# Patient Record
Sex: Female | Born: 1985 | Race: Black or African American | Hispanic: No | Marital: Single | Smoking: Never smoker
Health system: Southern US, Community
[De-identification: ages and names within clinical notes are randomized; demographics above are authoritative.]

## PROBLEM LIST (undated history)

## (undated) DIAGNOSIS — G40812 Lennox-Gastaut syndrome, not intractable, without status epilepticus: Secondary | ICD-10-CM

## (undated) DIAGNOSIS — G809 Cerebral palsy, unspecified: Secondary | ICD-10-CM

## (undated) DIAGNOSIS — IMO0001 Reserved for inherently not codable concepts without codable children: Secondary | ICD-10-CM

## (undated) DIAGNOSIS — R569 Unspecified convulsions: Secondary | ICD-10-CM

## (undated) DIAGNOSIS — F73 Profound intellectual disabilities: Secondary | ICD-10-CM

## (undated) DIAGNOSIS — K219 Gastro-esophageal reflux disease without esophagitis: Secondary | ICD-10-CM

## (undated) DIAGNOSIS — E039 Hypothyroidism, unspecified: Secondary | ICD-10-CM

## (undated) DIAGNOSIS — J189 Pneumonia, unspecified organism: Secondary | ICD-10-CM

## (undated) DIAGNOSIS — E550 Rickets, active: Secondary | ICD-10-CM

## (undated) HISTORY — DX: Lennox-Gastaut syndrome, not intractable, without status epilepticus: G40.812

## (undated) HISTORY — PX: FOOT MASS EXCISION: SHX1663

## (undated) HISTORY — PX: PEG TUBE PLACEMENT: SUR1034

## (undated) HISTORY — DX: Hypothyroidism, unspecified: E03.9

## (undated) HISTORY — DX: Gastro-esophageal reflux disease without esophagitis: K21.9

## (undated) HISTORY — PX: PEG TUBE REMOVAL: SHX2187

## (undated) HISTORY — DX: Cerebral palsy, unspecified: G80.9

## (undated) HISTORY — DX: Reserved for inherently not codable concepts without codable children: IMO0001

## (undated) HISTORY — DX: Rickets, active: E55.0

## (undated) HISTORY — DX: Unspecified convulsions: R56.9

## (undated) HISTORY — DX: Profound intellectual disabilities: F73

---
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
## 2006-01-10 ENCOUNTER — Ambulatory Visit: Payer: Self-pay | Admitting: Surgery

## 2006-02-20 ENCOUNTER — Ambulatory Visit: Payer: Self-pay | Admitting: Surgery

## 2006-02-21 ENCOUNTER — Ambulatory Visit (HOSPITAL_COMMUNITY): Admission: RE | Admit: 2006-02-21 | Discharge: 2006-02-21 | Payer: Self-pay | Admitting: Surgery

## 2006-02-21 ENCOUNTER — Encounter (INDEPENDENT_AMBULATORY_CARE_PROVIDER_SITE_OTHER): Payer: Self-pay | Admitting: Specialist

## 2006-03-07 ENCOUNTER — Ambulatory Visit: Payer: Self-pay | Admitting: Surgery

## 2006-03-16 ENCOUNTER — Emergency Department (HOSPITAL_COMMUNITY): Admission: EM | Admit: 2006-03-16 | Discharge: 2006-03-16 | Payer: Self-pay | Admitting: Emergency Medicine

## 2006-03-21 ENCOUNTER — Ambulatory Visit: Payer: Self-pay | Admitting: Surgery

## 2006-04-19 ENCOUNTER — Ambulatory Visit: Payer: Self-pay | Admitting: Surgery

## 2006-04-23 ENCOUNTER — Ambulatory Visit (HOSPITAL_COMMUNITY): Admission: RE | Admit: 2006-04-23 | Discharge: 2006-04-23 | Payer: Self-pay | Admitting: Surgery

## 2006-05-15 ENCOUNTER — Emergency Department (HOSPITAL_COMMUNITY): Admission: EM | Admit: 2006-05-15 | Discharge: 2006-05-15 | Payer: Self-pay | Admitting: Emergency Medicine

## 2006-05-16 ENCOUNTER — Ambulatory Visit: Payer: Self-pay | Admitting: Surgery

## 2006-05-18 ENCOUNTER — Emergency Department (HOSPITAL_COMMUNITY): Admission: EM | Admit: 2006-05-18 | Discharge: 2006-05-18 | Payer: Self-pay | Admitting: Family Medicine

## 2006-07-03 ENCOUNTER — Emergency Department (HOSPITAL_COMMUNITY): Admission: EM | Admit: 2006-07-03 | Discharge: 2006-07-03 | Payer: Self-pay | Admitting: Emergency Medicine

## 2006-07-26 ENCOUNTER — Emergency Department (HOSPITAL_COMMUNITY): Admission: EM | Admit: 2006-07-26 | Discharge: 2006-07-26 | Payer: Self-pay | Admitting: Emergency Medicine

## 2006-08-03 ENCOUNTER — Emergency Department (HOSPITAL_COMMUNITY): Admission: EM | Admit: 2006-08-03 | Discharge: 2006-08-03 | Payer: Self-pay | Admitting: Emergency Medicine

## 2006-08-05 ENCOUNTER — Emergency Department (HOSPITAL_COMMUNITY): Admission: EM | Admit: 2006-08-05 | Discharge: 2006-08-05 | Payer: Self-pay | Admitting: Emergency Medicine

## 2006-08-07 ENCOUNTER — Ambulatory Visit (HOSPITAL_COMMUNITY): Admission: RE | Admit: 2006-08-07 | Discharge: 2006-08-07 | Payer: Self-pay | Admitting: General Surgery

## 2006-10-23 ENCOUNTER — Ambulatory Visit: Payer: Self-pay | Admitting: General Surgery

## 2006-10-30 ENCOUNTER — Ambulatory Visit: Payer: Self-pay | Admitting: General Surgery

## 2007-02-12 ENCOUNTER — Emergency Department (HOSPITAL_COMMUNITY): Admission: EM | Admit: 2007-02-12 | Discharge: 2007-02-12 | Payer: Self-pay | Admitting: *Deleted

## 2007-04-16 ENCOUNTER — Ambulatory Visit: Payer: Self-pay | Admitting: General Surgery

## 2007-04-17 ENCOUNTER — Emergency Department (HOSPITAL_COMMUNITY): Admission: EM | Admit: 2007-04-17 | Discharge: 2007-04-17 | Payer: Self-pay | Admitting: Emergency Medicine

## 2007-08-12 ENCOUNTER — Emergency Department (HOSPITAL_COMMUNITY): Admission: EM | Admit: 2007-08-12 | Discharge: 2007-08-12 | Payer: Self-pay | Admitting: Emergency Medicine

## 2008-02-13 ENCOUNTER — Emergency Department (HOSPITAL_COMMUNITY): Admission: EM | Admit: 2008-02-13 | Discharge: 2008-02-13 | Payer: Self-pay | Admitting: Emergency Medicine

## 2008-06-01 ENCOUNTER — Emergency Department (HOSPITAL_COMMUNITY): Admission: EM | Admit: 2008-06-01 | Discharge: 2008-06-01 | Payer: Self-pay | Admitting: Emergency Medicine

## 2008-06-22 ENCOUNTER — Ambulatory Visit: Payer: Self-pay | Admitting: General Surgery

## 2008-06-23 ENCOUNTER — Encounter: Payer: Self-pay | Admitting: Family Medicine

## 2008-07-14 ENCOUNTER — Emergency Department (HOSPITAL_COMMUNITY): Admission: EM | Admit: 2008-07-14 | Discharge: 2008-07-14 | Payer: Self-pay | Admitting: Emergency Medicine

## 2009-01-19 ENCOUNTER — Encounter: Payer: Self-pay | Admitting: Family Medicine

## 2009-01-29 ENCOUNTER — Ambulatory Visit: Payer: Self-pay | Admitting: Family Medicine

## 2009-01-29 DIAGNOSIS — F73 Profound intellectual disabilities: Secondary | ICD-10-CM | POA: Insufficient documentation

## 2009-01-29 DIAGNOSIS — K219 Gastro-esophageal reflux disease without esophagitis: Secondary | ICD-10-CM | POA: Insufficient documentation

## 2009-01-29 DIAGNOSIS — G40909 Epilepsy, unspecified, not intractable, without status epilepticus: Secondary | ICD-10-CM

## 2009-01-29 DIAGNOSIS — G809 Cerebral palsy, unspecified: Secondary | ICD-10-CM

## 2009-01-29 DIAGNOSIS — E039 Hypothyroidism, unspecified: Secondary | ICD-10-CM

## 2009-01-29 DIAGNOSIS — M810 Age-related osteoporosis without current pathological fracture: Secondary | ICD-10-CM

## 2009-02-04 ENCOUNTER — Encounter: Payer: Self-pay | Admitting: Family Medicine

## 2009-03-18 ENCOUNTER — Telehealth: Payer: Self-pay | Admitting: Family Medicine

## 2009-03-19 ENCOUNTER — Encounter: Payer: Self-pay | Admitting: Family Medicine

## 2009-03-23 ENCOUNTER — Emergency Department (HOSPITAL_COMMUNITY): Admission: EM | Admit: 2009-03-23 | Discharge: 2009-03-23 | Payer: Self-pay | Admitting: Family Medicine

## 2009-03-23 ENCOUNTER — Emergency Department (HOSPITAL_COMMUNITY): Admission: EM | Admit: 2009-03-23 | Discharge: 2009-03-24 | Payer: Self-pay | Admitting: Emergency Medicine

## 2009-03-24 ENCOUNTER — Encounter: Payer: Self-pay | Admitting: *Deleted

## 2009-03-29 ENCOUNTER — Ambulatory Visit: Payer: Self-pay | Admitting: General Surgery

## 2009-03-31 ENCOUNTER — Encounter: Payer: Self-pay | Admitting: *Deleted

## 2009-04-24 ENCOUNTER — Emergency Department (HOSPITAL_COMMUNITY): Admission: EM | Admit: 2009-04-24 | Discharge: 2009-04-24 | Payer: Self-pay | Admitting: Emergency Medicine

## 2009-04-28 ENCOUNTER — Ambulatory Visit: Payer: Self-pay | Admitting: Family Medicine

## 2009-04-28 ENCOUNTER — Encounter: Payer: Self-pay | Admitting: Family Medicine

## 2009-04-28 LAB — CONVERTED CEMR LAB: TSH: 0.854 microintl units/mL (ref 0.350–4.500)

## 2009-04-29 ENCOUNTER — Telehealth: Payer: Self-pay | Admitting: Family Medicine

## 2009-05-17 ENCOUNTER — Encounter: Payer: Self-pay | Admitting: Family Medicine

## 2009-05-17 ENCOUNTER — Ambulatory Visit: Payer: Self-pay | Admitting: Family Medicine

## 2009-05-20 ENCOUNTER — Telehealth: Payer: Self-pay | Admitting: Family Medicine

## 2009-05-25 ENCOUNTER — Encounter: Payer: Self-pay | Admitting: Family Medicine

## 2009-05-26 ENCOUNTER — Ambulatory Visit: Payer: Self-pay | Admitting: Family Medicine

## 2009-05-26 ENCOUNTER — Encounter: Payer: Self-pay | Admitting: Family Medicine

## 2009-05-26 ENCOUNTER — Encounter (INDEPENDENT_AMBULATORY_CARE_PROVIDER_SITE_OTHER): Payer: Self-pay | Admitting: *Deleted

## 2009-07-22 ENCOUNTER — Ambulatory Visit: Payer: Self-pay | Admitting: Family Medicine

## 2009-07-22 ENCOUNTER — Encounter: Payer: Self-pay | Admitting: Family Medicine

## 2009-07-23 LAB — CONVERTED CEMR LAB: TSH: 0.803 microintl units/mL (ref 0.350–4.500)

## 2009-09-10 ENCOUNTER — Encounter: Payer: Self-pay | Admitting: Family Medicine

## 2009-09-20 ENCOUNTER — Emergency Department (HOSPITAL_COMMUNITY): Admission: EM | Admit: 2009-09-20 | Discharge: 2009-09-21 | Payer: Self-pay | Admitting: Emergency Medicine

## 2009-09-20 ENCOUNTER — Encounter: Payer: Self-pay | Admitting: Family Medicine

## 2009-09-20 ENCOUNTER — Encounter: Payer: Self-pay | Admitting: *Deleted

## 2009-09-20 ENCOUNTER — Ambulatory Visit: Payer: Self-pay | Admitting: Family Medicine

## 2009-09-21 ENCOUNTER — Telehealth: Payer: Self-pay | Admitting: Family Medicine

## 2009-09-21 ENCOUNTER — Ambulatory Visit: Payer: Self-pay | Admitting: Family Medicine

## 2009-09-21 ENCOUNTER — Encounter: Payer: Self-pay | Admitting: Family Medicine

## 2009-09-23 ENCOUNTER — Encounter: Payer: Self-pay | Admitting: Family Medicine

## 2009-09-23 ENCOUNTER — Ambulatory Visit: Payer: Self-pay | Admitting: Family Medicine

## 2009-09-27 ENCOUNTER — Encounter: Admission: RE | Admit: 2009-09-27 | Discharge: 2009-09-27 | Payer: Self-pay | Admitting: General Surgery

## 2009-09-27 ENCOUNTER — Ambulatory Visit: Payer: Self-pay | Admitting: General Surgery

## 2009-10-04 ENCOUNTER — Encounter: Payer: Self-pay | Admitting: Family Medicine

## 2009-10-05 ENCOUNTER — Encounter: Payer: Self-pay | Admitting: Family Medicine

## 2009-10-23 ENCOUNTER — Emergency Department (HOSPITAL_COMMUNITY): Admission: EM | Admit: 2009-10-23 | Discharge: 2009-10-23 | Payer: Self-pay | Admitting: Emergency Medicine

## 2009-10-25 ENCOUNTER — Emergency Department (HOSPITAL_COMMUNITY): Admission: EM | Admit: 2009-10-25 | Discharge: 2009-10-25 | Payer: Self-pay | Admitting: Emergency Medicine

## 2009-11-01 ENCOUNTER — Ambulatory Visit: Payer: Self-pay | Admitting: General Surgery

## 2009-11-08 ENCOUNTER — Encounter: Payer: Self-pay | Admitting: Family Medicine

## 2009-11-16 ENCOUNTER — Ambulatory Visit: Payer: Self-pay | Admitting: Family Medicine

## 2009-11-16 ENCOUNTER — Encounter: Payer: Self-pay | Admitting: Family Medicine

## 2009-11-16 DIAGNOSIS — K9289 Other specified diseases of the digestive system: Secondary | ICD-10-CM | POA: Insufficient documentation

## 2009-11-16 DIAGNOSIS — K929 Disease of digestive system, unspecified: Secondary | ICD-10-CM | POA: Insufficient documentation

## 2009-11-30 ENCOUNTER — Encounter: Payer: Self-pay | Admitting: Family Medicine

## 2009-12-13 ENCOUNTER — Ambulatory Visit: Payer: Self-pay | Admitting: General Surgery

## 2010-01-13 ENCOUNTER — Emergency Department (HOSPITAL_COMMUNITY): Admission: EM | Admit: 2010-01-13 | Discharge: 2010-01-13 | Payer: Self-pay | Admitting: Emergency Medicine

## 2010-01-31 ENCOUNTER — Ambulatory Visit: Payer: Self-pay | Admitting: General Surgery

## 2010-02-25 ENCOUNTER — Encounter: Payer: Self-pay | Admitting: Family Medicine

## 2010-02-25 DIAGNOSIS — R32 Unspecified urinary incontinence: Secondary | ICD-10-CM | POA: Insufficient documentation

## 2010-02-25 DIAGNOSIS — R159 Full incontinence of feces: Secondary | ICD-10-CM | POA: Insufficient documentation

## 2010-03-04 ENCOUNTER — Ambulatory Visit: Payer: Self-pay | Admitting: Family Medicine

## 2010-03-10 ENCOUNTER — Encounter: Payer: Self-pay | Admitting: *Deleted

## 2010-04-04 ENCOUNTER — Ambulatory Visit: Payer: Self-pay | Admitting: Family Medicine

## 2010-04-04 ENCOUNTER — Encounter: Payer: Self-pay | Admitting: Family Medicine

## 2010-04-14 ENCOUNTER — Telehealth: Payer: Self-pay | Admitting: Family Medicine

## 2010-05-19 ENCOUNTER — Encounter: Payer: Self-pay | Admitting: Family Medicine

## 2010-06-19 HISTORY — PX: OTHER SURGICAL HISTORY: SHX169

## 2010-07-09 ENCOUNTER — Encounter: Payer: Self-pay | Admitting: Pediatrics

## 2010-07-10 ENCOUNTER — Encounter: Payer: Self-pay | Admitting: Pediatrics

## 2010-07-21 NOTE — Miscellaneous (Signed)
Summary: Medical Equipment request-incontinence supplies  Clinical Lists Changes  Problems: Added new problem of FULL INCONTINENCE OF FECES (ICD-787.60) Added new problem of URINARY INCONTINENCE (ICD-788.30) Observations: Added new observation of HPI: Called patient's mother and discussed having her in for a physical exam ASAP. 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 This is not a new problem. Forms will be faxed today.  (02/25/2010 11:13) Added new observation of PRIMARY MD: Lloyd Huger MD (02/25/2010 11:13)

## 2010-07-21 NOTE — Consult Note (Signed)
Summary: [facility]   Imported By: De Nurse 05/25/2010 15:26:07  _____________________________________________________________________  External Attachment:    Type:   Image     Comment:   External Document

## 2010-07-21 NOTE — Miscellaneous (Signed)
Summary: add lactulose to med list  Clinical Lists Changes  Medications: Added new medication of LACTULOSE 10 GM/15ML SOLN (LACTULOSE) 2 teaspoonful per tube daily for constipation - Signed Rx of LACTULOSE 10 GM/15ML SOLN (LACTULOSE) 2 teaspoonful per tube daily for constipation;  #240 mL x 11;  Signed;  Entered by: Asher Muir MD;  Authorized by: Asher Muir MD;  Method used: Electronically to [facility] [id number], [place], [place], [place]  [address], Ph: [phone], Fax: [phone]    Prescriptions: LACTULOSE 10 GM/15ML SOLN (LACTULOSE) 2 teaspoonful per tube daily for constipation  #240 mL x 11   Entered and Authorized by:   Asher Muir MD   Signed by:   Asher Muir MD on 09/10/2009   Method used:   Electronically to        [facility] [id number] (retail)       [address] [place], [place]  [address]       Ph: [phone]       Fax: [phone]   RxID:   [id number]

## 2010-07-21 NOTE — Miscellaneous (Signed)
Summary: prior auth nexium   Clinical Lists Changes prior auth form to pcp chart box. medicaid requires questions answered before they will approve.Golden Circle RN  March 10, 2010 2:34 PM fgaxed to [facility].Golden Circle RN  March 11, 2010 9:21 AM  Appended Document: prior auth nexium  nexium approved & faxed to pt pharmacy

## 2010-07-21 NOTE — Letter (Signed)
Summary: Out of School  [facility]  [address]   [place], [place] [address]   Phone: [phone]  Fax: [phone]    September 20, 2009   Student:  LYNAE PEDERSON    To Whom It May Concern:   For Medical reasons, please excuse the above named student for being late for school for the following dates:  Start:   September 20, 2009     If you need additional information, please feel free to contact our office.   Sincerely,    Loralee Pacas CMA    ****This is a legal document and cannot be tampered with.  Schools are authorized to verify all information and to do so accordingly.

## 2010-07-21 NOTE — Miscellaneous (Signed)
Summary: walkk in  Clinical Lists Changes came in with mom. she has been having skin excoriation around G tube site. went to ED monday & was rx nystatin creme. using & it helps. when it wears off pt screams & has a seizure.  mom states the nystatin powder worked better.  also the tube moves freely in & out of the stoma about 4 inches. placed in work in. she does not have an appt with her GI until next week.Golden Circle RN  September 23, 2009 8:40 AM

## 2010-07-21 NOTE — Assessment & Plan Note (Signed)
Summary: skin @ tubesite/[place]/Damontre Millea   Vital Signs:  Patient profile:   25 year old female Temp:     97.9 degrees F oral Pulse rate:   97 / minute BP sitting:   105 / 71  (left arm) Cuff size:   regular  Vitals Entered By: Gladstone Pih (September 23, 2009 8:49 AM) CC: C/O G tube site ired and sore Michele [name] Is Patient Diabetic? No   Primary Care Provider:  Asher Muir MD  CC:  C/O G tube site ired and sore X2weeks.  History of Present Illness: 1.  g-tube problems--past 2 weeks.  tube is loose and gastric secretions are leaking out and irritating the skin.  went to ER and given nystatin and vicodin.  then came to [facility] yesterday.  nystatin contninued.  however, her skin continues to get worse.  part of the problem is that her regular caregiver at her day care center is off and the other caregivers have not been changing the dressings regularly.  no fevers, vomitting or other systemic symptoms.  does seem to be having some pain from the skin irritation.  mom gave her a vicodin, which made her overly sedated.  2.  thyroid--off of synthroid for 2 months.  tsh drawn earlier this week was normal  Habits & Providers  Alcohol-Tobacco-Diet     Tobacco Status: never  Current Medications (verified): 1)  Topamax Sprinkle 25 Mg Cpsp (Topiramate) .... 6 Tablets By Mouth Two Times A Day For Seizures Per Dr. Sharene Skeans 2)  Depakote Sprinkles 125 Mg Cpsp (Divalproex Sodium) .... 6 Tabs in Am and 8 Tabs in Pm For Seizures Per Dr. Sharene Skeans 3)  Zyrtec Childrens Allergy 1 Mg/ml Syrp (Cetirizine Hcl) .... 2 Teaspoons At Bedtime Per Tube For Allergies 4)  Keppra 100 Mg/ml Soln (Levetiracetam) .... 2 Teaspoons Per Tube Two Times A Day For Seizures Per Dr Sharene Skeans 5)  Carnitor 1 Gm/19ml Soln (Levocarnitine) .... 2 Teaspoons Per Tube Three Times A Day 6)  Folbic 2.5-25-2 Mg Tabs (Fa-Pyridoxine-Cyancobalamin) .Marland Kitchen.. 1 Tab Per Tube Daily 7)  Pedi-Dri 100000 Unit/gm Powd (Nystatin) .... Apply To Affected Area  Three Times A Day As Needed Rash Around G Tube 8)  Synthroid 25 Mcg Tabs (Levothyroxine Sodium) .Marland Kitchen.. 1 Tab Per Tube Daily For Thyroid 9)  Clotrimazole-Betamethasone 1-0.05 % Crea (Clotrimazole-Betamethasone) .... Apply To Rash On Neck Prn 10)  Fosamax 70 Mg Tabs (Alendronate Sodium) .Marland Kitchen.. 1 Tab Per Tube Weekly For Bones 11)  Vitamin D 400 Unit Caps (Cholecalciferol) .... 2 Tabs By Mouth Daily (Otc) 12)  Osmolite 1.2 Cal  Liqd (Nutritional Supplements) .... 7 Cans Daily For Nutrition 13)  Nexium 40 Mg Pack (Esomeprazole Magnesium) .Marland Kitchen.. 1 Packet Per Tube Daily For Stomach 14)  Lactulose 10 Gm/64ml Soln (Lactulose) .... 2 Teaspoonful Per Tube Daily For Constipation 15)  Nystatin 100000 Unit/gm Crea (Nystatin) .... Apply To Affected Area Three Times A Day  Allergies: 1)  ! Tegretol  Review of Systems  The patient denies anorexia, fever, and weight loss.    Physical Exam  General:  Well-developed,well-nourished,in no acute distress; alert,appropriate and cooperative throughout examination.  Pt does follow commands during exam.  Able to move from wheel chair to exam table with assistance from mom.  Abdomen:  skin around g-tube site is irritated and red.  no warmth.  no significant tenderness.  stoma site intact, but tube readily pulls out of site about 3 inches.   Additional Exam:  vital signs reviewed    Impression &  Recommendations:  Problem # 1:  SKIN RASH (ICD-782.1) Assessment Deteriorated think this is primarily skin irritation from gastric secretions.  not warm.  culture taken.  start stomapaste.  follow up with GI  in 4 days.  Stop vicodin.  use tylenol or ibuprofen for pain.  continue ppi Her updated medication list for this problem includes:    Pedi-dri 100000 Unit/gm Powd (Nystatin) .Marland Kitchen... Apply to affected area three times a day as needed rash around g tube    Clotrimazole-betamethasone 1-0.05 % Crea (Clotrimazole-betamethasone) .Marland Kitchen... Apply to rash on neck prn    Nystatin 100000  Unit/gm Crea (Nystatin) .Marland Kitchen... Apply to affected area three times a day  Orders: Culture, Wound -[facility] (19[address]) [facility]- Est Level  3 (82956)  Problem # 2:  HYPOTHYROIDISM (ICD-244.9) Assessment: Unchanged normal TSH off of synthroid.  recheck in 2 months The following medications were removed from the medication list:    Synthroid 25 Mcg Tabs (Levothyroxine sodium) .Marland Kitchen... 1 tab per tube daily for thyroid  Complete Medication List: 1)  Topamax Sprinkle 25 Mg Cpsp (Topiramate) .... 6 tablets by mouth two times a day for seizures per dr. Sharene Skeans 2)  Depakote Sprinkles 125 Mg Cpsp (Divalproex sodium) .... 6 tabs in am and 8 tabs in pm for seizures per dr. Sharene Skeans 3)  Zyrtec Childrens Allergy 1 Mg/ml Syrp (Cetirizine hcl) .... 2 teaspoons at bedtime per tube for allergies 4)  Keppra 100 Mg/ml Soln (Levetiracetam) .... 2 teaspoons per tube two times a day for seizures per dr Sharene Skeans 5)  Carnitor 1 Gm/60ml Soln (Levocarnitine) .... 2 teaspoons per tube three times a day 6)  Folbic 2.5-25-2 Mg Tabs (Fa-pyridoxine-cyancobalamin) .Marland Kitchen.. 1 tab per tube daily 7)  Pedi-dri 100000 Unit/gm Powd (Nystatin) .... Apply to affected area three times a day as needed rash around g tube 8)  Clotrimazole-betamethasone 1-0.05 % Crea (Clotrimazole-betamethasone) .... Apply to rash on neck prn 9)  Fosamax 70 Mg Tabs (Alendronate sodium) .Marland Kitchen.. 1 tab per tube weekly for bones 10)  Vitamin D 400 Unit Caps (Cholecalciferol) .... 2 tabs by mouth daily (otc) 11)  Osmolite 1.2 Cal Liqd (Nutritional supplements) .... 7 cans daily for nutrition 12)  Nexium 40 Mg Pack (Esomeprazole magnesium) .Marland Kitchen.. 1 packet per tube daily for stomach 13)  Lactulose 10 Gm/38ml Soln (Lactulose) .... 2 teaspoonful per tube daily for constipation 14)  Nystatin 100000 Unit/gm Crea (Nystatin) .... Apply to affected area three times a day 15)  Stomahesive Paste Pste (Ostomy supplies) .... Apply to skin around g-tube two times a day; dispense one large  tube  Patient Instructions: 1)  It was nice to see you today. 2)  For Korri's stoma, use the stoma paste I prescribed her.  You can keep using the nystatin cream until you get the stomapaste; then stop. 3)  I will call you with the results of the culture. 4)  Please schedule a follow-up appointment in June. Prescriptions: STOMAHESIVE PASTE  PSTE (OSTOMY SUPPLIES) apply to skin around G-tube two times a day; dispense one large tube  #1 x 11   Entered and Authorized by:   Asher Muir MD   Signed by:   Asher Muir MD on 09/23/2009   Method used:   Print then Give to Patient   RxID:   [id number] STOMAHESIVE PASTE  PSTE (OSTOMY SUPPLIES) apply to skin around G-tube two times a day; dispense one large tube  #1 x 11   Entered and Authorized by:   Asher Muir MD  Signed by:   Asher Muir MD on 09/23/2009   Method used:   Electronically to        [facility] [id number] (retail)       [address] [place], [place]  [address]       Ph: [phone]       Fax: [phone]   RxID:   [id number]

## 2010-07-21 NOTE — Progress Notes (Signed)
Summary: Lab Res  Phone Note Call from Patient Call back at Home Phone [phone]   Caller: [name] Summary of Call: Checking on labs for her daughter. Initial call taken by: Clydell Hakim,  April 14, 2010 11:55 AM  Follow-up for Phone Call        will forward to MD. Follow-up by: Theresia Lo RN,  April 14, 2010 11:55 AM  Additional Follow-up for Phone Call Additional follow up Details #1::        Called Michele [name]'s mother to inform her of results. Lab within normal limits. Will continue to follow up as scheduled every 3 months. Additional Follow-up by: Lloyd Huger MD,  April 14, 2010 2:45 PM

## 2010-07-21 NOTE — Miscellaneous (Signed)
Summary: folbic $87  Clinical Lists Changes states she cannot afford the folbic. told her what the components are & told her she can buy them otc for a lot less. to ask pharmacist if she has further questions when buying them.Golden Circle RN  October 04, 2009 4:54 PM

## 2010-07-21 NOTE — Assessment & Plan Note (Signed)
Summary: follow up chronic issues/patient summary   Vital Signs:  Patient profile:   25 year old female Temp:     97.5 degrees F oral Pulse rate:   94 / minute BP sitting:   135 / 62  (left arm) Cuff size:   regular  Vitals Entered By: Gladstone Pih (Nov 16, 2009 8:49 AM) CC: PEG, hypothyroid, osteoporosis Is Patient Diabetic? No Pain Assessment Patient in pain? no      Comments Needs FMLA paperwork completed for mother   Primary Care Provider:  Asher Muir MD  CC:  PEG, hypothyroid, and osteoporosis.  History of Present Illness: 1.  PEG tube issues--seen by GI.  sent to [facility].  PEG tube replaced with a BARD tube.  still draining some.  Went back to see GI for one follow up visit in April (25th).  told to follow up as needed.    2.  hypothyroid--off of thyroid medicine.  due for recheck in October.  3.  osteoporosis--has been on fosamax for 1.5 years.     4.  reflux--on nexium, which has controlled it pretty well  5.  seizures--followed by Dr. Sharene Skeans.  has been having tonic clonic seizures 1-2 times per month.  Has been going to Dr. Sharene Skeans more often to adjust doses/check levels.  on depakote, topamax, keppra.      Habits & Providers  Alcohol-Tobacco-Diet     Passive Smoke Exposure: no  Current Medications (verified): 1)  Topamax Sprinkle 25 Mg Cpsp (Topiramate) .... 6 Tablets By Mouth Two Times A Day For Seizures Per Dr. Sharene Skeans 2)  Depakote Sprinkles 125 Mg Cpsp (Divalproex Sodium) .... 6 Tabs in Am and 8 Tabs in Pm For Seizures Per Dr. Sharene Skeans 3)  Zyrtec Childrens Allergy 1 Mg/ml Syrp (Cetirizine Hcl) .... 2 Teaspoons At Bedtime Per Tube For Allergies 4)  Keppra 100 Mg/ml Soln (Levetiracetam) .... 2 Teaspoons Per Tube Two Times A Day For Seizures Per Dr Sharene Skeans 5)  Carnitor 1 Gm/28ml Soln (Levocarnitine) .... 2 Teaspoons Per Tube Three Times A Day 6)  Folic Acid 1 Mg Tabs (Folic Acid) .Marland Kitchen.. 1 Tab Per Tube Daily 7)  Pedi-Dri 100000 Unit/gm Powd  (Nystatin) .... Apply To Affected Area Three Times A Day As Needed Rash Around G Tube 8)  Clotrimazole-Betamethasone 1-0.05 % Crea (Clotrimazole-Betamethasone) .... Apply To Rash On Neck Prn 9)  Fosamax 70 Mg Tabs (Alendronate Sodium) .Marland Kitchen.. 1 Tab Per Tube Weekly For Bones 10)  Calcium 600-D 600-400 Mg-Unit Tabs (Calcium Carbonate-Vitamin D) .Marland Kitchen.. 1 Tab Per Tube Two Times A Day 11)  Osmolite 1.2 Cal  Liqd (Nutritional Supplements) .... 7 Cans Daily For Nutrition 12)  Nexium 40 Mg Pack (Esomeprazole Magnesium) .Marland Kitchen.. 1 Packet Per Tube Daily For Stomach 13)  Lactulose 10 Gm/108ml Soln (Lactulose) .... 2 Teaspoonful Per Tube Daily For Constipation 14)  Stomahesive Paste  Pste (Ostomy Supplies) .... Apply To Skin Around G-Tube Two Times A Day; Dispense One Large Tube  Allergies: 1)  ! 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  still in normal range several months off of synthroid seizures reflux  Past Surgical History: placement of peg tube 2000; closed in 2003; placed again in 2005 replaced peg tube with BARD April 2011  Social History: lives with mother, sister, and nephew, and cousin.  no smokers.  graduated from [employer].  goes to day program at [employer].  gestures for communication.  few words.  likes to sing.  likes swimming.    Review of Systems  The patient denies fever, weight loss, prolonged cough, and abdominal pain.    Physical Exam  General:  Well-developed,well-nourished,in no acute distress; alert,appropriate and cooperative throughout examination.  Pt does follow commands during exam.   Eyes:  normal appearance Neck:  No deformities, masses, or tenderness noted. Lungs:  Normal respiratory effort, chest expands symmetrically. Lungs are clear to auscultation, no crackles or  wheezes. Heart:  Normal rate and regular rhythm. S1 and S2 normal without gallop, murmur, click, rub or other extra sounds. Abdomen:  slight leakage around g-tube site.  stomapaste applied to site.  pink granulation tissue is seen.  no evidence of yeast.    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  advised continuing the stomapaste and following up with gi as needed   Problem # 2:  HYPOTHYROIDISM (ICD-244.9) Assessment: Unchanged  still low normal tsh off of synthroid.  plan to recheck TSH  in October 2011  Orders: [facility]- Est  Level 4 (40981)  Problem # 3:  OSTEOPOROSIS (ICD-733.00) Assessment: Unchanged  has been on fosamax for osteoporosis for about 1.5 years.  I don't have the original bone density scan results.  she was started on fosamax by her previous physician.  I am not certain how long she should stay on the fosamax.  THis will need to be an ongoing discussion between her and her next provider.  Her updated medication list for this problem includes:    Carnitor 1 Gm/12ml Soln (Levocarnitine) .Marland Kitchen... 2 teaspoons per tube three times a day    Fosamax 70 Mg Tabs (Alendronate sodium) .Marland Kitchen... 1 tab per tube weekly for bones    Calcium 600-d 600-400 Mg-unit Tabs (Calcium carbonate-vitamin d) .Marland Kitchen... 1 tab per tube two times a day  Orders: [facility]- Est  Level 4 (19147)  Problem # 4:  CEREBRAL PALSY (ICD-343.9) Assessment: Unchanged no changes  Problem # 5:  GASTROESOPHAGEAL REFLUX DISEASE (ICD-530.81) Assessment: Unchanged  continue nexium Her updated medication list for this problem includes:    Nexium 40 Mg Pack (Esomeprazole magnesium) .Marland Kitchen... 1 packet per tube daily for stomach  Orders: [facility]- Est  Level 4 (82956)  Problem # 6:  SEIZURE DISORDER  (ICD-780.39) Assessment: Deteriorated management per Dr. Sharene Skeans Her updated medication list for this problem includes:    Topamax Sprinkle 25 Mg Cpsp (Topiramate) .Marland KitchenMarland KitchenMarland KitchenMarland Kitchen 6 tablets by mouth two times a day for seizures per dr. Sharene Skeans    Depakote Sprinkles 125 Mg Cpsp (Divalproex sodium) .Marland KitchenMarland KitchenMarland KitchenMarland Kitchen 6 tabs in am and 8 tabs in pm for seizures per dr. Sharene Skeans    Keppra 100 Mg/ml Soln (Levetiracetam) .Marland Kitchen... 2 teaspoons per tube two times a day for seizures per dr Sharene Skeans  Problem # 7:  MENTAL RETARDATION, PROFOUND (ICD-318.2) Assessment: Unchanged no changes.  goes to day program.  cooperative, but can provide little self-care  Complete Medication List: 1)  Topamax Sprinkle 25 Mg Cpsp (Topiramate) .... 6 tablets by mouth two times a day for seizures per dr. Sharene Skeans 2)  Depakote Sprinkles 125 Mg Cpsp (Divalproex sodium) .... 6 tabs in am and 8 tabs in pm for seizures per dr. Sharene Skeans 3)  Zyrtec Childrens Allergy 1 Mg/ml Syrp (Cetirizine hcl) .... 2 teaspoons at  bedtime per tube for allergies 4)  Keppra 100 Mg/ml Soln (Levetiracetam) .... 2 teaspoons per tube two times a day for seizures per dr Sharene Skeans 5)  Carnitor 1 Gm/6ml Soln (Levocarnitine) .... 2 teaspoons per tube three times a day 6)  Folic Acid 1 Mg Tabs (Folic acid) .Marland Kitchen.. 1 tab per tube daily 7)  Pedi-dri 100000 Unit/gm Powd (Nystatin) .... Apply to affected area three times a day as needed rash around g tube 8)  Clotrimazole-betamethasone 1-0.05 % Crea (Clotrimazole-betamethasone) .... Apply to rash on neck prn 9)  Fosamax 70 Mg Tabs (Alendronate sodium) .Marland Kitchen.. 1 tab per tube weekly for bones 10)  Calcium 600-d 600-400 Mg-unit Tabs (Calcium carbonate-vitamin d) .Marland Kitchen.. 1 tab per tube two times a day 11)  Osmolite 1.2 Cal Liqd (Nutritional supplements) .... 7 cans daily for nutrition 12)  Nexium 40 Mg Pack (Esomeprazole magnesium) .Marland Kitchen.. 1 packet per tube daily for stomach 13)  Lactulose 10 Gm/23ml Soln (Lactulose) .... 2 teaspoonful per tube  daily for constipation 14)  Stomahesive Paste Pste (Ostomy supplies) .... Apply to skin around g-tube two times a day; dispense one large tube  Patient Instructions: 1)  It was nice to see you today. 2)  Pick up the folic acid I prescribed her and starting giving her once daily. 3)  When you run out of vitamin D, replace it with calcium (600mg ) and vitamin (400 units).   4)  Will plan on checking thyroid in October.   5)  Please schedule a follow-up appointment in 3 months .  Prescriptions: CALCIUM 600-D 600-400 MG-UNIT TABS (CALCIUM CARBONATE-VITAMIN D) 1 tab per tube two times a day  #60 x 12   Entered and Authorized by:   Asher Muir MD   Signed by:   Asher Muir MD on 11/16/2009   Method used:   Print then Give to Patient   RxID:   [id number] FOLIC ACID 1 MG TABS (FOLIC ACID) 1 tab per tube daily  #30 x 12   Entered and Authorized by:   Asher Muir MD   Signed by:   Asher Muir MD on 11/16/2009   Method used:   Print then Give to Patient   RxID:   [id number]

## 2010-07-21 NOTE — Assessment & Plan Note (Signed)
Summary: FLU SHOT/[facility]  Nurse Visit   Vital Signs:  Patient profile:   25 year old female Temp:     98 degrees F axillary  Vitals Entered By: Theresia Lo RN (April 04, 2010 9:38 AM)  Allergies: 1)  ! Tegretol  Immunizations Administered:  Influenza Vaccine # 1:    Vaccine Type: Fluvax 3+    Site: left deltoid    Mfr: GlaxoSmithKline    Dose: 0.5 ml    Route: IM    Given by: Theresia Lo RN    Exp. Date: 12/14/2010    Lot #: [id number]    VIS given: 01/11/10 version given April 04, 2010.  Flu Vaccine Consent Questions:    Do you have a history of severe allergic reactions to this vaccine? no    Any prior history of allergic reactions to egg and/or gelatin? no    Do you have a sensitivity to the preservative Thimersol? no    Do you have a past history of Guillan-Barre Syndrome? no    Do you currently have an acute febrile illness? no    Have you ever had a severe reaction to latex? no    Vaccine information given and explained to patient? yes    Are you currently pregnant? no  Orders Added: 1)  Flu Vaccine 70yrs + [90658] 2)  Admin 1st Vaccine [09811]

## 2010-07-21 NOTE — Letter (Signed)
Summary: Generic Letter  [facility]  [address]   [place], [place] [address]   Phone: [phone]  Fax: [phone]    09/23/2009  ADENA SIMA [address] [place], [place]  [address]  To caregivers of [place]:  It is essential that Jaida's G-tube dressings are changed every 2 hours or more frequently if they are moist.  Please call me if you have any questions or concerns.              Sincerely,   Asher Muir MD

## 2010-07-21 NOTE — Miscellaneous (Signed)
Summary: order for diapers/briefs, underpads  completed and faxed to [facility] for briefs/underwear and underpads Ancil Boozer  MD  November 30, 2009 12:32 PM   Clinical Lists Changes

## 2010-07-21 NOTE — Progress Notes (Signed)
Summary: [place] request  Phone Note Call from Patient Call back at [phone]   Caller: Jerilee Field Reason for Call: Talk to Nurse Summary of Call: requesting appt today with Gilbert Manolis to f/u ED visit from yesterday, pt needs pain meds Initial call taken by: Knox Royalty,  September 21, 2009 8:39 AM  Follow-up for Phone Call        spoke with mom. her "tube was messed up" went to [facility] ED. they have cream for skin irritation around her stomach. mom wants pain meds for her as she is in obvious pain. she will bring her now Follow-up by: Golden Circle RN,  September 21, 2009 8:43 AM

## 2010-07-21 NOTE — Assessment & Plan Note (Signed)
Summary: stomach pain-see notes/[place]/[address]   Vital Signs:  Patient profile:   25 year old female Temp:     97.8 degrees F oral Pulse rate:   101 / minute BP sitting:   101 / 70  (right arm) Cuff size:   regular  Vitals Entered By: Garen Grams LPN (September 21, 6299 9:52 AM) CC: STOMACH PAIN Is Patient Diabetic? No   Primary Care Provider:  Asher Muir MD  CC:  STOMACH PAIN.  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  Skin irritation x 1 wk.  Abd pain x 1 day.  Pt attends [facility] daily.  She was fine yesterday until 8 pm when she began to cry.  In the ED pt was Dx with candidiasis around site of peg tube and given nystatin cream to apply to area.   Tolerating normal feedings, no vomiting, no fever, no chills, no change in BM.    Habits & Providers  Alcohol-Tobacco-Diet     Tobacco Status: never  Current Medications (verified): 1)  Topamax Sprinkle 25 Mg Cpsp (Topiramate) .... 6 Tablets By Mouth Two Times A Day For Seizures Per Dr. Sharene Skeans 2)  Depakote Sprinkles 125 Mg Cpsp (Divalproex Sodium) .... 6 Tabs in Am and 8 Tabs in Pm For Seizures Per Dr. Sharene Skeans 3)  Zyrtec Childrens Allergy 1 Mg/ml Syrp (Cetirizine Hcl) .... 2 Teaspoons At Bedtime Per Tube For Allergies 4)  Keppra 100 Mg/ml Soln (Levetiracetam) .... 2 Teaspoons Per Tube Two Times A Day For Seizures Per Dr Sharene Skeans 5)  Carnitor 1 Gm/26ml Soln (Levocarnitine) .... 2 Teaspoons Per Tube Three Times A Day 6)  Folbic 2.5-25-2 Mg Tabs (Fa-Pyridoxine-Cyancobalamin) .Marland Kitchen.. 1 Tab Per Tube Daily 7)  Pedi-Dri 100000 Unit/gm Powd (Nystatin) .... Apply To Affected Area Three Times A Day As Needed Rash Around G Tube 8)  Synthroid 25 Mcg Tabs (Levothyroxine Sodium) .Marland Kitchen.. 1 Tab Per Tube Daily For Thyroid 9)  Clotrimazole-Betamethasone 1-0.05 % Crea  (Clotrimazole-Betamethasone) .... Apply To Rash On Neck Prn 10)  Fosamax 70 Mg Tabs (Alendronate Sodium) .Marland Kitchen.. 1 Tab Per Tube Weekly For Bones 11)  Vitamin D 400 Unit Caps (Cholecalciferol) .... 2 Tabs By Mouth Daily (Otc) 12)  Osmolite 1.2 Cal  Liqd (Nutritional Supplements) .... 7 Cans Daily For Nutrition 13)  Nexium 40 Mg Pack (Esomeprazole Magnesium) .Marland Kitchen.. 1 Packet Per Tube Daily For Stomach 14)  Lactulose 10 Gm/23ml Soln (Lactulose) .... 2 Teaspoonful Per Tube Daily For Constipation 15)  Nystatin 100000 Unit/gm Crea (Nystatin) .... Apply To Affected Area Three Times A Day  Allergies (verified): 1)  ! 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  no smokers.  graduated from [employer].  goes to day program at [employer].  gestures for communication.  few words.  likes to sing.  likes swimming.    Social History: Smoking Status:  never  Review of Systems General:  Denies chills, fever, and loss of appetite. GI:  Complains of abdominal pain; denies bloody stools, change in bowel habits, diarrhea, gas, nausea, and vomiting.  Physical Exam  General:  Well-developed,well-nourished,in no acute distress; alert,appropriate and cooperative throughout examination.  Pt does follow commands during exam.  Able to move from wheel chair to exam table with assistance from mom.  Abdomen:  Bowel sounds positive,abdomen soft and non-tender without masses, organomegaly or hernias noted. Peg tube site:  in  mid abdomen.  There is greenish abd fluid leaking from site (per mom, this is normal).  No pustule.   Area around site with very minimal redness (3-4 cm  to the right of site).   No swelling, no streaking, no erythema.  no distention, no guarding, no rigidity, and no rebound tenderness.     Impression & Recommendations:  Problem # 1:  CANDIDIASIS, SKIN (ICD-112.3) Assessment New Abd exam without guarding, tenderness, peritoneal signs.  Candidiasis around site of peg tube.  Will continue Nystatin that was Rx by ED.  Pt has appt with GI doctor on 09/27/09.  Advised mom to keep appt.  Red flags given to mom to bring pt back to [facility].   Pt given Narco by ED #20 tabs as needed pain.  Pt can continue this.   Complete Medication List: 1)  Topamax Sprinkle 25 Mg Cpsp (Topiramate) .... 6 tablets by mouth two times a day for seizures per dr. Sharene Skeans 2)  Depakote Sprinkles 125 Mg Cpsp (Divalproex sodium) .... 6 tabs in am and 8 tabs in pm for seizures per dr. Sharene Skeans 3)  Zyrtec Childrens Allergy 1 Mg/ml Syrp (Cetirizine hcl) .... 2 teaspoons at bedtime per tube for allergies 4)  Keppra 100 Mg/ml Soln (Levetiracetam) .... 2 teaspoons per tube two times a day for seizures per dr Sharene Skeans 5)  Carnitor 1 Gm/8ml Soln (Levocarnitine) .... 2 teaspoons per tube three times a day 6)  Folbic 2.5-25-2 Mg Tabs (Fa-pyridoxine-cyancobalamin) .Marland Kitchen.. 1 tab per tube daily 7)  Pedi-dri 100000 Unit/gm Powd (Nystatin) .... Apply to affected area three times a day as needed rash around g tube 8)  Synthroid 25 Mcg Tabs (Levothyroxine sodium) .Marland Kitchen.. 1 tab per tube daily for thyroid 9)  Clotrimazole-betamethasone 1-0.05 % Crea (Clotrimazole-betamethasone) .... Apply to rash on neck prn 10)  Fosamax 70 Mg Tabs (Alendronate sodium) .Marland Kitchen.. 1 tab per tube weekly for bones 11)  Vitamin D 400 Unit Caps (Cholecalciferol) .... 2 tabs by mouth daily (otc) 12)  Osmolite 1.2 Cal Liqd (Nutritional supplements) .... 7 cans daily for  nutrition 13)  Nexium 40 Mg Pack (Esomeprazole magnesium) .Marland Kitchen.. 1 packet per tube daily for stomach 14)  Lactulose 10 Gm/32ml Soln (Lactulose) .... 2 teaspoonful per tube daily for constipation 15)  Nystatin 100000 Unit/gm Crea (Nystatin) .... Apply to affected area three times a day  Other Orders: [facility]- Est Level  3 ([address])  Patient Instructions: 1)  Please schedule a follow-up appointment if abdominal pain gets worse, or if there is increased redness, pus, swelling at site of G-tube.  If [place] has fever, please call the [facility]. 2)  Please keep appointment with GI doctor for next Monday.  3)  Keep your scheduled appointment with Dr Lafonda Mosses.

## 2010-07-21 NOTE — Assessment & Plan Note (Signed)
Summary: paperwork to be filled out,tcb   Vital Signs:  Patient profile:   24 year old female Weight:      130 pounds Temp:     97.9 degrees F Pulse rate:   102 / minute BP sitting:   104 / 66  Vitals Entered By: Jone Baseman CMA (March 04, 2010 9:37 AM) CC: fill out forms Is Patient Diabetic? No Pain Assessment Patient in pain? no        Primary Teren Zurcher:  Lloyd Huger MD  CC:  fill out forms.  History of Present Illness: 1. PEG tube still leaking. Being managed by [facility] peds surgery and getting equipment with [facility]. No active or acute issues.  2. Seizures: Dr. Sharene Skeans manages her seizure medications. She has them occaisionally, and mom refrains from having her walk unassisted due to the chance of seizing. Confined to wheel-chair. No changes in her frequency.   3. Incontinence: chronic issue due to inability to control bodily functions or recognize/verbalize toileting needs. Both urinary and bowel, uses diapers chronically. No skin breakdown thus far. Paperwork request for diapers and pads today.   Habits & Providers  Alcohol-Tobacco-Diet     Tobacco Status: never     Passive Smoke Exposure: no  Allergies: 1)  ! 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  still in normal range several months off of synthroid seizures reflux  Past Surgical History: Last updated: 11/16/2009 placement of peg tube 2000; closed in 2003; placed again in 2005 replaced peg tube with BARD April 2011  Family History: Last updated: 01/29/2009 mother--htn father--has defibrillator-->not sure why sister--healthy  Social History: Last updated: 11/16/2009 lives with mother, sister, and nephew, and cousin.  no smokers.  graduated from [employer].  goes to day program  at [employer].  gestures for communication.  few words.  likes to sing.  likes swimming.    Risk Factors: Smoking Status: never (03/04/2010) Passive Smoke Exposure: no (03/04/2010)  Review of Systems  The patient denies fever, weight loss, and weight gain.         Unable to obtain ROS from patient.   Physical Exam  General:  Well-developed,well-nourished,in no acute distress; alert,appropriate and cooperative throughout examination Head:  normocephalic and atraumatic.   Eyes:  PERRLA Ears:  no external deformities.   Nose:  no external deformity.   Mouth:  pharynx pink and moist and fair dentition.   Lungs:  Normal respiratory effort, chest expands symmetrically. Lungs are clear to auscultation, no crackles or wheezes. Heart:  Normal rate and regular rhythm. S1 and S2 normal without gallop, murmur, click, rub or other extra sounds. Abdomen:  Bowel sounds positive,abdomen soft and non-tender without masses, organomegaly or hernias noted. PEG in place with bandages and some mild leakage of fluid.  Msk:  Braces in place on bilateral lower legs. Stands with assistance, unsteady.  Extremities:  No clubbing, cyanosis, edema, or deformity noted with normal full range of motion of all joints.   Neurologic:  cranial nerves II-XII intact and abnormal gait.  Moving all extremeties spontaneously.  Skin:  No decubitus ulcers on sacrum or buttocks. No other rashes noted.    Impression & Recommendations:  Problem # 1:  URINARY INCONTINENCE (ICD-788.30)  Have refilled medical equipment and completed necessary examination for paperwork. This is a chronic problem secondary to profound mental retardation and seizure disorder  and inabilty to recognize toileting needs.   Orders: [facility]- Est Level  3 ([address])  Problem # 2:  Preventive Health Care (ICD-V70.0) Mother asks about flu shot, advised her to call clinic in October when they are available. Note for school [facility] [employer]) given for  today's absence.   Problem # 3:  HYPOTHYROIDISM (ICD-244.9) Had low TSH 3 months ago even after stopping her thyroid replacement medication. Will check again in October, and pt will set up a lab appointment for TSH. Will re-assess level at that time.   Orders: [facility]- Est Level  3 ([address])Future Orders: [facility] ([id number]) ... 09/14/2010  Complete Medication List: 1)  Topamax Sprinkle 25 Mg Cpsp (Topiramate) .... 6 tablets by mouth two times a day for seizures per dr. Sharene Skeans 2)  Depakote Sprinkles 125 Mg Cpsp (Divalproex sodium) .... 6 tabs in am and 8 tabs in pm for seizures per dr. Sharene Skeans 3)  Zyrtec Childrens Allergy 1 Mg/ml Syrp (Cetirizine hcl) .... 2 teaspoons at bedtime per tube for allergies 4)  Keppra 100 Mg/ml Soln (Levetiracetam) .... 2 teaspoons per tube two times a day for seizures per dr Sharene Skeans 5)  Carnitor 1 Gm/32ml Soln (Levocarnitine) .... 2 teaspoons per tube three times a day 6)  Folic Acid 1 Mg Tabs (Folic acid) .Marland Kitchen.. 1 tab per tube daily 7)  Pedi-dri 100000 Unit/gm Powd (Nystatin) .... Apply to affected area three times a day as needed rash around g tube 8)  Clotrimazole-betamethasone 1-0.05 % Crea (Clotrimazole-betamethasone) .... Apply to rash on neck prn 9)  Fosamax 70 Mg Tabs (Alendronate sodium) .Marland Kitchen.. 1 tab per tube weekly for bones 10)  Calcium 600-d 600-400 Mg-unit Tabs (Calcium carbonate-vitamin d) .Marland Kitchen.. 1 tab per tube two times a day 11)  Osmolite 1.2 Cal Liqd (Nutritional supplements) .... 7 cans daily for nutrition 12)  Nexium 40 Mg Pack (Esomeprazole magnesium) .Marland Kitchen.. 1 packet per tube daily for stomach 13)  Lactulose 10 Gm/46ml Soln (Lactulose) .... 2 teaspoonful per tube daily for constipation 14)  Stomahesive Paste Pste (Ostomy supplies) .... Apply to skin around g-tube two times a day; dispense one large tube  Patient Instructions: 1)  Nice to meet you. 2)  Please schedule an appointment for thyroid test and flu shot in October. 3)  I will fax the  equipment order today. 4)  Do no hesitate to call me with any questions or concerns.

## 2010-07-21 NOTE — Letter (Signed)
Summary: FMLA  FMLA   Imported By: Clydell Hakim 11/19/2009 10:29:29  _____________________________________________________________________  External Attachment:    Type:   Image     Comment:   External Document

## 2010-07-21 NOTE — Letter (Signed)
Summary: Out of Work  [facility]  [address]   [place], [place] [address]   Phone: [phone]  Fax: [phone]    September 21, 2009   Employee:  MIKAIAH STOFFER    To Whom It May Concern:   For Medical reasons, please excuse the above named employee from work for the following dates:  Start:   September 21, 2009.   May return to work on September 22, 2009.  If you need additional information, please feel free to contact our office.         Sincerely,    Aubrei Bouchie MD

## 2010-07-21 NOTE — Miscellaneous (Signed)
Summary: certificate of medical necessaity - [employer]  signed for supply kit pump, G Tubes, gloves, tape for tube, syringe with safeTlock and drain sponges.  Ancil Boozer  MD  Nov 08, 2009 8:19 AM  Clinical Lists Changes

## 2010-07-21 NOTE — Letter (Signed)
Summary: Out of School  [facility]  [address]   [place], [place] [address]   Phone: [phone]  Fax: [phone]    September 21, 2009   Student:  SHARDAY MICHL    To Whom It May Concern:   For Medical reasons, please excuse the above named student from school for the following dates:  Start:   September 21, 2009  End:    September 27, 2009.  May return to [employer] on September 27, 2009.   If you need additional information, please feel free to contact our office.   Sincerely,    Neiman Roots MD    ****This is a legal document and cannot be tampered with.  Schools are authorized to verify all information and to do so accordingly.

## 2010-07-21 NOTE — Consult Note (Signed)
Summary: [facility]  [facility]   Imported By: Clydell Hakim 10/12/2009 14:38:17  _____________________________________________________________________  External Attachment:    Type:   Image     Comment:   External Document

## 2010-08-03 ENCOUNTER — Other Ambulatory Visit: Payer: Self-pay | Admitting: *Deleted

## 2010-08-03 DIAGNOSIS — M810 Age-related osteoporosis without current pathological fracture: Secondary | ICD-10-CM

## 2010-08-03 MED ORDER — ALENDRONATE SODIUM 70 MG PO TABS: 70.0000 mg | ORAL_TABLET | ORAL | Status: DC

## 2010-08-03 NOTE — Telephone Encounter (Signed)
mother notified to call to schedule appointment. rx for fosamax sent in for one month

## 2010-08-09 ENCOUNTER — Ambulatory Visit (INDEPENDENT_AMBULATORY_CARE_PROVIDER_SITE_OTHER): Payer: Medicaid Other | Admitting: Family Medicine

## 2010-08-09 ENCOUNTER — Ambulatory Visit (HOSPITAL_COMMUNITY)
Admission: RE | Admit: 2010-08-09 | Discharge: 2010-08-09 | Disposition: A | Discharge status: Home / Self Care | Payer: Medicaid Other | Source: Ambulatory Visit | Attending: Family Medicine | Admitting: Family Medicine

## 2010-08-09 VITALS — BP 110/70 | Temp 98.5°F

## 2010-08-09 DIAGNOSIS — R509 Fever, unspecified: Secondary | ICD-10-CM

## 2010-08-09 DIAGNOSIS — R059 Cough, unspecified: Secondary | ICD-10-CM | POA: Insufficient documentation

## 2010-08-09 DIAGNOSIS — I517 Cardiomegaly: Secondary | ICD-10-CM | POA: Insufficient documentation

## 2010-08-09 DIAGNOSIS — R05 Cough: Secondary | ICD-10-CM | POA: Insufficient documentation

## 2010-08-09 MED ORDER — AMOXICILLIN-POT CLAVULANATE 875-125 MG PO TABS: 1.0000 | ORAL_TABLET | Freq: Two times a day (BID) | ORAL | Status: AC

## 2010-08-09 NOTE — Patient Instructions (Signed)
We are going to get a CXR today and will call you with results.  Start the medicine for treatment of the possible ear infections and possible PNA and upper respiratory infection.  Please contact Dr. Sharene Skeans if she has more seizures.

## 2010-08-09 NOTE — Assessment & Plan Note (Signed)
Difficult to fully assess this patient's lung function due to poor inspriration. She has a low grade fever at home and is on Advil to control it so no temp here today. Her ears appear red but there is some cerumen blocking my view. She may have AOM but may also have PNA (which is what her mom is concerned about since she has such a cough). Will plan to get CXR and treat with Abx that will cover ear infection as well as PNA.

## 2010-08-09 NOTE — Progress Notes (Signed)
  Subjective:    Patient ID: Michele [name], female    DOB: 08/01/1985, 25 y.o.   MRN: [id number]  HPI Fever and Cough: Pt with 5 days of cough and nasal congestion. She had a seizure last week and mom thinks it was because of her temperature being elevated. She has a known seizure disorder and mom treated her seizures like normal and gave her some advil to get her fever down. She is giving her cetirizine syrup in per tube to try to treat the cough since many medicines interact with her seizure meds. She is tolerating her tube feeds well. Normal stools. Temp keep reoccurring up to 100 degrees. She is not acting herself per mom.     Review of Systems Neg except as noted in HPI    Objective:   Physical Exam  Constitutional: She appears well-developed and well-nourished.  HENT:  Head: Normocephalic and atraumatic.       Thick yellow/green mucus coming from nose.  Ears have some cerumen in them bilaterally but bilateral TM's are red   Cardiovascular: Normal rate, regular rhythm and normal heart sounds.   Abdominal: Soft. Bowel sounds are normal. She exhibits no distension.  Neurological:       Pt has cerebral palsy but is awake and alert and interactive with her mom          Assessment & Plan:

## 2010-08-15 ENCOUNTER — Telehealth: Payer: Self-pay | Admitting: Family Medicine

## 2010-08-15 NOTE — Telephone Encounter (Signed)
Wants to speak with MD re: Shirlee Latch

## 2010-08-16 ENCOUNTER — Telehealth: Payer: Self-pay | Admitting: Family Medicine

## 2010-08-16 DIAGNOSIS — R21 Rash and other nonspecific skin eruption: Secondary | ICD-10-CM

## 2010-08-16 MED ORDER — PEDI-DRI 100000 UNIT/GM EX POWD: 100000.0000 [IU] | Freq: Three times a day (TID) | CUTANEOUS | Status: DC | PRN

## 2010-08-16 NOTE — Telephone Encounter (Signed)
Needs to speak with RN re: rx pt was given at last visit, abx caused pt to break out & she needs a powder.

## 2010-08-16 NOTE — Telephone Encounter (Signed)
Mother reports that last week patient was given antibiotic and now she has vaginal itching. She is wanting Nystatin powder sent in to pharmacy.  This is what she has used with success in the past. Pharmacy is [facility] on [address].   Dr. Cristal Moening will be in clinic today so will discuss with her then.    also mother states when she had Xray last week she was told Sundae has an enlarged heart and she has been expecting call form Dr. Cristal Wyrick to discuss. Will also give this message to MD.

## 2010-08-16 NOTE — Telephone Encounter (Signed)
Called [name] and discussed xray results. Mild cardiomegaly an incidental finding on chest film to evaluate for respiratory infection. We discussed following this finding vs. Cardiology referral. No chest pains or known cardiac problems currently. Will discuss further at our next clinic visit.

## 2010-08-23 ENCOUNTER — Ambulatory Visit (INDEPENDENT_AMBULATORY_CARE_PROVIDER_SITE_OTHER): Payer: Medicaid Other | Admitting: Family Medicine

## 2010-08-23 VITALS — BP 100/58 | HR 78 | Temp 98.6°F

## 2010-08-23 DIAGNOSIS — J069 Acute upper respiratory infection, unspecified: Secondary | ICD-10-CM

## 2010-08-23 NOTE — Assessment & Plan Note (Signed)
Resolved without complications, Mother can discuss the enlarged heart found on Xray in more detail with primary MD.  Gave reassurance that this is a measurement and she has no symptoms of heart failure.  That a healthy diet with less fat and sugar will protect her heart more than anything.

## 2010-08-23 NOTE — Progress Notes (Signed)
  Subjective:    Patient ID: MALEIYAH RELEFORD, female    DOB: 06/05/1986, 25 y.o.   MRN: [id number]  HPI Follow up after acute upper respiratory infection and requesting a note to return to her school program.  She has been out since seen on 08/09/10, as her Mother has also been out of work.  Mother received information that her CXR showed an enlarged heart and she is concerned about this.  Does not need any meds refilled today.   Review of Systems  Constitutional: Negative for fever, activity change and appetite change.  HENT: Negative for congestion, rhinorrhea and ear discharge.   Eyes: Negative for discharge.  Respiratory: Negative for cough and wheezing.   Cardiovascular: Negative for leg swelling.       Objective:   Physical Exam  Constitutional:       Profoundly mentally disabled in a wheelchair, ripping apart the paper on the exam table.  HENT:  Right Ear: External ear normal.  Left Ear: External ear normal.  Nose: Nose normal.  Mouth/Throat: Oropharynx is clear and moist. No oropharyngeal exudate.  Cardiovascular: Normal rate and regular rhythm.        No peripheral edema  Pulmonary/Chest: Effort normal and breath sounds normal.  Skin: Skin is warm and dry.          Assessment & Plan:

## 2010-08-23 NOTE — Patient Instructions (Signed)
Return to see Dr. Cristal Bueno as needed

## 2010-08-29 ENCOUNTER — Encounter: Payer: Self-pay | Admitting: *Deleted

## 2010-08-29 ENCOUNTER — Other Ambulatory Visit: Payer: Self-pay | Admitting: *Deleted

## 2010-08-29 DIAGNOSIS — M810 Age-related osteoporosis without current pathological fracture: Secondary | ICD-10-CM

## 2010-08-29 MED ORDER — ALENDRONATE SODIUM 70 MG PO TABS: 70.0000 mg | ORAL_TABLET | ORAL | Status: DC

## 2010-08-29 NOTE — Telephone Encounter (Signed)
This encounter was created in error - please disregard.

## 2010-09-06 LAB — URINALYSIS, ROUTINE W REFLEX MICROSCOPIC
Ketones, ur: 15 mg/dL — AB
Nitrite: NEGATIVE
Specific Gravity, Urine: 1.025 (ref 1.005–1.030)
pH: 7 (ref 5.0–8.0)

## 2010-09-06 LAB — BASIC METABOLIC PANEL
CO2: 19 mEq/L (ref 19–32)
Chloride: 116 mEq/L — ABNORMAL HIGH (ref 96–112)
Creatinine, Ser: 0.79 mg/dL (ref 0.4–1.2)
GFR calc Af Amer: >60 mL/min (ref >60)

## 2010-09-06 LAB — DIFFERENTIAL
Lymphocytes Relative: 30 % (ref 12–46)
Lymphs Abs: 3.4 10*3/uL (ref 0.7–4.0)
Neutrophils Relative %: 60 % (ref 43–77)

## 2010-09-06 LAB — CBC
Platelets: 173 10*3/uL (ref 150–400)
WBC: 11.3 10*3/uL — ABNORMAL HIGH (ref 4.0–10.5)

## 2010-09-06 LAB — VALPROIC ACID LEVEL: Valproic Acid Lvl: 45.4 ug/mL — ABNORMAL LOW (ref 50.0–100.0)

## 2010-09-21 LAB — URINE CULTURE
Colony Count: NO GROWTH
Culture: NO GROWTH

## 2010-09-21 LAB — CBC
HCT: 40.3 % (ref 36.0–46.0)
Platelets: 191 10*3/uL (ref 150–400)
WBC: 15.7 10*3/uL — ABNORMAL HIGH (ref 4.0–10.5)

## 2010-09-21 LAB — DIFFERENTIAL
Basophils Absolute: 0 10*3/uL (ref 0.0–0.1)
Lymphocytes Relative: 10 % — ABNORMAL LOW (ref 12–46)
Lymphs Abs: 1.6 10*3/uL (ref 0.7–4.0)
Neutro Abs: 12.3 10*3/uL — ABNORMAL HIGH (ref 1.7–7.7)
Neutrophils Relative %: 78 % — ABNORMAL HIGH (ref 43–77)

## 2010-09-21 LAB — VALPROIC ACID LEVEL: Valproic Acid Lvl: 41.9 ug/mL — ABNORMAL LOW (ref 50.0–100.0)

## 2010-09-21 LAB — URINALYSIS, ROUTINE W REFLEX MICROSCOPIC
Bilirubin Urine: NEGATIVE
Nitrite: NEGATIVE
Protein, ur: 30 mg/dL — AB
Specific Gravity, Urine: 1.026 (ref 1.005–1.030)
Urobilinogen, UA: 1 mg/dL (ref 0.0–1.0)

## 2010-09-21 LAB — POCT I-STAT, CHEM 8
BUN: 13 mg/dL (ref 6–23)
Chloride: 110 mEq/L (ref 96–112)
HCT: 44 % (ref 36.0–46.0)
Potassium: 4 mEq/L (ref 3.5–5.1)
Sodium: 139 mEq/L (ref 135–145)

## 2010-09-21 LAB — URINE MICROSCOPIC-ADD ON

## 2010-10-27 ENCOUNTER — Telehealth: Payer: Self-pay | Admitting: Family Medicine

## 2010-10-27 NOTE — Telephone Encounter (Signed)
Patients mother dropped off FMLA form to be filled out.  Please call her when completed.

## 2010-10-31 NOTE — Telephone Encounter (Signed)
Placed in Dr. Ernest Haber box.

## 2010-11-01 NOTE — Telephone Encounter (Signed)
Done and ready. I think that her GI physician Dr. Jeremy Johann would have transferred his patient load to a new physician who takes his place. Do not think we need a new referral if this is the case.

## 2010-11-01 NOTE — Telephone Encounter (Signed)
Mom called an informed that paperwork was ready.  She is on her way to pick it up.

## 2010-11-02 ENCOUNTER — Other Ambulatory Visit: Payer: Self-pay | Admitting: Family Medicine

## 2010-11-02 DIAGNOSIS — K9289 Other specified diseases of the digestive system: Secondary | ICD-10-CM

## 2010-11-02 NOTE — Assessment & Plan Note (Signed)
Patient's GI doctor has relocated and no provider is covering his absence. Pt's mother requests transfer to [facility] for surveillance of Sammye's chronic G-tube, GERD, and digestive problems.

## 2010-11-03 ENCOUNTER — Telehealth: Payer: Self-pay | Admitting: Family Medicine

## 2010-11-03 NOTE — Telephone Encounter (Signed)
Mother wants her daughter and herself changed to a different PCP.  She says that the dr is not available enough for her.  Please call her.  Cell number [phone]

## 2010-11-03 NOTE — Telephone Encounter (Signed)
Pt's mom Vivien Presto came by today, she wants excuse notes for pt's visit from January to present. Pls call when paper is ready.

## 2010-11-04 ENCOUNTER — Telehealth: Payer: Self-pay | Admitting: *Deleted

## 2010-11-04 NOTE — Op Note (Signed)
NAME:  Michele [name], Michele [name]                  ACCOUNT NO.:  [id number]   MEDICAL RECORD NO.:  [id number]          PATIENT TYPE:  AMB   LOCATION:  SDS                          FACILITY:  [facility]   PHYSICIAN:  Prabhakar D. Pendse, M.D.DATE OF BIRTH:  02/26/1986   DATE OF PROCEDURE:  02/21/2006  DATE OF DISCHARGE:                                 OPERATIVE REPORT   PREOPERATIVE DIAGNOSES:  1. 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  2. Cerebral palsy.  3. Developmental delay.  4. Status post Nissen fundoplication and gastrostomy.   POSTOPERATIVE DIAGNOSES:  1. 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  2. Cerebral palsy.  3. Developmental delay.  4. Status post Nissen fundoplication and gastrostomy.   OPERATION PERFORMED:  Debridement of gastrostomy site granulation tissue.   SURGEON:  Dr. Levie Heritage.   ASSISTANT:  Nurse.   ANESTHESIA:  Nurse.   OPERATIVE PROCEDURE:  Under satisfactory general anesthesia, the patient in  supine position, the gastrostomy site area was thoroughly prepped and draped  in the usual manner by means of sharp dissection. Debridement of the  infected tissue was carried out. The wound appeared to go through the  fascia; however, it had not compromised the peritoneum. After complete  debridement, the area was irrigated, hemostasis accomplished by  electrocoagulation. Appropriate dressing applied. Throughout the procedure,  the patient's vital signs remained stable.  The patient withstood the  procedure well and was transferred to the recovery room in satisfactory  general condition.           ______________________________  Hyman Bible Levie Heritage, M.D.     PDP/MEDQ  D:  02/21/2006  T:  02/21/2006  Job:  [id number]

## 2010-11-04 NOTE — Op Note (Signed)
[name], Michele [name] NO.:  [id number]   MEDICAL RECORD NO.:  [id number]          PATIENT TYPE:  INP   LOCATION:  6119                         FACILITY:  [facility]   PHYSICIAN:  Jon Gills, M.D.  DATE OF BIRTH:  [birth year]   DATE OF PROCEDURE:  03/28/2004  DATE OF DISCHARGE:                                 OPERATIVE REPORT   PREOPERATIVE DIAGNOSIS:  Dysphagia and poor oral intake.   POSTOPERATIVE DIAGNOSIS:  Dysphagia and poor oral intake.   PROCEDURE:  Upper GI endoscopy with biopsy.   SURGEON:  Jon Gills, M.D.   ASSISTANT:  None.   DESCRIPTION OF PROCEDURE:  Following informed written consent, the patient  was taken to the operating room and placed under general anesthesia with  continuous cardiopulmonary monitoring.  The Olympus endoscope was passed by  mouth and passed through the esophagus, stomach, and duodenum.  No  abnormalities were seen.  Specifically, there was no evidence for any  esophagitis whatsoever.  Multiple biopsies were taken in the duodenum and  esophagus.  A solitary gastric biopsy was obtained to rule out Helicobacter.  The endoscope was gradually withdrawn and the patient was awakened and taken  to the recovery room in satisfactory condition.  She will be transferred  back to her inpatient bed on the sixth floor later today.   TECHNICAL PROCEDURE:  We used Olympus GIF-140 endoscope with cold biopsy  forceps.   SPECIMENS:  Esophagus x3, stomach x1, duodenum x2.       JHC/MEDQ  D:  03/28/2004  T:  03/28/2004  Job:  [id number]

## 2010-11-04 NOTE — Consult Note (Signed)
[name], [name]                  ACCOUNT NO.:  [id number]   MEDICAL RECORD NO.:  [id number]          PATIENT TYPE:  INP   LOCATION:  6119                         FACILITY:  [facility]   PHYSICIAN:  Deanna Artis. Hickling, M.D.DATE OF BIRTH:  1985/11/06   DATE OF CONSULTATION:  03/30/2004  DATE OF DISCHARGE:                                   CONSULTATION   REFERRING PHYSICIAN:  Dr. Orie Rout.   REASON FOR CONSULTATION:  I was asked by Dr. Leotis Shames to see Michele [name] in  followup.  Michele [name] has been in the hospital for 8 days.  She was admitted for  failure to eat, weight loss and unbeknownst to me, coincident failure to  walk.   Michele [name] was initially encephalopathic with a urinary tract infection.  She had  had candidiasis.  My concern was that the patient might have either an  esophageal ulcer, esophageal candidiasis or esophageal herpes, or possibly  gastritis as the same etiology.  She had endoscopy by Dr. Jon Gills  which failed to show any abnormalities whatsoever.   In light of these findings and with her continued failure to eat, I was  asked to reassess her to determine whether or not there might be a central  or peripheral cause that would explain her condition.   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  She has Lennox-Gastaut-type encephalopathy.  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   Since admission to the hospital, Michele [name] seizures have been nearly non-  existent.  She has had a few minor motor seizures, but nothing major.  She  has been getting her medication one way or another.   CURRENT MEDICATIONS:  Her current medication includes:  1.  Foltx 1 per day.  2.  Levothyroxine 25 mcg  per day.  3.  Protonix 40 mg per day.  4.  Valproic acid 500 mg twice daily.  5.  Chronulac 15 mg per day.  6.  Nystatin 1 mL 4 times a day to her mouth and then 5 mL 4 times a day for      her intestine.  7.  Carnitor 500 mg 3 times a day.  8.  Keppra 325 mg twice daily.  9.  Topiramate 150 mg twice daily.  10. Levothyroxine could also be given 50 mcg orally daily rather than IV.  11. Cholecalciferol 400 units twice daily.  12. Calcium carbonate 100 mg/mL -- 5 mL daily.  13. PediaSure with Fiber.  14. P.r.n. medicines with acetaminophen and benzocaine.   I note in my February 2005 note that Michele [name] had had progressive difficulty  with her gait and she had not had any problems with swallowing, and  continued to use her upper extremities; reason for this was unclear.   DRUG ALLERGIES:  TEGRETOL (rash).   INTOLERANCES:  FELBATOL (excessive somnolence).   SOCIAL HISTORY:  The patient lives with her mother and mother's 5-year-old  grandson.  There is a [employer] worker who comes in the afternoon after school  until 9 p.m. and also a half day on Saturday and Sunday.  Mother works third  shift.  Child is in [employer].  She swims regularly.  She  attends vocational rehabilitation and music education.  She has physical  therapy twice a week, occupational therapy as needed, does not have speech  therapy because she is thought to be nonverbal.   PHYSICAL EXAMINATION:  VITAL SIGNS:  On examination today, temperature 36.7,  blood pressure 128/92.  Looking back at her records, she has had a number of  very elevated blood pressures; speaking with the nurses, she becomes very  upset when her blood pressure is taken.  Resting pulse 128.  Respirations  10.  Pulse oximetry 98% on room air.  EARS, NOSE AND THROAT:  No signs of infection.  NECK:  Neck is supple, full range of motion.  LUNGS:  Clear.  HEART:  No murmurs.  Pulses normal.  ABDOMEN:  Soft.  Bowel sounds normal.  Panda tube  is in place.  EXTREMITIES:  Tight heel cords, but no other contractures, no edema or  cyanosis.  NEUROLOGIC EXAMINATION:  Awake, alert.  She resists examination and cries  out loudly.  She has no language.  Cranial nerves:  Round, reactive pupils,  5 to 3 mm.  Fundi show mild disk pallor, but sharp disk margins and normal  venous pulsations.  Symmetric facial strength.  Midline tongue.  She has got  an active gag and grimace.  There is no ptosis, no disconjugate eye  movements.  Motor examination:  The patient withdraws her arms more so than  her legs to noxious stimuli by lifting her arms against gravity and the  distal leg against gravity.  Sensation:  Withdrawal x4.  There is no sensory  level.  Deep tendon reflexes are normal in the upper extremities, diminished  in the lower extremities.  She had bilateral flexor plantar responses.   IMPRESSION:  1.  Dysphagia, 787.2, etiology unknown.  2.  Failure to walk, etiology also unknown.  3.  There is no sign of focal neurologic deficits to suggest a localized      central nervous system lesion.  4.  There is no sign of myelopathy (no sensory level and reflexes would be      brisk in the lower extremities).  5.  The oral structures seem to be working fairly well in terms of gag,      grimace and facial expression.  It seems unlikely that there would be a      brain stem lesion, again because of no focal or global deficits.  6.  Hypertension, 404.10.  7.  Seizures in good control for Michele [name], 345.41, 345.01.   RECOMMENDATIONS:  1.  MRI of the brain without and with contrast under sedation.  She needs to      be sedated well so that we can get a good study.  I think this will be      of low yield.  2.  CPK to evaluate for polymyositis or dermatomyositis.  3.  I do not think that myasthenia gravis is a likely etiology, because she     does not have ptosis or eye movement problems.  There will be no way in      doing a  Tensilon test that we  would be able to determine whether or not      Michele [name] improved.  The only test that could be done would be antibodies to      the neuromuscular junction which are binding, blocking and modulating.  4.  Consider workup of hypertension, although the patient may just be      agitated.  It might be worthwhile to place a blood pressure cuff on her      for the entire morning to see if her blood pressure drops on down when      she is not agitated.   I appreciate the opportunity to participate in her care.  If you have  questions or if I can be of assistance, do not hesitate to contact me.       WHH/MEDQ  D:  03/30/2004  T:  03/30/2004  Job:  [id number]

## 2010-11-04 NOTE — Op Note (Signed)
NAME:  Michele, [name]                  ACCOUNT NO.:  [id number]   MEDICAL RECORD NO.:  [id number]          PATIENT TYPE:  AMB   LOCATION:  ENDO                         FACILITY:  [facility]   PHYSICIAN:  Prabhakar D. Pendse, M.D.DATE OF BIRTH:  01-May-1986   DATE OF PROCEDURE:  04/23/2006  DATE OF DISCHARGE:                                 OPERATIVE REPORT   PREOPERATIVE DIAGNOSES:  1. Nonfunctioning gastrostomy tube.  2. Seizure disorder, developmental delay, and status post gastrostomy.   POSTOPERATIVE DIAGNOSES:  1. Nonfunctioning gastrostomy tube.  2. Seizure disorder, developmental delay, and status post gastrostomy.   OPERATIONS PERFORMED:  1. Removal of old nonfunctioning gastrostomy tube.  2. Placement of new 24-French replacement gastrostomy tube.   SURGEON:  Prabhakar D. Levie Heritage, M.D.   ASSISTANT:  Nurse.   ANESTHESIA:  None.   OPERATIVE PROCEDURES:  With the patient in supine position, the gastrostomy  site was cleansed.  The previously placed nonfunctioning gastrostomy tube  was removed by manipulation.  A new #24-French replacement gastrostomy tube  was placed in.  The balloon was inflated with 15 cc of fluid.  The tube was  irrigated with 50 cc of saline.  No leakage was noted.  Appropriate  instructions were given to the mother regarding the care of the gastrostomy  site, and the patient was discharged, the be followed as an outpatient.           ______________________________  Hyman Bible. Levie Heritage, M.D.     PDP/MEDQ  D:  04/23/2006  T:  04/23/2006  Job:  [id number]

## 2010-11-04 NOTE — Op Note (Signed)
[place]. [facility]  Patient:    Michele [name], Michele [name]                         MRN: [id number] Proc. Date: 12/19/00 Adm. Date:  [id number] Disc. Date: [id number] Attending:  Leonia Corona CC:         Maia Breslow, M.D.   Operative Report  PREOPERATIVE DIAGNOSES: 1. Cerebral palsy. 2. Seizure disorder. 3. Nutritional failure. 4. 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  POSTOPERATIVE DIAGNOSES: 1. Cerebral palsy. 2. Seizure disorder. 3. Nutritional failure. 4. 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  OPERATION:  Repair of gastric fistula.  SURGEON:  Nelida Meuse, M.D.  ASSISTANTDonnella Bi D. Pendse, M.D.  ANESTHESIA:  General mask anesthesia.  INDICATIONS:  This 25 year old female child has had gastrostomy and subsequently a gastric button placed for the nutritional care for several years.  She was able to tolerate oral fluids as the G-button was removed. However, even after several weeks of gastrostomy button removal the gastrotomy I did not heal and continued to leak gastric contents macerating the skin around the side of the abdomen.  Hence, the procedure.  DESCRIPTION OF PROCEDURE:  The patient was brought to the operating room, placed supine on the operating room table.  General laryngeal mask anesthesia is given.  The abdominal wall around the gastric fistula was cleaned, prepped and draped in the usual manner.  An elliptical incision was placed around the gastric fistula deepened to the subcutaneous tissue using electrocautery and the fistulous opening of the gastrostomy was mobilized.  Two stay sutures using 4-0 silk were taken to facilitate the dissection of the gastric fistula. Using sharp scissors, the fistula was dissected into the subcutaneous plane and then in the fascial plane to mobilize the fistula.  The retraction was given with the stay sutures and further dissection was done  until the fascia was separated from the wall of the stomach which was mobilized substantially for about 1 cm circumferentially.  After mobilization of the gastric fistula along with the stomach wall away from the fissure we decided to do no further mobilization and without entering into the general peritoneal cavity.  The gastric fistula was freshened by excising with scissors.  The freshened edges were now held up with hemostats and closed with transverse mattress sutures in a single layer inverting the edges of the gastric fistula.  Five stitches were placed using 2-0 GI silk and a well secured closure of the fistula was obtained.  The partially mobilized stomach wall was very soft and supple which was pushed, and the fascia was closed over it using 2-0 Vicryl interrupted stitches.  After fascial closure, the wound was irrigated and the skin was closed with three single loose stitches using nylon.  Sterile dressing was applied.  The patient tolerated the procedure very well which was smooth and uneventful.  It was reported by the nurse during preparation that the patient was seen to have some passage of stool from the vagina area.  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  This is an incidental finding which will be discussed with the parents for further follow-up and action. DD:  12/19/00 TD:  12/19/00 Job: [id number]

## 2010-11-04 NOTE — Procedures (Signed)
[place]. [facility]  Patient:    Michele [name], Michele [name]                         MRN: [id number] Proc. Date: 01/25/00 Adm. Date:  [id number] Attending:  Doug Sou                           Procedure Report  DATE OF BIRTH:  July 03, 1985.  PREOPERATIVE DIAGNOSIS:  Cerebral palsy with epilepsy with nutritional problems with G-button tube with accidental removal of G-button.  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  OPERATION:  Replacement of G-button with dilatation of the tract.  SURGEON:  Dr. Marja Kays.  DESCRIPTION OF PROCEDURE:  The procedure was performed in the emergency room. This 25 year old female child with cerebral palsy who is feeding through the G-button had an accidental removal of the G-button secondary to the upset of the balloon.  The 18 French 2.5 cm G-button was attempted to be replaced; however, the tract had already narrowed which had required some dilatation which was performed with the help of the hemostat and well-lubricated 18 [place] G-button.  A 2.5 cm length was then replaced without any problems.  The balloon was inflated to 6 cc saline capacity which firmly fitted in the hole without any leakage and was easily flushed and drained into the stomach.  The procedure was tolerated well by the patient.  The patient was later discharged to home with a suction tube and follow-up if necessary.  Standard instructions of G-button care was given to the patient. DD:  01/25/00 TD:  01/26/00 Job: [id number]

## 2010-11-04 NOTE — Op Note (Signed)
[place]. [facility]  Patient:    Michele [name], Michele [name]                         MRN: [id number] Adm. Date:  [id number] Disc. Date: [id number] Attending:  Leonia Corona                           Operative Report  PREOPERATIVE DIAGNOSES: 1. Fallen G button due to rupture of balloon. 2. Cerebral palsy with feeding disorder.  POSTOPERATIVE DIAGNOSES: 1. Fallen G button due to rupture of balloon. 2. Cerebral palsy with feeding disorder.  OPERATION: 1. Dilatation of the gastrotomy tract. 2. Placement of G button.  ANESTHESIA:  Sedation with topical anesthesia.  SURGEON:  Evalee Mutton. Leeanne Mannan, M.D.  ASSISTANT:  Nurse.  PROCEDURE IN DETAIL:  The procedure was performed in the endoscopy suite under sedation and topical anesthesia.  The patient had already received about 30 mg/kg body weight of chloral hydrate p.o. about 45 minutes prior to the procedure, and she was partly sedated and calm and quite.  EMLA cream was applied around the gastrotomy site about an hour ago for local anesthetic effect.  The Foley catheter placed earlier in the emergency room had fallen off due to rupture of the balloon.  The gastrotomy tract was too visible; however, it was not allowing a #18 G button, which was placed in earlier, to be placed without dilatation, hence, the indication of the tract dilatation for which urethral dilators and metal adult-sized dilators were used, starting gradually from the smaller size and up to the #22.  Dilatation was achieved with well lubricated dilators.  After dilatation of the tract up to 22 French ureteral dilator, we introduced the #18 [place] MIC G button 2.5 cm length in the gastrotomy tract and inflated the balloon to 4 cc of saline, which fitted very well and went in easily.  The G button _______ well and aspirated the gastric contents without any difficulty.  After confirming the placement, the G button was closed, and the patient was discharged  with instructions of routine care of the G button.  The patient tolerated the procedure very well, which was smooth and uneventful. DD:  07/25/00 TD:  07/26/00 Job: [id number]

## 2010-11-04 NOTE — Op Note (Signed)
NAME:  Michele [name], Michele [name]                  ACCOUNT NO.:  [id number]   MEDICAL RECORD NO.:  [id number]          PATIENT TYPE:  AMB   LOCATION:  SDS                          FACILITY:  [facility]   PHYSICIAN:  Prabhakar D. Pendse, M.D.DATE OF BIRTH:  April 14, 1986   DATE OF PROCEDURE:  07/31/2005  DATE OF DISCHARGE:                                 OPERATIVE REPORT   PREOPERATIVE DIAGNOSIS:  1.  Nonfunctioning gastrostomy tube, status post Nissen fundoplication and      gastrostomy.  2.  Cerebral palsy and mental retardation.   POSTOPERATIVE DIAGNOSIS:  1.  Nonfunctioning gastrostomy tube, status post Nissen fundoplication and      gastrostomy.  2.  Cerebral palsy and mental retardation.   OPERATION PERFORMED:  1.  Upper endoscopy and removal of old gastrostomy tube.  2.  Placement of new [employer], Endovive PEG tube 20-French.   SURGEON:  Dr. Levie Heritage.   ASSISTANT:  Dr. Leeanne Mannan.   ANESTHESIA:  General.   OPERATIVE PROCEDURE:  Under satisfactory general endotracheal anesthesia,  the patient in supine position. The endoscope was passed through the  hypopharynx, through the esophagus into the stomach and the previously  placed gastrostomy tube end was identified over the anterior wall of the  stomach and grasped with the rat tooth forceps.  Now the outside projecting  gastrostomy tube over the abdominal wall was cut and a guide wire was tied  to the end of the gastrostomy tube and pulled back through the scope and the  scope was withdrawn with the whole assembly.  Thus the end of the  gastrostomy tube was removed together with the guidewire. Now the guidewire  loop was now fixed to the end of the Endovive #28-French PEG tube and by  pulling the guidewire from the anterior abdominal wall, the PEG tube was  pulled out leaving the cuff into the body of the stomach.  At this time  endoscope was again passed and careful inspection was carried out to see  that the new PEG tube was placed in  satisfactory manner.  The outer  projecting end of the PEG tube now was straightened at the appropriate  level.  A stopper disk was placed so as to control the positioning of the  tube and the Y connector was connected to the end of the tube appropriate  dressing applied. The scope was removed after decompressing the stomach and  the procedure terminated. Throughout the procedure the patient's vital signs  remained stable. The patient withstood the procedure well and was  transferred to recovery room in satisfactory general condition.           ______________________________  Hyman Bible Levie Heritage, M.D.    PDP/MEDQ  D:  07/31/2005  T:  07/31/2005  Job:  [id number]

## 2010-11-04 NOTE — Op Note (Signed)
[place]. [facility]  Patient:    Michele [name], Michele [name]                         MRN: [id number] Proc. Date: 08/30/00 Adm. Date:  [id number] Attending:  Howell Pringle                           Operative Report  PREOPERATIVE DIAGNOSIS:  Nonfunctioning gastrostomy tube.  POSTOPERATIVE DIAGNOSIS:  Nonfunctioning gastrostomy tube.  OPERATION PERFORMED: 1. Removal of gastrostomy tube. 2. Placement of new MIC, #18, 2.3 cm stem button. SURGEON:  Prabhakar D. Levie Heritage, M.D.  ASSISTANT:  Nurse.  ANESTHESIA:  Topical EMLA cream.  DESCRIPTION OF PROCEDURE:  Under satisfactory topical EMLA cream anesthesia, gastrostomy site was cleansed.  The previously placed gastrostomy tube was removed and new MIC #18, 2.3 cm stem button was introduced with some manipulation without difficulty.  The balloon was inflated with 5 cc of saline.  The button was irrigated with 50 cc of saline.  There was no leakage noted.  Appropriate instructionns were given to the parents regarding the care of the button and she was discharged to be followed as an outpatient. DD:  08/30/00 TD:  08/30/00 Job: [id number]

## 2010-11-04 NOTE — Op Note (Signed)
NAME:  Michele [name], Michele [name] NO.:  [id number]   MEDICAL RECORD NO.:  [id number]          PATIENT TYPE:  AMB   LOCATION:  ENDO                         FACILITY:  [facility]   PHYSICIAN:  Bunnie Pion, MD   DATE OF BIRTH:  Apr 27, 1986   DATE OF PROCEDURE:  08/07/2006  DATE OF DISCHARGE:  08/07/2006                               OPERATIVE REPORT   PREOPERATIVE DIAGNOSIS:  Need for enteral access.   POSTOPERATIVE DIAGNOSIS:  Need for enteral access.   OPERATION PERFORMED:  Change of gastrostomy tube.   ATTENDING SURGEON:  Bunnie Pion, MD.   DESCRIPTION OF PROCEDURE:  The procedure was done in the endoscopy  suite.  Using a removal device and traction, the Bard button was removed  and replaced with an appropriately sized new device.  There was a  minimal amount of bleeding.  The tube functioned properly.  The patient  was observed in the endoscopy suite and then returned to the care of her  parents.      Bunnie Pion, MD  Electronically Signed     TMW/MEDQ  D:  09/24/2006  T:  09/24/2006  Job:  [id number]

## 2010-11-04 NOTE — Discharge Summary (Signed)
NAMELEILENE, DIPRIMA                  ACCOUNT NO.:  [id number]   MEDICAL RECORD NO.:  [id number]          PATIENT TYPE:  INP   LOCATION:  6119                         FACILITY:  [facility]   PHYSICIAN:  Bonnita Hollow, M.D.DATE OF BIRTH:  11-09-1985   DATE OF ADMISSION:  03/22/2004  DATE OF DISCHARGE:  04/18/2004                                 DISCHARGE SUMMARY   ADDENDUM:   ATTENDING PHYSICIAN:  Orie Rout, M.D.   DISCHARGE MEDICATIONS:  1.  Fosamax 75 mg q. week.  2.  Prevacid 30 mg b.i.d. diluted in two teaspoons of water.  3.  Foltx one tablet daily.  4.  Metoprolol 25 mg b.i.d.  5.  Topamax 150 mg b.i.d.  6.  Keppra 325 mg b.i.d.  7.  Depakote sprinkles 500 mg b.i.d.  8.  Synthroid 0.05 mg daily.  9.  Carnitor 500 mg t.i.d.  10. Cholecalciferol 400 international units b.i.d.  11. Calcium 500 mg b.i.d.  12. Lactulose 15 mL daily p.r.n. constipation.  13. Diastat 15 mg in rectum for seizures that last greater than five      minutes.  14. Augmentin 875 mg b.i.d. for seven days.   Please note that above medications are to be given per PEG tube.   1.  GASTROINTESTINAL:  Jiya was scheduled for discharge on April 17, 2004, however, nurse came to remove staples before discharge and notice      some purulent drainage from left side of wound.  Dr. Levie Heritage was      notified.  The patient's CBC was within normal limits with wbc of 11.0,      blood cultures negative growth to date, wound cultures negative growth      to date.  The patient also received an ultrasound of the abdomen which      revealed no collection along PEG.  The patient's wound was packed and      dressed appropriately.  The patient was afebrile despite drainage from      wound site.  The patient will be discharged home today and follow up      with Dr. Levie Heritage as an outpatient in one week.  We will continue      Augmentin 875 mg b.i.d. for one week.   At discharge, the patient was receiving 200  mm boluses of Avalide 0.2 mmol  at 8 a.m., 12 p.m. and 4 p.m.; each bolus followed by 200 mL of free water  and continuous feeds from the hours of 9 p.m. and 6 a.m.  at 95 mL per hour.  Current plan was discussed with [facility] in addition to  goal feeds.  The patient will need to be reevaluated by Dr. Carlynn Purl as to  adjustment of feeding regimen.   DISPOSITION:  The patient will be discharged today by ambulance to home.  The patient's medication list was provided for mother in addition to  directions for PEG tube placement of medicine.  Most medications were called  into mom's pharmacy [facility] on [address], however,  Foltx, Topamax,  Synthroid, vitamin D, calcium were called in to __________ on [address]  due to need for __________ of the aforestated medications for PEG tube.  The patient will follow up with [facility] and Dr. Carlynn Purl on  Friday, April 22, 2004, at 11:30 a.m. and with Dr. Levie Heritage on Tuesday,  April 26, 2004, at 2:45 p.m. and patient's mother will call Dr. Sharene Skeans  in the next three months as scheduled and appointment for a month after  discharge.       VRE/MEDQ  D:  04/18/2004  T:  04/18/2004  Job:  [id number]   cc:   Orie Rout, M.D.  [address]  [place] [address]  Fax: [phone]

## 2010-11-04 NOTE — Op Note (Signed)
NAME:  [name], [name]                  ACCOUNT NO.:  [id number]   MEDICAL RECORD NO.:  [id number]          PATIENT TYPE:  AMB   LOCATION:  SDS                          FACILITY:  [facility]   PHYSICIAN:  Prabhakar D. Pendse, M.D.DATE OF BIRTH:  1985/07/11   DATE OF PROCEDURE:  09/18/2005  DATE OF DISCHARGE:                                 OPERATIVE REPORT   PREOPERATIVE DIAGNOSIS:  Nonfunctioning gastrostomy tube and cerebral palsy.   POSTOPERATIVE DIAGNOSIS:  Nonfunctioning gastrostomy tube and cerebral  palsy.   OPERATION PERFORMED:  Upper endoscopy with removal of the nonfunctioning PEG  tube and placement of new #22 gastrostomy replacement tube.   SURGEON:  Dr. Levie Heritage   ASSISTANT:  Nurse.   ANESTHESIA:  Nurse.   OPERATIVE INDICATION:  This 25 year old girl, who had recently placement of  #20 PEG tube continued to leak feedings around the gastrostomy tube.  It  caused significant skin damage and difficulty in maintenance.  Because of  the patient's obesity, local management with dry wafers was unsuccessful.  Hence it was decided to remove the PEG and place a new #22 replacement  gastrostomy tube   OPERATIVE PROCEDURE:  Under satisfactory general anesthesia, upper endoscope  was gently passed through the oropharynx into the upper esophagus and  advanced under direct vision and brought out the gastric pouch.  The end of  the gastrostomy tube was identified by external manipulation.           ______________________________  Hyman Bible Levie Heritage, M.D.     PDP/MEDQ  D:  09/18/2005  T:  09/18/2005  Job:  [id number]

## 2010-11-04 NOTE — Op Note (Signed)
NAME:  Michele [name], Michele [name]                  ACCOUNT NO.:  [id number]   MEDICAL RECORD NO.:  [id number]          PATIENT TYPE:  INP   LOCATION:  XRAY                         FACILITY:  [facility]   PHYSICIAN:  Prabhakar D. Pendse, M.D.DATE OF BIRTH:  Jun 27, 1985   DATE OF PROCEDURE:  04/11/2004  DATE OF DISCHARGE:                                 OPERATIVE REPORT   PREOPERATIVE DIAGNOSES:  1.  Feeding difficulties in this 25 year old girl.  2.  Seizure disorder and mental retardation.  3.  Status post Nissen fundoplication and gastrostomy tube at [facility] in 2000.  4.  Closure of gastrostomy in 2002.   POSTOPERATIVE DIAGNOSES:  1.  Feeding difficulties in this 25 year old girl.  2.  Seizure disorder and mental retardation.  3.  Status post Nissen fundoplication and gastrostomy tube at [facility] in 2000.  4.  Closure of gastrostomy in 2002.   PROCEDURE:  1.  Placement of percutaneous endoscopic gastrostomy by pull method,      attempts unsuccessful.  2.  Exploratory laparotomy, lysis of adhesions, and placement of gastrostomy      tube, size Jamaica 20.   SURGEON:  Prabhakar D. Levie Heritage, M.D.   ASSISTANT:  Leonia Corona, M.D.   ANESTHESIA:  Nurse.   DESCRIPTION OF PROCEDURE:  Under satisfactory general endotracheal  anesthesia, the patient in the supine position, abdomen was thoroughly  prepped and draped in the usual manner.  By gentle manipulation, the  endoscope was passed through the hypopharynx into the upper esophagus and  advanced under direct vision through the entire length of the esophagus, GE  junction, the body of the stomach.  Over the distal aspect of the stomach  over the anterior wall, there was scarring due to previous gastrostomy  placement.  The stomach was distended with air and in spite of several  attempts it was extremely difficult to locate the area of the anterior wall  of the stomach due to thickness of the abdominal wall and dark skin.   Slight  illumination was appreciated.  External pressure with the fingers showed the  approximate area corresponding to the anterior gastric wall.  The spinal  needle was used to enter the stomach from the anterior wall.  We were unable  to enter th gastric wall in spite of several attempts.  It was felt that  either there was some intraperitoneal fissure or just the thickness of the  abdominal wall was preventing from entering the stomach.  Hence, the  endoscopic maneuvering was done to inflate the stomach more to perhaps help  placement of the needle into the stomach which was once again was  unsuccessful. The stomach was decompressed and the scope was removed and  decision was made to perform the gastrostomy by open manner.   At this time, abdomen was reprepped and draped in the usual manner, about 4  inches from transverse incision was made and directly over the previous  gastrostomy site, skin and subcutaneous tissue incised.  The abdominal wall  was quite thick on  account of fatty tissue.  The muscles were incised in the  line of the skin incision.  The peritoneal cavity was entered.  There was  marked distention of the colon as well as small bowel preventing from  appropriate exploration.  Hence, two large rectal tubes were placed in the  rectum to decompress part of the colon which was accomplished successfully.  Now, the bowel could be pushed and retracted with again considerable  difficulty.  The stomach was identified, anterior wall exposed.  Stay  sutures were placed.  Two pursestring sutures of 3-0 silk were placed over  the anterior wall.  Gastrostomy was made and size 20 Jamaica gastrostomy tube  was used to place in the stomach.  Two pursestring sutures were tied.  At  this time, the stomach was anchored to the anterior abdominal wall with two  sutures.  Omentum was placed around the gastrostomy site, sponge and needle  counts being correct.  Abdominal cavity was irrigated  and closure was done  with 2-0 Vicryl interrupted through and through sutures.  Satisfactory  closure was accomplished.  Subcutaneous tissue was closed with 3-0 Vicryl.  Skin closed with skin staples.  The gastrostomy tube was anchored with the  plastic gauze and was sutured to the abdominal wall with 2-0 nylon.  Appropriate dressing was applied.  The gastrostomy tube was attached to the  gravity drainage.  Throughout the procedure, the patient's vital signs  remained stable.  The patient withstood the procedure well and was  transferred to the recovery room in guarded general condition.        ___________________________________________  Hyman Bible Levie Heritage, M.D.    PDP/MEDQ  D:  04/11/2004  T:  04/11/2004  Job:  [id number]

## 2010-11-04 NOTE — Op Note (Signed)
NAME:  Michele [name], Michele [name]                  ACCOUNT NO.:  [id number]   MEDICAL RECORD NO.:  [id number]          PATIENT TYPE:  AMB   LOCATION:  SDS                          FACILITY:  [facility]   PHYSICIAN:  Prabhakar D. Pendse, M.D.DATE OF BIRTH:  1986-06-05   DATE OF PROCEDURE:  09/18/2005  DATE OF DISCHARGE:                                 OPERATIVE REPORT   PREOPERATIVE DIAGNOSES:  1.  Cerebral palsy.  2.  Nonfunctioning gastrostomy tube.   POSTOPERATIVE DIAGNOSES:  1.  Cerebral palsy.  2.  Nonfunctioning gastrostomy tube.   OPERATION PERFORMED:  1.  Upper endoscopy.  2.  Removal of the PEG tube.  3.  Placement of new #22 gastrostomy replacement button.   SURGEON:  Prabhakar D. Levie Heritage, M.D.   ASSISTANT:  Nurse.   ANESTHESIA:  Nurse.   OPERATIVE INDICATIONS:  This 25 year old girl who recently underwent PEG was  noted to have leakage of feeding from around the gastrostomy tube. It  resulted in significant skin excoriation.  On account of the patient's  obesity, topical management with warm compresses, Neosporin dressing, as  well as __________ wafer dressing were unsuccessful.  Hence, it was decided  to remove the #20  PEG tube and replace with #22 gastrostomy placement  button.  In order to avoid any trauma due to pulling of the tube and  dilating the gastrostomy opening, it was decided to perform this procedure  via endoscopy.   The patient, under satisfactory general anesthesia, the patient in a supine  position, endoscope was passed through the oropharynx and advanced through  the esophagus and brought into the body of the stomach.  The dome of the PEG  tube was identified and gentle external traction was applied to the PEG tube  which was felt by observation and that the dome would not cause any tenting  of the gastrostomy opening.  Hence, with direct observation from inside, the  PEG was gently removed by traction no obvious damage was noted.  Now, a #22  gastrostomy  replacement tube was placed through the gastrostomy site and the  balloon was inflated with about 10 mL saline.  Endoscopic evaluation showed  satisfactory placement of the balloon, hence, externally and plastic disk  was adjusted at the appropriate  distance.  The area was cleansed and dressed.  Endoscope was now removed  after decompression of the stomach.  Throughout the procedure, the patient's  vital signs remained stable.  The patient withstood the procedure well and  was transferred to recovery room in satisfactory general condition.           ______________________________  Hyman Bible Levie Heritage, M.D.     PDP/MEDQ  D:  09/18/2005  T:  09/18/2005  Job:  [id number]

## 2010-11-04 NOTE — Discharge Summary (Signed)
NAME:  Michele [name], Michele [name]                  ACCOUNT NO.:  [id number]   MEDICAL RECORD NO.:  [id number]          PATIENT TYPE:  INP   LOCATION:  6119                         FACILITY:  [facility]   PHYSICIAN:  Bonnita Hollow, M.D.DATE OF BIRTH:  1986/01/21   DATE OF ADMISSION:  03/22/2004  DATE OF DISCHARGE:                                 DISCHARGE SUMMARY   REASON FOR HOSPITALIZATION:  For greater than one month, refusal to feed by  mouth, mental status changes below baseline.   HISTORY:  Michele [name] is a 25 year old female with past medical history of MRCP  and seizure disorder that was admitted on March 22, 2004 for refusal to  feed and decreased ambulation in addition to changes in mental status below  baseline.  The patient was initially felt to have an esophagitis due to  recent diagnosis of candidal infection.  The patient was admitted for  observation and treatment.   PROBLEM LIST:  1.  GI:  Etiology of patient refusal to feed was initially thought to be      related to a recent diagnosis of oral candidal infection.  We initially      felt that candidal infection may be the etiology of her refusal to feed.      We consulted Dr. Chestine Spore of pediatric gastroenterology who performed an      EGD which was within normal limits.  Biopsies revealed findings      consistent with minimal criteria for gastroesophageal reflux.  The      patient was started on GER prophylaxis with Protonix.  The patient was      eventually advanced to pureed diet but again refused to feed, not even      taking a liquid diet.  The patient did have a positive CLO-test which      was followed up with antibiotic treatment for H pylori.  MCV was also      found to be elevated to 108.  Therefore, it was also proposed that the      patient may be suffering from a gastritis.  However, the patient      remained afebrile and her p.o. intake did not improve with antibiotic      treatment and pain therapy.  The patient was  eventually advanced to NG      tube feed which she tolerated well.  Interdisciplinary meeting was      coordinated which included her teachers at [employer], nursing and      physician staff here, primary care physician, Dr. Carlynn Purl, and home health      and other social workers and it was decided at that meeting lead by her      mom's decision to place a PEG tube.  Surgery team was consulted which      placed PEG tube without complications.  The patient is tolerating G-tube      feedings well at present.   1.  Neurologic:  At admission, the patient's mental status was below      baseline.  It was also thought that  her refusal to feed may be related      to neurological changes.  Neurological consult was obtained.  Dr.      Sharene Skeans did not feel refusal to feed was related to neurologic changes      based upon MRI during admission which revealed no acute changes compared      to previous MRI.  The patient was maintained on seizure medications,      Topamax, Keppra and Depakote throughout admission.  The patient had only      one episode of seizure throughout admission which was controlled with      Ativan.  Dr. Sharene Skeans visited the patient before discharge and adjusted      medicines appropriately.  The patient will follow up with him in the      next four months and on a p.r.n. basis.   1.  Cardiovascular.  The patient developed hypertension during the hospital      admission. Her blood pressures reached the 130's and mid 140's and it      was opted to place the patient on blood pressure control and Metoprolol      25 mg daily was added which  did not control BPs at optimal systolic      blood pressure at 130's.  Therefore, Metoprolol was changed to 25 mg      b.i.d. with decent blood pressure control.  The patient does spike      increased blood pressures no more than twice a day in the 130's or      140's.  The patient will need to be followed up on the outpatient basis      to  monitor her blood pressures and adjust her Metoprolol accordingly.      The patient remained tachycardic throughout admission which is her      baseline in the 110's.   1.  Musculoskeletal.  The patient was placed on vitamin D and calcium      supplementation during the hospital admission for remote history of      osteoporosis.  She did receive a bone density during admission which      showed at least two standard deviations below baseline for her      chronological age.  It was opted to place the patient on a      bisphosphonate as an outpatient.  Therefore, Michele [name] will be started on      Boniva q. month at discharge.  The patient received PT/OT after her PEG      placement.  Was able to sit in chair 45 minutes to an hour.  She will      continue PT/OT at [facility] and by home health p.r.n. pending      intermittent reassessment.   1.  Hematology.  The patient was placed on Foltx one tablet daily for      increased MCV of 108 at discharge.   1.  Respiratory.  The patient remained stable throughout admission. SPOT      remained above 92% on room air without need of nasal cannula.  Chest      best for PT was continued after surgery for pneumonia prophylaxis.   DISCHARGE MEDICATIONS:  1.  Boniva 150 mg once monthly.  2.  Protonix 40 mg b.i.d.  3.  Foltx one tablet daily.  4.  Metoprolol 25 mg b.i.d.  5.  Topamax 150 mg b.i.d.  6.  Keppra 325 mg b.i.d.  7.  Depakote sprinkles 500 mg b.i.d.  8.  Synthroid 0.05 mg daily.  9.  __________  500 mg b.i.d.  10. Vitamin D 400 IU b.i.d.  11. Calcium 500 mg daily.  12. Lactulose 50 mg daily p.r.n.  13. Diastat 50 mg in rectum for seizure that last greater than five minutes.   Of note, all medicines are to be given per PEG tube.   DIET:  __________  1.2 mmol 70 ml/hour from 9 p.m. to 6 a.m. Bolus feeds at  8 a.m.  12 noon and 4 p.m. which will include 350 mL followed by 330 mL  flush and free water.  FOLLOW UP:  The patient will  follow up with [facility], Dr.  Carlynn Purl, on Friday November 4 at 11:30 a.m.  She will also follow up with Dr.  Sharene Skeans in four months or on a p.r.n. basis.   CONDITION ON DISCHARGE:  Stable.       VRE/MEDQ  D:  04/16/2004  T:  04/16/2004  Job:  [id number]   cc:   Maia Breslow, M.D.  [address]  [place]  [place] [address]  Fax: [phone]   Deanna Artis. Sharene Skeans, M.D.  [address]  [address]  [place]  [place] [address]  Fax: [phone]

## 2010-11-04 NOTE — Op Note (Signed)
[place]  Patient:    Michele [name], Michele [name]                         MRN: [id number] Proc. Date: 01/20/00 Adm. Date:  [id number] Disc. Date: [id number] Attending:  Sandi Raveling                           Operative Report  DATE OF BIRTH:  [birth year].  PREOPERATIVE DIAGNOSIS:  Cerebral palsy with feeding disabilities.  POSTOPERATIVE DIAGNOSIS:  Cerebral palsy with feeding disabilities.  PROCEDURE PERFORMED:  Change of G-button with electrocauterization.  ANESTHESIA:  Local.  SURGEON:  Shuaib M. Leeanne Mannan, M.D.  DESCRIPTION OF PROCEDURE:  The procedure is performed in the endoscopy unit. The patient is brought into the room and 45 minutes prior to the procedure, Emla cream was applied around the G-button site.  The _____ was noted, which was cauterized with hand-held electrocautery and subsequently silver nitrate stick was applied to cauterize the excess granulation.  The old G-button, which was size 14 [place] 2.3 cm, was removed by deflating the balloon, and a new MIC G-button size 18 French 2.3 cm, well-lubricated, was reinserted without any difficulty into the stomach.  The balloon was inflated with normal saline to 4 cc capacity.  The G-button fitted snugly very well, and it flushed very easily.  The procedure was completed without any complication.  The patient was stable and comfortable throughout the procedure. DD:  01/20/00 TD:  01/20/00 Job: [id number]

## 2010-11-04 NOTE — Telephone Encounter (Signed)
Spoke with patient's mother and informed her of Michele [name]'s GI appoint at [facility]. 11/18/2010 @ 9:30am with Dr. Jarold Motto. Gave patient address, information she needs to bring, and phone number in case she needs to cancel or reschedule.Michele [name]

## 2010-11-07 NOTE — Telephone Encounter (Signed)
Will route to Dr. Cristal Tobler who will call her to discuss.

## 2010-11-08 NOTE — Telephone Encounter (Signed)
Called her cell phone. Left message. Awaiting return call.

## 2010-11-09 NOTE — Telephone Encounter (Signed)
Called patient to discuss. She was unhappy with my clinic availability and having to take Michele [name] to the ER several months ago. Somehow she was satisfied with our conversation and is willing to continue with her current PCP.  In the future is ok to double book Michele [name] in my clinic.

## 2010-11-17 ENCOUNTER — Other Ambulatory Visit: Payer: Self-pay | Admitting: Family Medicine

## 2010-11-18 ENCOUNTER — Telehealth: Payer: Self-pay | Admitting: *Deleted

## 2010-11-18 ENCOUNTER — Ambulatory Visit: Payer: Medicaid Other | Admitting: Gastroenterology

## 2010-11-18 NOTE — Telephone Encounter (Signed)
Pt is a 25 year old Mentally Retarded pt with Cystic Fibrosis referred to [place] my the [facility].  Pt has had multiple Peg tubes and was being seen by Dr Vicente Serene. Dr Wyline Mood has left for [place] for a year and Dr Chestine Spore refuses to see the pt because she is an adult. Pt is being referred to [place] for Reflux.  Since pt may require Peg tube procedures, replacement, etc. and since Dr Jarold Motto no longer goes to hospitals, one of our MDs needed to take the pt ( we were Unassigned ON CALL for May , we must take the pt).  I have sent notes to Dr Leone Payor and Dr Arlyce Dice to consider seeing the pt.

## 2010-11-21 ENCOUNTER — Telehealth: Payer: Self-pay

## 2010-11-21 NOTE — Telephone Encounter (Signed)
Message copied by Annett Fabian on Mon Nov 21, 2010  9:58 AM ------      Message from: Melvia Heaps MD D      Created: Mon Nov 21, 2010  8:45 AM      Regarding: RE: will you see pt?       ok      ----- Message -----         From: Linna Hoff, RN         Sent: 11/18/2010   8:59 AM           To: Louis Meckel, MD      Subject: will you see pt?                                         Pt is a 25 year old MR pt with Cystic Fibrosis who say Dr Vicente Serene for her needs. Dr Wyline Mood is a surgeon and pt has had multiple Peg tubes. Dr Wyline Mood has left for a year for [place] and pt has no source of GI f/u. Dr Chestine Spore refuses to see her because she is an adult.       Pt with new onset of Reflux dx at an out pt clinic. Pt needs to be seen, but with her hx of Peg tubes and probably needing hospital visits, etc. Dr Jarold Motto can not follow- he was given pt as DOD.            Will you see the pt?      Thanks.      Graciella Freer, RN

## 2010-11-21 NOTE — Telephone Encounter (Signed)
I can go either way with regards to taking pt.  How is it determined which MD it goes to?

## 2010-11-21 NOTE — Telephone Encounter (Signed)
Per staff message Dr Arlyce Dice said he would see the patient .  I have scheduled with the patient's mother for 12/12/10.  See other phone note from 11/21/10

## 2010-11-21 NOTE — Telephone Encounter (Signed)
Patient is scheduled for 12/12/10 at 10:00 with Dr Arlyce Dice.

## 2010-11-24 ENCOUNTER — Emergency Department (HOSPITAL_COMMUNITY)
Admission: EM | Admit: 2010-11-24 | Discharge: 2010-11-24 | Disposition: A | Discharge status: Home / Self Care | Payer: Medicaid Other | Attending: Emergency Medicine | Admitting: Emergency Medicine

## 2010-11-24 DIAGNOSIS — G40909 Epilepsy, unspecified, not intractable, without status epilepticus: Secondary | ICD-10-CM | POA: Insufficient documentation

## 2010-11-24 DIAGNOSIS — I10 Essential (primary) hypertension: Secondary | ICD-10-CM | POA: Insufficient documentation

## 2010-11-24 DIAGNOSIS — G809 Cerebral palsy, unspecified: Secondary | ICD-10-CM | POA: Insufficient documentation

## 2010-11-24 DIAGNOSIS — Y833 Surgical operation with formation of external stoma as the cause of abnormal reaction of the patient, or of later complication, without mention of misadventure at the time of the procedure: Secondary | ICD-10-CM | POA: Insufficient documentation

## 2010-11-24 DIAGNOSIS — K9420 Gastrostomy complication, unspecified: Secondary | ICD-10-CM | POA: Insufficient documentation

## 2010-11-24 DIAGNOSIS — F79 Unspecified intellectual disabilities: Secondary | ICD-10-CM | POA: Insufficient documentation

## 2010-11-24 DIAGNOSIS — E039 Hypothyroidism, unspecified: Secondary | ICD-10-CM | POA: Insufficient documentation

## 2010-11-25 ENCOUNTER — Encounter: Payer: Self-pay | Admitting: Family Medicine

## 2010-11-25 ENCOUNTER — Ambulatory Visit (INDEPENDENT_AMBULATORY_CARE_PROVIDER_SITE_OTHER): Payer: Medicaid Other | Admitting: Family Medicine

## 2010-11-25 DIAGNOSIS — L0390 Cellulitis, unspecified: Secondary | ICD-10-CM | POA: Insufficient documentation

## 2010-11-25 DIAGNOSIS — L0291 Cutaneous abscess, unspecified: Secondary | ICD-10-CM

## 2010-11-25 DIAGNOSIS — K219 Gastro-esophageal reflux disease without esophagitis: Secondary | ICD-10-CM

## 2010-11-25 MED ORDER — FLUCONAZOLE 150 MG PO TABS: ORAL_TABLET | ORAL | Status: DC

## 2010-11-25 MED ORDER — ESOMEPRAZOLE MAGNESIUM 40 MG PO PACK: PACK | ORAL | Status: DC

## 2010-11-25 NOTE — Patient Instructions (Signed)
Have her complete the course of antibiotics I have sent in a prescription for Diflucan to help prevent a yeast infection Please schedule a follow up appointment in 1-2 weeks to check on the infection

## 2010-11-25 NOTE — Progress Notes (Signed)
  Subjective:    Patient ID: Michele [name], female    DOB: [birth year], 25 y.o.   MRN: [id number]  HPI F/U ED visit for infected G-tube  1. Infected G-tube:  Pt was brought to the ED yesterday because of an infected g-tube.  It was draining and there was redness around the g-tube.  She was not having fevers.  This has been a long standing problem and she is being followed by GI.  She was scheduled for an appointment with them on 06/01 but it was cancelled in case she needed to go to surgery to have the g-tube fixed.  Her appointment now is with Dr. Arlyce Dice on 06/25.  She was prescribed Keflex for the infection and Tramadol to help with the pain.  She did really well last night and slept through the night.  The g-tube site is a little less red but it is still draining.   Review of Systems  Constitutional: Negative for fever and chills.  Gastrointestinal: Negative for abdominal distention.       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  HENT:  Right Ear: External ear normal.  Left Ear: External ear normal.  Eyes: No scleral icterus.  Cardiovascular: Normal rate and regular rhythm.        No peripheral edema  Pulmonary/Chest: Effort normal and breath sounds normal.  Abdominal:       g-tube site with some surrounding erythema.  Minimal serosegoinous discharge.  Non-tender.  g-tube is in place and appears to be functioning properly  Skin: Skin is warm and dry.          Assessment & Plan:

## 2010-11-25 NOTE — Assessment & Plan Note (Signed)
Improved with Nexium.  Needed refill.

## 2010-11-25 NOTE — Assessment & Plan Note (Signed)
Minimal cellulitis surrounding g-tube.  It is functioning properly.  She does not have any fevers or signs of systemic infection.  Advised grandmother to make sure that she takes all of the antibiotics and to continue the Tramadol as needed for pain.  She also has frequent vaginal candidiasis with abx so provided Rx for Diflucan.

## 2010-11-28 ENCOUNTER — Telehealth: Payer: Self-pay | Admitting: Family Medicine

## 2010-11-28 ENCOUNTER — Ambulatory Visit: Payer: Medicaid Other | Admitting: Family Medicine

## 2010-11-28 MED ORDER — OMEPRAZOLE MAGNESIUM 10 MG PO PACK: 20.0000 mg | PACK | Freq: Every day | ORAL | Status: DC

## 2010-11-28 NOTE — Telephone Encounter (Signed)
Nexium on back order.  Substituted omeprozole.

## 2010-11-29 ENCOUNTER — Telehealth: Payer: Self-pay | Admitting: Family Medicine

## 2010-11-29 NOTE — Telephone Encounter (Signed)
PA form faxed to medicaid. Mother notified. Will call her when we hear back at [phone].

## 2010-11-29 NOTE — Telephone Encounter (Signed)
Needs PA for her Omeprozole

## 2010-11-29 NOTE — Telephone Encounter (Signed)
PA form given to Dr. Leveda Anna for completion

## 2010-11-30 ENCOUNTER — Telehealth: Payer: Self-pay | Admitting: Family Medicine

## 2010-11-30 ENCOUNTER — Emergency Department (HOSPITAL_COMMUNITY)
Admission: EM | Admit: 2010-11-30 | Discharge: 2010-12-01 | Disposition: A | Discharge status: Home / Self Care | Payer: Medicaid Other | Attending: Emergency Medicine | Admitting: Emergency Medicine

## 2010-11-30 ENCOUNTER — Telehealth: Payer: Self-pay | Admitting: *Deleted

## 2010-11-30 DIAGNOSIS — I10 Essential (primary) hypertension: Secondary | ICD-10-CM | POA: Insufficient documentation

## 2010-11-30 DIAGNOSIS — F79 Unspecified intellectual disabilities: Secondary | ICD-10-CM | POA: Insufficient documentation

## 2010-11-30 DIAGNOSIS — Z931 Gastrostomy status: Secondary | ICD-10-CM | POA: Insufficient documentation

## 2010-11-30 DIAGNOSIS — E039 Hypothyroidism, unspecified: Secondary | ICD-10-CM | POA: Insufficient documentation

## 2010-11-30 DIAGNOSIS — R109 Unspecified abdominal pain: Secondary | ICD-10-CM | POA: Insufficient documentation

## 2010-11-30 DIAGNOSIS — G809 Cerebral palsy, unspecified: Secondary | ICD-10-CM | POA: Insufficient documentation

## 2010-11-30 DIAGNOSIS — M810 Age-related osteoporosis without current pathological fracture: Secondary | ICD-10-CM | POA: Insufficient documentation

## 2010-11-30 MED ORDER — PANTOPRAZOLE SODIUM 40 MG PO PACK: 40.0000 mg | PACK | Freq: Every day | ORAL | Status: DC

## 2010-11-30 NOTE — Telephone Encounter (Signed)
Pharmacy notified that Protonix has been approved.

## 2010-11-30 NOTE — Telephone Encounter (Signed)
Ms. Tonkinson mother called to find out what the status of the authorization for the medication for acid reflux.

## 2010-11-30 NOTE — Telephone Encounter (Signed)
Mother notified

## 2010-11-30 NOTE — Telephone Encounter (Signed)
Omeprazole has been approved by medicaid. Pharmacy notified.

## 2010-11-30 NOTE — Telephone Encounter (Signed)
Apparently, omeprozole is also on back order.  Hopefully, the third time PPI will be a charm.

## 2010-11-30 NOTE — Telephone Encounter (Signed)
recieved call from Pharmacy and was told  Prisolec is also on back order.

## 2010-12-12 ENCOUNTER — Ambulatory Visit (INDEPENDENT_AMBULATORY_CARE_PROVIDER_SITE_OTHER): Payer: Medicaid Other | Admitting: Gastroenterology

## 2010-12-12 ENCOUNTER — Ambulatory Visit (HOSPITAL_COMMUNITY)
Admission: RE | Admit: 2010-12-12 | Discharge: 2010-12-12 | Disposition: A | Discharge status: Home / Self Care | Payer: Medicaid Other | Source: Ambulatory Visit | Attending: Gastroenterology | Admitting: Gastroenterology

## 2010-12-12 ENCOUNTER — Encounter: Payer: Self-pay | Admitting: Gastroenterology

## 2010-12-12 DIAGNOSIS — K9420 Gastrostomy complication, unspecified: Secondary | ICD-10-CM

## 2010-12-12 DIAGNOSIS — Z431 Encounter for attention to gastrostomy: Secondary | ICD-10-CM | POA: Insufficient documentation

## 2010-12-12 DIAGNOSIS — K9423 Gastrostomy malfunction: Secondary | ICD-10-CM

## 2010-12-12 DIAGNOSIS — K219 Gastro-esophageal reflux disease without esophagitis: Secondary | ICD-10-CM

## 2010-12-12 NOTE — Assessment & Plan Note (Signed)
There is leakage around the gastrostomy tube because it appears that the fistula is larger than the tube diameter.  Recommendations #1 I will exchange the tube for a larger diameter gastrostomy tube

## 2010-12-12 NOTE — Patient Instructions (Signed)
You will gp to [facility] today to the Endoscopy department today at 11:30am. You are having your PEG tube Exchanged today

## 2010-12-12 NOTE — Progress Notes (Signed)
History of Present Illness:  Ms. Shadowens is a 25 year old Afro-American female with mental retardation, cerebral palsy, chronic gastrostomy tube, referred at the request of Dr. Cristal Vanaman for evaluation of leakage around her gastrostomy tube. This is been a chronic problem. Several tubes have been replaced. With leakage she has some excoriation of the skin. The patient is unable to give a history, which is provided by her mother.    Review of Systems: She is incontinent of stool.   Pertinent positive and negative review of systems were noted in the above HPI section. All other review of systems were otherwise negative.    Current Medications, Allergies, Past Medical History, Past Surgical History, Family History and Social History were reviewed in [employer] [facility] electronic medical record  Vital signs were reviewed in today's medical record. Physical Exam: Patient was examined while sitting in a wheelchair General: Well developed , well nourished, no acute distress Head: Normocephalic and atraumatic Eyes:  sclerae anicteric, EOMI Ears: Normal auditory acuity Mouth: No deformity or lesions Lungs: Clear throughout to auscultation Heart: Regular rate and rhythm; no murmurs, rubs or bruits Abdomen: Soft, non tender and non distended. No masses, hepatosplenomegaly or hernias noted. Normal Bowel sounds; a gastrostomy tube is in place. There is leakage around the tube. There is small excoriation of the skin but no frank infection Rectal:deferred Musculoskeletal: Symmetrical with no gross deformities  Pulses:  Normal pulses noted Extremities: No clubbing, cyanosis, edema or deformities noted Neurological: Alert.  grossly nonfocal Psychological:  Alert and cooperative. Normal mood and affect

## 2010-12-13 NOTE — Assessment & Plan Note (Signed)
There is leakage around the gastrostomy tube because it appears that the fistula is larger than the tube diameter.  Recommendations #1 I will exchange the tube for a larger diameter gastrostomy tube 

## 2010-12-18 ENCOUNTER — Emergency Department (HOSPITAL_COMMUNITY): Payer: Medicaid Other

## 2010-12-18 ENCOUNTER — Emergency Department (HOSPITAL_COMMUNITY)
Admission: EM | Admit: 2010-12-18 | Discharge: 2010-12-19 | Disposition: A | Discharge status: Home / Self Care | Payer: Medicaid Other | Attending: Emergency Medicine | Admitting: Emergency Medicine

## 2010-12-18 DIAGNOSIS — R109 Unspecified abdominal pain: Secondary | ICD-10-CM | POA: Insufficient documentation

## 2010-12-18 DIAGNOSIS — G809 Cerebral palsy, unspecified: Secondary | ICD-10-CM | POA: Insufficient documentation

## 2010-12-18 DIAGNOSIS — R0602 Shortness of breath: Secondary | ICD-10-CM | POA: Insufficient documentation

## 2010-12-18 DIAGNOSIS — L089 Local infection of the skin and subcutaneous tissue, unspecified: Secondary | ICD-10-CM | POA: Insufficient documentation

## 2010-12-18 DIAGNOSIS — F79 Unspecified intellectual disabilities: Secondary | ICD-10-CM | POA: Insufficient documentation

## 2010-12-18 DIAGNOSIS — Z431 Encounter for attention to gastrostomy: Secondary | ICD-10-CM | POA: Insufficient documentation

## 2010-12-18 DIAGNOSIS — I10 Essential (primary) hypertension: Secondary | ICD-10-CM | POA: Insufficient documentation

## 2010-12-19 ENCOUNTER — Encounter: Payer: Self-pay | Admitting: Family Medicine

## 2010-12-19 ENCOUNTER — Emergency Department (HOSPITAL_COMMUNITY): Payer: Medicaid Other

## 2010-12-19 ENCOUNTER — Telehealth: Payer: Self-pay | Admitting: Gastroenterology

## 2010-12-19 ENCOUNTER — Ambulatory Visit (INDEPENDENT_AMBULATORY_CARE_PROVIDER_SITE_OTHER): Payer: Medicaid Other | Admitting: Family Medicine

## 2010-12-19 DIAGNOSIS — K9423 Gastrostomy malfunction: Secondary | ICD-10-CM

## 2010-12-19 NOTE — Progress Notes (Signed)
  Subjective:    Patient ID: Michele [name], female    DOB: 08-Mar-1986, 25 y.o.   MRN: [id number]  HPI Pt and mother here today.  Seen in ED yesterday due to pain from G tube.  G tube was changed out on 6/25.  Still complaining of pain. States was given tramadol in ED, which is not helping.    Mom wants G tube removed because daughter is taking PO and able to swallow her medications.  She states that daughter pulls it out frequently and would be better without it.  Review of Systems Denies fevers, N/V/D    Objective:   Physical Exam Vital signs reviewed General appearance - alert, well appearing, and in no distress .   G tube site- some redness and irritation around the G tube. No drainage seen.  Appears to be tender.     Assessment & Plan:

## 2010-12-19 NOTE — Telephone Encounter (Signed)
Pts mother states that the new tube is still leaking and she continues to have skin break down. Mother states that she is taking her medication and she is able to eat. Mother wants to know if the tube can just be removed. Dr. Arlyce Dice please advise.

## 2010-12-19 NOTE — Telephone Encounter (Signed)
Pt scheduled to see Dr. Arlyce Dice 12/20/10@10 :15am. Pts mother aware of appt date and time.

## 2010-12-19 NOTE — Telephone Encounter (Signed)
If her po intake is adequate we can pull the tube.  Have her schedule an OV for tomorrow.

## 2010-12-19 NOTE — Assessment & Plan Note (Signed)
Mother wants tube removed.  Dr. Arlyce Dice is new GI doctor.  Advised to go backa nd see him to discuss

## 2010-12-20 ENCOUNTER — Ambulatory Visit (INDEPENDENT_AMBULATORY_CARE_PROVIDER_SITE_OTHER): Payer: Medicaid Other | Admitting: Gastroenterology

## 2010-12-20 ENCOUNTER — Encounter: Payer: Self-pay | Admitting: Gastroenterology

## 2010-12-20 VITALS — BP 120/64 | HR 76

## 2010-12-20 DIAGNOSIS — K9423 Gastrostomy malfunction: Secondary | ICD-10-CM

## 2010-12-20 MED ORDER — METOCLOPRAMIDE HCL 10 MG PO TABS: ORAL_TABLET | ORAL | Status: DC

## 2010-12-20 NOTE — Progress Notes (Signed)
Ms. Facemire has returned because of persistent leakage around the gastrostomy tube. The tube was changed out last week for a larger caliber tube. Despite this he continues to have leakage around the gastrostomy tube site with mild excoriation of the skin. She was seen in the ER where injection through the tube demonstrated proper placement and contrast in the intestine.  On exam there is mild excoriation at the gastrostomy tube site. There is no frank infection.

## 2010-12-20 NOTE — Assessment & Plan Note (Addendum)
There is persistent leakage around the tube although no evidence for obstruction by x-ray.  The patient will start metoclopramide 10 mg 4 times a day. If leakage or backflow was not improved then I will proceed with upper endoscopy.

## 2010-12-20 NOTE — Patient Instructions (Signed)
We are sending you in a prescription to your pharmacy If no better call back in a week

## 2010-12-26 ENCOUNTER — Other Ambulatory Visit: Payer: Self-pay | Admitting: Family Medicine

## 2010-12-26 MED ORDER — LACTULOSE 10 GM/15ML PO SOLN: 10.0000 g | Freq: Two times a day (BID) | ORAL | Status: DC

## 2010-12-28 ENCOUNTER — Encounter: Payer: Medicaid Other | Admitting: Gastroenterology

## 2010-12-28 ENCOUNTER — Telehealth: Payer: Self-pay | Admitting: *Deleted

## 2010-12-28 ENCOUNTER — Other Ambulatory Visit: Payer: Self-pay | Admitting: *Deleted

## 2010-12-28 ENCOUNTER — Ambulatory Visit (HOSPITAL_COMMUNITY)
Admission: RE | Admit: 2010-12-28 | Discharge: 2010-12-28 | Disposition: A | Discharge status: Home / Self Care | Payer: Medicaid Other | Source: Ambulatory Visit | Attending: Gastroenterology | Admitting: Gastroenterology

## 2010-12-28 ENCOUNTER — Emergency Department (HOSPITAL_COMMUNITY)
Admission: EM | Admit: 2010-12-28 | Discharge: 2010-12-28 | Disposition: A | Discharge status: Home / Self Care | Payer: Medicaid Other | Attending: Emergency Medicine | Admitting: Emergency Medicine

## 2010-12-28 DIAGNOSIS — E550 Rickets, active: Secondary | ICD-10-CM | POA: Insufficient documentation

## 2010-12-28 DIAGNOSIS — F79 Unspecified intellectual disabilities: Secondary | ICD-10-CM | POA: Insufficient documentation

## 2010-12-28 DIAGNOSIS — M810 Age-related osteoporosis without current pathological fracture: Secondary | ICD-10-CM | POA: Insufficient documentation

## 2010-12-28 DIAGNOSIS — K929 Disease of digestive system, unspecified: Secondary | ICD-10-CM

## 2010-12-28 DIAGNOSIS — G40909 Epilepsy, unspecified, not intractable, without status epilepticus: Secondary | ICD-10-CM | POA: Insufficient documentation

## 2010-12-28 DIAGNOSIS — Z993 Dependence on wheelchair: Secondary | ICD-10-CM | POA: Insufficient documentation

## 2010-12-28 DIAGNOSIS — K9423 Gastrostomy malfunction: Secondary | ICD-10-CM

## 2010-12-28 DIAGNOSIS — K219 Gastro-esophageal reflux disease without esophagitis: Secondary | ICD-10-CM

## 2010-12-28 DIAGNOSIS — E039 Hypothyroidism, unspecified: Secondary | ICD-10-CM | POA: Insufficient documentation

## 2010-12-28 DIAGNOSIS — Z431 Encounter for attention to gastrostomy: Secondary | ICD-10-CM | POA: Insufficient documentation

## 2010-12-28 DIAGNOSIS — I10 Essential (primary) hypertension: Secondary | ICD-10-CM | POA: Insufficient documentation

## 2010-12-28 DIAGNOSIS — G809 Cerebral palsy, unspecified: Secondary | ICD-10-CM | POA: Insufficient documentation

## 2010-12-28 NOTE — Telephone Encounter (Signed)
Message copied by Daphine Deutscher on Wed Dec 28, 2010  3:59 PM ------      Message from: Florene Glen      Created: Wed Dec 28, 2010  3:44 PM                   ----- Message -----         From: Louis Meckel, MD         Sent: 12/28/2010   3:38 PM           To: Linna Hoff, RN            Please schedule pt for EGD

## 2010-12-28 NOTE — Telephone Encounter (Signed)
error 

## 2010-12-28 NOTE — Telephone Encounter (Signed)
Patient and her mother arrived a office reporting that patient pulled out her feeding tube. Per Dr. Arlyce Dice schedule patient at [facility] today to have it replaced. Spoke with Verlon Au and scheduled patient today with patient arrival at 2:00 PM and 3:00 PM procedure.

## 2010-12-28 NOTE — Telephone Encounter (Signed)
Scheduled patient with Verlon Au at [facility] endo for EGD with possible PEG replacement on 12/29/10 arrival at 10:15 AM for 12:15 PM procedure. Verlon Au has patient's mother with her and will tell her. Booking number [phone]

## 2010-12-29 ENCOUNTER — Ambulatory Visit (HOSPITAL_COMMUNITY)
Admission: RE | Admit: 2010-12-29 | Discharge: 2010-12-29 | Disposition: A | Discharge status: Home / Self Care | Payer: Medicaid Other | Source: Ambulatory Visit | Attending: Gastroenterology | Admitting: Gastroenterology

## 2010-12-29 ENCOUNTER — Encounter: Payer: Medicaid Other | Admitting: Gastroenterology

## 2010-12-29 DIAGNOSIS — K2970 Gastritis, unspecified, without bleeding: Secondary | ICD-10-CM | POA: Insufficient documentation

## 2010-12-29 DIAGNOSIS — K9423 Gastrostomy malfunction: Secondary | ICD-10-CM | POA: Insufficient documentation

## 2010-12-29 DIAGNOSIS — K2990 Gastroduodenitis, unspecified, without bleeding: Secondary | ICD-10-CM

## 2010-12-29 DIAGNOSIS — Y833 Surgical operation with formation of external stoma as the cause of abnormal reaction of the patient, or of later complication, without mention of misadventure at the time of the procedure: Secondary | ICD-10-CM | POA: Insufficient documentation

## 2010-12-30 ENCOUNTER — Encounter: Payer: Medicaid Other | Admitting: Gastroenterology

## 2010-12-30 ENCOUNTER — Ambulatory Visit (HOSPITAL_COMMUNITY)
Admission: RE | Admit: 2010-12-30 | Discharge: 2010-12-30 | Disposition: A | Discharge status: Home / Self Care | Payer: Medicaid Other | Source: Ambulatory Visit | Attending: Gastroenterology | Admitting: Gastroenterology

## 2010-12-30 ENCOUNTER — Encounter: Payer: Self-pay | Admitting: Gastroenterology

## 2010-12-30 DIAGNOSIS — G809 Cerebral palsy, unspecified: Secondary | ICD-10-CM | POA: Insufficient documentation

## 2010-12-30 DIAGNOSIS — K2970 Gastritis, unspecified, without bleeding: Secondary | ICD-10-CM | POA: Insufficient documentation

## 2010-12-30 DIAGNOSIS — K2990 Gastroduodenitis, unspecified, without bleeding: Secondary | ICD-10-CM | POA: Insufficient documentation

## 2010-12-30 DIAGNOSIS — K9420 Gastrostomy complication, unspecified: Secondary | ICD-10-CM

## 2010-12-30 DIAGNOSIS — K9429 Other complications of gastrostomy: Secondary | ICD-10-CM | POA: Insufficient documentation

## 2010-12-30 DIAGNOSIS — M810 Age-related osteoporosis without current pathological fracture: Secondary | ICD-10-CM | POA: Insufficient documentation

## 2010-12-30 DIAGNOSIS — G40909 Epilepsy, unspecified, not intractable, without status epilepticus: Secondary | ICD-10-CM | POA: Insufficient documentation

## 2010-12-30 DIAGNOSIS — I10 Essential (primary) hypertension: Secondary | ICD-10-CM | POA: Insufficient documentation

## 2010-12-30 DIAGNOSIS — Z79899 Other long term (current) drug therapy: Secondary | ICD-10-CM | POA: Insufficient documentation

## 2010-12-30 DIAGNOSIS — E039 Hypothyroidism, unspecified: Secondary | ICD-10-CM | POA: Insufficient documentation

## 2010-12-30 DIAGNOSIS — Y833 Surgical operation with formation of external stoma as the cause of abnormal reaction of the patient, or of later complication, without mention of misadventure at the time of the procedure: Secondary | ICD-10-CM | POA: Insufficient documentation

## 2010-12-30 NOTE — Progress Notes (Signed)
#  24Fr gastrostomy tube was placed through existing G tube tract.  Gastric contents were aspirated through the tube confirming proper tube placement

## 2010-12-30 NOTE — Progress Notes (Unsigned)
#  24Fr gastrostomy tube was inserted into existing g-tube site.  Gastric aspirate was obtained confirming proper position of the tube

## 2011-01-17 ENCOUNTER — Inpatient Hospital Stay (HOSPITAL_COMMUNITY)
Admission: EM | Admit: 2011-01-17 | Discharge: 2011-01-27 | DRG: 908 | Disposition: A | Discharge status: Home Health | Payer: Medicaid Other | Attending: Internal Medicine | Admitting: Internal Medicine

## 2011-01-17 ENCOUNTER — Emergency Department (HOSPITAL_COMMUNITY): Payer: Medicaid Other

## 2011-01-17 DIAGNOSIS — G809 Cerebral palsy, unspecified: Secondary | ICD-10-CM

## 2011-01-17 DIAGNOSIS — K316 Fistula of stomach and duodenum: Secondary | ICD-10-CM

## 2011-01-17 DIAGNOSIS — G40909 Epilepsy, unspecified, not intractable, without status epilepticus: Secondary | ICD-10-CM | POA: Diagnosis present

## 2011-01-17 DIAGNOSIS — K9422 Gastrostomy infection: Secondary | ICD-10-CM | POA: Diagnosis present

## 2011-01-17 DIAGNOSIS — K9423 Gastrostomy malfunction: Secondary | ICD-10-CM

## 2011-01-17 DIAGNOSIS — Y833 Surgical operation with formation of external stoma as the cause of abnormal reaction of the patient, or of later complication, without mention of misadventure at the time of the procedure: Secondary | ICD-10-CM | POA: Diagnosis present

## 2011-01-17 DIAGNOSIS — R1310 Dysphagia, unspecified: Secondary | ICD-10-CM | POA: Diagnosis present

## 2011-01-17 DIAGNOSIS — K9420 Gastrostomy complication, unspecified: Secondary | ICD-10-CM

## 2011-01-17 DIAGNOSIS — R1319 Other dysphagia: Secondary | ICD-10-CM

## 2011-01-17 DIAGNOSIS — T8183XA Persistent postprocedural fistula, initial encounter: Principal | ICD-10-CM | POA: Diagnosis present

## 2011-01-17 DIAGNOSIS — M810 Age-related osteoporosis without current pathological fracture: Secondary | ICD-10-CM | POA: Diagnosis present

## 2011-01-17 DIAGNOSIS — F79 Unspecified intellectual disabilities: Secondary | ICD-10-CM | POA: Diagnosis present

## 2011-01-17 LAB — COMPREHENSIVE METABOLIC PANEL
Albumin: 3.3 g/dL — ABNORMAL LOW (ref 3.5–5.2)
Alkaline Phosphatase: 64 U/L (ref 39–117)
BUN: 13 mg/dL (ref 6–23)
Chloride: 105 mEq/L (ref 96–112)
Creatinine, Ser: 0.65 mg/dL (ref 0.50–1.10)
GFR calc Af Amer: >60 mL/min (ref >60)
Glucose, Bld: 105 mg/dL — ABNORMAL HIGH (ref 70–99)
Total Bilirubin: 0.1 mg/dL — ABNORMAL LOW (ref 0.3–1.2)

## 2011-01-17 LAB — DIFFERENTIAL
Lymphs Abs: 5.2 10*3/uL — ABNORMAL HIGH (ref 0.7–4.0)
Monocytes Relative: 8 % (ref 3–12)
Neutro Abs: 4.4 10*3/uL (ref 1.7–7.7)
Neutrophils Relative %: 43 % (ref 43–77)

## 2011-01-17 LAB — CBC
HCT: 43.3 % (ref 36.0–46.0)
Hemoglobin: 13.9 g/dL (ref 12.0–15.0)
MCHC: 32.1 g/dL (ref 30.0–36.0)
MCV: 95.4 fL (ref 78.0–100.0)
RDW: 14.7 % (ref 11.5–15.5)
WBC: 10.4 10*3/uL (ref 4.0–10.5)

## 2011-01-17 MED ORDER — IOHEXOL 300 MG/ML  SOLN
50.0000 mL | Freq: Once | INTRAMUSCULAR | Status: AC | PRN
  Administered 2011-01-17: 50 mL via ORAL

## 2011-01-18 ENCOUNTER — Inpatient Hospital Stay (HOSPITAL_COMMUNITY): Payer: Medicaid Other

## 2011-01-19 DIAGNOSIS — K9423 Gastrostomy malfunction: Secondary | ICD-10-CM

## 2011-01-19 DIAGNOSIS — G809 Cerebral palsy, unspecified: Secondary | ICD-10-CM

## 2011-01-19 DIAGNOSIS — R1319 Other dysphagia: Secondary | ICD-10-CM

## 2011-01-21 LAB — WOUND CULTURE: Gram Stain: NONE SEEN

## 2011-01-22 DIAGNOSIS — R1319 Other dysphagia: Secondary | ICD-10-CM

## 2011-01-22 DIAGNOSIS — K9423 Gastrostomy malfunction: Secondary | ICD-10-CM

## 2011-01-22 DIAGNOSIS — G809 Cerebral palsy, unspecified: Secondary | ICD-10-CM

## 2011-01-23 ENCOUNTER — Telehealth: Payer: Self-pay | Admitting: Gastroenterology

## 2011-01-23 ENCOUNTER — Inpatient Hospital Stay (HOSPITAL_COMMUNITY): Payer: Medicaid Other

## 2011-01-23 DIAGNOSIS — G809 Cerebral palsy, unspecified: Secondary | ICD-10-CM

## 2011-01-23 DIAGNOSIS — K9423 Gastrostomy malfunction: Secondary | ICD-10-CM

## 2011-01-23 LAB — DIFFERENTIAL
Basophils Absolute: 0 10*3/uL (ref 0.0–0.1)
Eosinophils Relative: 0 % (ref 0–5)
Lymphocytes Relative: 22 % (ref 12–46)
Lymphs Abs: 2.4 10*3/uL (ref 0.7–4.0)
Monocytes Absolute: 0.9 10*3/uL (ref 0.1–1.0)
Monocytes Relative: 8 % (ref 3–12)
Neutro Abs: 7.7 10*3/uL (ref 1.7–7.7)

## 2011-01-23 LAB — CBC
HCT: 40 % (ref 36.0–46.0)
Hemoglobin: 12.3 g/dL (ref 12.0–15.0)
MCH: 29.8 pg (ref 26.0–34.0)
MCHC: 30.8 g/dL (ref 30.0–36.0)
MCV: 96.9 fL (ref 78.0–100.0)
RDW: 15.2 % (ref 11.5–15.5)

## 2011-01-23 NOTE — Telephone Encounter (Signed)
Pts mother states that a Careers adviser called her and told her he did not want to close the g-tube hole. Pts mother is not happy, she states acid is irritating her daughters skin and she is able to eat. Mother states that Mike Gip PA was in this morning and she thought they were on the same page as far as having the hole closed. Mother states she has asked her daughters nurse to page Mike Gip PA to come by and talk with them this afternoon. Informed the mother that I would send this note to Mike Gip PA for review.

## 2011-01-24 DIAGNOSIS — R1319 Other dysphagia: Secondary | ICD-10-CM

## 2011-01-24 NOTE — Telephone Encounter (Signed)
noted 

## 2011-01-28 NOTE — Op Note (Signed)
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  PREOPERATIVE DIAGNOSIS:  Gastrocutaneous fistula.  POSTOPERATIVE DIAGNOSIS:  Gastrocutaneous fistula.  SURGEON:  Lodema Pilot, MD  ASSISTANT:  Dr. Zachery Dakins  ANESTHESIA:  General endotracheal anesthesia.  ESTIMATED BLOOD LOSS:  25 mL.  FLUIDS:  1100 mL crystalloid.  SPECIMENS:  None.  COMPLICATIONS:  None.  DRAINS:  None.  FINDINGS:  Stomach, adherent to the abdominal wall with well-formed gastrostomy tract.  The tract was excised and the stomach closed with TA- 60 Green stapler and staple line was oversewn.  INDICATIONS FOR PROCEDURE:  [name] is a 25 year old female with cerebral palsy who has had two prior gastrostomy tubes.  She has been gaining weight and now using the tube.  She has been eating very well without using the tube.  The tube has been leaking and causing maceration of the skin and abdominal pain.  The tube was removed, but the tract has failed to close and at the time she eats, she has liquid and even solid food come out of the tract and skin has been difficult to care for.  We had been unable to pouch this and even though I recommended additional 4 weeks to allow the tract to close, the mother is adamant that this be closed surgically despite our recommendations.  OPERATIVE DETAILS:  [name] was seen and evaluated in the preoperative area with the mother present and risks and benefits of the procedure were again discussed in lay terms and the mother desired to again proceed with planned procedure.  Prophylactic antibiotics were given and she was taken to the  operating room, placed on table in supine position and general endotracheal anesthesia was obtained.  Her abdomen was prepped and draped in the standard surgical fashion and a sterile Foley catheter was placed into the tract without difficulty and the blood was insufflated.  Elliptical incision was made around the gastrostomy site and dissection carried down through subcutaneous tissue using Bovie electrocautery.  Fascia was entered and the stomach and the Foley balloon was easily palpable up against the abdominal wall.  This allowed [place] to dissect circumferentially to the fascia around the stomach. Stomach was visualized and care was taken to avoid injury to the stomach at the end of dissection after the fascia was excised and the tract was elevated.  The Foley cath balloon was let down and the catheter removed. Everything was removed from the stomach and the tract was excised with a TA 60 stapler with a Green load to try to incorporate some normal stomach in the closure, and not just transecting the epithelial tract. The staples appeared well-formed.  The staple line was oversewn with 3-0 silk Lembert sutures to imbricate the staple line and omental patch was sewn over top of this  to minimize contact with the abdominal wall and minimize the chance of leak.  There was another adhesion from the stomach to the abdominal wall, cephalad and this was taken down to allow for closure of the fascia.  I oversewed this area as well and this adhesion.  In case, this was the prior gastrostomy tube site.  Although this did not appear to be big enough for prior gastrostomy.  This was held with 3-0 silk stitches and then oversewn again with 3-0 silk Lembert sutures.  An omentum was placed over this as well.  Abdomen was irrigated with sterile saline solution and inspected for hemostasis, which was obtained followed by electrocautery and silk sutures were needed.  The fascia was then closed with 2-0 PDS  sutures in a running fashion and which provided good approximation of the fascia.  Care was taken to avoid injury to underlying bowel contents and with the fascia well approximated, the wound was irrigated with sterile saline solution, again inspected for hemostasis.  Given the maceration of the skin and the nature of the case, the wound was left open and packed with moist Kerlix gauze and she will need a daily wound care on the floor.  All sponge, needle and instrument counts were correct on the case.  The patient tolerated the procedure well without apparent complications.          ______________________________ Lodema Pilot, MD     BL/MEDQ  D:  01/25/2011  T:  01/26/2011  Job:  [id number]  Electronically Signed by Lodema Pilot DO on 01/28/2011 06:03:14 PM

## 2011-01-30 ENCOUNTER — Emergency Department (HOSPITAL_COMMUNITY)
Admission: EM | Admit: 2011-01-30 | Discharge: 2011-01-30 | Disposition: A | Discharge status: Home / Self Care | Payer: Medicaid Other | Attending: Emergency Medicine | Admitting: Emergency Medicine

## 2011-01-30 ENCOUNTER — Emergency Department (HOSPITAL_COMMUNITY): Payer: Medicaid Other

## 2011-01-30 ENCOUNTER — Emergency Department (HOSPITAL_COMMUNITY)
Admission: EM | Admit: 2011-01-30 | Discharge: 2011-01-30 | Disposition: A | Discharge status: Home / Self Care | Payer: Medicaid Other | Source: Home / Self Care | Attending: Emergency Medicine | Admitting: Emergency Medicine

## 2011-01-30 DIAGNOSIS — R509 Fever, unspecified: Secondary | ICD-10-CM | POA: Insufficient documentation

## 2011-01-30 DIAGNOSIS — E039 Hypothyroidism, unspecified: Secondary | ICD-10-CM | POA: Insufficient documentation

## 2011-01-30 DIAGNOSIS — G809 Cerebral palsy, unspecified: Secondary | ICD-10-CM | POA: Insufficient documentation

## 2011-01-30 DIAGNOSIS — Z9889 Other specified postprocedural states: Secondary | ICD-10-CM | POA: Insufficient documentation

## 2011-01-30 DIAGNOSIS — J069 Acute upper respiratory infection, unspecified: Secondary | ICD-10-CM | POA: Insufficient documentation

## 2011-01-30 DIAGNOSIS — F79 Unspecified intellectual disabilities: Secondary | ICD-10-CM | POA: Insufficient documentation

## 2011-01-30 DIAGNOSIS — R059 Cough, unspecified: Secondary | ICD-10-CM | POA: Insufficient documentation

## 2011-01-30 DIAGNOSIS — I10 Essential (primary) hypertension: Secondary | ICD-10-CM | POA: Insufficient documentation

## 2011-01-30 DIAGNOSIS — G40909 Epilepsy, unspecified, not intractable, without status epilepticus: Secondary | ICD-10-CM | POA: Insufficient documentation

## 2011-01-30 DIAGNOSIS — Y838 Other surgical procedures as the cause of abnormal reaction of the patient, or of later complication, without mention of misadventure at the time of the procedure: Secondary | ICD-10-CM | POA: Insufficient documentation

## 2011-01-30 DIAGNOSIS — IMO0002 Reserved for concepts with insufficient information to code with codable children: Secondary | ICD-10-CM | POA: Insufficient documentation

## 2011-01-30 DIAGNOSIS — R05 Cough: Secondary | ICD-10-CM | POA: Insufficient documentation

## 2011-01-30 LAB — URINALYSIS, ROUTINE W REFLEX MICROSCOPIC
Glucose, UA: NEGATIVE mg/dL
Hgb urine dipstick: NEGATIVE
Ketones, ur: NEGATIVE mg/dL
Leukocytes, UA: NEGATIVE
Protein, ur: NEGATIVE mg/dL
pH: 6.5 (ref 5.0–8.0)

## 2011-01-30 NOTE — Consult Note (Signed)
  NAME:  Michele [name], Michele [name] NO.:  [id number]  MEDICAL RECORD NO.:  [id number]  LOCATION:  1513                         FACILITY:  [facility]  PHYSICIAN:  Thana Farr, MD    DATE OF BIRTH:  April 18, 1986  DATE OF CONSULTATION:  01/23/2011 DATE OF DISCHARGE:                                CONSULTATION   REFERRING PHYSICIAN:  Iva Boop, MD, Clementeen Graham  HISTORY:  Michele [name] is a 25 year old female with history of cerebral palsy, MR, and seizure disorder who was admitted due to some issues with her PEG.  The patient has a baseline seizure disorder with baseline seizure frequency at approximately 2-3 per month.  Last seizure prior to this hospitalization was approximately 2 weeks ago.  It was noted on early yesterday to have a generalized seizure.  There have been some issues with the patient's dosing medications that she has been here in the hospital.  The patient's mother has brought in some medications from home and doses have been adjusted.  Consult called for any further recommendations concerning the patient's medications.  PAST MEDICAL HISTORY: 1. Cerebral palsy. 2. MR. 3. Seizure disorder. 4. Dysphagia with secondary PEG placement.  SOCIAL HISTORY:  The patient is the mother at home and goes to a day program during the day.  She has no history of alcohol, tobacco, or illicit drug abuse.  MEDICATIONS AT HOME:  Topamax, Keppra, Depakote, Protonix, Carnitor.  PHYSICAL EXAMINATION:  VITAL SIGNS:  Blood pressure 115/75, heart rate 111, respiratory rate 22, T-max 98.2. MENTAL STATUS:  The patient is alert and moaning.  She is clapping constantly. CRANIAL NERVE:  The patient makes eye contact and tracks.  She wished to move around the room.  No facial weakness is noted. MOTOR:  The patient moves all extremities symmetrically.  Deep tendon reflexes are 2+ in the bilateral upper extremities, trace at the knees, and absent at the ankles.  Plantars are  mute.  Laboratory testing shows valproic acid level of 50.2.  ASSESSMENT:  Michele [name] is a 25 year old female with a longstanding history of seizures in the clinical setting of MR and CP.  The patient does have occasional breakthrough seizures at baseline.  She is currently stable. She is currently now on her home regimen.  The mother is resistant to any changes in medications being made by anybody, but Dr. Sharene Skeans, who is her outpatient neurologist.  PLAN:  No further neurologic intervention recommended this time.          ______________________________ Thana Farr, MD     LR/MEDQ  D:  01/23/2011  T:  01/24/2011  Job:  [id number]  Electronically Signed by Thana Farr MD on 01/30/2011 01:19:55 AM

## 2011-01-31 ENCOUNTER — Ambulatory Visit: Payer: Medicaid Other

## 2011-01-31 ENCOUNTER — Telehealth: Payer: Self-pay | Admitting: Family Medicine

## 2011-01-31 NOTE — Telephone Encounter (Signed)
Was in hospital and was given abx, she now has a yeast inf and needs something called into [facility] [employer] [facility]

## 2011-01-31 NOTE — Telephone Encounter (Signed)
Patient was prescribed an antibiotic prior to her surgery last week.  Now has a year infection to her bottom and b/t her legs.  Mom was asking for something to treat it.  Explained that we would not prescribe since we were not the ones who prescribed the antibiotic.  Her choices were to call the surgeon to get a script of she could be seen cross cover.  She chose to come in tomorrow.  Gave her an 8;30 appt with the [facility] doctor.

## 2011-01-31 NOTE — Telephone Encounter (Signed)
Mom called back and made an appt for tomorrow.

## 2011-02-01 ENCOUNTER — Ambulatory Visit (INDEPENDENT_AMBULATORY_CARE_PROVIDER_SITE_OTHER): Payer: Medicaid Other | Admitting: Family Medicine

## 2011-02-01 ENCOUNTER — Ambulatory Visit: Payer: Medicaid Other | Admitting: Family Medicine

## 2011-02-01 ENCOUNTER — Encounter: Payer: Self-pay | Admitting: Family Medicine

## 2011-02-01 VITALS — BP 86/66 | HR 78

## 2011-02-01 DIAGNOSIS — R21 Rash and other nonspecific skin eruption: Secondary | ICD-10-CM

## 2011-02-01 DIAGNOSIS — B372 Candidiasis of skin and nail: Secondary | ICD-10-CM | POA: Insufficient documentation

## 2011-02-01 MED ORDER — FLUCONAZOLE 150 MG PO TABS: ORAL_TABLET | ORAL | Status: DC

## 2011-02-01 MED ORDER — PEDI-DRI 100000 UNIT/GM EX POWD: 100000.0000 [IU] | Freq: Three times a day (TID) | CUTANEOUS | Status: DC | PRN

## 2011-02-01 NOTE — Patient Instructions (Signed)
Take the pill once today and again in 3 days if not improved. Use the Nystatin powder three times a day for 7-10 days until rash has improved.

## 2011-02-01 NOTE — Progress Notes (Signed)
  Subjective:    Patient ID: Michele [name], female    DOB: 30-Nov-1985, 25 y.o.   MRN: [id number]  HPI 1.  25 yo F with profound MR with recent hospitalization for G-tube removal and concurrent Abx use, now with vaginal erythema and edema as well as erythema upper thighs and labia.  Recurrent yeast infections in past with any Abx use.  Some itching.  Mom does home daycare and changes diapers regularly, she is incontinent of bowel and bladder.  No fevers, chills.   Review of Systems See HPI above for review of systems.       Objective:   Physical Exam Gen:  Vital signs reviewed.  Pleasant 25 yo appears younger than stated age, sitting in wheelchair.  Playful, smiling. Gyn:  Did not obtain wet prep due to MR.  Examined upper thighs and inguinal creases with mom's help, did not examine genitalia.  Redness, some satellite lesions noted.        Assessment & Plan:

## 2011-02-01 NOTE — Assessment & Plan Note (Signed)
Treat with Diflucan and Nystatin.  To call if no improvement.

## 2011-02-06 NOTE — Discharge Summary (Signed)
Michele, [name] NO.:  [id number]  MEDICAL RECORD NO.:  [id number]  LOCATION:  1513                         FACILITY:  [facility]  PHYSICIAN:  Erick Blinks, MD         DATE OF BIRTH:  [birth year]  DATE OF ADMISSION:  01/17/2011 DATE OF DISCHARGE:  01/27/2011                              DISCHARGE SUMMARY   ADMITTING DIAGNOSES: 53. A 25 year old female with cerebral palsy and severe mental     retardation with chronic dysphagia, presenting with     infected/leaking PEG tube. 2. Seizure disorder.  DISCHARGE DIAGNOSES: 1. Stable and improved status post removal of leaking gastrostomy tube     and surgical closure of gastrocutaneous fistula on January 25, 2011. 2. Seizure disorder. 3. Dysphagia minimal, at present the patient passed swallowing     evaluation and is tolerating a mechanical soft diet without any     difficulty.  CONSULTATIONS:  Anselm Pancoast. Zachery Dakins, MD/Brian Biagio Quint, MD, Surgery.  PROCEDURE:  Closure of gastrocutaneous fistula on January 25, 2011.  BRIEF HISTORY:  Michele [name] is a 25 year old African American female, cared for by her mother, and known to Dr. Arlyce Dice recently, primary patient of the [facility], Dr. Bruna Potter, who has had PEG tubes off and on for many years.  Her current PEG tube became irritated and started leaking in June 2012.  In order to try to correct this, the PEG tube was changed on June 25th from a 20 to a 24-French button PEG headache, but continued to leak copious amounts, although she was tolerating her tube feedings.  She also was given a course of Reglan. Despite this, she continued to have leakage.  She underwent upper endoscopy on July 12th which was negative and showed the PEG to be in place.  The tube was then removed for 24 hours in hopes that the tract would close down enough to prevent leakage and was replaced again on July 13th.  This has been ineffective thus far.  She had also been given a 1-week course of  Keflex for potential infection.  At this time, she is brought to the emergency room by her mother with complaints of increased leakage around the PEG site with ongoing continuous drainage and maceration and irritation of her skin.  Her mom was concerned because she seems to be in pain at this point and calls out and pulls out the tubing.  She has been using Ultram at home which is somewhat helpful. Family is unaware of any fever or chills.  She was admitted for medical management, repeat swallowing evaluation, etc. to determine whether or not the PEG tube needed to be removed.  LABORATORY STUDIES:  On July 31st, WBC was 10.4, hemoglobin 13.9, hematocrit of 43.3.  Electrolytes within normal limits.  Glucose 105. Liver function studies normal.  Albumin 3.3.  Followup labs on August 6th showed WBC 11, hemoglobin 12.3, hematocrit of 40.  X-ray studies and Gastrografin injection through the PEG tube on July 31st shows a patent gastrostomy in the expected location.  KUB on August 6th, no acute findings.  HOSPITAL COURSE:  The patient was admitted to the GI  service. Initially, we continued to use the PEG tube as she requires multiple medications for her seizure disorder.  She was started on IV fluids and on IV Rocephin and speech path swallowing eval was obtained as the mother said that she actually seemed to be doing well with p.o.'s at home.  She had swallowing evaluation which showed some evidence of aspiration risk due to her mastication deficits and lack of chewing her food, however, she was approved for a mechanical soft diet.  We began feeding her.  She tolerated this very well.  Her medications were also converted to oral form and given with applesauce which she seemed to tolerate well also.  The PEG tube continued to drain just copious amounts of fluid and was removed on January 18, 2010.  Despite removal of the tube and serial dressing changes multiple times throughout the day, the  site continued to leak copious amounts.  She was seen by wound care nursing as well as Dr. Zachery Dakins for surgery.  It was Dr. Annette Stable opinion that the site would eventually closed and we opted to observe her for several more days, however, by August 8th, she was just continuing to leak most of her food through the PEG site and also had medications obviously coming through the open site.  Surgery was agreeable to surgical closure and this was done on August 8th per Dr. Biagio Quint and Dr. Zachery Dakins.  She had stapling closure of the stomach and this was then oversewn and she was left with an open wound.  The wound is currently being dressed twice daily with wet-to-dry abdominal dressings.  On August 10th, she is felt stable for discharge to home and tolerating p.o.'s well.  Her wound site is benign and she will follow up with Dr. Biagio Quint in his office in 2 weeks.  Home health care is being arranged with [facility] for dressing changes twice daily with the normal saline wet-to-dry dressings.  She will continue all medications same as prior to admission and diet mechanical soft.  She will follow up with Dr. Arlyce Dice on a p.r.n. basis.     Mike Gip, PA-C   ______________________________ Erick Blinks, MD    AE/MEDQ  D:  01/27/2011  T:  01/28/2011  Job:  [id number]  Electronically Signed by AMY ESTERWOOD PA-C on 01/30/2011 11:20:52 AM Electronically Signed by Erick Blinks MD on 02/06/2011 02:08:16 PM

## 2011-02-08 ENCOUNTER — Encounter (INDEPENDENT_AMBULATORY_CARE_PROVIDER_SITE_OTHER): Payer: Self-pay | Admitting: General Surgery

## 2011-02-10 ENCOUNTER — Encounter (INDEPENDENT_AMBULATORY_CARE_PROVIDER_SITE_OTHER): Payer: Self-pay | Admitting: General Surgery

## 2011-02-10 ENCOUNTER — Ambulatory Visit (INDEPENDENT_AMBULATORY_CARE_PROVIDER_SITE_OTHER): Payer: Medicaid Other | Admitting: General Surgery

## 2011-02-10 VITALS — BP 110/66 | HR 65 | Temp 97.7°F | Ht 59.0 in | Wt 154.4 lb

## 2011-02-10 DIAGNOSIS — Z5189 Encounter for other specified aftercare: Secondary | ICD-10-CM

## 2011-02-10 DIAGNOSIS — Z4889 Encounter for other specified surgical aftercare: Secondary | ICD-10-CM

## 2011-02-10 NOTE — Patient Instructions (Signed)
Continue twice daily dressing changes

## 2011-02-10 NOTE — Progress Notes (Signed)
Subjective:     Patient ID: Michele [name], female   DOB: 1985/09/19, 25 y.o.   MRN: [id number]  HPI In this patient presents today 2 weeks status post closure of gastrostomy tube. Her mother states that she is doing well. She is getting good nutrition and she is anxious supplementing with boost protein shakes. She is doing daily wet to dry dressing changes and is not complaining of any discomfort. She has not had any drainage from the wound.specimen along Review of Systems     Objective:   Physical Exam She is in no acute distress and nontoxic-appearing  Her abdomen is soft and in no apparent tenderness. Her wound is healing well with healthy granulation tissue. There is a small amount of serous exudate at the base of the wound but no evidence of drainage or infection. The wound is repacked at the bedside and tolerated well   Assessment:     Status post closure of gastrostomy tube doing well    Plan:     Overall she looks good. She will continue doing twice daily dressing changes and this wound should be healed within the next 2-3 weeks. She will followup at that time for wound check.

## 2011-02-15 ENCOUNTER — Emergency Department (HOSPITAL_COMMUNITY): Payer: Medicaid Other

## 2011-02-15 ENCOUNTER — Emergency Department (HOSPITAL_COMMUNITY)
Admission: EM | Admit: 2011-02-15 | Discharge: 2011-02-15 | Disposition: A | Discharge status: Home / Self Care | Payer: Medicaid Other | Attending: Emergency Medicine | Admitting: Emergency Medicine

## 2011-02-15 DIAGNOSIS — S02401A Maxillary fracture, unspecified, initial encounter for closed fracture: Secondary | ICD-10-CM | POA: Insufficient documentation

## 2011-02-15 DIAGNOSIS — G40909 Epilepsy, unspecified, not intractable, without status epilepticus: Secondary | ICD-10-CM | POA: Insufficient documentation

## 2011-02-15 DIAGNOSIS — E039 Hypothyroidism, unspecified: Secondary | ICD-10-CM | POA: Insufficient documentation

## 2011-02-15 DIAGNOSIS — F79 Unspecified intellectual disabilities: Secondary | ICD-10-CM | POA: Insufficient documentation

## 2011-02-15 DIAGNOSIS — IMO0002 Reserved for concepts with insufficient information to code with codable children: Secondary | ICD-10-CM | POA: Insufficient documentation

## 2011-02-15 DIAGNOSIS — G809 Cerebral palsy, unspecified: Secondary | ICD-10-CM | POA: Insufficient documentation

## 2011-02-15 DIAGNOSIS — S02400A Malar fracture unspecified, initial encounter for closed fracture: Secondary | ICD-10-CM | POA: Insufficient documentation

## 2011-02-15 DIAGNOSIS — I10 Essential (primary) hypertension: Secondary | ICD-10-CM | POA: Insufficient documentation

## 2011-02-15 DIAGNOSIS — M810 Age-related osteoporosis without current pathological fracture: Secondary | ICD-10-CM | POA: Insufficient documentation

## 2011-02-15 DIAGNOSIS — S025XXA Fracture of tooth (traumatic), initial encounter for closed fracture: Secondary | ICD-10-CM | POA: Insufficient documentation

## 2011-02-15 DIAGNOSIS — Z79899 Other long term (current) drug therapy: Secondary | ICD-10-CM | POA: Insufficient documentation

## 2011-02-15 DIAGNOSIS — W108XXA Fall (on) (from) other stairs and steps, initial encounter: Secondary | ICD-10-CM | POA: Insufficient documentation

## 2011-02-19 ENCOUNTER — Other Ambulatory Visit: Payer: Self-pay | Admitting: Family Medicine

## 2011-02-20 NOTE — Telephone Encounter (Signed)
Refill request

## 2011-02-21 NOTE — H&P (Signed)
NAMEMARGURIETE, WOOTAN NO.:  [id number]  MEDICAL RECORD NO.:  [id number]  LOCATION:  1513                         FACILITY:  [facility]  PHYSICIAN:  Iva Boop, MD,FACGDATE OF BIRTH:  15-Jun-1986  DATE OF ADMISSION:  01/17/2011 DATE OF DISCHARGE:                             HISTORY & PHYSICAL   PROBLEM:  Infected leaking PEG tube.  HISTORY:  Michele [name] is a 25 year old African American female known to Dr. Arlyce Dice recently, primary patient of Dr. Bruna Potter at [facility].  She has history of cerebral palsy and mental retardation and has required PEG tube feedings for the past couple of years due to dysphasia.  Over the past 6 weeks, her PEG has become irritated and then started leaking in June.  Dr. Arlyce Dice changed the PEG tube on December 12, 2010, to a [address] scientific button; however, she continued to leak, though she was tolerating her tube feedings.  She was started on Reglan and on December 29, 2010, she underwent an endoscopy to further investigate and evaluate the positioning of the PEG, this was an unremarkable exam.  Subsequently, the PEG tube was removed for 24 hours in hopes that the tract would close down some and was then re-placed again on 7/13.  She also had been given a course of Keflex during this time.  Today, she comes to the emergency room, brought in by her mother with complaints of increased leakage, and continued drainage and irritation of her skin.  The site has become increasingly uncomfortable and Michele [name]'s mother was concerned that she is in pain and has been calling out, despite the use of Ultram at home.  She has pulled at her PEG some as well because of the irritation.  She was seen and evaluated in the emergency room, found to be quite stable, however, the PEG site is infected, draining a yellowish green effluent.  Her skin is very macerated and decision was made to admit her for medical management. Also, she has history  of seizure disorder and has frequent seizures, and requires multiple seizure medications, which are currently being given via the PEG tube.  PAST HISTORY:  Pertinent for cerebral palsy with mental retardation and seizure disorder.  SOCIAL HISTORY:  The patient lives with her mother at home.  She is minimally ambulatory generally in a wheelchair, but is able to stand and pivot.  No tobacco, no EtOH.  FAMILY HISTORY:  Negative for GI disease.  ALLERGIES:  TEGRETOL.  MEDICATIONS: 1. She is on Depakote Sprinkles 125 mg 3 times daily. 2. Protonix 40 mg daily. 3. Carnitor 100 mg 3 times daily. 4. Topamax 25 mg twice daily. 5. Keppra 650 mg twice daily, all given in solution form via the PEG     and has been using Ultram as needed for pain.  LABORATORY DATA:  Laboratory studies are pending at this time.  X-RAYS:  Gastrografin injection through the PEG tube this morning shows a patent gastrostomy in the expected location.  REVIEW OF SYSTEMS:  The patient is unable to offer a review of systems due to her mental status.  PHYSICAL EXAMINATION:  GENERAL: A well-developed obese African American  female in a wheelchair.  She is babbling and clapping her hands. VITAL SIGNS:  Temperature 98.4, blood pressure 117/88, pulse 93, sats 96 on room air. HEENT: Nontraumatic, normocephalic.  EOMI, PERRLA.  Sclerae anicteric. NECK:  Supple.  There is no JVD. CARDIOVASCULAR:  Regular rate and rhythm with S1 and S2.  No murmur, rub, or gallop. PULMONARY:  Clear to A and P. ABDOMEN:  Obese, soft.  She is tender around the PEG site and exquisitely tender with manipulation of the PEG tube.  There is no palpable fluctuance or induration.  The PEG site is very macerated and erythematous.  The stoma is large and she is freely leaking both tube feedings and what appears to be a purulence around a PEG site. EXTREMITIES: No clubbing, cyanosis, or edema. RECTAL:  Exam was not done on admission. SKIN:  Otherwise benign except for PEG site infection as above. NEURO:  The patient is alert.  She moves all 4 extremities, though has some weakness in the lower extremities.  She is, for the most, part nonverbal, though occasionally does speak.  IMPRESSION: 81. A 25 year old female with cerebral palsy, mental retardation with     chronic dysphasia with infected leaking of PEG site. 2. Seizure disorder.  PLAN:  The patient is admitted for IV antibiotics.  We will culture the wound site, obtain surgical consultation to help with wound management and also for consideration of a surgical gastrostomy.  We will obtain a speech path consultation for swallowing evaluation and determine her ability to take p.o.'s, which apparently she does at home intermittently.  If she is able to pass the swallowing eval, we may be able to pull the PEG and allow her current site to close.  If she fails the swallowing evaluation, we will still need to pull the PEG, but place a panda tube in the short-term until she can have a new site for her PEG or surgical gastrostomy.  For details, please see the orders.     Amy Esterwood, PA-C   ______________________________ Iva Boop, MD,FACG    AE/MEDQ  D:  01/18/2011  T:  01/18/2011  Job:  [id number]  cc:   Clyda Greener, MD Fax: [phone]  Electronically Signed by AMY ESTERWOOD PA-C on 01/30/2011 11:20:47 AM Electronically Signed by Stan Head MDFACG on 02/21/2011 05:31:11 PM

## 2011-03-01 ENCOUNTER — Encounter (INDEPENDENT_AMBULATORY_CARE_PROVIDER_SITE_OTHER): Payer: Self-pay | Admitting: General Surgery

## 2011-03-02 NOTE — Consult Note (Addendum)
Michele [name], Michele [name] NO.:  [id number]  MEDICAL RECORD NO.:  [id number]  LOCATION:  1513                         FACILITY:  [facility]  PHYSICIAN:  Anselm Pancoast. [name], M.D.DATE OF BIRTH:  [birth year]  DATE OF CONSULTATION:  01/17/2011 DATE OF DISCHARGE:                                CONSULTATION   REFERRING PHYSICIAN:  Dr. Arlyce Dice.  PRIMARY CARE PHYSICIAN: 1. Asher Muir, MD 2. Clyda Greener, MD  REASON FOR CONSULTATION:  Inspection of PEG site.  BRIEF HISTORY:  The patient is a 25 year old African American female with a history of cerebral palsy and mental retardation who has had numerous feeding tubes dating back to 2001.  She recently pulled out her feeding tube and it was replaced on December 12, 2010.  She was having trouble with leakage and underwent EGD on December 29, 2010, which was a normal exam.  She presents to the emergency room again today with some maceration around the feeding tube site and signs of infection.  She is being admitted by Dr. Arlyce Dice for evaluation and we were asked to see in consultation.  PAST MEDICAL HISTORY: 1. Cerebral palsy and mental retardation. 2. History of seizure disorder. 3. History of hypothyroidism.  She is off medicines currently. 4. History of osteoporosis. 5. Rickets disease.  PAST SURGICAL HISTORY:  The patient had multiple feeding tubes going back to 2001.  FAMILY HISTORY:  Mother is living and cares for the daughter, has no current health issues.  Father unknown.  REVIEW OF SYSTEMS:  Could not be obtained.  CURRENT MEDICATIONS:  Topamax; Keppra; Carnitor; Nexium; lactulose; Depakene; Osmolite 1.2, she has on average 7 cans per day, if she does eat something, the family cuts it down to 5 cans per day.  She cannot eat a mechanical soft diet.  CURRENT ALLERGIES:  TEGRETOL.  PHYSICAL EXAMINATION:  GENERAL:  This is an overweight African American female with obvious mental retardation and cerebral palsy.   She is sitting in wheelchair and is in no acute distress. VITAL SIGNS:  Temperature is 98.4, heart rate is 93, blood pressure 117/88, respiratory rate is 22, saturations 96% on room air. HEENT:  Head:  Normocephalic.  Eyes, ears, nose and throat are all within normal limits. NECK:  Trachea is midline.  Thyroid is nonpalpable.  There is no JVD. No bruits. CHEST:  Nontender. CARDIAC:  Normal S1 and S2.  No murmurs heard. ABDOMEN:  Soft, nontender.  Positive bowel sounds.  She had open gastrostomy site with local wound, skin maceration.  The tube slides easily up and down almost the length of the entire gastrostomy tube without difficulty.  The balloon appears to be intact and working well when properly approximated GU/RECTAL:  Deferred. MUSCULOSKELETAL:  No gross abnormalities. NEUROLOGIC:  No focal changes. PSYCHIATRIC:  She is nonverbal with obvious limitations.  LABORATORY DATA:  None currently.  DIAGNOSTICS:  A 24-French balloon is seen after injection of the gastrostomy.  This shows normal placement.  The balloon was patent and the stomach was not dilated.  IMPRESSION: 1. Leaking gastrostomy site with local skin maceration. 2. Cerebral palsy and mental retardation. 3. History of seizure disorder. 4. History  of hypothyroid, currently off medicine. 5. History of osteoporosis and rickets.  PLAN:  The patient was seen by Dr. Zachery Dakins.  He notes there was leakage around the G-tube.  The area is larger than the tube and we will continue to leave this as it is not in a proper position.  He recommended to place a stoma dressing around the G-tube site with instructions to keep the macerated area clean and dry.  Try and keep the tube balloon approximated up against the stomach wall.  The biggest question was whether she needs the G-tube for nutritional support.  We agree with local wound care and support and evaluation whether the tube can be removed and the site can be allowed to  heal and new in place if necessary.     Eber Hong, P.A.   ______________________________ Anselm Pancoast. Zachery Dakins, M.D.    WDJ/MEDQ  D:  01/17/2011  T:  01/18/2011  Job:  [id number]  cc:   Dr. Chuck Hint, MD  Clyda Greener, MD Fax: [phone]  Electronically Signed by Sherrie George P.A. on 02/09/2011 07:33:46 PM Electronically Signed by Consuello Bossier M.D. on 03/02/2011 08:11:10 AM Electronically Signed by Consuello Bossier M.D. on 03/02/2011 08:11:02 AM

## 2011-03-03 ENCOUNTER — Ambulatory Visit (INDEPENDENT_AMBULATORY_CARE_PROVIDER_SITE_OTHER): Payer: Medicaid Other | Admitting: General Surgery

## 2011-03-03 ENCOUNTER — Encounter (INDEPENDENT_AMBULATORY_CARE_PROVIDER_SITE_OTHER): Payer: Self-pay | Admitting: General Surgery

## 2011-03-03 VITALS — BP 100/58 | HR 70

## 2011-03-03 DIAGNOSIS — Z5189 Encounter for other specified aftercare: Secondary | ICD-10-CM

## 2011-03-03 DIAGNOSIS — Z4889 Encounter for other specified surgical aftercare: Secondary | ICD-10-CM

## 2011-03-03 NOTE — Progress Notes (Signed)
Subjective:     Patient ID: Michele [name], female   DOB: [birth year], 25 y.o.   MRN: [id number]  HPI This patient follow the prone jack status post closure of gastrocutaneous fistula. Her mother has been doing wet to dry dressing changes and the wound is now healed and the patient has no complaints per the mother. Her mother states that she has been taking good oral intake and has not needed a feeding tube. She has been doing boost supplements as well for added nutrition.  Review of Systems     Objective:   Physical Exam No acute distress and nontoxic-appearing  Abdomen is soft and nontender and exam the incision is well healed there is only a very small sliver of granulation tissue without sign of infection. No evidence of hernia.    Assessment:     Status post closure of gastrocutaneous fistula-doing well    Plan:     The wound is essentially closed and should not require any further wound care. She is maintaining good caloric intake and can return to activity as tolerated. She'll follow up with me on a p.r.n. basis.

## 2011-03-08 ENCOUNTER — Ambulatory Visit: Payer: Medicaid Other | Admitting: Gastroenterology

## 2011-03-10 ENCOUNTER — Ambulatory Visit (INDEPENDENT_AMBULATORY_CARE_PROVIDER_SITE_OTHER): Payer: Medicaid Other | Admitting: Family Medicine

## 2011-03-10 ENCOUNTER — Encounter: Payer: Self-pay | Admitting: Family Medicine

## 2011-03-10 VITALS — BP 104/68 | HR 72 | Temp 97.5°F

## 2011-03-10 DIAGNOSIS — M7989 Other specified soft tissue disorders: Secondary | ICD-10-CM

## 2011-03-10 LAB — BASIC METABOLIC PANEL
CO2: 23
Calcium: 9.3
GFR calc Af Amer: >60
GFR calc non Af Amer: >60
Glucose, Bld: 104 — ABNORMAL HIGH
Potassium: 3.9
Sodium: 141

## 2011-03-10 LAB — BASIC METABOLIC PANEL WITH GFR
CO2: 25 mEq/L (ref 19–32)
Calcium: 9.5 mg/dL (ref 8.4–10.5)
Chloride: 109 mEq/L (ref 96–112)
Creat: 0.62 mg/dL (ref 0.50–1.10)
Glucose, Bld: 79 mg/dL (ref 70–99)

## 2011-03-10 LAB — D-DIMER, QUANTITATIVE: D-Dimer, Quant: <0.1 ug/mL-FEU (ref 0.00–0.48)

## 2011-03-10 LAB — BRAIN NATRIURETIC PEPTIDE: Brain Natriuretic Peptide: 56.3 pg/mL (ref 0.0–100.0)

## 2011-03-10 NOTE — Progress Notes (Signed)
Michele [name] is a 25 year old female with profound mental retardation who presents to clinic today with bilateral leg swelling for one week. In the past 1-1/2 months she had a revision of her G-tube and a fall downstairs. Her belly surgery was August 10 and her fall was 3 weeks ago.  Mom noted the bilateral lower extremity swelling one week ago. She does not think that Michele [name] has had trouble breathing or changes in her urinary output. No fevers or chills.  Otherwise Michele [name] is acting normally.  PMH reviewed.  ROS as above otherwise neg Medications reviewed.  Exam:  BP 104/68  Pulse 72  Temp(Src) 97.5 F (36.4 C) (Oral)  LMP 02/07/2011 Gen: Well NAD HEENT: EOMI,  MMM Lungs: CTABL Nl WOB Heart: RRR no MRG Abd: NABS, NT, ND Exts: Bilateral lower extremities with very mild edema nonpitting. Left leg measures 38 cm and right 37 cm. Nontender to touch no cords palpable.

## 2011-03-10 NOTE — Patient Instructions (Signed)
Thank you for coming in today. I will call you with the results of the test. If we need to do followup tests we'll talk about it. I think he should see her doctor in a week or so

## 2011-03-10 NOTE — Assessment & Plan Note (Signed)
I'm not sure if this is true edema. However Michele [name] is not a good historian. I plan to assess for DVT with d-dimer, and for heart failure with BNP.  Will also check a BMP I feel both are likely to be normal.  Karmel will followup with her PCP in one week, unless I contact her otherwise.

## 2011-03-24 LAB — CARBAMAZEPINE LEVEL, TOTAL: Carbamazepine Lvl: <2 ug/mL — ABNORMAL LOW (ref 4.0–12.0)

## 2011-03-24 LAB — POCT I-STAT, CHEM 8
Calcium, Ion: 1.22 mmol/L (ref 1.12–1.32)
Creatinine, Ser: 1.1 mg/dL (ref 0.4–1.2)
Glucose, Bld: 82 mg/dL (ref 70–99)
Hemoglobin: 15.3 g/dL — ABNORMAL HIGH (ref 12.0–15.0)
Potassium: 3.8 mEq/L (ref 3.5–5.1)

## 2011-03-24 LAB — URINALYSIS, ROUTINE W REFLEX MICROSCOPIC
Bilirubin Urine: NEGATIVE
Hgb urine dipstick: NEGATIVE
Specific Gravity, Urine: 1.03 (ref 1.005–1.030)
Urobilinogen, UA: 1 mg/dL (ref 0.0–1.0)

## 2011-03-31 LAB — I-STAT 8, (EC8 V) (CONVERTED LAB)
Acid-base deficit: 8 — ABNORMAL HIGH
Bicarbonate: 16 — ABNORMAL LOW
Chloride: 112
HCT: 46
Operator id: 288831
pCO2, Ven: 28.9 — ABNORMAL LOW

## 2011-03-31 LAB — URINALYSIS, ROUTINE W REFLEX MICROSCOPIC
Glucose, UA: NEGATIVE
Ketones, ur: 15 — AB
Leukocytes, UA: NEGATIVE
Nitrite: NEGATIVE
Protein, ur: 30 — AB

## 2011-03-31 LAB — CULTURE, BLOOD (ROUTINE X 2)

## 2011-03-31 LAB — CBC
HCT: 40.4
MCV: 86.4
Platelets: 231
RDW: 17.1 — ABNORMAL HIGH

## 2011-03-31 LAB — URINE CULTURE
Colony Count: NO GROWTH
Culture: NO GROWTH

## 2011-03-31 LAB — URINE MICROSCOPIC-ADD ON

## 2011-03-31 LAB — DIFFERENTIAL
Basophils Absolute: 0
Eosinophils Absolute: 0
Eosinophils Relative: 0

## 2011-04-11 ENCOUNTER — Telehealth: Payer: Self-pay | Admitting: Family Medicine

## 2011-04-11 NOTE — Telephone Encounter (Signed)
Needs orders for patient to have Ensure Immune Health and how many daily.  pls fax attn: Harriett Sine

## 2011-04-11 NOTE — Telephone Encounter (Signed)
Fwd to pcp

## 2011-04-13 NOTE — Telephone Encounter (Signed)
I have placed an rx in the fax outbox to send to [place]. Thanks

## 2011-04-14 ENCOUNTER — Ambulatory Visit (INDEPENDENT_AMBULATORY_CARE_PROVIDER_SITE_OTHER): Payer: Medicaid Other | Admitting: Family Medicine

## 2011-04-14 ENCOUNTER — Encounter: Payer: Self-pay | Admitting: Family Medicine

## 2011-04-14 VITALS — BP 95/46 | HR 79 | Temp 97.7°F

## 2011-04-14 DIAGNOSIS — Z23 Encounter for immunization: Secondary | ICD-10-CM

## 2011-04-14 DIAGNOSIS — M7989 Other specified soft tissue disorders: Secondary | ICD-10-CM

## 2011-04-14 NOTE — Assessment & Plan Note (Signed)
Discussed with patient's mother at length about the care of this leg swelling. Patient has no red flags and is nonpitting edema. Patient does not ambulate much at all and is sitting 99% of the time. I do feel that this is due to gravity. Patient had labs last month no concern for DVT no concern for heart failure. At this time we will make no changes in patient's care. Patient's mother was educated on the tendon leg swelling. At home they're going to try to elevate her legs more often. He continued to wear the compression hose Mother given red flags to look out for and when to seek medical attention again Will followup with private care provider as needed.

## 2011-04-14 NOTE — Progress Notes (Signed)
  Subjective:    Patient ID: Michele [name], female    DOB: 1985/12/28, 25 y.o.   MRN: [id number]  HPI Patient is here for bilateral lower edema. Patient was seen by Dr. Denyse Amass for the same problem previously. At that time that Dr. Denyse Amass did give a d-dimer BNP as well as a basic metabolic panel that showed no abnormalities. Patient is accompanied by a caregiver who is her mother. Patient's mother states that she has been acting like herself. States that the swelling is the same as the previous visit. Patient has been wearing compression hose daily. Patient is also been putting her feet up at night in her hospital bed. Patient does not seem to be in pain and is acting like herself.   Review of Systems Denies fever, chills, chest pain, abdominal pain out of the ordinary or any shortness of breath.   past medical surgical history reviewed with no changes Objective:   Physical Exam Vitals reviewed Gen: Well NAD HEENT: EOMI,  MMM Lungs: CTABL Nl WOB Heart: RRR no MRG Abd: NABS, NT, ND Exts: Bilateral lower extremities with very mild edema nonpitting. Left leg measures 38 cm and right 37.5 cm. Nontender to touch no cords palpable.    Assessment & Plan:

## 2011-04-14 NOTE — Patient Instructions (Signed)
Very nice to meet you both. I am giving you a flu shot today. I want you to continue the compression hose in consider putting her feet up whenever she is sitting around home. Come back to see your primary care provider after the holidays.

## 2011-05-01 ENCOUNTER — Other Ambulatory Visit: Payer: Self-pay | Admitting: Family Medicine

## 2011-05-01 ENCOUNTER — Telehealth: Payer: Self-pay | Admitting: Family Medicine

## 2011-05-01 DIAGNOSIS — G809 Cerebral palsy, unspecified: Secondary | ICD-10-CM

## 2011-05-01 NOTE — Telephone Encounter (Signed)
Information faxed to [facility].Michele [name] [place]

## 2011-05-01 NOTE — Telephone Encounter (Signed)
Mother came in because [place] needs a RX for her lift.

## 2011-05-19 ENCOUNTER — Encounter: Payer: Self-pay | Admitting: Family Medicine

## 2011-05-19 ENCOUNTER — Ambulatory Visit (INDEPENDENT_AMBULATORY_CARE_PROVIDER_SITE_OTHER): Payer: Medicaid Other | Admitting: Family Medicine

## 2011-05-19 VITALS — BP 116/67 | HR 76 | Temp 97.6°F | Ht 59.0 in | Wt 157.0 lb

## 2011-05-19 DIAGNOSIS — J069 Acute upper respiratory infection, unspecified: Secondary | ICD-10-CM

## 2011-05-19 NOTE — Assessment & Plan Note (Signed)
Michele [name] has a cold. Plan to treat symptomatically with Tylenol as needed. Discuss red flag signs or symptoms with her caregiver. Discussed that antibiotics would not be helpful in this case. Will return to clinic as needed

## 2011-05-19 NOTE — Patient Instructions (Signed)
Thank you for coming in today. Get some adult acetomorphine (typlenol) and crush 1-2 in apple sauce every 6 hours.  If she has fevers or chills or trouble breathing let us know.  Otherwise she should well and get over this cold.

## 2011-05-19 NOTE — Progress Notes (Signed)
Michele [name] is a 25 year old female with significant mental retardation.  She has had a cold for one week. Her caregiver describes her symptoms as bilateral nasal discharge occasional nonproductive cough and no fever or dyspnea. She notes that Michele [name] has less food intake than usual but is drinking and making urine normally.  PMH reviewed.  ROS as above otherwise neg Medications reviewed. Current Outpatient Prescriptions  Medication Sig Dispense Refill  . alendronate (FOSAMAX) 70 MG tablet TAKE 1 TABLET BY G-TUBE EVERY 7 DAYS FOR BONES  4 tablet  2  . Calcium Carbonate-Vitamin D (CALCARB 600/D) 600-400 MG-UNIT per tablet 1 tablet by Per Tube route 2 (two) times daily. 1 tab per tube 2 time a day       . cephALEXin (KEFLEX) 500 MG capsule Take 500 mg by mouth 4 (four) times daily.        . Cetirizine HCl (ZYRTEC CHILDRENS HIVES RELIEF) 5 MG/5ML SYRP Take by mouth at bedtime. 2 teaspoons per tube for allergies       . clotrimazole-betamethasone (LOTRISONE) cream Apply topically as needed. Apply to rash on neck as needed       . divalproex (DEPAKOTE SPRINKLES) 125 MG capsule 125 mg by Per Tube route 2 (two) times daily. 6 tabs in the am and 8 tabs in pm for seizures per Dr. Sharene Skeans       . divalproex (DEPAKOTE) 125 MG EC tablet 125 mg by PEG Tube route 3 (three) times daily.        . fluconazole (DIFLUCAN) 150 MG tablet 1 tab by mouth today.  May repeat x 1 in 3 days.  1 tablet  1  . folic acid (FOLVITE) 1 MG tablet 1 mg by PEG Tube route daily. 1 tab per tube daily       . lactulose (CHRONULAC) 10 GM/15ML solution Place 15 mLs (10 g total) into feeding tube 2 (two) times daily. For constipation  240 mL  6  . levETIRAcetam (KEPPRA) 100 MG/ML solution 10 mg/kg by Per Tube route 2 (two) times daily. For seizures per Dr. Sharene Skeans       . levOCARNitine (CARNITOR) 1 GM/10ML solution 100 mg by Per Tube route 3 (three) times daily. 2 teaspoons per tube 3 x a day       . metoCLOPramide (REGLAN) 10 MG tablet Take  every 6 hours as directed  90 tablet  1  . miconazole (MICOTIN) 2 % powder Apply topically as needed.        . Nystatin (PEDI-DRI) 100000 UNIT/GM POWD Apply 1 g (100,000 Units total) topically 3 (three) times daily as needed. Apply to affected area as needed for rash around G tube  15 g  11  . Osmolite 1.2 Cal (OSMOLITE 1.2 CAL) LIQD Take 1 Can by mouth. 7 cans daily for nutrition       . Ostomy Supplies (STOMAHESIVE PASTE) PSTE by Does not apply route. Apply to skin around G tube 2 x a day; Disp one large tube       . pantoprazole sodium (PROTONIX) 40 mg/20 mL PACK Place 20 mLs (40 mg total) into feeding tube daily at 12 noon.  30 each  12  . topiramate (TOPAMAX SPRINKLE) 25 MG capsule Take 25 mg by mouth 2 (two) times daily. 6 tablets by mouth 2 x a day for seizures per Dr. Sharene Skeans         Exam:  BP 116/67  Pulse 76  Temp(Src) 97.6 F (36.4 C) (Axillary)  Ht 4\' 11"  (1.499 m)  Wt 157 lb (71.215 kg)  BMI 31.71 kg/m2  LMP 04/18/2011 Gen: Well NAD, wheelchair-bound HEENT: EOMI,  MMM, bilaterally yellow nasal discharge Lungs: CTABL Nl WOB Heart: RRR no MRG Abd: NABS, NT, ND Exts: , warm and well perfused.

## 2011-05-21 ENCOUNTER — Other Ambulatory Visit: Payer: Self-pay | Admitting: Family Medicine

## 2011-05-21 NOTE — Telephone Encounter (Signed)
Refill request

## 2011-05-22 ENCOUNTER — Ambulatory Visit: Payer: Medicaid Other | Admitting: Family Medicine

## 2011-06-06 ENCOUNTER — Ambulatory Visit (INDEPENDENT_AMBULATORY_CARE_PROVIDER_SITE_OTHER): Payer: Medicaid Other | Admitting: Family Medicine

## 2011-06-06 VITALS — BP 99/66 | Temp 98.5°F

## 2011-06-06 DIAGNOSIS — H109 Unspecified conjunctivitis: Secondary | ICD-10-CM

## 2011-06-06 MED ORDER — ERYTHROMYCIN 5 MG/GM OP OINT: TOPICAL_OINTMENT | Freq: Every day | OPHTHALMIC | Status: AC

## 2011-06-06 NOTE — Patient Instructions (Signed)
Nice to meet you both. In happy holidays. I'm giving you a prescription try not to refill it until Friday if the eyes not better. If he started getting fevers, chills or the eye looks much worse please come back and see [place] by Friday otherwise this is likely a virus it will improve in the next 2-3 days. I am giving you a note for work as well as one for Mariellen to stay out of school.  Viral Conjunctivitis Conjunctivitis is an irritation (inflammation) of the clear membrane that covers the white part of the eye (the conjunctiva). The irritation can also happen on the underside of the eyelids. Conjunctivitis makes the eye red or pink in color. This is what is commonly known as pink eye. Viral conjunctivitis can spread easily (contagious). CAUSES   Infection from virus on the surface of the eye.   Infection from the irritation or injury of nearby tissues such as the eyelids or cornea.   More serious inflammation or infection on the inside of the eye.   Other eye diseases.   The use of certain eye medications.  SYMPTOMS  The normally white color of the eye or the underside of the eyelid is usually pink or red in color. The pink eye is usually associated with irritation, tearing and some sensitivity to light. Viral conjunctivitis is often associated with a clear, watery discharge. If a discharge is present, there may also be some blurred vision in the affected eye. DIAGNOSIS  Conjunctivitis is diagnosed by an eye exam. The eye specialist looks for changes in the surface tissues of the eye which take on changes characteristic of the specific types of conjunctivitis. A sample of any discharge may be collected on a Q-Tip (sterile swap). The sample will be sent to a lab to see whether or not the inflammation is caused by bacterial or viral infection. TREATMENT  Viral conjunctivitis will not respond to medicines that kill germs (antibiotics). Treatment is aimed at stopping a bacterial infection on top of  the viral infection. The goal of treatment is to relieve symptoms (such as itching) with antihistamine drops or other eye medications.  HOME CARE INSTRUCTIONS   To ease discomfort, apply a cool, clean wash cloth to your eye for 10 to 20 minutes, 3 to 4 times a day.   Gently wipe away any drainage from the eye with a warm, wet washcloth or a cotton ball.   Wash your hands often with soap and use paper towels to dry.   Do not share towels or washcloths. This may spread the infection.   Change or wash your pillowcase every day.   You should not use eye make-up until the infection is gone.   Stop using contacts lenses. Ask your eye professional how to sterilize or replace them before using again. This depends on the type of contact lenses used.   Do not touch the edge of the eyelid with the eye drop bottle or ointment tube when applying medications to the affected eye. This will stop you from spreading the infection to the other eye or to others.  SEEK IMMEDIATE MEDICAL CARE IF:   The infection has not improved within 3 days of beginning treatment.   A watery discharge from the eye develops.   Pain in the eye increases.   The redness is spreading.   Vision becomes blurred.   An oral temperature above 102 F (38.9 C) develops, or as your caregiver suggests.   Facial pain, redness or  swelling develops.   Any problems that may be related to the prescribed medicine develop.  MAKE SURE YOU:   Understand these instructions.   Will watch your condition.   Will get help right away if you are not doing well or get worse.  Document Released: 06/05/2005 Document Revised: 02/15/2011 Document Reviewed: 01/23/2008 [facility] Patient Information 2012 [place], [place].

## 2011-06-06 NOTE — Progress Notes (Signed)
  Subjective:    Michele [name] is a 25 y.o. female who presents for evaluation of erythema and itching in the left eye. She has noticed the above symptoms for 2 days. Onset was sudden. Patient denies blurred vision, discharge, pain and photophobia. There is a history of other family members with similar symptoms.  The following portions of the patient's history were reviewed and updated as appropriate: allergies, current medications, past family history, past medical history, past social history, past surgical history and problem list. Patient is usually at a day school she does have some intellectual challenge mom is her primary care provider at home otherwise. Review of Systems Pertinent items are noted in HPI.   Objective:    BP 99/66  Temp(Src) 98.5 F (36.9 C) (Oral)  LMP 04/18/2011      General: alert and cooperative  Eyes:  negative findings: lids and lashes normal, pupils equal, round, reactive to light and accomodation and no foreign body with everted lid, positive findings: conjunctiva: 1+ injection  Vision: Not performed  Fluorescein:  no uptake seen     Assessment:    Acute conjunctivitis   Plan:    Discussed the diagnosis and proper care of conjunctivitis.  Stressed household Presenter, broadcasting. School/daycare note written. Ophthalmic ointment per orders. Warm compress to eye(s). Local eye care discussed. Analgesics as needed.  Followup when necessary

## 2011-07-09 ENCOUNTER — Other Ambulatory Visit: Payer: Self-pay | Admitting: Family Medicine

## 2011-07-09 NOTE — Telephone Encounter (Signed)
Refill request

## 2011-07-20 ENCOUNTER — Encounter: Payer: Self-pay | Admitting: Family Medicine

## 2011-07-20 ENCOUNTER — Ambulatory Visit (INDEPENDENT_AMBULATORY_CARE_PROVIDER_SITE_OTHER): Payer: Medicaid Other | Admitting: Family Medicine

## 2011-07-20 DIAGNOSIS — M7989 Other specified soft tissue disorders: Secondary | ICD-10-CM

## 2011-07-20 NOTE — Assessment & Plan Note (Signed)
Agree with previous assessments that this is likely venous insufficiency/positional in nature.  Do not feel there is any need for further workup.  Will try thigh-high compression hose and see back in 1-2 weeks.

## 2011-07-20 NOTE — Patient Instructions (Signed)
I want you to take this prescription to get a thigh high pair of compression stockings from a medical supply company.  Put them on her daily.  Come back to see [place] in ~1 week if this does not seem to help.

## 2011-07-20 NOTE — Progress Notes (Signed)
Subjective: The patient is a 26 y.o. year old female who presents today for swelling of lower extremities.  Has been having problems with this for the last 2-3 months but has gotten significantly worse over the past 1-2 weeks.  No SOB.  Acting normal for her.  No history of cardiac problems.  Mother is mostly worried as she is not able to comfortable wear her leg braces at this time.  Objective:  Filed Vitals:   07/20/11 0838  BP: 184/71  Pulse: 86  Temp: 97.8 F (36.6 C)   Gen: NAD, happy and interactive CV: RRR, no murmurs Resp: CTABL, no crackles Ext: 2+ pulses, trace to 1+ lower extremity edema, faintly pitting.  Assessment/Plan:  Please also see individual problems in problem list for problem-specific plans.

## 2011-07-28 ENCOUNTER — Telehealth: Payer: Self-pay | Admitting: Family Medicine

## 2011-07-28 NOTE — Telephone Encounter (Signed)
Patient needs compression hose, but after being measured needs a 15/20 faxed to [facility] [phone].  The phone is [phone].

## 2011-07-28 NOTE — Telephone Encounter (Signed)
Forwarded to pcp for [employer].Michele [name]

## 2011-07-28 NOTE — Telephone Encounter (Signed)
Have written prescription. Taken to office to be faxed back.

## 2011-08-19 ENCOUNTER — Other Ambulatory Visit: Payer: Self-pay | Admitting: Family Medicine

## 2011-08-20 NOTE — Telephone Encounter (Signed)
Refill request

## 2011-09-14 ENCOUNTER — Other Ambulatory Visit: Payer: Self-pay | Admitting: Family Medicine

## 2011-10-16 ENCOUNTER — Telehealth: Payer: Self-pay | Admitting: Family Medicine

## 2011-10-16 MED ORDER — ALENDRONATE SODIUM 70 MG PO TABS: 70.0000 mg | ORAL_TABLET | ORAL | Status: DC

## 2011-10-16 NOTE — Telephone Encounter (Signed)
Called regarding fosamax requests. Patient stable on med for 2 years, dx of osteoporosis several years ago. I have searched records and now see the DEXA from 2005. Ok to continue therapy for now. Advised to f/u for check up in next 2 months. Mother agreeable.

## 2011-10-20 ENCOUNTER — Other Ambulatory Visit: Payer: Self-pay | Admitting: Family Medicine

## 2011-12-14 ENCOUNTER — Ambulatory Visit (INDEPENDENT_AMBULATORY_CARE_PROVIDER_SITE_OTHER): Payer: Medicaid Other | Admitting: Family Medicine

## 2011-12-14 ENCOUNTER — Encounter: Payer: Self-pay | Admitting: Family Medicine

## 2011-12-14 VITALS — BP 98/64 | Temp 97.4°F

## 2011-12-14 DIAGNOSIS — J069 Acute upper respiratory infection, unspecified: Secondary | ICD-10-CM | POA: Insufficient documentation

## 2011-12-14 MED ORDER — AMOXICILLIN 500 MG PO CAPS: 500.0000 mg | ORAL_CAPSULE | Freq: Two times a day (BID) | ORAL | Status: AC

## 2011-12-14 NOTE — Progress Notes (Signed)
  Subjective:    Patient ID: Michele [name], female    DOB: 05-29-1986, 26 y.o.   MRN: [id number]  HPI  1.  URI:  URI symptoms:  Present for past 7 days.  Describes rhinorrhea, sinus congestion, mild cough.  Has tried Tussin OTC without relief.  Sick contacts are none.  No fevers or chills. No nausea or vomiting.     Review of Systems See HPI above for review of systems.       Objective:   Physical Exam BP 98/64  Temp 97.4 F (36.3 C) (Oral) Gen:  Patient sitting on exam table, appears stated age in no acute distress Head: Normocephalic atraumatic Eyes: EOMI, PERRL, sclera and conjunctiva non-erythematous Nose:  Nasal turbinates grossly enlarged bilaterally. Some exudates noted. Tender to palpation of maxillary sinus  Mouth: Mucosa membranes moist. Tonsils +2, nonenlarged, non-erythematous. Neck: No cervical lymphadenopathy noted Heart:  RRR, no murmurs auscultated. Pulm:  Clear to auscultation bilaterally with good air movement.  No wheezes or rales noted.           Assessment & Plan:

## 2011-12-14 NOTE — Assessment & Plan Note (Signed)
Likely viral illness based on symptoms and history.  No signs of bacterial illness. Symptomatic treatment for now, return if worsening or no improvement in 1 week.  Mom agrees no antibiotics needed at this time.  Did provide patient with Amoxicillin prescription to start next week if she continues to have symptoms or exhibits worsening.  She has history of URIs that develop into sinus infections.

## 2011-12-14 NOTE — Patient Instructions (Addendum)
I have sent in an antibiotic for her if she's still feeling sick come Sunday or Monday.  She should be okay to return to the day program now.

## 2011-12-18 ENCOUNTER — Other Ambulatory Visit: Payer: Self-pay | Admitting: *Deleted

## 2011-12-18 MED ORDER — PANTOPRAZOLE SODIUM 40 MG PO PACK: 40.0000 mg | PACK | Freq: Every day | ORAL | Status: DC

## 2011-12-20 ENCOUNTER — Telehealth: Payer: Self-pay | Admitting: *Deleted

## 2011-12-20 NOTE — Telephone Encounter (Signed)
PA required for protonix. Form completed by MD and faxed to medicaid.

## 2011-12-28 ENCOUNTER — Telehealth: Payer: Self-pay | Admitting: Family Medicine

## 2011-12-28 NOTE — Telephone Encounter (Signed)
Patients mom here to pick up rx for hospital bed as hers broke last night.  Patient's PCP agrees this is indicated.  Not available in clinic today.  I wrote rx.

## 2012-01-24 ENCOUNTER — Other Ambulatory Visit: Payer: Self-pay | Admitting: Family Medicine

## 2012-01-24 ENCOUNTER — Telehealth: Payer: Self-pay | Admitting: *Deleted

## 2012-01-24 MED ORDER — PANTOPRAZOLE SODIUM 40 MG PO PACK: 40.0000 mg | PACK | Freq: Every day | ORAL | Status: DC

## 2012-01-24 NOTE — Telephone Encounter (Signed)
PA required for Protonix. Form placed in MD box.

## 2012-01-24 NOTE — Telephone Encounter (Signed)
Approval received effective today for one year. Spoke with a medicaid rep.

## 2012-02-10 ENCOUNTER — Encounter (HOSPITAL_COMMUNITY): Payer: Self-pay | Admitting: *Deleted

## 2012-02-10 ENCOUNTER — Emergency Department (HOSPITAL_COMMUNITY)
Admission: EM | Admit: 2012-02-10 | Discharge: 2012-02-10 | Disposition: A | Discharge status: Home / Self Care | Payer: Medicaid Other | Attending: Emergency Medicine | Admitting: Emergency Medicine

## 2012-02-10 DIAGNOSIS — Z79899 Other long term (current) drug therapy: Secondary | ICD-10-CM | POA: Insufficient documentation

## 2012-02-10 DIAGNOSIS — R609 Edema, unspecified: Secondary | ICD-10-CM | POA: Insufficient documentation

## 2012-02-10 DIAGNOSIS — G809 Cerebral palsy, unspecified: Secondary | ICD-10-CM | POA: Insufficient documentation

## 2012-02-10 DIAGNOSIS — F73 Profound intellectual disabilities: Secondary | ICD-10-CM | POA: Insufficient documentation

## 2012-02-10 LAB — POCT I-STAT, CHEM 8
Glucose, Bld: 104 mg/dL — ABNORMAL HIGH (ref 70–99)
HCT: 35 % — ABNORMAL LOW (ref 36.0–46.0)
Hemoglobin: 11.9 g/dL — ABNORMAL LOW (ref 12.0–15.0)
Potassium: 4.1 mEq/L (ref 3.5–5.1)
TCO2: 20 mmol/L (ref 0–100)

## 2012-02-10 MED ORDER — FUROSEMIDE 20 MG PO TABS: 20.0000 mg | ORAL_TABLET | Freq: Every day | ORAL | Status: DC

## 2012-02-10 NOTE — ED Notes (Signed)
Per patient's mother, patient has generalized swelling.  Per mom, her sister told the mother to bring child to hospital to be evaluated since she swelling all over.  Mom denies pain or any symptoms for the patient.  Patient is a special needs child and uses wheelchair

## 2012-02-10 NOTE — ED Provider Notes (Signed)
History     CSN: [id number]  Arrival date & time 02/10/12  1728   First MD Initiated Contact with Patient 02/10/12 1753      Chief Complaint  Patient presents with  . Leg Swelling    Level V caveat due to cerebral palsy HPI The patient has history of profound mental retardation. She lives with her mother in usually sits in a wheelchair. She has history of having trouble with leg swelling in the past. Patient previously used to be on a particular medication that  sounds like some type of diuretic. Patient was taken off of that in the past. Mother also states she stopped using her TED hose over the last week because they seem to be causing discomfort to the patient. The swelling seems to have progressed since taking the TED hose off. Family member noticed that her feet looked more swollen and suggested she go to the emergency room to be evaluated the patient has not had any trouble breathing. She has not had any fevers or vomiting. Mom has not noticed any facial swelling. The swelling is primarily been confined to both feet and lower extremities. Past Medical History  Diagnosis Date  . Cerebral palsy   . Lennox-Gastaut syndrome   . Profound mental retardation   . Osteoporosis   . Rickets   . Hypothyroidism   . Seizures   . Reflux     Past Surgical History  Procedure Date  . Peg tube placement     Family History  Problem Relation Age of Onset  . Diabetes      MGGM  . Heart disease      MGGF  . Kidney disease      Maternal Great Aunt   . Colon cancer Neg Hx     History  Substance Use Topics  . Smoking status: Never Smoker   . Smokeless tobacco: Never Used  . Alcohol Use: No    OB History    Grav Para Term Preterm Abortions TAB SAB Ect Mult Living                  Review of Systems  All other systems reviewed and are negative.    Allergies  Carbamazepine  Home Medications   Current Outpatient Rx  Name Route Sig Dispense Refill  . ALENDRONATE SODIUM 70  MG PO TABS Oral Take 70 mg by mouth every 7 (seven) days. Take with a full glass of water on an empty stomach.    Marland Kitchen CALCIUM CARBONATE-VITAMIN D 600-400 MG-UNIT PO TABS Per Tube 1 tablet by Per Tube route 2 (two) times daily. 1 tab per tube 2 time a day     . CLOTRIMAZOLE-BETAMETHASONE 1-0.05 % EX CREA Topical Apply topically as needed. Apply to rash on neck as needed     . DIVALPROEX SODIUM 125 MG PO TBEC PEG Tube 125 mg by PEG Tube route 3 (three) times daily.      Marland Kitchen FOLIC ACID 1 MG PO TABS PEG Tube 1 mg by PEG Tube route daily. 1 tab per tube daily     . LACTULOSE 10 GM/15ML PO SOLN Oral Take 20 g by mouth 3 (three) times daily.    Marland Kitchen LEVETIRACETAM 100 MG/ML PO SOLN Per Tube 10 mg/kg by Per Tube route 2 (two) times daily. For seizures per Dr. Sharene Skeans     . LEVOCARNITINE 1 GM/10ML PO SOLN Oral Take 100 mg by mouth 3 (three) times daily. 2 teaspoons per tube 3  x a day    . PANTOPRAZOLE 40 MG/20 ML SUSPENSION Oral Take 40 mg by mouth daily at 12 noon.    . TOPIRAMATE 25 MG PO CPSP Oral Take 25 mg by mouth 2 (two) times daily. 6 tablets by mouth 2 x a day for seizures per Dr. Sharene Skeans       BP 111/54  Pulse 85  Temp 97.5 F (36.4 C) (Oral)  Resp 18  SpO2 100%  LMP 01/20/2012  Physical Exam  Nursing note and vitals reviewed. Constitutional: No distress.       Obese, smiling during exam  HENT:  Head: Normocephalic and atraumatic.  Right Ear: External ear normal.  Left Ear: External ear normal.  Eyes: Conjunctivae are normal. Right eye exhibits no discharge. Left eye exhibits no discharge. No scleral icterus.  Neck: Neck supple. No tracheal deviation present.  Cardiovascular: Normal rate, regular rhythm and intact distal pulses.   Pulmonary/Chest: Effort normal and breath sounds normal. No stridor. No respiratory distress. She has no wheezes. She has no rales.  Abdominal: Soft. Bowel sounds are normal. She exhibits no distension. There is no tenderness. There is no rebound and no guarding.   Musculoskeletal: She exhibits edema. She exhibits no tenderness.       Mild tenderness bilateral feet and lower extremities, no pitting noted, no erythema no tenderness  Neurological: She is alert. No cranial nerve deficit or sensory deficit. She exhibits abnormal muscle tone. She displays no seizure activity. Coordination abnormal.       Patient unable to ambulate, diminished movement of both lower extremities  Skin: Skin is warm and dry. No rash noted.  Psychiatric: She has a normal mood and affect.    ED Course  Procedures (including critical care time)  Labs Reviewed  POCT I-STAT, CHEM 8 - Abnormal; Notable for the following:    Chloride 114 (*)     Glucose, Bld 104 (*)     Calcium, Ion 1.26 (*)     Hemoglobin 11.9 (*)     HCT 35.0 (*)     All other components within normal limits   No results found.   1. Peripheral edema       MDM  Doubt DVT.  Bilateral swelling without pain.  History of peripheral edema in the past.  Will try low dose diuretic for a few days.  Recc using the compression stockings       Celene Kras, MD 02/11/12 [phone]

## 2012-02-10 NOTE — ED Notes (Signed)
MD at bedside.  EDP Linwood Dibbles present to evaluate this pt. Mother present

## 2012-02-15 ENCOUNTER — Encounter: Payer: Self-pay | Admitting: Family Medicine

## 2012-02-15 ENCOUNTER — Ambulatory Visit (INDEPENDENT_AMBULATORY_CARE_PROVIDER_SITE_OTHER): Payer: Medicaid Other | Admitting: Family Medicine

## 2012-02-15 VITALS — BP 118/66 | HR 96 | Temp 99.2°F | Wt 184.0 lb

## 2012-02-15 DIAGNOSIS — M7989 Other specified soft tissue disorders: Secondary | ICD-10-CM

## 2012-02-15 NOTE — Patient Instructions (Addendum)
Limit salty foods like chips, canned foods, and choose low sodium freezer meals  Keep leg elevated when sitting or laying  Keep using compression stocking  If you notice worsening of swelling, tight skin, redness, or other concerns, please come back for a recheck

## 2012-02-15 NOTE — Assessment & Plan Note (Signed)
Chronic, intermittent.  Advised continuing to wear compression stockings.  Work on elevation and low salt diet.  Advised against refilling lasix- cr slightly elevated.  Discussed red flags for follow-up.  Will need labwork including TSH (appears to be overdue)

## 2012-02-15 NOTE — Progress Notes (Signed)
  Subjective:    Patient ID: Michele [name], female    DOB: 1986/05/24, 26 y.o.   MRN: [id number]  HPI Here for ER follow-up  Was seen in ER 5 days ago for bilateral leg edema.  Has had this chronically and flares from time to time.  Was given 3 days of Lasix 40 mg to take at home.  Now out.  Otherwise Michele [name] is in her baseline state of health with no fever, chills, dyspnea, emesis, diarrhea.  Was unable to afford thigh high compression hose.  Is wearing knee high, appears to be mild compression stockings.  High salt diet.  Eating chips, hamburgers, sending frozen meals to school.  Review of Systems See HPI    Objective:   Physical Exam GEN:  NAD, nonverbal.  In wheelchair CV: RRR no m/r/g Lungs: CTAB Abdomen: soft Legs:  1+ edema of legs and feet.  Warm.  Good cap refill.  Skin intact, not tight or erythematous       Assessment & Plan:

## 2012-02-22 ENCOUNTER — Ambulatory Visit (INDEPENDENT_AMBULATORY_CARE_PROVIDER_SITE_OTHER): Payer: Medicaid Other | Admitting: *Deleted

## 2012-02-22 ENCOUNTER — Ambulatory Visit (INDEPENDENT_AMBULATORY_CARE_PROVIDER_SITE_OTHER): Payer: Medicaid Other | Admitting: Family Medicine

## 2012-02-22 VITALS — BP 107/69 | HR 80 | Temp 98.0°F

## 2012-02-22 DIAGNOSIS — M7989 Other specified soft tissue disorders: Secondary | ICD-10-CM

## 2012-02-22 DIAGNOSIS — R600 Localized edema: Secondary | ICD-10-CM

## 2012-02-22 DIAGNOSIS — R609 Edema, unspecified: Secondary | ICD-10-CM

## 2012-02-22 NOTE — Progress Notes (Signed)
  Subjective:    Patient ID: Michele [name], female    DOB: April 06, 1986, 26 y.o.   MRN: [id number]  HPI  Patient presents to same-day clinic for bilateral leg swelling and recheck blood pressure. Patient was seen recently here and in ED for leg swelling. In the ED, she was given a prescription for Lasix which she took for 3 days. This did not improve leg swelling. Per Dr. Leonie Green last note, patient was advised to wear compression hose and avoid salty foods.  Patient's caretaker is present in room today. She states that she will start using compression hose when school starts. She is also waiting for a new wheelchair that will allow patient to elevate her feet. Caretaker is concerned about patient's thyroid. Patient was taking Synthroid throughout her childhood, but was taken off of it 2 years ago. She is concerned that the edema could be secondary to her thyroid.  Patient is very playful and happy in the room.  Normal appetite, still eating salty foods. Caretaker denies any fever, chills, night sweats, complaints of pain, or skin breakdown.   Review of Systems  Per HPI    Objective:   Physical Exam  Constitutional: No distress.       Alert, active, playful  HENT:  Mouth/Throat: Oropharynx is clear and moist.  Cardiovascular: Normal rate and regular rhythm.   Pulmonary/Chest: Effort normal and breath sounds normal. She has no wheezes. She has no rales.  Abdominal: Soft. She exhibits no distension. There is no tenderness.  Musculoskeletal:       Wheel-chair bound, 1+ pitting edema bilaterally, no skin breakdown, intact pulses  Skin: No rash noted. No erythema.       Assessment & Plan:

## 2012-02-22 NOTE — Patient Instructions (Addendum)
Your blood pressure was normal today. Please wear compression hose during the day/at school. At bedtime and resting, please elevate both feet over head like we talked about. Avoid foods high in salt and drink mostly water. We will call you or send a letter with thyroid results. Please schedule an annual complete physical with your PCP at your earliest convenience.  Peripheral Edema You have swelling in your legs (peripheral edema). This swelling is due to excess accumulation of salt and water in your body. Edema may be a sign of heart, kidney or liver disease, or a side effect of a medication. It may also be due to problems in the leg veins. Elevating your legs and using special support stockings may be very helpful, if the cause of the swelling is due to poor venous circulation. Avoid long periods of standing, whatever the cause. Treatment of edema depends on identifying the cause. Chips, pretzels, pickles and other salty foods should be avoided. Restricting salt in your diet is almost always needed. Water pills (diuretics) are often used to remove the excess salt and water from your body via urine. These medicines prevent the kidney from reabsorbing sodium. This increases urine flow. Diuretic treatment may also result in lowering of potassium levels in your body. Potassium supplements may be needed if you have to use diuretics daily. Daily weights can help you keep track of your progress in clearing your edema. You should call your caregiver for follow up care as recommended. SEEK IMMEDIATE MEDICAL CARE IF:   You have increased swelling, pain, redness, or heat in your legs.   You develop shortness of breath, especially when lying down.   You develop chest or abdominal pain, weakness, or fainting.   You have a fever.  Document Released: 07/13/2004 Document Revised: 05/25/2011 Document Reviewed: 06/23/2009 [facility] Patient Information 2012 [place], [place].

## 2012-02-22 NOTE — Progress Notes (Signed)
Patient in for BP check  . Last OV 08/29.  BP LA 107/69 pulse 80.    Swelling of hands , feet and legs. Mother denies any difficulty with breathing. Mother states this is the reason she went to ED on 08/24.Marland Kitchen Appointment scheduled now for work in .

## 2012-02-22 NOTE — Assessment & Plan Note (Signed)
Patient chronic, intermittent bilateral pedal edema.  She is wheel-chair bound and has not been compliant with compression hose. Advised mother to apply hose during the day/at school and to elevate feet over head at bedtime. Will check TSH today.  She used to take synthroid in the past. This is likely positional in etiology - patient also eats a high sodium diet.  Encouraged DASH diet. Patient to schedule annual physical with PCP this year.

## 2012-02-23 ENCOUNTER — Encounter: Payer: Self-pay | Admitting: Family Medicine

## 2012-03-15 ENCOUNTER — Encounter: Payer: Self-pay | Admitting: Family Medicine

## 2012-03-15 ENCOUNTER — Ambulatory Visit (INDEPENDENT_AMBULATORY_CARE_PROVIDER_SITE_OTHER): Payer: Medicaid Other | Admitting: Family Medicine

## 2012-03-15 VITALS — BP 109/63 | HR 110 | Temp 97.5°F

## 2012-03-15 DIAGNOSIS — M810 Age-related osteoporosis without current pathological fracture: Secondary | ICD-10-CM

## 2012-03-15 DIAGNOSIS — R739 Hyperglycemia, unspecified: Secondary | ICD-10-CM

## 2012-03-15 DIAGNOSIS — E039 Hypothyroidism, unspecified: Secondary | ICD-10-CM

## 2012-03-15 DIAGNOSIS — R7309 Other abnormal glucose: Secondary | ICD-10-CM

## 2012-03-15 DIAGNOSIS — M7989 Other specified soft tissue disorders: Secondary | ICD-10-CM

## 2012-03-15 DIAGNOSIS — F73 Profound intellectual disabilities: Secondary | ICD-10-CM

## 2012-03-15 DIAGNOSIS — R569 Unspecified convulsions: Secondary | ICD-10-CM

## 2012-03-15 NOTE — Assessment & Plan Note (Signed)
Mother is happy with school activities and therapies currently. I agree to sign for wheelchair with leg raises if this paperwork is sent to office.

## 2012-03-15 NOTE — Patient Instructions (Addendum)
Nice to see you again. Keep wearing the compression stockings. Keep check on Michele [name]'s weight. She could lose 30 lbs to decrease risk of diabetes and hypertension.  Health Maintenance, Females A healthy lifestyle and preventative care can promote health and wellness.  Maintain regular health, dental, and eye exams.   Eat a healthy diet. Foods like vegetables, fruits, whole grains, low-fat dairy products, and lean protein foods contain the nutrients you need without too many calories. Decrease your intake of foods high in solid fats, added sugars, and salt. Get information about a proper diet from your caregiver, if necessary.   Regular physical exercise is one of the most important things you can do for your health. Most adults should get at least 150 minutes of moderate-intensity exercise (any activity that increases your heart rate and causes you to sweat) each week. In addition, most adults need muscle-strengthening exercises on 2 or more days a week.    Maintain a healthy weight. The body mass index (BMI) is a screening tool to identify possible weight problems. It provides an estimate of body fat based on height and weight. Your caregiver can help determine your BMI, and can help you achieve or maintain a healthy weight. For adults 20 years and older:   A BMI below 18.5 is considered underweight.   A BMI of 18.5 to 24.9 is normal.   A BMI of 25 to 29.9 is considered overweight.   A BMI of 30 and above is considered obese.   Maintain normal blood lipids and cholesterol by exercising and minimizing your intake of saturated fat. Eat a balanced diet with plenty of fruits and vegetables. Blood tests for lipids and cholesterol should begin at age 51 and be repeated every 5 years. If your lipid or cholesterol levels are high, you are over 50, or you are a high risk for heart disease, you may need your cholesterol levels checked more frequently.Ongoing high lipid and cholesterol levels should be  treated with medicines if diet and exercise are not effective.   If you smoke, find out from your caregiver how to quit. If you do not use tobacco, do not start.   If you are pregnant, do not drink alcohol. If you are breastfeeding, be very cautious about drinking alcohol. If you are not pregnant and choose to drink alcohol, do not exceed 1 drink per day. One drink is considered to be 12 ounces (355 mL) of beer, 5 ounces (148 mL) of wine, or 1.5 ounces (44 mL) of liquor.   Avoid use of street drugs. Do not share needles with anyone. Ask for help if you need support or instructions about stopping the use of drugs.   High blood pressure causes heart disease and increases the risk of stroke. Blood pressure should be checked at least every 1 to 2 years. Ongoing high blood pressure should be treated with medicines, if weight loss and exercise are not effective.   If you are 25 to 25 years old, ask your caregiver if you should take aspirin to prevent strokes.   Diabetes screening involves taking a blood sample to check your fasting blood sugar level. This should be done once every 3 years, after age 12, if you are within normal weight and without risk factors for diabetes. Testing should be considered at a younger age or be carried out more frequently if you are overweight and have at least 1 risk factor for diabetes.   Breast cancer screening is essential preventative care for  women. You should practice "breast self-awareness." This means understanding the normal appearance and feel of your breasts and may include breast self-examination. Any changes detected, no matter how small, should be reported to a caregiver. Women in their 35s and 30s should have a clinical breast exam (CBE) by a caregiver as part of a regular health exam every 1 to 3 years. After age 74, women should have a CBE every year. Starting at age 81, women should consider having a mammogram (breast X-ray) every year. Women who have a family  history of breast cancer should talk to their caregiver about genetic screening. Women at a high risk of breast cancer should talk to their caregiver about having an MRI and a mammogram every year.   The Pap test is a screening test for cervical cancer. Women should have a Pap test starting at age 59. Between ages 48 and 68, Pap tests should be repeated every 2 years. Beginning at age 92, you should have a Pap test every 3 years as long as the past 3 Pap tests have been normal. If you had a hysterectomy for a problem that was not cancer or a condition that could lead to cancer, then you no longer need Pap tests. If you are between ages 10 and 12, and you have had normal Pap tests going back 10 years, you no longer need Pap tests. If you have had past treatment for cervical cancer or a condition that could lead to cancer, you need Pap tests and screening for cancer for at least 20 years after your treatment. If Pap tests have been discontinued, risk factors (such as a new sexual partner) need to be reassessed to determine if screening should be resumed. Some women have medical problems that increase the chance of getting cervical cancer. In these cases, your caregiver may recommend more frequent screening and Pap tests.   The human papillomavirus (HPV) test is an additional test that may be used for cervical cancer screening. The HPV test looks for the virus that can cause the cell changes on the cervix. The cells collected during the Pap test can be tested for HPV. The HPV test could be used to screen women aged 32 years and older, and should be used in women of any age who have unclear Pap test results. After the age of 78, women should have HPV testing at the same frequency as a Pap test.   Colorectal cancer can be detected and often prevented. Most routine colorectal cancer screening begins at the age of 37 and continues through age 47. However, your caregiver may recommend screening at an earlier age if  you have risk factors for colon cancer. On a yearly basis, your caregiver may provide home test kits to check for hidden blood in the stool. Use of a small camera at the end of a tube, to directly examine the colon (sigmoidoscopy or colonoscopy), can detect the earliest forms of colorectal cancer. Talk to your caregiver about this at age 59, when routine screening begins. Direct examination of the colon should be repeated every 5 to 10 years through age 31, unless early forms of pre-cancerous polyps or small growths are found.   Hepatitis C blood testing is recommended for all people born from [birth year] through 1964 and any individual with known risks for hepatitis C.   Practice safe sex. Use condoms and avoid high-risk sexual practices to reduce the spread of sexually transmitted infections (STIs). Sexually active women aged 26 and younger  should be checked for Chlamydia, which is a common sexually transmitted infection. Older women with new or multiple partners should also be tested for Chlamydia. Testing for other STIs is recommended if you are sexually active and at increased risk.   Osteoporosis is a disease in which the bones lose minerals and strength with aging. This can result in serious bone fractures. The risk of osteoporosis can be identified using a bone density scan. Women ages 69 and over and women at risk for fractures or osteoporosis should discuss screening with their caregivers. Ask your caregiver whether you should be taking a calcium supplement or vitamin D to reduce the rate of osteoporosis.   Menopause can be associated with physical symptoms and risks. Hormone replacement therapy is available to decrease symptoms and risks. You should talk to your caregiver about whether hormone replacement therapy is right for you.   Use sunscreen with a sun protection factor (SPF) of 30 or greater. Apply sunscreen liberally and repeatedly throughout the day. You should seek shade when your shadow is  shorter than you. Protect yourself by wearing long sleeves, pants, a wide-brimmed hat, and sunglasses year round, whenever you are outdoors.   Notify your caregiver of new moles or changes in moles, especially if there is a change in shape or color. Also notify your caregiver if a mole is larger than the size of a pencil eraser.   Stay current with your immunizations.  Document Released: 12/19/2010 Document Revised: 05/25/2011 Document Reviewed: 12/19/2010 [facility] Patient Information 2012 [place], [place].

## 2012-03-15 NOTE — Assessment & Plan Note (Signed)
Has negative BNP, d dimer, renal function. No sign of secondary cause other than positional and venous insufficiency. Will bring in for fasting labs and LFTs/alb to complete lab evaluation.

## 2012-03-15 NOTE — Assessment & Plan Note (Signed)
TSH wnl this month. No replacement now.

## 2012-03-15 NOTE — Assessment & Plan Note (Signed)
Continue therapy for at least 2 more years, recheck BMD around 2015. Cannot find past report of BMD in Epic.

## 2012-03-15 NOTE — Progress Notes (Signed)
  Subjective:    Patient ID: Michele [name], female    DOB: January 09, 1986, 26 y.o.   MRN: [id number]  HPI  CPE, refuses pap.  1. Seizure disorder. Followed by Dr. Sharene Skeans for many years. On a new medicine mother calls "Baz___", she is unsure of name.   2. 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 Attends [employer] [facility] for school. Mother walks with her sometimes with a gait belt, but spends most of time in a wheelchair. States the PT [facility] wants to get a wheelchair with leg lifts, supposed to send over the order to clinic from [facility] [employer].  3. Weight gain. Has a good appetite and likes to eat sweets. Got into the cake for breakfast today.  Wt Readings from Last 3 Encounters:  02/15/12 184 lb (83.462 kg)  05/19/11 157 lb (71.215 kg)  02/10/11 154 lb 6.4 oz (70.035 kg)   4. Leg swelling. This improves after elevation sometimes. Has seen [facility] docs and also ED visit for this. Lab workup has been negative thus far. Wearing stockings more regularly now. Denies any cough, dyspnea, other swelling.   5. Osteoporosis. Mother states she has been on fosamax about 2 years now. No fractures. No esophagitis.   Review of Systems See HPI otherwise negative.  reports that she has never smoked. She has never used smokeless tobacco.     Objective:   Physical Exam  Vitals reviewed. Constitutional: She appears well-developed and well-nourished.       Very happy, high energy today. Smiles with repetitive hand clapping.   HENT:  Head: Normocephalic.       Left upper teeth missing (after fall last yr). Mmm.  Cardiovascular: Normal rate, regular rhythm, normal heart sounds and intact distal pulses.   No murmur heard. Pulmonary/Chest: Effort normal and breath sounds normal. No respiratory distress. She has no wheezes. She has no rales.  Abdominal: Soft. Bowel sounds are normal. She exhibits no distension. There is no tenderness. There is no rebound.  Musculoskeletal:       Compression stockings in place.  Bilateral mild LE edema noted.    Neurological: She is alert.  Skin: No rash noted.          Assessment & Plan:

## 2012-03-19 ENCOUNTER — Telehealth: Payer: Self-pay | Admitting: *Deleted

## 2012-03-19 NOTE — Telephone Encounter (Signed)
Spoke with patient's mother and informed her that A1C is normal. Fasting labs set for 10/4 @ 8:45am

## 2012-03-22 ENCOUNTER — Ambulatory Visit (INDEPENDENT_AMBULATORY_CARE_PROVIDER_SITE_OTHER): Payer: Medicaid Other | Admitting: *Deleted

## 2012-03-22 ENCOUNTER — Other Ambulatory Visit: Payer: Medicaid Other

## 2012-03-22 DIAGNOSIS — Z23 Encounter for immunization: Secondary | ICD-10-CM

## 2012-03-22 DIAGNOSIS — M7989 Other specified soft tissue disorders: Secondary | ICD-10-CM

## 2012-03-22 LAB — CBC WITH DIFFERENTIAL/PLATELET
Basophils Absolute: 0 10*3/uL (ref 0.0–0.1)
Basophils Relative: 0 % (ref 0–1)
Lymphocytes Relative: 38 % (ref 12–46)
MCHC: 32.8 g/dL (ref 30.0–36.0)
Neutro Abs: 4.7 10*3/uL (ref 1.7–7.7)
Platelets: 290 10*3/uL (ref 150–400)
RDW: 19.6 % — ABNORMAL HIGH (ref 11.5–15.5)
WBC: 9 10*3/uL (ref 4.0–10.5)

## 2012-03-22 LAB — LIPID PANEL
HDL: 60 mg/dL (ref >39)
Triglycerides: 60 mg/dL (ref <150)

## 2012-03-22 LAB — COMPREHENSIVE METABOLIC PANEL
ALT: 9 U/L (ref 0–35)
AST: 16 U/L (ref 0–37)
Albumin: 3.6 g/dL (ref 3.5–5.2)
CO2: 19 mEq/L (ref 19–32)
Calcium: 9.1 mg/dL (ref 8.4–10.5)
Chloride: 112 mEq/L (ref 96–112)
Potassium: 4.2 mEq/L (ref 3.5–5.3)
Sodium: 139 mEq/L (ref 135–145)
Total Protein: 6.8 g/dL (ref 6.0–8.3)

## 2012-03-22 NOTE — Progress Notes (Signed)
Drew CMET, CBC, FLP

## 2012-03-25 ENCOUNTER — Telehealth: Payer: Self-pay | Admitting: Family Medicine

## 2012-03-25 NOTE — Telephone Encounter (Signed)
Discussed lab work with her mother. Normal except mild anemia 10.8, not typical but Michele [name] is menstruating. Asymptomatic. Advised we will f/u at next visit. Consider starting iron if this worsens.

## 2012-06-20 ENCOUNTER — Encounter (HOSPITAL_COMMUNITY): Payer: Self-pay | Admitting: Emergency Medicine

## 2012-06-20 ENCOUNTER — Emergency Department (HOSPITAL_COMMUNITY)
Admission: EM | Admit: 2012-06-20 | Discharge: 2012-06-20 | Disposition: A | Discharge status: Home / Self Care | Payer: Medicaid Other | Attending: Emergency Medicine | Admitting: Emergency Medicine

## 2012-06-20 DIAGNOSIS — Z79899 Other long term (current) drug therapy: Secondary | ICD-10-CM | POA: Insufficient documentation

## 2012-06-20 DIAGNOSIS — G40309 Generalized idiopathic epilepsy and epileptic syndromes, not intractable, without status epilepticus: Secondary | ICD-10-CM | POA: Insufficient documentation

## 2012-06-20 DIAGNOSIS — Z862 Personal history of diseases of the blood and blood-forming organs and certain disorders involving the immune mechanism: Secondary | ICD-10-CM | POA: Insufficient documentation

## 2012-06-20 DIAGNOSIS — G40909 Epilepsy, unspecified, not intractable, without status epilepticus: Secondary | ICD-10-CM | POA: Insufficient documentation

## 2012-06-20 DIAGNOSIS — F73 Profound intellectual disabilities: Secondary | ICD-10-CM | POA: Insufficient documentation

## 2012-06-20 DIAGNOSIS — K219 Gastro-esophageal reflux disease without esophagitis: Secondary | ICD-10-CM | POA: Insufficient documentation

## 2012-06-20 DIAGNOSIS — Z8639 Personal history of other endocrine, nutritional and metabolic disease: Secondary | ICD-10-CM | POA: Insufficient documentation

## 2012-06-20 DIAGNOSIS — M810 Age-related osteoporosis without current pathological fracture: Secondary | ICD-10-CM | POA: Insufficient documentation

## 2012-06-20 DIAGNOSIS — G809 Cerebral palsy, unspecified: Secondary | ICD-10-CM | POA: Insufficient documentation

## 2012-06-20 DIAGNOSIS — R609 Edema, unspecified: Secondary | ICD-10-CM | POA: Insufficient documentation

## 2012-06-20 DIAGNOSIS — E039 Hypothyroidism, unspecified: Secondary | ICD-10-CM | POA: Insufficient documentation

## 2012-06-20 LAB — COMPREHENSIVE METABOLIC PANEL
ALT: 11 U/L (ref 0–35)
AST: 26 U/L (ref 0–37)
CO2: 23 mEq/L (ref 19–32)
Calcium: 9.6 mg/dL (ref 8.4–10.5)
Chloride: 108 mEq/L (ref 96–112)
Creatinine, Ser: 0.69 mg/dL (ref 0.50–1.10)
GFR calc Af Amer: >90 mL/min (ref >90)
GFR calc non Af Amer: >90 mL/min (ref >90)
Glucose, Bld: 88 mg/dL (ref 70–99)
Sodium: 138 mEq/L (ref 135–145)
Total Bilirubin: 0.1 mg/dL — ABNORMAL LOW (ref 0.3–1.2)

## 2012-06-20 MED ORDER — FUROSEMIDE 20 MG PO TABS
20.0000 mg | ORAL_TABLET | Freq: Once | ORAL | Status: AC
  Administered 2012-06-20: 20 mg via ORAL
  Filled 2012-06-20: qty 1

## 2012-06-20 MED ORDER — FUROSEMIDE 20 MG PO TABS: 20.0000 mg | ORAL_TABLET | Freq: Two times a day (BID) | ORAL | Status: DC

## 2012-06-20 NOTE — ED Provider Notes (Signed)
History     CSN: [id number]  Arrival date & time 06/20/12  0941   First MD Initiated Contact with Patient 06/20/12 1025      Chief Complaint  Patient presents with  . Leg Swelling    (Consider location/radiation/quality/duration/timing/severity/associated sxs/prior treatment) HPI Comments: Patient with history of CP, presents with mother with complaint of 4 days of increase in her chronic lower extremity edema. This has not been associated with fever, shortness of breath, coughing, changes in skin color of lower extremities. Mother states that she was unable to get an appointment with her primary care physician today and came to the emergency department for evaluation. Mother states that last time the patient was seen in emergency department, she was treated with a short course of Lasix. Mother states that this helped and she is requesting this again today. Family practice did not continue this medication due to concerns over renal function. Mother states that she is able to fully place the TED hose because of the swelling. Patient has been using a wheelchair with elevated legs for the past one month that does not seem to be improving symptoms. No other medical complaints. Onset gradual. Course is gradually worsening. Nothing makes symptoms better or worse. No other new medications. Mother states, regarding her diet, "she has a little bit of everything".  The history is provided by the patient.    Past Medical History  Diagnosis Date  . Cerebral palsy   . Lennox-Gastaut syndrome   . Profound mental retardation   . Osteoporosis   . Rickets   . Hypothyroidism   . Seizures   . Reflux     Past Surgical History  Procedure Date  . Peg tube placement     Family History  Problem Relation Age of Onset  . Diabetes      MGGM  . Heart disease      MGGF  . Kidney disease      Maternal Great Aunt   . Colon cancer Neg Hx   . Hypertension Mother   . Cancer Maternal Aunt     lung  .  Heart disease Maternal Grandmother   . Stroke Maternal Grandfather   . Hypertension Paternal Grandmother     History  Substance Use Topics  . Smoking status: Never Smoker   . Smokeless tobacco: Never Used  . Alcohol Use: No    OB History    Grav Para Term Preterm Abortions TAB SAB Ect Mult Living                  Review of Systems  Constitutional: Negative for fever.  HENT: Negative for sore throat and rhinorrhea.   Eyes: Negative for redness.  Respiratory: Negative for cough, shortness of breath and wheezing.   Cardiovascular: Positive for leg swelling (bilateral, no pain). Negative for chest pain.  Gastrointestinal: Negative for nausea, vomiting, abdominal pain and diarrhea.  Genitourinary: Negative for dysuria.  Musculoskeletal: Negative for myalgias.  Skin: Negative for rash.  Neurological: Negative for headaches.    Allergies  Carbamazepine  Home Medications   Current Outpatient Rx  Name  Route  Sig  Dispense  Refill  . ALENDRONATE SODIUM 70 MG PO TABS   Oral   Take 70 mg by mouth every 7 (seven) days. Takes on Saturdays Take with a full glass of water on an empty stomach.         Marland Kitchen CALCIUM CARBONATE-VITAMIN D 600-400 MG-UNIT PO TABS   Oral   Take  1 tablet by mouth daily. 1 tab per tube 2 time a day         . DIVALPROEX SODIUM 125 MG PO CPSP   Oral   Take 1,000 mg by mouth 2 (two) times daily. Take 8 capsules in the morning and 8 capsules in the evening         . FOLIC ACID 1 MG PO TABS   Oral   Take 1 mg by mouth every morning. 1 tab per tube daily         . LACTULOSE 10 GM/15ML PO SOLN   Oral   Take 20 g by mouth 2 (two) times daily.          Marland Kitchen LEVETIRACETAM 100 MG/ML PO SOLN   Oral   Take 1,200 mg by mouth 2 (two) times daily. For seizures per Dr. Sharene Skeans         . LEVOCARNITINE 1 GM/10ML PO SOLN   Oral   Take 500 mg by mouth 3 (three) times daily.          Marland Kitchen PANTOPRAZOLE 40 MG/20 ML SUSPENSION   Oral   Take 40 mg by mouth  daily at 12 noon.         Marland Kitchen RUFINAMIDE 40 MG/ML PO SUSP   Oral   Take 200 mg by mouth 2 (two) times daily.         . TOPIRAMATE 25 MG PO CPSP   Oral   Take 150 mg by mouth 2 (two) times daily. 6 capsules by mouth twice a day for seizures per Dr. Sharene Skeans           BP 102/82  Pulse 56  Temp 98.4 F (36.9 C) (Oral)  Resp 22  SpO2 100%  LMP 06/05/2012  Physical Exam  Nursing note and vitals reviewed. Constitutional: She appears well-developed and well-nourished.  HENT:  Head: Normocephalic and atraumatic.  Eyes: Conjunctivae normal are normal. Right eye exhibits no discharge. Left eye exhibits no discharge.  Neck: Normal range of motion. Neck supple.  Cardiovascular: Normal rate, regular rhythm and normal heart sounds.   Pulses:      Dorsalis pedis pulses are 2+ on the right side, and 2+ on the left side.       Posterior tibial pulses are 2+ on the right side, and 2+ on the left side.  Pulmonary/Chest: Effort normal and breath sounds normal.  Abdominal: Soft. There is no tenderness.  Musculoskeletal: She exhibits edema. She exhibits no tenderness.       2+ bilateral LE edema, lower extremities are cool to touch but color is normal. Normal cap refill in toes.   Neurological: She is alert.  Skin: Skin is warm and dry.  Psychiatric: She has a normal mood and affect.    ED Course  Procedures (including critical care time)  Labs Reviewed  COMPREHENSIVE METABOLIC PANEL - Abnormal; Notable for the following:    Albumin 3.0 (*)     Total Bilirubin 0.1 (*)     All other components within normal limits  LAB REPORT - SCANNED   No results found.   1. Peripheral edema     10:37 AM Patient seen and examined. Will check renal function and protein.   Vital signs reviewed and are as follows: Filed Vitals:   06/20/12 0949  BP: 102/82  Pulse: 56  Temp: 98.4 F (36.9 C)  Resp: 22   Kidney fcn at baseline. Low dose lasix given in ED. Mother states patient  has PCP  follow-up tomorrow. They can decide if they would like to continue with lasix or take another approach.   Mother urged to return with worsening symptoms or other concerns. Mother verbalized understanding and agrees with plan.     MDM  Given normal baseline renal fcn, short course of low dose lasix given. The hope is this will help reduce swelling to the point the patient can resume TED hose and other basic measures to control edema. This appears to be the patient's usual dependent edema today -- although a component of hypoproteinemia is probably. No concern for infection, DVT, kidney/liver/heart failure. Patient appears well and comfortable.         [place], [place] 06/21/12 [id number]

## 2012-06-20 NOTE — ED Notes (Signed)
Mother states that pt has more swelling than usual. States that she is unable to put TED hose on her daughter due to swelling.

## 2012-06-21 ENCOUNTER — Encounter: Payer: Self-pay | Admitting: Family Medicine

## 2012-06-21 ENCOUNTER — Ambulatory Visit (INDEPENDENT_AMBULATORY_CARE_PROVIDER_SITE_OTHER): Payer: Medicaid Other | Admitting: Family Medicine

## 2012-06-21 VITALS — BP 99/63 | HR 80 | Temp 97.9°F | Wt 194.4 lb

## 2012-06-21 DIAGNOSIS — M7989 Other specified soft tissue disorders: Secondary | ICD-10-CM

## 2012-06-21 MED ORDER — FUROSEMIDE 20 MG PO TABS: 20.0000 mg | ORAL_TABLET | Freq: Every day | ORAL | Status: DC

## 2012-06-21 NOTE — Patient Instructions (Addendum)
Kameryn has extra fluid Keep taking lasix daily. Elevate legs when you can-at home and at night. Use compression stockings when able. Avoid extra salt.  Make appointment in 2 weeks for check up.

## 2012-06-21 NOTE — ED Provider Notes (Signed)
Medical screening examination/treatment/procedure(s) were performed by non-physician practitioner and as supervising physician I was immediately available for consultation/collaboration.  Tobin Chad, MD 06/21/12 1719

## 2012-06-21 NOTE — Progress Notes (Signed)
  Subjective:    Patient ID: Michele [name], female    DOB: 18-Jun-1986, 27 y.o.   MRN: [id number]  HPI  1. Leg swelling. Previous dx of venous insufficiency secondary to being wheel chair bound usually. Worsened past 4 days. Mother thinks was eating extra salty foods like chips and meals at the day center. Went to ED last night because couldn't get an office appt. She CMET with albumin 3.0 otherwise normal. Previously has normal BNP, TSH and CXR to work up her recurrent edema. Has a very good appetite and otherwise participating in therapy per usual.    Review of Systems Mother does not see any evidence of dyspnea, cough, wheezing or chest discomfort, leg pain, fever, or other distress.     Objective:   Physical Exam  Vitals reviewed. Constitutional: She appears well-developed and well-nourished. No distress.  HENT:  Head: Normocephalic and atraumatic.  Eyes: EOM are normal. Pupils are equal, round, and reactive to light.  Cardiovascular: Normal rate, regular rhythm and normal heart sounds.   No murmur heard. Pulmonary/Chest: Effort normal and breath sounds normal. No respiratory distress. She has no wheezes. She has no rales.  Abdominal: There is no tenderness.  Musculoskeletal: She exhibits edema.       1+ symmetric bilateral LE edema to knees. Nontender, no skin changes.  Hands appear slightly edematous. Wheelchair bound.  Skin: No rash noted. She is not diaphoretic.       Assessment & Plan:

## 2012-06-21 NOTE — Assessment & Plan Note (Addendum)
Edema worsened likely from venous insufficiency, gaining weight, eating salty foods, not wearing compression stockings. Will continue lasix 20 mg daily for next 10 days. Mother to ensure her legs are elevated when possible, diet is appropriate. F/u in 1-2 weeks to re-assess weight and exam. If any concerns would repeat CXR and labs/BNP to assess for other etiologies.

## 2012-06-25 ENCOUNTER — Telehealth: Payer: Self-pay | Admitting: Family Medicine

## 2012-06-25 NOTE — Telephone Encounter (Signed)
Michele [name] is faxing over a Certificate of Medical Necessity that she needs completed by Dr. Cristal Dacy and sent back.

## 2012-06-26 ENCOUNTER — Other Ambulatory Visit (HOSPITAL_COMMUNITY): Payer: Self-pay | Admitting: Pediatrics

## 2012-06-26 ENCOUNTER — Ambulatory Visit (HOSPITAL_COMMUNITY)
Admission: RE | Admit: 2012-06-26 | Discharge: 2012-06-26 | Disposition: A | Discharge status: Home / Self Care | Payer: Medicaid Other | Source: Ambulatory Visit | Attending: Pediatrics | Admitting: Pediatrics

## 2012-06-26 DIAGNOSIS — F71 Moderate intellectual disabilities: Secondary | ICD-10-CM

## 2012-06-26 DIAGNOSIS — Z79899 Other long term (current) drug therapy: Secondary | ICD-10-CM

## 2012-06-26 DIAGNOSIS — G40319 Generalized idiopathic epilepsy and epileptic syndromes, intractable, without status epilepticus: Secondary | ICD-10-CM

## 2012-06-26 NOTE — Telephone Encounter (Signed)
Completed. In fax outbox.

## 2012-07-04 ENCOUNTER — Ambulatory Visit (INDEPENDENT_AMBULATORY_CARE_PROVIDER_SITE_OTHER): Payer: Medicaid Other | Admitting: Family Medicine

## 2012-07-04 VITALS — Wt 191.7 lb

## 2012-07-04 DIAGNOSIS — M7989 Other specified soft tissue disorders: Secondary | ICD-10-CM

## 2012-07-04 LAB — COMPREHENSIVE METABOLIC PANEL
BUN: 15 mg/dL (ref 6–23)
CO2: 21 mEq/L (ref 19–32)
Calcium: 9.1 mg/dL (ref 8.4–10.5)
Chloride: 110 mEq/L (ref 96–112)
Creat: 0.82 mg/dL (ref 0.50–1.10)
Total Bilirubin: 0.2 mg/dL — ABNORMAL LOW (ref 0.3–1.2)

## 2012-07-04 NOTE — Progress Notes (Signed)
  Subjective:    Patient ID: Michele [name], female    DOB: [birth year], 27 y.o.   MRN: [id number]  HPI  1. F/u leg swelling. Slight improvement with stockings and lasix. Finished the lasix rx. Trying to keep legs elevated, but would help if she could use geri chair at home which is broken (obtained 13 yrs ago for similar issues). Requests rx for new chair. Has been watching her closely at home instead of going to school, but thinks she can return and requests note to return on Tuesday (closed  Monday).   Review of Systems Mother does not report any dyspnea at rest, fatigue, cough, wheezing, leg redness or decreased activity.    Objective:   Physical Exam  Vitals reviewed. Constitutional: She appears well-nourished. No distress.  HENT:  Head: Normocephalic and atraumatic.  Mouth/Throat: Oropharynx is clear and moist.  Eyes: EOM are normal.  Cardiovascular: Normal rate, regular rhythm and normal heart sounds.   Pulmonary/Chest: Effort normal and breath sounds normal. No respiratory distress. She has no wheezes. She has no rales.       No abnormalities auscultated, but does not respond to commands for deep breaths.  Abdominal: Soft. She exhibits no distension.  Musculoskeletal: She exhibits edema.       1+ B edema. Wearing compression hose. No tenderness.  Neurological:       Non-verbal.  Skin: She is not diaphoretic.          Assessment & Plan:

## 2012-07-04 NOTE — Patient Instructions (Addendum)
NIce to see you. Will check labs today, if normal I will send letter or call if abnormal. Best to keep legs elevated, will get a new geri chair. No more lasix for now. Avoid excess salt. May return to school on Tuesday.  Check up in 2-3 months or sooner if problems arise.

## 2012-07-04 NOTE — Assessment & Plan Note (Signed)
Most likely dependent edema. Will DC lasix and check potassium after low dose diuresis. Remotely had mild cardiomegaly on CXR, so will check BNP to rule out ventricular dysfunction confounding this. Continue support hose. rx for geri care given to replace old one, so she may adequately and safely elevate legs at home. F/u in 2 months or sooner if symptoms worsen.

## 2012-07-05 ENCOUNTER — Encounter: Payer: Self-pay | Admitting: Family Medicine

## 2012-07-05 LAB — BRAIN NATRIURETIC PEPTIDE: Brain Natriuretic Peptide: 6.5 pg/mL (ref 0.0–100.0)

## 2012-08-30 ENCOUNTER — Ambulatory Visit: Payer: Medicaid Other | Admitting: Family Medicine

## 2012-09-05 ENCOUNTER — Encounter: Payer: Self-pay | Admitting: Family Medicine

## 2012-09-05 ENCOUNTER — Ambulatory Visit (INDEPENDENT_AMBULATORY_CARE_PROVIDER_SITE_OTHER): Payer: Medicaid Other | Admitting: Family Medicine

## 2012-09-05 VITALS — BP 111/59 | HR 87 | Wt 192.4 lb

## 2012-09-05 DIAGNOSIS — M7989 Other specified soft tissue disorders: Secondary | ICD-10-CM

## 2012-09-05 DIAGNOSIS — R269 Unspecified abnormalities of gait and mobility: Secondary | ICD-10-CM

## 2012-09-05 DIAGNOSIS — E039 Hypothyroidism, unspecified: Secondary | ICD-10-CM

## 2012-09-05 NOTE — Progress Notes (Signed)
  Subjective:    Patient ID: Michele [name], female    DOB: [birth year], 27 y.o.   MRN: [id number]  HPI  1. Gait abnormality. Mother endorses increased in an effort to ambulation improve her leg edema. She is walking down the sidewalk with a gait belt currently. Reports she gets tired frequently and has to sit on the ground. One of the nurses at the [place] program recommended she may benefit from a rolling walker with a seat. Barriers to normal ambulation include frequent seizures, cerebral palsy, obesity. She was last assessed by formal physical therapy at the [employer], for which she graduated in 2010. She does receive range of motion activity and assisted ambulation at the day program with [employer]. Denies recent falls, worsened weakness  2. leg edema. Symptoms are stable along with weight. We have concluded most likely related to dependent and venous insufficiency. Her labs including BNP were within normal limits. She is not been taking diuretic. She has not been wearing compression stockings because they are hard to roll up past her lower legs. Denies any shortness of breath, change in urination, chest pain, cough, fatigue, decreased appetite.  3. history of G-tube. This was removed last year due to recurrent complications. She currently gets a full oral diet with chopped foods. Mother denies any trouble swallowing, choking. Her weight is stable.  Review of Systems See HPI otherwise negative.  reports that she has never smoked. She has never used smokeless tobacco.     Objective:   Physical Exam  Vitals reviewed. Constitutional: She appears well-developed and well-nourished. No distress.  Michele [name], appears well, make squealing noises happily  HENT:  Head: Normocephalic and atraumatic.  Mouth/Throat: Oropharynx is clear and moist.  Eyes: EOM are normal. Pupils are equal, round, and reactive to light.  Neck: Neck supple. No thyromegaly present.  Cardiovascular: Normal rate,  regular rhythm and normal heart sounds.   No murmur heard. Pulmonary/Chest: Effort normal and breath sounds normal. No respiratory distress. She has no wheezes. She has no rales.  Abdominal: There is no tenderness.  obese  Musculoskeletal: She exhibits no tenderness.  Bilateral legs obese, trace pitting edema but appears mostly secondary to fatty soft tissue. No erythema, tenderness or skin breakdown.  Lymphadenopathy:    She has no cervical adenopathy.  Neurological: She is alert.  Nonverbal, though says clear "thank you" during exam randomly.   Skin: She is not diaphoretic.          Assessment & Plan:

## 2012-09-05 NOTE — Assessment & Plan Note (Signed)
Secondary to fatigue, cerebral palsy, seizure disorders. No recent falls. I encouraged mother to continue her ambulation. We'll make PT referral to assess her gait and need for supplementary DME. Has not been formally assessed since 2010. She may be a good candidate for rolling walker with seat, but will defer this recommendation and for any other equipment to the physical therapist. Given permanent handicap sticker to mother.

## 2012-09-05 NOTE — Patient Instructions (Addendum)
Nice to see you. Michele [name] is doing well. Swelling might improve some with stockings. Good job with extra walking.  Make an appointment for check up in 3 months or sooner if needed.

## 2012-09-05 NOTE — Assessment & Plan Note (Signed)
Due to [facility] and labs in September 2014.

## 2012-09-05 NOTE — Assessment & Plan Note (Signed)
Stable. Likely dependent edema. Her labs, BNP are within normal limits. No significant proteinuria on UA. Recommend leg elevation, resume compression stocking and continue efforts at ambulation.

## 2012-09-10 ENCOUNTER — Telehealth: Payer: Self-pay | Admitting: Family Medicine

## 2012-09-10 NOTE — Telephone Encounter (Signed)
Referral is in [facility]'s work queue.  They will call mom to schedule.  Ileana Ladd

## 2012-09-10 NOTE — Telephone Encounter (Signed)
Mom calling about physical therapy referral.  Want to have this set up asap.  Please call mom back when complete

## 2012-09-16 ENCOUNTER — Telehealth: Payer: Self-pay | Admitting: Family Medicine

## 2012-09-16 ENCOUNTER — Ambulatory Visit: Payer: Medicaid Other | Attending: Family Medicine | Admitting: Physical Therapy

## 2012-09-16 DIAGNOSIS — R269 Unspecified abnormalities of gait and mobility: Secondary | ICD-10-CM | POA: Insufficient documentation

## 2012-09-16 DIAGNOSIS — Z9181 History of falling: Secondary | ICD-10-CM | POA: Insufficient documentation

## 2012-09-16 DIAGNOSIS — IMO0001 Reserved for inherently not codable concepts without codable children: Secondary | ICD-10-CM | POA: Insufficient documentation

## 2012-09-16 NOTE — Telephone Encounter (Signed)
Needs a script written for a roller walker - pls call when ready

## 2012-09-17 NOTE — Telephone Encounter (Signed)
Related message,patient mother states he was seen by PT yesterday. Ahmon Tosi, Virgel Bouquet

## 2012-09-17 NOTE — Telephone Encounter (Signed)
Placed rx up front. Please notify mother. Please ask if they had the PT evaluation also?

## 2012-09-20 ENCOUNTER — Encounter (HOSPITAL_COMMUNITY): Payer: Self-pay | Admitting: Emergency Medicine

## 2012-09-20 ENCOUNTER — Emergency Department (HOSPITAL_COMMUNITY)
Admission: EM | Admit: 2012-09-20 | Discharge: 2012-09-20 | Disposition: A | Discharge status: Home / Self Care | Payer: Medicaid Other | Attending: Emergency Medicine | Admitting: Emergency Medicine

## 2012-09-20 DIAGNOSIS — Z8639 Personal history of other endocrine, nutritional and metabolic disease: Secondary | ICD-10-CM | POA: Insufficient documentation

## 2012-09-20 DIAGNOSIS — G40309 Generalized idiopathic epilepsy and epileptic syndromes, not intractable, without status epilepticus: Secondary | ICD-10-CM | POA: Insufficient documentation

## 2012-09-20 DIAGNOSIS — E039 Hypothyroidism, unspecified: Secondary | ICD-10-CM | POA: Insufficient documentation

## 2012-09-20 DIAGNOSIS — Z79899 Other long term (current) drug therapy: Secondary | ICD-10-CM | POA: Insufficient documentation

## 2012-09-20 DIAGNOSIS — Z8669 Personal history of other diseases of the nervous system and sense organs: Secondary | ICD-10-CM | POA: Insufficient documentation

## 2012-09-20 DIAGNOSIS — K219 Gastro-esophageal reflux disease without esophagitis: Secondary | ICD-10-CM | POA: Insufficient documentation

## 2012-09-20 DIAGNOSIS — Z862 Personal history of diseases of the blood and blood-forming organs and certain disorders involving the immune mechanism: Secondary | ICD-10-CM | POA: Insufficient documentation

## 2012-09-20 DIAGNOSIS — L02419 Cutaneous abscess of limb, unspecified: Secondary | ICD-10-CM | POA: Insufficient documentation

## 2012-09-20 DIAGNOSIS — M810 Age-related osteoporosis without current pathological fracture: Secondary | ICD-10-CM | POA: Insufficient documentation

## 2012-09-20 DIAGNOSIS — R609 Edema, unspecified: Secondary | ICD-10-CM | POA: Insufficient documentation

## 2012-09-20 DIAGNOSIS — L0390 Cellulitis, unspecified: Secondary | ICD-10-CM

## 2012-09-20 DIAGNOSIS — Z8659 Personal history of other mental and behavioral disorders: Secondary | ICD-10-CM | POA: Insufficient documentation

## 2012-09-20 DIAGNOSIS — G40909 Epilepsy, unspecified, not intractable, without status epilepticus: Secondary | ICD-10-CM | POA: Insufficient documentation

## 2012-09-20 DIAGNOSIS — L03119 Cellulitis of unspecified part of limb: Secondary | ICD-10-CM | POA: Insufficient documentation

## 2012-09-20 MED ORDER — CEPHALEXIN 500 MG PO CAPS
500.0000 mg | ORAL_CAPSULE | Freq: Once | ORAL | Status: AC
  Administered 2012-09-20: 500 mg via ORAL
  Filled 2012-09-20: qty 1

## 2012-09-20 MED ORDER — FLUCONAZOLE 150 MG PO TABS
150.0000 mg | ORAL_TABLET | Freq: Once | ORAL | Status: AC
  Administered 2012-09-20: 150 mg via ORAL
  Filled 2012-09-20: qty 1

## 2012-09-20 MED ORDER — CEPHALEXIN 500 MG PO CAPS: 500.0000 mg | ORAL_CAPSULE | Freq: Four times a day (QID) | ORAL | Status: DC

## 2012-09-20 NOTE — ED Notes (Signed)
Patient c/o scratch on right lower leg about a week ago.  Patient has erythema around scratch and mild oozing from wound.  No fevers noted.

## 2012-09-20 NOTE — ED Provider Notes (Signed)
Medical screening examination/treatment/procedure(s) were performed by non-physician practitioner and as supervising physician I was immediately available for consultation/collaboration.    Vida Roller, MD 09/20/12 [phone]

## 2012-09-20 NOTE — ED Provider Notes (Signed)
History     CSN: [id number]  Arrival date & time 09/20/12  1739   First MD Initiated Contact with Patient 09/20/12 1823      Chief Complaint  Patient presents with  . Wound Infection    (Consider location/radiation/quality/duration/timing/severity/associated sxs/prior treatment) HPI Comments: Patient with chronic lower extremity edema, history of MR presents with complaint of right lower extremity redness in the area of a previous scratch that she sustained one week ago. Caregiver has noted increasing redness over the past several days. She noted some drainage today. It is not improving with conservative treatment. The patient has not had fever, nausea, vomiting. Onset of symptoms gradual. Course is gradually worsening. Nothing makes symptoms better or worse. Patient has seen her primary care physician for lower extremity edema.  The history is provided by a caregiver and medical records.    Past Medical History  Diagnosis Date  . Cerebral palsy   . Lennox-Gastaut syndrome   . Profound mental retardation   . Osteoporosis   . Rickets   . Hypothyroidism   . Seizures   . Reflux     Past Surgical History  Procedure Laterality Date  . Peg tube placement      Family History  Problem Relation Age of Onset  . Diabetes      MGGM  . Heart disease      MGGF  . Kidney disease      Maternal Great Aunt   . Colon cancer Neg Hx   . Hypertension Mother   . Cancer Maternal Aunt     lung  . Heart disease Maternal Grandmother   . Stroke Maternal Grandfather   . Hypertension Paternal Grandmother     History  Substance Use Topics  . Smoking status: Never Smoker   . Smokeless tobacco: Never Used  . Alcohol Use: No    OB History   Grav Para Term Preterm Abortions TAB SAB Ect Mult Living                  Review of Systems  Constitutional: Negative for fever.  HENT: Negative for sore throat and rhinorrhea.   Eyes: Negative for redness.  Respiratory: Negative for cough.    Cardiovascular: Negative for chest pain.  Gastrointestinal: Negative for nausea, vomiting, abdominal pain and diarrhea.  Genitourinary: Negative for dysuria.  Musculoskeletal: Negative for myalgias.  Skin: Positive for color change and wound. Negative for rash.  Neurological: Negative for headaches.    Allergies  Carbamazepine  Home Medications   Current Outpatient Rx  Name  Route  Sig  Dispense  Refill  . alendronate (FOSAMAX) 70 MG tablet   Oral   Take 70 mg by mouth every 7 (seven) days. Takes on Saturdays Take with a full glass of water on an empty stomach.         . Calcium Carbonate-Vitamin D (CALCARB 600/D) 600-400 MG-UNIT per tablet   Oral   Take 1 tablet by mouth daily. 1 tab per tube 2 time a day         . divalproex (DEPAKOTE SPRINKLE) 125 MG capsule   Oral   Take 1,000 mg by mouth 2 (two) times daily. Take 8 capsules in the morning and 8 capsules in the evening         . folic acid (FOLVITE) 1 MG tablet   Oral   Take 1 mg by mouth every morning. 1 tab per tube daily         .  lactulose (CHRONULAC) 10 GM/15ML solution   Oral   Take 20 g by mouth 2 (two) times daily.          Marland Kitchen levETIRAcetam (KEPPRA) 100 MG/ML solution   Oral   Take 1,200 mg by mouth 2 (two) times daily. For seizures per Dr. Sharene Skeans         . levOCARNitine (CARNITOR) 1 GM/10ML solution   Oral   Take 500 mg by mouth 3 (three) times daily.          . pantoprazole sodium (PROTONIX) 40 mg/20 mL PACK   Per Tube   Place 40 mg into feeding tube daily. 12pm.         . Rufinamide (BANZEL) 40 MG/ML SUSP   Oral   Take 200 mg by mouth 2 (two) times daily.         Marland Kitchen topiramate (TOPAMAX SPRINKLE) 25 MG capsule   Oral   Take 150 mg by mouth 2 (two) times daily. 6 capsules by mouth twice a day for seizures per Dr. Sharene Skeans         . cephALEXin (KEFLEX) 500 MG capsule   Oral   Take 1 capsule (500 mg total) by mouth 4 (four) times daily.   28 capsule   0     BP 112/66   Pulse 75  Temp(Src) 97.5 F (36.4 C) (Oral)  Resp 22  SpO2 100%  Physical Exam  Nursing note and vitals reviewed. Constitutional: She appears well-developed and well-nourished.  HENT:  Head: Normocephalic and atraumatic.  Eyes: Conjunctivae are normal. Right eye exhibits no discharge. Left eye exhibits no discharge.  Neck: Normal range of motion. Neck supple.  Cardiovascular: Normal rate, regular rhythm and normal heart sounds.   Pulmonary/Chest: Effort normal and breath sounds normal.  Abdominal: Soft. There is no tenderness.  Neurological: She is alert.  Skin: Skin is warm and dry.  There is a linear abrasion to the right anterior lower extremity that is nearly healed. Around this is cellulitis and mild warmth. No drainage. No tenderness. 2+ bilateral nonpitting edema noted. Pedal pulses are normal.  Psychiatric: She has a normal mood and affect.    ED Course  Procedures (including critical care time)  Labs Reviewed - No data to display No results found.   1. Cellulitis    6:29 PM Patient seen and examined. Medications ordered.   Vital signs reviewed and are as follows: Filed Vitals:   09/20/12 1823  BP:   Pulse:   Temp: 97.5 F (36.4 C)  Resp:   BP 112/66  Pulse 75  Temp(Src) 97.5 F (36.4 C) (Oral)  Resp 22  SpO2 100%  Caregiver urged to return with worsening pain, worsening swelling, expanding area of redness or streaking up extremity, fever, or any other concerns. Urged to take complete course of antibiotics as prescribed. Caregiver verbalizes understanding and agrees with plan.  Urged PCP f/u in next several days for re-check.       MDM  Patient with a mild cellulitis of the right lower extremities around the previous wound. No lymphangitis. Patient has no systemic symptoms including fever or vomiting.         Renne Crigler, PA-C 09/20/12 1850

## 2012-09-27 ENCOUNTER — Ambulatory Visit: Payer: Medicaid Other | Attending: Family Medicine | Admitting: Physical Therapy

## 2012-09-27 DIAGNOSIS — R269 Unspecified abnormalities of gait and mobility: Secondary | ICD-10-CM | POA: Insufficient documentation

## 2012-09-27 DIAGNOSIS — IMO0001 Reserved for inherently not codable concepts without codable children: Secondary | ICD-10-CM | POA: Insufficient documentation

## 2012-10-01 ENCOUNTER — Other Ambulatory Visit: Payer: Self-pay | Admitting: *Deleted

## 2012-10-01 MED ORDER — ALENDRONATE SODIUM 70 MG PO TABS: 70.0000 mg | ORAL_TABLET | ORAL | Status: DC

## 2012-10-02 ENCOUNTER — Encounter: Payer: Medicaid Other | Admitting: Physical Therapy

## 2012-11-13 ENCOUNTER — Telehealth: Payer: Self-pay | Admitting: Family Medicine

## 2012-11-13 NOTE — Telephone Encounter (Signed)
Ms. Daphine Deutscher notified FMLA forms are ready to be picked up at front desk.. Copy made to be scanned in chart.  Ileana Ladd

## 2012-11-13 NOTE — Telephone Encounter (Signed)
Pt's mother dropped off paper work concerning FMLA to be filled out.

## 2012-11-13 NOTE — Telephone Encounter (Signed)
Completed today 

## 2012-12-13 ENCOUNTER — Telehealth: Payer: Self-pay

## 2012-12-13 DIAGNOSIS — Z79899 Other long term (current) drug therapy: Secondary | ICD-10-CM

## 2012-12-13 DIAGNOSIS — G40319 Generalized idiopathic epilepsy and epileptic syndromes, intractable, without status epilepticus: Secondary | ICD-10-CM

## 2012-12-13 NOTE — Telephone Encounter (Signed)
Michele [name] lvm stating that she would like lab orders sent to have labs drawn bc pt is getting little bruises popping up. She said she wants to make sure she is not clotting. I called mom and let her know that Michele [name] was out of the office until Monday. She said she was not going to bring her to have them drawn until Monday any ways. She uses [facility] on [address]. The fax # is [phone]. Please call Michele [name] when the orders have been sent so that she will know she can bring her to have them drawn. Milas Gain can be contacted at either [phone] or [phone].

## 2012-12-16 ENCOUNTER — Ambulatory Visit (INDEPENDENT_AMBULATORY_CARE_PROVIDER_SITE_OTHER): Payer: Medicaid Other | Admitting: Family Medicine

## 2012-12-16 ENCOUNTER — Encounter: Payer: Self-pay | Admitting: Family Medicine

## 2012-12-16 VITALS — BP 105/69 | HR 94 | Temp 98.3°F | Ht 59.0 in | Wt 194.0 lb

## 2012-12-16 DIAGNOSIS — M7989 Other specified soft tissue disorders: Secondary | ICD-10-CM

## 2012-12-16 MED ORDER — FUROSEMIDE 20 MG PO TABS: 20.0000 mg | ORAL_TABLET | Freq: Every day | ORAL | Status: DC

## 2012-12-16 NOTE — Patient Instructions (Addendum)
It was good to see you today.  - Lasix 20mg  daily for one week. Follow up in 7-10 days - Lab work at Dr. Darl Householder office - Keep legs elevated, and low salt intake - Compression hose on legs while up in chair  [employer]. Hairford, M.D. 12/16/2012 3:38 PM

## 2012-12-16 NOTE — Progress Notes (Signed)
Patient ID: Michele [name], female   DOB: 1986-03-26, 27 y.o.   MRN: [id number]  [facility] Amber M. Hairford, MD Phone: [phone]   Subjective: HPI: Patient is a 27 y.o. female presenting to clinic today for same day appointment for leg swelling.  Edema Patient complains of edema in both ankles and feet and both lower legs. The edema has been mild to moderate. She gets this swelling every 2-3 months. Onset of symptoms was several days ago, and patient's mother reports symptoms have gradually worsened since that time. The edema is present all day, but does improve with elevation of legs in her hospital bed. Mother states the problem has been intermittent for several years. The swelling has been aggravated by dependency of involved area and mom is concerned that the day program may be giving her extra salt. The swelling has been relieved by support stockings, elevation of involved area. Associated factors include: nothing. Cardiac risk factors include none.  Patient's swelling usually improves with Lasix. Last dose was 4 months ago. Mom is still using support hose but has to fold them down.   History Reviewed: Never smoker. Health Maintenance: UTD  ROS: Please see HPI above.  Objective: Office vital signs reviewed. BP 105/69  Pulse 94  Temp(Src) 98.3 F (36.8 C) (Oral)  Ht 4\' 11"  (1.499 m)  Wt 194 lb (87.998 kg)  BMI 39.16 kg/m2  Physical Examination:  General: Awake, alert. NAD. In wheelchair, mother at bedside HEENT: Atraumatic, normocephalic. MMM Pulm: CTAB, no wheezes Cardio: RRR, no murmurs appreciated Extremities: 1+ edema of bilateral feet and legs to the knees. Patient also has nickel to quarter sized bruises scattered on legs  Assessment: 27 y.o. female same day appointment  Plan: See Problem List and After Visit Summary

## 2012-12-16 NOTE — Telephone Encounter (Signed)
Please let her know that the lab orders have been faxed to [facility] on [address] [facility] [address] as requested. TG

## 2012-12-16 NOTE — Assessment & Plan Note (Signed)
Most likely dependent edema. Will restart short dose of Lasix 20mg  x1 week. Follow up at that time. Patient has labs scheduled at Dr. Darl Householder office tomorrow so labs were not obtained today. Asked mom to send those results to [place]. Also given new Rx for support hose. If anything changes, she should let us know, otherwise we will see her next week.

## 2012-12-16 NOTE — Telephone Encounter (Signed)
I called mom and let her know

## 2012-12-18 LAB — CBC WITH DIFFERENTIAL/PLATELET
Basophils Relative: 0 % (ref 0–1)
HCT: 33.6 % — ABNORMAL LOW (ref 36.0–46.0)
Hemoglobin: 11.3 g/dL — ABNORMAL LOW (ref 12.0–15.0)
Lymphocytes Relative: 35 % (ref 12–46)
Lymphs Abs: 3.3 10*3/uL (ref 0.7–4.0)
Monocytes Relative: 8 % (ref 3–12)
Neutro Abs: 5.4 10*3/uL (ref 1.7–7.7)
Neutrophils Relative %: 57 % (ref 43–77)
RBC: 4.18 MIL/uL (ref 3.87–5.11)
WBC: 9.5 10*3/uL (ref 4.0–10.5)

## 2012-12-18 LAB — ALT: ALT: 9 U/L (ref 0–35)

## 2012-12-24 ENCOUNTER — Ambulatory Visit (INDEPENDENT_AMBULATORY_CARE_PROVIDER_SITE_OTHER): Payer: Medicaid Other | Admitting: Family

## 2012-12-24 ENCOUNTER — Encounter: Payer: Self-pay | Admitting: Family

## 2012-12-24 VITALS — BP 110/70 | HR 86 | Ht <= 58 in | Wt 192.0 lb

## 2012-12-24 DIAGNOSIS — R269 Unspecified abnormalities of gait and mobility: Secondary | ICD-10-CM

## 2012-12-24 DIAGNOSIS — G40319 Generalized idiopathic epilepsy and epileptic syndromes, intractable, without status epilepticus: Secondary | ICD-10-CM | POA: Insufficient documentation

## 2012-12-24 DIAGNOSIS — G808 Other cerebral palsy: Secondary | ICD-10-CM

## 2012-12-24 DIAGNOSIS — R569 Unspecified convulsions: Secondary | ICD-10-CM

## 2012-12-24 DIAGNOSIS — G809 Cerebral palsy, unspecified: Secondary | ICD-10-CM

## 2012-12-24 DIAGNOSIS — Z79899 Other long term (current) drug therapy: Secondary | ICD-10-CM

## 2012-12-24 DIAGNOSIS — F73 Profound intellectual disabilities: Secondary | ICD-10-CM

## 2012-12-24 MED ORDER — DIVALPROEX SODIUM 125 MG PO CPSP: ORAL_CAPSULE | ORAL | Status: DC

## 2012-12-24 MED ORDER — KEPPRA 100 MG/ML PO SOLN: ORAL | Status: DC

## 2012-12-24 MED ORDER — TOPAMAX SPRINKLE 25 MG PO CPSP: ORAL_CAPSULE | ORAL | Status: DC

## 2012-12-24 MED ORDER — CARNITOR 1 GM/10ML PO SOLN: ORAL | Status: DC

## 2012-12-24 NOTE — Progress Notes (Signed)
Patient: Michele [name] MRN: [id number] Sex: female DOB: October 10, 1985  Provider: Elveria Rising, NP Location of Care: [facility]  Note type: Routine return visit  History of Present Illness: Referral Source: Dr. Asher Muir History from: Mother Chief Complaint: Seizures  Michele [name] is a 27 y.o. 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  She has had seizures as a child that were associated with high fever. 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  Michele [name] is taking and tolerating Depakote, Keppra, Topamax and Banzel for her seizure disorder. Her mother tells me today that since was last seen, she has had one seizure, that occurred a few weeks ago. With the seizure she fell out of her chair and suffered an abrasion to her elbow.  Since she was last seen, Michele [name] has had ongoing problems with dependent edema. She has developed a large amount of edema of both legs and of her hands. Her mother says that she was seen recently by her PCP and was started on Lasix.   Her mother contacted me recently with concern about bruises on her legs. A CBC was performed that was normal. Michele [name] has mild, resolving scattered bruises on her thighs and lower legs.  Michele [name] is largely non-ambulatory. She can take a few steps with assistance but is unsteady and requires a gait belt and person assistance.  Review of Systems: 12 system review was remarkable for weight gain, swelling in legs, incontinence, easy bruising and seizure.  Past Medical History  Diagnosis Date  . Cerebral palsy   . Lennox-Gastaut syndrome   . Profound mental retardation   . Osteoporosis   . Rickets   . Hypothyroidism   . Seizures   . Reflux    Hospitalizations: no, Head Injury: no, Nervous System Infections: no, Immunizations up to date: yes Past Medical History Comments: Significant for Lennox-Gastaut encephalopathy with  intractable seizures of mixed type.  She has a history of obesity, hyperparathyroidism, acromegaly, osteoporosis and anemia.  She has a history of gastroesophageal reflux with percutaneous gastrostomy tube placement in 2005.   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  She had an EKG in January 2014 that was normal.   Surgical History Past Surgical History  Procedure Laterality Date  . Peg tube placement    . Gastrocutaneous fistual repair  2012    Family History family history includes Cancer in her maternal aunt; Diabetes in an unspecified family member; Heart disease in her maternal grandmother and unspecified family member; Hypertension in her mother and paternal grandmother; Kidney disease in an unspecified family member; and Stroke in her maternal grandfather.  There is no history of Colon cancer. Family History is negative migraines, seizures, cognitive impairment, blindness, deafness, birth defects, chromosomal disorder, autism.  Social History History   Social History  . Marital Status: Single    Spouse Name: N/A    Number of Children: 0  . Years of Education: N/A   Occupational History  . Disabled     Social History Main Topics  . Smoking status: Never Smoker   . Smokeless tobacco: Never Used  . Alcohol Use: No  . Drug Use: No  . Sexually Active: No   Other Topics Concern  .  None   Social History Narrative   0 caffeine drinks    Lives with mother who helps ADLs.   Graduated from [employer] in 2010. Now attends day program at [employer].    Living with mother and sister.    Current Outpatient Prescriptions on File Prior to Visit  Medication Sig Dispense Refill  . alendronate (FOSAMAX) 70 MG tablet Take 1 tablet (70 mg total) by mouth  every 7 (seven) days. Takes on Saturdays with a full glass of water on an empty stomach.  12 tablet  1  . Calcium Carbonate-Vitamin D (CALCARB 600/D) 600-400 MG-UNIT per tablet Take 1 tablet by mouth daily. 1 tab per tube 2 time a day      . cephALEXin (KEFLEX) 500 MG capsule Take 1 capsule (500 mg total) by mouth 4 (four) times daily.  28 capsule  0  . divalproex (DEPAKOTE SPRINKLE) 125 MG capsule Take 1,000 mg by mouth 2 (two) times daily. Take 8 capsules in the morning and 8 capsules in the evening      . folic acid (FOLVITE) 1 MG tablet Take 1 mg by mouth every morning. 1 tab per tube daily      . furosemide (LASIX) 20 MG tablet Take 1 tablet (20 mg total) by mouth daily.  10 tablet  0  . lactulose (CHRONULAC) 10 GM/15ML solution Take 20 g by mouth 2 (two) times daily.       Marland Kitchen levETIRAcetam (KEPPRA) 100 MG/ML solution Take 1,200 mg by mouth 2 (two) times daily. For seizures per Dr. Sharene Skeans      . levOCARNitine (CARNITOR) 1 GM/10ML solution Take 500 mg by mouth 3 (three) times daily.       . pantoprazole sodium (PROTONIX) 40 mg/20 mL PACK Place 40 mg into feeding tube daily. 12pm.      . Rufinamide (BANZEL) 40 MG/ML SUSP Take 200 mg by mouth 2 (two) times daily.      Marland Kitchen topiramate (TOPAMAX SPRINKLE) 25 MG capsule Take 150 mg by mouth 2 (two) times daily. 6 capsules by mouth twice a day for seizures per Dr. Sharene Skeans       No current facility-administered medications on file prior to visit.   The medication list was reviewed and reconciled. All changes or newly prescribed medications were explained.  A complete medication list was provided to the patient/caregiver.  Allergies  Allergen Reactions  . Carbamazepine Rash    Physical Exam BP 110/70  Pulse 86  Ht 4\' 8"  (1.422 m)  Wt 192 lb (87.091 kg)  BMI 43.07 kg/m2 General: well developed, well nourished female, seated in a wheelchair, in no evident distress Head: head  normocephalic and atraumatic.  Neck: supple with no carotid or  supraclavicular bruits. Respiratory: clear to auscultation Cardiovascular: regular rate and rhythm, no murmurs. She had a few scattered bruises on her legs. Her hands and lower legs are edematous. Skin: She has a surgical scar on her abdomen from her surgery in July. She has a scar on her forehead from injury received in September 2012 when she had a seizure and fell out of her wheelchair.  Neurologic Exam  Mental Status: Awake and fully alert. She smiles responsively and laughs spontaneously. She claps her hands repeatedly. She has significant mental retardation. She has no language.  Cranial Nerves: Fundoscopic exam reveals sharp disc margins.  Pupils equal, briskly reactive to light.  Extraocular movements appeared to be intact.   Visual fields appeared full. Turns to localize sounds.  Face, tongue, palate move normally and symmetrically.  Neck flexion and extension normal. Motor: She has mild quadriparesis. She has increased tone, particularly in her legs. She has fairly good strength in all 4 extremities.  Sensory: Withdrawal x 4.  Coordination: She had fairly good fine motor movements with her hands. She could not follow instructions for adequate coordination testing. She did do well with hand to mouth movements.  Gait and Station: She is wheelchair bound. Reflexes: Diminished to 1+ and symmetric.  Toes downgoing.   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 Michele [name] also has quadriparesis and is dependent upon others for activities of daily living. She will continue her medications without change and will return for follow up in 6 months or sooner if needed.

## 2012-12-24 NOTE — Patient Instructions (Signed)
Continue to give her Depakote Sprinkles, Topamax Sprinkles, Keppra and Carnitor as you have been giving.  Call me if she has more seizures.  Her recent blood tests were normal.  Plan to return for follow up in 6 months or sooner if needed.

## 2012-12-26 ENCOUNTER — Encounter: Payer: Self-pay | Admitting: Family

## 2012-12-31 ENCOUNTER — Ambulatory Visit (INDEPENDENT_AMBULATORY_CARE_PROVIDER_SITE_OTHER): Payer: Medicaid Other | Admitting: Family Medicine

## 2012-12-31 ENCOUNTER — Encounter: Payer: Self-pay | Admitting: Family Medicine

## 2012-12-31 VITALS — BP 107/61 | HR 91 | Temp 97.7°F | Wt 193.9 lb

## 2012-12-31 DIAGNOSIS — R569 Unspecified convulsions: Secondary | ICD-10-CM

## 2012-12-31 DIAGNOSIS — M7989 Other specified soft tissue disorders: Secondary | ICD-10-CM

## 2012-12-31 NOTE — Assessment & Plan Note (Signed)
Stable on Depakote and Keppra.  No recent changes in medications and no new seizures.  Continue to follow

## 2012-12-31 NOTE — Progress Notes (Signed)
Michele [name] is a 27 y.o. female who presents today for f/u for her leg edema and seizure disorder.  Seizure Disorder - managed by Dr. Sharene Skeans, compliant with medications, no recent changes, about 1 seizure per month (tonic clonic) that is well controlled.  Leg Edema - Most likely dependent edema w/o much effect from the lasix (10 day course 20 mg qd) that she was on previously.  No unilateral leg swelling or tenderness, SOB, generalized edema, fever.  Edema has been ongoing for about one yr now, off and on w/o changes from taking Lasix.     Past Medical History  Diagnosis Date  . Cerebral palsy   . Lennox-Gastaut syndrome   . Profound mental retardation   . Osteoporosis   . Rickets   . Hypothyroidism   . Seizures   . Reflux     History  Smoking status  . Never Smoker   Smokeless tobacco  . Never Used    Family History  Problem Relation Age of Onset  . Diabetes      MGGM  . Heart disease      MGGF  . Kidney disease      Maternal Great Aunt   . Colon cancer Neg Hx   . Hypertension Mother   . Cancer Maternal Aunt     lung  . Heart disease Maternal Grandmother     Died at 72  . Stroke Maternal Grandfather   . Hypertension Paternal Grandmother     Current Outpatient Prescriptions on File Prior to Visit  Medication Sig Dispense Refill  . alendronate (FOSAMAX) 70 MG tablet Take 1 tablet (70 mg total) by mouth every 7 (seven) days. Takes on Saturdays with a full glass of water on an empty stomach.  12 tablet  1  . Calcium Carbonate-Vitamin D (CALCARB 600/D) 600-400 MG-UNIT per tablet Take 1 tablet by mouth daily. 1 tab per tube 2 time a day      . CARNITOR 1 GM/10ML solution Give 5 ml 3 times per day  510 mL  5  . cephALEXin (KEFLEX) 500 MG capsule Take 1 capsule (500 mg total) by mouth 4 (four) times daily.  28 capsule  0  . divalproex (DEPAKOTE SPRINKLE) 125 MG capsule Sprinkle 8 capsules on food in the morning and 8 capsules on food in the evening  544 capsule  5  .  folic acid (FOLVITE) 1 MG tablet Take 1 mg by mouth every morning. 1 tab per tube daily      . furosemide (LASIX) 20 MG tablet Take 1 tablet (20 mg total) by mouth daily.  10 tablet  0  . KEPPRA 100 MG/ML solution Give 12 ml by mouth in the morning and 12 ml by mouth in the evening  930 mL  5  . lactulose (CHRONULAC) 10 GM/15ML solution Take 20 g by mouth 2 (two) times daily.       . pantoprazole sodium (PROTONIX) 40 mg/20 mL PACK Place 40 mg into feeding tube daily. 12pm.      . Rufinamide (BANZEL) 40 MG/ML SUSP Take 200 mg by mouth 2 (two) times daily.      . TOPAMAX SPRINKLE 25 MG capsule Sprinkle 6 capsules on food in the morning and 6 capsules on food in the evening  408 capsule  5   No current facility-administered medications on file prior to visit.    ROS: Per HPI.  All other systems reviewed and are negative.   Physical Exam Filed  Vitals:   12/31/12 0836  BP: 107/61  Pulse: 91  Temp: 97.7 F (36.5 C)    Physical Examination: General appearance - alert, well appearing, and in no distress Chest - clear to auscultation, no wheezes, rales or rhonchi, symmetric air entry Heart - normal rate and regular rhythm, no murmurs appreciated  Extremities - +1 nonpitting edema B/L Skin - normal coloration and turgor, no rashes, no suspicious skin lesions noted   Lab Results  Component Value Date   WBC 9.5 12/18/2012   HGB 11.3* 12/18/2012   HCT 33.6* 12/18/2012   MCV 80.4 12/18/2012   PLT 289 12/18/2012

## 2012-12-31 NOTE — Patient Instructions (Signed)
Edema  Edema is an abnormal build-up of fluids in tissues. Because this is partly dependent on gravity (water flows to the lowest place), it is more common in the legs and thighs (lower extremities). It is also common in the looser tissues, like around the eyes. Painless swelling of the feet and ankles is common and increases as a person ages. It may affect both legs and may include the calves or even thighs. When squeezed, the fluid may move out of the affected area and may leave a dent for a few moments.  CAUSES    Prolonged standing or sitting in one place for extended periods of time. Movement helps pump tissue fluid into the veins, and absence of movement prevents this, resulting in edema.   Varicose veins. The valves in the veins do not work as well as they should. This causes fluid to leak into the tissues.   Fluid and salt overload.   Injury, burn, or surgery to the leg, ankle, or foot, may damage veins and allow fluid to leak out.   Sunburn damages vessels. Leaky vessels allow fluid to go out into the sunburned tissues.   Allergies (from insect bites or stings, medications or chemicals) cause swelling by allowing vessels to become leaky.   Protein in the blood helps keep fluid in your vessels. Low protein, as in malnutrition, allows fluid to leak out.   Hormonal changes, including pregnancy and menstruation, cause fluid retention. This fluid may leak out of vessels and cause edema.   Medications that cause fluid retention. Examples are sex hormones, blood pressure medications, steroid treatment, or anti-depressants.   Some illnesses cause edema, especially heart failure, kidney disease, or liver disease.   Surgery that cuts veins or lymph nodes, such as surgery done for the heart or for breast cancer, may result in edema.  DIAGNOSIS   Your caregiver is usually easily able to determine what is causing your swelling (edema) by simply asking what is wrong (getting a history) and examining you (doing  a physical). Sometimes x-rays, EKG (electrocardiogram or heart tracing), and blood work may be done to evaluate for underlying medical illness.  TREATMENT   General treatment includes:   Leg elevation (or elevation of the affected body part).   Restriction of fluid intake.   Prevention of fluid overload.   Compression of the affected body part. Compression with elastic bandages or support stockings squeezes the tissues, preventing fluid from entering and forcing it back into the blood vessels.   Diuretics (also called water pills or fluid pills) pull fluid out of your body in the form of increased urination. These are effective in reducing the swelling, but can have side effects and must be used only under your caregiver's supervision. Diuretics are appropriate only for some types of edema.  The specific treatment can be directed at any underlying causes discovered. Heart, liver, or kidney disease should be treated appropriately.  HOME CARE INSTRUCTIONS    Elevate the legs (or affected body part) above the level of the heart, while lying down.   Avoid sitting or standing still for prolonged periods of time.   Avoid putting anything directly under the knees when lying down, and do not wear constricting clothing or garters on the upper legs.   Exercising the legs causes the fluid to work back into the veins and lymphatic channels. This may help the swelling go down.   The pressure applied by elastic bandages or support stockings can help reduce ankle swelling.     A low-salt diet may help reduce fluid retention and decrease the ankle swelling.   Take any medications exactly as prescribed.  SEEK MEDICAL CARE IF:   Your edema is not responding to recommended treatments.  SEEK IMMEDIATE MEDICAL CARE IF:    You develop shortness of breath or chest pain.   You cannot breathe when you lay down; or if, while lying down, you have to get up and go to the window to get your breath.   You are having increasing  swelling without relief from treatment.   You develop a fever over 102 F (38.9 C).   You develop pain or redness in the areas that are swollen.   Tell your caregiver right away if you have gained 3 lb/1.4 kg in 1 day or 5 lb/2.3 kg in a week.  MAKE SURE YOU:    Understand these instructions.   Will watch your condition.   Will get help right away if you are not doing well or get worse.  Document Released: 06/05/2005 Document Revised: 12/05/2011 Document Reviewed: 01/22/2008  [employer] Patient Information 2014 [employer].

## 2012-12-31 NOTE — Assessment & Plan Note (Signed)
Stable, lasix did not seem to help too much.  Most likely dependent edema, labs normal.  Recommended compression stockings during the day and elevation of legs PRN.  Otherwise see back PRN

## 2013-01-06 ENCOUNTER — Other Ambulatory Visit: Payer: Self-pay | Admitting: *Deleted

## 2013-01-06 MED ORDER — PANTOPRAZOLE SODIUM 40 MG PO PACK: 40.0000 mg | PACK | Freq: Every day | ORAL | Status: DC

## 2013-01-20 ENCOUNTER — Other Ambulatory Visit: Payer: Self-pay | Admitting: Family

## 2013-01-20 DIAGNOSIS — G40319 Generalized idiopathic epilepsy and epileptic syndromes, intractable, without status epilepticus: Secondary | ICD-10-CM

## 2013-01-24 ENCOUNTER — Telehealth: Payer: Self-pay | Admitting: Family Medicine

## 2013-01-24 NOTE — Telephone Encounter (Signed)
Mom is calling for a Rx for Lasix to go to [facility] on [address], she doesn't have any for the weekend.

## 2013-01-24 NOTE — Telephone Encounter (Signed)
Please advise - this is not on [facility] and looks as if it was last prescribed 12/16/12. Wyatt Haste, RN-BSN

## 2013-01-25 ENCOUNTER — Encounter (HOSPITAL_COMMUNITY): Payer: Self-pay | Admitting: Emergency Medicine

## 2013-01-25 ENCOUNTER — Emergency Department (HOSPITAL_COMMUNITY)
Admission: EM | Admit: 2013-01-25 | Discharge: 2013-01-25 | Disposition: A | Discharge status: Home / Self Care | Payer: Medicaid Other | Attending: Emergency Medicine | Admitting: Emergency Medicine

## 2013-01-25 DIAGNOSIS — K219 Gastro-esophageal reflux disease without esophagitis: Secondary | ICD-10-CM | POA: Insufficient documentation

## 2013-01-25 DIAGNOSIS — Z760 Encounter for issue of repeat prescription: Secondary | ICD-10-CM | POA: Insufficient documentation

## 2013-01-25 DIAGNOSIS — Z8659 Personal history of other mental and behavioral disorders: Secondary | ICD-10-CM | POA: Insufficient documentation

## 2013-01-25 DIAGNOSIS — Z79899 Other long term (current) drug therapy: Secondary | ICD-10-CM | POA: Insufficient documentation

## 2013-01-25 DIAGNOSIS — G40909 Epilepsy, unspecified, not intractable, without status epilepticus: Secondary | ICD-10-CM | POA: Insufficient documentation

## 2013-01-25 DIAGNOSIS — M810 Age-related osteoporosis without current pathological fracture: Secondary | ICD-10-CM | POA: Insufficient documentation

## 2013-01-25 DIAGNOSIS — Z862 Personal history of diseases of the blood and blood-forming organs and certain disorders involving the immune mechanism: Secondary | ICD-10-CM | POA: Insufficient documentation

## 2013-01-25 DIAGNOSIS — Z8639 Personal history of other endocrine, nutritional and metabolic disease: Secondary | ICD-10-CM | POA: Insufficient documentation

## 2013-01-25 DIAGNOSIS — Z8669 Personal history of other diseases of the nervous system and sense organs: Secondary | ICD-10-CM | POA: Insufficient documentation

## 2013-01-25 LAB — POCT I-STAT, CHEM 8
BUN: 14 mg/dL (ref 6–23)
Chloride: 112 mEq/L (ref 96–112)
Potassium: 3.8 mEq/L (ref 3.5–5.1)
Sodium: 140 mEq/L (ref 135–145)
TCO2: 19 mmol/L (ref 0–100)

## 2013-01-25 MED ORDER — FUROSEMIDE 20 MG PO TABS
20.0000 mg | ORAL_TABLET | Freq: Once | ORAL | Status: AC
  Administered 2013-01-25: 20 mg via ORAL
  Filled 2013-01-25: qty 1

## 2013-01-25 MED ORDER — FUROSEMIDE 20 MG PO TABS: 20.0000 mg | ORAL_TABLET | Freq: Every day | ORAL | Status: DC

## 2013-01-25 NOTE — ED Notes (Signed)
Pt is not taking furosemide, changed care providers and did not get a prescription.  Has edema in lower extremities, bilat and edema in hands.  Very cooperative and Mom is at bedside.

## 2013-01-25 NOTE — ED Provider Notes (Signed)
Medical screening examination/treatment/procedure(s) were performed by non-physician practitioner and as supervising physician I was immediately available for consultation/collaboration.   Shanna Cisco, MD 01/25/13 [phone]

## 2013-01-25 NOTE — ED Provider Notes (Signed)
CSN: [id number]     Arrival date & time 01/25/13  4401 History     First MD Initiated Contact with Patient 01/25/13 [phone]     Chief Complaint  Patient presents with  . Leg Swelling  . Foot Swelling   (Consider location/radiation/quality/duration/timing/severity/associated sxs/prior Treatment) HPI Patient presents to the emergency department with swelling in her lower extremities.  Patient has cerebral palsy and is not able to talk and is in a wheelchair.  Mother gives a history for the patient.  She states that she called her doctor to get refills of her Lasix, but they were not called in. the patient has not had any signs of shortness of breath, vomiting, or diarrhea.  Mother states that her daughter's not had any fevers. Past Medical History  Diagnosis Date  . Cerebral palsy   . Lennox-Gastaut syndrome   . Profound mental retardation   . Osteoporosis   . Rickets   . Hypothyroidism   . Seizures   . Reflux    Past Surgical History  Procedure Laterality Date  . Peg tube placement    . Gastrocutaneous fistual repair  2012   Family History  Problem Relation Age of Onset  . Diabetes      MGGM  . Heart disease      MGGF  . Kidney disease      Maternal Great Aunt   . Colon cancer Neg Hx   . Hypertension Mother   . Cancer Maternal Aunt     lung  . Heart disease Maternal Grandmother     Died at 81  . Stroke Maternal Grandfather   . Hypertension Paternal Grandmother    History  Substance Use Topics  . Smoking status: Never Smoker   . Smokeless tobacco: Never Used  . Alcohol Use: No   OB History   Grav Para Term Preterm Abortions TAB SAB Ect Mult Living                 Review of Systems Level V caveat applies due to patient's cerebral palsy Allergies  Carbamazepine  Home Medications   Current Outpatient Rx  Name  Route  Sig  Dispense  Refill  . alendronate (FOSAMAX) 70 MG tablet   Oral   Take 1 tablet (70 mg total) by mouth every 7 (seven) days. Takes on  Saturdays with a full glass of water on an empty stomach.   12 tablet   1   . Calcium Carbonate-Vitamin D (CALCARB 600/D) 600-400 MG-UNIT per tablet   Oral   Take 1 tablet by mouth daily. 1 tab per tube 2 time a day         . divalproex (DEPAKOTE SPRINKLE) 125 MG capsule   Tube   Give 1,000 mg by tube 2 (two) times daily. Give 8 capsules in the morning and 8 capsules in the evening with food         . folic acid (FOLVITE) 1 MG tablet   Oral   Take 1 mg by mouth every morning. 1 tab per tube daily         . KEPPRA 100 MG/ML solution      Give 12 ml by mouth in the morning and 12 ml by mouth in the evening   930 mL   5     Dispense as written.    Brand Medically Necessary   . lactulose (CHRONULAC) 10 GM/15ML solution   Oral   Take 20 g by mouth 2 (two)  times daily.          Marland Kitchen levOCARNitine (CARNITOR) 1 GM/10ML solution   Tube   Give 500 mg by tube 3 (three) times daily.         . pantoprazole sodium (PROTONIX) 40 mg/20 mL PACK   Per Tube   Place 20 mLs (40 mg total) into feeding tube daily. 12pm.   30 each   11   . Rufinamide (BANZEL) 40 MG/ML SUSP   Oral   Take 5 mLs by mouth 2 (two) times daily.         Marland Kitchen topiramate (TOPAMAX SPRINKLE) 25 MG capsule   Oral   Take 150 mg by mouth 2 (two) times daily. Take 6 capsules in the morning and 6 capsules in the evening with food          BP 101/65  Pulse 71  Temp(Src) 97.6 F (36.4 C)  Resp 16  SpO2 98% Physical Exam  Nursing note and vitals reviewed. Constitutional: She appears well-developed and well-nourished.  HENT:  Head: Normocephalic and atraumatic.  Mouth/Throat: Oropharynx is clear and moist.  Eyes: Pupils are equal, round, and reactive to light.  Cardiovascular: Normal rate, regular rhythm and normal heart sounds.  Exam reveals no gallop and no friction rub.   No murmur heard. Pulmonary/Chest: Effort normal and breath sounds normal. No respiratory distress.  Musculoskeletal:  Patient has  trace edema in both lower extremities and ankles  Neurological: She is alert.  Skin: Skin is warm and dry. No rash noted. No erythema.    ED Course   Procedures (including critical care time) Patient be referred back to her primary Dr. for further evaluation and refill of her medication.  Mother is advised to return here for any worsening in her condition.  Patient does have any signs of overt heart failure at this time. MDM  MDM Reviewed: vitals, nursing note and previous chart Interpretation: labs      Carlyle Dolly, PA-C 01/25/13 [phone]

## 2013-01-27 NOTE — Telephone Encounter (Signed)
Pt did got to ED for this since I was out of town and did not receive this message until today.  She will most likely need refills in the future, at which point I will gladly do this.  Twana First Paulina Fusi, DO of [facility] 01/27/2013, 2:47 PM

## 2013-01-28 ENCOUNTER — Other Ambulatory Visit: Payer: Self-pay | Admitting: Family

## 2013-01-28 DIAGNOSIS — G40319 Generalized idiopathic epilepsy and epileptic syndromes, intractable, without status epilepticus: Secondary | ICD-10-CM

## 2013-02-22 ENCOUNTER — Other Ambulatory Visit: Payer: Self-pay | Admitting: Family Medicine

## 2013-02-26 ENCOUNTER — Emergency Department (HOSPITAL_COMMUNITY): Payer: Medicaid Other

## 2013-02-26 ENCOUNTER — Other Ambulatory Visit: Payer: Self-pay | Admitting: Family Medicine

## 2013-02-26 ENCOUNTER — Encounter (HOSPITAL_COMMUNITY): Payer: Self-pay | Admitting: Emergency Medicine

## 2013-02-26 ENCOUNTER — Emergency Department (HOSPITAL_COMMUNITY)
Admission: EM | Admit: 2013-02-26 | Discharge: 2013-02-26 | Disposition: A | Discharge status: Home / Self Care | Payer: Medicaid Other | Attending: Emergency Medicine | Admitting: Emergency Medicine

## 2013-02-26 DIAGNOSIS — W19XXXA Unspecified fall, initial encounter: Secondary | ICD-10-CM | POA: Insufficient documentation

## 2013-02-26 DIAGNOSIS — Z8739 Personal history of other diseases of the musculoskeletal system and connective tissue: Secondary | ICD-10-CM | POA: Insufficient documentation

## 2013-02-26 DIAGNOSIS — M79671 Pain in right foot: Secondary | ICD-10-CM

## 2013-02-26 DIAGNOSIS — Z862 Personal history of diseases of the blood and blood-forming organs and certain disorders involving the immune mechanism: Secondary | ICD-10-CM | POA: Insufficient documentation

## 2013-02-26 DIAGNOSIS — Y929 Unspecified place or not applicable: Secondary | ICD-10-CM | POA: Insufficient documentation

## 2013-02-26 DIAGNOSIS — S8990XA Unspecified injury of unspecified lower leg, initial encounter: Secondary | ICD-10-CM | POA: Insufficient documentation

## 2013-02-26 DIAGNOSIS — E039 Hypothyroidism, unspecified: Secondary | ICD-10-CM | POA: Insufficient documentation

## 2013-02-26 DIAGNOSIS — F73 Profound intellectual disabilities: Secondary | ICD-10-CM | POA: Insufficient documentation

## 2013-02-26 DIAGNOSIS — Y939 Activity, unspecified: Secondary | ICD-10-CM | POA: Insufficient documentation

## 2013-02-26 DIAGNOSIS — Z79899 Other long term (current) drug therapy: Secondary | ICD-10-CM | POA: Insufficient documentation

## 2013-02-26 DIAGNOSIS — G809 Cerebral palsy, unspecified: Secondary | ICD-10-CM | POA: Insufficient documentation

## 2013-02-26 DIAGNOSIS — Z8669 Personal history of other diseases of the nervous system and sense organs: Secondary | ICD-10-CM | POA: Insufficient documentation

## 2013-02-26 DIAGNOSIS — K219 Gastro-esophageal reflux disease without esophagitis: Secondary | ICD-10-CM | POA: Insufficient documentation

## 2013-02-26 DIAGNOSIS — Z8639 Personal history of other endocrine, nutritional and metabolic disease: Secondary | ICD-10-CM | POA: Insufficient documentation

## 2013-02-26 MED ORDER — PANTOPRAZOLE SODIUM 40 MG PO PACK: 40.0000 mg | PACK | Freq: Every day | ORAL | Status: DC

## 2013-02-26 NOTE — ED Provider Notes (Signed)
CSN: [id number]     Arrival date & time 02/26/13  1835 History   First MD Initiated Contact with Patient 02/26/13 2101     Chief Complaint  Patient presents with  . Fall  . Foot Injury   level V caveat due 2 mental retardation (Consider location/radiation/quality/duration/timing/severity/associated sxs/prior Treatment) Patient is a 27 y.o. female presenting with fall and foot injury. The history is provided by the patient and a parent.  Fall This is a new problem.  Foot Injury  patient has cerebral palsy. She fell and mother states that she thinks she has pain in her right foot. No loss of conscious. Patient fell forward. Patient has not been able to walk is easily. Past Medical History  Diagnosis Date  . Cerebral palsy   . Lennox-Gastaut syndrome   . Profound mental retardation   . Osteoporosis   . Rickets   . Hypothyroidism   . Seizures   . Reflux    Past Surgical History  Procedure Laterality Date  . Peg tube placement    . Gastrocutaneous fistual repair  2012   Family History  Problem Relation Age of Onset  . Diabetes      MGGM  . Heart disease      MGGF  . Kidney disease      Maternal Great Aunt   . Colon cancer Neg Hx   . Hypertension Mother   . Cancer Maternal Aunt     lung  . Heart disease Maternal Grandmother     Died at 28  . Stroke Maternal Grandfather   . Hypertension Paternal Grandmother    History  Substance Use Topics  . Smoking status: Never Smoker   . Smokeless tobacco: Never Used  . Alcohol Use: No   OB History   Grav Para Term Preterm Abortions TAB SAB Ect Mult Living                 Review of Systems  Unable to perform ROS   Allergies  Carbamazepine  Home Medications   Current Outpatient Rx  Name  Route  Sig  Dispense  Refill  . alendronate (FOSAMAX) 70 MG tablet   Oral   Take 1 tablet (70 mg total) by mouth every 7 (seven) days. Takes on Saturdays with a full glass of water on an empty stomach.   12 tablet   1   . Calcium  Carbonate-Vitamin D (CALCARB 600/D) 600-400 MG-UNIT per tablet   Oral   Take 1 tablet by mouth daily. 1 tab per tube 2 time a day         . divalproex (DEPAKOTE SPRINKLE) 125 MG capsule   Tube   Give 1,000 mg by tube 2 (two) times daily. Give 8 capsules in the morning and 8 capsules in the evening with food         . folic acid (FOLVITE) 1 MG tablet   Oral   Take 1 mg by mouth every morning. 1 tab per tube daily         . furosemide (LASIX) 20 MG tablet   Oral   Take 1 tablet (20 mg total) by mouth daily as needed.   30 tablet   2   . KEPPRA 100 MG/ML solution      Give 12 ml by mouth in the morning and 12 ml by mouth in the evening   930 mL   5     Dispense as written.    Brand Medically Necessary   .  lactulose (CHRONULAC) 10 GM/15ML solution   Oral   Take 20 g by mouth 2 (two) times daily.          Marland Kitchen levOCARNitine (CARNITOR) 1 GM/10ML solution   Tube   Give 500 mg by tube 3 (three) times daily.         . pantoprazole sodium (PROTONIX) 40 mg/20 mL PACK   Per Tube   Place 20 mLs (40 mg total) into feeding tube daily. 12pm.   30 each   11   . Rufinamide (BANZEL) 40 MG/ML SUSP   Oral   Take 5 mLs by mouth 2 (two) times daily.         Marland Kitchen topiramate (TOPAMAX SPRINKLE) 25 MG capsule   Oral   Take 150 mg by mouth 2 (two) times daily. Take 6 capsules in the morning and 6 capsules in the evening with food          BP 127/100  Pulse 97  Temp(Src) 97.3 F (36.3 C) (Axillary)  Resp 18  SpO2 99%  LMP 01/27/2013 Physical Exam  Constitutional: She appears well-nourished.  HENT:  Head: Normocephalic.  No tenderness to face. No epistaxis  Eyes: Pupils are equal, round, and reactive to light.  Neck: Normal range of motion. Neck supple.  Cardiovascular: Normal rate.   Pulmonary/Chest: Effort normal and breath sounds normal.  Abdominal: Soft. There is no tenderness.  Musculoskeletal:  Mild erythema to bilateral feet. Edema and bilateral lower extremities.  No clear tenderness over any extremities. Palpated along length of all extremities. Range of motion superior intact.  Neurological:  Patient is at her baseline.  Skin: Skin is warm.    ED Course  Procedures (including critical care time) Labs Review Labs Reviewed - No data to display Imaging Review Dg Tibia/fibula Right  02/26/2013   *RADIOLOGY REPORT*  Clinical Data: Right lower leg pain after fall.  History of cerebral palsy, Lennox-gastaut  Syndrome, osteoporosis, and Ricketts.  RIGHT TIBIA AND FIBULA - 2 VIEW  Comparison: None.  Findings: Mild bowing deformity of the tibia and fibula proximally. No evidence of acute fracture or subluxation.  No focal bone lesion or bone destruction.  Bone cortex and trabecular architecture appear intact.  IMPRESSION: Mild bowing deformities of the tibia and fibula.  No acute fractures demonstrated.   Original Report Authenticated By: Burman Nieves, M.D.   Dg Foot Complete Right  02/26/2013   *RADIOLOGY REPORT*  Clinical Data: Pain post trauma  RIGHT FOOT COMPLETE - 3+ VIEW  Comparison: None.  Findings: Frontal, oblique, and lateral views were obtained.  There is diffuse soft tissue swelling.  There is no fracture or dislocation.  Joint spaces appear intact.  No erosive change or bony destruction. There is an exostosis arising from the dorsal distal talus, a probable anatomic variant.  IMPRESSION: Diffuse soft tissue swelling.  No fracture or dislocation.   Original Report Authenticated By: Bretta Bang, M.D.    MDM   1. Fall, initial encounter   2. Foot pain, right    Patient with fall. No clear tend tenderness. Negative x-rays. Will discharge home. Mother has pain medicines   Juliet Rude. Rubin Payor, MD 02/27/13 [phone]

## 2013-02-26 NOTE — ED Notes (Signed)
Mom reports fall today and sts she thinks her daughter injured her r foot.  No LOC, no head injury.

## 2013-03-07 ENCOUNTER — Telehealth: Payer: Self-pay | Admitting: *Deleted

## 2013-03-07 NOTE — Telephone Encounter (Signed)
Received prior authorization request via fax from [facility]/[address] for Protonix 40 mg Pak.  Prior authorization completed in [employer] [place] [employer] and med approved for 03/06/13-03/06/14.  [facility] pharmacy informed.  Gaylene Brooks, RN

## 2013-03-21 ENCOUNTER — Other Ambulatory Visit: Payer: Self-pay | Admitting: Family

## 2013-03-31 ENCOUNTER — Telehealth: Payer: Self-pay | Admitting: Family Medicine

## 2013-03-31 MED ORDER — LACTULOSE 10 GM/15ML PO SOLN: 20.0000 g | Freq: Two times a day (BID) | ORAL | Status: DC

## 2013-03-31 NOTE — Telephone Encounter (Signed)
Completed.  Twana First Paulina Fusi, DO of [facility] 03/31/2013, 12:25 PM

## 2013-03-31 NOTE — Telephone Encounter (Signed)
Mother called for a refill of lactulose be sent to the [address] and [address]. JW

## 2013-04-03 ENCOUNTER — Ambulatory Visit (INDEPENDENT_AMBULATORY_CARE_PROVIDER_SITE_OTHER): Payer: Medicaid Other | Admitting: *Deleted

## 2013-04-03 ENCOUNTER — Encounter: Payer: Self-pay | Admitting: Family Medicine

## 2013-04-03 ENCOUNTER — Ambulatory Visit (INDEPENDENT_AMBULATORY_CARE_PROVIDER_SITE_OTHER): Payer: Medicaid Other | Admitting: Family Medicine

## 2013-04-03 VITALS — BP 92/60 | HR 85 | Temp 98.6°F | Wt 199.0 lb

## 2013-04-03 DIAGNOSIS — G809 Cerebral palsy, unspecified: Secondary | ICD-10-CM

## 2013-04-03 DIAGNOSIS — Z23 Encounter for immunization: Secondary | ICD-10-CM

## 2013-04-08 NOTE — Progress Notes (Signed)
  Subjective:    Patient ID: Michele [name], female    DOB: 02/24/1986, 27 y.o.   MRN: [id number]  HPI    Review of Systems     Objective:   Physical Exam        Assessment & Plan:  Visit changed to nursing visit.  She only needed an influenza vaccine.

## 2013-04-09 ENCOUNTER — Telehealth: Payer: Self-pay | Admitting: *Deleted

## 2013-04-09 NOTE — Telephone Encounter (Signed)
Mom would like a letter for work explaining Michele [name]'s illness.  She needs to show work that [name] can be sick and home without an office visit.

## 2013-04-15 NOTE — Telephone Encounter (Signed)
I called Mom. She said that things were worked out with her employer and that she did not need a letter at this time. TG

## 2013-05-29 ENCOUNTER — Other Ambulatory Visit: Payer: Self-pay | Admitting: Family Medicine

## 2013-06-09 ENCOUNTER — Telehealth: Payer: Self-pay | Admitting: Family Medicine

## 2013-06-09 NOTE — Telephone Encounter (Signed)
Patient needs a letter stating that she needs [employer]. Please fax letter to Domingo Madeira at [phone]. Gina's telephone is [phone]. Please have this done asap. Patient is very low.

## 2013-06-09 NOTE — Telephone Encounter (Signed)
Please advise. Michele [name]  

## 2013-06-10 NOTE — Telephone Encounter (Signed)
Letter faxed to [facility] [phone]. Michele [name], Michele [name]

## 2013-06-10 NOTE — Telephone Encounter (Signed)
Ready for fax.  Thanks, Twana First. Julus Kelley, DO of [facility] 06/10/2013, 9:00 AM

## 2013-06-18 ENCOUNTER — Encounter (HOSPITAL_COMMUNITY): Payer: Self-pay | Admitting: Emergency Medicine

## 2013-06-18 ENCOUNTER — Emergency Department (HOSPITAL_COMMUNITY): Payer: Medicaid Other

## 2013-06-18 ENCOUNTER — Emergency Department (HOSPITAL_COMMUNITY)
Admission: EM | Admit: 2013-06-18 | Discharge: 2013-06-18 | Disposition: A | Discharge status: Home / Self Care | Payer: Medicaid Other | Attending: Emergency Medicine | Admitting: Emergency Medicine

## 2013-06-18 DIAGNOSIS — G40309 Generalized idiopathic epilepsy and epileptic syndromes, not intractable, without status epilepticus: Secondary | ICD-10-CM | POA: Insufficient documentation

## 2013-06-18 DIAGNOSIS — M810 Age-related osteoporosis without current pathological fracture: Secondary | ICD-10-CM | POA: Insufficient documentation

## 2013-06-18 DIAGNOSIS — M79671 Pain in right foot: Secondary | ICD-10-CM | POA: Diagnosis present

## 2013-06-18 DIAGNOSIS — Z8669 Personal history of other diseases of the nervous system and sense organs: Secondary | ICD-10-CM | POA: Insufficient documentation

## 2013-06-18 DIAGNOSIS — W208XXA Other cause of strike by thrown, projected or falling object, initial encounter: Secondary | ICD-10-CM | POA: Insufficient documentation

## 2013-06-18 DIAGNOSIS — Y9210 Unspecified residential institution as the place of occurrence of the external cause: Secondary | ICD-10-CM | POA: Insufficient documentation

## 2013-06-18 DIAGNOSIS — Z79899 Other long term (current) drug therapy: Secondary | ICD-10-CM | POA: Insufficient documentation

## 2013-06-18 DIAGNOSIS — G40909 Epilepsy, unspecified, not intractable, without status epilepticus: Secondary | ICD-10-CM | POA: Insufficient documentation

## 2013-06-18 DIAGNOSIS — K219 Gastro-esophageal reflux disease without esophagitis: Secondary | ICD-10-CM | POA: Insufficient documentation

## 2013-06-18 DIAGNOSIS — S8990XA Unspecified injury of unspecified lower leg, initial encounter: Secondary | ICD-10-CM | POA: Insufficient documentation

## 2013-06-18 DIAGNOSIS — Y939 Activity, unspecified: Secondary | ICD-10-CM | POA: Insufficient documentation

## 2013-06-18 MED ORDER — DOXYCYCLINE HYCLATE 100 MG PO CAPS: 100.0000 mg | ORAL_CAPSULE | Freq: Two times a day (BID) | ORAL | Status: AC

## 2013-06-18 NOTE — ED Notes (Signed)
Patient transported to X-ray 

## 2013-06-18 NOTE — ED Provider Notes (Signed)
CSN: [id number]     Arrival date & time 06/18/13  9528 History   First MD Initiated Contact with Patient 06/18/13 1006     Chief Complaint  Patient presents with  . Foot Swelling  . Foot Pain   (Consider location/radiation/quality/duration/timing/severity/associated sxs/prior Treatment) Patient is a 27 y.o. female presenting with foot injury. The history is provided by a caregiver.  Foot Injury Location:  Foot Time since incident:  1 week Injury: yes   Mechanism of injury comment:  Railing of hospital bed fell onto foot Foot location:  R foot Pain details:    Quality:  Unable to specify   Severity:  Unable to specify   Onset quality:  Gradual   Duration:  1 week   Timing:  Constant   Progression:  Worsening Chronicity:  New Dislocation: no   Foreign body present:  No foreign bodies Prior injury to area:  No Relieved by:  Nothing Worsened by:  Nothing tried Ineffective treatments:  None tried Associated symptoms: no back pain, no fatigue, no fever and no neck pain   Risk factors: no concern for non-accidental trauma     Past Medical History  Diagnosis Date  . Cerebral palsy   . Lennox-Gastaut syndrome   . Profound mental retardation   . Osteoporosis   . Rickets   . Hypothyroidism   . Seizures   . Reflux    Past Surgical History  Procedure Laterality Date  . Peg tube placement    . Gastrocutaneous fistual repair  2012   Family History  Problem Relation Age of Onset  . Diabetes      MGGM  . Heart disease      MGGF  . Kidney disease      Maternal Great Aunt   . Colon cancer Neg Hx   . Hypertension Mother   . Cancer Maternal Aunt     lung  . Heart disease Maternal Grandmother     Died at 71  . Stroke Maternal Grandfather   . Hypertension Paternal Grandmother    History  Substance Use Topics  . Smoking status: Never Smoker   . Smokeless tobacco: Never Used  . Alcohol Use: No   OB History   Grav Para Term Preterm Abortions TAB SAB Ect Mult Living                 Review of Systems  Constitutional: Negative for fever and fatigue.  HENT: Negative for congestion and drooling.   Eyes: Negative for pain.  Respiratory: Negative for cough and shortness of breath.   Cardiovascular: Negative for chest pain.  Gastrointestinal: Negative for nausea, vomiting, abdominal pain and diarrhea.  Genitourinary: Negative for dysuria and hematuria.  Musculoskeletal: Negative for back pain, gait problem and neck pain.  Skin: Negative for color change.  Neurological: Negative for dizziness and headaches.  Hematological: Negative for adenopathy.  Psychiatric/Behavioral: Negative for behavioral problems.  All other systems reviewed and are negative.    Allergies  Carbamazepine  Home Medications   Current Outpatient Rx  Name  Route  Sig  Dispense  Refill  . alendronate (FOSAMAX) 70 MG tablet      TAKE 1 TABLET BY MOUTH EVERY 7 DAYS ON SATURDAYS WITH A FULL GLASS OF WATER ON AN EMTPY STOMACH   12 tablet   0   . Calcium Carbonate-Vitamin D (CALCARB 600/D) 600-400 MG-UNIT per tablet   Oral   Take 1 tablet by mouth daily. 1 tab per tube 2 time a  day         . CARNITOR 1 GM/10ML solution      TAKE ONE TEASPOONFUL(5ML) BY MOUTH THREE TIMES DAILY   510 mL   5     Dispense as written.   . divalproex (DEPAKOTE SPRINKLE) 125 MG capsule   Tube   Give 1,000 mg by tube 2 (two) times daily. Give 8 capsules in the morning and 8 capsules in the evening with food         . folic acid (FOLVITE) 1 MG tablet   Oral   Take 1 mg by mouth every morning. 1 tab per tube daily         . furosemide (LASIX) 20 MG tablet   Oral   Take 1 tablet (20 mg total) by mouth daily as needed.   30 tablet   2   . KEPPRA 100 MG/ML solution      Give 12 ml by mouth in the morning and 12 ml by mouth in the evening   930 mL   5     Dispense as written.    Brand Medically Necessary   . lactulose (CHRONULAC) 10 GM/15ML solution   Oral   Take 30 mLs (20 g  total) by mouth 2 (two) times daily.   240 mL   11   . pantoprazole sodium (PROTONIX) 40 mg/20 mL PACK   Per Tube   Place 20 mLs (40 mg total) into feeding tube daily. 12pm.   30 each   11   . Rufinamide (BANZEL) 40 MG/ML SUSP   Oral   Take 5 mLs by mouth 2 (two) times daily.         Marland Kitchen topiramate (TOPAMAX SPRINKLE) 25 MG capsule   Oral   Take 150 mg by mouth 2 (two) times daily. Take 6 capsules in the morning and 6 capsules in the evening with food          BP 109/65  Pulse 90  Temp(Src) 99.1 F (37.3 C) (Oral)  Resp 20  SpO2 99%  LMP 06/15/2013 Physical Exam  Nursing note and vitals reviewed. Constitutional: She is oriented to person, place, and time. She appears well-developed and well-nourished.  HENT:  Head: Normocephalic.  Mouth/Throat: Oropharynx is clear and moist. No oropharyngeal exudate.  Eyes: Conjunctivae and EOM are normal. Pupils are equal, round, and reactive to light.  Neck: Normal range of motion. Neck supple.  Cardiovascular: Normal rate, regular rhythm, normal heart sounds and intact distal pulses.  Exam reveals no gallop and no friction rub.   No murmur heard. Pulmonary/Chest: Effort normal and breath sounds normal. No respiratory distress. She has no wheezes.  Abdominal: Soft. Bowel sounds are normal. There is no tenderness. There is no rebound and no guarding.  Musculoskeletal: Normal range of motion. She exhibits no edema and no tenderness.  Mild redness and swelling to the dorsal surface of the right foot.  There is an approximately 2 cm elliptical excoriated lesion on the mid dorsal surface of the right foot.  Lower extremities appear symmetrical with mild pitting edema which is unchanged from baseline per the mother.  2+ distal pulses.  Neurological: She is alert and oriented to person, place, and time.  Skin: Skin is warm and dry.  Psychiatric: She has a normal mood and affect. Her behavior is normal.    ED Course  Procedures  (including critical care time) Labs Review Labs Reviewed - No data to display Imaging Review Dg Ankle Complete  Right  06/18/2013   CLINICAL DATA:  27 year old female status post blunt trauma with pain swelling and bruising along the medial malleolus. Initial encounter.  EXAM: RIGHT ANKLE - COMPLETE 3+ VIEW  COMPARISON:  Right tib-fib series 910 2014.  FINDINGS: Large body habitus and evidence of mild chronic bone dysplasia re- identified. Mortise joint alignment appears within normal limits. Talar dome intact. No joint effusion identified. Pes planus. Calcaneus appears intact. No acute fracture identified.  IMPRESSION: No acute fracture or dislocation identified about the right ankle.   Electronically Signed   By: Augusto Gamble M.D.   On: 06/18/2013 10:52   Dg Foot Complete Right  06/18/2013   CLINICAL DATA:  27 year old female status post blunt trauma with pain swelling and bruising. Initial encounter.  EXAM: RIGHT FOOT COMPLETE - 3+ VIEW  COMPARISON:  02/26/2013.  FINDINGS: Stable bone mineralization. Large body habitus. Calcaneus intact. Chronic appearing irregularity at the base of the 5th metatarsal suggesting remote fracture. No acute fracture or dislocation identified.  IMPRESSION: No acute fracture or dislocation identified about the right foot.   Electronically Signed   By: Augusto Gamble M.D.   On: 06/18/2013 10:50    EKG Interpretation   None       MDM   1. Right foot pain    10:19 AM 27 y.o. female with history of CP and MR presents with right foot injury which occurred approximately one week ago. The mother states that the rail of the hospital bed which they have at home fell down hitting the surface of the right foot the patient. She has had development of mild swelling and redness since that time. The mother states the patient has been afebrile and has appeared well otherwise. She is happy and clapping on exam. Will get screening imaging. Vital signs reviewed and unremarkable.  11:15  AM: I interpreted/reviewed the labs and/or imaging which were non-contributory. Will start doxy as dorsum of foot is mildly erythematous to prevent development of any cellulitis.  I have discussed the diagnosis/risks/treatment options with the caregiver and believe the pt to be eligible for discharge home to follow-up with pcp in 2 days for repeat eval. We also discussed returning to the ED immediately if new or worsening sx occur. We discussed the sx which are most concerning (e.g., fever, worsening pain, spreading redness, inc swelling) that necessitate immediate return. Any new prescriptions provided to the patient are listed below.  New Prescriptions   DOXYCYCLINE (VIBRAMYCIN) 100 MG CAPSULE    Take 1 capsule (100 mg total) by mouth 2 (two) times daily. One po bid x 7 days      Junius Argyle, MD 06/18/13 1116

## 2013-06-18 NOTE — ED Notes (Addendum)
Pt presents w/ R foot redness and swelling x 1 week.  Pt's mother sts Pt is in a hospital bed and the railing fell and hit her R foot.  Scabbed over 1in laceration noted to top of foot.  Swelling, warmth, and redness noted.  Hx of cerebral palsy.

## 2013-06-23 ENCOUNTER — Encounter: Payer: Self-pay | Admitting: Family

## 2013-06-25 ENCOUNTER — Ambulatory Visit (HOSPITAL_COMMUNITY)
Admission: RE | Admit: 2013-06-25 | Discharge: 2013-06-25 | Disposition: A | Discharge status: Home / Self Care | Payer: Medicaid Other | Source: Ambulatory Visit | Attending: Family Medicine | Admitting: Family Medicine

## 2013-06-25 ENCOUNTER — Ambulatory Visit: Payer: Medicaid Other | Admitting: Family Medicine

## 2013-06-25 ENCOUNTER — Ambulatory Visit (INDEPENDENT_AMBULATORY_CARE_PROVIDER_SITE_OTHER): Payer: Medicaid Other | Admitting: Family Medicine

## 2013-06-25 VITALS — BP 151/84 | HR 124 | Temp 99.0°F | Wt 200.6 lb

## 2013-06-25 DIAGNOSIS — M25571 Pain in right ankle and joints of right foot: Secondary | ICD-10-CM

## 2013-06-25 DIAGNOSIS — M79609 Pain in unspecified limb: Secondary | ICD-10-CM

## 2013-06-25 DIAGNOSIS — S9030XA Contusion of unspecified foot, initial encounter: Secondary | ICD-10-CM | POA: Insufficient documentation

## 2013-06-25 DIAGNOSIS — X58XXXA Exposure to other specified factors, initial encounter: Secondary | ICD-10-CM | POA: Insufficient documentation

## 2013-06-25 DIAGNOSIS — M79671 Pain in right foot: Secondary | ICD-10-CM

## 2013-06-25 DIAGNOSIS — M25579 Pain in unspecified ankle and joints of unspecified foot: Secondary | ICD-10-CM

## 2013-06-25 DIAGNOSIS — M2140 Flat foot [pes planus] (acquired), unspecified foot: Secondary | ICD-10-CM | POA: Insufficient documentation

## 2013-06-25 NOTE — Patient Instructions (Signed)
It was nice to see you.  We will obtain a CT scan of her foot to further evaluate.  I will call you with the results and the follow up plan.  Continue the antibiotic.

## 2013-06-25 NOTE — Assessment & Plan Note (Signed)
Secondary to injury in late December. Significant for swelling and erythema on exam.  Given pain with ambulation and physical exam findings there is concern for underlying fracture.   Will obtain CT of the foot today to further evaluate. Patient to continue doxycycline. We'll discuss findings and plan with mother later on today following CT results.

## 2013-06-25 NOTE — Progress Notes (Signed)
Subjective:     Patient ID: Michele [name], female   DOB: 05-Oct-1985, 28 y.o.   MRN: [id number]  HPI [age] or old female with history of cervical palsy and mental retardation presents for evaluation of right foot swelling/redness/pain..  Mother reports that patient injured her right foot in late December after a bed railing fell on her foot.  Following injury patient had some swelling and redness.  She also had a small cut.  The pain and swelling persisted and patient was taken to the emergency room on 12/31.  In the ED, x-rays of the foot and ankle were obtained and were negative.  Patient was started on doxycycline for presumed soft tissue infection (due to break in the skin and overlying erythema and swelling).  Patient presents today with continued swelling of the dorsum of her right foot.  She also continues to have a significant amount of redness.  Mom reports that she is limping and having difficulty walking secondary to pain.  No reported fever.  No further injury or trauma.   Review of Systems Per HPI    Objective:   Physical Exam Filed Vitals:   06/25/13 0835  BP: 151/84  Pulse: 124  Temp: 99 F (37.2 C)   Exam: General: Well-appearing obese African female sitting in wheelchair; no acute distress. Cardiovascular: RRR. No murmurs, rubs, or gallops. Respiratory: CTAB. No rales, rhonchi, or wheeze. Extremities:  Right foot and ankle: Significant swelling and erythema of the dorsum of the right foot.  Eschar noted from prior injury.  No underlying fluctuance appreciated.  No purulent drainage noted.   Neurovascularly intact distally.  Full ankle range of motion.       Assessment/Plan:  See Problem List

## 2013-06-26 ENCOUNTER — Ambulatory Visit: Payer: Medicaid Other | Admitting: Family

## 2013-06-26 ENCOUNTER — Telehealth: Payer: Self-pay | Admitting: Family Medicine

## 2013-06-26 ENCOUNTER — Other Ambulatory Visit: Payer: Self-pay | Admitting: Family Medicine

## 2013-06-26 NOTE — Telephone Encounter (Signed)
I spoke with mother on the phone and informed her of CT results.   I told her where to pick up the cam walker and to call SM for appointment tomorrow (per Dr. Jennette KettleNeal).

## 2013-06-27 ENCOUNTER — Telehealth: Payer: Self-pay | Admitting: *Deleted

## 2013-06-27 ENCOUNTER — Ambulatory Visit (INDEPENDENT_AMBULATORY_CARE_PROVIDER_SITE_OTHER): Payer: Medicaid Other | Admitting: Family Medicine

## 2013-06-27 ENCOUNTER — Encounter: Payer: Self-pay | Admitting: Family Medicine

## 2013-06-27 VITALS — BP 106/75 | Wt 200.0 lb

## 2013-06-27 DIAGNOSIS — M25579 Pain in unspecified ankle and joints of unspecified foot: Secondary | ICD-10-CM

## 2013-06-27 DIAGNOSIS — M25571 Pain in right ankle and joints of right foot: Secondary | ICD-10-CM

## 2013-06-27 DIAGNOSIS — S9030XA Contusion of unspecified foot, initial encounter: Secondary | ICD-10-CM

## 2013-06-27 DIAGNOSIS — S9031XA Contusion of right foot, initial encounter: Secondary | ICD-10-CM

## 2013-06-27 NOTE — Telephone Encounter (Signed)
Message copied by Tanna SavoyPROPOSITO, Alson Mcpheeters S on Fri Jun 27, 2013  9:54 AM ------      Message from: Tommie SamsOOK, JAYCE G      Created: Thu Jun 26, 2013  8:35 AM       Please call mother and have her call Sports Medicine to be seen on Friday of this week.  Additionally, I have fax over a cam walker for her to pick up at [facility].              Please inform her the results of the CT: she has a foot hematoma; no acute fracture.              Thanks            Jayce ------

## 2013-06-27 NOTE — Telephone Encounter (Signed)
Related message,pt voiced understanding.patient already picked up cam walker and is being seen today Friday 06/27/2013 at sports medicine.mother also states she has scheduled a follow up with Dr Paulina FusiHess in two weeks. Michele [name], Michele BouquetGiovanna [name]

## 2013-06-27 NOTE — Progress Notes (Signed)
CC: Right foot pain HPI: Patient is a 28 year old female with history of CP-MR who presents for evaluation of right foot pain that started about 3 weeks ago. She is nonverbal so her mother provides the history. Shortly after Christmas a bed rail fell on her foot. She had a lot of pain as well as significant swelling and bruising. She was seen in the ER several days later where she had x-rays that were negative. She then followed up with her PCP with continued foot pain and difficulty walking so a CT scan of her foot was ordered. This showed a possible accessory navicular versus remote a avulsion fracture of the navicular. She continues to have a lot of swelling of the foot so she is here for evaluation. She has been in the boot for a couple days now which her mom thinks has been helpful.  ROS: As above in the HPI. All other systems are stable or negative.   OBJECTIVE: APPEARANCE:  Patient in no acute distress.The patient appeared well nourished and normally developed. HEENT: No scleral icterus. Conjunctiva non-injected Resp: Non labored Skin: No rash MSK:  Foot exam: - Dependent edema bilaterally - Dorsum of right foot is prominently swollen and ecchymotic - Non-tender over the navicular, midfoot bones, and metatarsals - Normal range of motion of the foot and ankle passively without pain - No pain with twisting of the midfoot.  MSK US: Limited ultrasound of the right foot was performed with evaluation of the navicular. There was no evidence of a avulsion. Well organized hematoma visualized. No disruption of cortex or hypoechoic change off the cortex. On Doppler there is mildly increased signal at the bony cortex but no significant change.   ASSESSMENT: #1. Right midfoot contusion and hematoma #2. Right foot accessory navicular   PLAN: We have asked the patient to continue to use the boot at this time and wean out of the boot over the next one to 2 weeks as her comfort dictates. She was  given a note for her day program reflecting this. She may continue to ambulate as tolerated. She will followup with her PCP in about 2 weeks for recheck.

## 2013-07-02 ENCOUNTER — Other Ambulatory Visit: Payer: Self-pay | Admitting: Family

## 2013-07-07 ENCOUNTER — Ambulatory Visit (INDEPENDENT_AMBULATORY_CARE_PROVIDER_SITE_OTHER): Payer: Medicaid Other | Admitting: Family

## 2013-07-07 ENCOUNTER — Encounter: Payer: Self-pay | Admitting: Family

## 2013-07-07 VITALS — BP 114/76 | HR 88 | Ht <= 58 in | Wt 199.0 lb

## 2013-07-07 DIAGNOSIS — G809 Cerebral palsy, unspecified: Secondary | ICD-10-CM

## 2013-07-07 DIAGNOSIS — F73 Profound intellectual disabilities: Secondary | ICD-10-CM

## 2013-07-07 DIAGNOSIS — Z79899 Other long term (current) drug therapy: Secondary | ICD-10-CM

## 2013-07-07 DIAGNOSIS — G808 Other cerebral palsy: Secondary | ICD-10-CM

## 2013-07-07 DIAGNOSIS — R569 Unspecified convulsions: Secondary | ICD-10-CM

## 2013-07-07 DIAGNOSIS — G40319 Generalized idiopathic epilepsy and epileptic syndromes, intractable, without status epilepticus: Secondary | ICD-10-CM

## 2013-07-07 DIAGNOSIS — M7989 Other specified soft tissue disorders: Secondary | ICD-10-CM

## 2013-07-07 DIAGNOSIS — R269 Unspecified abnormalities of gait and mobility: Secondary | ICD-10-CM

## 2013-07-07 MED ORDER — DEPAKOTE SPRINKLES 125 MG PO CPSP: ORAL_CAPSULE | ORAL | Status: DC

## 2013-07-07 MED ORDER — BANZEL 40 MG/ML PO SUSP: ORAL | Status: DC

## 2013-07-07 MED ORDER — TOPAMAX SPRINKLE 25 MG PO CPSP: ORAL_CAPSULE | ORAL | Status: DC

## 2013-07-07 MED ORDER — KEPPRA 100 MG/ML PO SOLN: ORAL | Status: DC

## 2013-07-07 MED ORDER — LEVOCARNITINE 1 GM/10ML PO SOLN: ORAL | Status: DC

## 2013-07-07 NOTE — Progress Notes (Signed)
Patient: Michele [name] MRN: [id number] Sex: female DOB: 10-13-1985  Provider: Elveria Rising, NP Location of Care: [facility]  Note type: Routine return visit  History of Present Illness: Referral Source: Dr. Asher Muir History from: Mother Chief Complaint: Seizures  Michele [name] is a 28 y.o. 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 She has had seizures as a child that were associated with high fever. 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   Michele [name] is taking and tolerating Depakote, Keppra, Topamax and Banzel for her seizure disorder. Her mother tells me today that since was last seen, she has had no seizures and has tolerated her medications well.    Michele [name] has had ongoing problems with dependent edema. She has developed a large amount of edema of both legs and of her hands. She is followed by her PCP for this problem. Michele [name] also has scattered small bruises on her legs. She has had CBC's that are normal. Michele [name] is largely non-ambulatory. She can take a few steps with assistance but is unsteady and requires a gait belt and person assistance.   Michele [name]'s mother tells me today that Michele [name] injured her foot on a bed rail at the end of December. She said that she was seen in the ER and told that she had a contusion.   Review of Systems: 12 system review was remarkable for weight gain, swelling in legs, short of breath, incontinence, easy bruising and seizure  Past Medical History  Diagnosis Date  . Cerebral palsy   . Lennox-Gastaut syndrome   . Profound mental retardation   . Osteoporosis   . Rickets   . Hypothyroidism   . Seizures   . 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 She has a  history of obesity, hyperparathyroidism, acromegaly, osteoporosis and anemia. She has a history of gastroesophageal reflux with percutaneous gastrostomy tube placement in 2005.  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 Patient hit her head and lost teeth when she had a seizure in 2012.  She had an EKG in January 2014 that was normal  Surgical History Past Surgical History  Procedure Laterality Date  . Peg tube placement    . Gastrocutaneous fistual repair  2012    Family History family history includes Cancer in her maternal aunt; Diabetes in an other family member; Heart disease in her maternal grandmother and another family member; Hypertension in her mother and paternal grandmother; Kidney disease in an other family member; Stroke in her maternal grandfather. There is no history of Colon cancer. Family History is negative migraines, seizures, cognitive impairment, blindness, deafness, birth defects, chromosomal disorder, autism.  Social History History   Social History  . Marital Status: Single    Spouse Name: N/A    Number of Children: 0  . Years of Education: N/A   Occupational History  . Disabled     Social History Main Topics  . Smoking status: Never Smoker   . Smokeless tobacco: Never Used  . Alcohol Use: No  . Drug Use: No  . Sexual Activity: No  Other Topics Concern  . None   Social History Narrative   0 caffeine drinks    Lives with mother who helps ADLs.   Graduated from [employer] in 2010. Now attends day program at [employer].    Educational level: 12th grade  School Attending: [employer]. 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 She is currently attending [employer].  Current Outpatient Prescriptions on File Prior to Visit  Medication Sig Dispense Refill  . alendronate (FOSAMAX) 70 MG tablet Take 70 mg by mouth once a week. Take with a full glass of water on an empty stomach.      Marland Kitchen BANZEL 40 MG/ML SUSP TAKE 1 TEASPOONFUL BY MOUTH TWICE DAILY  340 mL  3  . Calcium Carbonate-Vitamin D (CALCARB 600/D) 600-400 MG-UNIT per tablet Take 1 tablet by mouth daily. 1 tab per tube 2 time a day      . divalproex (DEPAKOTE SPRINKLE) 125 MG capsule Give 1,000 mg by tube 2 (two) times daily. Give 8 capsules in the morning and 8 capsules in the evening with food      . folic acid (FOLVITE) 1 MG tablet Take 1 mg by mouth every morning. 1 tab per tube daily      . furosemide (LASIX) 20 MG tablet Take 1 tablet (20 mg total) by mouth daily as needed.  30 tablet  2  . KEPPRA 100 MG/ML solution Give 12 ml by mouth in the morning and 12 ml by mouth in the evening  930 mL  5  . lactulose (CHRONULAC) 10 GM/15ML solution Take 30 mLs (20 g total) by mouth 2 (two) times daily.  240 mL  11  . levOCARNitine (CARNITOR) 1 GM/10ML solution Take 1,000 mg by mouth daily.      . pantoprazole sodium (PROTONIX) 40 mg/20 mL PACK Place 20 mLs (40 mg total) into feeding tube daily. 12pm.  30 each  11  . topiramate (TOPAMAX SPRINKLE) 25 MG capsule Take 150 mg by mouth 2 (two) times daily. Take 6 capsules in the morning and 6 capsules in the evening with food       No current facility-administered medications on file prior to visit.    Allergies  Allergen Reactions  . Carbamazepine Rash    Physical Exam BP 114/76  Pulse 88  Ht 4\' 9"  (1.448 m)  Wt 199 lb (90.266 kg)  BMI 43.05 kg/m2  LMP 06/15/2013 General: well developed, well nourished female, seated in a wheelchair, in no evident distress  Head: head normocephalic and atraumatic.  Neck: supple with no carotid or supraclavicular bruits.  Respiratory: clear to auscultation   Cardiovascular: regular rate and rhythm, no murmurs. She had a few scattered bruises on her legs. Her hands and lower legs are edematous.  Skin: She has a surgical scar on her abdomen from her surgery in July. She has a scar on her forehead from injury received in September 2012 when she had a seizure and fell out of her wheelchair.   Neurologic Exam  Mental Status: Awake and fully alert. She smiles responsively and laughs spontaneously. She claps her hands repeatedly. She has significant mental retardation. She has no language.  Cranial Nerves: Fundoscopic exam reveals sharp disc margins. Pupils equal, briskly reactive to light. Extraocular movements appeared to be intact. Visual fields appeared full. Turns to localize sounds. Face, tongue, palate move normally and symmetrically. Neck  flexion and extension normal.  Motor: She has mild quadriparesis. She has increased tone, particularly in her legs. She has fairly good strength in all 4 extremities.  Sensory: Withdrawal x 4.  Coordination: She had fairly good fine motor movements with her hands. She could not follow instructions for adequate coordination testing. She did do well with hand to mouth movements.  Gait and Station: She is wheelchair bound.  Reflexes: Diminished to 1+ and symmetric. Toes downgoing.   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 [name] also has quadriparesis and is dependent upon others for activities of daily living. She will continue her medications without change and will return for follow up in 6 months or sooner if needed.

## 2013-07-09 ENCOUNTER — Encounter: Payer: Self-pay | Admitting: Family

## 2013-07-09 NOTE — Patient Instructions (Signed)
Continue Michele [name]'s medications without change. Let me know if she has any seizures.  Please plan to return for follow up in 6 months or sooner if needed.

## 2013-07-11 ENCOUNTER — Encounter: Payer: Self-pay | Admitting: Family Medicine

## 2013-07-11 ENCOUNTER — Ambulatory Visit (INDEPENDENT_AMBULATORY_CARE_PROVIDER_SITE_OTHER): Payer: Medicaid Other | Admitting: Family Medicine

## 2013-07-11 VITALS — BP 104/48 | HR 93 | Ht <= 58 in

## 2013-07-11 DIAGNOSIS — M7989 Other specified soft tissue disorders: Secondary | ICD-10-CM

## 2013-07-11 DIAGNOSIS — M79609 Pain in unspecified limb: Secondary | ICD-10-CM

## 2013-07-11 DIAGNOSIS — M79671 Pain in right foot: Secondary | ICD-10-CM

## 2013-07-11 MED ORDER — FUROSEMIDE 20 MG PO TABS: 20.0000 mg | ORAL_TABLET | Freq: Every day | ORAL | Status: DC | PRN

## 2013-07-11 NOTE — Progress Notes (Signed)
Michele [name] is a 28 y.o. female who presents today for R foot injury f/u.  Pt initially had a bed rail fall on her about one month ago causing significant edema of the R foot.  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  She was seen in sports medicine clinic shortly there after where she had an US and placed in a walking boot.    Past Medical History  Diagnosis Date  . Cerebral palsy   . Lennox-Gastaut syndrome   . Profound mental retardation   . Osteoporosis   . Rickets   . Hypothyroidism   . Seizures   . Reflux     History  Smoking status  . Never Smoker   Smokeless tobacco  . Never Used    Family History  Problem Relation Age of Onset  . Diabetes      MGGM  . Heart disease      MGGF  . Kidney disease      Maternal Great Aunt   . Colon cancer Neg Hx   . Hypertension Mother   . Cancer Maternal Aunt     lung  . Heart disease Maternal Grandmother     Died at 1964  . Stroke Maternal Grandfather     died at 7879  . Hypertension Paternal Grandmother     Current Outpatient Prescriptions on File Prior to Visit  Medication Sig Dispense Refill  . alendronate (FOSAMAX) 70 MG tablet Take 70 mg by mouth once a week. Take with a full glass of water on an empty stomach.      Marland Kitchen. BANZEL 40 MG/ML SUSP TAKE 1 TEASPOONFUL BY MOUTH TWICE DAILY  340 mL  5  . Calcium Carbonate-Vitamin D (CALCARB 600/D) 600-400 MG-UNIT per tablet Take 1 tablet by mouth daily. 1 tab per tube 2 time a day      . DEPAKOTE SPRINKLES 125 MG capsule Give 8 capsules in the morning and 8 capsules in the evening with food  544 capsule  5  . divalproex (DEPAKOTE SPRINKLE) 125 MG capsule Give 1,000 mg by tube 2 (two) times daily. Give 8 capsules in the morning and 8 capsules in the evening with food      . folic acid (FOLVITE) 1 MG tablet Take 1 mg by mouth every morning. 1 tab per tube daily      . KEPPRA 100 MG/ML  solution Give 12 ml by mouth in the morning and 12 ml by mouth in the evening  930 mL  5  . lactulose (CHRONULAC) 10 GM/15ML solution Take 30 mLs (20 g total) by mouth 2 (two) times daily.  240 mL  11  . levOCARNitine (CARNITOR) 1 GM/10ML solution Give 5ml 3 times per day per day  540 mL  5  . pantoprazole sodium (PROTONIX) 40 mg/20 mL PACK Place 20 mLs (40 mg total) into feeding tube daily. 12pm.  30 each  11  . TOPAMAX SPRINKLE 25 MG capsule Take 6 capsules in the morning and 6 capsules in the evening with food  408 capsule  5   No current facility-administered medications on file prior to visit.    ROS: Per HPI.  All other systems reviewed and are negative.   Physical Exam Filed Vitals:   07/11/13 1400  BP: 104/48  Pulse: 93    Physical Examination: General appearance - alert, well appearing,  and in no distress Chest - clear to auscultation, no wheezes, rales or rhonchi, symmetric air entry Heart - normal rate and regular rhythm Musc LE - Dependent edema bilaterally  R Foot -  Dorsum of right foot is prominently edematous with no gross erythema.  Non-tender over the navicular, midfoot bones, and metatarsals.  Normal ROM of foot/ankle w/o pain.

## 2013-07-11 NOTE — Patient Instructions (Signed)
[name] and Michele [name], it was nice seeing you today.  Please start to wean out of the walking boot to only using it when going somewhere over the next week.  If you are at home or around the car, please use regular shoes and then start using shoes on both feet at all times after that one week.  As well, please start taking lasix 40 mg (2 pills) in the morning for one week to see if we can get rid of some of the swelling in her foot.  We will see you back in 3-4 weeks.  Thanks, Dr. Paulina FusiHess

## 2013-07-11 NOTE — Assessment & Plan Note (Signed)
Agree with previous assessment that this is probably a R foot contusion at the navicular with incidentally find of accessory navicular.  Pt has been in walking boot for 2 weeks now and is non tender on exam.  Will slowly wean out of CAM over the next week and start with sneakers on both feet after that.  As well, will increase her lasix to 40 mg qd along with increased movement of the RLE to try and relieve some of the dependent edema.  F/U in 3-4 weeks.

## 2013-07-14 ENCOUNTER — Telehealth: Payer: Self-pay | Admitting: Family

## 2013-07-14 NOTE — Telephone Encounter (Signed)
Mom Michele [name] left a message saying that the pharmacy told her that they are unable to get Banzel for the patient. I called the pharmacy and they said that Banzel suspension is on [employer] production plannermanufacture backorder. TG

## 2013-07-15 NOTE — Telephone Encounter (Signed)
I talked with Mom and she was able to get some from [facility]. I put some Banzel 200mg  tablets at the front desk for Mom to pick up. She can crush them and give them to [place] in food. She can give her 1 tablet twice per day to replace the Banzel suspension 5ml twice per day. I told Mom to let me know if getting the suspension continues to be a problem. TG

## 2013-07-28 ENCOUNTER — Other Ambulatory Visit: Payer: Self-pay | Admitting: Family Medicine

## 2013-08-01 ENCOUNTER — Telehealth: Payer: Self-pay | Admitting: Family Medicine

## 2013-08-01 NOTE — Telephone Encounter (Signed)
Patient needs a letter stating that she is mentally retarded and has cerebral palsy and is unable to work.   Please call when completed.

## 2013-08-03 ENCOUNTER — Encounter: Payer: Self-pay | Admitting: Family Medicine

## 2013-08-04 ENCOUNTER — Encounter: Payer: Self-pay | Admitting: Family

## 2013-08-04 ENCOUNTER — Telehealth: Payer: Self-pay | Admitting: Family

## 2013-08-04 NOTE — Telephone Encounter (Signed)
Mom left a message saying that she needed a letter with Michele [name]'s diagnoses and that she was dependent upon her for care. The letter is written and ready for Mom to pick up. Please let Mom know. Thanks, Inetta Fermoina

## 2013-08-04 NOTE — Telephone Encounter (Signed)
Called Michele [name], and lm on machine letting her know the letter was ready for pick up and that she should call before picking it up to make sure that we are open due to pending severe weather.

## 2013-08-07 ENCOUNTER — Encounter: Payer: Self-pay | Admitting: Family Medicine

## 2013-08-13 ENCOUNTER — Ambulatory Visit (INDEPENDENT_AMBULATORY_CARE_PROVIDER_SITE_OTHER): Payer: Medicaid Other | Admitting: Family Medicine

## 2013-08-13 ENCOUNTER — Encounter: Payer: Self-pay | Admitting: Family Medicine

## 2013-08-13 VITALS — BP 93/56 | HR 86 | Temp 98.8°F

## 2013-08-13 DIAGNOSIS — L03119 Cellulitis of unspecified part of limb: Secondary | ICD-10-CM

## 2013-08-13 DIAGNOSIS — L02619 Cutaneous abscess of unspecified foot: Secondary | ICD-10-CM

## 2013-08-13 DIAGNOSIS — L03115 Cellulitis of right lower limb: Secondary | ICD-10-CM | POA: Insufficient documentation

## 2013-08-13 MED ORDER — CEPHALEXIN 500 MG PO CAPS: 500.0000 mg | ORAL_CAPSULE | Freq: Four times a day (QID) | ORAL | Status: DC

## 2013-08-13 MED ORDER — FLUCONAZOLE 150 MG PO TABS: 150.0000 mg | ORAL_TABLET | Freq: Once | ORAL | Status: DC

## 2013-08-13 NOTE — Patient Instructions (Signed)
It was nice seeing you today.  Please take the keflex at least three times per day and try to aim for four.  We will see you back in 6 months unless this does not improve.  Thanks, Dr. Paulina FusiHess

## 2013-08-13 NOTE — Assessment & Plan Note (Signed)
Most likely irritation reaction with CAM walker boot, however, pt mentally retarded and unable to communicate with pain.  She does have significant erythema to her ankle, edema (unchanged from normal and compared to opposite side), and no warmth or crying with palpation.  Will tx empirically with Keflex 500 mg QID x 7 days and f/u in 10-14 days if no to minimal improvement.

## 2013-08-13 NOTE — Progress Notes (Signed)
Michele [name] is a 28 y.o. female who presents today for RLE concern for cellulitis.  Pt's mom states the area has been red for the past 5-7 days now.  She has been in a CAM walker boot secondary to os tibiale externum (os navicular) for about 6 weeks.  She has been wearing proper protection with socks under the walker boot.  Denies any fever, decreased PO intake, acting abnormal, and does not seem to be in pain.  There is erythema, edema unchanged from previous, no warmth and no tenderness with palpation.    Past Medical History  Diagnosis Date  . Cerebral palsy   . Lennox-Gastaut syndrome   . Profound mental retardation   . Osteoporosis   . Rickets   . Hypothyroidism   . Seizures   . Reflux     History  Smoking status  . Never Smoker   Smokeless tobacco  . Never Used    Family History  Problem Relation Age of Onset  . Diabetes      MGGM  . Heart disease      MGGF  . Kidney disease      Maternal Great Aunt   . Colon cancer Neg Hx   . Hypertension Mother   . Cancer Maternal Aunt     lung  . Heart disease Maternal Grandmother     Died at 564  . Stroke Maternal Grandfather     died at 2879  . Hypertension Paternal Grandmother     Current Outpatient Prescriptions on File Prior to Visit  Medication Sig Dispense Refill  . alendronate (FOSAMAX) 70 MG tablet TAKE 1 TABLET BY MOUTH EVERY 7 DAYS ON SATURDAY WITH A FULL GLASS OF WATER ON AN EMTPY STOMACH  12 tablet  0  . BANZEL 40 MG/ML SUSP TAKE 1 TEASPOONFUL BY MOUTH TWICE DAILY  340 mL  5  . Calcium Carbonate-Vitamin D (CALCARB 600/D) 600-400 MG-UNIT per tablet Take 1 tablet by mouth daily. 1 tab per tube 2 time a day      . DEPAKOTE SPRINKLES 125 MG capsule Give 8 capsules in the morning and 8 capsules in the evening with food  544 capsule  5  . divalproex (DEPAKOTE SPRINKLE) 125 MG capsule Give 1,000 mg by tube 2 (two) times daily. Give 8 capsules in the morning and 8 capsules in the evening with food      . folic acid  (FOLVITE) 1 MG tablet Take 1 mg by mouth every morning. 1 tab per tube daily      . furosemide (LASIX) 20 MG tablet Take 1 tablet (20 mg total) by mouth daily as needed.  30 tablet  5  . KEPPRA 100 MG/ML solution Give 12 ml by mouth in the morning and 12 ml by mouth in the evening  930 mL  5  . lactulose (CHRONULAC) 10 GM/15ML solution Take 30 mLs (20 g total) by mouth 2 (two) times daily.  240 mL  11  . levOCARNitine (CARNITOR) 1 GM/10ML solution Give 5ml 3 times per day per day  540 mL  5  . pantoprazole sodium (PROTONIX) 40 mg/20 mL PACK Place 20 mLs (40 mg total) into feeding tube daily. 12pm.  30 each  11  . TOPAMAX SPRINKLE 25 MG capsule Take 6 capsules in the morning and 6 capsules in the evening with food  408 capsule  5   No current facility-administered medications on file prior to visit.    ROS: Per HPI.  All other systems reviewed and are negative.   Physical Exam Filed Vitals:   08/13/13 0903  BP: 93/56  Pulse: 86  Temp: 98.8 F (37.1 C)    Physical Examination:  LE: R foot - erythematous to the ankle joint, edematous + 1 (similar to L foot), no tenderness to palpation, and no warmth Neurovascular intact distally     Chemistry      Component Value Date/Time   NA 140 01/25/2013 0939   K 3.8 01/25/2013 0939   CL 112 01/25/2013 0939   CO2 21 07/04/2012 1403   BUN 14 01/25/2013 0939   CREATININE 0.90 01/25/2013 0939   CREATININE 0.82 07/04/2012 1403      Component Value Date/Time   CALCIUM 9.1 07/04/2012 1403   ALKPHOS 58 07/04/2012 1403   AST 22 07/04/2012 1403   ALT 9 12/18/2012 0750   BILITOT 0.2* 07/04/2012 1403

## 2013-09-26 ENCOUNTER — Ambulatory Visit (INDEPENDENT_AMBULATORY_CARE_PROVIDER_SITE_OTHER): Payer: Medicaid Other | Admitting: Family Medicine

## 2013-09-26 VITALS — BP 100/72 | HR 60 | Temp 97.8°F | Wt 192.5 lb

## 2013-09-26 DIAGNOSIS — R630 Anorexia: Secondary | ICD-10-CM

## 2013-09-26 DIAGNOSIS — R195 Other fecal abnormalities: Secondary | ICD-10-CM

## 2013-09-26 MED ORDER — ONDANSETRON HCL 4 MG/5ML PO SOLN
4.0000 mg | Freq: Three times a day (TID) | ORAL | Status: DC | PRN
Start: 2013-09-26 — End: 2014-06-28

## 2013-09-26 NOTE — Patient Instructions (Signed)
Stop lactulose and lasix over the weekend.  Use Zofran before breakfast and dinner to see if that helps her intake (this is an anti nausea medicine).  Check in with us on Monday if shes not doing better (I am full that afternoon but ask them to check with me about fitting you in the schedule).  If she worsens over the weekend, I would encourage an ED visit.  Check her temperature at least daily to make sure she doesn't have fevers.

## 2013-09-27 NOTE — Progress Notes (Signed)
Tana Conch, MD Phone: [phone]  Subjective:   Michele [name] is a 28 y.o. year old very pleasant female patient who presents with the following:  Loose Stools/decreased PO intake Patient with CP so history by mother. Mother describes patietn in normal state of health until Tuesday evening when she started to eat less. On wenesday, patient had 3 loose stools (no blood or mucus). She has not had any emesis. Patient is now eating 4-5 spoonfuls at each meal when normally eats well. DRinking 1/2 cup at each meal when normally drinks full cup. Patient is on lactulose and lasix and these have been continued. No OTC treatments have been tried. Goes to day school and has had to be out. Possible contacts with stomach bugs. Had a g-ube in 2013 which was removed and patient has fed well since that time though mother states was originally placed due o decreased intake. There is not a particularly foul odor to the stool. Diarrhea has fluctuated in frequency.   ROS- no fevers/emesis. No cough or congestion. Has baseline fecal and urinary incontinence but no change such as increased frequency. Has been able to take medications. Patient has still been interactive but not her normal playful self (like wantign to play patty cake but much less than normal). No obvious signs of pain.   Past Medical History-Patient was on antibiotics a month ago for cellulitis. History of epilepsy. 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 Mother denies history of recurrent UTI.   Medications- reviewed and updated Current Outpatient Prescriptions  Medication Sig Dispense Refill  . furosemide (LASIX) 20 MG tablet Take 1 tablet (20 mg total) by mouth daily as needed.  30 tablet  5  . alendronate (FOSAMAX) 70 MG tablet TAKE 1 TABLET BY MOUTH EVERY 7 DAYS ON SATURDAY WITH A FULL GLASS OF WATER ON AN EMTPY STOMACH  12 tablet  0  . BANZEL 40 MG/ML SUSP TAKE 1 TEASPOONFUL BY MOUTH TWICE DAILY  340  mL  5  . Calcium Carbonate-Vitamin D (CALCARB 600/D) 600-400 MG-UNIT per tablet Take 1 tablet by mouth daily. 1 tab per tube 2 time a day      . DEPAKOTE SPRINKLES 125 MG capsule Give 8 capsules in the morning and 8 capsules in the evening with food  544 capsule  5  . divalproex (DEPAKOTE SPRINKLE) 125 MG capsule Give 1,000 mg by tube 2 (two) times daily. Give 8 capsules in the morning and 8 capsules in the evening with food      . folic acid (FOLVITE) 1 MG tablet Take 1 mg by mouth every morning. 1 tab per tube daily      . KEPPRA 100 MG/ML solution Give 12 ml by mouth in the morning and 12 ml by mouth in the evening  930 mL  5  . lactulose (CHRONULAC) 10 GM/15ML solution Take 30 mLs (20 g total) by mouth 2 (two) times daily.  240 mL  11  . levOCARNitine (CARNITOR) 1 GM/10ML solution Give 5ml 3 times per day per day  540 mL  5  . ondansetron (ZOFRAN) 4 MG/5ML solution Take 5 mLs (4 mg total) by mouth every 8 (eight) hours as needed for nausea or vomiting.  50 mL  1  . pantoprazole sodium (PROTONIX) 40 mg/20 mL PACK Place 20 mLs (40 mg total) into feeding tube daily. 12pm.  30 each  11  . TOPAMAX SPRINKLE 25 MG capsule Take 6 capsules in the morning and 6  capsules in the evening with food  408 capsule  5   No current facility-administered medications for this visit.    Objective: BP 100/72  Pulse 60  Temp(Src) 97.8 F (36.6 C) (Axillary)  Wt 192 lb 8 oz (87.317 kg)  LMP 09/25/2013 Gen: sitting in wheelchair, interacting with mother and later with me wanting to play patty cake for short period, does also seem to be somewhat sleepy in chair HEENT: Mucous membranes are moist. CV: RRR no murmurs rubs or gallops Lungs: CTAB no crackles, wheeze, rhonchi Abdomen: soft/nontender (regarding facial expression). /nondistended/normal bowel sounds. No rebound or guarding.  Ext: trace edema, brisk capillary refill  Assessment/Plan:  Decreased appetite/loose stools (diarrhea) Possible viral  gastroenteritis. No other localizing symptoms other than diarrhea. No history of recurrent UTIs. TO help to avoid dehydration, will hold off on lasix and obvious stop lactulose as well. Have asked mother to follow up with us on Monday to check in unless drastically improved. Also will trial zofran before breakfast and dinner to see if nausea playing a role. Warning signs given for return to ED over the weekend. If diarrhea improves but appetite remains decreased, could consider cathing for urine as well as CBC and CMET and lipase.   Meds ordered this encounter  Medications  . ondansetron (ZOFRAN) 4 MG/5ML solution    Sig: Take 5 mLs (4 mg total) by mouth every 8 (eight) hours as needed for nausea or vomiting.    Dispense:  50 mL    Refill:  1

## 2013-11-01 ENCOUNTER — Other Ambulatory Visit: Payer: Self-pay | Admitting: Family Medicine

## 2013-11-03 ENCOUNTER — Encounter: Payer: Self-pay | Admitting: Family Medicine

## 2013-11-03 NOTE — Telephone Encounter (Signed)
Forms to be completed were placed in Dr Paulina FusiHess box.See previous message.Thank you.Michele GoryGiovanna S Ashdon [name]

## 2013-11-03 NOTE — Progress Notes (Unsigned)
FMLA papers dropped off to be filled out.  Please call mother when completed.

## 2013-11-04 ENCOUNTER — Telehealth: Payer: Self-pay | Admitting: *Deleted

## 2013-11-04 NOTE — Telephone Encounter (Signed)
Completes form placed upfront for pick.left message on mother's voicemail.Michele GoryGiovanna S [name] Michele [name]

## 2013-11-04 NOTE — Progress Notes (Signed)
Completed, given back for pick up.  Thanks, Twana FirstBryan R. Paulina FusiHess, DO [facility] 11/04/2013, 1:37 PM

## 2013-12-25 ENCOUNTER — Telehealth: Payer: Self-pay | Admitting: Family Medicine

## 2013-12-25 ENCOUNTER — Other Ambulatory Visit: Payer: Self-pay | Admitting: Family Medicine

## 2013-12-25 NOTE — Telephone Encounter (Signed)
Rx shows only 1 pill per day on the lasix but mother has been giving her 2 per day like Dr Paulina FusiHess told her at her last appt Now there are no more refills  Please advise

## 2013-12-26 MED ORDER — FUROSEMIDE 20 MG PO TABS: ORAL_TABLET | ORAL | Status: DC

## 2013-12-26 NOTE — Telephone Encounter (Signed)
Rx called back in with directions to take 1-2 tabs PRN.  Thanks, Twana FirstBryan R. Marinna Blane, DO [facility] 12/26/2013, 8:02 AM

## 2013-12-29 ENCOUNTER — Telehealth: Payer: Self-pay | Admitting: Family

## 2013-12-29 NOTE — Telephone Encounter (Signed)
Mom, Michele [name], sent me a note stating that she needs a letter with the dates that she brought [name] to appointments in 2013. I wrote the letter and called Mom to pick it up. TG

## 2014-01-05 ENCOUNTER — Ambulatory Visit (INDEPENDENT_AMBULATORY_CARE_PROVIDER_SITE_OTHER): Payer: Medicaid Other | Admitting: Family

## 2014-01-05 ENCOUNTER — Encounter: Payer: Self-pay | Admitting: Family

## 2014-01-05 VITALS — BP 120/70 | HR 84 | Ht <= 58 in | Wt 192.4 lb

## 2014-01-05 DIAGNOSIS — F73 Profound intellectual disabilities: Secondary | ICD-10-CM

## 2014-01-05 DIAGNOSIS — G40319 Generalized idiopathic epilepsy and epileptic syndromes, intractable, without status epilepticus: Secondary | ICD-10-CM

## 2014-01-05 DIAGNOSIS — R269 Unspecified abnormalities of gait and mobility: Secondary | ICD-10-CM

## 2014-01-05 DIAGNOSIS — G808 Other cerebral palsy: Secondary | ICD-10-CM

## 2014-01-05 DIAGNOSIS — Z79899 Other long term (current) drug therapy: Secondary | ICD-10-CM

## 2014-01-05 DIAGNOSIS — M7989 Other specified soft tissue disorders: Secondary | ICD-10-CM

## 2014-01-05 LAB — BASIC METABOLIC PANEL
BUN: 15 mg/dL (ref 6–23)
CHLORIDE: 104 meq/L (ref 96–112)
CO2: 28 mEq/L (ref 19–32)
Calcium: 8.7 mg/dL (ref 8.4–10.5)
Creat: 0.95 mg/dL (ref 0.50–1.10)
Glucose, Bld: 72 mg/dL (ref 70–99)
POTASSIUM: 4.1 meq/L (ref 3.5–5.3)
SODIUM: 142 meq/L (ref 135–145)

## 2014-01-05 LAB — CBC WITH DIFFERENTIAL/PLATELET
BASOS ABS: 0 10*3/uL (ref 0.0–0.1)
Basophils Relative: 0 % (ref 0–1)
Eosinophils Absolute: 0.1 10*3/uL (ref 0.0–0.7)
Eosinophils Relative: 1 % (ref 0–5)
HCT: 36.9 % (ref 36.0–46.0)
Hemoglobin: 12.6 g/dL (ref 12.0–15.0)
LYMPHS PCT: 51 % — AB (ref 12–46)
Lymphs Abs: 3.2 10*3/uL (ref 0.7–4.0)
MCH: 32.9 pg (ref 26.0–34.0)
MCHC: 34.1 g/dL (ref 30.0–36.0)
MCV: 96.3 fL (ref 78.0–100.0)
Monocytes Absolute: 0.6 10*3/uL (ref 0.1–1.0)
Monocytes Relative: 9 % (ref 3–12)
NEUTROS ABS: 2.5 10*3/uL (ref 1.7–7.7)
Neutrophils Relative %: 39 % — ABNORMAL LOW (ref 43–77)
PLATELETS: 186 10*3/uL (ref 150–400)
RBC: 3.83 MIL/uL — ABNORMAL LOW (ref 3.87–5.11)
RDW: 19.9 % — ABNORMAL HIGH (ref 11.5–15.5)
WBC: 6.3 10*3/uL (ref 4.0–10.5)

## 2014-01-05 LAB — ALT: ALT: 11 U/L (ref 0–35)

## 2014-01-05 MED ORDER — CARNITOR 1 GM/10ML PO SOLN: ORAL | Status: DC

## 2014-01-05 MED ORDER — KEPPRA 100 MG/ML PO SOLN: ORAL | Status: DC

## 2014-01-05 MED ORDER — TOPAMAX SPRINKLE 25 MG PO CPSP: ORAL_CAPSULE | ORAL | Status: DC

## 2014-01-05 MED ORDER — DEPAKOTE SPRINKLES 125 MG PO CPSP: ORAL_CAPSULE | ORAL | Status: DC

## 2014-01-05 MED ORDER — BANZEL 40 MG/ML PO SUSP: ORAL | Status: DC

## 2014-01-05 NOTE — Progress Notes (Signed)
Patient: Michele [name] MRN: [id number] Sex: female DOB: 1985-10-03  Provider: Elveria Rising, NP Location of Care: [facility]  Note type: Routine return visit  History of Present Illness: Referral Source: Dr. Asher Muir History from: her mother Chief Complaint: Seizures  Michele [name] is a 28 y.o. 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 She has had seizures as a child that were associated with high fever. [name] has quadriparesis and is dependent upon others for activities of daily living. She was last seen July 07, 2013. Her mother calls between visits to report seizures.   Michele [name] is taking and tolerating Depakote, Keppra, Topamax and Banzel for her seizure disorder. Her mother tells me today that since we last spoke, Michele [name] had a seizure about a month ago in the setting of a viral illness and fever.   Michele [name] has had ongoing problems with dependent edema. She has developed a large amount of edema of both legs and of her hands. She is followed by her PCP for this problem. Michele [name] also has scattered tiny bruises on her arms and legs. Her mother is very worried about the bruises and wonders if they could be caused from her seizure medication. She has had CBC's that are normal. Michele [name] is largely non-ambulatory. She can take a few steps with assistance but is unsteady and requires a gait belt and person assistance. She reportedly walks with a walker at times but still requires a good deal of assistance.   Michele [name] is wearing a cam walker boot on her left lower extremity today. Her mother say that it is from ongoing problems from where she injured her foot on a bed rail at the end of December.   Review of Systems: 12 system review was remarkable for swelling and bruising on both legs and right arm  Past Medical History  Diagnosis Date  . Cerebral palsy   . Lennox-Gastaut syndrome   . Profound mental retardation   . Osteoporosis    . Rickets   . Hypothyroidism   . Seizures   . Reflux    Hospitalizations: No., Head Injury: No., Nervous System Infections: No., Immunizations up to date: Yes.   Past Medical History Comments: the patient has been to the ED at [facility] for swelling of the legs. The patient had a CT scan and X-ray done.  Surgical History Past Surgical History  Procedure Laterality Date  . Peg tube placement    . Gastrocutaneous fistual repair  2012    Family History family history includes Cancer in her maternal aunt; Diabetes in an other family member; Heart disease in her maternal grandmother and another family member; Hypertension in her mother and paternal grandmother; Kidney disease in an other family member; Stroke in her maternal grandfather. There is no history of Colon cancer. Family History is otherwise negative for migraines, seizures, cognitive impairment, blindness, deafness, birth defects, chromosomal disorder, autism.  Social History History   Social History  . Marital Status: Single    Spouse Name: N/A    Number of Children: 0  . Years of Education: N/A   Occupational History  . Disabled     Social History Main Topics  . Smoking status: Never Smoker   . Smokeless tobacco: Never Used  . Alcohol Use: No  . Drug Use: No  . Sexual Activity: No   Other Topics Concern  . None   Social History Narrative   0 caffeine drinks    Lives  with mother who helps ADLs.   Graduated from [employer] in 2010. Now attends day program at [employer].    Educational level: 12th grade School Attending:[employer] Living with:  mother  Hobbies/Interest: Singing, playing in water and tearing up paper. School comments:  Michele [name] graduated from [employer] in 2010. She is currently attending a day program at [facility] [employer].  Physical Exam BP 120/70  Pulse 84  Ht 4\' 9"  (1.448 m)  Wt 192 lb 6.4 oz (87.272 kg)  BMI 41.62 kg/m2  LMP 12/08/2013 General: well developed, well nourished  female, seated in a wheelchair, in no evident distress  Head: head normocephalic and atraumatic.  Neck: supple with no carotid or supraclavicular bruits.  Respiratory: clear to auscultation  Cardiovascular: regular rate and rhythm, no murmurs. She had a few scattered tiny bruises on her arms and legs. Her hands, lower legs and feet are edematous.  Musculoskeletal: no obvious deformities of her limbs, but it is difficult to assess due to edema and adipose tissue covering landmarks on her legs and feet. Her knees and ankles are barely perceptible even when palpated. She is wearing a cam walker on her left lower extremity.  Skin: She has a surgical scar on her abdomen. She has a scar on her forehead. She has no upper front teeth. She has no areas of pressure or broken skin. She has no rashes or lesions.   Neurologic Exam  Mental Status: Awake and fully alert. She smiles responsively and laughs spontaneously. She claps her hands repeatedly. She has significant mental retardation. She has no language. She said Michele [name] a few times today. Cranial Nerves: Fundoscopic exam reveals sharp disc margins. Pupils equal, briskly reactive to light. Extraocular movements appeared to be intact. Visual fields appeared full. Turns to localize sounds. Face, tongue, palate move normally and symmetrically. Neck flexion and extension normal.  Motor: She has mild quadriparesis. She has increased tone, particularly in her legs. She has fairly good strength in all 4 extremities.  Sensory: Withdrawal x 4.  Coordination: She had fairly good fine motor movements with her hands. She could not follow instructions for adequate coordination testing. She did do well with hand clapping and some purposeful grabbing movements.  Gait and Station: She is wheelchair bound for the most part. Her mother and I got her up and she was able to bear weight briefly, with both of us supporting her. She could only take one or two steps and was very  hesitant. Reflexes: Diminished and symmetric. It was difficult to assess reflexes with her edema. I could not get plantar reflex response. Her feet are grossly edematous.  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 Michele [name] also has quadriparesis and is dependent upon others for activities of daily living. She has tiny scattered bruises on her arms and legs. Her mother is very concerned that these are related to her seizure medications. She has not had surveillance blood studies done in the past year of which I am aware, so I will order CBC, ALT and Basic Metabolic Panel. It is unlikely that the bruises are related to her medication but more likely accidental bruises that occur, but it is important to check the lab studies to be sure that her blood counts are adequate. I will call Mom when I receive the lab reports. She will continue her medications without change. She will follow up with her PCP  for her problems with edema. Lorian will return for follow up in 6 months or sooner if needed.

## 2014-01-05 NOTE — Patient Instructions (Signed)
Continue Michele [name]'s medication without change for now.  Let me know when she has seizures.  I have ordered blood tests to check her blood counts, liver and kidney function because of her seizure medications. I will call you when I get the results. I will send a copy to her primary care provider as well.  Please follow up with her primary care provider as you have been doing for her leg swelling and her left foot. Please plan to return for follow up in 6 months or sooner if needed.

## 2014-01-14 ENCOUNTER — Encounter (HOSPITAL_COMMUNITY): Payer: Self-pay | Admitting: Emergency Medicine

## 2014-01-14 ENCOUNTER — Emergency Department (HOSPITAL_COMMUNITY)
Admission: EM | Admit: 2014-01-14 | Discharge: 2014-01-14 | Disposition: A | Discharge status: Home / Self Care | Payer: Medicaid Other | Attending: Emergency Medicine | Admitting: Emergency Medicine

## 2014-01-14 DIAGNOSIS — Z79899 Other long term (current) drug therapy: Secondary | ICD-10-CM | POA: Diagnosis not present

## 2014-01-14 DIAGNOSIS — Y9389 Activity, other specified: Secondary | ICD-10-CM | POA: Diagnosis not present

## 2014-01-14 DIAGNOSIS — K219 Gastro-esophageal reflux disease without esophagitis: Secondary | ICD-10-CM | POA: Diagnosis not present

## 2014-01-14 DIAGNOSIS — W050XXA Fall from non-moving wheelchair, initial encounter: Secondary | ICD-10-CM | POA: Diagnosis not present

## 2014-01-14 DIAGNOSIS — Z862 Personal history of diseases of the blood and blood-forming organs and certain disorders involving the immune mechanism: Secondary | ICD-10-CM | POA: Insufficient documentation

## 2014-01-14 DIAGNOSIS — G40909 Epilepsy, unspecified, not intractable, without status epilepticus: Secondary | ICD-10-CM | POA: Insufficient documentation

## 2014-01-14 DIAGNOSIS — Z7983 Long term (current) use of bisphosphonates: Secondary | ICD-10-CM | POA: Diagnosis not present

## 2014-01-14 DIAGNOSIS — M810 Age-related osteoporosis without current pathological fracture: Secondary | ICD-10-CM | POA: Insufficient documentation

## 2014-01-14 DIAGNOSIS — Y92009 Unspecified place in unspecified non-institutional (private) residence as the place of occurrence of the external cause: Secondary | ICD-10-CM | POA: Diagnosis not present

## 2014-01-14 DIAGNOSIS — G40309 Generalized idiopathic epilepsy and epileptic syndromes, not intractable, without status epilepticus: Secondary | ICD-10-CM | POA: Diagnosis not present

## 2014-01-14 DIAGNOSIS — Z8639 Personal history of other endocrine, nutritional and metabolic disease: Secondary | ICD-10-CM | POA: Insufficient documentation

## 2014-01-14 DIAGNOSIS — Z23 Encounter for immunization: Secondary | ICD-10-CM | POA: Diagnosis not present

## 2014-01-14 DIAGNOSIS — S41109A Unspecified open wound of unspecified upper arm, initial encounter: Secondary | ICD-10-CM | POA: Diagnosis present

## 2014-01-14 DIAGNOSIS — Z8659 Personal history of other mental and behavioral disorders: Secondary | ICD-10-CM | POA: Diagnosis not present

## 2014-01-14 DIAGNOSIS — S41111A Laceration without foreign body of right upper arm, initial encounter: Secondary | ICD-10-CM

## 2014-01-14 MED ORDER — TETANUS-DIPHTH-ACELL PERTUSSIS 5-2.5-18.5 LF-MCG/0.5 IM SUSP
0.5000 mL | Freq: Once | INTRAMUSCULAR | Status: AC
  Administered 2014-01-14: 0.5 mL via INTRAMUSCULAR
  Filled 2014-01-14: qty 0.5

## 2014-01-14 NOTE — ED Provider Notes (Signed)
Medical screening examination/treatment/procedure(s) were conducted as a shared visit with non-physician practitioner(s) and myself.  I personally evaluated the patient during the encounter.   EKG Interpretation None       Flint MelterElliott L Arsalan Brisbin, MD 01/14/14 2355

## 2014-01-14 NOTE — ED Provider Notes (Signed)
LACERATION REPAIR Performed by: Lottie MusselKIRICHENKO, Georgi Tuel A Authorized by: Jaynie CrumbleKIRICHENKO, Lavonda Thal A Consent: Verbal consent obtained. Risks and benefits: risks, benefits and alternatives were discussed Consent given by: patient Patient identity confirmed: provided demographic data Prepped and Draped in normal sterile fashion Wound explored  Laceration Location: right antecubital fossa  Laceration Length: 6cm  No Foreign Bodies seen or palpated  Anesthesia: local infiltration  Local anesthetic: lidocaine 2% w epinephrine  Anesthetic total: 4 ml  Irrigation method: syringe Amount of cleaning: standard  Skin closure: vicryl rapid 4.0 sub q, prolene 4.0   Number of sutures: 3 subq, 8 superficial  Technique: simple interrupted  Patient tolerance: Patient tolerated the procedure well with no immediate complications.   Lottie Musselatyana A Carmelo Reidel, PA-C 01/14/14 2021

## 2014-01-14 NOTE — ED Notes (Signed)
Suture cart at bedside 

## 2014-01-14 NOTE — Discharge Instructions (Signed)

## 2014-01-14 NOTE — ED Notes (Signed)
Sutures applied by PA, pressure dressing in place. Bleeding controlled.

## 2014-01-14 NOTE — ED Notes (Signed)
PA at bedside for suture repair. 

## 2014-01-14 NOTE — ED Provider Notes (Signed)
CSN: [id number]     Arrival date & time 01/14/14  1841 History   First MD Initiated Contact with Patient 01/14/14 1849     Chief Complaint  Patient presents with  . Laceration     (Consider location/radiation/quality/duration/timing/severity/associated sxs/prior Treatment) Patient is a 28 y.o. female presenting with skin laceration. The history is provided by a relative.  Laceration  Patient accidentally slipped out of her wheelchair, injuring her right arm, above the elbow. This was her only injury.  Level 5 caveat- mental retardation  Past Medical History  Diagnosis Date  . Cerebral palsy   . Lennox-Gastaut syndrome   . Profound mental retardation   . Osteoporosis   . Rickets   . Hypothyroidism   . Seizures   . Reflux    Past Surgical History  Procedure Laterality Date  . Peg tube placement    . Gastrocutaneous fistual repair  2012   Family History  Problem Relation Age of Onset  . Diabetes      MGGM  . Heart disease      MGGF  . Kidney disease      Maternal Great Aunt   . Colon cancer Neg Hx   . Hypertension Mother   . Cancer Maternal Aunt     lung  . Heart disease Maternal Grandmother     Died at 15  . Stroke Maternal Grandfather     died at 40  . Hypertension Paternal Grandmother    History  Substance Use Topics  . Smoking status: Never Smoker   . Smokeless tobacco: Never Used  . Alcohol Use: No   OB History   Grav Para Term Preterm Abortions TAB SAB Ect Mult Living                 Review of Systems  Unable to perform ROS     Allergies  Carbamazepine  Home Medications   Prior to Admission medications   Medication Sig Start Date End Date Taking? Authorizing Provider  alendronate (FOSAMAX) 70 MG tablet Take 70 mg by mouth once a week. Take with a full glass of water on an empty stomach.   Yes Historical Provider, MD  BANZEL 40 MG/ML SUSP TAKE 1 TEASPOONFUL BY MOUTH TWICE DAILY 01/05/14  Yes Elveria Rising, NP  Calcium Carbonate-Vitamin  D (CALCARB 600/D) 600-400 MG-UNIT per tablet Take 1 tablet by mouth daily. 1 tab per tube 2 time a day   Yes Historical Provider, MD  CARNITOR 1 GM/10ML solution Give 5ml by mouth 3 times per day 01/05/14  Yes Elveria Rising, NP  DEPAKOTE SPRINKLES 125 MG capsule Give 8 capsules in the morning and 8 capsules in the evening with food 01/05/14  Yes Elveria Rising, NP  folic acid (FOLVITE) 1 MG tablet Take 1 mg by mouth every morning. 1 tab per tube daily   Yes Historical Provider, MD  furosemide (LASIX) 20 MG tablet Take 20-40 mg by mouth daily.   Yes Historical Provider, MD  KEPPRA 100 MG/ML solution Give 12 ml by mouth in the morning and 12 ml by mouth in the evening 01/05/14  Yes Elveria Rising, NP  lactulose (CHRONULAC) 10 GM/15ML solution Take 30 mLs (20 g total) by mouth 2 (two) times daily. 03/31/13  Yes Bryan R Hess, DO  ondansetron (ZOFRAN) 4 MG/5ML solution Take 5 mLs (4 mg total) by mouth every 8 (eight) hours as needed for nausea or vomiting. 09/26/13  Yes Shelva Majestic, MD  pantoprazole sodium (PROTONIX) 40 mg/20  mL PACK Place 20 mLs (40 mg total) into feeding tube daily. 12pm. 02/26/13  Yes Bryan R Hess, DO  TOPAMAX SPRINKLE 25 MG capsule Take 6 capsules in the morning and 6 capsules in the evening with food 01/05/14  Yes Elveria Risingina Goodpasture, NP   BP 142/97  Pulse 78  Temp(Src) 97.4 F (36.3 C) (Oral)  SpO2 96%  LMP 12/08/2013 Physical Exam  Nursing note and vitals reviewed. Constitutional: She appears well-developed and well-nourished. No distress.  HENT:  Head: Normocephalic and atraumatic.  Eyes: Conjunctivae and EOM are normal. Pupils are equal, round, and reactive to light.  Neck: Normal range of motion and phonation normal. Neck supple.  Cardiovascular: Normal rate.   Pulmonary/Chest: Effort normal.  Musculoskeletal: Normal range of motion.  Right arm- laceration in the antecubital area, above the flexion crease, gaping slightly. Neurovascular function intact distally in  the right hand  Neurological: She is alert.  Skin: Skin is warm and dry.  Psychiatric: Her behavior is normal.    ED Course  Procedures (including critical care time)  Laceration repair per Physicians assistant- see note  Medications  Tdap (BOOSTRIX) injection 0.5 mL (0.5 mLs Intramuscular Given 01/14/14 2019)    Patient Vitals for the past 24 hrs:  BP Temp Temp src Pulse SpO2  01/14/14 1915 142/97 mmHg - - 78 96 %  01/14/14 1847 96/51 mmHg 97.4 F (36.3 C) Oral 78 98 %    8:35 PM Reevaluation with update and discussion. After initial assessment and treatment, an updated evaluation reveals no further c/o; findings discussed with caregiver. Murle Otting L   Labs Review Labs Reviewed - No data to display  Imaging Review No results found.   EKG Interpretation None      MDM   Final diagnoses:  Laceration of right upper arm, initial encounter    Accidentally injured her right arm. There do not appear to any injuries to deep structures.  Nursing Notes Reviewed/ Care Coordinated Applicable Imaging Reviewed Interpretation of Laboratory Data incorporated into ED treatment  The patient appears reasonably screened and/or stabilized for discharge and I doubt any other medical condition or other [facility] requiring further screening, evaluation, or treatment in the ED at this time prior to discharge.  Plan: Home Medications- usual; Home Treatments- wound care; return here if the recommended treatment, does not improve the symptoms; Recommended follow up- PCP prn    Flint MelterElliott L Mariko Nowakowski, MD 01/14/14 2037

## 2014-01-14 NOTE — ED Notes (Signed)
Per ems, pt from home, slipped out of wheelchair and lacerated her right upper arm near the elbow. Laceration is roughly one inch and deep, fatty tissue noted. Pt hx of MR, edema, seizures.

## 2014-01-21 ENCOUNTER — Encounter: Payer: Self-pay | Admitting: *Deleted

## 2014-01-21 ENCOUNTER — Ambulatory Visit (INDEPENDENT_AMBULATORY_CARE_PROVIDER_SITE_OTHER): Payer: Medicaid Other | Admitting: *Deleted

## 2014-01-21 DIAGNOSIS — Z4802 Encounter for removal of sutures: Secondary | ICD-10-CM

## 2014-01-21 NOTE — Progress Notes (Signed)
   Pt in nurse clinic for suture removal due to laceration of right arm.  Nurse assessed wound; wound was not healed completely.  Eight sutures placed superficial and 8 sutures was intact.  Precepted with Dr. Randolm IdolFletke; one suture removed, area slightly open; steri strip placed and pt to return in two days for removal.  Clovis PuMartin, Tamika L, RN

## 2014-01-23 ENCOUNTER — Ambulatory Visit (INDEPENDENT_AMBULATORY_CARE_PROVIDER_SITE_OTHER): Payer: Medicaid Other | Admitting: Family Medicine

## 2014-01-23 ENCOUNTER — Encounter: Payer: Self-pay | Admitting: *Deleted

## 2014-01-23 DIAGNOSIS — Z4802 Encounter for removal of sutures: Secondary | ICD-10-CM

## 2014-01-23 NOTE — Progress Notes (Signed)
   Pt in nurse clinic for suture removal.  Sutures removed by Dr. McDiarmid.  Clovis PuMartin, Tamika L, RN

## 2014-01-26 NOTE — Progress Notes (Signed)
Patient ID: Michele [name], female   DOB: 29-Sep-1985, 28 y.o.   MRN: [id number] Gaping of epidermal layer with dermal layer visible before all but central suture removed.  Scant bleeding with lateral traction on laceration wound with no worse gaping. Steristrips applied. Patient to return next week for consideration of removal of last suture. Wound care instruction given to patient's caretakers.

## 2014-01-29 ENCOUNTER — Ambulatory Visit (INDEPENDENT_AMBULATORY_CARE_PROVIDER_SITE_OTHER): Payer: Medicaid Other | Admitting: *Deleted

## 2014-01-29 ENCOUNTER — Encounter: Payer: Self-pay | Admitting: *Deleted

## 2014-01-29 DIAGNOSIS — Z4802 Encounter for removal of sutures: Secondary | ICD-10-CM

## 2014-01-29 NOTE — Progress Notes (Signed)
   Pt in nurse clinic for suture removal of wound of right upper arm. Pt seen in clinic last week 7 sutures were in placed and six were removed by Dr. McDiarmid.  One suture removed without difficultly today. Wound assessed by Dr. Lum BabeEniola; Wound is not completed healed at this time.  pt appt made 02/05/14 with PCP for follow up.  Wound cleaned and dressing applied.  Pt's mom advised to clean and redress every two days per Dr. Lum BabeEniola and if wet; change dressing.  Mom stated understanding.  Mom advised if any increase in redness, swelling, or bleeding please call office for an appt.  Clovis PuMartin, Tonie Elsey L, RN

## 2014-02-05 ENCOUNTER — Ambulatory Visit (INDEPENDENT_AMBULATORY_CARE_PROVIDER_SITE_OTHER): Payer: Medicaid Other | Admitting: Family Medicine

## 2014-02-05 ENCOUNTER — Encounter: Payer: Self-pay | Admitting: Family Medicine

## 2014-02-05 VITALS — BP 108/58 | Temp 97.5°F

## 2014-02-05 DIAGNOSIS — IMO0002 Reserved for concepts with insufficient information to code with codable children: Secondary | ICD-10-CM

## 2014-02-05 DIAGNOSIS — Z5189 Encounter for other specified aftercare: Secondary | ICD-10-CM

## 2014-02-05 NOTE — Patient Instructions (Signed)
Please keep the wound open and clean.  If [name] starts to have drainage or fevers, please let us know.  Thanks, Dr. Paulina FusiHess

## 2014-02-05 NOTE — Progress Notes (Signed)
Michele PineRobin C Roca is a 28 y.o. female who presents today for wound check of the R arm.   R antecubital fossa wound - Repaired about 3 weeks ago with 7 simple nylon sutures.  They were removed about 10 days ago except for one to help with wound closure.  The last one was removed approximately one week ago.  Denies drainage, fever, erythema, edema around the area.  Has been wrapping with bandage and placing ABx ointment on this.    Past Medical History  Diagnosis Date  . Cerebral palsy   . Lennox-Gastaut syndrome   . Profound mental retardation   . Osteoporosis   . Rickets   . Hypothyroidism   . Seizures   . Reflux     History  Smoking status  . Never Smoker   Smokeless tobacco  . Never Used    Family History  Problem Relation Age of Onset  . Diabetes      MGGM  . Heart disease      MGGF  . Kidney disease      Maternal Great Aunt   . Colon cancer Neg Hx   . Hypertension Mother   . Cancer Maternal Aunt     lung  . Heart disease Maternal Grandmother     Died at 2164  . Stroke Maternal Grandfather     died at 6179  . Hypertension Paternal Grandmother     Current Outpatient Prescriptions on File Prior to Visit  Medication Sig Dispense Refill  . alendronate (FOSAMAX) 70 MG tablet Take 70 mg by mouth once a week. Take with a full glass of water on an empty stomach.      Marland Kitchen. BANZEL 40 MG/ML SUSP TAKE 1 TEASPOONFUL BY MOUTH TWICE DAILY  340 mL  5  . Calcium Carbonate-Vitamin D (CALCARB 600/D) 600-400 MG-UNIT per tablet Take 1 tablet by mouth daily. 1 tab per tube 2 time a day      . CARNITOR 1 GM/10ML solution Give 5ml by mouth 3 times per day  510 mL  5  . DEPAKOTE SPRINKLES 125 MG capsule Give 8 capsules in the morning and 8 capsules in the evening with food  544 capsule  5  . folic acid (FOLVITE) 1 MG tablet Take 1 mg by mouth every morning. 1 tab per tube daily      . furosemide (LASIX) 20 MG tablet Take 20-40 mg by mouth daily.      Marland Kitchen. KEPPRA 100 MG/ML solution Give 12 ml by mouth  in the morning and 12 ml by mouth in the evening  816 mL  5  . lactulose (CHRONULAC) 10 GM/15ML solution Take 30 mLs (20 g total) by mouth 2 (two) times daily.  240 mL  11  . ondansetron (ZOFRAN) 4 MG/5ML solution Take 5 mLs (4 mg total) by mouth every 8 (eight) hours as needed for nausea or vomiting.  50 mL  1  . pantoprazole sodium (PROTONIX) 40 mg/20 mL PACK Place 20 mLs (40 mg total) into feeding tube daily. 12pm.  30 each  11  . TOPAMAX SPRINKLE 25 MG capsule Take 6 capsules in the morning and 6 capsules in the evening with food  408 capsule  5   No current facility-administered medications on file prior to visit.    ROS: Per HPI.  All other systems reviewed and are negative.   Physical Exam Filed Vitals:   02/05/14 1451  BP: 108/58  Temp: 97.5 F (36.4 C)  Physical Examination: General appearance - alert, well appearing, and in no distress Skin - R 6-7 cm horizontal healing laceration with evidence of granulation tissue on the epidermal layer w/ no evidence of open subcutaneous or dermal tissue.

## 2014-02-05 NOTE — Assessment & Plan Note (Signed)
Completely covered by granulation tissue w/o erythema or purulent drainage. Appropriately healed, recommend leaving wound open w/o further wrapping or placing ointment.   If starts to develop fevers, chills, erythema, edema, or drainage, recommend recheck, otherwise f/u PRN.

## 2014-02-17 ENCOUNTER — Encounter: Payer: Self-pay | Admitting: *Deleted

## 2014-03-06 ENCOUNTER — Encounter: Payer: Self-pay | Admitting: Family Medicine

## 2014-03-06 NOTE — Progress Notes (Signed)
Form placed in PCP box for completion.Michele [name]  

## 2014-03-06 NOTE — Progress Notes (Signed)
Mother dropped off form to be filled out for medical equipment.  Please call her when completed.

## 2014-03-09 NOTE — Progress Notes (Signed)
Completed, ready for pick up.  Thanks [employer]. Paulina Fusi, DO of [facility] 03/09/2014, 9:52 AM

## 2014-03-10 NOTE — Progress Notes (Signed)
Left message to return call, please let her know the form is ready to be picked up.Zenola Dezarn, Rodena Medin

## 2014-03-11 NOTE — Progress Notes (Signed)
Spoke with patient's mother and informed her of below 

## 2014-03-14 ENCOUNTER — Emergency Department (HOSPITAL_COMMUNITY)
Admission: EM | Admit: 2014-03-14 | Discharge: 2014-03-14 | Disposition: A | Discharge status: Home / Self Care | Payer: Medicaid Other | Source: Home / Self Care | Attending: Family Medicine | Admitting: Family Medicine

## 2014-03-14 ENCOUNTER — Encounter (HOSPITAL_COMMUNITY): Payer: Self-pay | Admitting: Emergency Medicine

## 2014-03-14 DIAGNOSIS — B370 Candidal stomatitis: Secondary | ICD-10-CM

## 2014-03-14 DIAGNOSIS — B3783 Candidal cheilitis: Secondary | ICD-10-CM

## 2014-03-14 MED ORDER — CEPHALEXIN 250 MG/5ML PO SUSR: 250.0000 mg | Freq: Four times a day (QID) | ORAL | Status: AC

## 2014-03-14 MED ORDER — CLOTRIMAZOLE 1 % EX CREA: TOPICAL_CREAM | CUTANEOUS | Status: DC

## 2014-03-14 NOTE — ED Notes (Signed)
Patient here with mom Mom states had some dental work done Education officer, community used a mouth guard to keep her mouth open Patient now has sores on the corners of her mouth that are cracked and bleeding

## 2014-03-14 NOTE — Discharge Instructions (Signed)
Warm cloth to area of mouth before using medicine, then keep as dry as possible, see your doctor as needed.

## 2014-03-14 NOTE — ED Provider Notes (Signed)
CSN: [id number]     Arrival date & time 03/14/14  1018 History   First MD Initiated Contact with Patient 03/14/14 1043     Chief Complaint  Patient presents with  . Mouth Lesions   (Consider location/radiation/quality/duration/timing/severity/associated sxs/prior Treatment) Patient is a 28 y.o. female presenting with mouth sores. The history is provided by a parent.  Mouth Lesions Location:  Upper lip and lower lip Upper lip location:  L outer and R outer Lower lip location:  L outer and R outer Quality:  Crusty and painful Pain details:    Quality:  Sore   Severity:  Mild   Duration:  1 month (onset after going to dentist and using bite block.)   Progression:  Worsening Onset quality:  Gradual Severity:  Mild Chronicity:  New Ineffective treatments:  Topical medications   Past Medical History  Diagnosis Date  . Cerebral palsy   . Lennox-Gastaut syndrome   . Profound mental retardation   . Osteoporosis   . Rickets   . Hypothyroidism   . Seizures   . Reflux    Past Surgical History  Procedure Laterality Date  . Peg tube placement    . Gastrocutaneous fistual repair  2012   Family History  Problem Relation Age of Onset  . Diabetes      MGGM  . Heart disease      MGGF  . Kidney disease      Maternal Great Aunt   . Colon cancer Neg Hx   . Hypertension Mother   . Cancer Maternal Aunt     lung  . Heart disease Maternal Grandmother     Died at 57  . Stroke Maternal Grandfather     died at 27  . Hypertension Paternal Grandmother    History  Substance Use Topics  . Smoking status: Never Smoker   . Smokeless tobacco: Never Used  . Alcohol Use: No   OB History   Grav Para Term Preterm Abortions TAB SAB Ect Mult Living                 Review of Systems  Constitutional: Negative.   HENT: Positive for mouth sores. Negative for dental problem.     Allergies  Carbamazepine  Home Medications   Prior to Admission medications   Medication Sig Start Date  End Date Taking? Authorizing Provider  alendronate (FOSAMAX) 70 MG tablet Take 70 mg by mouth once a week. Take with a full glass of water on an empty stomach.    Historical Provider, MD  BANZEL 40 MG/ML SUSP TAKE 1 TEASPOONFUL BY MOUTH TWICE DAILY 01/05/14   Elveria Rising, NP  Calcium Carbonate-Vitamin D (CALCARB 600/D) 600-400 MG-UNIT per tablet Take 1 tablet by mouth daily. 1 tab per tube 2 time a day    Historical Provider, MD  CARNITOR 1 GM/10ML solution Give 5ml by mouth 3 times per day 01/05/14   Elveria Rising, NP  cephALEXin (KEFLEX) 250 MG/5ML suspension Take 5 mLs (250 mg total) by mouth 4 (four) times daily. 03/14/14 03/21/14  Linna Hoff, MD  clotrimazole (LOTRIMIN) 1 % cream Apply to affected area 2 times daily 03/14/14   Linna Hoff, MD  DEPAKOTE SPRINKLES 125 MG capsule Give 8 capsules in the morning and 8 capsules in the evening with food 01/05/14   Elveria Rising, NP  folic acid (FOLVITE) 1 MG tablet Take 1 mg by mouth every morning. 1 tab per tube daily    Historical Provider, MD  furosemide (LASIX) 20 MG tablet Take 20-40 mg by mouth daily.    Historical Provider, MD  KEPPRA 100 MG/ML solution Give 12 ml by mouth in the morning and 12 ml by mouth in the evening 01/05/14   Elveria Rising, NP  lactulose (CHRONULAC) 10 GM/15ML solution Take 30 mLs (20 g total) by mouth 2 (two) times daily. 03/31/13   Bryan R Hess, DO  ondansetron (ZOFRAN) 4 MG/5ML solution Take 5 mLs (4 mg total) by mouth every 8 (eight) hours as needed for nausea or vomiting. 09/26/13   Shelva Majestic, MD  pantoprazole sodium (PROTONIX) 40 mg/20 mL PACK Place 20 mLs (40 mg total) into feeding tube daily. 12pm. 02/26/13   Briscoe Deutscher, DO  TOPAMAX SPRINKLE 25 MG capsule Take 6 capsules in the morning and 6 capsules in the evening with food 01/05/14   Elveria Rising, NP   BP 126/79  Pulse 69  Temp(Src) 97.5 F (36.4 C) (Oral)  Resp 18  SpO2 95% Physical Exam  Nursing note and vitals  reviewed. Constitutional: She is oriented to person, place, and time. She appears well-developed and well-nourished.  HENT:  Head: Normocephalic.  Right Ear: External ear normal.  Left Ear: External ear normal.  Nose: Nose normal.  Cracking fissures cheilitis of bilat mouth corners, no erythema.  Neck: Normal range of motion. Neck supple.  Lymphadenopathy:    She has no cervical adenopathy.  Neurological: She is alert and oriented to person, place, and time.  Skin: Skin is warm and dry.    ED Course  Procedures (including critical care time) Labs Review Labs Reviewed - No data to display  Imaging Review No results found.   MDM   1. Angular cheilitis with candidiasis        Linna Hoff, MD 03/14/14 1119

## 2014-03-23 ENCOUNTER — Other Ambulatory Visit: Payer: Self-pay | Admitting: Family Medicine

## 2014-03-29 ENCOUNTER — Encounter (HOSPITAL_COMMUNITY): Payer: Self-pay | Admitting: Emergency Medicine

## 2014-03-29 ENCOUNTER — Emergency Department (HOSPITAL_COMMUNITY)
Admission: EM | Admit: 2014-03-29 | Discharge: 2014-03-29 | Disposition: A | Discharge status: Nursing Home (Includes ICF) | Payer: Medicaid Other | Source: Home / Self Care | Attending: Family Medicine | Admitting: Family Medicine

## 2014-03-29 ENCOUNTER — Emergency Department (HOSPITAL_COMMUNITY): Payer: Medicaid Other

## 2014-03-29 ENCOUNTER — Emergency Department (HOSPITAL_COMMUNITY)
Admission: EM | Admit: 2014-03-29 | Discharge: 2014-03-29 | Disposition: A | Discharge status: Home / Self Care | Payer: Medicaid Other | Attending: Emergency Medicine | Admitting: Emergency Medicine

## 2014-03-29 DIAGNOSIS — K219 Gastro-esophageal reflux disease without esophagitis: Secondary | ICD-10-CM | POA: Insufficient documentation

## 2014-03-29 DIAGNOSIS — G40812 Lennox-Gastaut syndrome, not intractable, without status epilepticus: Secondary | ICD-10-CM | POA: Insufficient documentation

## 2014-03-29 DIAGNOSIS — Z79899 Other long term (current) drug therapy: Secondary | ICD-10-CM | POA: Insufficient documentation

## 2014-03-29 DIAGNOSIS — G40311 Generalized idiopathic epilepsy and epileptic syndromes, intractable, with status epilepticus: Secondary | ICD-10-CM

## 2014-03-29 DIAGNOSIS — R0981 Nasal congestion: Secondary | ICD-10-CM | POA: Diagnosis present

## 2014-03-29 DIAGNOSIS — G809 Cerebral palsy, unspecified: Secondary | ICD-10-CM

## 2014-03-29 DIAGNOSIS — F73 Profound intellectual disabilities: Secondary | ICD-10-CM | POA: Diagnosis not present

## 2014-03-29 DIAGNOSIS — M810 Age-related osteoporosis without current pathological fracture: Secondary | ICD-10-CM | POA: Diagnosis not present

## 2014-03-29 DIAGNOSIS — R5383 Other fatigue: Secondary | ICD-10-CM

## 2014-03-29 DIAGNOSIS — T426X5A Adverse effect of other antiepileptic and sedative-hypnotic drugs, initial encounter: Secondary | ICD-10-CM | POA: Diagnosis not present

## 2014-03-29 DIAGNOSIS — I9589 Other hypotension: Secondary | ICD-10-CM

## 2014-03-29 DIAGNOSIS — Z7983 Long term (current) use of bisphosphonates: Secondary | ICD-10-CM | POA: Diagnosis not present

## 2014-03-29 DIAGNOSIS — E550 Rickets, active: Secondary | ICD-10-CM | POA: Insufficient documentation

## 2014-03-29 DIAGNOSIS — G40319 Generalized idiopathic epilepsy and epileptic syndromes, intractable, without status epilepticus: Secondary | ICD-10-CM

## 2014-03-29 LAB — CBC WITH DIFFERENTIAL/PLATELET
Basophils Absolute: 0 10*3/uL (ref 0.0–0.1)
Basophils Relative: 0 % (ref 0–1)
EOS ABS: 0 10*3/uL (ref 0.0–0.7)
EOS PCT: 0 % (ref 0–5)
HEMATOCRIT: 35.2 % — AB (ref 36.0–46.0)
HEMOGLOBIN: 11.6 g/dL — AB (ref 12.0–15.0)
LYMPHS ABS: 1.6 10*3/uL (ref 0.7–4.0)
LYMPHS PCT: 23 % (ref 12–46)
MCH: 35.7 pg — AB (ref 26.0–34.0)
MCHC: 33 g/dL (ref 30.0–36.0)
MCV: 108.3 fL — AB (ref 78.0–100.0)
MONO ABS: 0.7 10*3/uL (ref 0.1–1.0)
MONOS PCT: 10 % (ref 3–12)
Neutro Abs: 4.7 10*3/uL (ref 1.7–7.7)
Neutrophils Relative %: 67 % (ref 43–77)
Platelets: 122 10*3/uL — ABNORMAL LOW (ref 150–400)
RBC: 3.25 MIL/uL — AB (ref 3.87–5.11)
RDW: 20.6 % — ABNORMAL HIGH (ref 11.5–15.5)
WBC: 7.1 10*3/uL (ref 4.0–10.5)

## 2014-03-29 LAB — URINE MICROSCOPIC-ADD ON

## 2014-03-29 LAB — URINALYSIS, ROUTINE W REFLEX MICROSCOPIC
Glucose, UA: NEGATIVE mg/dL
HGB URINE DIPSTICK: NEGATIVE
Ketones, ur: 40 mg/dL — AB
Leukocytes, UA: NEGATIVE
Nitrite: NEGATIVE
PROTEIN: 30 mg/dL — AB
SPECIFIC GRAVITY, URINE: 1.037 — AB (ref 1.005–1.030)
Urobilinogen, UA: 2 mg/dL — ABNORMAL HIGH (ref 0.0–1.0)
pH: 7.5 (ref 5.0–8.0)

## 2014-03-29 LAB — COMPREHENSIVE METABOLIC PANEL
ALK PHOS: 68 U/L (ref 39–117)
ALT: 29 U/L (ref 0–35)
ANION GAP: 11 (ref 5–15)
AST: 58 U/L — ABNORMAL HIGH (ref 0–37)
Albumin: 3.2 g/dL — ABNORMAL LOW (ref 3.5–5.2)
BUN: 18 mg/dL (ref 6–23)
CALCIUM: 9.1 mg/dL (ref 8.4–10.5)
CO2: 28 meq/L (ref 19–32)
Chloride: 103 mEq/L (ref 96–112)
Creatinine, Ser: 0.91 mg/dL (ref 0.50–1.10)
GFR, EST NON AFRICAN AMERICAN: 86 mL/min — AB (ref >90)
Glucose, Bld: 63 mg/dL — ABNORMAL LOW (ref 70–99)
Potassium: 3.7 mEq/L (ref 3.7–5.3)
Sodium: 142 mEq/L (ref 137–147)
TOTAL PROTEIN: 7.1 g/dL (ref 6.0–8.3)
Total Bilirubin: 0.3 mg/dL (ref 0.3–1.2)

## 2014-03-29 LAB — VALPROIC ACID LEVEL: VALPROIC ACID LVL: 122.2 ug/mL — AB (ref 50.0–100.0)

## 2014-03-29 LAB — AMMONIA: Ammonia: 50 umol/L (ref 11–60)

## 2014-03-29 LAB — PRO B NATRIURETIC PEPTIDE: Pro B Natriuretic peptide (BNP): 147.2 pg/mL — ABNORMAL HIGH (ref 0–125)

## 2014-03-29 MED ORDER — DEXTROSE 50 % IV SOLN
50.0000 mL | Freq: Once | INTRAVENOUS | Status: AC
  Administered 2014-03-29: 50 mL via INTRAVENOUS
  Filled 2014-03-29: qty 50

## 2014-03-29 MED ORDER — SODIUM CHLORIDE 0.9 % IV SOLN
Freq: Once | INTRAVENOUS | Status: AC
  Administered 2014-03-29: 75 mL/h via INTRAVENOUS

## 2014-03-29 NOTE — ED Notes (Signed)
Michele [name] performed in and out cath with Michele [name]

## 2014-03-29 NOTE — ED Provider Notes (Signed)
Michele PineRobin C [name] is a 28 y.o. female who presents to Urgent Care today for fatigue.  Patient had a large grand mal seizure on Wednesday. She fell and hit her face. Since then she has not been eating and drinking well. She has a runny nose and is drooling more than usual. Her mother has not noticed a fever but notes that she has had pneumonia in the past and has been similar in appearance to this. No vomiting or diarrhea.   Past Medical History  Diagnosis Date  . Cerebral palsy   . Lennox-Gastaut syndrome   . Profound mental retardation   . Osteoporosis   . Rickets   . Hypothyroidism   . Seizures   . Reflux    History  Substance Use Topics  . Smoking status: Never Smoker   . Smokeless tobacco: Never Used  . Alcohol Use: No   ROS as above Medications: No current facility-administered medications for this encounter.   Current Outpatient Prescriptions  Medication Sig Dispense Refill  . alendronate (FOSAMAX) 70 MG tablet Take 70 mg by mouth once a week. Take with a full glass of water on an empty stomach.      Marland Kitchen. BANZEL 40 MG/ML SUSP TAKE 1 TEASPOONFUL BY MOUTH TWICE DAILY  340 mL  5  . Calcium Carbonate-Vitamin D (CALCARB 600/D) 600-400 MG-UNIT per tablet Take 1 tablet by mouth daily. 1 tab per tube 2 time a day      . CARNITOR 1 GM/10ML solution Give 5ml by mouth 3 times per day  510 mL  5  . clotrimazole (LOTRIMIN) 1 % cream Apply to affected area 2 times daily  30 g  1  . DEPAKOTE SPRINKLES 125 MG capsule Give 8 capsules in the morning and 8 capsules in the evening with food  544 capsule  5  . folic acid (FOLVITE) 1 MG tablet Take 1 mg by mouth every morning. 1 tab per tube daily      . furosemide (LASIX) 20 MG tablet Take 20-40 mg by mouth daily.      Marland Kitchen. KEPPRA 100 MG/ML solution Give 12 ml by mouth in the morning and 12 ml by mouth in the evening  816 mL  5  . lactulose (CHRONULAC) 10 GM/15ML solution Take 30 mLs (20 g total) by mouth 2 (two) times daily.  240 mL  11  . ondansetron  (ZOFRAN) 4 MG/5ML solution Take 5 mLs (4 mg total) by mouth every 8 (eight) hours as needed for nausea or vomiting.  50 mL  1  . PROTONIX 40 MG PACK PLACE 20 MLS INTO FEEDING TUBE DAILY 12PM  30 each  5  . TOPAMAX SPRINKLE 25 MG capsule Take 6 capsules in the morning and 6 capsules in the evening with food  408 capsule  5    Exam:  BP 88/52  Pulse 80  Temp(Src) 97.5 F (36.4 C) (Axillary)  Resp 16  SpO2 98% Gen: Well NAD HEENT: EOMI,  normal tympanic membranes bilaterally. Moist mucous membranes Lungs: Normal work of breathing. Difficult to hear lung sounds as patient does not take a deep breath.  Heart: RRR no MRG Abd: NABS, Soft. Nondistended, Nontender Exts: Brisk capillary refill, warm and well perfused.   No results found for this or any previous visit (from the past 24 hour(s)). No results found.  Assessment and Plan: 28 y.o. female with seizure and fall and change in baseline since the initial injury. I'm concerned for possible head injury versus  aspiration pneumonia. We are unable to perform a chest x-ray dislocation. I suspect the patient will require more care and attention were able to provide here. Transfer to the emergency department for evaluation and management. Transfer via private vehicle.  Discussed warning signs or symptoms. Please see discharge instructions. Patient expresses understanding.     Rodolph BongEvan S Devra Stare, MD 03/29/14 [phone]

## 2014-03-29 NOTE — ED Provider Notes (Signed)
CSN: [id number]     Arrival date & time 03/29/14  [id number] History   First MD Initiated Contact with Patient 03/29/14 1029     Chief Complaint  Patient presents with  . Nasal Congestion     (Consider location/radiation/quality/duration/timing/severity/associated sxs/prior Treatment) HPI Comments: Level V caveat. Patient is nonverbal her history of cerebral palsy and retardation seizure disorder. Mother states patient has been acting sick over the past 3 days with congestion not eating very well and not acting like herself. She is concerned about pneumonia. The patient had a grand mal seizure 5 days ago and struck her head and has not been acting right since. She has a history of seizures and takes seizure medication. No history of fever. No vomiting. There is some cough that is nonproductive. Patient is much more sleepy than usual and not as active.  The history is provided by a relative. The history is limited by the condition of the patient and a developmental delay.    Past Medical History  Diagnosis Date  . Cerebral palsy   . Lennox-Gastaut syndrome   . Profound mental retardation   . Osteoporosis   . Rickets   . Hypothyroidism   . Seizures   . Reflux    Past Surgical History  Procedure Laterality Date  . Peg tube placement    . Gastrocutaneous fistual repair  2012   Family History  Problem Relation Age of Onset  . Diabetes      MGGM  . Heart disease      MGGF  . Kidney disease      Maternal Great Aunt   . Colon cancer Neg Hx   . Hypertension Mother   . Cancer Maternal Aunt     lung  . Heart disease Maternal Grandmother     Died at 6564  . Stroke Maternal Grandfather     died at 1079  . Hypertension Paternal Grandmother    History  Substance Use Topics  . Smoking status: Never Smoker   . Smokeless tobacco: Never Used  . Alcohol Use: No   OB History   Grav Para Term Preterm Abortions TAB SAB Ect Mult Living                 Review of Systems  Unable to perform  ROS: Patient nonverbal      Allergies  Carbamazepine  Home Medications   Prior to Admission medications   Medication Sig Start Date End Date Taking? Authorizing Provider  alendronate (FOSAMAX) 70 MG tablet Take 70 mg by mouth once a week. Take on Saturdays with a full glass of water on an empty stomach.   Yes Historical Provider, MD  Calcium Carbonate-Vitamin D (CALCARB 600/D) 600-400 MG-UNIT per tablet Take 1 tablet by mouth daily. 1 tab per tube 2 time a day   Yes Historical Provider, MD  CARNITOR 1 GM/10ML solution Give 5ml by mouth 3 times per day 01/05/14  Yes Elveria Risingina Goodpasture, NP  clotrimazole (LOTRIMIN) 1 % cream Apply to affected area 2 times daily 03/14/14  Yes Linna HoffJames D Kindl, MD  DEPAKOTE SPRINKLES 125 MG capsule Give 8 capsules in the morning and 8 capsules in the evening with food 01/05/14  Yes Elveria Risingina Goodpasture, NP  folic acid (FOLVITE) 1 MG tablet Take 1 mg by mouth every morning. 1 tab per tube daily   Yes Historical Provider, MD  furosemide (LASIX) 20 MG tablet Take 20-40 mg by mouth daily.   Yes Historical Provider, MD  [facility]  100 MG/ML solution Give 12 ml by mouth in the morning and 12 ml by mouth in the evening 01/05/14  Yes Elveria Risingina Goodpasture, NP  lactulose (CHRONULAC) 10 GM/15ML solution Take 30 mLs (20 g total) by mouth 2 (two) times daily. 03/31/13  Yes Bryan R Hess, DO  ondansetron (ZOFRAN) 4 MG/5ML solution Take 5 mLs (4 mg total) by mouth every 8 (eight) hours as needed for nausea or vomiting. 09/26/13  Yes Shelva MajesticStephen O Hunter, MD  pantoprazole sodium (PROTONIX) 40 mg/20 mL PACK Place 20 mg into feeding tube daily.   Yes Historical Provider, MD  Rufinamide Mariane Duval(BANZEL) 40 MG/ML SUSP Take 5 mLs by mouth 2 (two) times daily.   Yes Historical Provider, MD  TOPAMAX SPRINKLE 25 MG capsule Take 6 capsules in the morning and 6 capsules in the evening with food 01/05/14  Yes Elveria Risingina Goodpasture, NP   BP 104/74  Pulse 79  Temp(Src) 97.4 F (36.3 C) (Axillary)  Resp 15  Ht 4\' 11"  (1.499 m)   Wt 197 lb (89.359 kg)  BMI 39.77 kg/m2  SpO2 100%  LMP 02/17/2014 Physical Exam  Constitutional: She appears well-developed and well-nourished. No distress.  HENT:  Head: Normocephalic and atraumatic.  Mouth/Throat: Oropharynx is clear and moist. No oropharyngeal exudate.  Angular chelitis of mouth Small laceration inner upper lip   Eyes: Conjunctivae are normal. Pupils are equal, round, and reactive to light.  Neck: Normal range of motion. Neck supple.  Cardiovascular: Normal rate, regular rhythm and normal heart sounds.   Pulmonary/Chest: Effort normal and breath sounds normal. No respiratory distress.  Decreased breath sounds throughout  Abdominal: Soft. There is no tenderness. There is no rebound and no guarding.  Musculoskeletal: She exhibits edema.  Pitting edema of upper and lower extremities bilaterally  Neurological: She is alert. No cranial nerve deficit. She exhibits normal muscle tone. Coordination normal.  Nonverbal, moves all extremities, does not follow commands  Skin: Skin is warm.    ED Course  Procedures (including critical care time) Labs Review Labs Reviewed  CBC WITH DIFFERENTIAL - Abnormal; Notable for the following:    RBC 3.25 (*)    Hemoglobin 11.6 (*)    HCT 35.2 (*)    MCV 108.3 (*)    MCH 35.7 (*)    RDW 20.6 (*)    Platelets 122 (*)    All other components within normal limits  COMPREHENSIVE METABOLIC PANEL - Abnormal; Notable for the following:    Glucose, Bld 63 (*)    Albumin 3.2 (*)    AST 58 (*)    GFR calc non Af Amer 86 (*)    All other components within normal limits  URINALYSIS, ROUTINE W REFLEX MICROSCOPIC - Abnormal; Notable for the following:    Color, Urine AMBER (*)    Specific Gravity, Urine 1.037 (*)    Bilirubin Urine SMALL (*)    Ketones, ur 40 (*)    Protein, ur 30 (*)    Urobilinogen, UA 2.0 (*)    All other components within normal limits  PRO B NATRIURETIC PEPTIDE - Abnormal; Notable for the following:    Pro B  Natriuretic peptide (BNP) 147.2 (*)    All other components within normal limits  VALPROIC ACID LEVEL - Abnormal; Notable for the following:    Valproic Acid Lvl 122.2 (*)    All other components within normal limits  URINE MICROSCOPIC-ADD ON - Abnormal; Notable for the following:    Casts HYALINE CASTS (*)  All other components within normal limits  AMMONIA  CBG MONITORING, ED    Imaging Review Ct Head Wo Contrast  03/29/2014   CLINICAL DATA:  Seizure last Wednesday, fall, hit face and forehead  EXAM: CT HEAD WITHOUT CONTRAST  TECHNIQUE: Contiguous axial images were obtained from the base of the skull through the vertex without intravenous contrast.  COMPARISON:  02/15/2011.  FINDINGS: No skull fracture is noted. Paranasal sinuses and mastoid air cells are unremarkable. No intracranial hemorrhage, mass effect or midline shift. No acute cortical infarction. No mass lesion is noted on this unenhanced scan. No hydrocephalus. The gray and white-matter differentiation is preserved. Probable punctate calcification left basal ganglia.  IMPRESSION: No acute intracranial abnormality.   Electronically Signed   By: Natasha Mead M.D.   On: 03/29/2014 11:44   Dg Chest Portable 1 View  03/29/2014   CLINICAL DATA:  Nasal congestion  EXAM: PORTABLE CHEST - 1 VIEW  COMPARISON:  January 30, 2011  FINDINGS: Lungs are clear. The heart size and pulmonary vascularity are normal. No adenopathy. No bone lesions.  IMPRESSION: No edema or consolidation.   Electronically Signed   By: Bretta Bang M.D.   On: 03/29/2014 12:31     EKG Interpretation None      MDM   Final diagnoses:  Valproic acid causing adverse effect in therapeutic use, initial encounter   Altered mental status after seizure last week, concern for pneumonia or other infection. No fever.  CT head negative. Depakote level elevated 122. UA negative. CXR negative.  D/w Dr. Zollie Scale of pediatric neurology on call for Dr. Sharene Skeans.  Ammonia  level  50. Patient now awake and alert and eating. Mother states she is at her baseline. She is anxious to take patient home.  Instructed to hold next 2 doses of Depakote and followup with PCP tomorrow. Discussed with family practice resident Dr. Caleb Popp who saw patient.  Dr. Caleb Popp d/w Dr. Deirdre Priest who feels patient can follow up as outpatient.  Dr. Sharol Given elevated Depakote level was obtained at peak level.  Follow up with PCP tomorrow.  Mother feels patient is at baseline. She is awake, smiling, interactive, and eating in the room.  Return precautions discussed.  BP 104/74  Pulse 79  Temp(Src) 97.4 F (36.3 C) (Axillary)  Resp 15  Ht 4\' 11"  (1.499 m)  Wt 197 lb (89.359 kg)  BMI 39.77 kg/m2  SpO2 100%  LMP 02/17/2014    Glynn Octave, MD 03/29/14 6314

## 2014-03-29 NOTE — ED Notes (Signed)
Care giver stated, she's not eating good, and she is congested.  She had a seizure on Wednesday, and she's just not been herself.  I think she is dehydrated, and maybe have some pneumonia.

## 2014-03-29 NOTE — ED Notes (Signed)
Reports having a bad seizure on Wednesday.  Pt is not eating well.  Runny nose.  Denies fever, n/v/d.  Caregiver states that she feels like she might have pneumonia.

## 2014-03-29 NOTE — ED Notes (Signed)
Awaiting to call Poison Control for Ammonia level, Nikki, RN at bedside attempting IV access & to collect ammonia

## 2014-03-29 NOTE — ED Notes (Signed)
Poison Control notified, [facility] recommends to withhold tonights dose of Valporic Acid & to follow up with PCP

## 2014-03-29 NOTE — Consult Note (Signed)
[facility] Consult Note Service Pager: [phone]  [name] Medical record number: [id number] Date of birth: June 03, 1986 Age: 28 y.o. Gender: female  Primary Care Provider: Gildardo CrankerHess, Bryan, DO Code Status: Full code  Chief Complaint: Concern for pneumonia  Assessment and Plan: Michele [name] is a 28 y.o. female presenting with decreased PO intake and elevated Depakote level. Patient at baseline during my evaluation and mom states she is comfortable taking patient home. CXR and head CT reassuring.  Plan:  Depakote level slightly elevated. Patient took dose this morning. Patient to hold Depakote level this evening and [facility]  Encouraged to take adequate fluid intake to keep from getting dehydrated   Disposition: Discharge home with follow-up with PCP and Neurologist  History of Present Illness: Michele [name] is a 28 y.o. female presenting with altered mental status. She has a history of cerebral palsy and generalized convulsive epilepsy. History provided by the patient's mother. Patient has been less interactive than her baseline and has been eating less since about one week ago. She has an associated cough and rhinorrhea. No sneezing, no fevers, no nausea or vomiting. No known sick contacts, although mom think the patient probably has a viral infection. The patient goes to a day program but patient's mother has not heard anything about someone being sick. Patient has a recent episode of a grand mal seizure 5 days ago where she fell and hit her head. No medical care was sought at that time. The patient's mother brought her in to the urgent care for evaluation of possible pneumonia. From the urgent care, the patient was transported to the ED for work-up. CT head and chest-xray without acute processes. Depakote elevated at 122. D50 given for low blood sugar of 67 and IVF given for gentle  rehydration.  Review Of Systems: Per HPI with the following additions:  Otherwise 12 point review of systems was performed and was unremarkable.  Patient Active Problem List   Diagnosis Date Noted  . Laceration re-check 02/05/2014  . 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 Encounter for long-term (current) use of other medications 12/24/2012  . Morbid obesity 12/24/2012  . Congenital quadriplegia 12/24/2012  . Leg swelling 03/10/2011  . URINARY INCONTINENCE 02/25/2010  . 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 HYPOTHYROIDISM 01/29/2009  . MENTAL RETARDATION, PROFOUND 01/29/2009  . Infantile cerebral palsy 01/29/2009  . GASTROESOPHAGEAL REFLUX DISEASE 01/29/2009  . OSTEOPOROSIS 01/29/2009  . SEIZURE DISORDER 01/29/2009   Past Medical History: Past Medical History  Diagnosis Date  . Cerebral palsy   . Lennox-Gastaut syndrome   . Profound mental retardation   . Osteoporosis   . Rickets   . Hypothyroidism   . Seizures   . Reflux    Past Surgical History: Past Surgical History  Procedure Laterality Date  . Peg tube placement    . Gastrocutaneous fistual repair  2012   Social History: History  Substance Use Topics  . Smoking status: Never Smoker   . Smokeless tobacco: Never Used  . Alcohol Use: No   Additional social history:   Please also refer to relevant sections of EMR.  Family History: Family History  Problem Relation Age of Onset  . Diabetes      MGGM  . Heart disease      MGGF  . Kidney disease      Maternal [place]  Aunt   . Colon cancer Neg Hx   . Hypertension Mother   . Cancer Maternal Aunt     lung  . Heart disease Maternal Grandmother     Died at 38  . Stroke Maternal Grandfather     died at 53  . Hypertension Paternal Grandmother    Allergies and Medications: Allergies  Allergen Reactions  . Carbamazepine Rash   No current facility-administered medications on  file prior to encounter.   Current Outpatient Prescriptions on File Prior to Encounter  Medication Sig Dispense Refill  . alendronate (FOSAMAX) 70 MG tablet Take 70 mg by mouth once a week. Take on Saturdays with a full glass of water on an empty stomach.      . Calcium Carbonate-Vitamin D (CALCARB 600/D) 600-400 MG-UNIT per tablet Take 1 tablet by mouth daily. 1 tab per tube 2 time a day      . CARNITOR 1 GM/10ML solution Give 5ml by mouth 3 times per day  510 mL  5  . clotrimazole (LOTRIMIN) 1 % cream Apply to affected area 2 times daily  30 g  1  . DEPAKOTE SPRINKLES 125 MG capsule Give 8 capsules in the morning and 8 capsules in the evening with food  544 capsule  5  . folic acid (FOLVITE) 1 MG tablet Take 1 mg by mouth every morning. 1 tab per tube daily      . furosemide (LASIX) 20 MG tablet Take 20-40 mg by mouth daily.      Marland Kitchen KEPPRA 100 MG/ML solution Give 12 ml by mouth in the morning and 12 ml by mouth in the evening  816 mL  5  . lactulose (CHRONULAC) 10 GM/15ML solution Take 30 mLs (20 g total) by mouth 2 (two) times daily.  240 mL  11  . ondansetron (ZOFRAN) 4 MG/5ML solution Take 5 mLs (4 mg total) by mouth every 8 (eight) hours as needed for nausea or vomiting.  50 mL  1  . TOPAMAX SPRINKLE 25 MG capsule Take 6 capsules in the morning and 6 capsules in the evening with food  408 capsule  5    Objective: BP 99/71  Pulse 64  Temp(Src) 97.4 F (36.3 C) (Axillary)  Resp 14  Ht 4\' 11"  (1.499 m)  Wt 197 lb (89.359 kg)  BMI 39.77 kg/m2  SpO2 100%  LMP 02/17/2014 Exam: General: Laying in bed, no distress, patient is non-verbal but responsive to exam, makes eye contact and smiled at me during exam HEENT: PERRL, moist mucous membranes Cardiovascular: Regular rate and rhythm, without murmur Respiratory: Anterior auscultation clear bilaterally without wheezing Abdomen: Soft, does not appear tender, non-tender Extremities: Bilateral leg edema, no tenderness, 1+ DP Skin:  hypopigmented patch located around chin and below lower lip Neuro: Alert, moves upper extremities spontaneously  Labs and Imaging: CBC BMET   Recent Labs Lab 03/29/14 1103  WBC 7.1  HGB 11.6*  HCT 35.2*  PLT 122*    Recent Labs Lab 03/29/14 1103  NA 142  K 3.7  CL 103  CO2 28  BUN 18  CREATININE 0.91  GLUCOSE 63*  CALCIUM 9.1     Ct Head Wo Contrast  03/29/2014   CLINICAL DATA:  Seizure last Wednesday, fall, hit face and forehead  EXAM: CT HEAD WITHOUT CONTRAST  TECHNIQUE: Contiguous axial images were obtained from the base of the skull through the vertex without intravenous contrast.  COMPARISON:  02/15/2011.  FINDINGS: No skull fracture is noted. Paranasal  sinuses and mastoid air cells are unremarkable. No intracranial hemorrhage, mass effect or midline shift. No acute cortical infarction. No mass lesion is noted on this unenhanced scan. No hydrocephalus. The gray and white-matter differentiation is preserved. Probable punctate calcification left basal ganglia.  IMPRESSION: No acute intracranial abnormality.   Electronically Signed   By: Natasha MeadLiviu  Pop M.D.   On: 03/29/2014 11:44   Dg Chest Portable 1 View  03/29/2014   CLINICAL DATA:  Nasal congestion  EXAM: PORTABLE CHEST - 1 VIEW  COMPARISON:  January 30, 2011  FINDINGS: Lungs are clear. The heart size and pulmonary vascularity are normal. No adenopathy. No bone lesions.  IMPRESSION: No edema or consolidation.   Electronically Signed   By: Bretta BangWilliam  Woodruff M.D.   On: 03/29/2014 12:31    Jacquelin Hawkingalph Umar Patmon, MD 03/29/2014, 1:30 PM PGY-2,  Family Medicine FPTS Intern pager: [phone], text pages welcome

## 2014-03-29 NOTE — Discharge Instructions (Signed)
Drug Toxicity As discussed, old Depakote dose tonight and tomorrow morning. Follow up with her PCP tomorrow. Return to the ED if you develop new or worsening symptoms. You are having a reaction to your medicine. This does not mean you are allergic to the drug. Medicines can have many different side effects and toxic reactions. These include:  Stomach symptoms, such as nausea, vomiting, cramps, diarrhea, bloating, constipation, and dry mouth.  Nervous system symptoms, such as weakness, muscle spasms, drowsiness, confusion, agitation, and headache.  Heart and blood vessel symptoms, such as fainting, irregular heartbeat (palpitations), and fast heartbeat.  Skin symptoms, such as itching, light sensitivity, and rashes. When taking more medicines, there is an increased chance of a drug interaction that can make you sick. Take your medicines as your caregiver recommends. Keep a list of the names and doses of each of your drugs. Avoid alcohol and street drugs. Call your caregiver if you are worried about drug side effects or toxicity. Document Released: 06/05/2005 Document Revised: 08/28/2011 Document Reviewed: 11/20/2006 [facility] Patient Information 2015 [place], [place]. This information is not intended to replace advice given to you by your health care provider. Make sure you discuss any questions you have with your health care provider.

## 2014-03-30 ENCOUNTER — Ambulatory Visit (INDEPENDENT_AMBULATORY_CARE_PROVIDER_SITE_OTHER): Payer: Medicaid Other | Admitting: Family Medicine

## 2014-03-30 VITALS — BP 118/76 | HR 91

## 2014-03-30 DIAGNOSIS — G40909 Epilepsy, unspecified, not intractable, without status epilepticus: Secondary | ICD-10-CM

## 2014-03-30 LAB — CBG MONITORING, ED: Glucose-Capillary: 101 mg/dL — ABNORMAL HIGH (ref 70–99)

## 2014-03-30 NOTE — Patient Instructions (Signed)
Thank you for bringing Sheralyn BoatmanRobin Bagnall into office today.  1. I spoke with Dr. Devonne DoughtyNabizadeh (Dr. Darl HouseholderHickling's partner) on the phone today, he was the Neurologist that discussed Takelia's case yesterday. He recommended that you start taking the Depakote as normal with tonight 3 pm dose, and continue it for the next few days without any missed doses. We do not need to check a lab today, we will order a future lab to be drawn and it needs to be checked on Thursday morning (BEFORE the morning dose, hold it until after the blood is drawn, then give the dose). The next day, Friday morning call Dr. Darl HouseholderHickling's office and request to speak with the nurse or doctor and they will discuss the results with you and tell you what to do. I would also recommend scheduling an appointment to be seen at their office as soon as possible.  Please schedule a follow-up with Dr. Sharene SkeansHickling as soon as possible. Please schedule a follow-up appointment with Dr. Paulina FusiHess as needed within 1-2 weeks.  If you have any other questions or concerns, please feel free to call the clinic to contact me. You may also schedule an earlier appointment if necessary.  However, if your symptoms get significantly worse, please go to the Emergency Department to seek immediate medical attention.  Saralyn PilarAlexander Karamalegos, DO [facility]

## 2014-03-30 NOTE — Progress Notes (Signed)
   Subjective:    Patient ID: Michele [name], female    DOB: 1986-02-25, 28 y.o.   MRN: [id number]  Patient presents for a same day appointment.  HPI  SEIZURE DISORDER / ELEVATED DEPAKOTE LVL: - Mother reported had a seizure on Wednesday (prior last seizure 1 month ago, normal pattern) and fell, hit her face, described as being "unsteady and not eating right", still with decreased appetite overall improved but still not resolved, episode preceded by URI symptoms with runny nose and slight cough (non-productive), no fevers. Currently holding lactulose unable to tell if diarrhea. Today she seems "more like herself but still seems a little sluggish". No more seizures, but has some eye twitching (bilateral eye, horizontal) normal for her to have these every day - Denies fevers/chills, vomiting, abdominal pain, shortness of breath, focal deficits - [facility]14[facility] contacted Pediatric Neurology (Dr. Devonne DoughtyNabizadeh, normally followed by Dr. Sharene SkeansHickling), checked Depakote level at 122, advised to skip 2 doses, and follow-up at PCP for re-evaluation and possible re-check, at that time patient was reportedly well appearing and returning to baseline  I have reviewed and updated the following as appropriate: allergies and current medications  Social Hx: - Lives with mother, primary caregiver - No smoke exposure  Review of Systems  See above HPI    Objective:   Physical Exam  BP 118/76  Pulse 91  LMP 02/17/2014  Gen - sitting in wheelchair, baseline verbal communication and gestures with environment, NAD HEENT - NCAT, PERRL, makes occasional eye contact, without obvious nystagmus, MMM Neck - supple, non-tender Heart - RRR, no murmurs heard Lungs - CTAB. Normal work of breathing. Abd - soft, NTND, no masses, +active BS Skin - warm, dry Neuro - awake, alert, interactive on exam, spontaneous movements of bilateral upper ext and head, grossly non-focal     Assessment &  Plan:   See specific A&P problem list for details.

## 2014-03-30 NOTE — Consult Note (Signed)
I discussed with Dr Mal MistyNetty before discharge from ER

## 2014-03-31 ENCOUNTER — Encounter: Payer: Self-pay | Admitting: Family Medicine

## 2014-03-31 ENCOUNTER — Telehealth: Payer: Self-pay | Admitting: *Deleted

## 2014-03-31 DIAGNOSIS — G40319 Generalized idiopathic epilepsy and epileptic syndromes, intractable, without status epilepticus: Secondary | ICD-10-CM

## 2014-03-31 DIAGNOSIS — Z79899 Other long term (current) drug therapy: Secondary | ICD-10-CM

## 2014-03-31 NOTE — Telephone Encounter (Signed)
[name], mom, stated the pt went to urgent care on 03/29/14. [facility]. The mother said the pt had a seizure and fell down and hit her face. The mother said that the pt has been off balance. The ED did lab tests and CAT scan. The mother stated that the pt's depakote levels were elevated. The mother said the pt had to hold off on taking the medicine for two days-did not take it on Sunday night and Monday morning. The pt began taking the medicine yesterday afternoon and today this morning. The mother would like for you to send a lab order to [facility] to check levels. The mother also wanted to speak to you about paperwork she left for you last week. The mother can be reached on her cell on [phone] or her home at [phone].

## 2014-03-31 NOTE — Assessment & Plan Note (Addendum)
Consistent with normal pattern seizure activity, last episode 1 week ago with some persistent decreased activity/appetite and URI symptoms, recently found to have elevated Depakote level in ED on 03/29/14 up to 122, currently missed x 2 doses, and appears to be near baseline functioning - Followed by Dr. Sharene SkeansHickling (Peds Neuro)  Plan: 1. Contacted Dr. Devonne DoughtyNabizadeh (Peds Neuro emergency line) who had provided advice when pt in ED on 10/11, today he stated that after holding Depakote x 2 doses from prior level of 122, anticipate it to have returned to therapeutic range. Recommended to resume Depakote normal dosing, starting at 3pm today, continue BID dosing. [facility] (on Thursday 10/15) and HOLD AM dose Depakote for lab only Depakote level (then resume dose after lab draw), advised patient the following day (Fri 10/16) to call his office Peds Neuro to discuss results and get advice on further Depakote dose adjustment. 2. Placed order for future Dep[facility], on Thurs 04/02/14 3. Advised Mother on above plan, agrees to lab draw and will follow-up with Dr. Sharene SkeansHickling for further dosing adjustment 4. If worsening or recurrent seizure activity advised to bring back to ED for re-evaluation 5. Recommended close follow-up with Peds Neuro

## 2014-03-31 NOTE — Telephone Encounter (Signed)
I called and talked to Mom. She said that she needs to talk Michele [name] tomorrow to get her Depakote level drawn and that she needs an order sent to [facility]. She said that she will take her before her dose tomorrow morning to [facility] on [address]. I will fax an order there. Mom also needs to pick up CMN for generator and alarm system. I told Mom that she could pick up forms tomorrow. TG

## 2014-04-01 LAB — VALPROIC ACID LEVEL: VALPROIC ACID LVL: 92.6 ug/mL (ref 50.0–100.0)

## 2014-04-02 ENCOUNTER — Other Ambulatory Visit: Payer: Self-pay

## 2014-04-02 ENCOUNTER — Inpatient Hospital Stay (HOSPITAL_COMMUNITY)
Admission: EM | Admit: 2014-04-02 | Discharge: 2014-04-07 | DRG: 689 | Disposition: A | Discharge status: Home Health | Payer: Medicaid Other | Attending: Family Medicine | Admitting: Family Medicine

## 2014-04-02 ENCOUNTER — Encounter (HOSPITAL_COMMUNITY): Payer: Self-pay | Admitting: Emergency Medicine

## 2014-04-02 ENCOUNTER — Emergency Department (HOSPITAL_COMMUNITY): Payer: Medicaid Other

## 2014-04-02 ENCOUNTER — Inpatient Hospital Stay (HOSPITAL_COMMUNITY): Payer: Medicaid Other

## 2014-04-02 ENCOUNTER — Other Ambulatory Visit: Payer: Medicaid Other

## 2014-04-02 ENCOUNTER — Other Ambulatory Visit: Payer: Self-pay | Admitting: Family

## 2014-04-02 DIAGNOSIS — M810 Age-related osteoporosis without current pathological fracture: Secondary | ICD-10-CM | POA: Diagnosis present

## 2014-04-02 DIAGNOSIS — Z9889 Other specified postprocedural states: Secondary | ICD-10-CM

## 2014-04-02 DIAGNOSIS — N390 Urinary tract infection, site not specified: Principal | ICD-10-CM | POA: Diagnosis present

## 2014-04-02 DIAGNOSIS — E860 Dehydration: Secondary | ICD-10-CM

## 2014-04-02 DIAGNOSIS — K5900 Constipation, unspecified: Secondary | ICD-10-CM | POA: Diagnosis present

## 2014-04-02 DIAGNOSIS — R32 Unspecified urinary incontinence: Secondary | ICD-10-CM | POA: Diagnosis present

## 2014-04-02 DIAGNOSIS — D509 Iron deficiency anemia, unspecified: Secondary | ICD-10-CM | POA: Diagnosis present

## 2014-04-02 DIAGNOSIS — G40319 Generalized idiopathic epilepsy and epileptic syndromes, intractable, without status epilepticus: Secondary | ICD-10-CM | POA: Diagnosis present

## 2014-04-02 DIAGNOSIS — R630 Anorexia: Secondary | ICD-10-CM | POA: Diagnosis present

## 2014-04-02 DIAGNOSIS — K219 Gastro-esophageal reflux disease without esophagitis: Secondary | ICD-10-CM | POA: Diagnosis present

## 2014-04-02 DIAGNOSIS — G809 Cerebral palsy, unspecified: Secondary | ICD-10-CM

## 2014-04-02 DIAGNOSIS — G40909 Epilepsy, unspecified, not intractable, without status epilepticus: Secondary | ICD-10-CM

## 2014-04-02 DIAGNOSIS — R21 Rash and other nonspecific skin eruption: Secondary | ICD-10-CM | POA: Diagnosis present

## 2014-04-02 DIAGNOSIS — F73 Profound intellectual disabilities: Secondary | ICD-10-CM | POA: Diagnosis present

## 2014-04-02 DIAGNOSIS — G808 Other cerebral palsy: Secondary | ICD-10-CM | POA: Diagnosis present

## 2014-04-02 DIAGNOSIS — R5383 Other fatigue: Secondary | ICD-10-CM | POA: Diagnosis present

## 2014-04-02 DIAGNOSIS — Z9109 Other allergy status, other than to drugs and biological substances: Secondary | ICD-10-CM | POA: Diagnosis not present

## 2014-04-02 DIAGNOSIS — R159 Full incontinence of feces: Secondary | ICD-10-CM | POA: Diagnosis present

## 2014-04-02 DIAGNOSIS — Z79899 Other long term (current) drug therapy: Secondary | ICD-10-CM

## 2014-04-02 DIAGNOSIS — Z6835 Body mass index (BMI) 35.0-35.9, adult: Secondary | ICD-10-CM

## 2014-04-02 DIAGNOSIS — G40812 Lennox-Gastaut syndrome, not intractable, without status epilepticus: Secondary | ICD-10-CM | POA: Diagnosis present

## 2014-04-02 DIAGNOSIS — E550 Rickets, active: Secondary | ICD-10-CM | POA: Diagnosis present

## 2014-04-02 DIAGNOSIS — N3000 Acute cystitis without hematuria: Secondary | ICD-10-CM

## 2014-04-02 DIAGNOSIS — E039 Hypothyroidism, unspecified: Secondary | ICD-10-CM | POA: Diagnosis present

## 2014-04-02 DIAGNOSIS — B952 Enterococcus as the cause of diseases classified elsewhere: Secondary | ICD-10-CM | POA: Diagnosis present

## 2014-04-02 LAB — COMPREHENSIVE METABOLIC PANEL
ALBUMIN: 3.4 g/dL — AB (ref 3.5–5.2)
ALT: 34 U/L (ref 0–35)
AST: 44 U/L — ABNORMAL HIGH (ref 0–37)
Alkaline Phosphatase: 89 U/L (ref 39–117)
Anion gap: 11 (ref 5–15)
BUN: 15 mg/dL (ref 6–23)
CO2: 28 mEq/L (ref 19–32)
CREATININE: 0.79 mg/dL (ref 0.50–1.10)
Calcium: 10.1 mg/dL (ref 8.4–10.5)
Chloride: 104 mEq/L (ref 96–112)
GFR calc Af Amer: >90 mL/min (ref >90)
GFR calc non Af Amer: >90 mL/min (ref >90)
Glucose, Bld: 80 mg/dL (ref 70–99)
Potassium: 3.9 mEq/L (ref 3.7–5.3)
Sodium: 143 mEq/L (ref 137–147)
Total Bilirubin: 0.3 mg/dL (ref 0.3–1.2)
Total Protein: 7.8 g/dL (ref 6.0–8.3)

## 2014-04-02 LAB — LACTIC ACID, PLASMA: Lactic Acid, Venous: 0.8 mmol/L (ref 0.5–2.2)

## 2014-04-02 LAB — CBC WITH DIFFERENTIAL/PLATELET
Basophils Absolute: 0 10*3/uL (ref 0.0–0.1)
Basophils Relative: 0 % (ref 0–1)
EOS PCT: 1 % (ref 0–5)
Eosinophils Absolute: 0.1 10*3/uL (ref 0.0–0.7)
HCT: 37.7 % (ref 36.0–46.0)
Hemoglobin: 11.9 g/dL — ABNORMAL LOW (ref 12.0–15.0)
Lymphocytes Relative: 31 % (ref 12–46)
Lymphs Abs: 2.2 10*3/uL (ref 0.7–4.0)
MCH: 35.6 pg — ABNORMAL HIGH (ref 26.0–34.0)
MCHC: 31.6 g/dL (ref 30.0–36.0)
MCV: 112.9 fL — ABNORMAL HIGH (ref 78.0–100.0)
MONO ABS: 0.9 10*3/uL (ref 0.1–1.0)
Monocytes Relative: 13 % — ABNORMAL HIGH (ref 3–12)
NEUTROS PCT: 55 % (ref 43–77)
Neutro Abs: 3.8 10*3/uL (ref 1.7–7.7)
PLATELETS: 124 10*3/uL — AB (ref 150–400)
RBC: 3.34 MIL/uL — AB (ref 3.87–5.11)
RDW: 21.6 % — ABNORMAL HIGH (ref 11.5–15.5)
WBC: 7 10*3/uL (ref 4.0–10.5)

## 2014-04-02 LAB — URINALYSIS, ROUTINE W REFLEX MICROSCOPIC
GLUCOSE, UA: NEGATIVE mg/dL
Ketones, ur: 15 mg/dL — AB
Leukocytes, UA: NEGATIVE
Nitrite: NEGATIVE
PH: 6.5 (ref 5.0–8.0)
Protein, ur: 30 mg/dL — AB
Specific Gravity, Urine: 1.041 — ABNORMAL HIGH (ref 1.005–1.030)
Urobilinogen, UA: 1 mg/dL (ref 0.0–1.0)

## 2014-04-02 LAB — URINE MICROSCOPIC-ADD ON

## 2014-04-02 LAB — CBG MONITORING, ED: GLUCOSE-CAPILLARY: 87 mg/dL (ref 70–99)

## 2014-04-02 MED ORDER — SODIUM CHLORIDE 0.9 % IV BOLUS (SEPSIS)
1000.0000 mL | Freq: Once | INTRAVENOUS | Status: AC
  Administered 2014-04-02: 1000 mL via INTRAVENOUS

## 2014-04-02 MED ORDER — LEVOCARNITINE 1 GM/10ML PO SOLN
500.0000 mg | Freq: Three times a day (TID) | ORAL | Status: DC
Start: 2014-04-02 — End: 2014-04-02
  Administered 2014-04-02: 500 mg
  Filled 2014-04-02: qty 5

## 2014-04-02 MED ORDER — RUFINAMIDE 40 MG/ML PO SUSP
200.0000 mg | ORAL | Status: DC
  Administered 2014-04-03 – 2014-04-07 (×11): 200 mg via ORAL
  Filled 2014-04-02 (×4): qty 5

## 2014-04-02 MED ORDER — ONDANSETRON HCL 4 MG PO TABS
4.0000 mg | ORAL_TABLET | Freq: Four times a day (QID) | ORAL | Status: DC | PRN
Start: 2014-04-02 — End: 2014-04-07

## 2014-04-02 MED ORDER — LEVOCARNITINE 1 GM/10ML PO SOLN
500.0000 mg | Freq: Three times a day (TID) | ORAL | Status: DC
  Administered 2014-04-03 – 2014-04-07 (×13): 500 mg via ORAL
  Filled 2014-04-02 (×16): qty 5

## 2014-04-02 MED ORDER — TOPIRAMATE 25 MG PO CPSP: 150.0000 mg | ORAL_CAPSULE | Freq: Two times a day (BID) | ORAL | Status: DC

## 2014-04-02 MED ORDER — PANTOPRAZOLE SODIUM 40 MG PO PACK: 20.0000 mg | PACK | Freq: Every day | ORAL | Status: DC

## 2014-04-02 MED ORDER — PANTOPRAZOLE SODIUM 40 MG PO PACK
20.0000 mg | PACK | Freq: Every day | ORAL | Status: DC
  Administered 2014-04-03 – 2014-04-07 (×5): 20 mg via ORAL
  Filled 2014-04-02 (×8): qty 20

## 2014-04-02 MED ORDER — HEPARIN SODIUM (PORCINE) 5000 UNIT/ML IJ SOLN
5000.0000 [IU] | Freq: Three times a day (TID) | INTRAMUSCULAR | Status: DC
  Administered 2014-04-02 – 2014-04-07 (×15): 5000 [IU] via SUBCUTANEOUS
  Filled 2014-04-02 (×19): qty 1

## 2014-04-02 MED ORDER — DIVALPROEX SODIUM 125 MG PO CPSP
1000.0000 mg | ORAL_CAPSULE | Freq: Two times a day (BID) | ORAL | Status: DC
  Administered 2014-04-03 – 2014-04-07 (×11): 1000 mg via ORAL
  Filled 2014-04-02 (×12): qty 8

## 2014-04-02 MED ORDER — LEVETIRACETAM 100 MG/ML PO SOLN
1200.0000 mg | Freq: Two times a day (BID) | ORAL | Status: DC
  Administered 2014-04-02 – 2014-04-07 (×11): 1200 mg via ORAL
  Filled 2014-04-02 (×13): qty 15

## 2014-04-02 MED ORDER — FOLIC ACID 1 MG PO TABS
1.0000 mg | ORAL_TABLET | Freq: Every morning | ORAL | Status: DC
  Administered 2014-04-03 – 2014-04-07 (×5): 1 mg via ORAL
  Filled 2014-04-02 (×6): qty 1

## 2014-04-02 MED ORDER — ONDANSETRON HCL 4 MG/2ML IJ SOLN: 4.0000 mg | Freq: Four times a day (QID) | INTRAMUSCULAR | Status: DC | PRN

## 2014-04-02 MED ORDER — SODIUM CHLORIDE 0.9 % IV SOLN
INTRAVENOUS | Status: DC
Start: 2014-04-02 — End: 2014-04-02

## 2014-04-02 MED ORDER — SODIUM CHLORIDE 0.9 % IV SOLN
INTRAVENOUS | Status: AC
  Administered 2014-04-02 – 2014-04-03 (×2): via INTRAVENOUS

## 2014-04-02 MED ORDER — RUFINAMIDE 40 MG/ML PO SUSP: 5.0000 mL | Freq: Two times a day (BID) | ORAL | Status: DC

## 2014-04-02 MED ORDER — TOPIRAMATE 25 MG PO CPSP
150.0000 mg | ORAL_CAPSULE | ORAL | Status: DC
  Administered 2014-04-03 – 2014-04-07 (×11): 150 mg via ORAL
  Filled 2014-04-02 (×5): qty 6

## 2014-04-02 NOTE — ED Provider Notes (Signed)
Pt with MR, CP and 1 week of cough and generalized fatigue - now she is not eating (hx of feeding tube in past X 2) and feeling weak.  She is bumping into things when she ambulates.  Had recent w/u with UC and ED and sent to ED today for further eval.  On exam has mild diffuse ttp on abd, no tachycardia, no resp distress or abnrmal lung sounds.  Appears slightly swollen diffusely.    Needs repeat w/u - check labs, xray, CT head, fluids,  I saw and evaluated the patient, reviewed the resident's note and I agree with the findings and plan.   Final diagnoses:  Lethargy      Michele RollerBrian D Blaiden Werth, MD 04/02/14 2042

## 2014-04-02 NOTE — ED Notes (Signed)
CALLED [facility] [phone] TO CHECK ON PROGRESS FOR PT TO HAVE HER LP. THEY ADVISE THAT THEY ARE WORKING  VERY HARD BUT IT WILL BE AFTER 330 OR 4 PM

## 2014-04-02 NOTE — ED Provider Notes (Signed)
I saw and evaluated the patient, reviewed the resident's note and I agree with the findings and plan.  Please see my separate note regarding my evaluation of the patient.    Vida RollerBrian D Sharif Rendell, MD 04/02/14 [phone]

## 2014-04-02 NOTE — ED Notes (Signed)
Patients mother states that her daughter has been "feeling bad with a cough".   She went to urgent care Sunday and they referred her here for same on Sunday.   Patient states she had some problems with her depakote levels and they are waiting on new results for those.   Mother states she just "acts weak.  She usually gets up and moves around, but she's not today".

## 2014-04-02 NOTE — Progress Notes (Signed)
Family stated that they wanted to be the ones to clean her and they have diapers to use on her.  I will go check on them and I offered my help as needed.

## 2014-04-02 NOTE — ED Provider Notes (Signed)
CSN: [id number]     Arrival date & time 04/02/14  0910 History   First MD Initiated Contact with Patient 04/02/14 [phone]     Chief Complaint  Patient presents with  . Weakness  . Cough    Patient is a 28 y.o. female presenting with weakness. The history is provided by a parent.  Weakness This is a recurrent problem. The current episode started in the past 7 days. The problem occurs constantly. The problem has been gradually worsening. Associated symptoms include anorexia, coughing and weakness. Pertinent negatives include no abdominal pain, chest pain, fever, headaches, nausea, neck pain, rash, sore throat or vomiting. Nothing aggravates the symptoms. She has tried nothing for the symptoms. The treatment provided no relief.   56 African American female with history of CP and seizures as well as no retardation who presents with cough, fatigue, and decreased by mouth intake. Patient was seen here recently and had an essentially negative workup. Patient's Depakote levels were high and the patient's mom was instructed to hold her meds twice. Mom has been compliant with these instructions. Patient continues to be lethargic at home with decreased urine output and decreased by mouth intake. Patient has not had any recent seizures. Patient was noted to have angular chelosis treated with clotrimazole. Patient has not been complaining of abdominal pain. Patient has not been febrile at home. Mom states patient appears about the same she did the last time she presented ED. Last time she urinated was yesterday morning. Mom has attempted to feed the patient and baby to which she has not been that eat. Past Medical History  Diagnosis Date  . Cerebral palsy   . Lennox-Gastaut syndrome   . Profound mental retardation   . Osteoporosis   . Rickets   . Hypothyroidism   . Seizures   . Reflux    Past Surgical History  Procedure Laterality Date  . Peg tube placement    . Gastrocutaneous fistual repair  2012    Family History  Problem Relation Age of Onset  . Diabetes      MGGM  . Heart disease      MGGF  . Kidney disease      Maternal Great Aunt   . Colon cancer Neg Hx   . Hypertension Mother   . Cancer Maternal Aunt     lung  . Heart disease Maternal Grandmother     Died at 71  . Stroke Maternal Grandfather     died at 62  . Hypertension Paternal Grandmother    History  Substance Use Topics  . Smoking status: Never Smoker   . Smokeless tobacco: Never Used  . Alcohol Use: No   OB History   Grav Para Term Preterm Abortions TAB SAB Ect Mult Living                 Review of Systems  Unable to perform ROS: Patient nonverbal  Constitutional: Positive for appetite change. Negative for fever.  HENT: Negative for rhinorrhea and sore throat.   Eyes: Negative for visual disturbance.  Respiratory: Positive for cough. Negative for chest tightness and shortness of breath.   Cardiovascular: Negative for chest pain and palpitations.  Gastrointestinal: Positive for anorexia. Negative for nausea, vomiting, abdominal pain and constipation.  Genitourinary: Negative for dysuria and hematuria.  Musculoskeletal: Negative for back pain and neck pain.  Skin: Negative for rash.  Neurological: Positive for weakness. Negative for dizziness and headaches.  Psychiatric/Behavioral: Negative for confusion.  All other  systems reviewed and are negative.   Allergies  Carbamazepine  Home Medications   Prior to Admission medications   Medication Sig Start Date End Date Taking? Authorizing Provider  alendronate (FOSAMAX) 70 MG tablet Take 70 mg by mouth once a week. Take on Saturdays with a full glass of water on an empty stomach.   Yes Historical Provider, MD  Calcium Carbonate-Vitamin D (CALCARB 600/D) 600-400 MG-UNIT per tablet Take 1 tablet by mouth 2 (two) times daily. 1 tab per tube 2 time a day   Yes Historical Provider, MD  CARNITOR 1 GM/10ML solution Give 5ml by mouth 3 times per day 01/05/14   Yes Elveria Risingina Goodpasture, NP  clotrimazole (LOTRIMIN) 1 % cream Apply 1 application topically 2 (two) times daily.   Yes Historical Provider, MD  divalproex (DEPAKOTE SPRINKLE) 125 MG capsule Take 1,000 mg by mouth 2 (two) times daily.   Yes Historical Provider, MD  folic acid (FOLVITE) 1 MG tablet Take 1 mg by mouth every morning. 1 tab per tube daily   Yes Historical Provider, MD  furosemide (LASIX) 20 MG tablet Take 20-40 mg by mouth every other day.    Yes Historical Provider, MD  lactulose (CHRONULAC) 10 GM/15ML solution Take 30 mLs (20 g total) by mouth 2 (two) times daily. 03/31/13  Yes Bryan R Hess, DO  levETIRAcetam (KEPPRA) 100 MG/ML solution Take 1,200 mg by mouth 2 (two) times daily. 12 mls   Yes Historical Provider, MD  ondansetron (ZOFRAN) 4 MG/5ML solution Take 5 mLs (4 mg total) by mouth every 8 (eight) hours as needed for nausea or vomiting. 09/26/13  Yes Shelva MajesticStephen O Hunter, MD  pantoprazole sodium (PROTONIX) 40 mg/20 mL PACK Place 20 mg into feeding tube daily.   Yes Historical Provider, MD  Rufinamide Mariane Duval(BANZEL) 40 MG/ML SUSP Take 5 mLs by mouth 2 (two) times daily.   Yes Historical Provider, MD  topiramate (TOPAMAX SPRINKLE) 25 MG capsule Take 150 mg by mouth 2 (two) times daily.   Yes Historical Provider, MD   BP 127/94  Pulse 78  Temp(Src) 97.4 F (36.3 C) (Oral)  Resp 20  SpO2 100%  LMP 02/17/2014 Physical Exam  Constitutional: She is oriented to person, place, and time. No distress.  Intellectually disabled, does not answer questions, lying supine and will response to voice but is not very interactive. Obese.   HENT:  Head: Normocephalic and atraumatic.  Mouth/Throat: Oropharynx is clear and moist.  Angular chelosis  Eyes: EOM are normal. Pupils are equal, round, and reactive to light.  Neck: Neck supple. No JVD present.  Cardiovascular: Normal rate, regular rhythm, normal heart sounds and intact distal pulses.  Exam reveals no gallop.   No murmur heard. Pulmonary/Chest:  Effort normal and breath sounds normal. She has no wheezes. She has no rales.  Abdominal: Soft. She exhibits no distension. There is no tenderness.  Musculoskeletal: Normal range of motion. She exhibits no tenderness.  Neurological: She is alert and oriented to person, place, and time. No cranial nerve deficit. She exhibits normal muscle tone.  Skin: Skin is warm and dry. No rash noted.  No petechiae  Psychiatric: Her behavior is normal.    ED Course  LUMBAR PUNCTURE Date/Time: 04/02/2014 3:52 PM Performed by: Maris BergerGUNALDA, Annaclaire Walsworth Authorized by: Maris BergerGUNALDA, Jonthan Leite Consent: Verbal consent obtained. Risks and benefits: risks, benefits and alternatives were discussed Consent given by: parent Patient understanding: patient states understanding of the procedure being performed Patient consent: the patient's understanding of the procedure matches consent given  Procedure consent: procedure consent matches procedure scheduled Relevant documents: relevant documents present and verified Test results: test results available and properly labeled Site marked: the operative site was marked Imaging studies: imaging studies available Required items: required blood products, implants, devices, and special equipment available Patient identity confirmed: arm band and hospital-assigned identification number Time out: Immediately prior to procedure a "time out" was called to verify the correct patient, procedure, equipment, support staff and site/side marked as required. Indications: evaluation for infection Local anesthetic: lidocaine 1% without epinephrine Anesthetic total: 5 ml Patient sedated: no Preparation: Patient was prepped and draped in the usual sterile fashion. Lumbar space: L4-L5 interspace Patient's position: right lateral decubitus Needle gauge: 22 Needle type: spinal needle - Quincke tip Needle length: 2.5 in Number of attempts: 3 Comments: No fluid retrieved     None  Labs Review Labs  Reviewed  CBC WITH DIFFERENTIAL - Abnormal; Notable for the following:    RBC 3.34 (*)    Hemoglobin 11.9 (*)    MCV 112.9 (*)    MCH 35.6 (*)    RDW 21.6 (*)    Platelets 124 (*)    Monocytes Relative 13 (*)    All other components within normal limits  COMPREHENSIVE METABOLIC PANEL - Abnormal; Notable for the following:    Albumin 3.4 (*)    AST 44 (*)    All other components within normal limits  URINALYSIS, ROUTINE W REFLEX MICROSCOPIC - Abnormal; Notable for the following:    Color, Urine AMBER (*)    APPearance CLOUDY (*)    Specific Gravity, Urine 1.041 (*)    Hgb urine dipstick TRACE (*)    Bilirubin Urine SMALL (*)    Ketones, ur 15 (*)    Protein, ur 30 (*)    All other components within normal limits  URINE MICROSCOPIC-ADD ON - Abnormal; Notable for the following:    Bacteria, UA MANY (*)    All other components within normal limits  CULTURE, BLOOD (ROUTINE X 2)  CULTURE, BLOOD (ROUTINE X 2)  URINE CULTURE  CSF CULTURE  GRAM STAIN  VALPROIC ACID LEVEL  CSF CELL COUNT WITH DIFFERENTIAL  CSF CELL COUNT WITH DIFFERENTIAL  GLUCOSE, CSF  PROTEIN, CSF  CBG MONITORING, ED    Imaging Review Dg Chest 2 View  04/02/2014   CLINICAL DATA:  Shortness of breath and weakness. History of cerebral palsy.  EXAM: CHEST  2 VIEW  COMPARISON:  03/29/2014  FINDINGS: Two views of the chest demonstrate very low lung volumes. Heart size is grossly normal based on the low inspiratory effort. No evidence for pleural effusions on the lateral view.  IMPRESSION: Low lung volumes without focal disease.   Electronically Signed   By: Richarda Overlie M.D.   On: 04/02/2014 11:39   Ct Head Wo Contrast  04/02/2014   CLINICAL DATA:  Seizure, fell down, hit face, lethargy  EXAM: CT HEAD WITHOUT CONTRAST  TECHNIQUE: Contiguous axial images were obtained from the base of the skull through the vertex without intravenous contrast.  COMPARISON:  03/29/2014  FINDINGS: No skull fracture is noted. No  intracranial hemorrhage, mass effect or midline shift. The gray and white-matter differentiation is preserved. No hydrocephalus. No acute cortical infarction. No mass lesion is noted on this unenhanced scan.  IMPRESSION: No acute intracranial abnormality.  No significant change.   Electronically Signed   By: Natasha Mead M.D.   On: 04/02/2014 12:19   MDM   Final diagnoses:  Lethargy  28 yo F with CP and seizures who presents with lethargy and cough. No improvement since ED visit on 10/11. Afebrile, VSS. Appears lethargic on exam. CXR neg. Urine negative. Remainder of labs unremarkable. Blood cultures collected. CT head negative. LP attempted by landmarks x 3 but was unsuccessful. Patient will go do IR for fluoroscopic-guided LP. [facility]. Patient remains hemodynamically stable.  Will go to floor after LP.    Case discussed with Dr. Hyacinth MeekerMiller.   Maris BergerJonah Kyliah Deanda, MD 04/02/14 [phone]

## 2014-04-02 NOTE — ED Notes (Signed)
GAVE PT FAMILY A SNACK

## 2014-04-02 NOTE — ED Notes (Signed)
MD at bedside completing lumbar puncture

## 2014-04-02 NOTE — H&P (Signed)
Family Medicine Teaching [facility] Admission History and Physical Service Pager: [phone]  Patient name: Michele PineRobin C Starrett Medical record number: [id number] Date of birth: March 01, 1986 Age: 28 y.o. Gender: female  Primary Care Provider: Gildardo CrankerHess, Bryan, DO Consultants: None Code Status: Full  Chief Complaint:   Assessment and Plan: Michele PineRobin C Shor is a 28 y.o. female presenting with new-onset fatigue and weakness. PMH is significant for CP, MR, seizures, and reflux.   #Fatigue with associated weakness, presumed dehydration - New-onset x 1 week. Suspected dehydration due to poor PO intake with decreased UOP, clinical dry MM on exam. Less likely infection (lactic acid 0.8). Possible viral URI and less likely PNA with negative CXR, unlikely meningitis (afebrile, WBC 7.0, reassuring exam with supple neck, some improved mobility), initial concern for meningitis on eval by ED given AMS and lethargy, LP attempted under fluoro unsuccessful, decided to hold off. Patient has had some recent URI symptoms, suspect likely viral etiology. Patient usually able to ambulate some at home. -admit to floor; vitals per protocol  -Labs: CBC, BMP, valproic acid -PT/OT consulted  -EKG NSR without acute ST-T wave changes, no prolong QT -repeat CXR in AM to r/o developing PNA -prn zofran -continue IVF rehydration and resume PO as tolerated  #Seizures - Hx of difficult to manage seizures, followed closely by Peds Neuro (Dr. Sharene SkeansHickling). Last seizure was 1 week ago (hx of regular seizures about q 1-2 months). Patient does not seem to have recovered well from last seizure, recently seen in ED with elevated Depakote lvl 122, [facility] and advised to resume Depakote per Dr. Merri BrunetteNab (after 2 missed doses), re-check Depakote at 92.6 -Continue home medications: keppra, depakote, topamax, banzel (mother brought topamax / banzel from home to be admin per pharm) -Plan to contact Peds Neuro (Dr. Sharene SkeansHickling) in AM for  recommendations -seizure precautions  #Reflux -continue Protonix  FEN/GI: NPO; IVF 15425mL/hr x 1-2 days Prophylaxis: Subq Hep  Disposition: Admit to floor under Dr. Deirdre Priesthambliss; pending further improvement.   History of Present Illness: Michele [name] is a 28 y.o. female presenting with presenting with new-onset fatigue and weakness. PMH is significant for CP, MR, hypothyroidism, seizures, and reflux.   Level 5 caveat so history is from mother and step sister.   Mother states that she brought patient to the ED due patient having recent cough, fatigue, anoxeria, and weakness. This started about a weak ago after her last seizure. Patient was seen recently in ED(10/11) and had an essentially negative workup. Patient's Depakote levels were high at that time. Mom has been compliant with new medication instructions and says she has been giving the patient her 4 seizure medications as presceribed. Patient continues to be lethargic at home with decreased urine output and decreased PO. Patient has not had any recent seizures since her last episode last week. Patient has not been febrile at home. Mom states patient appears about the same as she did the last time she presented ED. Mother has also noticed that patient seems weaker. She is able to ambulate some at home but recently can't even stand. She also noticed patient looks "puffy" and that she has prn Lasix when necessary but has not been given in the last 2 days. Denies nausea, vomiting, and fevers. Mother also noticed the rash around patient's mouth and was prescribed clotrimazole but says it does not seem to be improving. Denies abdominal pain, chest pain, fever, headaches, nausea, neck pain, rash, sore throat or vomiting. Nothing aggravates the symptoms.  She has tried nothing for the symptoms.    Review Of Systems: Per HPI. Otherwise 12 point review of systems was performed and was unremarkable.  Patient Active Problem List   Diagnosis Date Noted  .  Lethargy 04/02/2014  . Laceration re-check 02/05/2014  . 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 Long-term use of high-risk medication 12/24/2012  . Morbid obesity 12/24/2012  . Congenital quadriplegia 12/24/2012  . Leg swelling 03/10/2011  . URINARY INCONTINENCE 02/25/2010  . 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 HYPOTHYROIDISM 01/29/2009  . MENTAL RETARDATION, PROFOUND 01/29/2009  . Infantile cerebral palsy 01/29/2009  . GASTROESOPHAGEAL REFLUX DISEASE 01/29/2009  . OSTEOPOROSIS 01/29/2009  . Seizure disorder 01/29/2009   Past Medical History: Past Medical History  Diagnosis Date  . Cerebral palsy   . Lennox-Gastaut syndrome   . Profound mental retardation   . Osteoporosis   . Rickets   . Hypothyroidism   . Seizures   . Reflux    Past Surgical History: Past Surgical History  Procedure Laterality Date  . Peg tube placement    . Gastrocutaneous fistual repair  2012   Social History: History  Substance Use Topics  . Smoking status: Never Smoker   . Smokeless tobacco: Never Used  . Alcohol Use: No   Please also refer to relevant sections of EMR.  Family History: Family History  Problem Relation Age of Onset  . Diabetes      MGGM  . Heart disease      MGGF  . Kidney disease      Maternal Great Aunt   . Colon cancer Neg Hx   . Hypertension Mother   . Cancer Maternal Aunt     lung  . Heart disease Maternal Grandmother     Died at 64  . Stroke Maternal Grandfather     died at 79  . Hypertension Paternal Grandmother    Allergies and Medications: Allergies  Allergen Reactions  . Carbamazepine Rash   No current facility-administered medications on file prior to encounter.   Current Outpatient Prescriptions on File Prior to Encounter  Medication Sig Dispense Refill  . alendronate (FOSAMAX) 70 MG tablet Take 70 mg by mouth once a week. Take on Saturdays with a full glass of  water on an empty stomach.      . Calcium Carbonate-Vitamin D (CALCARB 600/D) 600-400 MG-UNIT per tablet Take 1 tablet by mouth 2 (two) times daily. 1 tab per tube 2 time a day      . CARNITOR 1 GM/10ML solution Gi6578SwazNor Lea District HosWesley Woodlawn6578SwazWest Florida Surgery CentePromise [facility] Ro6578SwazThe Children'S CSt Joseph Medic6578SwazReynolds Road Surgical CenteSt Landry Extended Care6578SwazSurgery Centre Of Sw FloridCrestwood Solano Psychiatric Health6578SwazUniversity Of [facility] Hospital CeDakota Surgery And Laser C6578SwazCec Surgical ServiceVa Central Iowa Healthca6578SwazSoutheast Colorado HosBrown Cty Community Treatme6578SwazMiami ACleveland-Wade Park Va Medic6578SwazEncompass Health Rehabilitation Hospital OfSt. Luke'S Medic6578SwazSpringfield HosSlad6578SwazOregon State Hospital- Willow Spring6578SwazGeneral HospitalWilson N Jones [facility] - Behavioral Health6578SwazSheridan Surgical CenteLexington Medical C6578SwazGreater Baltimore Medical CHealthmark Regional Medic6578SwazMercy Hospital And Medical CKaiser Foundation Hospital - 6578SwazJackson Hospital And CBerkshire Cosmetic And Reconstructive Surgery 6578SwazCgs Endoscopy CenterNew York Gi C6578SwazMichigan Surgical CenteLos Robles Hospital & Medic6578SwazSan Joaquin Valley Rehabilitation HosGeisinger Gastroenterology And Endo6578SwazMayo Clinic Hospital Rochester St Mary'S CSt. Vincent Rehabilitation6578SwazMercy San Juan HosFulton State6578SwazSatanta District HosCook Children'S Medic6578SwazRegency Hospital Of ClevelandSutter Tracy Community Hospitall SpeakSlots per day  510 mL  5  . folic acid (FOLVITE) 1 MG tablet Take 1 mg by mouth every morning. 1 tab per tube daily      . furosemide (LASIX) 20 MG tablet Take 20-40 mg by mouth every other day.       . lactulose (CHRONULAC) 10 GM/15ML solution Take 30 mLs (20 g total) by mouth 2 (two) times daily.  240 mL  11  . ondansetron (ZOFRAN) 4 MG/5ML solution Take 5 mLs (4 mg total) by mouth every 8 (eight) hours as needed for nausea or vomiting.  50 mL  1  . pantoprazole sodium (PROTONIX) 40 mg/20 mL PACK Place 20 mg into feeding tube daily.      . Rufinamide (BANZEL) 40 MG/ML SUSP Take 5 mLs by mouth 2 (two) times daily.        Objective: BP  97/68  Pulse 66  Temp(Src) 97.4 F (36.3 C) (Oral)  Resp 22  SpO2 93%  LMP 02/17/2014 Exam: General: well-developed, NAD, AA female lying in bed, intellectually disabled, not interactive, sleeping, obese Head: normocephalic and atraumatic.  Mouth: Angular chelosis with dry MM Neck: supple, no JVD. No deformities, masses, or tenderness noted.  Lungs: CTAB, normal respiratory effort, no accessory muscle use, no crackles, and no wheezes.  Heart: RRR, no M/R/G.  Abdomen: Bowel sounds normal; abdomen soft and nontender.  Pulses: DP/PT are present but faint.  Extremities: Edema present Neurologic: No focal deficits, exam limited due to patient being hard to arouse. She exhibits normal muscle tone Skin: Intact without suspicious lesions or rashes. Warm. Dry skin with scaling.  Labs and Imaging: CBC BMET   Recent Labs Lab 04/02/14 1023  WBC 7.0  HGB 11.9*  HCT 37.7  PLT 124*    Recent Labs Lab 04/02/14 1023  NA 143  K 3.9  CL 104  CO2 28  BUN 15  CREATININE 0.79  GLUCOSE 80  CALCIUM 10.1     Dg Chest 2  View  04/02/2014   CLINICAL DATA:  Shortness of breath and weakness. History of cerebral palsy.  EXAM: CHEST  2 VIEW  COMPARISON:  03/29/2014  FINDINGS: Two views of the chest demonstrate very low lung volumes. Heart size is grossly normal based on the low inspiratory effort. No evidence for pleural effusions on the lateral view.  IMPRESSION: Low lung volumes without focal disease.   Electronically Signed   By: Richarda Overlie M.D.   On: 04/02/2014 11:39   Ct Head Wo Contrast  04/02/2014   CLINICAL DATA:  Seizure, fell down, hit face, lethargy  EXAM: CT HEAD WITHOUT CONTRAST  TECHNIQUE: Contiguous axial images were obtained from the base of the skull through the vertex without intravenous contrast.  COMPARISON:  03/29/2014  FINDINGS: No skull fracture is noted. No intracranial hemorrhage, mass effect or midline shift. The gray and white-matter differentiation is preserved. No hydrocephalus. No acute cortical infarction. No mass lesion is noted on this unenhanced scan.  IMPRESSION: No acute intracranial abnormality.  No significant change.   Electronically Signed   By: Natasha Mead M.D.   On: 04/02/2014 12:19    Pincus Large, DO 04/02/2014, 4:07 PM PGY-1, [place] [facility] FPTS Intern pager: [phone], text pages welcome  Upper Level Addendum:  I have seen and evaluated this patient along with Dr. Doroteo Glassman and reviewed the above note, making necessary revisions in purple.

## 2014-04-02 NOTE — ED Notes (Signed)
CALLED [facility] AND THEY WILL TRANSPORT PT TO HER ROOM ON 6700 WHEN HER PROCEDURE IS COMPLETE

## 2014-04-03 ENCOUNTER — Inpatient Hospital Stay (HOSPITAL_COMMUNITY): Payer: Medicaid Other

## 2014-04-03 ENCOUNTER — Encounter (HOSPITAL_COMMUNITY): Payer: Self-pay | Admitting: General Practice

## 2014-04-03 ENCOUNTER — Other Ambulatory Visit: Payer: Self-pay | Admitting: Family Medicine

## 2014-04-03 DIAGNOSIS — R5383 Other fatigue: Secondary | ICD-10-CM

## 2014-04-03 DIAGNOSIS — E860 Dehydration: Secondary | ICD-10-CM

## 2014-04-03 DIAGNOSIS — G40311 Generalized idiopathic epilepsy and epileptic syndromes, intractable, with status epilepticus: Secondary | ICD-10-CM

## 2014-04-03 DIAGNOSIS — G809 Cerebral palsy, unspecified: Secondary | ICD-10-CM

## 2014-04-03 DIAGNOSIS — G808 Other cerebral palsy: Secondary | ICD-10-CM

## 2014-04-03 LAB — BASIC METABOLIC PANEL
ANION GAP: 7 (ref 5–15)
BUN: 16 mg/dL (ref 6–23)
CALCIUM: 9 mg/dL (ref 8.4–10.5)
CHLORIDE: 110 meq/L (ref 96–112)
CO2: 29 meq/L (ref 19–32)
Creatinine, Ser: 0.76 mg/dL (ref 0.50–1.10)
GFR calc non Af Amer: >90 mL/min (ref >90)
Glucose, Bld: 66 mg/dL — ABNORMAL LOW (ref 70–99)
Potassium: 4 mEq/L (ref 3.7–5.3)
SODIUM: 146 meq/L (ref 137–147)

## 2014-04-03 LAB — CBC
HCT: 31.4 % — ABNORMAL LOW (ref 36.0–46.0)
Hemoglobin: 10 g/dL — ABNORMAL LOW (ref 12.0–15.0)
MCH: 35 pg — ABNORMAL HIGH (ref 26.0–34.0)
MCHC: 31.8 g/dL (ref 30.0–36.0)
MCV: 109.8 fL — ABNORMAL HIGH (ref 78.0–100.0)
Platelets: 110 10*3/uL — ABNORMAL LOW (ref 150–400)
RBC: 2.86 MIL/uL — ABNORMAL LOW (ref 3.87–5.11)
RDW: 21.3 % — ABNORMAL HIGH (ref 11.5–15.5)
WBC: 5.9 10*3/uL (ref 4.0–10.5)

## 2014-04-03 LAB — VITAMIN B12: VITAMIN B 12: 611 pg/mL (ref 211–911)

## 2014-04-03 LAB — AMMONIA: AMMONIA: 64 umol/L — AB (ref 11–60)

## 2014-04-03 MED ORDER — INFLUENZA VAC SPLIT QUAD 0.5 ML IM SUSY
0.5000 mL | PREFILLED_SYRINGE | INTRAMUSCULAR | Status: AC
  Administered 2014-04-04: 0.5 mL via INTRAMUSCULAR
  Filled 2014-04-03: qty 0.5

## 2014-04-03 MED ORDER — SODIUM CHLORIDE 0.9 % IV SOLN
500.0000 mg | Freq: Four times a day (QID) | INTRAVENOUS | Status: DC
  Filled 2014-04-03 (×2): qty 500

## 2014-04-03 MED ORDER — DIPHENHYDRAMINE HCL 25 MG PO CAPS
25.0000 mg | ORAL_CAPSULE | Freq: Four times a day (QID) | ORAL | Status: DC | PRN
  Administered 2014-04-06: 25 mg via ORAL
  Filled 2014-04-03: qty 1

## 2014-04-03 MED ORDER — SODIUM CHLORIDE 0.9 % IV SOLN
1.0000 g | Freq: Four times a day (QID) | INTRAVENOUS | Status: DC
  Administered 2014-04-03 – 2014-04-05 (×8): 1 g via INTRAVENOUS
  Filled 2014-04-03 (×10): qty 1000

## 2014-04-03 NOTE — Progress Notes (Signed)
Family Medicine Teaching Service Daily Progress Note Intern Pager: [phone]  Patient name: Michele [name] Medical record number: [id number] Date of birth: 15-Apr-1986 Age: 28 y.o. Gender: female  Primary Care Provider: Gildardo CrankerHess, Bryan, DO Consultants: None Code Status: Full  Pt Overview and Major Events to Date:  10/15: admitted for AMS/weakness 2/2 suspected dehydration  Assessment and Plan: Michele PineRobin C Belongia is a 28 y.o. female presenting with new-onset fatigue and weakness. PMH is significant for CP, MR, seizures, and reflux.   #Fatigue/AMS with associated weakness, presumed dehydration - New-onset x 1 week. Suspected dehydration due to poor PO intake with decreased UOP, clinical dry MM on exam. Possible viral URI and less likely PNA with negative CXR, unlikely meningitis. Patient has had some recent URI symptoms, suspect likely viral etiology. Patient usually able to ambulate some at home. Oral swelling and discomfort likely cause of decreased PO intake.  - CMP: benign - 10/16 CBC: WBC 5.9, Hgb 10.0, plat 110 - Ammonia pending - PT/OT consulted  - EKG NSR without acute ST-T wave changes, no prolong QT  - repeat CXR w/o PNA concern (very difficult read); low threshold to repeat w/ any fever dev. - prn zofran  - continue IVF rehydration and resume PO as tolerated  - Failed LP and LP under fluoro in ED -- suspicion low for meningitis so will not continue unless sxs change  #UTI - Culture + enterococcus - continue PO encouragement and IVF - Ampicillin, start 10/16  #Seizures - Hx of difficult to manage seizures, followed closely by Peds Neuro (Dr. Sharene SkeansHickling). Last seizure was 1 week ago (hx of regular seizures about q 1-2 months).  -Continue home medications: keppra, depakote, topamax, banzel (mother brought topamax / banzel from home to be admin per pharm)  -Dr. Sharene SkeansHickling contacted. Agrees w/ current plan. Welcomes f/u appointment in 1 month assuming no unforeseen setbacks. -seizure precautions    # Macrocytic Anemia: Hgb 10.0; MCV 109.8 - on folic acid - Vit B12 - pending - will monitor  #Reflux  -continue Protonix   FEN/GI: full diet; IVF 13225mL/hr x 1-2 days   Prophylaxis: Subq Hep   Disposition: Pending medical stabilization; Has discussed w/ pt's pediatric neurologist -- they would be happy to see back in their clinic in one month (sooner if neurological status does not stabilize). We greatly appreciate the advice.  Subjective:  Patient laying in bed w/ sheets over head. Family at bedside. Mother states she sleeps like this at home. Patient did not communicate w/ provider. She was awake. Appeared uncomfortable. Mother stated that pt looks better than at admission. Patient voided once this AM. Mother showed soiled mat which had obvious dark yellow urine stains. Mother worried about inflammation around pts lips.   Objective: Pulse Rate:  [64-95] 95 (10/16 1017) Resp:  [13-22] 20 (10/16 1017) BP: (87-131)/(67-98) 131/98 mmHg (10/16 1017) SpO2:  [93 %-100 %] 100 % (10/16 1017) Weight:  [175 lb (79.379 kg)] 175 lb (79.379 kg) (10/15 2016) Physical Exam: General: well-developed, NAD, AA female lying in bed, intellectually disabled, not interactive, awake, obese Head: normocephalic and atraumatic.  Mouth: Angular chelosis with dry MM Neck: supple, no JVD. No deformities, masses, or tenderness noted.  Lungs: CTAB, normal respiratory effort, no accessory muscle use, no crackles, and no wheezes.  Heart: RRR, no M/R/G.  Abdomen: Bowel sounds normal; abdomen soft and nontender.  Pulses: DP/PT are present but faint.  Extremities: Edema present Neurologic: No focal deficits, exam limited due to patient being hard to  arouse. She exhibits normal muscle tone  Skin: Intact without suspicious lesions or rashes. Warm. Dry skin with scaling.   Laboratory:  Recent Labs Lab 03/29/14 1103 04/02/14 1023 04/03/14 0500  WBC 7.1 7.0 5.9  HGB 11.6* 11.9* 10.0*  HCT 35.2* 37.7 31.4*   PLT 122* 124* 110*    Recent Labs Lab 03/29/14 1103 04/02/14 1023 04/03/14 0500  NA 142 143 146  K 3.7 3.9 4.0  CL 103 104 110  CO2 28 28 29   BUN 18 15 16   CREATININE 0.91 0.79 0.76  CALCIUM 9.1 10.1 9.0  PROT 7.1 7.8  --   BILITOT 0.3 0.3  --   ALKPHOS 68 89  --   ALT 29 34  --   AST 58* 44*  --   GLUCOSE 63* 80 66*     Imaging/Diagnostic Tests: CXR 10/15 IMPRESSION:  Low lung volumes without focal disease.  CT Head 10/15 IMPRESSION:  No acute intracranial abnormality. No significant change.  CXR 10/16 IMPRESSION:  Suboptimal aeration. No focal infiltrate or effusion.   Kathee DeltonIan D McKeag, MD 04/03/2014, 12:46 PM PGY-1, [place] [facility] FPTS Intern pager: [phone], text pages welcome

## 2014-04-03 NOTE — H&P (Addendum)
Family Medicine Teaching Service Attending Note  I interviewed and examined patient Michele [name] and reviewed their tests and x-rays.  I discussed with Dr. Kirtland BouchardK and reviewed their note for today.  I agree with their assessment and plan.     Additionally  This AM Awakens to voice, resists exam, moves all extrem equally MM and conjunctiva no lesions Neck appears supple no mening signs Accroding to Mom she is more interactive but not at baseline  She will try to walk her later   Lethargy No signs of infection would not treat with antibiotics No metabolic abnl - Consider TSH, ammonia and UDS if persists Unsure why had attempted LP Monitor response to fluid  Discuss with neurology if is not at baseline later today

## 2014-04-03 NOTE — Progress Notes (Signed)
FMTS Attending Daily Note:  Jeff Veva Grimley MD  [phone] pager  Family Practice pager:  [phone] I have discussed this patient with the resident Dr. McKeag and attending physician Dr. Chambliss.  I agree with their findings, assessment, and care plan  

## 2014-04-04 LAB — VALPROIC ACID LEVEL: VALPROIC ACID LVL: 108.2 ug/mL — AB (ref 50.0–100.0)

## 2014-04-04 LAB — CBC
HCT: 33.6 % — ABNORMAL LOW (ref 36.0–46.0)
HEMOGLOBIN: 10.6 g/dL — AB (ref 12.0–15.0)
MCH: 35 pg — AB (ref 26.0–34.0)
MCHC: 31.5 g/dL (ref 30.0–36.0)
MCV: 110.9 fL — ABNORMAL HIGH (ref 78.0–100.0)
Platelets: 120 10*3/uL — ABNORMAL LOW (ref 150–400)
RBC: 3.03 MIL/uL — ABNORMAL LOW (ref 3.87–5.11)
RDW: 21.6 % — ABNORMAL HIGH (ref 11.5–15.5)
WBC: 6.3 10*3/uL (ref 4.0–10.5)

## 2014-04-04 LAB — URINE CULTURE: Colony Count: >=100000

## 2014-04-04 MED ORDER — CLOTRIMAZOLE 1 % EX CREA
1.0000 "application " | TOPICAL_CREAM | Freq: Two times a day (BID) | CUTANEOUS | Status: DC
  Administered 2014-04-04 – 2014-04-07 (×7): 1 via TOPICAL
  Filled 2014-04-04: qty 15

## 2014-04-04 MED ORDER — LACTULOSE 10 GM/15ML PO SOLN
20.0000 g | Freq: Two times a day (BID) | ORAL | Status: DC
  Administered 2014-04-04 – 2014-04-06 (×5): 20 g via ORAL
  Filled 2014-04-04 (×9): qty 30

## 2014-04-04 MED ORDER — SODIUM CHLORIDE 0.45 % IV SOLN
INTRAVENOUS | Status: DC
  Administered 2014-04-04: 11:00:00 via INTRAVENOUS

## 2014-04-04 NOTE — Progress Notes (Signed)
Seen and examined.  Agree with the documentation and management of Dr. Caleb PoppNettey.  Improving but not yet at baseline.  Continue current rx.

## 2014-04-04 NOTE — Evaluation (Signed)
Physical Therapy Evaluation Patient Details Name: Michele PineRobin C [name] MRN: [id number] DOB: 21-Apr-1986 Today's Date: 04/04/2014   History of Present Illness  Pt adm with AMS, weakness, UTI, dehydration. Pt with cerebral palsy and mental retardation. Also with difficult to control seizure disorder.  Clinical Impression  Pt admitted with above. Pt currently with functional limitations due to the deficits listed below (see PT Problem List).  Pt will benefit from skilled PT to increase their independence and safety with mobility to allow discharge to the venue listed below. Pt not back to baseline at this time. Weak but mother is confident she will bounce back and that they will be able to manage her at home. Will continue to follow acutely. The pt has a transport chair that is >27 years old and is missing the footrest. Recommend a new transport chair.      Follow Up Recommendations No PT follow up    Equipment Recommendations  Other (comment) (Needs new transport chair.)    Recommendations for Other Services       Precautions / Restrictions Precautions Precautions: Fall      Mobility  Bed Mobility Overal bed mobility: Needs Assistance Bed Mobility: Supine to Sit;Sit to Supine     Supine to sit: +2 for physical assistance;Max assist Sit to supine: +2 for physical assistance;Mod assist   General bed mobility comments: Assist to bring trunk up into sitting and legs off of bed. Assist to bring legs back up into bed.  Transfers                    Ambulation/Gait                Stairs            Wheelchair Mobility    Modified Rankin (Stroke Patients Only)       Balance Overall balance assessment: Needs assistance Sitting-balance support: No upper extremity supported;Feet unsupported Sitting balance-Leahy Scale: Poor Sitting balance - Comments: Pt sat EOB x 10 minutes with min A. Briefly sat with supervision. Postural control: Right lateral lean;Posterior  lean                                   Pertinent Vitals/Pain Pain Assessment: Faces Faces Pain Scale: No hurt    Home Living Family/patient expects to be discharged to:: Private residence Living Arrangements: Parent Available Help at Discharge: Family;Available 24 hours/day Type of Home: House Home Access: Ramped entrance     Home Layout: One level Home Equipment: Walker - 4 wheels;Other (comment) (transport chair)      Prior Function Level of Independence: Needs assistance   Gait / Transfers Assistance Needed: Pt usually able to amb with rollator without assist.           Hand Dominance        Extremity/Trunk Assessment   Upper Extremity Assessment: Defer to OT evaluation           Lower Extremity Assessment: Generalized weakness         Communication   Communication: Other (comment) (Pt nonverbal)  Cognition Arousal/Alertness: Awake/alert   Overall Cognitive Status: Impaired/Different from baseline                 General Comments: Mother reports pt isn't back to baseline yet.    General Comments      Exercises        Assessment/Plan  PT Assessment Patient needs continued PT services  PT Diagnosis Difficulty walking;Generalized weakness   PT Problem List Decreased strength;Decreased activity tolerance;Decreased balance;Decreased mobility  PT Treatment Interventions DME instruction;Gait training;Functional mobility training;Therapeutic activities;Therapeutic exercise;Balance training;Patient/family education   PT Goals (Current goals can be found in the Care Plan section) Acute Rehab PT Goals Patient Stated Goal: Mother - to return home and return to baseline. PT Goal Formulation: With family Time For Goal Achievement: 04/11/14 Potential to Achieve Goals: Good    Frequency Min 3X/week   Barriers to discharge        Co-evaluation               End of Session   Activity Tolerance: Patient limited by  fatigue Patient left: in bed;with call bell/phone within reach;with bed alarm set;with family/visitor present Nurse Communication: Mobility status (nurse tech)         Time: [id number] PT Time Calculation (min): 24 min   Charges:   PT Evaluation $Initial PT Evaluation Tier I: 1 Procedure PT Treatments $Therapeutic Activity: 8-22 mins   PT G Codes:          Bobbe Quilter 04/04/2014, 4:39 PM  [name] Shaub PT [phone]

## 2014-04-04 NOTE — Progress Notes (Signed)
Family Medicine Teaching Service Daily Progress Note Intern Pager: [phone]  Patient name: Michele [name] Medical record number: [id number] Date of birth: 1985/11/28 Age: 28 y.o. Gender: female  Primary Care Provider: Gildardo CrankerHess, Bryan, DO Consultants: None Code Status: Full  Pt Overview and Major Events to Date:  10/15: admitted for AMS/weakness 2/2 suspected dehydration  Assessment and Plan: Michele [name] is a 28 y.o. female presenting with new-onset fatigue and weakness. PMH is significant for CP, MR, seizures, and reflux.   #Fatigue/AMS with associated weakness, presumed dehydration - improved per mom's report. PO intake still not at baseline. Ammonia slightly above upper limit of normal at 64. Possibly secondary to concurrent UTI - follow-up PT/OT - low threshold to repeat CXR w/ any fever - prn zofran  - continue IVF rehydration and resume PO as tolerated  - Failed LP and LP under fluoro in ED -- suspicion low for meningitis so will not continue unless sxs change  #UTI - Culture + enterococcus - continue PO encouragement and IVF - Ampicillin, start 10/16>>> - plan to switch to oral abx when sensitivities are available  #Seizures - Hx of difficult to manage seizures, followed closely by Peds Neuro (Dr. Sharene SkeansHickling). Last seizure was 1 week ago (hx of regular seizures about q 1-2 months).  -Continue home medications: keppra, depakote, topamax, banzel (mother brought topamax / banzel from home to be admin per pharm)  -Dr. Sharene SkeansHickling contacted. Agrees w/ current plan. Welcomes f/u appointment in 1 month assuming no unforeseen setbacks. -seizure precautions  # Macrocytic Anemia: Hgb 10.0; MCV 109.8. on folic acid. Vit B12 wnl. Hemoglobin stbale - will monitor  # Reflux  -continue Protonix   # Rash: mom states this started after a dentist visit. She has used clotrimazole with some improvement. Overall it is looking better than before. - restart clotrimazole  # Constipation: patient on  lactulose at home. Not continued on admission - restart lactulose  FEN/GI: Regular diet; IVF 1/2 NS @100mL /hr   Prophylaxis: Subq Hep   Disposition: Discharge pending improvement in AMS and sensitivities for UTI  Subjective:  Mother states that the patient is feeling better but is still not POing as well as she usually does. Has not had a bowel movement in last two days which is not normal. She usually takes lactulose daily. Her rash on her mouth has improved.  Objective: Temp:  [97.5 F (36.4 C)-97.7 F (36.5 C)] 97.7 F (36.5 C) (10/17 0536) Pulse Rate:  [79-95] 79 (10/17 0536) Resp:  [16-20] 16 (10/17 0536) BP: (90-131)/(52-98) 110/66 mmHg (10/17 0536) SpO2:  [97 %-100 %] 100 % (10/17 0536) Weight:  [174 lb 2.6 oz (79 kg)] 174 lb 2.6 oz (79 kg) (10/16 2153)  Physical Exam: General: well-developed, NAD, AA female lying in bed, awake, obese Head: normocephalic and atraumatic.  Mouth: Angular cheilitis with dry MM, swollen lips (apparently at baseline per mom) Neck: supple, no JVD. No deformities, masses, or tenderness noted.  Lungs: CTAB, normal respiratory effort, no accessory muscle use, no crackles, and no wheezes.  Heart: RRR, no M/R/G.  Abdomen: Bowel sounds normal; abdomen soft and nontender.  Pulses: 1+ DP/PT  Extremities: Edema present bilaterally, with slightly worsened edema on left Neurologic: Patient alert but not able to assess orientation. She is non-verbal although she interacts when I try to examine Skin: Intact. Warm. Dry skin with scaling.   Laboratory:  Recent Labs Lab 04/02/14 1023 04/03/14 0500 04/04/14 0845  WBC 7.0 5.9 6.3  HGB 11.9* 10.0* 10.6*  HCT 37.7 31.4* 33.6*  PLT 124* 110* 120*    Recent Labs Lab 03/29/14 1103 04/02/14 1023 04/03/14 0500  NA 142 143 146  K 3.7 3.9 4.0  CL 103 104 110  CO2 28 28 29   BUN 18 15 16   CREATININE 0.91 0.79 0.76  CALCIUM 9.1 10.1 9.0  PROT 7.1 7.8  --   BILITOT 0.3 0.3  --   ALKPHOS 68 89  --    ALT 29 34  --   AST 58* 44*  --   GLUCOSE 63* 80 66*     Imaging/Diagnostic Tests: CXR 10/15 IMPRESSION:  Low lung volumes without focal disease.  CT Head 10/15 IMPRESSION:  No acute intracranial abnormality. No significant change.  CXR 10/16 IMPRESSION:  Suboptimal aeration. No focal infiltrate or effusion.   Jacquelin Hawkingalph Broderick Fonseca, MD 04/04/2014, 9:52 AM PGY-2 [place] Family Medicine FPTS Intern pager: [phone], text pages welcome

## 2014-04-05 DIAGNOSIS — N3000 Acute cystitis without hematuria: Secondary | ICD-10-CM

## 2014-04-05 MED ORDER — SODIUM CHLORIDE 0.9 % IJ SOLN: 3.0000 mL | INTRAMUSCULAR | Status: DC | PRN

## 2014-04-05 MED ORDER — SODIUM CHLORIDE 0.9 % IJ SOLN
3.0000 mL | Freq: Two times a day (BID) | INTRAMUSCULAR | Status: DC
  Administered 2014-04-05 – 2014-04-06 (×4): 3 mL via INTRAVENOUS

## 2014-04-05 MED ORDER — SODIUM CHLORIDE 0.9 % IV SOLN: 250.0000 mL | INTRAVENOUS | Status: DC | PRN

## 2014-04-05 MED ORDER — AMOXICILLIN 500 MG PO CAPS
500.0000 mg | ORAL_CAPSULE | Freq: Three times a day (TID) | ORAL | Status: DC
  Administered 2014-04-05 – 2014-04-07 (×7): 500 mg via ORAL
  Filled 2014-04-05 (×9): qty 1

## 2014-04-05 NOTE — Discharge Summary (Signed)
Family Medicine Teaching [facility] Discharge Summary  Patient name: Michele [name] Medical record number: [id number] Date of birth: Mar 04, 1986 Age: 28 y.o. Gender: female Date of Admission: 04/02/2014  Date of Discharge: 04/07/2014  Admitting Physician: Tobey Grim, MD  Primary Care Provider: Gildardo Cranker, DO Consultants: none  Indication for Hospitalization:  AMS Weakness  Discharge Diagnoses/Problem List:  Fatigue/AMS with associated weakness secondary to dehydration UTI Microcytic anemia Constipation  Disposition: Home with family  Discharge Condition: stable  Discharge Exam:  General: well-developed, NAD, AA female lying in bed, awake, obese Head: normocephalic and atraumatic.  Mouth: Angular cheilitis with dry MM Neck: supple, no JVD Lungs: CTAB, normal respiratory effort, no accessory muscle use, no crackles, and no wheezes.  Heart: RRR, no M/R/G.  Abdomen: Bowel sounds normal; abdomen soft and nontender.  Pulses: 1+ DP/PT  Extremities: Edema present bilaterally Neurologic: Patient alert but not able to assess orientation. She is non-verbal    Brief Hospital Course:  Michele [name] is a 28 y.o. female presenting who presented with new-onset fatigue and weakness. PMH is significant for CP, MR, hypothyroidism, seizures, and reflux.  Patient was brought to the ED do to experiencing recent cough fatigue anorexia and weakness. According to her mother the symptoms had begun approximately one week ago and began after her most recent seizure. Prior to admission she had actually been seen recently in the ED on 10/11 for similar symptoms and had a negative workup. According to her mother she is typically able to ambulate at home however had grown too weak to do so leading up to admission. Patient had not been experiencing any nausea vomiting or fever. Mother had also noticed a rash around patient's mouth. Patient has had significantly diminished by mouth intake over the  past week. On admission she appeared very dry and sluggish.  In the ED labs were drawn which showed no leukocytosis or lactic acidosis. A LP was attempted but was unsuccessful and was later held because of low suspicion of meningitis. UA showed evidence of possible UTI, culture was obtained and grew enterococcus. EKG was obtained which showed normal sinus rhythm without any acute ST changes and no prolonged QT. Patient was given IV fluids for replacement and admitted to inpatient care.  Patient was continued on her regular seizure home medical management. She was started on ampicillin on 10/16 for the UTI secondary to enterococcus. On CBC patient was noted to have macrocytic anemia with an MCV of 109.8. Patient is on regular folic acid at home, so a vitamin B12 value was obtained. Vitamin B12 was within normal limits. Ammonia level was obtained due to altered mental status on admission. This was found to be elevated at 64. Patient began to respond well to fluid replacement therapy, mental status began to gradually normalize towards baseline according to her mother. Urine output also began to improve.  Although patient is beginning to show signs of improvement she did not have a bowel movement for multiple days. According to her mother this was abnormal. Patient was restarted on her home lactulose. Multiple bowel movements were experienced morning of 10/20, patient was ambulated that day as well.  Patient's neurologist Dr. Sharene Skeans was contacted to inform him of his patient's admission to inpatient care. Dr. Sharene Skeans stated his agreement with our current plan at the time of our discussion. And stated he was wanting to see patient for a followup appointment in approximately one month assuming no unforeseen neurologic setbacks were experienced.  Patient was discharged  w/ a prescription for 4 additional days of amoxicillin to treat her UTI.   Issues for Follow Up:  - follow-up with Dr.  Sharene SkeansHickling  Significant Procedures: none  Significant Labs and Imaging:   Recent Labs Lab 04/02/14 1023 04/03/14 0500 04/04/14 0845  WBC 7.0 5.9 6.3  HGB 11.9* 10.0* 10.6*  HCT 37.7 31.4* 33.6*  PLT 124* 110* 120*    Recent Labs Lab 04/02/14 1023 04/03/14 0500  NA 143 146  K 3.9 4.0  CL 104 110  CO2 28 29  GLUCOSE 80 66*  BUN 15 16  CREATININE 0.79 0.76  CALCIUM 10.1 9.0  ALKPHOS 89  --   AST 44*  --   ALT 34  --   ALBUMIN 3.4*  --    Vitamin B12 611 Ammonia 64 Lactic acid 0.8   Results/Tests Pending at Time of Discharge: None  Discharge Medications:    Medication List         alendronate 70 MG tablet  Commonly known as:  FOSAMAX  Take 70 mg by mouth once a week. Take on Saturdays with a full glass of water on an empty stomach.     amoxicillin 500 MG capsule  Commonly known as:  AMOXIL  Take 1 capsule (500 mg total) by mouth 2 (two) times daily.     BANZEL 40 MG/ML Susp  Generic drug:  Rufinamide  Take 200 mg by mouth 2 (two) times daily.     CALCARB 600/D 600-400 MG-UNIT per tablet  Generic drug:  Calcium Carbonate-Vitamin D  Take 1 tablet by mouth 2 (two) times daily. 1 tab per tube 2 time a day     CARNITOR 1 GM/10ML solution  Generic drug:  levOCARNitine  Give 5ml by mouth 3 times per day     clotrimazole 1 % cream  Commonly known as:  LOTRIMIN  Apply 1 application topically 2 (two) times daily.     divalproex 125 MG capsule  Commonly known as:  DEPAKOTE SPRINKLE  Take 1,000 mg by mouth 2 (two) times daily.     folic acid 1 MG tablet  Commonly known as:  FOLVITE  Take 1 mg by mouth every morning. 1 tab per tube daily     furosemide 20 MG tablet  Commonly known as:  LASIX  Take 20-40 mg by mouth every other day.     lactulose 10 GM/15ML solution  Commonly known as:  CHRONULAC  TAKE 30ML BY MOUTH TWICE DAILY     levETIRAcetam 100 MG/ML solution  Commonly known as:  KEPPRA  Take 1,200 mg by mouth 2 (two) times daily. 12 mls      ondansetron 4 MG/5ML solution  Commonly known as:  ZOFRAN  Take 5 mLs (4 mg total) by mouth every 8 (eight) hours as needed for nausea or vomiting.     PROTONIX 40 mg/20 mL Pack  Generic drug:  pantoprazole sodium  Place 20 mg into feeding tube daily.     TOPAMAX SPRINKLE 25 MG capsule  Generic drug:  topiramate  Take 150 mg by mouth 2 (two) times daily.        Discharge Instructions: Please refer to Patient Instructions section of EMR for full details.  Patient was counseled important signs and symptoms that should prompt return to medical care, changes in medications, dietary instructions, activity restrictions, and follow up appointments.   Follow-Up Appointments: Follow-up Information   Follow up with Gildardo CrankerHess, Bryan, DO. Schedule an appointment as soon as possible  for a visit in 1 week.   Specialty:  Family Medicine   Contact information:   [address] [place] [address] [phone]       Follow up with Deetta PerlaHICKLING,WILLIAM H, MD On 04/29/2014. (@ 2:30 PM)    Specialty:  Pediatrics   Contact information:   [address] [place] [address] [phone]       Kathee DeltonIan D McKeag, MD 04/07/2014, 12:18 PM PGY-1, [facility]

## 2014-04-05 NOTE — Progress Notes (Signed)
Family Medicine Teaching Service Daily Progress Note Intern Pager: [phone]  Patient name: Michele [name] Medical record number: [id number] Date of birth: 1986-01-02 Age: 28 y.o. Gender: female  Primary Care Provider: Gildardo CrankerHess, Yvette Loveless, DO Consultants: None Code Status: Full  Pt Overview and Major Events to Date:  10/15: admitted for AMS/weakness 2/2 suspected dehydration  Assessment and Plan: Michele PineRobin C Ryden is a 28 y.o. female presenting with new-onset fatigue and weakness. PMH is significant for CP, MR, seizures, and reflux.   #Fatigue/AMS with associated weakness, presumed dehydration - improved per mom's report. - No PT/OT f/u recommended - prn zofran  - D/C IVF, continue to monitor PO intake  - Failed LP and LP under fluoro in ED -- suspicion low for meningitis so will not continue unless sxs change  #UTI - Culture + enterococcus - continue PO encouragement and IVF - Ampicillin, start 10/16 >>>, sensitive to amoxicillin, switch today and f/u for d/c tomorrow   #Seizures - Hx of difficult to manage seizures, followed closely by Peds Neuro (Dr. Sharene SkeansHickling). Last seizure was 1 week ago (hx of regular seizures about q 1-2 months).  -Continue home medications: keppra, depakote, topamax, banzel (mother brought topamax / banzel from home to be admin per pharm)  -Dr. Sharene SkeansHickling contacted. Agrees w/ current plan. Welcomes f/u appointment in 1 month assuming no unforeseen setbacks. -seizure precautions  # Macrocytic Anemia: Hgb 10.0; MCV 109.8. on folic acid. Vit B12 wnl. Hemoglobin stbale - will monitor  # Reflux  -continue Protonix   # Rash: mom states this started after a dentist visit. She has used clotrimazole with some improvement. Overall it is looking better than before. - restart clotrimazole  # Constipation: patient on lactulose at home. Not continued on admission - restart lactulose  FEN/GI: Regular diet; SL IV  Prophylaxis: Subq Hep   Disposition: Discharge pending improvement  in AMS and sensitivities for UTI  Subjective:  Doing better, almost back to baseline   Objective: Temp:  [97.4 F (36.3 C)-97.7 F (36.5 C)] 97.6 F (36.4 C) (10/18 0556) Pulse Rate:  [78-88] 78 (10/18 0556) Resp:  [16-18] 16 (10/18 0556) BP: (86-101)/(53-61) 101/59 mmHg (10/18 0556) SpO2:  [96 %-100 %] 96 % (10/18 0556) Weight:  [174 lb 4.4 oz (79.05 kg)] 174 lb 4.4 oz (79.05 kg) (10/17 2103)  Physical Exam: General: well-developed, NAD, AA female lying in bed, awake, obese Head: normocephalic and atraumatic.  Mouth: Angular cheilitis with dry MM Neck: supple, no JVD Lungs: CTAB, normal respiratory effort, no accessory muscle use, no crackles, and no wheezes.  Heart: RRR, no M/R/G.  Abdomen: Bowel sounds normal; abdomen soft and nontender.  Pulses: 1+ DP/PT  Extremities: Edema present bilaterally Neurologic: Patient alert but not able to assess orientation. She is non-verbal    Laboratory:  Recent Labs Lab 04/02/14 1023 04/03/14 0500 04/04/14 0845  WBC 7.0 5.9 6.3  HGB 11.9* 10.0* 10.6*  HCT 37.7 31.4* 33.6*  PLT 124* 110* 120*    Recent Labs Lab 03/29/14 1103 04/02/14 1023 04/03/14 0500  NA 142 143 146  K 3.7 3.9 4.0  CL 103 104 110  CO2 28 28 29   BUN 18 15 16   CREATININE 0.91 0.79 0.76  CALCIUM 9.1 10.1 9.0  PROT 7.1 7.8  --   BILITOT 0.3 0.3  --   ALKPHOS 68 89  --   ALT 29 34  --   AST 58* 44*  --   GLUCOSE 63* 80 66*     Takeysha Bonk  R Addalyn Speedy, DO 04/05/2014, 9:31 AM PGY-3 [facility] FPTS Intern pager: [phone], text pages welcome

## 2014-04-05 NOTE — Progress Notes (Signed)
Seen and examined.  Agree with the documentation and management of Dr. Paulina FusiHess.  Enterococcus UTI.  Likely DC to home in am.  She is returning to her baseline.

## 2014-04-06 DIAGNOSIS — K219 Gastro-esophageal reflux disease without esophagitis: Secondary | ICD-10-CM

## 2014-04-06 MED ORDER — AMOXICILLIN 500 MG PO CAPS: 500.0000 mg | ORAL_CAPSULE | Freq: Two times a day (BID) | ORAL | Status: DC

## 2014-04-06 NOTE — Progress Notes (Signed)
Patient received flu vaccine yesterday 04/05/14. Large, round, red, and swollen area noted on her left upper arm where the vaccine was administered. MD at bedside. Will monitor.  Leanna BattlesEckelmann, Geordie Nooney Eileen, RN.

## 2014-04-06 NOTE — Progress Notes (Signed)
PT NOTE: Nurse requesting PT to attempt to ambulation due to pending d/c.  Pt had been seen earlier by a different PT and was too lethargic to ambulate. Upon arrival, pt up in recliner and opened eyes minimally and kept falling back asleep despite mother and therapist's attempts to get her to stay awake. Unable to ambulate and nursing informed. Michele [name]

## 2014-04-06 NOTE — Progress Notes (Signed)
Physical Therapy Treatment Patient Details Name: Michele PineRobin C [name] MRN: [id number] DOB: 07-01-1985 Today's Date: 04/06/2014    History of Present Illness Pt adm with AMS, weakness, UTI, dehydration. Pt with cerebral palsy and mental retardation. Also with difficult to control seizure disorder.    PT Comments    Pt sleeping when PT entered room and was difficult to arouse at first. Focus of session was seated balance activities in the recliner chair. Mother facilitated standing at edge of chair with therapist/tech blocking knees for support, however do not feel that pt would have been appropriate for gait training at this time due to overall lethargy. Will continue to follow and progress as able per POC.   Follow Up Recommendations  No PT follow up     Equipment Recommendations  Other (comment) (Transfer chair)    Recommendations for Other Services       Precautions / Restrictions Precautions Precautions: Fall Restrictions Weight Bearing Restrictions: No    Mobility  Bed Mobility Overal bed mobility: Needs Assistance Bed Mobility: Supine to Sit;Sit to Supine     Supine to sit: Mod assist     General bed mobility comments: Pt sitting in recliner upon PT arrival. Required max assist with bed pad to scoot to and from edge of chair.   Transfers Overall transfer level: Needs assistance Equipment used: 2 person hand held assist Transfers: Sit to/from Stand Sit to Stand: Mod assist Stand pivot transfers: +2 physical assistance;Total assist       General transfer comment: Pt stood (trunk flexed but hips clear of chair) with mother holding both hands and therapist/tech blocking knees. Held for ~15 seconds before requiring to sit.   Ambulation/Gait                 Stairs            Wheelchair Mobility    Modified Rankin (Stroke Patients Only)       Balance Overall balance assessment: Needs assistance Sitting-balance support: Feet supported;No upper  extremity supported Sitting balance-Leahy Scale: Poor Sitting balance - Comments: Pt sat unsupported at edge of chair for ~10 minutes with mod assist at times due to pt leaning anteriorly at times and laterally at times.   Standing balance support: During functional activity;Bilateral upper extremity supported Standing balance-Leahy Scale: Zero                      Cognition Arousal/Alertness: Awake/alert Behavior During Therapy: WFL for tasks assessed/performed Overall Cognitive Status: Impaired/Different from baseline Area of Impairment:  (family states she is improving; not quite at baseline)               General Comments: Mother reports pt isn't back to baseline yet.    Exercises General Exercises - Lower Extremity Long Arc Quad: 10 reps;PROM    General Comments General comments (skin integrity, edema, etc.): Pt overall is dependent with all adls.      Pertinent Vitals/Pain Pain Assessment: Faces Faces Pain Scale: No hurt    Home Living Family/patient expects to be discharged to:: Private residence Living Arrangements: Parent Available Help at Discharge: Family;Available 24 hours/day Type of Home: House Home Access: Ramped entrance   Home Layout: One level Home Equipment: Walker - 4 wheels;Transport chair      Prior Function Level of Independence: Needs assistance  Gait / Transfers Assistance Needed: Pt usually able to amb with rollator without assist. ADL's / Homemaking Assistance Needed: Pt overall per mother is fairly  dependent with all adls and homemaking.  Pt occasionally will feed self with spoon but not very often.  Pt dependent on depends for all toileting needs.  Pt's mother bathes and dresses her as well.  They do get her in the tub 1x each week and they do not have a bench.  Pt attempts to stand the whole time in shower while family holds her.  Pt would benefit greatly from a tub bench to avoid falls and allow her to continue to safely get in tub  1x per week.       PT Goals (current goals can now be found in the care plan section) Acute Rehab PT Goals Patient Stated Goal: Mother - to return home and return to baseline. PT Goal Formulation: With family Time For Goal Achievement: 04/11/14 Potential to Achieve Goals: Good Progress towards PT goals: Progressing toward goals    Frequency  Min 3X/week    PT Plan Current plan remains appropriate    Co-evaluation             End of Session   Activity Tolerance: Patient limited by lethargy Patient left: in chair;with call bell/phone within reach;with family/visitor present     Time: [id number] PT Time Calculation (min): 18 min  Charges:  $Neuromuscular Re-education: 8-22 mins                    G Codes:      Conni SlipperKirkman, [place]race Yoshida 04/06/2014, 12:28 PM  Conni SlipperLaura Vittoria Noreen, PT, DPT Acute Rehabilitation Services Pager: [phone]

## 2014-04-06 NOTE — Evaluation (Signed)
Occupational Therapy Evaluation and Discharge Summary Patient Details Name: Michele [name] MRN: [id number] DOB: 1985-11-11 Today's Date: 04/06/2014    History of Present Illness Pt adm with AMS, weakness, UTI, dehydration. Pt with cerebral palsy and mental retardation. Also with difficult to control seizure disorder.   Clinical Impression   Pt admitted for the above diagnosis.  After talking with mother, pt was dependent for most adls PTA.  Pt was able to walk with rollator at times on her good days with little assist but did not perform adls on her own.  Sometimes, pt could feed self with spoon. Talked with mother about working with pt to feed self and mother stated she was fine with doing that.  No acute OT needs identified.    Follow Up Recommendations  No OT follow up;Supervision/Assistance - 24 hour    Equipment Recommendations  Tub/shower bench    Recommendations for Other Services       Precautions / Restrictions Precautions Precautions: Fall Restrictions Weight Bearing Restrictions: No      Mobility Bed Mobility Overal bed mobility: Needs Assistance Bed Mobility: Supine to Sit;Sit to Supine     Supine to sit: Mod assist     General bed mobility comments: Pt was able to hold herself on EOB for short amounts of time with S.  Transfers Overall transfer level: Needs assistance Equipment used: 2 person hand held assist Transfers: Sit to/from [employer] CorporationStand;Stand Pivot Transfers Sit to Stand: Total assist;+2 physical assistance Stand pivot transfers: +2 physical assistance;Total assist       General transfer comment: Pt unable to stand w/o outside assist.  Pt did not attempt to stand on her own.  Total assist required.    Balance Overall balance assessment: Needs assistance Sitting-balance support: Feet supported;Bilateral upper extremity supported Sitting balance-Leahy Scale: Poor Sitting balance - Comments: Pt sat EOB for 10 min with S at times to min assist.    Standing balance support: During functional activity;Bilateral upper extremity supported Standing balance-Leahy Scale: Zero                              ADL Overall ADL's : Needs assistance/impaired Eating/Feeding: Maximal assistance;Sitting   Grooming: Total assistance   Upper Body Bathing: Total assistance   Lower Body Bathing: Total assistance   Upper Body Dressing : Total assistance   Lower Body Dressing: Total assistance   Toilet Transfer: +2 for physical assistance;Total assistance     Toileting - Clothing Manipulation Details (indicate cue type and reason): Pt uses depends at home and family cleans her in bed.     Functional mobility during ADLs: +2 for physical assistance General ADL Comments: Pt dependent for most if not all adls.  Family states she waxes and wanes at home but overall they do everything for her.     Vision                     Perception     Praxis      Pertinent Vitals/Pain Pain Assessment: Faces Faces Pain Scale: No hurt     Hand Dominance     Extremity/Trunk Assessment Upper Extremity Assessment Upper Extremity Assessment: Generalized weakness   Lower Extremity Assessment Lower Extremity Assessment: Defer to PT evaluation       Communication Communication Communication: Other (comment) (Pt nonverbal)   Cognition Arousal/Alertness: Awake/alert Behavior During Therapy: WFL for tasks assessed/performed Overall Cognitive Status: Impaired/Different from baseline Area of  Impairment:  (family states she is improving; not quite at baseline)               General Comments: Mother reports pt isn't back to baseline yet.   General Comments       Exercises       Shoulder Instructions      Home Living Family/patient expects to be discharged to:: Private residence Living Arrangements: Parent Available Help at Discharge: Family;Available 24 hours/day Type of Home: House Home Access: Ramped entrance      Home Layout: One level     Bathroom Shower/Tub: Tub/shower unit;Curtain Shower/tub characteristics: Engineer, building servicesCurtain Bathroom Toilet: Standard Bathroom Accessibility: Yes How Accessible: Accessible via walker Home Equipment: Walker - 4 wheels;Transport chair          Prior Functioning/Environment Level of Independence: Needs assistance  Gait / Transfers Assistance Needed: Pt usually able to amb with rollator without assist. ADL's / Homemaking Assistance Needed: Pt overall per mother is fairly dependent with all adls and homemaking.  Pt occasionally will feed self with spoon but not very often.  Pt dependent on depends for all toileting needs.  Pt's mother bathes and dresses her as well.  They do get her in the tub 1x each week and they do not have a bench.  Pt attempts to stand the whole time in shower while family holds her.  Pt would benefit greatly from a tub bench to avoid falls and allow her to continue to safely get in tub 1x per week.          OT Diagnosis: Generalized weakness;Cognitive deficits   OT Problem List:     OT Treatment/Interventions:      OT Goals(Current goals can be found in the care plan section) Acute Rehab OT Goals Patient Stated Goal: Mother - to return home and return to baseline.  OT Frequency:     Barriers to D/C:            Co-evaluation              End of Session Nurse Communication: Mobility status  Activity Tolerance: Patient tolerated treatment well Patient left: in chair;with call bell/phone within reach;with family/visitor present   Time: 0840-0900 OT Time Calculation (min): 20 min Charges:  OT General Charges $OT Visit: 1 Procedure OT Evaluation $Initial OT Evaluation Tier I: 1 Procedure OT Treatments $Self Care/Home Management : 8-22 mins G-Codes:    Hope BuddsJones, Duglas Heier Anne 04/06/2014, 9:12 AM [phone]

## 2014-04-06 NOTE — Progress Notes (Signed)
I discussed with  Dr McKeag.  I agree with their plans documented in their progress note.  

## 2014-04-06 NOTE — Progress Notes (Signed)
Family Medicine Teaching Service Daily Progress Note Intern Pager: [phone]  Patient name: Michele PineRobin C Bourdon Medical record number: [id number] Date of birth: 05-Oct-1985 Age: 28 y.o. Gender: female  Primary Care Provider: Gildardo CrankerHess, Bryan, DO Consultants: None Code Status: Full  Pt Overview and Major Events to Date:  10/15: admitted for AMS/weakness 2/2 suspected dehydration  Assessment and Plan: Michele [name] is a 28 y.o. female presenting with new-onset fatigue and weakness. PMH is significant for CP, MR, seizures, and reflux.   #Fatigue/AMS with associated weakness, presumed dehydration - improved per mom's report. - PT/OT consulted for ambulation - prn zofran  - D/C IVF, continue to monitor PO intake  - Failed LP and LP under fluoro in ED -- suspicion low for meningitis so will not continue unless sxs change  #UTI - Culture + enterococcus - continue PO encouragement and IVF - Ampicillin, start 10/16 >>>, sensitive to amoxicillin, switch today and f/u for d/c tomorrow   #Seizures - Hx of difficult to manage seizures, followed closely by Peds Neuro (Dr. Sharene SkeansHickling). Last seizure was 1 week ago (hx of regular seizures about q 1-2 months).  -Continue home medications: keppra, depakote, topamax, banzel (mother brought topamax / banzel from home to be admin per pharm)  -Dr. Sharene SkeansHickling contacted. Agrees w/ current plan. Welcomes f/u appointment in 1 month assuming no unforeseen setbacks. -seizure precautions  # Macrocytic Anemia: Hgb 10.0; MCV 109.8. on folic acid. Vit B12 wnl. Hemoglobin stbale - will monitor  # Reflux  -continue Protonix   # Rash: mom states this started after a dentist visit. She has used clotrimazole with some improvement. Overall it is looking better than before. - restart clotrimazole  # Constipation: patient on lactulose at home. Not continued on admission - restart lactulose  FEN/GI: Regular diet; SL IV  Prophylaxis: Subq Hep   Disposition: Discharge pending  improvement in AMS and sensitivities for UTI  Subjective:  Doing better, almost back to baseline. Awaiting BM and ambulation via PT for DC.  Objective: Temp:  [97.5 F (36.4 C)-97.8 F (36.6 C)] 97.8 F (36.6 C) (10/19 0547) Pulse Rate:  [67-78] 72 (10/19 0547) Resp:  [18-21] 18 (10/19 0547) BP: (110-123)/(67-75) 123/75 mmHg (10/19 0547) SpO2:  [95 %-99 %] 95 % (10/19 0547) Weight:  [174 lb 4.4 oz (79.05 kg)] 174 lb 4.4 oz (79.05 kg) (10/18 2131)  Physical Exam: General: well-developed, NAD, AA female lying in bed, awake, obese Head: normocephalic and atraumatic.  Mouth: Angular cheilitis with dry MM Neck: supple, no JVD Lungs: CTAB, normal respiratory effort, no accessory muscle use, no crackles, and no wheezes.  Heart: RRR, no M/R/G.  Abdomen: Bowel sounds normal; abdomen soft and nontender.  Pulses: 1+ DP/PT  Extremities: Edema present bilaterally Neurologic: Patient alert but not able to assess orientation. She is non-verbal    Laboratory:  Recent Labs Lab 04/02/14 1023 04/03/14 0500 04/04/14 0845  WBC 7.0 5.9 6.3  HGB 11.9* 10.0* 10.6*  HCT 37.7 31.4* 33.6*  PLT 124* 110* 120*    Recent Labs Lab 04/02/14 1023 04/03/14 0500  NA 143 146  K 3.9 4.0  CL 104 110  CO2 28 29  BUN 15 16  CREATININE 0.79 0.76  CALCIUM 10.1 9.0  PROT 7.8  --   BILITOT 0.3  --   ALKPHOS 89  --   ALT 34  --   AST 44*  --   GLUCOSE 80 66*     Kathee DeltonIan D McKeag, MD 04/06/2014, 12:25 PM PGY-1 [facility] Intern pager: [phone], text pages welcome

## 2014-04-07 DIAGNOSIS — R159 Full incontinence of feces: Secondary | ICD-10-CM

## 2014-04-07 NOTE — Progress Notes (Signed)
Family Medicine Interim Progress Note  Called by RN Morrie SheldonAshley after patient had an assisted gentle "fall."  Nurse and mother were helping patient to bedside chair when she slipped.  Nurse and mother gently lower patient to sitting on floor.  Patient is unhurt and acting as normal self.  [facility]  Went to see patient and no tenderness on exam in b/l LEs or abdomen.  No ecchymoses noted.  Patient stable for discharge.   Shirlee LatchAngela Cam Harnden, MD, MPH PGY-1,  [facility] 04/07/2014 5:45 PM

## 2014-04-07 NOTE — Progress Notes (Signed)
ED CM spoke with Devin GoingVorretta Martin (mother and care giver) concerning recommendations [facility]. Mom is agreeable.Medicaid insurance, patient is unable to leave home at this time.  Offered choice, read list, [facility]  Verified contact information. Referral placed by faxed to [phone], received fax confirmation. Discussed that someone [facility] will contact Mom at numbers verified in record within 24-48 hours. Mom verbalizes understanding, and appreciation for the assistance. Provided mom with my contact information along with [facility] contact number  [phone]. No further CM needs identified at this time.

## 2014-04-07 NOTE — Discharge Summary (Signed)
I discussed with  Dr McKeag.  I agree with their plans documented in their progress note.  

## 2014-04-07 NOTE — Progress Notes (Signed)
Patient for discharge this pm. Mother requested patient be gotten to chair prior to discharge. MD requested PT to evaluate. Per PT, evaluation had been done and patient ready for discharge. With use of gait belt,RN and mother(works as NT) transferred patient to chair. Patient wearing plastic brief from home and when sitting patient in chair, patient slid off edge of chair (assisted) to floor. Mother and RN able to assist patient up to chair. No s/s of injury,mother wanting to continue with d/c. MD notified,assessed patient and approved discharge as previously planned.

## 2014-04-07 NOTE — Discharge Instructions (Signed)
Amoxicillin capsules or tablets What is this medicine? AMOXICILLIN (a mox i SIL in) is a penicillin antibiotic. It is used to treat certain kinds of bacterial infections. It will not work for colds, flu, or other viral infections. This medicine may be used for other purposes; ask your health care provider or pharmacist if you have questions. COMMON BRAND NAME(S): Amoxil, Moxilin, Sumox, Trimox What should I tell my health care provider before I take this medicine? They need to know if you have any of these conditions: -asthma -kidney disease -an unusual or allergic reaction to amoxicillin, other penicillins, cephalosporin antibiotics, other medicines, foods, dyes, or preservatives -pregnant or trying to get pregnant -breast-feeding How should I use this medicine? Take this medicine by mouth with a glass of water. Follow the directions on your prescription label. You may take this medicine with food or on an empty stomach. Take your medicine at regular intervals. Do not take your medicine more often than directed. Take all of your medicine as directed even if you think your are better. Do not skip doses or stop your medicine early. Talk to your pediatrician regarding the use of this medicine in children. While this drug may be prescribed for selected conditions, precautions do apply. Overdosage: If you think you have taken too much of this medicine contact a poison control center or emergency room at once. NOTE: This medicine is only for you. Do not share this medicine with others. What if I miss a dose? If you miss a dose, take it as soon as you can. If it is almost time for your next dose, take only that dose. Do not take double or extra doses. What may interact with this medicine? -amiloride -birth control pills -chloramphenicol -macrolides -probenecid -sulfonamides -tetracyclines This list may not describe all possible interactions. Give your health care provider a list of all the  medicines, herbs, non-prescription drugs, or dietary supplements you use. Also tell them if you smoke, drink alcohol, or use illegal drugs. Some items may interact with your medicine. What should I watch for while using this medicine? Tell your doctor or health care professional if your symptoms do not improve in 2 or 3 days. Take all of the doses of your medicine as directed. Do not skip doses or stop your medicine early. If you are diabetic, you may get a false positive result for sugar in your urine with certain brands of urine tests. Check with your doctor. Do not treat diarrhea with over-the-counter products. Contact your doctor if you have diarrhea that lasts more than 2 days or if the diarrhea is severe and watery. What side effects may I notice from receiving this medicine? Side effects that you should report to your doctor or health care professional as soon as possible: -allergic reactions like skin rash, itching or hives, swelling of the face, lips, or tongue -breathing problems -dark urine -redness, blistering, peeling or loosening of the skin, including inside the mouth -seizures -severe or watery diarrhea -trouble passing urine or change in the amount of urine -unusual bleeding or bruising -unusually weak or tired -yellowing of the eyes or skin Side effects that usually do not require medical attention (report to your doctor or health care professional if they continue or are bothersome): -dizziness -headache -stomach upset -trouble sleeping This list may not describe all possible side effects. Call your doctor for medical advice about side effects. You may report side effects to FDA at 1-800-FDA-1088. Where should I keep my medicine? Keep out  of the reach of children. Store between 68 and 77 degrees F (20 and 25 degrees C). Keep bottle closed tightly. Throw away any unused medicine after the expiration date. NOTE: This sheet is a summary. It may not cover all possible  information. If you have questions about this medicine, talk to your doctor, pharmacist, or health care provider.  2015, Elsevier/Gold Standard. (2007-08-27 14:10:59)  Seek immediate care if Michele [name] [name] has a fever, worsened symptoms, or you have other concerns. Hold off on restarting lasix until she is seen in the office and we know she is eating well. If she starts seeming like she needs it, call our office or go ahead and restart if she is like usual. Follow up as scheduled with Dr Sharene SkeansHickling. [facility].

## 2014-04-08 ENCOUNTER — Ambulatory Visit: Payer: Medicaid Other

## 2014-04-08 LAB — CULTURE, BLOOD (ROUTINE X 2)
CULTURE: NO GROWTH
Culture: NO GROWTH

## 2014-04-08 NOTE — Progress Notes (Signed)
Received a call from patients mother indicating that she had misplaced the phone number for [facility]. Ms. Michele DeutscherMartin was given the phone number for [facility] [phone]. Santos Hardwick, Charlyne Qualeamara Johnson

## 2014-04-16 ENCOUNTER — Encounter: Payer: Self-pay | Admitting: Family Medicine

## 2014-04-16 ENCOUNTER — Ambulatory Visit (INDEPENDENT_AMBULATORY_CARE_PROVIDER_SITE_OTHER): Payer: Medicaid Other | Admitting: Family

## 2014-04-16 ENCOUNTER — Encounter: Payer: Self-pay | Admitting: Family

## 2014-04-16 ENCOUNTER — Ambulatory Visit (INDEPENDENT_AMBULATORY_CARE_PROVIDER_SITE_OTHER): Payer: Medicaid Other | Admitting: Family Medicine

## 2014-04-16 VITALS — BP 120/74 | HR 78 | Ht <= 58 in | Wt 175.0 lb

## 2014-04-16 DIAGNOSIS — E860 Dehydration: Secondary | ICD-10-CM | POA: Insufficient documentation

## 2014-04-16 DIAGNOSIS — G808 Other cerebral palsy: Secondary | ICD-10-CM

## 2014-04-16 DIAGNOSIS — R269 Unspecified abnormalities of gait and mobility: Secondary | ICD-10-CM

## 2014-04-16 DIAGNOSIS — G40311 Generalized idiopathic epilepsy and epileptic syndromes, intractable, with status epilepticus: Secondary | ICD-10-CM

## 2014-04-16 DIAGNOSIS — G40319 Generalized idiopathic epilepsy and epileptic syndromes, intractable, without status epilepticus: Secondary | ICD-10-CM

## 2014-04-16 LAB — BASIC METABOLIC PANEL
BUN: 24 mg/dL — ABNORMAL HIGH (ref 6–23)
CO2: 24 mEq/L (ref 19–32)
Calcium: 10.1 mg/dL (ref 8.4–10.5)
Chloride: 105 mEq/L (ref 96–112)
Creat: 0.68 mg/dL (ref 0.50–1.10)
GLUCOSE: 80 mg/dL (ref 70–99)
POTASSIUM: 4 meq/L (ref 3.5–5.3)
SODIUM: 143 meq/L (ref 135–145)

## 2014-04-16 MED ORDER — FUROSEMIDE 20 MG PO TABS: 20.0000 mg | ORAL_TABLET | Freq: Every day | ORAL | Status: DC | PRN

## 2014-04-16 NOTE — Progress Notes (Signed)
Michele PineRobin C [name] is a 28 y.o. female who presents today for hospital f/u for UTI/AMS  UTI - Pt found to have enterococcus UTI in hospital with some changes in her mentation ( She is MR-CP, with baseline functioning requiring 24 hr care).  She was initially started on cefepime/vancomycin and narrowed to ampicillin eventually being discharge on amoxicillin.  She has completed her Amoxicillin, and has not had any trouble since then.  Denies any foul smelling urine or any fevers.  She has continued to be slightly decreased from her normal energy and her PO intake has not rebounded yet.   Past Medical History  Diagnosis Date  . Cerebral palsy   . Lennox-Gastaut syndrome   . Profound mental retardation   . Osteoporosis   . Rickets   . Hypothyroidism   . Seizures   . Reflux     History  Smoking status  . Never Smoker   Smokeless tobacco  . Never Used    Family History  Problem Relation Age of Onset  . Diabetes      MGGM  . Heart disease      MGGF  . Kidney disease      Maternal Great Aunt   . Colon cancer Neg Hx   . Hypertension Mother   . Cancer Maternal Aunt     lung  . Heart disease Maternal Grandmother     Died at 3564  . Stroke Maternal Grandfather     died at 4679  . Hypertension Paternal Grandmother     Current Outpatient Prescriptions on File Prior to Visit  Medication Sig Dispense Refill  . alendronate (FOSAMAX) 70 MG tablet Take 70 mg by mouth once a week. Take on Saturdays with a full glass of water on an empty stomach.      Marland Kitchen. amoxicillin (AMOXIL) 500 MG capsule Take 1 capsule (500 mg total) by mouth 2 (two) times daily.  10 capsule  0  . Calcium Carbonate-Vitamin D (CALCARB 600/D) 600-400 MG-UNIT per tablet Take 1 tablet by mouth 2 (two) times daily. 1 tab per tube 2 time a day      . CARNITOR 1 GM/10ML solution Give 5ml by mouth 3 times per day  510 mL  5  . clotrimazole (LOTRIMIN) 1 % cream Apply 1 application topically 2 (two) times daily.      . divalproex (DEPAKOTE  SPRINKLE) 125 MG capsule Take 1,000 mg by mouth 2 (two) times daily.      . folic acid (FOLVITE) 1 MG tablet Take 1 mg by mouth every morning. 1 tab per tube daily      . lactulose (CHRONULAC) 10 GM/15ML solution TAKE 30ML BY MOUTH TWICE DAILY  240 mL  0  . levETIRAcetam (KEPPRA) 100 MG/ML solution Take 1,200 mg by mouth 2 (two) times daily. 12 mls      . ondansetron (ZOFRAN) 4 MG/5ML solution Take 5 mLs (4 mg total) by mouth every 8 (eight) hours as needed for nausea or vomiting.  50 mL  1  . pantoprazole sodium (PROTONIX) 40 mg/20 mL PACK Place 20 mg into feeding tube daily.      . Rufinamide (BANZEL) 40 MG/ML SUSP Take 200 mg by mouth 2 (two) times daily.       Marland Kitchen. topiramate (TOPAMAX SPRINKLE) 25 MG capsule Take 150 mg by mouth 2 (two) times daily.       No current facility-administered medications on file prior to visit.    ROS: Per HPI.  All other systems reviewed and are negative.   Physical Exam Filed Vitals:   04/16/14 1453  BP: 120/74  Pulse: 78    Physical Examination: General appearance - alert, well appearing, and in no distress Neck - supple, no significant adenopathy Chest - clear to auscultation, no wheezes, rales or rhonchi, symmetric air entry Heart - normal rate and regular rhythm, no murmurs noted Pulm: CTAB     Lab Results  Component Value Date   WBC 6.3 04/04/2014   HGB 10.6* 04/04/2014   HCT 33.6* 04/04/2014   MCV 110.9* 04/04/2014   PLT 120* 04/04/2014

## 2014-04-16 NOTE — Assessment & Plan Note (Signed)
Decreased PO intake since being d/c from the hospital about 6-7 days ago now. - Will obtain BMET/CBC to make sure she has not developed a new infection and that she is not hyponatremic from dehydration - Continue to encourage PO intake, which she may have not recovered completely from her recent hospitalization - F/U in 3-4 days to see how she has improved and to discuss PO intake

## 2014-04-16 NOTE — Patient Instructions (Signed)
I have referred Ikhlas for Physical [facility] to help with strengthening her legs and helping her to bear weight and take steps.  I have also referred her for occupational therapy to see if they can help her with feeding herself.  [facility] will contact you about these appointments. If you do not hear from them sometime next week, feel free to call them at [phone].  For your concerns about her menstrual periods and her breast changes, talk to her primary care doctor about a referral to a gynecologist.   The bruising on her body from the Heparin that she had in the hospital will take time to go away. She may continue to have bruises show up for awhile.   Continue Nasrin's seizure medication without change for now. Let me know if she has any seizures.   Work on getting Ameena's sleep pattern back to normal.   I have given you a note for your employer and for [facility]'s day program. Let me know if you need anything else.   I will see her as planned in January unless she needs to be seen sooner.

## 2014-04-16 NOTE — Progress Notes (Signed)
Patient: Michele [name] MRN: [id number] Sex: female DOB: 19-Dec-1985  Provider: Elveria Rising, NP Location of Care: [facility]  Note type: Routine return visit  History of Present Illness: Referral Source: Dr. Asher Muir History from: her mother Chief Complaint: Seizures  Michele COCCIA is a 28 y.o. 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 She was last seen January 05, 2014. She has had seizures as a child that were associated with high fever. 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 Her mother calls between visits to report seizures. Michele [name] is seen today because of a hospitalization that occurred October 15 - 20, 2015.  She was admitted to [facility] on October 15th for alteration of mental status and complaints of weakness by her mother.  Mom said that Michele [name] had a seizure about a week prior to the admission, and after that had coughing, was sleepy, did not eat as well as usual, and was less able to stand and bear weight. There was some question about an elevation in her Depakote level but it is not clear to me if the initial level drawn was a trough level. She had an ammonia level drawn that was mildly elevated at 64 mcg/ml. Michele [name] had a work up done in the hospital that was negative for pneumonia, sepsis and meningitis. She was treated for dehydration and given Heparin subcutaneously for prophylaxis of thrombosis. She had no seizures during her hospitalization and no changes were made her seizure treatment plan.   Michele [name] is taking and tolerating Depakote, Keppra, Topamax and Banzel for her seizure disorder. She has had no seizures since the seizure mentioned in early October.   Today Mom is concerned about the residual bruising from the Heparin injections, as well as bruising on her arms from the IV's during the hospitalization, and scattered bruises on her legs. She has had  ongoing problems with fairly significant dependent edema. Michele [name] is now non-ambulatory. She used to be able to stand and take a few steps with assistance, but now she has to be lifted for transfers. She had a foot injury in December, and has been less able to stand and bear weight since then. Mom says that Michele [name] is no longer able to feed herself, but that Michele [name] was once able to do so. Michele [name] still claps her hands together and fidgets with her fingers but is less purposeful than she was in the past. Mom says that when she does grasp that her grip is diminished. Mom is also concerned about the fact that Michele [name] has not had a menstrual period since June, and that her breasts are now asymmetrical. She says that the change in breast size has occurred since the menses have stopped.   Michele [name] has a follow up appointment with her PCP later today. Michele [name] has not returned to her day program since the hospitalization as she is no longer able to stand and pivot or take steps to use the toilet. According to her mother, since she now has to be lifted out of the wheelchair in order to change diapers and provide personal hygiene, she cannot attend the day program.   Review of Systems: 12 system review was remarkable for bruising, swollen breasts, no menses, not eating well, seizure, and weakness  Past Medical History  Diagnosis Date  . Cerebral palsy   . Lennox-Gastaut syndrome   . Profound mental retardation   . Osteoporosis   .  Rickets   . Hypothyroidism   . Seizures   . Reflux    Hospitalizations: Yes.  , Head Injury: No., Nervous System Infections: No., Immunizations up to date: Yes.   Past Medical History Comments: see Hx.  Surgical History Past Surgical History  Procedure Laterality Date  . Peg tube placement    . Gastrocutaneous fistual repair  2012    Family History family history includes Cancer in her maternal aunt; Diabetes in an other family member; Heart disease in her maternal grandmother and  another family member; Hypertension in her mother and paternal grandmother; Kidney disease in an other family member; Stroke in her maternal grandfather. There is no history of Colon cancer. Family History is otherwise negative for migraines, seizures, cognitive impairment, blindness, deafness, birth defects, chromosomal disorder, autism.  Social History History   Social History  . Marital Status: Single    Spouse Name: N/A    Number of Children: 0  . Years of Education: N/A   Occupational History  . Disabled     Social History Main Topics  . Smoking status: Never Smoker   . Smokeless tobacco: Never Used  . Alcohol Use: No  . Drug Use: No  . Sexual Activity: No   Other Topics Concern  . None   Social History Narrative   0 caffeine drinks    Lives with mother who helps ADLs.   Graduated from [employer] in 2010. Now attends day program at [employer].    Educational level: special education School Attending:[employer] Living with:  mother  Hobbies/Interest: none School comments:  Michele BallRobin is attending [employer] day program.  Physical Exam BP 120/74  Pulse 78  Ht 4\' 9"  (1.448 m)  Wt 175 lb (79.379 kg)  BMI 37.86 kg/m2  LMP 02/17/2014 General: well developed, well nourished female, seated in a wheelchair, in no evident distress  Head: head normocephalic and atraumatic.  Neck: supple with no carotid or supraclavicular bruits.  Respiratory: clear to auscultation  Cardiovascular: regular rate and rhythm, no murmurs. She had bruises on her abdomen, arms and legs. Her hands, lower legs and feet are edematous.  Musculoskeletal: no obvious deformities of her limbs, but it is difficult to assess due to edema and adipose tissue covering landmarks on her legs and feet. Her knees and ankles are barely perceptible even when palpated. Skin: She has a surgical scar on her abdomen. She has a scar on her forehead. She has no upper front teeth. She has no areas of pressure or broken skin. She has  no rashes or lesions.   Neurologic Exam  Mental Status: Awake and fully alert. She smiles responsively and laughs spontaneously. She claps her hands repeatedly. She has significant mental retardation. She has no language. She is unable to follow any commands.  Cranial Nerves: Fundoscopic exam reveals sharp disc margins. Pupils equal, briskly reactive to light. Extraocular movements appeared to be intact. Visual fields appeared full. Turns to localize sounds. Face, tongue, palate move normally and symmetrically. Neck flexion and extension normal.  Motor: She has mild quadriparesis. She has increased tone, particularly in her legs.  Sensory: Withdrawal x 4.  Coordination: She had fairly good fine motor movements with her hands. She could not follow instructions for adequate coordination testing. She did do well with hand clapping but I could not get her to do purposeful grabbing today. Gait and Station: She is wheelchair bound. Her mother and I got her up to her feet and she was unable to  bear weight or take steps. Reflexes: Diminished and symmetric. It was difficult to assess reflexes with her edema. I could not get plantar reflex response. Her feet are grossly edematous.   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 Michele BallRobin also has quadriparesis and is dependent upon others for activities of daily living. She was recently hospitalized and returns today for follow up. She will continue on her medications without change. Michele BallRobin is deconditioned and unable to stand and bear weight, which she used to be able to do to help with transfers. I recommended that she have physical therapy for leg strengthening and will send in a referral for that. I will also refer her for occupational therapy to see if she can regain the ability to feed herself. I talked with her mother about her concerns about bruising and explained  that it was from the Heparin therapy and that the bruising would improve over time. For her concerns about her menstrual periods and asymmetrical breasts, I recommended that she talk to her PCP about that. Michele BallRobin may need to be seen by a gynecologist about these problems. I gave Mom a note for her employer for missed time at work and a note for the day program as Michele BallRobin is unable to attend at this time. I asked Mom to continue to notify me about seizures, and I will see Michele BallRobin back in follow up in January as previously planned or sooner if needed. Mom agreed with these plans.

## 2014-04-16 NOTE — Patient Instructions (Signed)
Please continue to try and encourage as much intake with fluids and food as possible. If she develops a fever (greater than 100.4), she needs to be seen in clinic or the emergency room. Otherwise, we will see her back in about 3-4 days.  Thanks, Dr. Paulina FusiHess and Dr. Ardeth SportsmanElvira

## 2014-04-17 ENCOUNTER — Telehealth: Payer: Self-pay | Admitting: Family Medicine

## 2014-04-17 LAB — CBC WITH DIFFERENTIAL/PLATELET
BASOS ABS: 0 10*3/uL (ref 0.0–0.1)
BASOS PCT: 0 % (ref 0–1)
EOS PCT: 1 % (ref 0–5)
Eosinophils Absolute: 0.1 10*3/uL (ref 0.0–0.7)
HEMATOCRIT: 27.7 % — AB (ref 36.0–46.0)
Hemoglobin: 9.7 g/dL — ABNORMAL LOW (ref 12.0–15.0)
Lymphocytes Relative: 31 % (ref 12–46)
Lymphs Abs: 2.9 10*3/uL (ref 0.7–4.0)
MCH: 35.7 pg — ABNORMAL HIGH (ref 26.0–34.0)
MCHC: 35 g/dL (ref 30.0–36.0)
MCV: 101.8 fL — AB (ref 78.0–100.0)
MONO ABS: 1.1 10*3/uL — AB (ref 0.1–1.0)
Monocytes Relative: 12 % (ref 3–12)
Neutro Abs: 5.2 10*3/uL (ref 1.7–7.7)
Neutrophils Relative %: 56 % (ref 43–77)
Platelets: 110 10*3/uL — ABNORMAL LOW (ref 150–400)
RBC: 2.72 MIL/uL — ABNORMAL LOW (ref 3.87–5.11)
RDW: 20.6 % — AB (ref 11.5–15.5)
WBC: 9.2 10*3/uL (ref 4.0–10.5)

## 2014-04-17 NOTE — Telephone Encounter (Signed)
Thereasa [facility]Rodney from [facility] called and needs new orders for the patients bed. It has to be more specific. It has to be written stating [employer] hospital bed. The orders also have to state what elevation patients head should be. Before writing orders please call Thereasa DistanceRodney at [phone] to discuss what the order have to say. jw

## 2014-04-17 NOTE — Telephone Encounter (Signed)
Ready for fax.   Thanks [employer] R. Paulina FusiHess, DO [facility] 04/17/2014, 5:12 PM

## 2014-04-20 NOTE — Telephone Encounter (Signed)
Faxed to [facility]  

## 2014-04-27 ENCOUNTER — Ambulatory Visit: Payer: Medicaid Other | Attending: Family

## 2014-04-27 DIAGNOSIS — Z5189 Encounter for other specified aftercare: Secondary | ICD-10-CM | POA: Insufficient documentation

## 2014-04-27 DIAGNOSIS — G809 Cerebral palsy, unspecified: Secondary | ICD-10-CM | POA: Diagnosis not present

## 2014-04-27 DIAGNOSIS — R5381 Other malaise: Secondary | ICD-10-CM | POA: Diagnosis not present

## 2014-04-27 DIAGNOSIS — R279 Unspecified lack of coordination: Secondary | ICD-10-CM | POA: Diagnosis not present

## 2014-04-27 NOTE — Therapy (Signed)
Physical Therapy Evaluation  Patient Details  Name: Michele [name] MRN: [id number] Date of Birth: 1986/04/06  Encounter Date: 04/27/2014      PT End of Session - 04/27/14 0926    Visit Number 1   Date for PT Re-Evaluation 06/19/14   Authorization Type Awaiting Medicaid authorization   PT Start Time 0800   PT Stop Time 0900   PT Time Calculation (min) 60 min   Equipment Utilized During Treatment Gait belt   Activity Tolerance Patient limited by fatigue;Patient limited by lethargy      Past Medical History  Diagnosis Date  . Cerebral palsy   . Lennox-Gastaut syndrome   . Profound mental retardation   . Osteoporosis   . Rickets   . Hypothyroidism   . Seizures   . Reflux     Past Surgical History  Procedure Laterality Date  . Peg tube placement    . Gastrocutaneous fistual repair  2012    LMP 02/17/2014  Visit Diagnosis:  Lack of coordination - Plan: PT plan of care cert/re-cert  Physical debility - Plan: PT plan of care cert/re-cert      Subjective Assessment - 04/27/14 0811    Symptoms Copied from recent doctor's office visit "Michele [name] is a 28 y.o. 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 She was last seen January 05, 2014. She has had seizures as a child that were associated with high fever. 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 Her mother calls between visits to report seizures. Joannah is seen today because of a hospitalization that occurred October 15 - 20, 2015."  Pt's mom is her primary caregiver. Mom reports that pt had recent hospitalizatoin x6 days with concern for Meningitis, however she reports this was ruled out. Pt was diagnosed with UTI and this has been treated. She is done with her antibiotics. Pt has been unable to use her hands and legs, unable to assist with transfers . Pt's mom . Pt'sPt's mom reports that for a month prior to this UTI she was noticing that  the patient began dragging her leg while walking, unable to hold onto abjects any more and eventually was unable to walk at all.  (September is when pt lost ability to ambulate)  also unable to eat/swallow   Pertinent History Seizures, intellectual disabliity since birth,    Patient Stated Goals For pt to be able to walk again, and assist with sit to stands   Currently in Pain? Other (Comment)  does not appear to be in pain, calm          [facility] PT Assessment - 04/27/14 0901    Assessment   Medical Diagnosis Cerebral Palsy, UTI,    Onset Date 02/17/14  september is when pt's mother noticed mobility problems   Precautions   Precautions Other (comment)  Seizures   Precaution Comments no electrical stimulation   Balance Screen   Has the patient fallen in the past 6 months No   Has the patient had a decrease in activity level because of a fear of falling?  No   Is the patient reluctant to leave their home because of a fear of falling?  No   Home Environment   Living Enviornment Private residence   Research officer, trade union;Other relatives   Available Help at Discharge Family   Type of Home House   Home Access Ramped entrance   Home Layout One level  Home Equipment Walker - 4 wheels;Wheelchair - manual   Prior Function   Level of Independence Needs assistance with ADLs;Needs assistance with transfers;Other (comment)   Cognition   Overall Cognitive Status Difficult to assess   Difficult to assess due to Level of arousal  intellectual disability   Observation/Other Assessments   Observations pt lacks oral control (drooling), and trunk control. Pt requires a belt to keep her trunk supported against the wheelchair   she is clean and family is very supportive   Posture/Postural Control   Posture/Postural Control Postural limitations   Postural Limitations Rounded Shoulders;Forward head;Increased thoracic kyphosis;Posterior pelvic tilt;Flexed trunk  poor trunk control   Transfers    Transfers Squat Pivot Transfers;Sit to Stand   Sit to Stand 1: +2 Total assist  pt unable to follow cues to assist   Sit to Stand: Patient Percentage 0%   Squat Pivot Transfers 1: +2 Total assist   Squat Pivot Transfers: Patient Percentage 0%   Transfer via Lift Equipment --  Pt's family reports having a lift at home   Ambulation/Gait   Ambulation/Gait No  non ambulatory at this time. Requires total assist to stand   Balance   Balance Assessed Yes   Static Sitting Balance   Static Sitting - Balance Support Feet supported   Static Sitting - Level of Assistance 1: +1 Total assist;5: Stand by assistance  varies   Static Sitting - Comment/# of Minutes Once pt attained sitting unsupported  with supervision, able to maintain x3 minutes   Dynamic Sitting Balance   Dynamic Sitting - Balance Support Feet supported   Dynamic Sitting - Level of Assistance 1: +1 Total assist;5: Stand by assistance;Other (comment)  varies   Reach (Patient is able to reach ___ inches to right, left, forward, back) 4" forward, and 4" anterolateral right                PT Long Term Goals - 04/27/14 1351    PT LONG TERM GOAL #1   Title Family to demonstrate ability to guide pt through home exercise program to maximize trunk control and improve sitting balance (progressing to standing balance if appropriate)    Baseline Family unaware of exercises to help pt   Time --  3 visits or 4 weeks (if pt is approved for more than 3 visits)   Status New   PT LONG TERM GOAL #2   Title Pt will perform sit to stand with MAX A of 1 person.   Baseline 2-3 person dependent transfers   Time --  3 visits or 4 weeks  (if pt is approved for more than 3 visits)   Status New   PT LONG TERM GOAL #3   Title Stand pivot transfer toward one direction with MAX A of 1 person   Baseline 2-3 person dependent tranfsers   Time --  3 visits or 4 weeks (if pt is approved for more than 3 visits)   Status New   PT LONG TERM GOAL #4    Title Pt will initiate gait training (in parallel bars or countertop or other LRAD)   Baseline non-ambulatory   Time --  3 visits or 4 weeks (if pt is approved for more than 3 visits)   Status New   PT LONG TERM GOAL #5   Title Pt will sit without UE support with supervision with feet on the floor and without back support x 5 mintues without loss of balance.   Baseline Can sit  3 minutes after multiple attempts/trials   Time --  3 visits or 4 weeks (if pt is approved for more than 3 visits)   Status New          Plan - 04/27/14 0928    Clinical Impression Statement Pt's diagnosis is UTI, however according to pt's family, pt has had significant functional mobility decline and is having difficulty swallowing and performing tasks with upper extremities. I recommend further medical evaluation to determine if    Pt will benefit from skilled therapeutic intervention in order to improve on the following deficits Decreased activity tolerance;Decreased balance;Decreased endurance;Decreased coordination;Decreased mobility;Other (comment)  inability to ambulate, dependent for all transfers   Rehab Potential Fair   Clinical Impairments Affecting Rehab Potential intellectual disability, unable to follow commands   PT Frequency 1x / week   PT Duration Other (comment)  recommend 1x/week for 8-12 weeks as this is likely a new neurological diagnosis or severe neurological imapirment due to seizure or medicatoin side effects and would benefit from more visits but limited due to insurance limitations.   PT Treatment/Interventions Gait training;Functional mobility training;Therapeutic activities;Balance training;Neuromuscular re-education;Therapeutic exercise;Patient/family education;Wheelchair mobility training   PT Next Visit Plan family training and home exercise program   Recommended Other Services speech therapy as pt is unable to swallow, and OT as pt cannot use BUE as she did prior to this episode    Consulted and Agree with Plan of Care Family member/caregiver        Problem List Patient Active Problem List   Diagnosis Date Noted  . Dehydration 04/16/2014  . Lethargy 04/02/2014  . Laceration re-check 02/05/2014  . 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 Long-term use of high-risk medication 12/24/2012  . Morbid obesity 12/24/2012  . Congenital quadriplegia 12/24/2012  . Leg swelling 03/10/2011  . URINARY INCONTINENCE 02/25/2010  . 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 HYPOTHYROIDISM 01/29/2009  . MENTAL RETARDATION, PROFOUND 01/29/2009  . Infantile cerebral palsy 01/29/2009  . GASTROESOPHAGEAL REFLUX DISEASE 01/29/2009  . OSTEOPOROSIS 01/29/2009  . Seizure disorder 01/29/2009                                            Lamar LaundryAlderson, Domonic Kimball D 04/27/2014, 2:11 PM

## 2014-04-28 ENCOUNTER — Telehealth: Payer: Self-pay | Admitting: *Deleted

## 2014-04-28 NOTE — Telephone Encounter (Signed)
I called and talked to Michele [name]. She is concerned about Michele [name]'s general functioning and feels that she is not as well as she has been in the past. She said that her hands twitch at times and that she shivers at times even when it is not cold. She thinks that it happens every day. She said that there was no change in Michele [name]'s responsiveness. I asked her to monitor it and tell me how often it occurs and how long it lasts. I am not sure that this is seizure behavior. I answered her other questions and reassured her that the problems listed below were not related to Depakote. I encouraged her to call back if she has further questions. TG

## 2014-04-28 NOTE — Telephone Encounter (Signed)
[name], Michele [name], stated the pt went to the therapist yesterday for her first visit. The mother said the symptoms that Michele [name] is having like the dark areas, swelling, hair loss and not wanting to eat is coming from the Depakote. The mother said the pt is still having weakness and is off balance. The mother can be reached on her cell at [phone] or home at [phone].

## 2014-04-29 ENCOUNTER — Other Ambulatory Visit: Payer: Self-pay | Admitting: Family

## 2014-04-29 ENCOUNTER — Ambulatory Visit: Payer: Medicaid Other

## 2014-04-29 ENCOUNTER — Other Ambulatory Visit: Payer: Self-pay | Admitting: Family Medicine

## 2014-04-29 DIAGNOSIS — G809 Cerebral palsy, unspecified: Secondary | ICD-10-CM

## 2014-04-29 MED ORDER — DIASTAT ACUDIAL 20 MG RE GEL: RECTAL | Status: DC

## 2014-05-01 NOTE — Telephone Encounter (Signed)
Mom has not called back. I will await her call about frequency of episodes of shivering and hand twitching that she described. TG

## 2014-05-06 ENCOUNTER — Telehealth: Payer: Self-pay | Admitting: *Deleted

## 2014-05-06 DIAGNOSIS — G40319 Generalized idiopathic epilepsy and epileptic syndromes, intractable, without status epilepticus: Secondary | ICD-10-CM

## 2014-05-06 DIAGNOSIS — G809 Cerebral palsy, unspecified: Secondary | ICD-10-CM

## 2014-05-06 DIAGNOSIS — G808 Other cerebral palsy: Secondary | ICD-10-CM

## 2014-05-06 NOTE — Telephone Encounter (Signed)
The mother needs an Rx for portable oxygen on 2 liters(?) for the pt so that she can take it to [facility]. She said they are going out of town for the holidays. The mother said she can come by and pick up the Rx. She can be reached at [phone].

## 2014-05-07 NOTE — Telephone Encounter (Signed)
I called and spoke to Mom. She said that [name] used to have a portable oxygen tank at home when she was younger because of the severity of her seizures. Then the seizures improved and Mom did not keep the tank. Mom said that since [name] has been having seizures again, that she felt that she should have oxygen with her when she travels, as they are planning a trip next week. She also said that a family member has oxygen and has used it for Jalaysha when she has had seizures at home. I told Mom that I would have to look into getting oxygen and would call her back. TG

## 2014-05-08 NOTE — Telephone Encounter (Signed)
[name] and let him know that there was no other information to provide. He said that he understood and would give Mom a private pay quote to see if she wanted to proceed. TG

## 2014-05-08 NOTE — Telephone Encounter (Signed)
[facility] for [facility] yesterday and did not receive a call back. I called again today and was told to fax an order and demographic information to [phone]. I will fax the order as requested. TG

## 2014-05-08 NOTE — Telephone Encounter (Signed)
Noted, I agree.

## 2014-05-08 NOTE — Telephone Encounter (Signed)
Thereasa DistanceRodney, of [facility], stated regarding the referral for oxygen, that under the information that he received, the pt does not meet the criteria for her insurance to cover the equipment. He said that he is looking for any other information that might support this or if an oxygen test was performed in the office. Thereasa DistanceRodney can be reached at [phone] ext. [phone]

## 2014-05-19 ENCOUNTER — Ambulatory Visit: Payer: Medicaid Other

## 2014-05-19 ENCOUNTER — Ambulatory Visit: Payer: Medicaid Other | Admitting: Occupational Therapy

## 2014-05-19 ENCOUNTER — Encounter: Payer: Self-pay | Admitting: Family Medicine

## 2014-05-19 ENCOUNTER — Ambulatory Visit (INDEPENDENT_AMBULATORY_CARE_PROVIDER_SITE_OTHER): Payer: Medicaid Other | Admitting: Family Medicine

## 2014-05-19 VITALS — BP 132/81 | HR 87 | Temp 98.7°F

## 2014-05-19 DIAGNOSIS — R638 Other symptoms and signs concerning food and fluid intake: Secondary | ICD-10-CM

## 2014-05-19 DIAGNOSIS — D539 Nutritional anemia, unspecified: Secondary | ICD-10-CM

## 2014-05-19 DIAGNOSIS — R5381 Other malaise: Secondary | ICD-10-CM

## 2014-05-19 DIAGNOSIS — R634 Abnormal weight loss: Secondary | ICD-10-CM

## 2014-05-19 DIAGNOSIS — N912 Amenorrhea, unspecified: Secondary | ICD-10-CM | POA: Insufficient documentation

## 2014-05-19 LAB — CBC WITH DIFFERENTIAL/PLATELET
Basophils Absolute: 0 10*3/uL (ref 0.0–0.1)
Basophils Relative: 0 % (ref 0–1)
EOS PCT: 1 % (ref 0–5)
Eosinophils Absolute: 0.1 10*3/uL (ref 0.0–0.7)
HCT: 41.7 % (ref 36.0–46.0)
Hemoglobin: 12.3 g/dL (ref 12.0–15.0)
Lymphocytes Relative: 37 % (ref 12–46)
Lymphs Abs: 4 10*3/uL (ref 0.7–4.0)
MCH: 33.6 pg (ref 26.0–34.0)
MCHC: 29.5 g/dL — ABNORMAL LOW (ref 30.0–36.0)
MCV: 113.9 fL — ABNORMAL HIGH (ref 78.0–100.0)
MPV: 10.6 fL (ref 9.4–12.4)
Monocytes Absolute: 1 10*3/uL (ref 0.1–1.0)
Monocytes Relative: 9 % (ref 3–12)
Neutro Abs: 5.7 10*3/uL (ref 1.7–7.7)
Neutrophils Relative %: 53 % (ref 43–77)
PLATELETS: 300 10*3/uL (ref 150–400)
RBC: 3.66 MIL/uL — ABNORMAL LOW (ref 3.87–5.11)
RDW: 18.2 % — ABNORMAL HIGH (ref 11.5–15.5)
WBC: 10.8 10*3/uL — ABNORMAL HIGH (ref 4.0–10.5)

## 2014-05-19 LAB — COMPLETE METABOLIC PANEL WITH GFR
ALT: 18 U/L (ref 0–35)
AST: 23 U/L (ref 0–37)
Albumin: 3.6 g/dL (ref 3.5–5.2)
Alkaline Phosphatase: 78 U/L (ref 39–117)
BILIRUBIN TOTAL: 0.2 mg/dL — AB (ref 0.3–1.2)
BUN: 24 mg/dL — AB (ref 6–23)
CO2: 27 meq/L (ref 19–32)
CREATININE: 0.69 mg/dL (ref 0.50–1.10)
Calcium: 10.7 mg/dL — ABNORMAL HIGH (ref 8.4–10.5)
Chloride: 106 mEq/L (ref 96–112)
Glucose, Bld: 88 mg/dL (ref 70–99)
Potassium: 3.8 mEq/L (ref 3.5–5.3)
Sodium: 148 mEq/L — ABNORMAL HIGH (ref 135–145)
Total Protein: 7.6 g/dL (ref 6.0–8.3)

## 2014-05-19 NOTE — Assessment & Plan Note (Signed)
Likely related to recent stressor (hospitalization).

## 2014-05-19 NOTE — Patient Instructions (Addendum)
It was nice to see you today.  I am unsure of what is currently going on with Michele [name].  I am obtaining labs and a barium swallow to further evaluate.  I will be in touch regarding the work up.  Continue feeding as you have been.  Please call if she worsens (fever, SOB, altered mental status, etc).  Follow up after barium swallow (1-2 weeks).

## 2014-05-19 NOTE — Assessment & Plan Note (Signed)
Unclear etiology as history and physical exam are limited. Could be secondary to dysphagia.  Will therefore obtain barium swallow to assess. Obtaining CMP as well.

## 2014-05-19 NOTE — Progress Notes (Signed)
   Subjective:    Patient ID: Michele [name], female    DOB: 03-14-1986, 28 y.o.   MRN: [id number]  HPI 28 year old female with MR, CP, and seizure disorder presents to the clinic today accompanied by her mother.  Mother reports that she has a few issues currently.  Mother is concerned about lack of menses, change in PO intake, and weakness/change in baseline function.  History obtained from mother as patient is nonverbal.  1) Amenorrhea  Mother reports that she has not had a menstrual cycle since August.  She states that she has always had irregular periods.  Mother is very concerned about this.  Of note, [name] was recently hospitalized in October (recent stressor).  Unable to obtain any further history as patient is non verbal (vision changes, headaches, hot flashes, etc).  2) Change in status  Mother reports that since hospitalization she has not been her normal self.  She has been more fussy than normal.  She is now nonambulatory (she was previously ambulating with a walker).  She has not been as active as she once was.  3) Change in PO intake  Mother also reports a significant decline/change in her PO intake.  Mother states that prior to hospitalization she was eating chopped food.  Recently she has had difficulty feeding her and has resorted to stage 2 baby foods which she is feeding her via a syringe.  She states that she seems to have trouble swallowing.     Review of Systems Per HPI    Objective:   Physical Exam Filed Vitals:   05/19/14 1425  BP: 132/81  Pulse: 87  Temp: 98.7 F (37.1 C)   Exam: General: chronically ill appearing female, sitting in wheelchair; NAD.  HEENT: NCAT. Drooling noted. MMM.  Could not view oropharynx as patient would not open her mouth. Cardiovascular: RRR. No murmurs, rubs, or gallops. Respiratory: Poor inspiratory effort; No adventitious sounds auscultated. Abdomen: soft, nontender, nondistended.  Neuro:  Awake and alert.  Decreased function/tone of LLE. Unable to assess strength as patient cannot follow commands.    Assessment & Plan:  See Problem List

## 2014-05-19 NOTE — Assessment & Plan Note (Addendum)
Patient with a significant decline since hospitalization.  No recent fever to suggest infection. Likely worsened/impacted by decreased PO intake.  Obtaining CBC, CMP today. Patient to continue to follow with PT.

## 2014-05-20 ENCOUNTER — Ambulatory Visit
Admission: RE | Admit: 2014-05-20 | Discharge: 2014-05-20 | Disposition: A | Discharge status: Home / Self Care | Payer: Medicaid Other | Source: Ambulatory Visit | Attending: Family Medicine | Admitting: Family Medicine

## 2014-05-20 DIAGNOSIS — R638 Other symptoms and signs concerning food and fluid intake: Secondary | ICD-10-CM

## 2014-05-20 DIAGNOSIS — R634 Abnormal weight loss: Secondary | ICD-10-CM

## 2014-05-22 ENCOUNTER — Telehealth: Payer: Self-pay | Admitting: Family Medicine

## 2014-05-22 NOTE — Telephone Encounter (Signed)
Forward to Dr. Adriana Simasook who had seen patient for this

## 2014-05-22 NOTE — Telephone Encounter (Signed)
Mother called and would like the results of the lab results and imaging results for Michele [name]. jw

## 2014-05-24 NOTE — Telephone Encounter (Signed)
Labs revealed elevated sodium reflective of poor hydration. Swallowing study was negative. I do not have an etiology of her symptoms. She needs to follow up with Dr. Paulina FusiHess ASAP

## 2014-05-26 ENCOUNTER — Ambulatory Visit: Payer: Medicaid Other

## 2014-05-26 NOTE — Telephone Encounter (Signed)
LVM for patient's mother to call back to inform her of below 

## 2014-05-27 NOTE — Telephone Encounter (Signed)
pts mom returning call, would like to be called on her cell at [phone]

## 2014-06-02 ENCOUNTER — Encounter: Payer: Self-pay | Admitting: Family Medicine

## 2014-06-02 ENCOUNTER — Ambulatory Visit: Payer: Medicaid Other

## 2014-06-02 ENCOUNTER — Ambulatory Visit (INDEPENDENT_AMBULATORY_CARE_PROVIDER_SITE_OTHER): Payer: Medicaid Other | Admitting: Family Medicine

## 2014-06-02 VITALS — BP 119/84 | HR 93 | Temp 97.6°F | Ht <= 58 in

## 2014-06-02 DIAGNOSIS — E870 Hyperosmolality and hypernatremia: Secondary | ICD-10-CM

## 2014-06-02 DIAGNOSIS — E038 Other specified hypothyroidism: Secondary | ICD-10-CM

## 2014-06-02 LAB — TSH: TSH: 3.754 u[IU]/mL (ref 0.350–4.500)

## 2014-06-02 LAB — BASIC METABOLIC PANEL
BUN: 18 mg/dL (ref 6–23)
CALCIUM: 10.2 mg/dL (ref 8.4–10.5)
CO2: 28 meq/L (ref 19–32)
Chloride: 105 mEq/L (ref 96–112)
Creat: 0.63 mg/dL (ref 0.50–1.10)
Glucose, Bld: 100 mg/dL — ABNORMAL HIGH (ref 70–99)
Potassium: 3.9 mEq/L (ref 3.5–5.3)
SODIUM: 144 meq/L (ref 135–145)

## 2014-06-02 LAB — T4, FREE: FREE T4: 0.94 ng/dL (ref 0.80–1.80)

## 2014-06-02 NOTE — Assessment & Plan Note (Signed)
Check TSH and T4 today due to previous Hx of hypothyroidism and possible subclinical hypothyroidism from hospital stay - May need to restart Synthroid?  Unsure why she was taken off of this in the past

## 2014-06-02 NOTE — Progress Notes (Signed)
   Subjective:    Patient ID: Ernest PineRobin C Naser, female    DOB: [birth year], 28 y.o.   MRN: [id number]  HPI 28 year old female with MR, CP, and seizure disorder presents to the clinic today accompanied by her mother.  Here for f/u due to decreased PO intake, hypernatremia 2/2 to this,  hair loss  1) Hair Loss/Lethargy - Improving since last visit but still not at baseline.  Denies fever, chills, sweats.  Previously on Synthroid due to hypothyroidism but taken off this about 2-3 years ago for unknown reasons.   3) Hypernatremia/Decreased PO intake - improving since last visit.  Not at baseline but her PO intake has improved.  Good UOP/Stool.  No abdominal pain, fever, chills, sweats, travel.  Denies irritability or twitching.   Review of Systems Per HPI    Objective:   Physical Exam Filed Vitals:   06/02/14 1039  BP: 119/84  Pulse: 93  Temp: 97.6 F (36.4 C)   Exam: General:  sitting in wheelchair; NAD.  HEENT: NCAT. MMM Cardiovascular: RRR. No murmurs, rubs, or gallops. Respiratory: CTAB Abdomen: soft, nontender, nondistended.  Neuro:  Awake and alert. Decreased function/tone of LLE. Unable to assess strength as patient cannot follow commands.    Assessment & Plan:  See Problem List

## 2014-06-02 NOTE — Assessment & Plan Note (Signed)
2/2 to decreased PO intake  - Recheck BMET today - Functional status improving - If still not at baseline in 3-4 weeks, may need to discuss with Peds Neuro for further w/u.

## 2014-06-04 ENCOUNTER — Other Ambulatory Visit: Payer: Self-pay | Admitting: Family Medicine

## 2014-06-05 ENCOUNTER — Other Ambulatory Visit: Payer: Self-pay | Admitting: *Deleted

## 2014-06-05 MED ORDER — PANTOPRAZOLE SODIUM 40 MG PO PACK: 20.0000 mg | PACK | Freq: Every day | ORAL | Status: DC

## 2014-06-17 ENCOUNTER — Encounter: Payer: Self-pay | Admitting: Family Medicine

## 2014-06-17 NOTE — Progress Notes (Unsigned)
Dr. Jennette KettleNeal wrote letter and [facility] at [phone]. Lamonte SakaiZimmerman Rumple, April D

## 2014-06-17 NOTE — Progress Notes (Unsigned)
Michele MelterLashawn with [facility]/pts care coordinator called to see if this letter of medical necessity can be completed today? Needs it by the 1st of the year.

## 2014-06-17 NOTE — Progress Notes (Unsigned)
Patient needs [facility] for [facility] at fax [phone] saying that it is medically necessary.  She needs this right away.

## 2014-06-22 ENCOUNTER — Telehealth: Payer: Self-pay | Admitting: Family Medicine

## 2014-06-22 NOTE — Telephone Encounter (Signed)
Michele [name] called again, she will fax the medical necessity letter to have Dr Jennette Kettle sign and fax back She needs this completed today.

## 2014-06-22 NOTE — Telephone Encounter (Signed)
Completed, ready for fax.  Twana First Paulina Fusi, DO of [facility] 06/22/2014, 11:24 AM

## 2014-06-22 NOTE — Telephone Encounter (Signed)
Home Health Nurse called because the letter written by Dr. Jennette Kettle on 06/17/14 needs to be changed to. To Whom it may concern. This also has to have a signature of the doctor on the paper. They need this faxed to [phone] and they said they needs this before 12. jw

## 2014-06-28 ENCOUNTER — Other Ambulatory Visit: Payer: Self-pay | Admitting: Family Medicine

## 2014-07-06 ENCOUNTER — Telehealth: Payer: Self-pay | Admitting: Family

## 2014-07-06 NOTE — Telephone Encounter (Signed)
Misty [facility]Lisa with [facility] left message saying that Medicaid is denying order for oxygen because it is for seizures and not respiratory illness. She asks for a letter documenting why she needs the oxygen for  emergencies with seizures. Please fax to [facility] @ [phone], ph [phone] ext [phone]. Letter written and sent to Dr Sharene SkeansHickling for signature. TG

## 2014-07-06 NOTE — Telephone Encounter (Signed)
Signed.

## 2014-07-08 ENCOUNTER — Ambulatory Visit (INDEPENDENT_AMBULATORY_CARE_PROVIDER_SITE_OTHER): Payer: Medicaid Other | Admitting: Family

## 2014-07-08 ENCOUNTER — Encounter: Payer: Self-pay | Admitting: Family

## 2014-07-08 VITALS — BP 124/70 | HR 84 | Ht <= 58 in | Wt 175.0 lb

## 2014-07-08 DIAGNOSIS — G808 Other cerebral palsy: Secondary | ICD-10-CM

## 2014-07-08 DIAGNOSIS — G40311 Generalized idiopathic epilepsy and epileptic syndromes, intractable, with status epilepticus: Secondary | ICD-10-CM

## 2014-07-08 DIAGNOSIS — G40319 Generalized idiopathic epilepsy and epileptic syndromes, intractable, without status epilepticus: Secondary | ICD-10-CM

## 2014-07-08 DIAGNOSIS — F73 Profound intellectual disabilities: Secondary | ICD-10-CM

## 2014-07-08 NOTE — Patient Instructions (Signed)
I have given Michele [name] an order for physical therapy.  Let me know if you need anything else for this.   Continue her medications without change for now. Let me know if she has any seizures.   Please plan to return for follow up in 6 months or sooner if needed.

## 2014-07-08 NOTE — Progress Notes (Signed)
Patient: Michele [name] MRN: [id number] Sex: female DOB: 1985/12/15  Provider: Elveria Rising, NP Location of Care: [facility]  Note type: Routine return visit  History of Present Illness: Referral Source: Dr. Asher [name] History from: her mother Chief Complaint: Seizures  Michele [name] is a 29 y.o. 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 She was last seen April 16, 2014. Michele [name] had seizures as a child that were associated with high fever. She has quadriparesis and is dependent upon others for activities of daily living. Her mother calls between visits to report seizures. When Michele [name] was last seen, she had experienced a hospitalization for dehydration. Her mother tells me today that Michele [name] has been well since last seen and has not had further hospitalizations.   Michele [name] is taking and tolerating Depakote, Keppra, Topamax and Banzel for her seizure disorder. She has had no seizures since December 2015, when she had a 2 minute seizure while visiting family in [place]. Mom feels that Michele [name] was intolerant of the noise and activity going on that the family visit, and that is what prompted the seizure.   Today Mom remains concerned because Michele [name] is now non-ambulatory. She used to be able to stand and take a few steps with assistance, but now she has to be lifted for transfers. Mom says that Michele [name] is no longer able to feed herself, but that she was once able to do so. Michele [name] still claps her hands together and fidgets with her fingers but is less purposeful than she was in the past. Mom says that when she does grasp that her grip is diminished. Physical and occupational therapy was ordered at her last visit and her mother tells me that insurance would not cover it in 2015 and that she needs a new order for physical therapy in order for services to begin.   Review of Systems: 12 system review was remarkable for unable to walk  Past  Medical History  Diagnosis Date  . Cerebral palsy   . Lennox-Gastaut syndrome   . Profound mental retardation   . Osteoporosis   . Rickets   . Hypothyroidism   . Seizures   . Reflux    Hospitalizations: Yes.  , Head Injury: No., Nervous System Infections: No., Immunizations up to date: Yes.   Past Medical History Comments: see Hx.  Surgical History Past Surgical History  Procedure Laterality Date  . Peg tube placement    . Gastrocutaneous fistual repair  2012    Family History family history includes Cancer in her maternal aunt; Diabetes in an other family member; Heart disease in her maternal grandmother and another family member; Hypertension in her mother and paternal grandmother; Kidney disease in an other family member; Stroke in her maternal grandfather. There is no history of Colon cancer. Family History is otherwise negative for migraines, seizures, cognitive impairment, blindness, deafness, birth defects, chromosomal disorder, autism.  Social History History   Social History  . Marital Status: Single    Spouse Name: N/A    Number of Children: 0  . Years of Education: N/A   Occupational History  . Disabled     Social History Main Topics  . Smoking status: Never Smoker   . Smokeless tobacco: Never Used  . Alcohol Use: No  . Drug Use: No  . Sexual Activity: No   Other Topics Concern  . None   Social History Narrative   0 caffeine drinks    Lives  with mother who helps ADLs.   Graduated from [employer] in 2010. Now attends day program at [employer].    Educational level: special education School Attending:N/A Living with:  mother and sister  Hobbies/Interest: none School comments:  Michele [name] is currently attending [employer] day program.  Physical Exam BP 124/70 mmHg  Pulse 84  Ht 4\' 9"  (1.448 m)  Wt 175 lb (79.379 kg)  BMI 37.86 kg/m2  LMP 07/04/2014 (Exact Date) General: well developed, well nourished female, seated in a wheelchair, in no  evident distress  Head: head normocephalic and atraumatic.  Neck: supple with no carotid or supraclavicular bruits.  Respiratory: clear to auscultation  Cardiovascular: regular rate and rhythm, no murmurs. Her hands, lower legs and feet are edematous but less so than her last visit.  Musculoskeletal: no obvious deformities of her limbs, but it is difficult to assess due to edema and adipose tissue covering landmarks on her legs and feet. Her knees and ankles are barely perceptible even when palpated. Skin: She has a surgical scar on her abdomen. She has a scar on her forehead. She has no upper front teeth. She has no areas of pressure or broken skin. She has no rashes or lesions.   Neurologic Exam  Mental Status: Awake and fully alert. She smiles responsively and laughs spontaneously. She claps her hands repeatedly. She has significant mental retardation. She has no language but occasionally makes vocalizations. Her mother says that she can say "Michele [name]", her mother's nickname for her, but that word is not intelligible to me. She is unable to follow any commands.  Cranial Nerves: Fundoscopic exam reveals sharp disc margins. Pupils equal, briskly reactive to light. Extraocular movements appeared to be intact. Visual fields appeared full. Turns to localize sounds. Face, tongue, palate move normally and symmetrically. Neck flexion and extension normal.  Motor: She has mild quadriparesis. She has increased tone, particularly in her legs.  Sensory: Withdrawal x 4.  Coordination: She had fairly good fine motor movements with her hands. She could not follow instructions for adequate coordination testing. She did do well with hand clapping but I could not get her to do purposeful grabbing today. Gait and Station: She is wheelchair bound. I did not get her out of her chair today. Reflexes: Diminished and symmetric. It was difficult to assess reflexes with her edema. I could not get plantar reflex  response.    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 She is dependent upon others for all activities of daily living. Bedelia's last seizure occurred in December while [place]. I made no changes in her plan of care today. She will continue on her medications without change. Michele [name] is deconditioned and unable to stand and bear weight, which she used to be able to do to help with transfers. I gave her mother a new order for  physical therapy as that service was denied after her last visit. Her mother believes that it will be approved at this time. I asked Mom to continue to notify me about seizures, and I will see Taite back in follow up in 6 months or sooner if needed. Mom agreed with these plans.

## 2014-07-09 ENCOUNTER — Ambulatory Visit: Payer: Medicaid Other | Attending: Family

## 2014-07-09 DIAGNOSIS — Z5189 Encounter for other specified aftercare: Secondary | ICD-10-CM | POA: Insufficient documentation

## 2014-07-09 DIAGNOSIS — G809 Cerebral palsy, unspecified: Secondary | ICD-10-CM | POA: Insufficient documentation

## 2014-07-09 DIAGNOSIS — R279 Unspecified lack of coordination: Secondary | ICD-10-CM | POA: Diagnosis not present

## 2014-07-09 DIAGNOSIS — R5381 Other malaise: Secondary | ICD-10-CM | POA: Insufficient documentation

## 2014-07-09 DIAGNOSIS — R262 Difficulty in walking, not elsewhere classified: Secondary | ICD-10-CM

## 2014-07-09 NOTE — Therapy (Addendum)
[facility] [address] [place], [place], [address] Phone: [phone]   Fax:  [phone]  Physical Therapy Evaluation  Patient Details  Name: Michele PineRobin C Sellitto MRN: [id number] Date of Birth: April 08, 1986 Referring Provider:  Briscoe DeutscherHess, Bryan R, DO  Encounter Date: 07/09/2014      PT End of Session - 07/09/14 1333    Visit Number 1   Number of Visits 9   Date for PT Re-Evaluation 08/08/14   Authorization Type Awaiting Medicaid authorization   PT Start Time [id number]   PT Stop Time 1007   PT Time Calculation (min) 28 min   Equipment Utilized During Treatment Gait belt   Activity Tolerance Patient limited by fatigue;Patient limited by lethargy      Past Medical History  Diagnosis Date  . Cerebral palsy   . Lennox-Gastaut syndrome   . Profound mental retardation   . Osteoporosis   . Rickets   . Hypothyroidism   . Seizures   . Reflux     Past Surgical History  Procedure Laterality Date  . Peg tube placement    . Gastrocutaneous fistual repair  2012    LMP 07/04/2014 (Exact Date)  Visit Diagnosis:  Difficulty walking - Plan: PT plan of care cert/re-cert  Lack of coordination - Plan: PT plan of care cert/re-cert  Physical debility - Plan: PT plan of care cert/re-cert      Subjective Assessment - 07/09/14 0946    Symptoms Difficulty walking, generalized weakness   Pertinent History Seizures, intellectual disability since birth,    Patient Stated Goals For pt to be able to walk again, and transfer with less assist.   Currently in Pain? Other (Comment)  pt non-verbal but appears calm and pt's mom says that pt will cry if in pain.          [facility] PT Assessment - 07/09/14 0947    Assessment   Medical Diagnosis Cerebral Palsy, seizures   Onset Date 03/19/14   Precautions   Precautions Other (comment);Fall  seizures   Precaution Comments no electrical stimulation   Restrictions   Weight Bearing Restrictions No    Balance Screen   Has the patient fallen in the past 6 months Yes   How many times? --  pt's mom is unsure   Has the patient had a decrease in activity level because of a fear of falling?  No  however, pt's mom is fearful of pt falling   Is the patient reluctant to leave their home because of a fear of falling?  No   Home Environment   Living Enviornment Private residence   Research officer, trade unionLiving Arrangements Parent;Other relatives   Available Help at Discharge Family   Type of Home House   Home Access Ramped entrance   Home Layout One level   Home Equipment Walker - 4 wheels;Wheelchair - manual   Prior Function   Level of Independence Needs assistance with ADLs;Needs assistance with transfers  pt required assistance with walking   Cognition   Overall Cognitive Status Difficult to assess  pt is non-verbal   Difficult to assess due to Level of arousal;Impaired communication   Observation/Other Assessments   Observations pt lacks oral control (drooling), and trunk control. Pt requires a belt to keep her trunk supported against the wheelchair    Sensation   Light Touch --  unable to assess due to impaired cognition   Posture/Postural Control   Posture/Postural Control Postural limitations   Postural Limitations Rounded Shoulders;Forward head;Increased  thoracic kyphosis;Posterior pelvic tilt;Flexed trunk   PROM   Overall PROM  Within functional limits for tasks performed   Strength   Overall Strength Unable to assess;Due to impaired cognition   Overall Strength Comments Pt was able to actively perform R shoulder flexion and R knee extension while moving in the wheelchair, but not upon command. Pt required +2 assist to perform sit<>stand due to impaired cognition and decreased B LE strength.   Transfers   Transfers Sit to Stand;Stand to Sit   Sit to Stand 1: +2 Total assist   Sit to Stand: Patient Percentage 20%   Sit to Stand Details (indicate cue type and reason) Sit<>stand performed x2, with  +2 assist.   Ambulation/Gait   Ambulation/Gait No   Wheelchair Mobility   Wheelchair Mobility No   Balance   Balance Assessed Yes   Static Sitting Balance   Static Sitting - Balance Support No upper extremity supported   Static Sitting - Level of Assistance 5: Stand by assistance;Other (comment)  min guard   Static Sitting - Comment/# of Minutes Pt able to sit statically without UE support for 90 seconds, and an addtional 30 seconds with 1 UE support.   Dynamic Sitting Balance   Dynamic Sitting - Balance Support --  unable to perform   Static Standing Balance   Static Standing - Balance Support No upper extremity supported   Static Standing - Level of Assistance 1: +2 Total assist   Static Standing - Comment/# of Minutes Pt able to stand for 5-15 seconds x2 with +2 assist for safety (pt assisted approx. 20%).                          PT Education - 07/09/14 1331    Education provided Yes   Education Details PT educated pt's mom on performing static sitting balance activites, reaching outside BOS, performing sit<>stands, placing objects outside BOS to encourage pt to use B LEs to push w/c.   Person(s) Educated Patient;Parent(s)   Methods Explanation;Demonstration   Comprehension Verbalized understanding          PT Short Term Goals - 07/09/14 1343    PT SHORT TERM GOAL #1   Title See LTGs.           PT Long Term Goals - 07/09/14 1339    PT LONG TERM GOAL #1   Title Family will assist pt with HEP improve trunk control and sitting balance (progressing to standing balance if appropriate). Target date: 08/08/14)   Baseline Family unaware of exercises to help pt   Time --  3 visits or 4 weeks (if pt is approved for more than 3 visits)   Status New   PT LONG TERM GOAL #2   Title Pt will perform sit<>stand transfers with MAX A of 1 person only to improve functional mobility. Target date: 08/06/14.   Baseline 2 person dependent transfers   Time --  3 visits  or 4 weeks  (if pt is approved for more than 3 visits)   Status New   PT LONG TERM GOAL #3   Title Stand pivot transfer toward one direction with MAX A of 1 person to decrease caregiver burden. Target date: 08/06/14.   Baseline 2 person dependent tranfsers   Time --  3 visits or 4 weeks (if pt is approved for more than 3 visits)   Status New   PT LONG TERM GOAL #4   Title Pt  will ambulate 10' in parallel bars or LRAD with MAX A of 1 person to improve functional mobility. Target date: 08/06/14.   Baseline non-ambulatory   Time --  3 visits or 4 weeks (if pt is approved for more than 3 visits)   Status New   PT LONG TERM GOAL #5   Title Pt will sit without UE support with supervision with feet on the floor and without back support x 5 mintues without loss of balance. Target date: 08/06/14.   Baseline Can sit 60 seconds without UE support.   Time --  3 visits or 4 weeks (if pt is approved for more than 3 visits)   Status New               Plan - 07/09/14 1334    Clinical Impression Statement Pt is a 29 y/o female presenting to [facility] neuro unable to walk, assistance for transfers, and unable to feed herself. Pt was seen at [facility] neuro last 04/2014 for one eval. Pt's mom, [name], present during session and provided history as pt is unable to communicate verbally. Pt's mom reported that pt last ambulated in 03/2014, approx. 75'. Pt's mom reported pt did better after last PT session in 04/2014, and is now able to move B LEs "a little more".    Pt will benefit from skilled therapeutic intervention in order to improve on the following deficits Decreased activity tolerance;Decreased balance;Decreased endurance;Decreased coordination;Decreased mobility;Other (comment);Difficulty walking  dependent for transfers   Rehab Potential Fair   Clinical Impairments Affecting Rehab Potential intellectual disability, unable to follow commands   PT Frequency 2x / week   PT Duration 4 weeks   PT  Treatment/Interventions Gait training;Functional mobility training;Therapeutic activities;Balance training;Neuromuscular re-education;Therapeutic exercise;Patient/family education;Wheelchair mobility training   PT Next Visit Plan family training and home exercise program   Recommended Other Services OT to address pt's inability to feed herself.   Consulted and Agree with Plan of Care Patient         Problem List Patient Active Problem List   Diagnosis Date Noted  . Hypernatremia 06/02/2014  . Amenorrhea 05/19/2014  . 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 Long-term use of high-risk medication 12/24/2012  . Morbid obesity 12/24/2012  . Congenital quadriplegia 12/24/2012  . URINARY INCONTINENCE 02/25/2010  . 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 Hypothyroidism 01/29/2009  . MENTAL RETARDATION, PROFOUND 01/29/2009  . Infantile cerebral palsy 01/29/2009  . GASTROESOPHAGEAL REFLUX DISEASE 01/29/2009  . OSTEOPOROSIS 01/29/2009  . Seizure disorder 01/29/2009    Marionna Gonia L 07/09/2014, 1:47 PM  [facility] [address] [place], [place], [address] Phone: [phone]   Fax:  [phone]    Zerita Boers, PT,DPT 07/09/2014 1:47 PM Phone: [phone] Fax: [phone]

## 2014-07-20 ENCOUNTER — Encounter: Payer: Self-pay | Admitting: Family Medicine

## 2014-07-20 ENCOUNTER — Ambulatory Visit (INDEPENDENT_AMBULATORY_CARE_PROVIDER_SITE_OTHER): Payer: Medicaid Other | Admitting: Family Medicine

## 2014-07-20 VITALS — BP 120/73 | HR 123 | Temp 97.7°F

## 2014-07-20 DIAGNOSIS — Z1151 Encounter for screening for human papillomavirus (HPV): Secondary | ICD-10-CM

## 2014-07-20 DIAGNOSIS — Z1159 Encounter for screening for other viral diseases: Secondary | ICD-10-CM | POA: Insufficient documentation

## 2014-07-20 NOTE — Assessment & Plan Note (Signed)
Discussed likelihood and chance of her being HPV positive w/o sexual intercourse or activity.  Due to her ongoing MR/CP, profound, and difficulty obtaining PAP along with very low likelihood of having HPV + or cervical dysplasia, mother has opted to not have this done.  Discussed risks and benefits of screening and agree this is probably overzealous.  F/U PRN

## 2014-07-20 NOTE — Progress Notes (Signed)
   Subjective:    Patient ID: Michele [name], female    DOB: Sep 05, 1985, 29 y.o.   MRN: [id number]  HPI 29 year old female with MR, CP, and seizure disorder presents to the clinic today accompanied by her mother.  Here for f/u of ongoing medical issues and also was told by state department to have pap smear performed.  MR/CP - Doing well, increased PO intake, about back at baseline.  Going to [facility] for [facility]  Review of Systems Per HPI    Objective:   Physical Exam Filed Vitals:   07/20/14 1043  BP: 120/73  Pulse: 123  Temp: 97.7 F (36.5 C)   Exam: General:  sitting in wheelchair; NAD.  HEENT: NCAT. MMM Cardiovascular: RRR. No murmurs, rubs, or gallops. Respiratory: CTAB Abdomen: soft, nontender, nondistended.  Neuro:  Awake and alert.  Unable to assess strength as patient cannot follow commands.    Assessment & Plan:  See Problem List

## 2014-08-04 ENCOUNTER — Encounter: Payer: Self-pay | Admitting: Family Medicine

## 2014-08-04 NOTE — Progress Notes (Signed)
Pt mother came in and is asking Dr. Paulina FusiHess to provide an itemized physician order so that pt can receive an easily accessible shower for her safety. When it is available to be picked up please contact [name] (the pt mother) at [phone] when it is available to be picked up. If there is no answer you can leave a message. Thanks, Sadie R.

## 2014-08-05 NOTE — Progress Notes (Signed)
Has this been taken care of or what exactly do I need to fill out?  Thanks [employer]ryan

## 2014-08-06 ENCOUNTER — Ambulatory Visit: Payer: Medicaid Other | Attending: Family

## 2014-08-06 DIAGNOSIS — R5381 Other malaise: Secondary | ICD-10-CM | POA: Insufficient documentation

## 2014-08-06 DIAGNOSIS — G809 Cerebral palsy, unspecified: Secondary | ICD-10-CM | POA: Insufficient documentation

## 2014-08-06 DIAGNOSIS — R279 Unspecified lack of coordination: Secondary | ICD-10-CM | POA: Diagnosis not present

## 2014-08-06 DIAGNOSIS — Z5189 Encounter for other specified aftercare: Secondary | ICD-10-CM | POA: Diagnosis present

## 2014-08-06 NOTE — Therapy (Signed)
[facility] [address] [place], [place], [address] Phone: [phone]   Fax:  [phone]  Physical Therapy Evaluation  Patient Details  Name: Michele [name] MRN: [id number] Date of Birth: [birth year] Referring Provider:  Briscoe Deutscher, DO  Encounter Date: 08/06/2014    Past Medical History  Diagnosis Date  . Cerebral palsy   . Lennox-Gastaut syndrome   . Profound mental retardation   . Osteoporosis   . Rickets   . Hypothyroidism   . Seizures   . Reflux     Past Surgical History  Procedure Laterality Date  . Peg tube placement    . Gastrocutaneous fistual repair  2012    LMP 07/04/2014 (Exact Date)  Visit Diagnosis:  Physical debility      Subjective Assessment - 08/06/14 0814    Symptoms Pt's mother is requesting a letter of medical necessity for revisions to bathroom to make it more accessible for her daughter's wheelchair.   Patient Stated Goals Letter of medical necessity   Currently in Pain? Other (Comment)  pt does not appear to be in pain, no grimace.        Letter of Medical Necessity  Patient Name:  Michele [name]       Date of Birth:  November 27, 1985 Diagnosis:  Cerebral palsy, Lennox-Gastaut syndrome, seizures  Onset:  Birth  Referring Physician:  Ellison Carwin, MD/[name] Blane Ohara, NP  Date of Evaluation:  08/06/14 Case Manager/Care Coordinator:  Delfina Redwood; [phone]  email: [email or web address]    To Whom It May Concern:  Michele [name] is a 29 year old female with Cerebral Palsy, Lennox-Gastaut syndrome and seizure disorder.  She is not ambulatory and requires total assistance with all mobility and transfers. Pt has a manual wheelchair which is pushed by her mother.  Khylie currently lives in a home with her mother. The home has a ramped entrance which allows for wheelchair access to the front door. Pt was previously able to walk short distances, but in October 2015 she had a  decline in function, and was hospitalized October 14-20th. She is now no longer able to ambulate at all, and her ability to stand is very limited. At times Dejana is resistant to standing creating physical strain on her mother as well as increased risk of falling. Her wheelchair doesn't fit into the bathroom, nor does her Michiel Sites lift and she is no longer able to safely stand in her shower or transfer into her shower and is now only able to receive bed baths.  Due to this decline in function, her mother presents today with a request for revisions to be made to her home to allow for wheelchair accessibility to the bathroom.  The following revisions are needed with justification as indicated: Marland Kitchen Handheld shower head: This will allow pt's mother to access all areas of pt's body for thorough cleaning/rinsing. . *Roll in shower chair with opening in seat for toileting: Because pt does not consistently assist with sit to stands or stand pivot transfers, and frequently resists these transfers, pt will benefit from this type of shower chair for improved safety with decreased risk of falls and decreased risk of injury to patient and mother. In addition, this will significantly decrease caregiver burden. Elwyn Lade [facility] [phone]": The size of this shower base will allow for adequate space for a roll in shower chair and for pt's mom or an attendant to assist with bathing. Also, the fact that it is barrier free, will  allow for increased ease and safety of transfers/rolling in so there is no threshold to negotiate. Conservation officer, historic buildings. Folding Shower Seat 18x16": The design of this seat is space saving, so if pt is able to roll a chair into the shower, the seat can remain against the wall to create space for increased maneuvering in the shower, or it can provide a space for the patient or caregiver to sit while completing bathing. The placement of the seat on the wall in a folded position also prevents risk of collision with the seat  when it is not in use, decreasing the risk of falls. [place]. Widen door to 32": This will allow for pt's wheelchair or a roll in shower chair, or her mechanical lift to fit through the doorway. This is absolutely necessary for pt to be able to take showers as it is impossible for pt to access bathroom at this time with her non-ambulatory status. She is currently only receiving bed baths. This decreases her level of hygiene and increases risk of infection or other illness. . New Vinyl Flooring throughout bath: Because making the bathroom wheelchair accessible requires a full remodel, the current flooring must be removed, and the entire floorplan will be different; thus new flooring is required. Vinyl is the least expensive option.  Quote for wheelchair accessible bathroom including necessary labor for demolition: $10,980*  Quote information provided by [employer]  *does not include roll-shower chair  **As I am unable to visit or observe pt in her home environment, all information contained in this letter is based on family members' explanation of home setting.  Please contact me at [phone] with any questions and/or concerns you may have regarding this information.  Thank you for your assistance with this matter.  Sincerely,      Lamar LaundryJennifer Tysha Grismore, PT,DPT,NCS                              Problem List Patient Active Problem List   Diagnosis Date Noted  . Special screening examination for human papillomavirus (HPV) 07/20/2014  . Amenorrhea 05/19/2014  . 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 Long-term use of high-risk medication 12/24/2012  . Morbid obesity 12/24/2012  . Congenital quadriplegia 12/24/2012  . URINARY INCONTINENCE 02/25/2010  . 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 Hypothyroidism 01/29/2009  . MENTAL RETARDATION, PROFOUND 01/29/2009  . Infantile  cerebral palsy 01/29/2009  . GASTROESOPHAGEAL REFLUX DISEASE 01/29/2009  . OSTEOPOROSIS 01/29/2009  . Seizure disorder 01/29/2009   Lamar LaundryJennifer Shashwat Cleary, PT,DPT,NCS 08/06/2014 5:39 PM Phone [phone] FAX [phone]         [facility] [address] [place], [place], [address] Phone: [phone]   Fax:  [phone]

## 2014-08-25 ENCOUNTER — Other Ambulatory Visit: Payer: Self-pay | Admitting: Family

## 2014-08-31 ENCOUNTER — Telehealth: Payer: Self-pay

## 2014-08-31 DIAGNOSIS — G40319 Generalized idiopathic epilepsy and epileptic syndromes, intractable, without status epilepticus: Secondary | ICD-10-CM

## 2014-08-31 MED ORDER — DEPAKOTE SPRINKLES 125 MG PO CPSP: ORAL_CAPSULE | ORAL | Status: DC

## 2014-08-31 NOTE — Telephone Encounter (Signed)
Mom called and said that pharmacy misplaced Rx for Keppra that we sent to them on 08-25-14. She asked that we refax this. She also needs a refill on Depakote Sprinkles 125 mg caps 8 po bid, BMN. I confirmed pharmacy with mom and told her to check with them in a little while for the refills.

## 2014-08-31 NOTE — Telephone Encounter (Signed)
I refaxed the Keppra Rx as requested and sent the Depakote Rx as well. TG

## 2014-08-31 NOTE — Telephone Encounter (Signed)
[name], mom, lvm I could not make out what she was saying, something about a prescription. I tried calling her back at both numbers and reached [facility]. I lvm asking mother to call me and clarify what she is needing.

## 2014-09-07 ENCOUNTER — Other Ambulatory Visit: Payer: Self-pay | Admitting: Family

## 2014-09-07 DIAGNOSIS — G808 Other cerebral palsy: Secondary | ICD-10-CM

## 2014-09-07 DIAGNOSIS — F73 Profound intellectual disabilities: Secondary | ICD-10-CM

## 2014-09-07 DIAGNOSIS — G40319 Generalized idiopathic epilepsy and epileptic syndromes, intractable, without status epilepticus: Secondary | ICD-10-CM

## 2014-09-07 DIAGNOSIS — Z79899 Other long term (current) drug therapy: Secondary | ICD-10-CM

## 2014-09-24 ENCOUNTER — Other Ambulatory Visit: Payer: Self-pay | Admitting: Family

## 2014-09-25 ENCOUNTER — Telehealth: Payer: Self-pay

## 2014-09-25 MED ORDER — BANZEL 40 MG/ML PO SUSP: ORAL | Status: DC

## 2014-09-25 NOTE — Telephone Encounter (Signed)
[facility] lvm requesting refill for pt's Banzel 40mg /mL 5 mLs po bid. Their call back number is: [phone].

## 2014-09-25 NOTE — Telephone Encounter (Signed)
[facility]. TG

## 2014-11-04 ENCOUNTER — Encounter: Payer: Self-pay | Admitting: Family Medicine

## 2014-11-04 NOTE — Progress Notes (Unsigned)
FMLA forms dropped off to be completed.  Please call her when ready to be picked up.

## 2014-11-04 NOTE — Progress Notes (Unsigned)
Forms placed in provider box. Lamonte SakaiZimmerman Rumple, Chesky Heyer D, [place]

## 2014-11-06 NOTE — Progress Notes (Signed)
I am covering for Dr. Hess who is away from the office.  Will defer completion of this form to him when he returns as he is more knowledgeable of patient's condition.  Brittany J McIntyre, MD  

## 2014-11-10 ENCOUNTER — Telehealth: Payer: Self-pay | Admitting: Family

## 2014-11-10 ENCOUNTER — Telehealth: Payer: Self-pay | Admitting: *Deleted

## 2014-11-10 NOTE — Telephone Encounter (Signed)
I left a message for Mom that the FMLA form and letter that she requested was ready for pick up. TG

## 2014-11-10 NOTE — Telephone Encounter (Signed)
Spoke with pt mother and informed her that the forms she had dropped off were complete and would be up front with her daughters name on them. Lamonte SakaiZimmerman Rumple, April D, [place]

## 2014-11-24 ENCOUNTER — Other Ambulatory Visit: Payer: Self-pay | Admitting: Family

## 2014-12-13 ENCOUNTER — Other Ambulatory Visit: Payer: Self-pay | Admitting: Family Medicine

## 2014-12-14 NOTE — Telephone Encounter (Signed)
Mother called and wanted to know how much longer it will be before the Fosamax will be filled. jw

## 2015-01-06 ENCOUNTER — Encounter: Payer: Self-pay | Admitting: Family

## 2015-01-06 ENCOUNTER — Ambulatory Visit (INDEPENDENT_AMBULATORY_CARE_PROVIDER_SITE_OTHER): Payer: Medicaid Other | Admitting: Family

## 2015-01-06 VITALS — BP 128/76 | HR 80 | Ht <= 58 in | Wt 175.0 lb

## 2015-01-06 DIAGNOSIS — F73 Profound intellectual disabilities: Secondary | ICD-10-CM | POA: Diagnosis not present

## 2015-01-06 DIAGNOSIS — G808 Other cerebral palsy: Secondary | ICD-10-CM | POA: Diagnosis not present

## 2015-01-06 DIAGNOSIS — G40311 Generalized idiopathic epilepsy and epileptic syndromes, intractable, with status epilepticus: Secondary | ICD-10-CM

## 2015-01-06 DIAGNOSIS — G40319 Generalized idiopathic epilepsy and epileptic syndromes, intractable, without status epilepticus: Secondary | ICD-10-CM

## 2015-01-06 MED ORDER — BANZEL 40 MG/ML PO SUSP: ORAL | Status: DC

## 2015-01-06 MED ORDER — CARNITOR 1 GM/10ML PO SOLN: ORAL | Status: DC

## 2015-01-06 MED ORDER — KEPPRA 100 MG/ML PO SOLN: ORAL | Status: DC

## 2015-01-06 MED ORDER — DEPAKOTE SPRINKLES 125 MG PO CSDR: DELAYED_RELEASE_CAPSULE | ORAL | Status: DC

## 2015-01-06 MED ORDER — TOPAMAX SPRINKLE 25 MG PO CPSP: ORAL_CAPSULE | ORAL | Status: DC

## 2015-01-06 NOTE — Progress Notes (Signed)
Patient: Michele [name] MRN: [id number] Sex: female DOB: [birth year]  Provider: Elveria Rising, NP Location of Care: [facility]  Note type: Routine return visit  History of Present Illness: Referral Source: Dr. Asher Muir History from: mother and [facility] chart Chief Complaint: Seizures  Michele [name] is a 29 y.o. 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 She was last seen July 08, 2014. Michele [name] had seizures as a child that were associated with high fever. She has quadriparesis and is dependent upon others for activities of daily living. Her mother calls between visits to report seizures. When Michele [name] was last seen, she had experienced a hospitalization for dehydration. Her mother tells me today that Michele [name] had one seizure since she was last seen. It occurred last month and lasted 2-3 minutes. Mom gave her rectal Diastat and the seizure stopped. Mom said that the seizure occurred when Michele [name] was somewhat overheated due to the hot weather. Michele [name] is taking and tolerating Depakote, Keppra, Topamax and Banzel for her seizure disorder.  Since she was last seen, Michele [name] started receiving physical therapy once per week at [facility]. Mom showed me video of her taking some steps with a walker. Mom has been doing home exercises with Michele [name] since she started therapy. Also since she was last seen, Michele [name] received funding to have the bathrooom remodeled to be handicapped accessible. Mom said that work was completed and has made it much easier for her to care for Michele [name].   Michele [name] has been otherwise healthy since last seen and Mom has no other health concerns today other than previously mentioned.  Review of Systems: Please see the HPI for neurologic and other pertinent review of systems. Otherwise, the following systems are noncontributory including constitutional, eyes, ears, nose and throat, cardiovascular, respiratory, gastrointestinal,  genitourinary, musculoskeletal, skin, endocrine, hematologic/lymph, allergic/immunologic and psychiatric.   Past Medical History  Diagnosis Date  . Cerebral palsy   . Lennox-Gastaut syndrome   . Profound mental retardation   . Osteoporosis   . Rickets   . Hypothyroidism   . Seizures   . Reflux    Hospitalizations: No., Head Injury: No., Nervous System Infections: No., Immunizations up to date: Yes.   Past Medical History Comments: See history  Surgical History Past Surgical History  Procedure Laterality Date  . Peg tube placement    . Gastrocutaneous fistual repair  2012    Family History family history includes Cancer in her maternal aunt; Diabetes in an other family member; Heart disease in her maternal grandmother and another family member; Hypertension in her mother and paternal grandmother; Kidney disease in an other family member; Stroke in her maternal grandfather. There is no history of Colon cancer. Family History is otherwise negative for migraines, seizures, cognitive impairment, blindness, deafness, birth defects, chromosomal disorder, autism.  Social History History   Social History  . Marital Status: Single    Spouse Name: N/A  . Number of Children: 0  . Years of Education: N/A   Occupational History  . Disabled     Social History Main Topics  . Smoking status: Never Smoker   . Smokeless tobacco: Never Used  . Alcohol Use: No  . Drug Use: No  . Sexual Activity: No   Other Topics Concern  . None   Social History Narrative   0 caffeine drinks    Lives with mother who helps ADLs.   Graduated from [employer] in 2010. Now attends day program at [employer].    Educational levelEvent organiser: Graduated [employer]   School Attending:[employer] Living with:  mother and sibling  Hobbies/Interest: Michele BallRobin enjoys going to her day program, she enjoys tearing up paper, playing in water, and trying to walk. School comments:  Michele BallRobin does very well in her  day program.  Allergies Allergies  Allergen Reactions  . Carbamazepine Rash    Physical Exam BP 128/76 mmHg  Pulse 80  Ht 4\' 9"  (1.448 m)  Wt 175 lb (79.379 kg)  BMI 37.86 kg/m2  LMP 12/07/2014 (Approximate) General: well developed, well nourished female, seated in a wheelchair, in no evident distress  Head: head normocephalic and atraumatic.  Neck: supple with no carotid or supraclavicular bruits.  Respiratory: clear to auscultation  Cardiovascular: regular rate and rhythm, no murmurs. Her hands, lower legs and feet are edematous but less so than in previous visits.   Musculoskeletal: no obvious deformities of her limbs, but it is difficult to assess due to edema and adipose tissue covering landmarks on her legs and feet.  Skin: She has a surgical scar on her abdomen. She has a scar on her forehead. She has no upper front teeth. She has no areas of pressure or broken skin. She has no rashes or lesions.   Neurologic Exam  Mental Status: Awake and fully alert. She smiles responsively and laughs spontaneously. She claps her hands repeatedly. She has significant mental retardation. She has no language but occasionally makes vocalizations. Her mother says that she can say "Robbie", her mother's nickname for her, but that word is not intelligible to me. She is unable to follow any commands.  Cranial Nerves: Fundoscopic exam reveals sharp disc margins. Pupils equal, briskly reactive to light. Extraocular movements appeared to be intact. Visual fields appeared full. Turns to localize sounds. Face, tongue, palate move normally and symmetrically. Neck flexion and extension normal.  Motor: She has mild quadriparesis. She has increased tone, particularly in her legs.  Sensory: Withdrawal x 4.  Coordination: She had fairly good fine motor movements with her hands. She could not follow instructions for adequate coordination testing. She did do well with hand clapping but I could not get her  to do purposeful grabbing today. Gait and Station: She is wheelchair bound. I did not get her out of her chair today. Reflexes: Diminished and symmetric. It was difficult to assess reflexes with her edema. I could not get plantar reflex response.   Impression 1. 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 Congenital quadriplegia 4. Significant intellectual disability   Recommendations for plan of care [facility]. Michele BallRobin has neither had nor required imaging or lab studies since the last visit. 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 She is dependent upon others for all activities of daily living. Ying's last seizure occurred in June in the setting of being slightly overheated due to weather. I made no changes in her plan of care today. She will continue on her medications without change for now. Michele BallRobin has been receiving physical therapy once per week and she should continue this as long as feasible. I asked Mom to continue to notify me about seizures.  I will see Adalie back in follow up in 6 months or sooner if needed. Mom agreed with these plans.   The medication list was reviewed and reconciled.  No changes were made in  the prescribed medications today.  A complete medication list was provided to the patient's mother.  Total time spent with the patient was 20 minutes, of which 50% or more was spent in counseling and coordination of care.

## 2015-01-06 NOTE — Patient Instructions (Signed)
Please continue to call me when Michele [name] has a seizure.   I have refilled her medications today, and have given you a note for her to return to the day program.   Continue doing home exercises with Michele [name] and taking her to physical therapy weekly as long as it is feasible.   Please plan to return for follow up in 6 months or sooner if needed.

## 2015-01-25 ENCOUNTER — Other Ambulatory Visit: Payer: Self-pay | Admitting: Family

## 2015-01-25 DIAGNOSIS — G40319 Generalized idiopathic epilepsy and epileptic syndromes, intractable, without status epilepticus: Secondary | ICD-10-CM

## 2015-01-25 MED ORDER — TOPAMAX SPRINKLE 25 MG PO CPSP: ORAL_CAPSULE | ORAL | Status: DC

## 2015-03-08 ENCOUNTER — Ambulatory Visit (INDEPENDENT_AMBULATORY_CARE_PROVIDER_SITE_OTHER): Payer: Medicaid Other | Admitting: *Deleted

## 2015-03-08 DIAGNOSIS — Z23 Encounter for immunization: Secondary | ICD-10-CM | POA: Diagnosis not present

## 2015-05-17 ENCOUNTER — Other Ambulatory Visit: Payer: Self-pay | Admitting: *Deleted

## 2015-05-17 MED ORDER — LACTULOSE 10 GM/15ML PO SOLN: ORAL | Status: DC

## 2015-05-19 ENCOUNTER — Encounter: Payer: Self-pay | Admitting: Family

## 2015-06-03 ENCOUNTER — Other Ambulatory Visit: Payer: Self-pay | Admitting: Family

## 2015-06-15 ENCOUNTER — Other Ambulatory Visit: Payer: Self-pay | Admitting: Family

## 2015-06-16 ENCOUNTER — Telehealth: Payer: Self-pay | Admitting: Family

## 2015-06-16 NOTE — Telephone Encounter (Signed)
Mom Michele [name] sent me a note requesting a letter for her employer to explain why she needs to provide care for Michele [name]. I put the letter at the front desk for [name]'s mother and called her to let her know. TG

## 2015-06-18 ENCOUNTER — Other Ambulatory Visit: Payer: Self-pay | Admitting: Family Medicine

## 2015-06-18 ENCOUNTER — Telehealth: Payer: Self-pay | Admitting: Family Medicine

## 2015-06-18 DIAGNOSIS — K219 Gastro-esophageal reflux disease without esophagitis: Secondary | ICD-10-CM

## 2015-06-18 MED ORDER — PANTOPRAZOLE SODIUM 40 MG PO PACK: 20.0000 mg | PACK | Freq: Every day | ORAL | Status: DC

## 2015-06-18 NOTE — Telephone Encounter (Signed)
DOne

## 2015-06-18 NOTE — Telephone Encounter (Signed)
Patient mother informed. She wanted me to let PCP know that Michele [name] has been having some cold symptoms (cough and nasal congestion) over the past couple days and if it gets any worse she will be taking her to urgent care over the weekend. FYI to PCP

## 2015-06-18 NOTE — Telephone Encounter (Signed)
Refill request for Protonix. Pt completely out and pharamacy states request was sent 3x. (No evidence of this in EPIC.)

## 2015-06-21 ENCOUNTER — Emergency Department (HOSPITAL_COMMUNITY)
Admission: EM | Admit: 2015-06-21 | Discharge: 2015-06-21 | Discharge status: Absent without leave | Payer: Medicaid Other | Source: Home / Self Care

## 2015-06-23 ENCOUNTER — Ambulatory Visit (INDEPENDENT_AMBULATORY_CARE_PROVIDER_SITE_OTHER): Payer: Medicaid Other | Admitting: Family Medicine

## 2015-06-23 ENCOUNTER — Encounter: Payer: Self-pay | Admitting: Family Medicine

## 2015-06-23 VITALS — BP 106/68 | HR 82 | Temp 96.9°F | Wt 150.4 lb

## 2015-06-23 DIAGNOSIS — J310 Chronic rhinitis: Secondary | ICD-10-CM | POA: Insufficient documentation

## 2015-06-23 MED ORDER — GUAIFENESIN ER 600 MG PO TB12: 600.0000 mg | ORAL_TABLET | Freq: Two times a day (BID) | ORAL | Status: DC

## 2015-06-23 MED ORDER — FLUTICASONE PROPIONATE 50 MCG/ACT NA SUSP: 2.0000 | Freq: Every day | NASAL | Status: DC

## 2015-06-23 NOTE — Progress Notes (Signed)
Patient ID: Michele PineRobin C [name], female   DOB: 19-Dec-1985, 30 y.o.   MRN: [id number]    Subjective: CC: chest congestion HPI: Patient is a 30 y.o. female with a past medical history of congenital quadriplegia, epilepsy, and infantile cerebral palsy presenting to clinic today for chest congestion.  Mother notes that the patient started having a non-productive cough and had poor appetite starting 2 weeks ago. Approximately 1 week ago she started having nasal drainage that mother notes is green. She feels like now when the patient coughs, she can hear some "stuff in her chest." No complaints of ear pain.  Her sleep is a little off as the cough is most prominent during the night. She has never had a fever. No sneezing or water eyes.  Bowel movements are normal.  No sick contacts.   Patient received Tussin cough medicine 15mL and Motrin (because mom felt the patient may be in pain from coughing) without any improvement. She has not taken any medication today.   Mother is giving the patient Boost through a syringe because she wasn't eating well- she notes she does this when she gets sick. Notes she's been able to stay well hydrated with the syringe feedings.   Social History: no smoke exposure   Health Maintenance: up to date on flu vaccine.   ROS: All other systems reviewed and are negative.  Past Medical History Patient Active Problem List   Diagnosis Date Noted  . Rhinitis, non-allergic 06/23/2015  . Special screening examination for human papillomavirus (HPV) 07/20/2014  . Amenorrhea 05/19/2014  . 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 Long-term use of high-risk medication 12/24/2012  . Morbid obesity (HCC) 12/24/2012  . 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 URINARY INCONTINENCE 02/25/2010  . 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 Hypothyroidism 01/29/2009  . Profound intellectual disabilities  01/29/2009  . Infantile cerebral palsy (HCC) 01/29/2009  . GASTROESOPHAGEAL REFLUX DISEASE 01/29/2009  . OSTEOPOROSIS 01/29/2009  . Seizure disorder (HCC) 01/29/2009    Medications- reviewed and updated Current Outpatient Prescriptions  Medication Sig Dispense Refill  . alendronate (FOSAMAX) 70 MG tablet Take 70 mg by mouth once a week. Take on Saturdays with a full glass of water on an empty stomach.    Marland Kitchen. alendronate (FOSAMAX) 70 MG tablet TAKE 1 TABLET BY MOUTH EVERY 7 DAYS ON SATURDAY WIT FULL GLASS OF WATER ON AN EMPTY STOMACH 12 tablet 3  . BANZEL 40 MG/ML SUSP Give Jerzy 5ml by mouth in the morning and 5ml by mouth at night. 340 mL 5  . Calcium Carbonate-Vitamin D (CALCARB 600/D) 600-400 MG-UNIT per tablet Take 1 tablet by mouth 2 (two) times daily. 1 tab per tube 2 time a day    . CARNITOR 1 GM/10ML solution Give 5ml by mouth 3 times per day 510 mL 5  . clotrimazole (LOTRIMIN) 1 % cream Apply 1 application topically 2 (two) times daily.    Marland Kitchen. DEPAKOTE SPRINKLES 125 MG capsule Take 8 capsules twice per day 496 capsule 5  . DIASTAT ACUDIAL 20 MG GEL DIAL TO 15MG , GIVE RECTALLY FOR SEIZURES LASTING 2 MINUTES OR LONGER 2 Package 1  . fluticasone (FLONASE) 50 MCG/ACT nasal spray Place 2 sprays into both nostrils daily. 16 g 6  . folic acid (FOLVITE) 1 MG tablet Take 1 mg by mouth every morning. 1 tab per tube daily    . furosemide (LASIX) 20 MG tablet Take 1 tablet (20  mg total) by mouth daily as needed. 30 tablet 3  . guaiFENesin (MUCINEX) 600 MG 12 hr tablet Take 1 tablet (600 mg total) by mouth 2 (two) times daily. 20 tablet 0  . KEPPRA 100 MG/ML solution TAKE BY MOUTH EVERY MORNING AND EVERY EVENING 816 mL 5  . lactulose (CHRONULAC) 10 GM/15ML solution TAKE 30 ML BY MOUTH TWICE DAILY 240 mL 11  . ondansetron (ZOFRAN) 4 MG/5ML solution TAKE 5 MLS FOR PAIN EVERY 8 HOURS AS NEEDED FOR NAUSEA OR VOMITING 50 mL 11  . pantoprazole sodium (PROTONIX) 40 mg/20 mL PACK Place 10 mLs (20  mg total) into feeding tube daily. 30 each 11  . TOPAMAX SPRINKLE 25 MG capsule TAKE 6 CAPSULES BY MOUTH IN THE MORNING AND 6 CAPSULES IN THE EVENING WITH FOOD 408 capsule 5   No current facility-administered medications for this visit.    Objective: Office vital signs reviewed. BP 106/68 mmHg  Pulse 82  Temp(Src) 96.9 F (36.1 C) (Axillary)  Wt 150 lb 6.4 oz (68.221 kg)   Physical Examination:  General: Awake, alert, sitting up in wheelchair, making eye contact, not speaking (baseline for patient) ENMT:  TMs intact, normal light reflex, no erythema, no bulging. Significant dry crusted nasal discharge that is clear/yellow in color with some active clear drainage. MMM, Oropharynx clear without erythema or tonsillar exudate/hypertrophy Eyes: Conjunctiva non-injected. PERRL.  Cardio: RRR, no m/r/g noted. 2+ radial pulses Pulm: No increased WOB.  CTAB, without wheezes, rhonchi or crackles noted.  Skin: dry, intact, no rashes or lesions  Assessment/Plan: Rhinitis, non-allergic Given the patient's age and symptoms (nasal drainage/congestion and cough without sneezing or watery eyes), this is most likely secondary to non-allergic rhinitis. No evidence of infection on exam or history (lungs clear without increased WOB and TMs non-bulging and no fever). Temperature slightly low however it is axillary, unable to obtain oral temperature due to patient's refusal.  - Discussed staying well hydrated - No fever therefore not likely infectious, wrote note so patient may return to day care. - Nasal saline with bulb suctioning as needed, then Flonase twice daily.  - Mucinex BID for the cough. - RTC precautions: fever, refusal to eat, increased somnolence, SOB, or failure for symptoms to improve.      No orders of the defined types were placed in this encounter.    Meds ordered this encounter  Medications  . fluticasone (FLONASE) 50 MCG/ACT nasal spray    Sig: Place 2 sprays into both nostrils  daily.    Dispense:  16 g    Refill:  6  . guaiFENesin (MUCINEX) 600 MG 12 hr tablet    Sig: Take 1 tablet (600 mg total) by mouth 2 (two) times daily.    Dispense:  20 tablet    Refill:  0    Joanna Puff PGY-2, [facility]

## 2015-06-23 NOTE — Patient Instructions (Signed)
It was nice meeting you. I did hear any infection in the patient's lungs and did not see any infection in her ears.   For the nasal congestion, initially use nasal saline (that you can buy over the counter) and then gently bulb suction this out. Once her nose is more cleared up, start using Flonase which will help clear up the drainage.  I have also prescribed her something for the cough and chest congestion. She should take this twice daily for the next 2 days, then as needed after that.   If she has a fever, develops new symptoms, or you are unable to get her to eat, please come back and have her evaluated.

## 2015-06-23 NOTE — Assessment & Plan Note (Signed)
Given the patient's age and symptoms (nasal drainage/congestion and cough without sneezing or watery eyes), this is most likely secondary to non-allergic rhinitis. No evidence of infection on exam or history (lungs clear without increased WOB and TMs non-bulging and no fever). Temperature slightly low however it is axillary, unable to obtain oral temperature due to patient's refusal.  - Discussed staying well hydrated - No fever therefore not likely infectious, wrote note so patient may return to day care. - Nasal saline with bulb suctioning as needed, then Flonase twice daily.  - Mucinex BID for the cough. - RTC precautions: fever, refusal to eat, increased somnolence, SOB, or failure for symptoms to improve.

## 2015-07-12 ENCOUNTER — Ambulatory Visit: Payer: Medicaid Other | Admitting: Family

## 2015-07-12 ENCOUNTER — Ambulatory Visit (INDEPENDENT_AMBULATORY_CARE_PROVIDER_SITE_OTHER): Payer: Medicaid Other | Admitting: Family

## 2015-07-12 ENCOUNTER — Encounter: Payer: Self-pay | Admitting: Family

## 2015-07-12 VITALS — BP 122/68 | HR 72 | Ht 59.0 in | Wt 152.0 lb

## 2015-07-12 DIAGNOSIS — G808 Other cerebral palsy: Secondary | ICD-10-CM

## 2015-07-12 DIAGNOSIS — G40319 Generalized idiopathic epilepsy and epileptic syndromes, intractable, without status epilepticus: Secondary | ICD-10-CM | POA: Diagnosis not present

## 2015-07-12 DIAGNOSIS — F73 Profound intellectual disabilities: Secondary | ICD-10-CM

## 2015-07-12 MED ORDER — DEPAKOTE SPRINKLES 125 MG PO CSDR: DELAYED_RELEASE_CAPSULE | ORAL | Status: DC

## 2015-07-12 MED ORDER — DIASTAT ACUDIAL 20 MG RE GEL: RECTAL | Status: DC

## 2015-07-12 MED ORDER — KEPPRA 100 MG/ML PO SOLN: ORAL | Status: DC

## 2015-07-12 MED ORDER — CARNITOR 1 GM/10ML PO SOLN: ORAL | Status: DC

## 2015-07-12 MED ORDER — BANZEL 40 MG/ML PO SUSP: ORAL | Status: DC

## 2015-07-12 MED ORDER — TOPAMAX SPRINKLE 25 MG PO CPSP: ORAL_CAPSULE | ORAL | Status: DC

## 2015-07-12 NOTE — Progress Notes (Signed)
Patient: Michele [name] MRN: [id number] Sex: female DOB: [birth year]  Provider: Elveria Rising, NP Location of Care: [facility]  Note type: Routine return visit  History of Present Illness: Referral Source: Asher Muir, MD History from: mother and [facility] chart Chief Complaint: Seizures  RUDI KNIPPENBERG is a 30 y.o. 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 She was last seen January 06, 2015. Sumner had seizures as a child that were associated with high fever. She has quadriparesis and is dependent upon others for activities of daily living. Her mother calls between visits to report seizures. When Michele [name] was last seen, she had experienced a hospitalization for dehydration. Her mother tells me today that Michele [name] had one seizure since she was last seen. Mom said that it was brief and occurred about a month go. Michele [name] is taking and tolerating Depakote, Keppra, Topamax and Banzel for her seizure disorder.  Mom also reports today that Michele [name] "graduated" from Physical Therapy, and has been walking better since receiving therapy. Mom uses a gait belt when she walks, because Michele [name] tends to fall easily. Pansie is unable to get up from her chair and begin walking independently, but requires assistance to do so. Once up and walking, she does fairly well, but trips and tires easily.   Mom also reports that Michele [name] has been having menstrual cycles, which had stopped for a period of time for reasons that are not clear.   Michele [name] has been otherwise healthy since last seen and Mom has no other health concerns today other than previously mentioned. She is pleased with how Michele [name] is doing at this time.   Review of Systems: Please see the HPI for neurologic and other pertinent review of systems. Otherwise, the following systems are noncontributory including constitutional, eyes, ears, nose and throat, cardiovascular, respiratory, gastrointestinal,  genitourinary, musculoskeletal, skin, endocrine, hematologic/lymph, allergic/immunologic and psychiatric.   Past Medical History  Diagnosis Date  . Cerebral palsy (HCC)   . Lennox-Gastaut syndrome (HCC)   . Profound mental retardation   . Osteoporosis   . Rickets   . Hypothyroidism   . Seizures (HCC)   . Reflux    Hospitalizations: Yes.  , Head Injury: No., Nervous System Infections: No., Immunizations up to date: Yes.   Past Medical History Comments: See history  Surgical History Past Surgical History  Procedure Laterality Date  . Peg tube placement    . Gastrocutaneous fistual repair  2012    Family History family history includes Cancer in her maternal aunt; Heart disease in her maternal grandmother; Hypertension in her mother and paternal grandmother; Stroke in her maternal grandfather. There is no history of Colon cancer. Family History is otherwise negative for migraines, seizures, cognitive impairment, blindness, deafness, birth defects, chromosomal disorder, autism.  Social History Social History   Social History  . Marital Status: Single    Spouse Name: N/A  . Number of Children: 0  . Years of Education: N/A   Occupational History  . Disabled     Social History Main Topics  . Smoking status: Never Smoker   . Smokeless tobacco: Never Used  . Alcohol Use: No  . Drug Use: No  . Sexual Activity: No   Other Topics Concern  . None   Social History Narrative   0 caffeine drinks    Lives with mother who helps ADLs.   Graduated from [employer] in 2010. Now attends day program at [employer].    Enjoys playing  in water, ripping paper, watching TV, music and going out.    Allergies Allergies  Allergen Reactions  . Carbamazepine Rash    Physical Exam BP 122/68 mmHg  Pulse 72  Ht  (1.499 m)  Wt 152 lb (68.947 kg)  BMI 30.68 kg/m2 General: well developed, well nourished female, seated in a wheelchair, in no evident distress  Head: head normocephalic  and atraumatic.  Neck: supple with no carotid or supraclavicular bruits.  Respiratory: clear to auscultation  Cardiovascular: regular rate and rhythm, no murmurs. Her hands, lower legs and feet are edematous but less so than in previous visits.  Musculoskeletal: no obvious deformities of her limbs, but it is difficult to assess due to edema and adipose tissue covering landmarks on her legs and feet.  Skin: She has a surgical scar on her abdomen. She has a scar on her forehead. She has no upper front teeth. She has no areas of pressure or broken skin. She has no rashes or lesions.   Neurologic Exam  Mental Status: Awake and fully alert. She smiles responsively and laughs spontaneously. She claps her hands repeatedly. She has significant mental retardation. She has no language but occasionally makes vocalizations. Her mother says that she can say "Robbie", her mother's nickname for her, but that word is not intelligible to me. She is unable to follow any commands.  Cranial Nerves: Fundoscopic exam reveals sharp disc margins. Pupils equal, briskly reactive to light. Extraocular movements appeared to be intact. Visual fields appeared full. Turns to localize sounds. Face, tongue, palate move normally and symmetrically. Neck flexion and extension normal.  Motor: She has mild quadriparesis. She has increased tone, particularly in her legs.  Sensory: Withdrawal x 4.  Coordination: She had fairly good fine motor movements with her hands. She could not follow instructions for adequate coordination testing. She did do well with hand clapping but I could not get her to do purposeful grabbing today. Gait and Station: She is wheelchair bound. I did not get her out of her chair today. Reflexes: Diminished and symmetric. It was difficult to assess reflexes with her edema. I could not get plantar reflex response.    Impression 1. 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  Congenital quadriplegia 4. Significant intellectual disability  Recommendations for plan of care The patient's previous [facility] records were reviewed. Michele [name] has neither had nor required imaging or lab studies since the last visit. 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 She is dependent upon others for all activities of daily living. Michele [name] had one brief seizure in the last 6 months and I will make no changes in her plan of care today. She will continue on her medications without change for now. Michele [name] has been walking more since receiving Physical Therapy and I encouraged Mom to continue with the exercise plan for home.  I asked Mom to continue to notify me about seizures. I will see Michele [name] back in follow up in 6 months or sooner if needed. Mom agreed with these plans.   The medication list was reviewed and reconciled.  No changes were made in the prescribed medications today.  A complete medication list was provided to the patient/caregiver.  Total time spent with the patient was 20 minutes, of which 50% or more was spent in counseling and coordination of care.

## 2015-07-13 NOTE — Patient Instructions (Signed)
Continue giving Michele [name]'s medications as you have been doing. Let me know when she has seizures.   I am pleased that she has been walking better. Please continue with the exercises and walking at home as you have been doing.   I will see Viviana back in follow up in 6 months or sooner if needed.

## 2015-07-20 ENCOUNTER — Telehealth: Payer: Self-pay | Admitting: Family

## 2015-07-20 NOTE — Telephone Encounter (Signed)
Audery Amel with [facility] called to request a letter of medical necessity for the portable oxygen tank that has been supplied to Michele [name] for prolonged seizures. I wrote the letter as requested and faxed it to her at [phone]. TG

## 2015-07-29 ENCOUNTER — Emergency Department (HOSPITAL_COMMUNITY): Payer: Medicaid Other

## 2015-07-29 ENCOUNTER — Encounter (HOSPITAL_COMMUNITY): Payer: Self-pay

## 2015-07-29 ENCOUNTER — Emergency Department (HOSPITAL_COMMUNITY)
Admission: EM | Admit: 2015-07-29 | Discharge: 2015-07-29 | Disposition: A | Discharge status: Home / Self Care | Payer: Medicaid Other | Attending: Emergency Medicine | Admitting: Emergency Medicine

## 2015-07-29 DIAGNOSIS — Z79899 Other long term (current) drug therapy: Secondary | ICD-10-CM | POA: Diagnosis not present

## 2015-07-29 DIAGNOSIS — S93401A Sprain of unspecified ligament of right ankle, initial encounter: Secondary | ICD-10-CM | POA: Insufficient documentation

## 2015-07-29 DIAGNOSIS — Y9289 Other specified places as the place of occurrence of the external cause: Secondary | ICD-10-CM | POA: Insufficient documentation

## 2015-07-29 DIAGNOSIS — Y9389 Activity, other specified: Secondary | ICD-10-CM | POA: Insufficient documentation

## 2015-07-29 DIAGNOSIS — K219 Gastro-esophageal reflux disease without esophagitis: Secondary | ICD-10-CM | POA: Diagnosis not present

## 2015-07-29 DIAGNOSIS — W1839XA Other fall on same level, initial encounter: Secondary | ICD-10-CM | POA: Insufficient documentation

## 2015-07-29 DIAGNOSIS — Z8669 Personal history of other diseases of the nervous system and sense organs: Secondary | ICD-10-CM | POA: Insufficient documentation

## 2015-07-29 DIAGNOSIS — F70 Mild intellectual disabilities: Secondary | ICD-10-CM | POA: Diagnosis not present

## 2015-07-29 DIAGNOSIS — Z7951 Long term (current) use of inhaled steroids: Secondary | ICD-10-CM | POA: Insufficient documentation

## 2015-07-29 DIAGNOSIS — Z8639 Personal history of other endocrine, nutritional and metabolic disease: Secondary | ICD-10-CM | POA: Insufficient documentation

## 2015-07-29 DIAGNOSIS — M818 Other osteoporosis without current pathological fracture: Secondary | ICD-10-CM | POA: Insufficient documentation

## 2015-07-29 DIAGNOSIS — Y998 Other external cause status: Secondary | ICD-10-CM | POA: Insufficient documentation

## 2015-07-29 DIAGNOSIS — S99911A Unspecified injury of right ankle, initial encounter: Secondary | ICD-10-CM | POA: Diagnosis present

## 2015-07-29 NOTE — ED Notes (Signed)
Pt c/o R foot/ankle pain after a stumble and fall x 2 days ago.  Slight swelling noted.  Pt's mother reports she has been limping since the fall.  Hx of cerebral palsy and rickets.

## 2015-07-29 NOTE — ED Provider Notes (Signed)
CSN: [id number]     Arrival date & time 07/29/15  1234 History   First MD Initiated Contact with Patient 07/29/15 1252     Chief Complaint  Patient presents with  . Fall  . Foot Pain     (Consider location/radiation/quality/duration/timing/severity/associated sxs/prior Treatment) HPI Comments: Patient with past medical history remarkable for cerebral palsy, rickets, osteoporosis, presents emergency department with chief complaint of right ankle pain. She is accompanied by home health care worker. Reportedly, patient fell 2 days ago, possibly twisting her right ankle. There is mild swelling of the right foot and ankle. Patient ambulates with an antalgic gait. She has not taken anything for her symptoms. History provided by home health care worker. Level V caveat applies.  The history is provided by the patient. No language interpreter was used.    Past Medical History  Diagnosis Date  . Cerebral palsy (HCC)   . Lennox-Gastaut syndrome (HCC)   . Profound mental retardation   . Osteoporosis   . Rickets   . Hypothyroidism   . Seizures (HCC)   . Reflux    Past Surgical History  Procedure Laterality Date  . Peg tube placement    . Gastrocutaneous fistual repair  2012   Family History  Problem Relation Age of Onset  . Diabetes      MGGM  . Heart disease      MGGF  . Kidney disease      Maternal Great Aunt   . Colon cancer Neg Hx   . Hypertension Mother   . Cancer Maternal Aunt     lung  . Heart disease Maternal Grandmother     Died at 12  . Stroke Maternal Grandfather     died at 67  . Hypertension Paternal Grandmother    Social History  Substance Use Topics  . Smoking status: Never Smoker   . Smokeless tobacco: Never Used  . Alcohol Use: No   OB History    No data available     Review of Systems  Unable to perform ROS: Other (Mental retardation)      Allergies  Carbamazepine  Home Medications   Prior to Admission medications   Medication Sig Start Date  End Date Taking? Authorizing Provider  alendronate (FOSAMAX) 70 MG tablet Take 70 mg by mouth once a week. Take on Saturdays with a full glass of water on an empty stomach.    Historical Provider, MD  alendronate (FOSAMAX) 70 MG tablet TAKE 1 TABLET BY MOUTH EVERY 7 DAYS ON SATURDAY WIT FULL GLASS OF WATER ON AN EMPTY STOMACH 12/14/14   Briscoe Deutscher, DO  BANZEL 40 MG/ML SUSP Give Channel 5ml by mouth in the morning and 5ml by mouth at night. 07/12/15   Elveria Rising, NP  Calcium Carbonate-Vitamin D (CALCARB 600/D) 600-400 MG-UNIT per tablet Take 1 tablet by mouth 2 (two) times daily. 1 tab per tube 2 time a day    Historical Provider, MD  CARNITOR 1 GM/10ML solution Give 5ml by mouth 3 times per day 07/12/15   Elveria Rising, NP  clotrimazole (LOTRIMIN) 1 % cream Apply 1 application topically 2 (two) times daily.    Historical Provider, MD  DEPAKOTE SPRINKLES 125 MG capsule Take 8 capsules twice per day 07/12/15   Elveria Rising, NP  DIASTAT ACUDIAL 20 MG GEL DIAL TO , GIVE RECTALLY FOR SEIZURES LASTING 2 MINUTES OR LONGER 07/12/15   Elveria Rising, NP  fluticasone (FLONASE) 50 MCG/ACT nasal spray Place 2 sprays into  both nostrils daily. 06/23/15   Joanna Puff, MD  folic acid (FOLVITE) 1 MG tablet Take 1 mg by mouth every morning. 1 tab per tube daily    Historical Provider, MD  furosemide (LASIX) 20 MG tablet Take 1 tablet (20 mg total) by mouth daily as needed. 04/16/14   Bryan R Hess, DO  guaiFENesin (MUCINEX) 600 MG 12 hr tablet Take 1 tablet (600 mg total) by mouth 2 (two) times daily. 06/23/15   Joanna Puff, MD  KEPPRA 100 MG/ML solution TAKE BY MOUTH EVERY MORNING AND EVERY EVENING 07/12/15   Elveria Rising, NP  lactulose (CHRONULAC) 10 GM/15ML solution TAKE 30 ML BY MOUTH TWICE DAILY 05/17/15   Ashly M Gottschalk, DO  ondansetron (ZOFRAN) 4 MG/5ML solution TAKE 5 MLS FOR PAIN EVERY 8 HOURS AS NEEDED FOR NAUSEA OR VOMITING 06/29/14   Briscoe Deutscher, DO  pantoprazole sodium  (PROTONIX) 40 mg/20 mL PACK Place 10 mLs (20 mg total) into feeding tube daily. 06/18/15   Ashly M Gottschalk, DO  TOPAMAX SPRINKLE 25 MG capsule TAKE 6 CAPSULES BY MOUTH IN THE MORNING AND 6 CAPSULES IN THE EVENING WITH FOOD 07/12/15   Elveria Rising, NP   BP 107/71 mmHg  Pulse 76  Temp(Src) 97.4 F (36.3 C) (Axillary)  Resp 18  SpO2 97% Physical Exam  Constitutional: She is oriented to person, place, and time. She appears well-developed and well-nourished.  HENT:  Head: Normocephalic and atraumatic.  Eyes: Conjunctivae and EOM are normal.  Neck: Normal range of motion.  Cardiovascular: Normal rate and intact distal pulses.   Intact distal pulses Brisk cap refill  Pulmonary/Chest: Effort normal.  Abdominal: She exhibits no distension.  Musculoskeletal: Normal range of motion.  Right ankle moderately swollen, no tenderness to palpation, normal range of motion and strength, no bony abnormality or deformity  Neurological: She is alert and oriented to person, place, and time.  Skin: Skin is warm and dry. No rash noted.  Psychiatric: She has a normal mood and affect. Her behavior is normal. Judgment and thought content normal.  Nursing note and vitals reviewed.   ED Course  Procedures (including critical care time)  Imaging Review Dg Ankle Complete Right  07/29/2015  CLINICAL DATA:  Right foot and ankle pain after stumble and fall 2 days ago. EXAM: RIGHT ANKLE - COMPLETE 3+ VIEW COMPARISON:  Right foot film of July 29, 2015 FINDINGS: There is no evidence of fracture, dislocation, or joint effusion. There is no evidence of arthropathy or other focal bone abnormality. There is soft tissue swelling laterally. The fracture the base of the fourth metatarsal seen on right foot films not well appreciated on the ankle films. IMPRESSION: No acute fracture dislocation of the right ankle. Soft tissue swelling lateral ankle. Electronically Signed   By: Sherian Rein M.D.   On: 07/29/2015 13:59    Dg Foot Complete Right  07/29/2015  CLINICAL DATA:  Pain following fall 2 days prior EXAM: RIGHT FOOT COMPLETE - 3+ VIEW COMPARISON:  Right foot radiographs June 18, 2013; right foot CT June 25, 2013 FINDINGS: Frontal, oblique, and lateral views were obtained. There is mild generalized soft tissue swelling. No acute fracture or dislocation is evident. There is no appreciable joint space narrowing. There is mild hallux valgus deformity at the first MTP joint. There is a degree of pes planus. There is a benign appearing exostosis along the dorsal distal talus. IMPRESSION: Soft tissue swelling. No acute fracture or dislocation evident. Stable  benign-appearing exostosis along the dorsal distal talus. Pes planus. Mild hallux valgus deformity first MTP joint. Electronically Signed   By: Bretta Bang III M.D.   On: 07/29/2015 14:00   I have personally reviewed and evaluated these images and lab results as part of my medical decision-making.    MDM   Final diagnoses:  Ankle sprain, right, initial encounter    Patient with mechanical fall 2 days ago. Possibly rolled right ankle. Right ankle has been swollen. Patient now walks with a limp. Will check plain films, and reassess.  Plain films remarkable for soft tissue swelling, but no acute fracture. Will place patient in a cam walker. Recommend outpatient orthopedic follow-up.    Roxy Horseman, PA-C 07/29/15 1448  Raeford Razor, MD 07/31/15 [phone]

## 2015-07-29 NOTE — Discharge Instructions (Signed)

## 2015-08-04 ENCOUNTER — Other Ambulatory Visit: Payer: Self-pay | Admitting: Sports Medicine

## 2015-08-04 DIAGNOSIS — M25571 Pain in right ankle and joints of right foot: Secondary | ICD-10-CM

## 2015-08-09 ENCOUNTER — Ambulatory Visit
Admission: RE | Admit: 2015-08-09 | Discharge: 2015-08-09 | Disposition: A | Discharge status: Home / Self Care | Payer: Medicaid Other | Source: Ambulatory Visit | Attending: Sports Medicine | Admitting: Sports Medicine

## 2015-08-09 DIAGNOSIS — M25571 Pain in right ankle and joints of right foot: Secondary | ICD-10-CM

## 2015-08-10 ENCOUNTER — Other Ambulatory Visit: Payer: Self-pay | Admitting: Sports Medicine

## 2015-08-10 DIAGNOSIS — M25571 Pain in right ankle and joints of right foot: Secondary | ICD-10-CM

## 2015-09-15 ENCOUNTER — Ambulatory Visit (INDEPENDENT_AMBULATORY_CARE_PROVIDER_SITE_OTHER): Payer: Medicaid Other | Admitting: Family Medicine

## 2015-09-15 ENCOUNTER — Encounter: Payer: Self-pay | Admitting: Family Medicine

## 2015-09-15 VITALS — BP 102/40 | HR 104 | Temp 96.1°F

## 2015-09-15 DIAGNOSIS — B372 Candidiasis of skin and nail: Secondary | ICD-10-CM

## 2015-09-15 MED ORDER — NYSTATIN 100000 UNIT/GM EX POWD: CUTANEOUS | Status: DC

## 2015-09-15 NOTE — Assessment & Plan Note (Signed)
Exam consistent with intertriginous candidiasis Patient is at risk for this given obesity, incontinence, wearing diapers Advised mother to keep area clean and dry and change diapers more frequently Nystatin powder with every diaper change Return precautions discussed

## 2015-09-15 NOTE — Progress Notes (Signed)
   Subjective:   Michele [name] is a 30 y.o. female with a history of Intellectual disability, cerebral palsy, seizure disorder, morbid obesity, urinary and fecal incontinence here for possible yeast infection  Mother reports 1 month of worsening redness and irritation to inguinal creases in diaper area.  She reports that the patient had something similar before and a powder cleared it up.  She has tried vaseline and A&D ointment, but this hasnt helped.  The patient has not been scratching.  Denies fevers or other rashes.  Patient is incontinent of stool and urine and wears diapers regularly.  Review of Systems:  Per HPI.   Social History: never smoker  Objective:  BP 102/40 mmHg  Pulse 104  Temp(Src) 96.1 F (35.6 C) (Axillary)  Wt   Gen:  30 y.o. female in NAD HEENT: NCAT, MMM, EOMI, PERRL, anicteric sclerae Abd: Soft, NTND, BS present, no guarding or organomegaly GU: external genitalia wnl. Erythema in b/l inguinal creases without satellite lesions    Assessment & Plan:     Michele [name] is a 30 y.o. female here for intertriginous candidiasis  Intertriginous candidiasis Exam consistent with intertriginous candidiasis Patient is at risk for this given obesity, incontinence, wearing diapers Advised mother to keep area clean and dry and change diapers more frequently Nystatin powder with every diaper change Return precautions discussed     Erasmo DownerAngela M Chapman Matteucci, MD MPH PGY-2,  [facility] 09/15/2015  9:13 AM

## 2015-09-15 NOTE — Patient Instructions (Signed)
Cutaneous Candidiasis °Cutaneous candidiasis is a condition in which there is an overgrowth of yeast (candida) on the skin. Yeast normally live on the skin, but in small enough numbers not to cause any symptoms. In certain cases, increased growth of the yeast may cause an actual yeast infection. This kind of infection usually occurs in areas of the skin that are constantly warm and moist, such as the armpits or the groin. Yeast is the most common cause of diaper rash in babies and in people who cannot control their bowel movements (incontinence). °CAUSES  °The fungus that most often causes cutaneous candidiasis is Candida albicans. Conditions that can increase the risk of getting a yeast infection of the skin include: °· Obesity. °· Pregnancy. °· Diabetes. °· Taking antibiotic medicine. °· Taking birth control pills. °· Taking steroid medicines. °· Thyroid disease. °· An iron or zinc deficiency. °· Problems with the immune system. °SYMPTOMS  °· Red, swollen area of the skin. °· Bumps on the skin. °· Itchiness. °DIAGNOSIS  °The diagnosis of cutaneous candidiasis is usually based on its appearance. Light scrapings of the skin may also be taken and viewed under a microscope to identify the presence of yeast. °TREATMENT  °Antifungal creams may be applied to the infected skin. In severe cases, oral medicines may be needed.  °HOME CARE INSTRUCTIONS  °· Keep your skin clean and dry. °· Maintain a healthy weight. °· If you have diabetes, keep your blood sugar under control. °SEEK IMMEDIATE MEDICAL CARE IF: °· Your rash continues to spread despite treatment. °· You have a fever, chills, or abdominal pain. °  °This information is not intended to replace advice given to you by your health care provider. Make sure you discuss any questions you have with your health care provider. °  °Document Released: 02/21/2011 Document Revised: 08/28/2011 Document Reviewed: 12/07/2014 °Elsevier Interactive Patient Education ©2016 Elsevier  Inc. ° °

## 2015-09-28 ENCOUNTER — Telehealth: Payer: Self-pay | Admitting: Family

## 2015-09-28 ENCOUNTER — Telehealth: Payer: Self-pay | Admitting: Family Medicine

## 2015-09-28 NOTE — Telephone Encounter (Signed)
Mom [name] send me a note requesting a letter regarding Michele [name]'s condition to [place] the [place]. She said that Shiah's father passed away and that [name] is now eligible for VA benefits. I wrote the letter as requested and called her mother to let her know that it was ready to be picked up. TG

## 2015-09-28 NOTE — Telephone Encounter (Signed)
Mother is needing a letter written for the [place] concerning Michele [name]'s condition so she can receive benefits.  Her father has passed away.  See back of form for more detail.  Please call when ready to pick up.

## 2015-09-29 ENCOUNTER — Encounter: Payer: Self-pay | Admitting: Family Medicine

## 2015-09-29 NOTE — Telephone Encounter (Signed)
Done.  Placed up front for pick up.  Mother informed.

## 2015-09-29 NOTE — Telephone Encounter (Signed)
I did not find a document/form in my box.  I will cc white team to look into this.

## 2015-09-29 NOTE — Telephone Encounter (Signed)
Form Completion Request placed in PCP box.  See back side of form, there was no other form present with this. Lamonte SakaiZimmerman Rumple, Tylar Merendino D, [place]

## 2015-10-06 ENCOUNTER — Ambulatory Visit (INDEPENDENT_AMBULATORY_CARE_PROVIDER_SITE_OTHER): Payer: Medicaid Other | Admitting: Family Medicine

## 2015-10-06 VITALS — BP 82/26 | HR 133 | Temp 96.5°F | Ht 59.0 in | Wt 151.0 lb

## 2015-10-06 DIAGNOSIS — B372 Candidiasis of skin and nail: Secondary | ICD-10-CM

## 2015-10-06 MED ORDER — HYDROCORTISONE 0.5 % EX CREA: 1.0000 "application " | TOPICAL_CREAM | Freq: Two times a day (BID) | CUTANEOUS | Status: DC

## 2015-10-06 MED ORDER — CLOTRIMAZOLE 1 % EX CREA: 1.0000 "application " | TOPICAL_CREAM | Freq: Two times a day (BID) | CUTANEOUS | Status: DC

## 2015-10-06 NOTE — Progress Notes (Signed)
   Subjective: CC: diaper rash [id number]:Michele [name] is a 30 y.o. female presenting to clinic today for same day appointment. PCP: Delynn FlavinAshly Romelo Sciandra, DO Concerns today include:  1. Rash Mother notes that rash has been going on for over a month.  Mother notes that she had been having loose bowels when it began.  Mother notes that she has stopped giving lactulose to try and decrease irritation.  She is using dove soap.  Has been letting her have "air out time" since Friday.  No fevers, chills. No rash elsewhere on the body.   Social History Reviewed. FamHx and MedHx reviewed.  Please see EMR.  ROS: Per HPI  Objective: Office vital signs reviewed. BP 82/26 mmHg  Pulse 133  Temp(Src) 96.5 F (35.8 C) (Oral)  Wt 151 lb (68.493 kg)  Physical Examination:  General: Awake, alert, well nourished, No acute distress HEENT: Normal, MMM  MSK: uses wheelchair for mobility but able to stand with assistance Skin: shiny, erythematous rash involving the inguinal folds, extension to the perineum and gluteal folds.  Small areas of maceration.  No weeping or bleeding.  Assessment/ Plan: 30 y.o. female   1. Intertriginous candidiasis.  Does not seem to be relieved by Nystop.  Given duration, unlikely that this is isolated contact dermatitis.  NO satellite lesions appreciated but involved inguinal folds, which suggests fungal infection.  Since has been 4 weeks, will try to use topical antifungal cream and low dose topical steroid.   - Strict return precautions reviewed  - Continue to allow patient's diaper area to air out, continue to change diapers frequently. - Discussed applying creams to clean affected areas before other powders, etc. - If no improvement in the next 2 weeks, mother to bring patient in for reevaluation.  Could consider skin biopsy vs referral to derm at that time.  - clotrimazole (LOTRIMIN) 1 % cream; Apply 1 application topically 2 (two) times daily.  Dispense: 60 g; Refill: 2 -  hydrocortisone cream 0.5 %; Apply 1 application topically 2 (two) times daily.  Dispense: 60 g; Refill: 0 - Return in 2 weeks if no improvement  Raliegh IpAshly M Adaly Puder, DO PGY-2, [facility]

## 2015-10-06 NOTE — Patient Instructions (Signed)
I have sent in 2 topical creams.  The Clotrimazole is the antifungal and the Hydrocortisone cream is for inflammation.  Use these for the next 2 weeks.  If NO improvement, return for reevaluation.  Otherwise continue to use until the rash has been cleared for 3 days.    Intertrigo Intertrigo is a skin irritation (inflammation) that happens in warm, moist areas of the body. It happens mostly between folds of skin or where skin rubs together. HOME CARE  Keep the affected area cool and dry.  Leave the skin folds open to air.  Put cotton or linen between the folds of skin.  Avoid tight clothing.  Wear open-toed shoes or sandals.  Use powder on the affected area as told by your doctor.  Only use medicated creams or pastes as told by your doctor. GET HELP RIGHT AWAY IF:  The rash does not get better after 1 week of treatment.  The rash gets worse.  You have a fever or chills. MAKE SURE YOU:  Understand these instructions.  Will watch your condition.  Will get help right away if you are not doing well or get worse.   This information is not intended to replace advice given to you by your health care provider. Make sure you discuss any questions you have with your health care provider.   Document Released: 07/08/2010 Document Revised: 08/28/2011 Document Reviewed: 12/07/2014 Elsevier Interactive Patient Education [employer]2016 Elsevier Inc.

## 2015-11-08 ENCOUNTER — Telehealth: Payer: Self-pay | Admitting: Family Medicine

## 2015-11-08 NOTE — Telephone Encounter (Signed)
Patient's Mother asks PCP to complete FMLA Form. Please, follow up. °

## 2015-11-08 NOTE — Telephone Encounter (Signed)
Form put in PCP's box. Naileah Karg T Demetria Lightsey, CMA  

## 2015-11-09 NOTE — Telephone Encounter (Signed)
Called patient's mother at number in chart to clarify what exactly she needs FMLA for and for how long.  No answer.  Left voicemail to call clinic.  Please ask her this when she calls back.  Will attempt to fill out on Thursday when I am in clinic.

## 2015-11-10 NOTE — Telephone Encounter (Signed)
Left voice messages on home and cell phone that FMLA forms are complete and ready for pick up.  Forms copied for scanning in patient's record.  Clovis PuMartin, Thomasena Vandenheuvel L, RN

## 2015-11-10 NOTE — Telephone Encounter (Signed)
Done and placed in Michele [name]'s box for scanning and call to patient's mom.

## 2015-11-10 NOTE — Telephone Encounter (Signed)
Pt mom couldn't really tell me what she needed it filled out for. She stated that you could fill it out like dr hess did. She said she will bring an additional paper for you to fill out.Michele [name] Michele PotterBlount, CMA

## 2015-11-10 NOTE — Telephone Encounter (Signed)
The paperwork is FMLA paperwork for the mother.  She brought in the corrected forms and a phone note was done under her name

## 2015-11-17 ENCOUNTER — Telehealth: Payer: Self-pay | Admitting: Family

## 2015-11-17 DIAGNOSIS — G40319 Generalized idiopathic epilepsy and epileptic syndromes, intractable, without status epilepticus: Secondary | ICD-10-CM

## 2015-11-17 DIAGNOSIS — Z79899 Other long term (current) drug therapy: Secondary | ICD-10-CM

## 2015-11-17 NOTE — Telephone Encounter (Signed)
Mom [name] called and said that Michele [name] was having intermittent seizures involving eye deviation and twitching. She [facility]. I faxed a lab order and reminded Mom that Michele [name] needs to have blood drawn in the early morning before she has taken her medication. TG

## 2015-11-17 NOTE — Telephone Encounter (Signed)
Thank you :)

## 2015-11-18 LAB — CBC WITH DIFFERENTIAL/PLATELET
BASOS ABS: 0 {cells}/uL (ref 0–200)
BASOS PCT: 0 %
EOS ABS: 0 {cells}/uL — AB (ref 15–500)
Eosinophils Relative: 0 %
HCT: 38 % (ref 35.0–45.0)
Hemoglobin: 12 g/dL (ref 11.7–15.5)
LYMPHS PCT: 47 %
Lymphs Abs: 2585 cells/uL (ref 850–3900)
MCH: 32.5 pg (ref 27.0–33.0)
MCHC: 31.6 g/dL — ABNORMAL LOW (ref 32.0–36.0)
MCV: 103 fL — AB (ref 80.0–100.0)
MONOS PCT: 7 %
MPV: 12.3 fL (ref 7.5–12.5)
Monocytes Absolute: 385 cells/uL (ref 200–950)
Neutro Abs: 2530 cells/uL (ref 1500–7800)
Neutrophils Relative %: 46 %
PLATELETS: 156 10*3/uL (ref 140–400)
RBC: 3.69 MIL/uL — ABNORMAL LOW (ref 3.80–5.10)
RDW: 18.7 % — AB (ref 11.0–15.0)
WBC: 5.5 10*3/uL (ref 3.8–10.8)

## 2015-11-18 LAB — VALPROIC ACID LEVEL: Valproic Acid Lvl: 47.5 ug/mL — ABNORMAL LOW (ref 50.0–100.0)

## 2015-11-18 NOTE — Telephone Encounter (Signed)
I called Mom to let her know that the lab studies were still pending, and that I will call her back when the results are available. She said that Michele [name] was still having the brief seizures involving her eyes each day but that they were infrequent and lasted less than 20-30 seconds. She said that Michele [name] had not missed any doses of medication and has been otherwise well. Mom knows that she needs to be seen in the ER if the seizures become more frequent, are prolonged or if she has a convulsive seizure that lasts more than a 2-3 minutes. Mom is watching her closely and agrees with waiting for the lab results. TG

## 2015-11-19 ENCOUNTER — Telehealth: Payer: Self-pay | Admitting: Pediatrics

## 2015-11-19 LAB — ALT: ALT: 12 U/L (ref 6–29)

## 2015-11-19 NOTE — Telephone Encounter (Addendum)
-----   Message from Elveria Risingina Goodpasture, NP sent at 11/18/2015  3:21 PM EDT ----- Labs were drawn because Michele [name] has been having seizures with eye twitching and deviation. Mom denies any missed medications. I looked back in her labs and Michele [name] has had Depakote levels in the 40's in the past, as well as therapeutic levels. Her current dose is Depakote Sprinkles 8 capsules BID. She is also taking Banzel 5mL (200 mg) BID, Keppra 12mg  BID and Topamax Sprinkles 6 capsules BID.  Your thoughts? Michele [name] ----- Message -----    From: Lab in [facility] Interface    Sent: 11/18/2015  12:08 PM      To: Elveria Risingina Goodpasture, NP

## 2015-11-19 NOTE — Telephone Encounter (Signed)
She is on a huge dose of everything.  We will need to discuss this.  I called mother to let her know that we will call back on Monday.

## 2015-11-22 NOTE — Telephone Encounter (Signed)
Mom called me back and accepted an appointment Thurs November 25, 2015 @ 845AM, arrival time 8:30AM. TG

## 2015-11-22 NOTE — Telephone Encounter (Signed)
-----   Message from Elveria Risingina Goodpasture, NP sent at 11/22/2015  8:12 AM EDT ----- I looked back on the Depakote levels that were > 100 in the past - all were drawn in the hospital, from what I can tell, as a non-trough level.  Michele [name] ----- Message -----    From: Lab in [facility] Interface    Sent: 11/18/2015  12:08 PM      To: Elveria Risingina Goodpasture, NP

## 2015-11-22 NOTE — Telephone Encounter (Signed)
I left a message for Mom and asked her to call me back. TG 

## 2015-11-22 NOTE — Telephone Encounter (Signed)
We discussed bringing Michele [name] in to evaluate her which I think is a good idea; please call her mother.

## 2015-11-25 ENCOUNTER — Other Ambulatory Visit: Payer: Self-pay | Admitting: Family Medicine

## 2015-11-25 ENCOUNTER — Ambulatory Visit (INDEPENDENT_AMBULATORY_CARE_PROVIDER_SITE_OTHER): Payer: Medicaid Other | Admitting: Family

## 2015-11-25 ENCOUNTER — Encounter: Payer: Self-pay | Admitting: Family

## 2015-11-25 VITALS — BP 120/70 | HR 76 | Wt 151.0 lb

## 2015-11-25 DIAGNOSIS — H00019 Hordeolum externum unspecified eye, unspecified eyelid: Secondary | ICD-10-CM | POA: Insufficient documentation

## 2015-11-25 DIAGNOSIS — G808 Other cerebral palsy: Secondary | ICD-10-CM

## 2015-11-25 DIAGNOSIS — H00016 Hordeolum externum left eye, unspecified eyelid: Secondary | ICD-10-CM

## 2015-11-25 DIAGNOSIS — G40319 Generalized idiopathic epilepsy and epileptic syndromes, intractable, without status epilepticus: Secondary | ICD-10-CM | POA: Diagnosis not present

## 2015-11-25 DIAGNOSIS — F73 Profound intellectual disabilities: Secondary | ICD-10-CM | POA: Diagnosis not present

## 2015-11-25 NOTE — Progress Notes (Signed)
Patient: Michele [name] MRN: [id number] Sex: female DOB: 05-Oct-1985  Provider: Elveria Risingina Reisha Wos, NP [facility]  Note type: Urgent return visit  History of Present Illness: Referral Source: Asher MuirSusan Overstreet, MD [facility] Chief Complaint: Epilepsy  Michele PineRobin C Mcdonnell is a 30 y.o. 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 She was last seen July 12, 2015. Michele BallRobin had seizures as a child that were associated with high fever. She has quadriparesis and is dependent upon others for activities of daily living. Her mother calls between visits to report seizures. Michele BallRobin is taking and tolerating Depakote, Keppra, Topamax and Banzel for her seizure disorder. Michele BallRobin is being seen today because her mother called on Nov 17, 2015 to report that Michele BallRobin was experiencing eye twitching and deviation. Mom described the behavior as a repeated exaggerated wink of her left eye. She had blood drawn that revealed a Depakote level of 47.775mcg/ml with a CBC and ALT within normal limits. I asked her mother to bring her in today so that the symptoms could be better evaluated. Mom tells me today that after our last phone conversation, Michele BallRobin developed a stye on her left eye, and that the episodes of eye twitching diminished when the stye began resolving. She has not exhibited any convulsive behavior.  Michele BallRobin has been otherwise healthy since last seen and Mom has no other health concerns today other than previously mentioned. She is pleased with how Michele BallRobin is doing at this time.  Review of Systems: Please see the HPI for neurologic and other pertinent review of systems. Otherwise, the following systems are noncontributory including constitutional, eyes, ears, nose and throat, cardiovascular, respiratory, gastrointestinal, genitourinary, musculoskeletal, skin, endocrine, hematologic/lymph, allergic/immunologic and  psychiatric.   Past Medical History  Diagnosis Date  . Cerebral palsy (HCC)   . Lennox-Gastaut syndrome (HCC)   . Profound mental retardation   . Osteoporosis   . Rickets   . Hypothyroidism   . Seizures (HCC)   . Reflux    Hospitalizations: No., Head Injury: No., Nervous System Infections: No., Immunizations up to date: Yes.   Past Medical History Comments: See history  Surgical History Past Surgical History  Procedure Laterality Date  . Peg tube placement    . Gastrocutaneous fistual repair  2012    Family History family history includes Cancer in her maternal aunt; Heart disease in her maternal grandmother; Hypertension in her mother and paternal grandmother; Stroke in her maternal grandfather. There is no history of Colon cancer. Family History is otherwise negative for migraines, seizures, cognitive impairment, blindness, deafness, birth defects, chromosomal disorder, autism.  Social History Social History   Social History  . Marital Status: Single    Spouse Name: N/A  . Number of Children: 0  . Years of Education: N/A   Occupational History  . Disabled     Social History Main Topics  . Smoking status: Never Smoker   . Smokeless tobacco: Never Used  . Alcohol Use: No  . Drug Use: No  . Sexual Activity: No   Other Topics Concern  . None   Social History Narrative   0 caffeine drinks    Lives with mother who helps ADLs.   Graduated from [employer] in 2010. Now attends day program at [employer].    Enjoys playing in water, ripping paper, watching TV, music and going out.    Allergies Allergies  Allergen Reactions  . Carbamazepine Rash  Physical Exam BP 120/70 mmHg  Pulse 76  Wt 151 lb (68.493 kg)  LMP 11/08/2015 (Within Weeks) General: well developed, well nourished female, seated in a wheelchair, in no evident distress  Head: head normocephalic and atraumatic. She has a resolving stye on her left upper eyelid. Neck: supple with no carotid or  supraclavicular bruits.  Respiratory: clear to auscultation  Cardiovascular: regular rate and rhythm, no murmurs. Her hands, lower legs and feet are edematous but less so than in previous visits.  Musculoskeletal: no obvious deformities of her limbs, but it is difficult to assess due to edema and adipose tissue covering landmarks on her legs and feet.  Skin: She has a surgical scar on her abdomen. She has a scar on her forehead. She has no upper front teeth. She has no areas of pressure or broken skin. She has no rashes or lesions.   Neurologic Exam  Mental Status: Awake and fully alert. She smiles responsively and laughs spontaneously. She claps her hands repeatedly. She has significant mental retardation. She has no language but occasionally makes vocalizations. Her mother says that she can say "Michele [name]", her mother's nickname for her, but that word is not intelligible to me. She is unable to follow any commands.  Cranial Nerves: Fundoscopic exam reveals sharp disc margins. Pupils equal, briskly reactive to light. Extraocular movements appeared to be intact. Visual fields appeared full. Turns to localize sounds. Face, tongue, palate move normally and symmetrically. Neck flexion and extension normal.  Motor: She has mild quadriparesis. She has increased tone, particularly in her legs.  Sensory: Withdrawal x 4.  Coordination: She had fairly good fine motor movements with her hands. She could not follow instructions for adequate coordination testing. She did do well with hand clapping but I could not get her to do purposeful grabbing today. Gait and Station: She is wheelchair bound. I did not get her out of her chair today. Reflexes: Diminished and symmetric. It was difficult to assess reflexes with her edema. I could not get plantar reflex response  Impression 1. 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 Congenital quadriplegia 4. Significant intellectual  disability  Recommendations for plan of care The patient's previous [facility] were reviewed. Maicee has neither had nor required imaging studies since the last visit. She had lab studies which were reviewed with Mom by phone last week. 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 She is dependent upon others for all activities of daily living. Michele [name] had eye twitching last week that was thought to be seizures until a stye developed on her left upper eyelid. When the stye began resolving, the twitching stopped. Michele [name] was likely experiencing discomfort in her eye and unable to purposefully rub her eye to relieve it. I talked with Mom about this and we will make no changes in Michele [name]'s plan of care at this time. Mom will continue to report seizure activity when it occurs. I will see Michele [name] back in follow up in 6 months or sooner if needed. Mom agreed with the plans made today.  The medication list was reviewed and reconciled.  No changes were made in the prescribed medications today.  A complete medication list was provided to the patient's mother.    Medication List       This list is accurate as of: 11/25/15  8:32 AM.  Always use your most recent med list.  alendronate 70 MG tablet  Commonly known as:  FOSAMAX  TAKE 1 TABLET BY MOUTH EVERY 7 DAYS ON SATURDAY WIT FULL GLASS OF WATER ON AN EMPTY STOMACH     BANZEL 40 MG/ML Susp  Generic drug:  Rufinamide  Give Michele [name] 5ml by mouth in the morning and 5ml by mouth at night.     CALCARB 600/D 600-400 MG-UNIT tablet  Generic drug:  Calcium Carbonate-Vitamin D  Take 1 tablet by mouth 2 (two) times daily. 1 tab per tube 2 time a day     CARNITOR 1 GM/10ML solution  Generic drug:  levOCARNitine  Give 5ml by mouth 3 times per day     clotrimazole 1 % cream  Commonly known as:  LOTRIMIN  Apply 1 application  topically 2 (two) times daily.     DEPAKOTE SPRINKLES 125 MG capsule  Generic drug:  divalproex  Take 8 capsules twice per day     DIASTAT ACUDIAL 20 MG Gel  Generic drug:  diazepam  DIAL TO , GIVE RECTALLY FOR SEIZURES LASTING 2 MINUTES OR LONGER     fluticasone 50 MCG/ACT nasal spray  Commonly known as:  FLONASE  Place 2 sprays into both nostrils daily.     folic acid 1 MG tablet  Commonly known as:  FOLVITE  Take 1 mg by mouth every morning. 1 tab per tube daily     furosemide 20 MG tablet  Commonly known as:  LASIX  Take 1 tablet (20 mg total) by mouth daily as needed.     guaiFENesin 600 MG 12 hr tablet  Commonly known as:  MUCINEX  Take 1 tablet (600 mg total) by mouth 2 (two) times daily.     hydrocortisone cream 0.5 %  Apply 1 application topically 2 (two) times daily.     KEPPRA 100 MG/ML solution  Generic drug:  levETIRAcetam  TAKE BY MOUTH EVERY MORNING AND EVERY EVENING     lactulose 10 GM/15ML solution  Commonly known as:  CHRONULAC  TAKE 30 ML BY MOUTH TWICE DAILY     nystatin powder  Generic drug:  nystatin  APPLY TO AFFECTED DIAPER AREAS WITH EVERY DIAPER CHANGE UNTIL RASH CLEARS     ondansetron 4 MG/5ML solution  Commonly known as:  ZOFRAN  TAKE 5 MLS FOR PAIN EVERY 8 HOURS AS NEEDED FOR NAUSEA OR VOMITING     pantoprazole sodium 40 mg/20 mL Pack  Commonly known as:  PROTONIX  Place 10 mLs (20 mg total) into feeding tube daily.     TOPAMAX SPRINKLE 25 MG capsule  Generic drug:  topiramate  TAKE 6 CAPSULES BY MOUTH IN THE MORNING AND 6 CAPSULES IN THE EVENING WITH FOOD        Dr. Sharene Skeans was consulted regarding the patient.   Total time spent with the patient was 20 minutes, of which 50% or more was spent in counseling and coordination of care.   Elveria Rising

## 2015-11-25 NOTE — Telephone Encounter (Signed)
This is your patient, for med refill

## 2015-11-25 NOTE — Patient Instructions (Signed)
The eye twitching behavior was likely related to the stye on Michele [name]'s left upper eyelid. We will make no changes in her seizure medication at this time.   Please continue to notify me when she has seizures.   Consider signing up for MyChart - the online access to Michele [name]'s electronic medical records.   Please plan to return for follow up in 6 months or sooner if needed.

## 2015-11-29 ENCOUNTER — Other Ambulatory Visit: Payer: Self-pay | Admitting: Family Medicine

## 2015-11-29 NOTE — Telephone Encounter (Signed)
Was prescribed in 2015.  Please clarify why patient is taking medication.  She does not have a history of congestive heart failure that I know of.

## 2015-11-30 ENCOUNTER — Telehealth: Payer: Self-pay | Admitting: Family Medicine

## 2015-11-30 NOTE — Telephone Encounter (Signed)
Referral is needed for a new wheelchair.  She is seen at [facility] at [address]. Mother was told PCP needed to make the referral.  The first appt for Michele [name] was July 12. Mom wanted to know if something could be arranged for this to be done before July 12

## 2015-11-30 NOTE — Telephone Encounter (Signed)
Will forward to MD to advise. Scott Vanderveer,CMA  

## 2015-12-01 ENCOUNTER — Other Ambulatory Visit: Payer: Self-pay | Admitting: Family Medicine

## 2015-12-01 DIAGNOSIS — G809 Cerebral palsy, unspecified: Secondary | ICD-10-CM

## 2015-12-01 DIAGNOSIS — R600 Localized edema: Secondary | ICD-10-CM

## 2015-12-01 DIAGNOSIS — G808 Other cerebral palsy: Secondary | ICD-10-CM

## 2015-12-01 MED ORDER — FUROSEMIDE 20 MG PO TABS: 20.0000 mg | ORAL_TABLET | Freq: Every day | ORAL | Status: DC | PRN

## 2015-12-01 NOTE — Telephone Encounter (Signed)
Yes, I will place a referral this afternoon.

## 2015-12-01 NOTE — Telephone Encounter (Signed)
Rx sent to pharmacy.  Please let patient's mother know.

## 2015-12-01 NOTE — Telephone Encounter (Signed)
Contacted pt mother and she stated that she takes this medication PRN for swelling in her feet.  She said Dr. Paulina FusiHess started her on this and yes she still does take it when needed. Forwarding to PCP. Lamonte SakaiZimmerman Rumple, April D, [place]

## 2015-12-02 NOTE — Telephone Encounter (Signed)
LVM to call office to inform of below. Zimmerman Rumple, April D, CMA  

## 2015-12-03 NOTE — Telephone Encounter (Signed)
Mother is aware of referral and plans to call and check on how soon appt can be made. Jazmin Hartsell,CMA

## 2015-12-16 ENCOUNTER — Other Ambulatory Visit: Payer: Self-pay | Admitting: Family

## 2015-12-16 ENCOUNTER — Other Ambulatory Visit: Payer: Self-pay | Admitting: Family Medicine

## 2015-12-16 DIAGNOSIS — M810 Age-related osteoporosis without current pathological fracture: Secondary | ICD-10-CM

## 2015-12-17 NOTE — Telephone Encounter (Signed)
This is your patient for med refill 

## 2015-12-17 NOTE — Telephone Encounter (Signed)
Attempted to call Francella's mom, Ms Daphine DeutscherMartin, re: Fosamax refill.  I could not find a recent bone mineral density test (DEXA scan) in the patient's file.  We need to perform this before continuing this medication.  Please advise patient.  I will place this order in EPIC and she can have this scheduled.

## 2015-12-28 NOTE — Telephone Encounter (Signed)
LVM for [name] to call our office. Need to inform her of the information below and speak with her about when would be good to schedule DEXA scan. Sunday SpillersSharon T Saunders, CMA

## 2015-12-29 ENCOUNTER — Ambulatory Visit (INDEPENDENT_AMBULATORY_CARE_PROVIDER_SITE_OTHER): Payer: Medicaid Other | Admitting: Family Medicine

## 2015-12-29 ENCOUNTER — Encounter: Payer: Self-pay | Admitting: Family Medicine

## 2015-12-29 VITALS — BP 94/64 | HR 71 | Temp 97.6°F

## 2015-12-29 DIAGNOSIS — G808 Other cerebral palsy: Secondary | ICD-10-CM | POA: Diagnosis not present

## 2015-12-29 DIAGNOSIS — F73 Profound intellectual disabilities: Secondary | ICD-10-CM | POA: Diagnosis not present

## 2015-12-29 DIAGNOSIS — G809 Cerebral palsy, unspecified: Secondary | ICD-10-CM

## 2015-12-29 NOTE — Telephone Encounter (Signed)
Spoke with Ms. Daphine DeutscherMartin. DEXA scan scheduled for Friday 01/07/2016. Sunday SpillersSharon T Saunders, CMA

## 2015-12-29 NOTE — Progress Notes (Signed)
    Subjective: CC: wheelchair HPI: Michele [name] is a 30 y.o. female presenting to clinic today for need for wheelchair. Concerns today include:  1. Cerebral Palsy Has been using a manual wheelchair at home that is over 29 years old.  Starting to show wear.  Is able to stand with assistance but otherwise completely dependent upon wheelchair for mobility.  Attends a day program.  Otherwise, is at home with mother, whom she is completely dependent upon for ADLs.  Notes that she does try to get out of chair on her own sometimes but often will fall as a result.  Was in PT on [address] but finished over a year ago.  She works with specialists at the day program to keep active.  Perform ROM and flexibility work with patient.  Social History Reviewed: non smoker, lives with mother ApexFamHx and MedHx reviewed.  Please see EMR.  ROS: Per HPI  Objective: Office vital signs reviewed. BP 94/64 mmHg  Pulse 71  Temp(Src) 97.6 F (36.4 C) (Oral)  Wt   Physical Examination:  General: obese, No acute distress MSK: wheel chair bound.   Neuro: patellar DTRs absent, decreased LE tone, cognitive impairment so cannot follow commands  Assessment/ Plan: 30 y.o. female   1. Infantile cerebral palsy (HCC).  Completely dependent upon her mother for ADLs.  Wheelchair bound. - Continue activities to promote strength  2. Profound intellectual disabilities  3. Congenital quadriplegia (HCC) - Referral already placed for wheelchair fitting.  Mother has appt  Additionally discussed need for DEXA before continued Fosamax.  Mother voices good understanding and has appt scheduled   Michele IpAshly M Aleyssa Pike, DO PGY-3, [facility]

## 2015-12-29 NOTE — Patient Instructions (Signed)
Please call me if there are any issues with getting her a new wheelchair.

## 2016-01-07 ENCOUNTER — Ambulatory Visit
Admission: RE | Admit: 2016-01-07 | Discharge: 2016-01-07 | Disposition: A | Discharge status: Home / Self Care | Payer: Medicaid Other | Source: Ambulatory Visit | Attending: Family Medicine | Admitting: Family Medicine

## 2016-01-07 DIAGNOSIS — M810 Age-related osteoporosis without current pathological fracture: Secondary | ICD-10-CM

## 2016-01-12 ENCOUNTER — Other Ambulatory Visit: Payer: Self-pay | Admitting: Family Medicine

## 2016-01-12 ENCOUNTER — Other Ambulatory Visit: Payer: Self-pay | Admitting: Family

## 2016-01-12 DIAGNOSIS — B372 Candidiasis of skin and nail: Secondary | ICD-10-CM

## 2016-01-12 DIAGNOSIS — G40319 Generalized idiopathic epilepsy and epileptic syndromes, intractable, without status epilepticus: Secondary | ICD-10-CM

## 2016-01-12 NOTE — Telephone Encounter (Signed)
This is your patient, see med refill 

## 2016-01-18 ENCOUNTER — Ambulatory Visit: Payer: Medicaid Other | Attending: Addiction Medicine | Admitting: Physical Therapy

## 2016-01-18 DIAGNOSIS — R2689 Other abnormalities of gait and mobility: Secondary | ICD-10-CM

## 2016-01-19 NOTE — Therapy (Signed)
[facility] [address] [place], [place], [address] Phone: [phone]   Fax:  [phone]  Physical Therapy Evaluation  Patient Details  Name: Michele [name] MRN: [id number] Date of Birth: 09/07/1985 Referring Provider: Dr. Delynn Flavin  Encounter Date: 01/18/2016      PT End of Session - 01/19/16 0859    Visit Number 1   PT Start Time 1315   PT Stop Time 1424   PT Time Calculation (min) 69 min      Past Medical History:  Diagnosis Date  . Cerebral palsy (HCC)   . Hypothyroidism   . Lennox-Gastaut syndrome (HCC)   . Osteoporosis   . Profound mental retardation   . Reflux   . Rickets   . Seizures (HCC)     Past Surgical History:  Procedure Laterality Date  . Gastrocutaneous fistual repair  2012  . PEG TUBE PLACEMENT      There were no vitals filed for this visit.           [facility] PT Assessment - 01/19/16 0001      Assessment   Medical Diagnosis CP:  Spastic quadriplegia   Referring Provider Dr. Delynn Flavin   Onset Date/Surgical Date --  Congenital       Manual wheelchair eval completed with Josh Cadle, ATP from [facility] - [employer] manual wheelchair with tilt recommended- See PT wheelchair eval for details.                                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; pt presents to PT using manual wheelchair - is dependent for propulsion by caregiver; pt is nonverbal and presents with cognitive deficits; dependent for all mobility and ADL's   Rehab Potential --  wheelchair eval only   PT Frequency 1x / week   PT Duration --  eval only   PT Treatment/Interventions ADLs/Self Care Home Management;Patient/family education  wheelchair evaluation   PT Next Visit Plan N/A   PT Home Exercise Plan N/A   Consulted and Agree with Plan of Care Family member/caregiver       Patient will benefit from skilled therapeutic intervention in order to improve the following deficits and impairments:  Decreased mobility  Visit Diagnosis: Other abnormalities of gait and mobility     Problem List Patient Active Problem List   Diagnosis Date Noted  . Stye left upper eyelid 11/25/2015  . Intertriginous candidiasis 09/15/2015  . Rhinitis, non-allergic 06/23/2015  . Special screening examination for human papillomavirus (HPV) 07/20/2014  . Amenorrhea 05/19/2014  . 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 Long-term use of high-risk medication 12/24/2012  . Morbid obesity (HCC) 12/24/2012  . 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 URINARY INCONTINENCE 02/25/2010  . 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 Hypothyroidism 01/29/2009  . 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 Infantile cerebral palsy (HCC) 01/29/2009  . GASTROESOPHAGEAL REFLUX DISEASE 01/29/2009  . Osteoporosis 01/29/2009  . Seizure disorder (HCC) 01/29/2009    Kimberely Mccannon, Donavan Burnet, PT 01/19/2016, 9:10 AM  [facility] [address] [place], [place], [address] Phone: [phone]   Fax:  [phone]  Name: Michele [name] MRN: [id number] Date of Birth: January 17, 1986

## 2016-01-27 ENCOUNTER — Encounter: Payer: Self-pay | Admitting: Family Medicine

## 2016-01-27 NOTE — Progress Notes (Signed)
Form completed/ signed and placed in fax pile for incontinence supplies

## 2016-02-07 ENCOUNTER — Other Ambulatory Visit: Payer: Self-pay | Admitting: Family

## 2016-02-07 ENCOUNTER — Other Ambulatory Visit: Payer: Self-pay | Admitting: Family Medicine

## 2016-02-07 DIAGNOSIS — B372 Candidiasis of skin and nail: Secondary | ICD-10-CM

## 2016-02-07 DIAGNOSIS — G40319 Generalized idiopathic epilepsy and epileptic syndromes, intractable, without status epilepticus: Secondary | ICD-10-CM

## 2016-02-07 DIAGNOSIS — R600 Localized edema: Secondary | ICD-10-CM

## 2016-03-06 ENCOUNTER — Other Ambulatory Visit: Payer: Self-pay | Admitting: Family Medicine

## 2016-03-06 DIAGNOSIS — Z79899 Other long term (current) drug therapy: Secondary | ICD-10-CM

## 2016-03-06 DIAGNOSIS — R600 Localized edema: Secondary | ICD-10-CM

## 2016-03-06 MED ORDER — FUROSEMIDE 20 MG PO TABS: ORAL_TABLET | ORAL | 4 refills | Status: DC

## 2016-03-06 NOTE — Telephone Encounter (Signed)
Please have patient come in for electrolyte panel.  Lab has been placed.  Refill provided for Lasix.

## 2016-03-07 NOTE — Telephone Encounter (Signed)
Has appt tomorrow (9/20). Blaine Guiffre, Maryjo RochesterJessica Dawn, CMA

## 2016-03-08 ENCOUNTER — Encounter: Payer: Self-pay | Admitting: Family Medicine

## 2016-03-08 ENCOUNTER — Ambulatory Visit (INDEPENDENT_AMBULATORY_CARE_PROVIDER_SITE_OTHER): Payer: Medicaid Other | Admitting: Family Medicine

## 2016-03-08 DIAGNOSIS — G809 Cerebral palsy, unspecified: Secondary | ICD-10-CM

## 2016-03-08 DIAGNOSIS — Z23 Encounter for immunization: Secondary | ICD-10-CM | POA: Diagnosis present

## 2016-03-08 NOTE — Assessment & Plan Note (Deleted)
Patient s/p evaluation for Manual wheelchair with Josh Cadle, ATP who recommends [employer] manual wheelchair with tilt.  I agree reviewed his evaluation and agree with his assessment.  Patient requires this specific wheelchair because she has limited mobility secondary to cognitive impairment and generalized weakness.  She is unable to pressure release independently.  [facility]

## 2016-03-08 NOTE — Assessment & Plan Note (Signed)
Patient s/p evaluation for Manual wheelchair with Josh Cadle, ATP who recommends [employer] manual wheelchair with tilt.  I agree reviewed his evaluation and agree with his assessment.  Patient requires this specific wheelchair because she has limited mobility secondary to cognitive impairment and generalized weakness.  She is unable to pressure release independently.  Notes to be faxed to [facility]. 

## 2016-03-08 NOTE — Progress Notes (Signed)
    Subjective: CC: face to face for wheel chair HPI: Michele [name] is a 30 y.o. female presenting to clinic today for office visit. Concerns today include:  1. Cerebral palsy Patient is dependent upon wheel chair for mobility.  She is unable to use a cane/ walker for mobility.  She is completely dependent upon her mother for ADLs.  She has seen PT for wheelchair evaluation and manual wheelchair is recommended.  Patient unable to perform transfers independently.  No falls.  Was receiving PT on [address] but finished over a year ago.  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  Social History Reviewed: non smoker. FamHx and MedHx reviewed.  Please see EMR. Health Maintenance: flu shot  ROS: Per HPI  Objective: Office vital signs reviewed. BP 120/64   Pulse 80   Temp 97.3 F (36.3 C) (Axillary)   Wt 157 lb (71.2 kg) Comment: per mom  BMI 31.71 kg/m   Physical Examination:  General: Awake, alert, well nourished, wheelchair bound, No acute distress Cardio: regular rate  Pulm: normal WOB on room air MSK: wheelchair bound, wheelchair appears to be ill fitting  Assessment/ Plan: 30 y.o. female   Infantile cerebral palsy Patient s/p evaluation for Manual wheelchair with Josh Cadle, ATP who recommends [employer] manual wheelchair with tilt.  I agree reviewed his evaluation and agree with his assessment.  Patient requires this specific wheelchair because she has limited mobility secondary to cognitive impairment and generalized weakness.  She is unable to pressure release independently.  [facility].  Additionally, BMP ordered in the setting of Lasix use prn  Michele IpAshly M Gottschalk, DO PGY-3, [facility]

## 2016-04-03 ENCOUNTER — Ambulatory Visit (HOSPITAL_COMMUNITY)
Admission: RE | Admit: 2016-04-03 | Discharge: 2016-04-03 | Disposition: A | Discharge status: Home / Self Care | Payer: Medicaid Other | Source: Ambulatory Visit | Attending: Family Medicine | Admitting: Family Medicine

## 2016-04-03 ENCOUNTER — Ambulatory Visit (INDEPENDENT_AMBULATORY_CARE_PROVIDER_SITE_OTHER): Payer: Medicaid Other | Admitting: Internal Medicine

## 2016-04-03 ENCOUNTER — Encounter: Payer: Self-pay | Admitting: Internal Medicine

## 2016-04-03 VITALS — BP 99/72 | HR 89 | Temp 97.8°F

## 2016-04-03 DIAGNOSIS — R058 Other specified cough: Secondary | ICD-10-CM

## 2016-04-03 DIAGNOSIS — R05 Cough: Secondary | ICD-10-CM

## 2016-04-03 DIAGNOSIS — R059 Cough, unspecified: Secondary | ICD-10-CM | POA: Insufficient documentation

## 2016-04-03 DIAGNOSIS — R051 Acute cough: Secondary | ICD-10-CM | POA: Insufficient documentation

## 2016-04-03 NOTE — Assessment & Plan Note (Addendum)
She has had congestion and cough productive of clear sputum for three days. Also some associated decreased appetite. No fevers or increased work of breathing. O2 91% on room air in clinic (unclear what her baseline is). Unable to get a good lung exam because Pt will not take a deep breath, but lungs are seemingly clear.  - Given her productive cough and decreased appetite, will get a CXR to rule out pneumonia, although I think this is more likely a viral URI. - Will call Mom with results of CXR - Advised Mom to give her Tylenol every 6 hours for the next two days, then prn. - Mom should offer her Pedialyte and water to keep her hydrated - Return precautions discussed in detail - Follow-up if not improving or if showing signs of worsening.

## 2016-04-03 NOTE — Progress Notes (Signed)
   [facility] Phone: [phone]  Subjective:  Michele [name] is a 30 year old female with a history of infantile cerebral palsy who was brought to clinic by her mom with concerns that she is not eating well and has a cough for the last 3 days. The cough is productive of clear sputum. She has also had associated congestion with clear rhinorrhea. Mom has been giving her Wal-tussin-DM ([employer] brand robitussin), which hasn't been helping. She has not been eating as well over the last 3 days. Mom has been giving her Boost and liquids, but she has not been taking solid foods. Mom has also noticed that she has been sleeping more than usual. She has not had any fevers, vomiting, diarrhea, shortness of breath, or rash. She has not had any change in the color or odor of her urine. She has a history of seizures, but has not had any seizure activity recently. No sick contacts.  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 History is provided by mother.  ROS: See HPI for pertinent positives and negatives  Past Medical History- infantile cerebral palsy, seizure disorder, GERD, osteoporosis, urinary/bowel incontinence, morbid obesity  Family history reviewed for today's visit. No changes.  Social history- patient is a never smoker. 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  Objective: BP 99/72   Pulse 89   Temp 97.8 F (36.6 C) (Axillary)  Gen: NAD, alert, active, coughing intermittently throughout exam. HEENT: NCAT, EOMI, MMM, TMs clear, unable to examine oropharynx Neck: FROM, supple, no cervical lymphadenopathy CV: RRR, no murmur Resp: Normal work of breathing, normal respiratory rate, lungs are seemingly clear but cannot get a good exam as Pt will not take a deep breath GI: SNTND, BS present, no guarding or organomegaly, no masses Msk: Mild symmetric pretibial edema bilaterally, warm, normal tone, moves UE/LE spontaneously Neuro: Alert and oriented,  does not speak, moves all extremities spontaneously Skin: No rashes or lesions on exposed skin.  Assessment/Plan: Productive cough: She has had congestion and cough productive of clear sputum for three days. Also some associated decreased appetite. No fevers or increased work of breathing. O2 91% on room air in clinic (unclear what her baseline is). Unable to get a good lung exam because Pt will not take a deep breath, but lungs are seemingly clear.  - Given her productive cough and decreased appetite, will get a CXR to rule out pneumonia, although I think this is more likely a viral URI. - Will call Mom with results of CXR - Advised Mom to give her Tylenol every 6 hours for the next two days, then prn. - Mom should offer her Pedialyte and water to keep her hydrated - Return precautions discussed in detail - Follow-up if not improving or if showing signs of worsening.  Willadean CarolKaty Mayo, MD PGY-2

## 2016-04-03 NOTE — Patient Instructions (Signed)
It was so nice to meet you guys!  I have ordered a chest x-ray to rule out pneumonia. You should go over to the hospital to have this done. I will call you with the results.  I would also recommend giving her Tylenol every 6 hours over the next two days to help her feel better.  If she starts becoming more drowsy, if she starts vomiting or having diarrhea, if you notice a new odor or color of her urine, if she starts becoming short of breath, or if she starts having fevers greater than 100.4, please come back to see us.  -Dr. Nancy MarusMayo

## 2016-04-04 ENCOUNTER — Ambulatory Visit: Payer: Medicaid Other | Admitting: Student

## 2016-04-04 ENCOUNTER — Inpatient Hospital Stay (HOSPITAL_COMMUNITY)
Admission: EM | Admit: 2016-04-04 | Discharge: 2016-04-11 | DRG: 871 | Disposition: A | Discharge status: Home Health | Payer: Medicaid Other | Attending: Family Medicine | Admitting: Family Medicine

## 2016-04-04 ENCOUNTER — Encounter (HOSPITAL_COMMUNITY): Payer: Self-pay

## 2016-04-04 ENCOUNTER — Telehealth: Payer: Self-pay | Admitting: Family Medicine

## 2016-04-04 DIAGNOSIS — Z7951 Long term (current) use of inhaled steroids: Secondary | ICD-10-CM

## 2016-04-04 DIAGNOSIS — J181 Lobar pneumonia, unspecified organism: Secondary | ICD-10-CM

## 2016-04-04 DIAGNOSIS — R0902 Hypoxemia: Secondary | ICD-10-CM | POA: Diagnosis present

## 2016-04-04 DIAGNOSIS — M810 Age-related osteoporosis without current pathological fracture: Secondary | ICD-10-CM | POA: Diagnosis present

## 2016-04-04 DIAGNOSIS — E876 Hypokalemia: Secondary | ICD-10-CM | POA: Diagnosis not present

## 2016-04-04 DIAGNOSIS — J189 Pneumonia, unspecified organism: Secondary | ICD-10-CM

## 2016-04-04 DIAGNOSIS — A419 Sepsis, unspecified organism: Principal | ICD-10-CM | POA: Diagnosis present

## 2016-04-04 DIAGNOSIS — Z888 Allergy status to other drugs, medicaments and biological substances status: Secondary | ICD-10-CM

## 2016-04-04 DIAGNOSIS — K219 Gastro-esophageal reflux disease without esophagitis: Secondary | ICD-10-CM | POA: Diagnosis present

## 2016-04-04 DIAGNOSIS — G40909 Epilepsy, unspecified, not intractable, without status epilepticus: Secondary | ICD-10-CM

## 2016-04-04 DIAGNOSIS — E870 Hyperosmolality and hypernatremia: Secondary | ICD-10-CM | POA: Diagnosis not present

## 2016-04-04 DIAGNOSIS — G40812 Lennox-Gastaut syndrome, not intractable, without status epilepticus: Secondary | ICD-10-CM | POA: Diagnosis present

## 2016-04-04 DIAGNOSIS — J690 Pneumonitis due to inhalation of food and vomit: Secondary | ICD-10-CM | POA: Diagnosis present

## 2016-04-04 DIAGNOSIS — G808 Other cerebral palsy: Secondary | ICD-10-CM | POA: Diagnosis present

## 2016-04-04 DIAGNOSIS — Z6839 Body mass index (BMI) 39.0-39.9, adult: Secondary | ICD-10-CM

## 2016-04-04 DIAGNOSIS — E039 Hypothyroidism, unspecified: Secondary | ICD-10-CM | POA: Diagnosis present

## 2016-04-04 DIAGNOSIS — Z7983 Long term (current) use of bisphosphonates: Secondary | ICD-10-CM

## 2016-04-04 DIAGNOSIS — F73 Profound intellectual disabilities: Secondary | ICD-10-CM | POA: Diagnosis present

## 2016-04-04 DIAGNOSIS — R2681 Unsteadiness on feet: Secondary | ICD-10-CM

## 2016-04-04 DIAGNOSIS — Z79899 Other long term (current) drug therapy: Secondary | ICD-10-CM

## 2016-04-04 LAB — CBG MONITORING, ED: GLUCOSE-CAPILLARY: 93 mg/dL (ref 65–99)

## 2016-04-04 MED ORDER — SODIUM CHLORIDE 0.9 % IV BOLUS (SEPSIS)
1000.0000 mL | Freq: Once | INTRAVENOUS | Status: AC
  Administered 2016-04-05: 1000 mL via INTRAVENOUS

## 2016-04-04 NOTE — Telephone Encounter (Signed)
Mother is calling and would like the x-ray results. jw

## 2016-04-04 NOTE — Telephone Encounter (Signed)
Can you follow up with Kealani's mom?

## 2016-04-04 NOTE — Telephone Encounter (Signed)
I have already called Pt's Mom and discussed the x-ray results with her. Thanks!

## 2016-04-04 NOTE — ED Notes (Signed)
Iv team in room 

## 2016-04-04 NOTE — ED Triage Notes (Signed)
Patient seen on 10/16 had chest xray to rule pnuemonia.  Caregiver states that patient is non-verbal with hx of CP but, she has become "different today".  Patient is incontinent and has not urinated since 0800 this morning.  Patient moans as if she is in pain, patient has not drank and has had a loss of appetite.

## 2016-04-04 NOTE — ED Provider Notes (Signed)
[facility]-EMERGENCY DEPT Provider Note   CSN: [id number] Arrival date & time: 04/04/16  2132  By signing my name below, I, [name], attest that this documentation has been prepared under the direction and in the presence of [name], PA-C Electronically Signed: [name], ED Scribe. 04/04/16. Marland Kitchen   History   Chief Complaint Chief Complaint  Patient presents with  . Altered Mental Status    LEVEL 5 CAVEAT: PATIENT NONVERBAL  HPI Michele [name] is a 30 y.o. female with a PMHx of cerebral palsy, lennox-gastaut syndrome, profound mental retardation, who presents to the Emergency Department complaining of AMS onset today. Pt mother reports that the pt was seen at [facility] yesterday for a productive cough and had a CXR completed to rule out pneumonia. Mother states that the pt slid out of her wheelchair today prior to the onset of her symptoms causing concern that she is weaker than her usual. No injury from the fall. Mother notes that the pt is able to ambulate some but not without falling and she has a wheelchair that she uses. Mother reports that the pt appears to be in pain due to the patient moaning excessively which is new today. Mother states that the pt is having associated symptoms of appetite change and cough. Mother notes that the pt has not been given any medications for the relief of her symptoms. Mother denies the pt having associated symptoms of fever, vomiting, and any other symptoms. Mother denies the pt having a hx of asthma or inhaler use.   The history is provided by a parent. No language interpreter was used.    Past Medical History:  Diagnosis Date  . Cerebral palsy (HCC)   . Hypothyroidism   . Lennox-Gastaut syndrome (HCC)   . Osteoporosis   . Profound mental retardation   . Reflux   . Rickets   . Seizures [facility])     Patient Active Problem List   Diagnosis Date Noted  . Productive cough 04/03/2016  . Stye left upper eyelid 11/25/2015  .  Intertriginous candidiasis 09/15/2015  . Rhinitis, non-allergic 06/23/2015  . Special screening examination for human papillomavirus (HPV) 07/20/2014  . Amenorrhea 05/19/2014  . 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 Long-term use of high-risk medication 12/24/2012  . Morbid obesity (HCC) 12/24/2012  . 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 URINARY INCONTINENCE 02/25/2010  . 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 Hypothyroidism 01/29/2009  . 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 Infantile cerebral palsy (HCC) 01/29/2009  . GASTROESOPHAGEAL REFLUX DISEASE 01/29/2009  . Osteoporosis 01/29/2009  . Seizure disorder (HCC) 01/29/2009    Past Surgical History:  Procedure Laterality Date  . Gastrocutaneous fistual repair  2012  . PEG TUBE PLACEMENT      OB History    No data available       Home Medications    Prior to Admission medications   Medication Sig Start Date End Date Taking? Authorizing Provider  alendronate (FOSAMAX) 70 MG tablet TAKE 1 TABLET BY MOUTH EVERY 7 DAYS ON SATURDAY WITH A FULL GLASS OF WATER ON AN EMPTY STOMACH 01/12/16   Ashly M Gottschalk, DO  BANZEL 40 MG/ML SUSP TAKE 5 MLS BY MOUTH IN THE MORNING AND 5 MLS BY MOUTH AT NIGHT 01/12/16   Elveria Rising, NP  Calcium Carbonate-Vitamin D (CALCARB 600/D) 600-400 MG-UNIT per tablet Take 1 tablet by mouth 2 (two) times daily. 1 tab  per tube 2 time a day    Historical Provider, MD  CARNITOR 1 GM/10ML solution TAKE 5 MLS BY MOUTH THREE TIMES DAILY 01/12/16   Elveria Rising, NP  clotrimazole (LOTRIMIN) 1 % cream APPLY EXTERNALLY TO THE AFFECTED AREA TWICE DAILY 01/12/16   Ashly M Gottschalk, DO  DEPAKOTE SPRINKLES 125 MG capsule TAKE 8 CAPSULES BY MOUTH TWICE DAILY 02/07/16   Elveria Rising, NP  DIASTAT ACUDIAL 20 MG GEL DIAL TO 15MG , GIVE RECTALLY FOR SEIZURES LASTING 2 MINUTES OR LONGER 01/12/16   Elveria Rising, NP  fluticasone (FLONASE) 50 MCG/ACT nasal spray SHAKE LIQUID AND USE 2 SPRAYS IN [facility] NOSTRIL DAILY 01/12/16   Ashly Hulen Skains, DO  folic acid (FOLVITE) 1 MG tablet Take 1 mg by mouth every morning. 1 tab per tube daily    Historical Provider, MD  furosemide (LASIX) 20 MG tablet TAKE 1 TABLET(20 MG) BY MOUTH DAILY AS NEEDED 03/06/16   Ashly Hulen Skains, DO  guaiFENesin (MUCINEX) 600 MG 12 hr tablet Take 1 tablet (600 mg total) by mouth 2 (two) times daily. 06/23/15   Joanna Puff, MD  hydrocortisone cream 0.5 % Apply 1 application topically 2 (two) times daily. 10/06/15   Ashly M Gottschalk, DO  KEPPRA 100 MG/ML solution TAKE 12 MLS BY MOUTH EVERY MORNING AND 12 MLS EVERY EVENING 01/12/16   Elveria Rising, NP  lactulose (CHRONULAC) 10 GM/15ML solution TAKE 30 ML BY MOUTH TWICE DAILY 05/17/15   Ashly Hulen Skains, DO  NYSTATIN powder APPLY TO AFFECTED DIAPER AREAS WITH EVERY DIAPER CHANGE UNTIL RASH CLEARS 01/12/16   Ashly M Gottschalk, DO  ondansetron (ZOFRAN) 4 MG/5ML solution TAKE 5 MLS FOR PAIN EVERY 8 HOURS AS NEEDED FOR NAUSEA OR VOMITING 06/29/14   Briscoe Deutscher, DO  pantoprazole sodium (PROTONIX) 40 mg/20 mL PACK Place 10 mLs (20 mg total) into feeding tube daily. 06/18/15   Ashly M Gottschalk, DO  TOPAMAX SPRINKLE 25 MG capsule TAKE 6 CAPSULES BY MOUTH IN THE MORNING AND 6 CAPSULES BY MOUTH IN THE EVENING WITH FOOD 02/07/16   Elveria Rising, NP    Family History Family History  Problem Relation Age of Onset  . Hypertension Mother   . Heart disease Maternal Grandmother     Died at 29  . Stroke Maternal Grandfather     died at 61  . Hypertension Paternal Grandmother   . Diabetes      MGGM  . Heart disease      MGGF  . Kidney disease      Maternal Great Aunt   . Cancer Maternal Aunt     lung  . Colon cancer Neg Hx     Social History Social History  Substance Use Topics  . Smoking status: Never Smoker  . Smokeless tobacco: Never Used  . Alcohol use No      Allergies   Carbamazepine   Review of Systems Review of Systems  Unable to perform ROS: Patient nonverbal     Physical Exam Updated Vital Signs BP 105/65 (BP Location: Left Arm)   Pulse 84   Temp 97.5 F (36.4 C)   Resp 18   SpO2 94%   Physical Exam  Constitutional: She appears well-developed and well-nourished.  HENT:  Head: Atraumatic.  Right Ear: External ear normal.  Left Ear: External ear normal.  Mouth/Throat: Oropharynx is clear and moist.  Eyes: EOM are normal.  Neck: Neck supple.  Cardiovascular: Normal rate, regular rhythm and normal heart sounds.  Exam reveals no gallop and no friction rub.   No murmur heard. Pulmonary/Chest: Effort normal. No respiratory distress. She has no wheezes. She has rales.  Left lower rales noted  Abdominal: Soft. She exhibits no distension. There is no guarding.  Musculoskeletal: Normal range of motion.  Skin: Skin is warm and dry. No rash noted.  Nursing note and vitals reviewed.    ED Treatments / Results  DIAGNOSTIC STUDIES: Oxygen Saturation is 94% on RA, adequate by my interpretation.    COORDINATION OF CARE: 11:44 PM Discussed treatment plan with pt family at bedside which includes labs, UA, and pt family agreed to plan.   Labs (all labs ordered are listed, but only abnormal results are displayed) Labs Reviewed  COMPREHENSIVE METABOLIC PANEL  CBC  CBG MONITORING, ED    EKG  EKG Interpretation None       Radiology Dg Chest 2 View  Result Date: 04/04/2016 CLINICAL DATA:  Productive cough, congestion EXAM: CHEST  2 VIEW COMPARISON:  Portable chest x-ray of 04/03/2014 and chest x-ray of 04/02/2014 FINDINGS: The lungs again are not well aerated with mild basilar volume loss present. No definite pneumonia or effusion is seen. Deployed inspiration results in some crowding of vessels of the lung bases. Heart size is stable. No bony abnormality is seen. IMPRESSION: Poor inspiration with basilar volume loss.  No definite active process. Electronically Signed   By: Dwyane DeePaul  Barry M.D.   On: 04/04/2016 08:51    Procedures Procedures (including critical care time)  Medications Ordered in ED Medications - No data to display   Initial Impression / Assessment and Plan / ED Course  I have reviewed the triage vital signs and the nursing notes.  Pertinent labs & imaging results that were available during my care of the patient were reviewed by me and considered in my medical decision making (see chart for details).  Clinical Course    The patient is brought in by mom with concern for worsening illness with symptoms of weakness, cough, decreased interest in PO. She is hypotensive and hypothermic on arrival. She is awake and responsive but, per mom, altered from baseline.   IVF's provided. She is found to be hypoxic to the low 80's, improved on 3L via [place]. Repeat CXR performed with evidence PNA (CAP) in [facility]. Admission initiated. D[facility].  Final Clinical Impressions(s) / ED Diagnoses   Final diagnoses:  None   1. CAP  New Prescriptions New Prescriptions   No medications on file   I personally performed the services described in this documentation, which was scribed in my presence. The recorded information has been reviewed and is accurate.      Elpidio AnisShari Londin Antone, PA-C 04/05/16 2254    Charlynne Panderavid Hsienta Yao, MD 04/06/16 1150

## 2016-04-04 NOTE — Telephone Encounter (Signed)
It appears that Dr Nancy MarusMayo saw this patient.  CXR read is unremarkable for acute processes.  I will forward to her to contact Michele [name]'s mom.

## 2016-04-04 NOTE — ED Notes (Signed)
Lew DawesRitter stuck Pt twice for labs and was unsuccessful.

## 2016-04-05 ENCOUNTER — Emergency Department (HOSPITAL_COMMUNITY): Payer: Medicaid Other

## 2016-04-05 ENCOUNTER — Encounter (HOSPITAL_COMMUNITY): Payer: Self-pay | Admitting: *Deleted

## 2016-04-05 ENCOUNTER — Observation Stay (HOSPITAL_BASED_OUTPATIENT_CLINIC_OR_DEPARTMENT_OTHER): Payer: Medicaid Other

## 2016-04-05 ENCOUNTER — Observation Stay (HOSPITAL_COMMUNITY): Payer: Medicaid Other

## 2016-04-05 DIAGNOSIS — M810 Age-related osteoporosis without current pathological fracture: Secondary | ICD-10-CM | POA: Diagnosis present

## 2016-04-05 DIAGNOSIS — K219 Gastro-esophageal reflux disease without esophagitis: Secondary | ICD-10-CM | POA: Diagnosis present

## 2016-04-05 DIAGNOSIS — R0902 Hypoxemia: Secondary | ICD-10-CM | POA: Diagnosis present

## 2016-04-05 DIAGNOSIS — Z888 Allergy status to other drugs, medicaments and biological substances status: Secondary | ICD-10-CM | POA: Diagnosis not present

## 2016-04-05 DIAGNOSIS — Z7983 Long term (current) use of bisphosphonates: Secondary | ICD-10-CM | POA: Diagnosis not present

## 2016-04-05 DIAGNOSIS — G40812 Lennox-Gastaut syndrome, not intractable, without status epilepticus: Secondary | ICD-10-CM | POA: Diagnosis present

## 2016-04-05 DIAGNOSIS — Z7951 Long term (current) use of inhaled steroids: Secondary | ICD-10-CM | POA: Diagnosis not present

## 2016-04-05 DIAGNOSIS — J189 Pneumonia, unspecified organism: Secondary | ICD-10-CM

## 2016-04-05 DIAGNOSIS — Z6839 Body mass index (BMI) 39.0-39.9, adult: Secondary | ICD-10-CM | POA: Diagnosis not present

## 2016-04-05 DIAGNOSIS — G808 Other cerebral palsy: Secondary | ICD-10-CM

## 2016-04-05 DIAGNOSIS — E038 Other specified hypothyroidism: Secondary | ICD-10-CM

## 2016-04-05 DIAGNOSIS — E876 Hypokalemia: Secondary | ICD-10-CM | POA: Diagnosis not present

## 2016-04-05 DIAGNOSIS — F73 Profound intellectual disabilities: Secondary | ICD-10-CM | POA: Diagnosis present

## 2016-04-05 DIAGNOSIS — A419 Sepsis, unspecified organism: Secondary | ICD-10-CM | POA: Diagnosis present

## 2016-04-05 DIAGNOSIS — Z79899 Other long term (current) drug therapy: Secondary | ICD-10-CM | POA: Diagnosis not present

## 2016-04-05 DIAGNOSIS — R0602 Shortness of breath: Secondary | ICD-10-CM | POA: Diagnosis present

## 2016-04-05 DIAGNOSIS — J181 Lobar pneumonia, unspecified organism: Secondary | ICD-10-CM | POA: Diagnosis not present

## 2016-04-05 DIAGNOSIS — E039 Hypothyroidism, unspecified: Secondary | ICD-10-CM | POA: Diagnosis present

## 2016-04-05 DIAGNOSIS — E870 Hyperosmolality and hypernatremia: Secondary | ICD-10-CM | POA: Diagnosis not present

## 2016-04-05 DIAGNOSIS — G40909 Epilepsy, unspecified, not intractable, without status epilepticus: Secondary | ICD-10-CM

## 2016-04-05 DIAGNOSIS — J690 Pneumonitis due to inhalation of food and vomit: Secondary | ICD-10-CM | POA: Diagnosis present

## 2016-04-05 LAB — CBC
HCT: 38.9 % (ref 36.0–46.0)
HCT: 40.8 % (ref 36.0–46.0)
HEMOGLOBIN: 12.6 g/dL (ref 12.0–15.0)
Hemoglobin: 12.5 g/dL (ref 12.0–15.0)
MCH: 34.7 pg — AB (ref 26.0–34.0)
MCH: 35.9 pg — ABNORMAL HIGH (ref 26.0–34.0)
MCHC: 30.9 g/dL (ref 30.0–36.0)
MCHC: 32.1 g/dL (ref 30.0–36.0)
MCV: 111.8 fL — ABNORMAL HIGH (ref 78.0–100.0)
MCV: 112.4 fL — AB (ref 78.0–100.0)
PLATELETS: 170 10*3/uL (ref 150–400)
Platelets: 125 10*3/uL — ABNORMAL LOW (ref 150–400)
RBC: 3.63 MIL/uL — ABNORMAL LOW (ref 3.87–5.11)
RBC: 3.48 MIL/uL — AB (ref 3.87–5.11)
RDW: 17.7 % — ABNORMAL HIGH (ref 11.5–15.5)
RDW: 18.3 % — ABNORMAL HIGH (ref 11.5–15.5)
WBC: 5.3 10*3/uL (ref 4.0–10.5)
WBC: 9.9 10*3/uL (ref 4.0–10.5)

## 2016-04-05 LAB — TSH: TSH: 1.902 u[IU]/mL (ref 0.350–4.500)

## 2016-04-05 LAB — COMPREHENSIVE METABOLIC PANEL
ALBUMIN: 2.6 g/dL — AB (ref 3.5–5.0)
ALBUMIN: 3.1 g/dL — AB (ref 3.5–5.0)
ALK PHOS: 65 U/L (ref 38–126)
ALT: 19 U/L (ref 14–54)
ALT: 17 U/L (ref 14–54)
ANION GAP: 5 (ref 5–15)
ANION GAP: 6 (ref 5–15)
AST: 41 U/L (ref 15–41)
AST: 27 U/L (ref 15–41)
Alkaline Phosphatase: 75 U/L (ref 38–126)
BILIRUBIN TOTAL: 1 mg/dL (ref 0.3–1.2)
BUN: 21 mg/dL — ABNORMAL HIGH (ref 6–20)
BUN: 15 mg/dL (ref 6–20)
CALCIUM: 8.3 mg/dL — AB (ref 8.9–10.3)
CO2: 27 mmol/L (ref 22–32)
CO2: 25 mmol/L (ref 22–32)
Calcium: 8.8 mg/dL — ABNORMAL LOW (ref 8.9–10.3)
Chloride: 111 mmol/L (ref 101–111)
Chloride: 111 mmol/L (ref 101–111)
Creatinine, Ser: 0.67 mg/dL (ref 0.44–1.00)
Creatinine, Ser: 0.72 mg/dL (ref 0.44–1.00)
GFR calc Af Amer: >60 mL/min (ref >60)
GFR calc non Af Amer: >60 mL/min (ref >60)
GFR calc non Af Amer: >60 mL/min (ref >60)
GLUCOSE: 95 mg/dL (ref 65–99)
GLUCOSE: 98 mg/dL (ref 65–99)
POTASSIUM: 4.6 mmol/L (ref 3.5–5.1)
Potassium: 3.7 mmol/L (ref 3.5–5.1)
SODIUM: 142 mmol/L (ref 135–145)
SODIUM: 143 mmol/L (ref 135–145)
TOTAL PROTEIN: 7 g/dL (ref 6.5–8.1)
Total Bilirubin: 0.3 mg/dL (ref 0.3–1.2)
Total Protein: 5.9 g/dL — ABNORMAL LOW (ref 6.5–8.1)

## 2016-04-05 LAB — URINALYSIS, ROUTINE W REFLEX MICROSCOPIC
Bilirubin Urine: NEGATIVE
Glucose, UA: NEGATIVE mg/dL
HGB URINE DIPSTICK: NEGATIVE
KETONES UR: NEGATIVE mg/dL
LEUKOCYTES UA: NEGATIVE
Nitrite: NEGATIVE
PROTEIN: NEGATIVE mg/dL
Specific Gravity, Urine: 1.024 (ref 1.005–1.030)
pH: 6.5 (ref 5.0–8.0)

## 2016-04-05 LAB — HIV ANTIBODY (ROUTINE TESTING W REFLEX): HIV Screen 4th Generation wRfx: NONREACTIVE

## 2016-04-05 LAB — LACTIC ACID, PLASMA
LACTIC ACID, VENOUS: 1 mmol/L (ref 0.5–1.9)
Lactic Acid, Venous: 0.6 mmol/L (ref 0.5–1.9)

## 2016-04-05 MED ORDER — DEXTROSE 5 % IV SOLN
250.0000 mg | INTRAVENOUS | Status: DC
  Administered 2016-04-06: 250 mg via INTRAVENOUS
  Filled 2016-04-05: qty 250

## 2016-04-05 MED ORDER — TOPIRAMATE 100 MG PO TABS
150.0000 mg | ORAL_TABLET | Freq: Two times a day (BID) | ORAL | Status: DC
  Administered 2016-04-05: 150 mg via ORAL
  Filled 2016-04-05: qty 2

## 2016-04-05 MED ORDER — RUFINAMIDE 40 MG/ML PO SUSP
200.0000 mg | Freq: Two times a day (BID) | ORAL | Status: DC
  Filled 2016-04-05: qty 460

## 2016-04-05 MED ORDER — IPRATROPIUM-ALBUTEROL 0.5-2.5 (3) MG/3ML IN SOLN
3.0000 mL | Freq: Four times a day (QID) | RESPIRATORY_TRACT | Status: DC
  Administered 2016-04-05 (×3): 3 mL via RESPIRATORY_TRACT
  Filled 2016-04-05 (×3): qty 3

## 2016-04-05 MED ORDER — PANTOPRAZOLE SODIUM 40 MG PO PACK
20.0000 mg | PACK | Freq: Every day | ORAL | Status: DC
  Administered 2016-04-05 – 2016-04-11 (×7): 20 mg via ORAL
  Filled 2016-04-05 (×7): qty 20

## 2016-04-05 MED ORDER — RUFINAMIDE 40 MG/ML PO SUSP
200.0000 mg | Freq: Two times a day (BID) | ORAL | Status: DC
  Filled 2016-04-05: qty 5

## 2016-04-05 MED ORDER — SODIUM CHLORIDE 0.9 % IV BOLUS (SEPSIS)
1000.0000 mL | Freq: Once | INTRAVENOUS | Status: AC
  Administered 2016-04-05: 1000 mL via INTRAVENOUS

## 2016-04-05 MED ORDER — TOPIRAMATE 25 MG PO CPSP
150.0000 mg | ORAL_CAPSULE | Freq: Two times a day (BID) | ORAL | Status: DC
  Administered 2016-04-05 – 2016-04-11 (×12): 150 mg via ORAL
  Filled 2016-04-05 (×13): qty 6

## 2016-04-05 MED ORDER — DEXTROSE 5 % IV SOLN
1.0000 g | INTRAVENOUS | Status: DC
  Administered 2016-04-06: 1 g via INTRAVENOUS
  Filled 2016-04-05: qty 10

## 2016-04-05 MED ORDER — SODIUM CHLORIDE 0.9 % IV SOLN
INTRAVENOUS | Status: DC
Start: 2016-04-05 — End: 2016-04-06
  Administered 2016-04-05: 125 mL/h via INTRAVENOUS

## 2016-04-05 MED ORDER — DIVALPROEX SODIUM 125 MG PO CSDR
1000.0000 mg | DELAYED_RELEASE_CAPSULE | Freq: Two times a day (BID) | ORAL | Status: DC
Start: 2016-04-05 — End: 2016-04-11
  Administered 2016-04-05 – 2016-04-11 (×13): 1000 mg via ORAL
  Filled 2016-04-05 (×14): qty 8

## 2016-04-05 MED ORDER — LEVETIRACETAM 100 MG/ML PO SOLN
1200.0000 mg | Freq: Two times a day (BID) | ORAL | Status: DC
  Administered 2016-04-05: 1200 mg via ORAL
  Filled 2016-04-05: qty 15

## 2016-04-05 MED ORDER — ACETAMINOPHEN 325 MG PO TABS: 650.0000 mg | ORAL_TABLET | Freq: Four times a day (QID) | ORAL | Status: DC | PRN

## 2016-04-05 MED ORDER — IOPAMIDOL (ISOVUE-370) INJECTION 76%
INTRAVENOUS | Status: AC
  Administered 2016-04-05: 100 mL
  Filled 2016-04-05: qty 100

## 2016-04-05 MED ORDER — RUFINAMIDE 40 MG/ML PO SUSP
200.0000 mg | Freq: Two times a day (BID) | ORAL | Status: DC
  Administered 2016-04-05 – 2016-04-11 (×12): 200 mg via ORAL
  Filled 2016-04-05: qty 5
  Filled 2016-04-05 (×13): qty 460

## 2016-04-05 MED ORDER — LEVETIRACETAM 100 MG/ML PO SOLN
1200.0000 mg | Freq: Two times a day (BID) | ORAL | Status: DC
  Administered 2016-04-05 – 2016-04-11 (×12): 1200 mg via ORAL
  Filled 2016-04-05 (×12): qty 12

## 2016-04-05 MED ORDER — ACETAMINOPHEN 650 MG RE SUPP
650.0000 mg | Freq: Four times a day (QID) | RECTAL | Status: DC | PRN
Start: 2016-04-05 — End: 2016-04-11
  Administered 2016-04-06: 650 mg via RECTAL
  Filled 2016-04-05 (×2): qty 1

## 2016-04-05 MED ORDER — ONDANSETRON HCL 4 MG/2ML IJ SOLN: 4.0000 mg | Freq: Four times a day (QID) | INTRAMUSCULAR | Status: DC | PRN

## 2016-04-05 MED ORDER — IPRATROPIUM-ALBUTEROL 0.5-2.5 (3) MG/3ML IN SOLN
3.0000 mL | Freq: Three times a day (TID) | RESPIRATORY_TRACT | Status: DC
  Administered 2016-04-06 – 2016-04-08 (×6): 3 mL via RESPIRATORY_TRACT
  Filled 2016-04-05 (×6): qty 3

## 2016-04-05 MED ORDER — LEVOCARNITINE 1 GM/10ML PO SOLN
500.0000 mg | Freq: Three times a day (TID) | ORAL | Status: DC
  Administered 2016-04-05 – 2016-04-11 (×20): 500 mg via ORAL
  Filled 2016-04-05 (×21): qty 5

## 2016-04-05 MED ORDER — SODIUM CHLORIDE 0.9 % IV BOLUS (SEPSIS)
1000.0000 mL | Freq: Once | INTRAVENOUS | Status: DC
Start: 2016-04-05 — End: 2016-04-05

## 2016-04-05 MED ORDER — DEXTROSE 5 % IV SOLN
500.0000 mg | Freq: Once | INTRAVENOUS | Status: AC
  Administered 2016-04-05: 500 mg via INTRAVENOUS
  Filled 2016-04-05: qty 500

## 2016-04-05 MED ORDER — LACTULOSE 10 GM/15ML PO SOLN
20.0000 g | Freq: Two times a day (BID) | ORAL | Status: DC
  Administered 2016-04-05 – 2016-04-11 (×13): 20 g via ORAL
  Filled 2016-04-05 (×13): qty 30

## 2016-04-05 MED ORDER — ONDANSETRON HCL 4 MG PO TABS: 4.0000 mg | ORAL_TABLET | Freq: Four times a day (QID) | ORAL | Status: DC | PRN

## 2016-04-05 MED ORDER — ORAL CARE MOUTH RINSE
15.0000 mL | Freq: Two times a day (BID) | OROMUCOSAL | Status: DC
  Administered 2016-04-05 – 2016-04-11 (×11): 15 mL via OROMUCOSAL

## 2016-04-05 MED ORDER — ENOXAPARIN SODIUM 40 MG/0.4ML ~~LOC~~ SOLN
40.0000 mg | Freq: Every day | SUBCUTANEOUS | Status: DC
  Administered 2016-04-05 – 2016-04-11 (×7): 40 mg via SUBCUTANEOUS
  Filled 2016-04-05 (×7): qty 0.4

## 2016-04-05 MED ORDER — SODIUM CHLORIDE 0.9 % IV SOLN
INTRAVENOUS | Status: AC
  Administered 2016-04-05 (×2): via INTRAVENOUS

## 2016-04-05 MED ORDER — DEXTROSE 5 % IV SOLN
1.0000 g | Freq: Once | INTRAVENOUS | Status: AC
  Administered 2016-04-05: 1 g via INTRAVENOUS
  Filled 2016-04-05: qty 10

## 2016-04-05 NOTE — Progress Notes (Signed)
New Admission Note:  Arrival Method: Stretcher via [facility]   Mental Orientation: Patient nonverbal at baseline, consistently grunting Telemetry: No order  Assessment: Completed Skin: Assessed with Charito B, RN, skin dry, flaky and with generalized bruising IV: Right upper arm, saline locked Pain: Patient nonverbal, grunting Tubes: N/A Safety Measures: Safety Fall Prevention Plan discussed with patient's mom Admission: Completed [address] Orientation: Patient has been orientated to the room, unit and the staff. Family: Mom at bedside  Orders have been reviewed and implemented. Will continue to monitor the patient. Call light has been placed within reach.Rivka Barbara.   Kymberlyn Eckford BSN, RN  Phone Number: [phone]

## 2016-04-05 NOTE — H&P (Signed)
Family Medicine Teaching [facility] Admission History and Physical Service Pager: [phone]  Patient name: Michele [name] Medical record number: [id number] Date of birth: 04/16/1986 Age: 30 y.o. Gender: female  Primary Care Provider: Delynn Flavin, DO Consultants: none Code Status: Full (discussed on admission)  Chief Complaint: SOB, cough  Assessment and Plan: PEGEEN STIGER is a 30 y.o. female presenting with hypoxia . PMH is significant for infantile cerebral palsy, seizure disorder, profound intellectual disability  # Hypoxia: likely secondary to CAP vs PE.  Desaturations on RA to 89%.  Saturating well on 2L [place]. CXR with what appears to be R basilar pna.  No leukocytosis.  Temp 95.8F. BP soft 89-108/64-66 (this is baseline for patient). LA 0.6. UA neg. Started on Azithromycin, Rocephin in [facility].  S/p 1L bolus in ED.  On exam, patient tired appearing, wheeze/ rhonchi appreciated bilateral bases, Left calf slightly larger than right.  Wells for DVT 2, Wells for PE 3.  Given acute hypoxia and limited mobility, must r/o DVT/ PE. - Place in observation under Dr Deirdre Priest - VS per floor protocol - O2 prn saturation <92%, will wean as able - Continue rocephin/ azithromycin - Monitor temp - repeat fluid bolus then MIVFs - LLE doppler - CTA chest to evaluate for PE - EKG - Zofran, Tylenol prn  # Infantile cerebral palsy/intellectual disability: completely dependent upon mother for ADLs. - OOB with assistance only  # Seizure disorder: on Depakote 1000mg  BID. Keppra 1200mg  BID. Topamax 150mg  BID, Carnitor 0.28mh TID, Banzel susp 5mg  BID. Diastat prn rectal. - Continue home medications - Mother has Banzel with her and can use during hospitalization per pharmacy.  # GERD: on Protonix 10cc daily - Continue Protonix   FEN/GI: Normal diet, PPI, MIVFs for next 12 hours. Prophylaxis: Lovenox sub-q  Disposition: Observation until resolution of hypoxia.  History of Present Illness:  Michele [name] is a 30 y.o. female presenting with cough, chills, hypoxia  Mother reports that patient was evaluated in the office 10/16 for cough, chills, decreased PO intake.  She had a CXR that was unable to visualize any acute processes.  She was sent home with supportive care.  She [facility].  CXR repeated and now with possible right basilar pneumonia.  Mother denies fever but reports chill bumps.  She notes that this has been going no since Monday but that patient started having decreased PO intake since Saturday.  She denies diarrhea, vomiting.  Patient attends a day program.  Mom is unsure as to whether she has sick contacts there.  Additionally, mother reports that [place] has had an increase in the size of her left calf for the last couple of days.  Normally for LE swelling she gives her Lasix but she noted that the swelling was non-pitting, so she did not give it.  No h/o DVT but patient has limited mobility.  No OCPs, tobacco use.  At baseline, Terita is completely dependent upon her mother for ADLs.  She has taken all of her medications as directed and will not be due until later this morning.  Mother has brought these in.  Review Of Systems: Per HPI with the following additions: none  ROS  Patient Active Problem List   Diagnosis Date Noted  . CAP (community acquired pneumonia) 04/05/2016  . Hypoxia 04/05/2016  . Productive cough 04/03/2016  . Stye left upper eyelid 11/25/2015  . Intertriginous candidiasis 09/15/2015  . Rhinitis, non-allergic 06/23/2015  . Special  screening examination for human papillomavirus (HPV) 07/20/2014  . Amenorrhea 05/19/2014  . 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 Long-term use of high-risk medication 12/24/2012  . Morbid obesity (HCC) 12/24/2012  . 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 URINARY INCONTINENCE 02/25/2010  . 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 DIGESTIVE SYSTEM COMPLICATION  NEC 11/16/2009  . Hypothyroidism 01/29/2009  . 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 Infantile cerebral palsy (HCC) 01/29/2009  . GASTROESOPHAGEAL REFLUX DISEASE 01/29/2009  . Osteoporosis 01/29/2009  . Seizure disorder (HCC) 01/29/2009    Past Medical History: Past Medical History:  Diagnosis Date  . Cerebral palsy (HCC)   . Hypothyroidism   . Lennox-Gastaut syndrome (HCC)   . Osteoporosis   . Profound mental retardation   . Reflux   . Rickets   . Seizures (HCC)     Past Surgical History: Past Surgical History:  Procedure Laterality Date  . Gastrocutaneous fistual repair  2012  . PEG TUBE PLACEMENT      Social History: Social History  Substance Use Topics  . Smoking status: Never Smoker  . Smokeless tobacco: Never Used  . Alcohol use No   Additional social history: none  Please also refer to relevant sections of EMR.  Family History: Family History  Problem Relation Age of Onset  . Hypertension Mother   . Heart disease Maternal Grandmother     Died at 28  . Stroke Maternal Grandfather     died at 28  . Hypertension Paternal Grandmother   . Diabetes      MGGM  . Heart disease      MGGF  . Kidney disease      Maternal Great Aunt   . Cancer Maternal Aunt     lung  . Colon cancer Neg Hx    Allergies and Medications: Allergies  Allergen Reactions  . Carbamazepine Rash   No current facility-administered medications on file prior to encounter.    Current Outpatient Prescriptions on File Prior to Encounter  Medication Sig Dispense Refill  . alendronate (FOSAMAX) 70 MG tablet TAKE 1 TABLET BY MOUTH EVERY 7 DAYS ON SATURDAY WITH A FULL GLASS OF WATER ON AN EMPTY STOMACH 12 tablet 0  . BANZEL 40 MG/ML SUSP TAKE 5 MLS BY MOUTH IN THE MORNING AND 5 MLS BY MOUTH AT NIGHT 460 mL 5  . Calcium Carbonate-Vitamin D (CALCARB 600/D) 600-400 MG-UNIT per tablet Take 1 tablet by mouth 2 (two) times daily. 1 tab per tube 2 time a day    . CARNITOR 1 GM/10ML  solution TAKE 5 MLS BY MOUTH THREE TIMES DAILY 510 mL 5  . clotrimazole (LOTRIMIN) 1 % cream APPLY EXTERNALLY TO THE AFFECTED AREA TWICE DAILY 60 g 0  . DEPAKOTE SPRINKLES 125 MG capsule TAKE 8 CAPSULES BY MOUTH TWICE DAILY 496 capsule 5  . DIASTAT ACUDIAL 20 MG GEL DIAL TO 15MG , GIVE RECTALLY FOR SEIZURES LASTING 2 MINUTES OR LONGER 2 Package 5  . fluticasone (FLONASE) 50 MCG/ACT nasal spray SHAKE LIQUID AND USE 2 SPRAYS IN EACH NOSTRIL DAILY 16 g 5  . folic acid (FOLVITE) 1 MG tablet Take 1 mg by mouth every morning. 1 tab per tube daily    . furosemide (LASIX) 20 MG tablet TAKE 1 TABLET(20 MG) BY MOUTH DAILY AS NEEDED (Patient taking differently: TAKE 1 TABLET(20 MG) BY MOUTH DAILY AS NEEDED) 30 tablet 4  . guaiFENesin (MUCINEX) 600 MG 12 hr tablet Take 1 tablet (600 mg total) by  mouth 2 (two) times daily. 20 tablet 0  . hydrocortisone cream 0.5 % Apply 1 application topically 2 (two) times daily. 60 g 0  . KEPPRA 100 MG/ML solution TAKE 12 MLS BY MOUTH EVERY MORNING AND 12 MLS EVERY EVENING 816 mL 5  . lactulose (CHRONULAC) 10 GM/15ML solution TAKE 30 ML BY MOUTH TWICE DAILY 240 mL 11  . NYSTATIN powder APPLY TO AFFECTED DIAPER AREAS WITH EVERY DIAPER CHANGE UNTIL RASH CLEARS 60 g 0  . ondansetron (ZOFRAN) 4 MG/5ML solution TAKE 5 MLS FOR PAIN EVERY 8 HOURS AS NEEDED FOR NAUSEA OR VOMITING 50 mL 11  . pantoprazole sodium (PROTONIX) 40 mg/20 mL PACK Place 10 mLs (20 mg total) into feeding tube daily. 30 each 11  . TOPAMAX SPRINKLE 25 MG capsule TAKE 6 CAPSULES BY MOUTH IN THE MORNING AND 6 CAPSULES BY MOUTH IN THE EVENING WITH FOOD 408 capsule 5    Objective: BP (!) 84/34 (BP Location: Left Arm)   Pulse 91   Temp 97.4 F (36.3 C) (Axillary)   Resp 20   Wt 197 lb 4.8 oz (89.5 kg)   SpO2 100%   BMI 39.85 kg/m  Exam: General: sleeping, wakes to stimuli but appears tired, mother at bedside Eyes: sclera white ENTM: MMM, dried rhinorrhea present Neck: supple Cardiovascular: RRR, no  murmurs Respiratory: diffuse rhonchi, expiratory wheeze most prominent at the bases, on 2L [place], normal WOB Gastrointestinal: obese, soft, NT/ND, +BS MSK: Left calf slightly larger than right, no palpable cords, no increased warmth, seemingly nontender but difficult to assess Derm: dry, in tact, no rashes Neuro: wakes to gentle stimuli but does not follow commands (patient has difficulty following commands at baseline without significant direction), does not have normal speech at baseline Psych: not assessed.  Labs and Imaging: Results for orders placed or performed during the hospital encounter of 04/04/16 (from the past 24 hour(s))  CBG monitoring, ED     Status: None   Collection Time: 04/04/16 10:53 PM  Result Value Ref Range   Glucose-Capillary 93 65 - 99 mg/dL  CBC     Status: Abnormal   Collection Time: 04/04/16 11:54 PM  Result Value Ref Range   WBC 9.9 4.0 - 10.5 K/uL   RBC 3.48 (L) 3.87 - 5.11 MIL/uL   Hemoglobin 12.5 12.0 - 15.0 g/dL   HCT 16.138.9 09.636.0 - 04.546.0 %   MCV 111.8 (H) 78.0 - 100.0 fL   MCH 35.9 (H) 26.0 - 34.0 pg   MCHC 32.1 30.0 - 36.0 g/dL   RDW 40.918.3 (H) 81.111.5 - 91.415.5 %   Platelets 170 150 - 400 K/uL  Lactic acid, plasma     Status: None   Collection Time: 04/04/16 11:54 PM  Result Value Ref Range   Lactic Acid, Venous 1.0 0.5 - 1.9 mmol/L  Lactic acid, plasma     Status: None   Collection Time: 04/05/16 12:50 AM  Result Value Ref Range   Lactic Acid, Venous 0.6 0.5 - 1.9 mmol/L  Comprehensive metabolic panel     Status: Abnormal   Collection Time: 04/05/16 12:50 AM  Result Value Ref Range   Sodium 143 135 - 145 mmol/L   Potassium 4.6 3.5 - 5.1 mmol/L   Chloride 111 101 - 111 mmol/L   CO2 27 22 - 32 mmol/L   Glucose, Bld 95 65 - 99 mg/dL   BUN 21 (H) 6 - 20 mg/dL   Creatinine, Ser 7.820.67 0.44 - 1.00 mg/dL   Calcium 8.8 (  L) 8.9 - 10.3 mg/dL   Total Protein 7.0 6.5 - 8.1 g/dL   Albumin 3.1 (L) 3.5 - 5.0 g/dL   AST 41 15 - 41 U/L   ALT 19 14 - 54 U/L    Alkaline Phosphatase 75 38 - 126 U/L   Total Bilirubin 1.0 0.3 - 1.2 mg/dL   GFR calc non Af Amer >60 >60 mL/min   GFR calc Af Amer >60 >60 mL/min   Anion gap 5 5 - 15    Dg Chest 2 View  Result Date: 04/05/2016 CLINICAL DATA:  Acute onset of cough and concern for generalized chest pain. Initial encounter. EXAM: CHEST  2 VIEW COMPARISON:  Chest radiograph performed 04/03/2016 FINDINGS: The lungs are hypoexpanded. Mild right basilar opacity may reflect pneumonia. Vascular crowding is noted. No pneumothorax or significant pleural effusion is seen. The cardiomediastinal silhouette is borderline normal in size. No acute osseous abnormalities are seen. IMPRESSION: Lungs hypoexpanded. Mild right basilar airspace opacity may reflect pneumonia. Electronically Signed   By: Roanna Raider M.D.   On: 04/05/2016 01:23   Raliegh Ip, DO 04/05/2016, 4:57 AM PGY-3, [place] [facility] FPTS Intern pager: [phone], text pages welcome

## 2016-04-05 NOTE — ED Notes (Signed)
Called care link [facility] Rn  [id number]

## 2016-04-05 NOTE — Progress Notes (Signed)
Preliminary results by tech: Left lower extremity duplex complete. Left lower extremity negative for deep and superficial vein thrombosis. Negative for left sided baker cysts. Levin Baconlaire Nile Prisk- RDMS, RVT 9:28 AM  04/05/2016

## 2016-04-06 ENCOUNTER — Inpatient Hospital Stay (HOSPITAL_COMMUNITY): Payer: Medicaid Other

## 2016-04-06 DIAGNOSIS — R2681 Unsteadiness on feet: Secondary | ICD-10-CM

## 2016-04-06 LAB — CBC
HCT: 29.8 % — ABNORMAL LOW (ref 36.0–46.0)
HEMOGLOBIN: 9 g/dL — AB (ref 12.0–15.0)
MCH: 34.6 pg — AB (ref 26.0–34.0)
MCHC: 30.5 g/dL (ref 30.0–36.0)
MCV: 113.3 fL — AB (ref 78.0–100.0)
Platelets: 165 10*3/uL (ref 150–400)
RBC: 2.63 MIL/uL — ABNORMAL LOW (ref 3.87–5.11)
RDW: 18.2 % — ABNORMAL HIGH (ref 11.5–15.5)
WBC: 13.4 10*3/uL — ABNORMAL HIGH (ref 4.0–10.5)

## 2016-04-06 LAB — BASIC METABOLIC PANEL
Anion gap: 7 (ref 5–15)
BUN: 14 mg/dL (ref 6–20)
CHLORIDE: 113 mmol/L — AB (ref 101–111)
CO2: 24 mmol/L (ref 22–32)
Calcium: 7.9 mg/dL — ABNORMAL LOW (ref 8.9–10.3)
Creatinine, Ser: 0.88 mg/dL (ref 0.44–1.00)
GFR calc Af Amer: >60 mL/min (ref >60)
GFR calc non Af Amer: >60 mL/min (ref >60)
GLUCOSE: 81 mg/dL (ref 65–99)
POTASSIUM: 3.3 mmol/L — AB (ref 3.5–5.1)
SODIUM: 144 mmol/L (ref 135–145)

## 2016-04-06 LAB — URINE CULTURE: CULTURE: NO GROWTH

## 2016-04-06 LAB — LACTIC ACID, PLASMA
LACTIC ACID, VENOUS: 0.8 mmol/L (ref 0.5–1.9)
Lactic Acid, Venous: 0.8 mmol/L (ref 0.5–1.9)

## 2016-04-06 LAB — PROCALCITONIN: Procalcitonin: 0.39 ng/mL

## 2016-04-06 MED ORDER — RESOURCE THICKENUP CLEAR PO POWD
ORAL | Status: DC | PRN
  Filled 2016-04-06: qty 125

## 2016-04-06 MED ORDER — DEXTROSE 5 % IV SOLN
1.0000 g | Freq: Three times a day (TID) | INTRAVENOUS | Status: DC
  Filled 2016-04-06 (×3): qty 1

## 2016-04-06 MED ORDER — VANCOMYCIN HCL IN DEXTROSE 1-5 GM/200ML-% IV SOLN
1000.0000 mg | Freq: Three times a day (TID) | INTRAVENOUS | Status: DC
  Administered 2016-04-06 – 2016-04-07 (×5): 1000 mg via INTRAVENOUS
  Filled 2016-04-06 (×6): qty 200

## 2016-04-06 MED ORDER — PIPERACILLIN-TAZOBACTAM 3.375 G IVPB
3.3750 g | Freq: Three times a day (TID) | INTRAVENOUS | Status: DC
  Administered 2016-04-06 – 2016-04-08 (×6): 3.375 g via INTRAVENOUS
  Filled 2016-04-06 (×8): qty 50

## 2016-04-06 MED ORDER — SODIUM CHLORIDE 0.9 % IV SOLN
INTRAVENOUS | Status: DC
  Administered 2016-04-06: 19:00:00 via INTRAVENOUS
  Filled 2016-04-06 (×2): qty 1000

## 2016-04-06 NOTE — Evaluation (Signed)
Clinical/Bedside Swallow Evaluation Patient Details  Name: Michele [name] MRN: [id number] Date of Birth: Oct 28, 1985  Today's Date: 04/06/2016 Time: SLP Start Time (ACUTE ONLY): 1010 SLP Stop Time (ACUTE ONLY): 1035 SLP Time Calculation (min) (ACUTE ONLY): 25 min  Past Medical History:  Past Medical History:  Diagnosis Date  . Cerebral palsy (HCC)   . Hypothyroidism   . Lennox-Gastaut syndrome (HCC)   . Osteoporosis   . Profound mental retardation   . Reflux   . Rickets   . Seizures (HCC)    Past Surgical History:  Past Surgical History:  Procedure Laterality Date  . Gastrocutaneous fistual repair  2012  . PEG TUBE PLACEMENT     HPI:  Michele [name] a 30 y.o.femalepresenting with hypoxia. PMH is significant for infantile cerebral palsy, seizure disorder, profound intellectual disability. CXR with what appears to be R basilar pna, but CT shows Consolidative changes involving the left upper lobe and lower lobes   Assessment / Plan / Recommendation Clinical Impression  SLP observed Mother feed pt peaches with a spoon and thin water via straw. She reports Michele [name] usually drinks well from a sippy cup, tolerates regular foods and has not had difficulty with choking or dysphagia in the past. Prior PEG tube was needed for nutritional support because patient "was not eating."  Pt observed to be moderately lethargic with oral dysphagia charaterized by reduced lingual mobility and coordination with a thrusting mechanism used for transit. Swallow response is delayed with 2 hard coughs and watery eyes over 8 oz of intake. Suspect that pt typically tolerates her liquids when she is alert and drinking from her sippy cup, but due to lethargy and larger boluses being provided with a straw, pt is having intermittent aspiration events. Attempted a trial of nectar thick liquids, but pt would not accept any more PO. Recommend mother thicken pts liquids the rest of the day, though will keep ordered diet as  regular thin so that pts foods and drinks of choice will still arrive. Will attempt trials again tomorrow with her sippy cup and determine tolerance or need for objective testing. Mother in agreement.     Aspiration Risk  Moderate aspiration risk    Diet Recommendation Regular;Nectar-thick liquid   Liquid Administration via: Straw Medication Administration: Other (Comment) (as tolerated) Supervision: Comment (mother to feed) Compensations: Slow rate;Small sips/bites    Other  Recommendations Oral Care Recommendations: Oral care BID Other Recommendations: Order thickener from pharmacy   Follow up Recommendations 24 hour supervision/assistance      Frequency and Duration min 2x/week  2 weeks       Prognosis Prognosis for Safe Diet Advancement: Good      Swallow Study   General HPI: Michele [name] a 30 y.o.femalepresenting with hypoxia. PMH is significant for infantile cerebral palsy, seizure disorder, profound intellectual disability. CXR with what appears to be R basilar pna, but CT shows Consolidative changes involving the left upper lobe and lower lobes Type of Study: Bedside Swallow Evaluation Diet Prior to this Study: Regular;Thin liquids Temperature Spikes Noted: No Respiratory Status: Room air History of Recent Intubation: No Behavior/Cognition: Lethargic/Drowsy Oral Care Completed by SLP: No Oral Cavity - Dentition: Missing dentition Self-Feeding Abilities: Total assist Patient Positioning: Partially reclined (in chair) Baseline Vocal Quality: Not observed Volitional Cough: Cognitively unable to elicit Volitional Swallow: Unable to elicit    Oral/Motor/Sensory Function Overall Oral Motor/Sensory Function:  (does not follow commands)   Ice Chips Ice chips: Not tested   Thin  Liquid Thin Liquid: Impaired Presentation: Straw Pharyngeal  Phase Impairments: Suspected delayed Swallow;Cough - Immediate;Cough - Delayed    Nectar Thick Nectar Thick Liquid:  (attempted,  pt refused)   Honey Thick     Puree Puree: Not tested   Solid   GO   Solid: Impaired Presentation: Spoon Oral Phase Impairments: Impaired mastication;Reduced labial seal       Harlon Ditty, MA CCC-SLP [phone]  [name], Michele [name] 04/06/2016,10:46 AM

## 2016-04-06 NOTE — Evaluation (Signed)
Physical Therapy Evaluation Patient Details Name: Michele [name] MRN: [id number] DOB: 04/16/1986 Today's Date: 04/06/2016   History of Present Illness  Michele [name] is a 30 y.o. female presenting with hypoxia and found to have PNA. PMH is significant for infantile cerebral palsy, seizure disorder, profound intellectual disability  Clinical Impression  Pt admitted with above diagnosis. Pt currently with functional limitations due to the deficits listed below (see PT Problem List). Pt will benefit from skilled PT to increase their independence and safety with mobility to allow discharge to the venue listed below.  Pt currently needing more A than normal and will follow acutely to work on getting pt stronger to decrease burden of care on her mother. Recommend HHPT as well.     Follow Up Recommendations Home health PT    Equipment Recommendations  None recommended by PT    Recommendations for Other Services       Precautions / Restrictions Precautions Precautions: Fall      Mobility  Bed Mobility Overal bed mobility: Needs Assistance;+2 for physical assistance Bed Mobility: Supine to Sit     Supine to sit: Max assist;+2 for physical assistance     General bed mobility comments: Pt initially lethargic not opening eyes, so PT and mom transferred her to EOB, and she woke up  Transfers Overall transfer level: Needs assistance Equipment used: 2 person hand held assist Transfers: Sit to/from [employer] Sit to Stand: Mod assist;+2 physical assistance Stand pivot transfers: Mod assist;+2 physical assistance       General transfer comment: Sit <> stand, SPT with HHA of 2  Ambulation/Gait                Stairs            Wheelchair Mobility    Modified Rankin (Stroke Patients Only)       Balance Overall balance assessment: Needs assistance Sitting-balance support: Feet unsupported;Bilateral upper extremity supported Sitting balance-Leahy  Scale: Fair Sitting balance - Comments: Pt able to sit EOB with min/guard with B UE and feet off of the floor due to short height     Standing balance-Leahy Scale: Poor                               Pertinent Vitals/Pain Pain Assessment: Faces Faces Pain Scale: No hurt    Home Living Family/patient expects to be discharged to:: Private residence Living Arrangements: Parent Available Help at Discharge: Family;Available 24 hours/day Type of Home: House Home Access: Ramped entrance     Home Layout: One level Home Equipment: Walker - 2 wheels;Wheelchair - manual;Hospital bed;Other (comment);Shower seat (hoyer lift) Additional Comments: Michiel Sites lift is used if pt falls or for when pt is weak, but normally transfers with mother's A     Prior Function Level of Independence: Needs assistance   Gait / Transfers Assistance Needed: She goes to adult day program M-F. Amb short household distances.  Mom reports she doesn't use the walker, but cruises furniture           Hand Dominance        Extremity/Trunk Assessment   Upper Extremity Assessment: Generalized weakness           Lower Extremity Assessment: Generalized weakness         Communication   Communication: Other (comment) (pt non-verbal)  Cognition Arousal/Alertness: Awake/alert Behavior During Therapy: Flat affect Overall Cognitive Status: History of cognitive impairments -  at baseline                      General Comments      Exercises     Assessment/Plan    PT Assessment Patient needs continued PT services  PT Problem List Decreased strength;Decreased activity tolerance;Decreased balance;Decreased mobility;Decreased cognition;Decreased safety awareness          PT Treatment Interventions Gait training;Functional mobility training;Therapeutic activities;Therapeutic exercise;Balance training;Patient/family education    PT Goals (Current goals can be found in the Care Plan  section)  Acute Rehab PT Goals Patient Stated Goal: Mother's goal is for pt to return home PT Goal Formulation: With family Time For Goal Achievement: 04/13/16 Potential to Achieve Goals: Good    Frequency Min 3X/week   Barriers to discharge        Co-evaluation               End of Session Equipment Utilized During Treatment: Gait belt;Oxygen Activity Tolerance: Patient tolerated treatment well Patient left: in chair;with call bell/phone within reach;with family/visitor present;Other (comment) (mother aware to let staff no if she leaves, declined chair alarm) Nurse Communication: Mobility status         Time: [id number] PT Time Calculation (min) (ACUTE ONLY): 23 min   Charges:   PT Evaluation $PT Eval Moderate Complexity: 1 Procedure PT Treatments $Therapeutic Activity: 8-22 mins   PT G Codes:        Aarush Stukey LUBECK 04/06/2016, 10:24 AM

## 2016-04-06 NOTE — Progress Notes (Signed)
Family Medicine Teaching Service Daily Progress Note Intern Pager: [phone]  Patient name: Michele [name] Medical record number: [id number] Date of birth: 23-Dec-1985 Age: 30 y.o. Gender: female  Primary Care Provider: Delynn FlavinAshly Gottschalk, DO Consultants: none Code Status: Full    Assessment and Plan: Michele PineRobin C Ledee is a 30 y.o. female presenting with hypoxia . PMH is significant for infantile cerebral palsy, seizure disorder, profound intellectual disability  # Hypoxia, acute Likely secondary to CAP currently on 2L [place] with no prior oxygen requirement. Febrile overnight 101.3, tachycardic to 110 with BP 84/21. Clinical picture consistent with sepsis. Repeat CXR showed dense consolidation in the left lower lobe consistent with pneumonia. Antibiotic coverage was broadened and Blood cultures were send.  Lactic acid was 0.8. --Monitor vitals --F/u on blood cultures --Start on Vancomycin and Zosyn  --F/u on CXR --Tylenol 650 mg q6 prn --Zofran 4 mg q6   # Infantile cerebral palsy/intellectual disability: completely dependent upon mother for ADLs. - OOB with assistance only  # Seizure disorder: on Depakote 1000mg  BID. Keppra 1200mg  BID. Topamax 150mg  BID, Carnitor 0.755mh TID, Banzel susp 5mg  BID. Diastat prn rectal. - Continue home medications - Mother has Banzel with her and can use during hospitalization per pharmacy.  # GERD: on Protonix 10cc daily - Continue Protonix   Disposition: Will transfer patient to stepdown unit for further management of pneumonia possible sepsis.  Subjective:  Overnight, patient was febrile, tachycardic and clinically did not improve after 24 hrs of antibiotics and IV fluids. Patient has very poor po intake. Patient is not responsive 2/2 CP. Mum is bedside and attentive.  Objective: Temp:  [96.4 F (35.8 C)-101.3 F (38.5 C)] 100.5 F (38.1 C) (10/19 0606) Pulse Rate:  [95-110] 110 (10/19 0606) Resp:  [16-19] 16 (10/19 0606) BP: (84-94)/(21-63) 94/35  (10/19 0606) SpO2:  [94 %-100 %] 96 % (10/19 0606) Weight:  [197 lb 8.5 oz (89.6 kg)] 197 lb 8.5 oz (89.6 kg) (10/18 2108)   Physical Exam:  General: sleeping, wakes to stimuli but appears tired, mother at bedside Eyes: sclera white ENTM: MMM, dried rhinorrhea present Neck: supple Cardiovascular: RRR, no murmurs Respiratory: diffuse rhonchi, expiratory wheeze most prominent at the bases, on 2L [place], normal WOB Gastrointestinal: obese, soft, NT/ND, +BS MSK: Left calf slightly larger than right, no palpable cords, no increased warmth, seemingly nontender but difficult to assess Derm: dry, in tact, no rashes Neuro: wakes to gentle stimuli but does not follow commands (patient has difficulty following commands at baseline without significant direction), does not have normal speech at baseline  Laboratory:  Recent Labs Lab 04/04/16 2354 04/05/16 0718  WBC 9.9 5.3  HGB 12.5 12.6  HCT 38.9 40.8  PLT 170 125*    Recent Labs Lab 04/05/16 0050 04/05/16 0718  NA 143 142  K 4.6 3.7  CL 111 111  CO2 27 25  BUN 21* 15  CREATININE 0.67 0.72  CALCIUM 8.8* 8.3*  PROT 7.0 5.9*  BILITOT 1.0 0.3  ALKPHOS 75 65  ALT 19 17  AST 41 27  GLUCOSE 95 98    Imaging/Diagnostic Tests: Dg Chest Port 1 View  Result Date: 04/06/2016 CLINICAL DATA:  Pneumonia. EXAM: PORTABLE CHEST 1 VIEW COMPARISON:  CT 04/05/2016.  Chest x-ray 04/05/2016. FINDINGS: Borderline cardiomegaly. Multifocal pulmonary infiltrates are noted with infiltrates in the left upper lobe and both lower lobes. Left lower lobe consolidation is particularly prominent. Component of atelectasis most likely present. Small left pleural effusion. No pneumothorax. IMPRESSION: 1. Persistent multifocal  pulmonary infiltrates with particularly dense consolidation in the left lower lobe consistent with pneumonia. Small left pleural effusion cannot be excluded. 2. Borderline cardiomegaly. Electronically Signed   By: Maisie Fus  Register   On:  04/06/2016 08:47     Lovena Neighbours, MD 04/06/2016, 7:22 AM PGY-1, [facility] Family Medicine FPTS Intern pager: [phone], text pages welcome

## 2016-04-06 NOTE — Progress Notes (Addendum)
Pharmacy Antibiotic Note  Michele [name] is a 30 y.o. female admitted on 04/04/2016 with hypoxia.  PMH is significant for infantile cerebral palsy, seizure disorder, profound intellectual disability Pharmacy has been consulted for Vancomycin and Zosyn dosing Bacteremia and aspiration pneumonia/HCAP. WBC increased to 13.4k today.  Tc 99.1, Tm 100.5 this morning.  Lactate 1.0>0.6>0.8 SCr 0.88,  Estimated CrCl ~ 90 ml/min  Plan: Zosyn 3.375 g IV q8h infuse each dose over 4hrs Vancomycin 1g IV q8h  Check vancomycin trough at steady state- will check tough tomorrow 04/07/16 evening.   Height: 4\' 11"  (149.9 cm) Weight: 197 lb 8.5 oz (89.6 kg) IBW/kg (Calculated) : 43.2  Temp (24hrs), Avg:99.3 F (37.4 C), Min:97.4 F (36.3 C), Max:101.3 F (38.5 C)   Recent Labs Lab 04/04/16 2354 04/05/16 0050 04/05/16 0718 04/06/16 0813 04/06/16 1038  WBC 9.9  --  5.3 13.4*  --   CREATININE  --  0.67 0.72 0.88  --   LATICACIDVEN 1.0 0.6  --  0.8 0.8    Estimated Creatinine Clearance: 92 mL/min (by C-G formula based on SCr of 0.88 mg/dL).    Allergies  Allergen Reactions  . Carbamazepine Rash    Antimicrobials this admission: 10/18  Azithromycin  >>10/19** 10/18 Ceftriaxone >> 10/19 10/19 Vancomycin >> 10/19 Zosyn>>  Dose adjustments this admission:   Microbiology results: 10/19  BCx: sent 10/17 UCx: neg/F    Thank you for allowing pharmacy to be a part of this patient's care. Noah Delaineuth Azayla Polo, RPh Clinical Pharmacist Pager: [phone] 04/06/2016 1:00 PM

## 2016-04-07 LAB — CBC
HEMATOCRIT: 29.7 % — AB (ref 36.0–46.0)
HEMOGLOBIN: 9 g/dL — AB (ref 12.0–15.0)
MCH: 34.5 pg — ABNORMAL HIGH (ref 26.0–34.0)
MCHC: 30.3 g/dL (ref 30.0–36.0)
MCV: 113.8 fL — ABNORMAL HIGH (ref 78.0–100.0)
Platelets: 164 10*3/uL (ref 150–400)
RBC: 2.61 MIL/uL — ABNORMAL LOW (ref 3.87–5.11)
RDW: 18.3 % — ABNORMAL HIGH (ref 11.5–15.5)
WBC: 12.8 10*3/uL — AB (ref 4.0–10.5)

## 2016-04-07 LAB — BASIC METABOLIC PANEL
ANION GAP: 7 (ref 5–15)
BUN: 12 mg/dL (ref 6–20)
CHLORIDE: 113 mmol/L — AB (ref 101–111)
CO2: 24 mmol/L (ref 22–32)
Calcium: 7.8 mg/dL — ABNORMAL LOW (ref 8.9–10.3)
Creatinine, Ser: 0.84 mg/dL (ref 0.44–1.00)
GFR calc non Af Amer: >60 mL/min (ref >60)
Glucose, Bld: 64 mg/dL — ABNORMAL LOW (ref 65–99)
Potassium: 3.2 mmol/L — ABNORMAL LOW (ref 3.5–5.1)
Sodium: 144 mmol/L (ref 135–145)

## 2016-04-07 LAB — VANCOMYCIN, TROUGH: Vancomycin Tr: 35 ug/mL (ref 15–20)

## 2016-04-07 LAB — MAGNESIUM: MAGNESIUM: 1.9 mg/dL (ref 1.7–2.4)

## 2016-04-07 MED ORDER — POTASSIUM CHLORIDE 20 MEQ/15ML (10%) PO SOLN
40.0000 meq | Freq: Once | ORAL | Status: AC
  Administered 2016-04-07: 40 meq via ORAL
  Filled 2016-04-07: qty 30

## 2016-04-07 MED ORDER — VANCOMYCIN HCL IN DEXTROSE 750-5 MG/150ML-% IV SOLN
750.0000 mg | Freq: Two times a day (BID) | INTRAVENOUS | Status: DC
  Filled 2016-04-07: qty 150

## 2016-04-07 NOTE — Progress Notes (Signed)
Pharmacy Antibiotic Note  Michele PineRobin C [name] is a 30 y.o. female admitted on 04/04/2016 with hypoxia.  PMH is significant for infantile cerebral palsy, seizure disorder, profound intellectual disability Pharmacy has been consulted for Vancomycin and Zosyn dosing Bacteremia and aspiration pneumonia/HCAP. ZOX09.6WBC12.8.  Tm 99.9, Lactate 1.0>0.6>0.8  SCr 0.84, but baseline ~ 0.6 - 0.7, might be falsely low. Vancomycin trough = 35, supratherapeutic, per RN, 1800 dose was given after level was drawn. Est. Ke = 0.053, t1/2 ~ 13 hrs, Vd ~ 60 L. Estimated level after 1800 dose ~ 50, theoretically takes ~ 20 hrs for level to decrease to ~ 17.  Plan: Change vancomycin to 750 mg IV Q 12 hrs, next dose tomorrow at 1400 Monitor renal function closely, ReCheck vancomycin trough at steady state.   Height: 4\' 11"  (149.9 cm) Weight: 197 lb 8.5 oz (89.6 kg) IBW/kg (Calculated) : 43.2  Temp (24hrs), Avg:98.9 F (37.2 C), Min:98.2 F (36.8 C), Max:99.9 F (37.7 C)   Recent Labs Lab 04/04/16 2354 04/05/16 0050 04/05/16 0718 04/06/16 0813 04/06/16 1038 04/07/16 0504 04/07/16 1822  WBC 9.9  --  5.3 13.4*  --  12.8*  --   CREATININE  --  0.67 0.72 0.88  --  0.84  --   LATICACIDVEN 1.0 0.6  --  0.8 0.8  --   --   VANCOTROUGH  --   --   --   --   --   --  35*    Estimated Creatinine Clearance: 96.4 mL/min (by C-G formula based on SCr of 0.84 mg/dL).    Allergies  Allergen Reactions  . Carbamazepine Rash    Antimicrobials this admission: 10/18  Azithromycin  >>10/19** 10/18 Ceftriaxone >> 10/19 10/19 Vancomycin >> 10/19 Zosyn>>  Dose adjustments this admission: 10/20: Vancomycin trough = 35, changed to 750 Q 12hrs  Microbiology results: 10/19  BCx: sent 10/17 UCx: neg/F    Thank you for allowing pharmacy to be a part of this patient's care.  Bayard HuggerMei Annalysa Mohammad, PharmD, BCPS  Clinical Pharmacist  Pager: [phone]   04/07/2016 8:29 PM

## 2016-04-07 NOTE — Progress Notes (Signed)
Family Medicine Teaching Service Daily Progress Note Intern Pager: [phone]  Patient name: Michele [name] [place] Medical record number: [id number] Date of birth: 05-13-1986 Age: 30 y.o. Gender: female  Primary Care Provider: Delynn FlavinAshly Gottschalk, DO Consultants: none Code Status: Full    Assessment and Plan: Michele [name] Bolser is a 30 y.o. female presenting with hypoxia . PMH is significant for infantile cerebral palsy, seizure disorder, profound intellectual disability  # Hypoxia, improving Likely secondary to CAP currently on 2L [place] with no prior oxygen requirement. Patient vitals have been stable in the past 24 hrs she is afebrile and BP is back to normal. Antibiotic coverage was broadened to vancomycin and zosyn. Patient will be transitioned to po barring worsening clinical picture. Blood cultures NGTD. --Monitor vitals --Wean as tolerated --Continue Vancomycin and Zosyn --Transition to augmentin 875 mg bid --Tylenol 650 mg q6 prn --Zofran 4 mg q6  --Bolus as needed --IVF prn patient po improved overnight  # Infantile cerebral palsy/intellectual disability: completely dependent upon mother for ADLs. - OOB with assistance only  # Seizure disorder: on Depakote 1000mg  BID. Keppra 1200mg  BID. Topamax 150mg  BID, Carnitor 0.555mh TID, Banzel susp 5mg  BID. Diastat prn rectal. - Continue home medications - Mother has Banzel with her and can use during hospitalization per pharmacy.  # GERD: on Protonix 10cc daily - Continue Protonix   Disposition: Will transfer patient to stepdown unit for further management of pneumonia possible sepsis.  Subjective:  No acute events overnight. Patient has improved, she taking more po and has been more interactive. Breathing has improving and she continue to be afebrile.   Objective: Temp:  [98.5 F (36.9 [name])-99.9 F (37.7 [name])] 98.8 F (37.1 [name]) (10/20 0844) Pulse Rate:  [92-107] 101 (10/20 0844) Resp:  [16-19] 18 (10/20 0844) BP: (86-105)/(38-55) 102/49 (10/20  0844) SpO2:  [93 %-100 %] 99 % (10/20 0844)   Physical Exam:  General: sleeping, wakes to stimuli but appears tired, mother at bedside Eyes: sclera white ENTM: MMM, dried rhinorrhea present Neck: supple Cardiovascular: RRR, no murmurs Respiratory: diffuse rhonchi, expiratory wheeze most prominent at the bases, on 2L [place], normal WOB Gastrointestinal: obese, soft, NT/ND, +BS MSK: Left calf slightly larger than right, no palpable cords, no increased warmth, seemingly nontender but difficult to assess Derm: dry, in tact, no rashes Neuro: wakes to gentle stimuli but does not follow commands (patient has difficulty following commands at baseline without significant direction), does not have normal speech at baseline  Laboratory:  Recent Labs Lab 04/05/16 0718 04/06/16 0813 04/07/16 0504  WBC 5.3 13.4* 12.8*  HGB 12.6 9.0* 9.0*  HCT 40.8 29.8* 29.7*  PLT 125* 165 164    Recent Labs Lab 04/05/16 0050 04/05/16 0718 04/06/16 0813 04/07/16 0504  NA 143 142 144 144  K 4.6 3.7 3.3* 3.2*  CL 111 111 113* 113*  CO2 27 25 24 24   BUN 21* 15 14 12   CREATININE 0.67 0.72 0.88 0.84  CALCIUM 8.8* 8.3* 7.9* 7.8*  PROT 7.0 5.9*  --   --   BILITOT 1.0 0.3  --   --   ALKPHOS 75 65  --   --   ALT 19 17  --   --   AST 41 27  --   --   GLUCOSE 95 98 81 64*    Imaging/Diagnostic Tests: No results found.   Lovena NeighboursAbdoulaye Elga Santy, MD 04/07/2016, 9:55 AM PGY-1, [facility] FPTS Intern pager: [phone], text pages welcome

## 2016-04-07 NOTE — Progress Notes (Signed)
Speech Language Pathology Treatment:    Patient Details Name: Michele [name] MRN: [id number] DOB: 15-Feb-1986 Today's Date: 04/07/2016 Time: [id number] SLP Time Calculation (min) (ACUTE ONLY): 29 min  Assessment / Plan / Recommendation Clinical Impression  SLP observed pt with one sip of liquid from a straw and while being given oral medications from mother. The pt continues to be awake, but lethargic, unwilling to take more than one sip of juice from her cup, which is a straw cup, not a sippy cup. Pt tolerated single sip well. However, pts mother had to force feed her her medications; pt tightly sealed her mandible. Many of her medications are in liquid form. Mother forces her jaws open and tosses a small med cup full of liquid into her mouth, which pt does choke on and sometimes holds orally resulting in delayed coughing. Pts mother states she is usually more willing to take her medications, but does often resist as well.  Suspect this is an acute dysphagia, resulting from current infection due to total assist feeding with lethargy and increased weakness and poor pt awareness of PO.  This is a difficult situation as pt is not capable of following any compensatory strategies or participating in an MBS.  Mother feels Michele [name] will not drink nectar thick liquids. It is difficult to determine the severity of pts penetration or aspiration events. Her cough is strong and suspect sensation is in tact, though this is subjective and unreliable. She needs her medications, but her mother would not want to attempt an NG tube to reduce risk of aspiration until pts arousal improves. For now mother seemed to want to continue her current methods and would not fully participate in decision making. Pt is at moderate risk of aspiration as it stands.    HPI HPI: Michele [name] a 30 y.o.femalepresenting with hypoxia. PMH is significant for infantile cerebral palsy, seizure disorder, profound intellectual disability. CXR with  what appears to be R basilar pna, but CT shows Consolidative changes involving the left upper lobe and lower lobes      SLP Plan  Continue with current plan of care     Recommendations  Diet recommendations: Regular;Thin liquid;Nectar-thick liquid Liquids provided via: Straw Medication Administration: Crushed with puree (thicken liquids to nectar) Compensations: Slow rate;Small sips/bites Postural Changes and/or Swallow Maneuvers: Seated upright 90 degrees;Upright 30-60 min after meal                Oral Care Recommendations: Oral care QID Follow up Recommendations: 24 hour supervision/assistance Plan: Continue with current plan of care       GO                Artha Stavros, Riley NearingBonnie Caroline 04/07/2016, 1:12 PM

## 2016-04-07 NOTE — Plan of Care (Signed)
Problem: Safety: Goal: Ability to remain free from injury will improve Outcome: Progressing Talked to mother about calling before getting pt up or transferring from bed to chair. Mother demonstrated understanding through explanation and has called for help when repositioning pt is required.

## 2016-04-07 NOTE — Progress Notes (Signed)
Physical Therapy Treatment Patient Details Name: Michele [name] MRN: [id number] DOB: May 16, 1986 Today's Date: 04/07/2016    History of Present Illness Michele [name] is a 30 y.o. female presenting with hypoxia and found to have PNA. PMH is significant for infantile cerebral palsy, seizure disorder, profound intellectual disability    PT Comments    Pt needing increased A today with transfers with decreased WB with transfer and increased lethargy during session.  Mother feels she is more swollen in her extremities today.  Will con't to follow acutely.  Follow Up Recommendations  Home health PT     Equipment Recommendations  None recommended by PT    Recommendations for Other Services       Precautions / Restrictions Restrictions Weight Bearing Restrictions: No    Mobility  Bed Mobility Overal bed mobility: Needs Assistance;+2 for physical assistance Bed Mobility: Supine to Sit Rolling: Max assist   Supine to sit: Max assist;+2 for physical assistance     General bed mobility comments: Pt more lethargic today. Use of bed pad to get her to EOb  Transfers Overall transfer level: Needs assistance Equipment used: 2 person hand held assist Transfers: Sit to/from [employer] Sit to Stand: Max assist;+2 physical assistance Stand pivot transfers: Max assist;+2 physical assistance       General transfer comment: MAX A of 2 today with pt not WB as much as yesterday  Ambulation/Gait                 Stairs            Wheelchair Mobility    Modified Rankin (Stroke Patients Only)       Balance                                    Cognition Arousal/Alertness: Lethargic Behavior During Therapy: Flat affect Overall Cognitive Status: History of cognitive impairments - at baseline                      Exercises      General Comments General comments (skin integrity, edema, etc.): Mother feels pt is more swollen  throughout extremities today      Pertinent Vitals/Pain Faces Pain Scale: No hurt    Home Living                      Prior Function            PT Goals (current goals can now be found in the care plan section) Acute Rehab PT Goals Patient Stated Goal: Mother's goal is for pt to return home PT Goal Formulation: With family Time For Goal Achievement: 04/13/16 Potential to Achieve Goals: Good Progress towards PT goals: Not progressing toward goals - comment (pt needing increased A today)    Frequency    Min 3X/week      PT Plan Current plan remains appropriate    Co-evaluation             End of Session Equipment Utilized During Treatment: Gait belt;Oxygen Activity Tolerance: Patient limited by fatigue Patient left: in chair;with call bell/phone within reach;with nursing/sitter in room     Time: 1202-1219 PT Time Calculation (min) (ACUTE ONLY): 17 min  Charges:  $Therapeutic Activity: 8-22 mins                    G  Codes:      Michele [name] 04/07/2016, 1:02 PM

## 2016-04-07 NOTE — Progress Notes (Signed)
Pt is non verbal.  Mother was at the bedside today and helps with medication administration. This aids in calming of pt if anxious.   Pt is dependent on mother for ADLs.  No needs identified at this time.   Doctor was also alerted about edema in legs and hips. Discontinued continuous IV fluids.   Michele ReevesNatalie N Richard Ritchey, RN

## 2016-04-07 NOTE — Care Management Note (Addendum)
Case Management Note  Patient Details  Name: Ernest PineRobin C Berzins MRN: [id number] Date of Birth: 08-01-1985  Subjective/Objective:         CM following for progression and d/c planning.            Action/Plan: 04/07/2016 Noted order for HHPT however [place] Medicaid will not cover HHPT unless the pt has had a CVA or joint replacement. MD notified. Pt mother states that she has taken the pt to [facility] in [place] and would like to take her there again. This CM offered to request an order for this outpatient PT from the pt attending. Will notify MDand ask for a prescription for this pt.  If unable to do a prescription then please enter order for outpt PT with specific instructions re areas of work to be done such as "Upper extremity strengthening" etc.  Pt has HH aide services provide by [place] [employer] , pt Mother will call for resumption of these services. We are unable to start or resume.   Expected Discharge Date:                  Expected Discharge Plan:  Home w Home Health Services  In-House Referral:  NA  Discharge planning Services  CM Consult  Post Acute Care Choice:    Choice offered to:  Parent  DME Arranged:    DME Agency:     HH Arranged:    HH Agency:     Status of Service:  In process, will continue to follow  If discussed at Long Length of Stay Meetings, dates discussed:    Additional Comments:  Starlyn SkeansRoyal, Cordel Drewes U, RN 04/07/2016, 2:03 PM

## 2016-04-08 LAB — BASIC METABOLIC PANEL
Anion gap: 5 (ref 5–15)
BUN: 8 mg/dL (ref 6–20)
CALCIUM: 8.9 mg/dL (ref 8.9–10.3)
CO2: 28 mmol/L (ref 22–32)
CREATININE: 0.89 mg/dL (ref 0.44–1.00)
Chloride: 111 mmol/L (ref 101–111)
Glucose, Bld: 90 mg/dL (ref 65–99)
Potassium: 3 mmol/L — ABNORMAL LOW (ref 3.5–5.1)
SODIUM: 144 mmol/L (ref 135–145)

## 2016-04-08 LAB — CBC
HCT: 32.5 % — ABNORMAL LOW (ref 36.0–46.0)
Hemoglobin: 9.8 g/dL — ABNORMAL LOW (ref 12.0–15.0)
MCH: 33.9 pg (ref 26.0–34.0)
MCHC: 30.2 g/dL (ref 30.0–36.0)
MCV: 112.5 fL — ABNORMAL HIGH (ref 78.0–100.0)
PLATELETS: 169 10*3/uL (ref 150–400)
RBC: 2.89 MIL/uL — ABNORMAL LOW (ref 3.87–5.11)
RDW: 18.2 % — ABNORMAL HIGH (ref 11.5–15.5)
WBC: 13.3 10*3/uL — ABNORMAL HIGH (ref 4.0–10.5)

## 2016-04-08 LAB — PROCALCITONIN: PROCALCITONIN: 0.22 ng/mL

## 2016-04-08 MED ORDER — IPRATROPIUM-ALBUTEROL 0.5-2.5 (3) MG/3ML IN SOLN: 3.0000 mL | Freq: Four times a day (QID) | RESPIRATORY_TRACT | Status: DC | PRN

## 2016-04-08 MED ORDER — POTASSIUM CHLORIDE CRYS ER 20 MEQ PO TBCR
40.0000 meq | EXTENDED_RELEASE_TABLET | Freq: Two times a day (BID) | ORAL | Status: AC
  Administered 2016-04-08 (×2): 40 meq via ORAL
  Filled 2016-04-08 (×2): qty 2

## 2016-04-08 MED ORDER — AMOXICILLIN-POT CLAVULANATE 875-125 MG PO TABS
1.0000 | ORAL_TABLET | Freq: Two times a day (BID) | ORAL | Status: DC
  Administered 2016-04-08 – 2016-04-11 (×7): 1 via ORAL
  Filled 2016-04-08 (×7): qty 1

## 2016-04-08 NOTE — Progress Notes (Signed)
Family Medicine Teaching Service Daily Progress Note Intern Pager: [phone]  [name] Medical record number: [id number] Date of birth: [birth year] Age: 30 y.o. Gender: female  Primary Care Provider: Delynn FlavinAshly Gottschalk, DO Consultants: none Code Status: Full  Assessment and Plan: Michele PineRobin C Sandate is a 30 y.o. female presenting with hypoxia . PMH is significant for infantile cerebral palsy, seizure disorder, profound intellectual disability  # CAP. On 2L [place] (no prior oxygen requirement). Over 48 hours since last fever.  -- Azithromycin/CTX (10/17-10/18) -- Vanc/zosyn (10/19-10/20) -- Augmentin (10/21- ) --Tylenol 650 mg q6 prn --Zofran 4 mg q6   # Infantile cerebral palsy/intellectual disability: completely dependent upon mother for ADLs. - OOB with assistance only  # Seizure disorder: on Depakote 1000mg  BID. Keppra 1200mg  BID. Topamax 150mg  BID, Carnitor 0.325mh TID, Banzel susp 5mg  BID. Diastat prn rectal. - Continue home medications - Mother has Banzel with her and can use during hospitalization per pharmacy.  # GERD: on Protonix 10cc daily - Continue Protonix   FEN: Regular diet, SLIV Ppx:  Disposition: Admitted pending above management. Anticipate discharge home if no fevers or oral antibiotics and able to be weaned to room air.   Subjective:  Patient improving per mother. Has been more interactive and eating more. Afebrile for 48 hours.   Objective: Temp:  [98.1 F (36.7 C)-98.8 F (37.1 C)] 98.1 F (36.7 C) (10/21 0544) Pulse Rate:  [85-101] 94 (10/21 0544) Resp:  [18-19] 18 (10/21 0544) BP: (93-122)/(38-61) 101/38 (10/21 0544) SpO2:  [94 %-99 %] 94 % (10/21 0544)   Physical Exam:  General: Awake and alert. NAD. Lying in bed.  Cardiovascular: RRR, no murmurs Respiratory: diffuse rhonchi, expiratory wheeze most prominent at the bases, on 2L [place], normal WOB Gastrointestinal: obese, soft, NT/ND, +BS MSK: Hands mildly edematous. No cyanosis Derm:  Warm, dry Neuro: Awake and alert. Does not follow commands.   Laboratory:  Recent Labs Lab 04/06/16 0813 04/07/16 0504 04/08/16 0450  WBC 13.4* 12.8* 13.3*  HGB 9.0* 9.0* 9.8*  HCT 29.8* 29.7* 32.5*  PLT 165 164 169    Recent Labs Lab 04/05/16 0050 04/05/16 0718 04/06/16 0813 04/07/16 0504 04/08/16 0450  NA 143 142 144 144 144  K 4.6 3.7 3.3* 3.2* 3.0*  CL 111 111 113* 113* 111  CO2 27 25 24 24 28   BUN 21* 15 14 12 8   CREATININE 0.67 0.72 0.88 0.84 0.89  CALCIUM 8.8* 8.3* 7.9* 7.8* 8.9  PROT 7.0 5.9*  --   --   --   BILITOT 1.0 0.3  --   --   --   ALKPHOS 75 65  --   --   --   ALT 19 17  --   --   --   AST 41 27  --   --   --   GLUCOSE 95 98 81 64* 90    Imaging/Diagnostic Tests: No results found.   Ardith Darkaleb M Ouita Nish, MD 04/08/2016, 8:11 AM PGY-3, [place] [facility] FPTS Intern pager: [phone], text pages welcome

## 2016-04-08 NOTE — Discharge Summary (Signed)
Family Medicine Teaching [facility] Discharge Summary  Patient name: Michele [name] Medical record number: [id number] Date of birth: 07-19-1985 Age: 30 y.o. Gender: female Date of Admission: 04/04/2016  Date of Discharge: 04/11/2016 Admitting Physician: Carney Living, MD  Primary Care Provider: Delynn Flavin, DO Consultants: none  Indication for Hospitalization: Dyspnea secondary to CAP  Discharge Diagnoses/Problem List:  Pneumonia Hypernatremia Cerebral palsy Seizure GERD  Disposition: Home with HHPT  Discharge Condition: Stable  Discharge Exam:  General: sleeping, wakes to stimuli more interactive and awake, mother at bedside Eyes: sclera white ENTM: MMM, dried rhinorrhea present Neck: supple Cardiovascular: RRR, no murmurs Respiratory: diffuse rhonchi, expiratory wheeze most prominent at the bases, on 2L [place], normal WOB Gastrointestinal: obese, soft, NT/ND, +BS MSK: Left calf slightly larger than right, no palpable cords, no increased warmth, seemingly nontender but difficult to assess Derm: dry, in tact, no rashes Neuro: wakes to gentle stimuli but does not follow commands (patient has difficulty following commands at baseline without significant direction), does not have normal speech at baseline   Brief Hospital Course:  Michele Kissoon Fordis a 30 y.o.femalepresenting with hypoxia. PMH is significant for infantile cerebral palsy, seizure disorder, profound intellectual disability  # Hypoxia in the setting of CAP most likely secondary to aspiration,  Resolved Patient admitted with hypoxia and new oxygen requirement to 2L [place]. CXR was suspicious for pneumonia with right basilar airspace opacity. PE was rule out with a negative CTA. Patient was started on Cefepime and Azithromycin. However, patient continued to worsen clinically. Repeat CXR showed dense consolidation in left lower lobe consistent with pneumonia. With broadening of antibiotic coverage to Vancomycin  and Zosyn covering anaerobe, patient continued to improve. Patient was slowly weaned off [place] and returned to baseline respiratory status Patient vitals have returned to baseline after antibiotic coverage was transitioned from vancomycin, zosyn to Augmentin po on 10/21. Blood cultures continued to show no growth. --Complete course of augmentin 875 mg bid. Start 10/21 DAY1-End Date 10/30  #Hypernatremia, resolving Patient's sodium has been gradually increasing throughout her stay, and was 153 on 10/23. Patient was started on D5 1/4NS and was later transition to D5 1/2NS. Hypernatremia was slowly corrected over 48 hours to147. Patient was seen by dietician and given advice about free water intake post discharge and appropriate diet in addition to speech recommendation for regular diet and thin liquids.  # Infantile cerebral palsy/intellectual disability: Patient iscompletely dependent upon mother for ADLs. -- Patient will have Home health PT post discharge to help with deconditioning given recent hospitalization for pneumonia.  # Seizure disorder, in the setting of cerebral palsy Patient will continue home regimen on discharge currently on Depakote 1000mg  BID. Keppra 1200mg  BID. Topamax 150mg  BID, Carnitor 0.4mh TID, Banzel susp 5mg  BID. Diastat prn rectal.  # GERD: Patient will continue home regimen --Continue Protonix   Issues for Follow Up:  1. Please place order for outpatient PT, patient's mum would like to take her to [facility] in [place]. Please write specific instructions needed for patient areas of work i.e. Upper Extremitiy strengthening etc. PLEASE REFER to CM note on 10/20 2. Complete 10 day course of Augmentin course. Start date: 10/21- End Date:10/30  Significant Procedures: None  Significant Labs and Imaging:   Recent Labs Lab 04/08/16 0450 04/09/16 0517 04/10/16 0524  WBC 13.3* 10.2 9.5  HGB 9.8* 9.5* 10.1*  HCT 32.5* 31.3* 33.4*  PLT 169 166 153    Recent Labs Lab  04/05/16 0050 04/05/16 0718  04/07/16  1057  04/10/16 0524 04/10/16 1502 04/10/16 2252 04/11/16 0857 04/11/16 1526  NA 143 142  < >  --   < > 152* 153* 148* 148* 147*  K 4.6 3.7  < >  --   < > 3.2* 3.2* 3.1* 2.8* 3.1*  CL 111 111  < >  --   < > 116* 116* 114* 111 112*  CO2 27 25  < >  --   < > 30 32 29 30 28   GLUCOSE 95 98  < >  --   < > 87 89 105* 99 88  BUN 21* 15  < >  --   < > 12 11 9 9 6   CREATININE 0.67 0.72  < >  --   < > 0.89 0.87 0.84 0.85 0.80  CALCIUM 8.8* 8.3*  < >  --   < > 9.1 8.9 8.7* 8.5* 8.2*  MG  --   --   --  1.9  --   --   --   --  2.2  --   ALKPHOS 75 65  --   --   --   --   --   --   --   --   AST 41 27  --   --   --   --   --   --   --   --   ALT 19 17  --   --   --   --   --   --   --   --   ALBUMIN 3.1* 2.6*  --   --   --   --   --   --   --   --   < > = values in this interval not displayed.  Dg Chest 2 View  Result Date: 04/05/2016 CLINICAL DATA:  Acute onset of cough and concern for generalized chest pain. Initial encounter. EXAM: CHEST  2 VIEW COMPARISON:  Chest radiograph performed 04/03/2016 FINDINGS: The lungs are hypoexpanded. Mild right basilar opacity may reflect pneumonia. Vascular crowding is noted. No pneumothorax or significant pleural effusion is seen. The cardiomediastinal silhouette is borderline normal in size. No acute osseous abnormalities are seen. IMPRESSION: Lungs hypoexpanded. Mild right basilar airspace opacity may reflect pneumonia. Electronically Signed   By: Roanna Raider M.D.   On: 04/05/2016 01:23   Ct Angio Chest Pe W Or Wo Contrast  Result Date: 04/05/2016 CLINICAL DATA:  30 year old female with hypoxia and right cough. EXAM: CT ANGIOGRAPHY CHEST WITH CONTRAST TECHNIQUE: Multidetector CT imaging of the chest was performed using the standard protocol during bolus administration of intravenous contrast. Multiplanar CT image reconstructions and MIPs were obtained to evaluate the vascular anatomy. CONTRAST:  100 cc Isovue 370  COMPARISON:  Chest radiograph dated 04/05/2016 FINDINGS: Evaluation is limited due to streak artifact caused by patient's arms and respiratory motion artifact. Cardiovascular: The thoracic aorta appears unremarkable. The origins of the great vessels of the aortic arch appear patent. There is no CT evidence of pulmonary embolism. Mild cardiomegaly. No pericardial effusion. Mediastinum/Nodes: There is no hilar or mediastinal adenopathy. There is a small hiatal hernia. The esophagus is grossly unremarkable. Lungs/Pleura: There is consolidative changes of the posterior segment of the left upper lobe. Linear and streaky densities with patchy areas of consolidative changes in the lower lobes bilaterally most compatible with multifocal pneumonia. Underlying mass is not excluded. Clinical correlation and follow-up resolution recommended. There is no pleural effusion or pneumothorax. The central airways  are patent. Upper Abdomen: Multiple enhancing lesions throughout the liver which the largest lesion measuring approximately 15 x 15 mm in the right lobe and 16 x 20 mm in the left lobe. These lesions are indeterminate and although may represent hemangioma, metastatic disease is not excluded. MRI without and with contrast is recommended for further characterization. The remainder of the visualized upper abdomen is unremarkable. Musculoskeletal: There is no axillary adenopathy. The chest wall soft tissues appear unremarkable. There is diffuse sclerotic appearance of the bones may be related to underlying hematological or metabolic disease or represent renal osteodystrophy. Clinical correlation is recommended. No acute fracture. Review of the MIP images confirms the above findings. IMPRESSION: No CT evidence of pulmonary embolism. Consolidative changes involving the left upper lobe and lower lobes bilaterally most compatible with multifocal pneumonia. Clinical correlation and follow-up to resolution recommended. **An incidental  finding of potential clinical significance has been found. Multiple enhancing hepatic lesions, incompletely characterized may represent hemangioma versus metastatic disease. MRI without and with contrast is recommended for further evaluation. Diffuse osseous sclerosis may be related to underlying hematological or metabolic disease or represent renal osteodystrophy. Clinical correlation is recommended. No acute fracture. Electronically Signed   By: Elgie CollardArash  Radparvar M.D.   On: 04/05/2016 06:45    Results/Tests Pending at Time of Discharge: None  Discharge Medications:    Medication List    TAKE these medications   alendronate 70 MG tablet Commonly known as:  FOSAMAX TAKE 1 TABLET BY MOUTH EVERY 7 DAYS ON SATURDAY WITH A FULL GLASS OF WATER ON AN EMPTY STOMACH   amoxicillin-clavulanate 875-125 MG tablet Commonly known as:  AUGMENTIN Take 1 tablet by mouth every 12 (twelve) hours.   BANZEL 40 MG/ML Susp Generic drug:  Rufinamide TAKE 5 MLS BY MOUTH IN THE MORNING AND 5 MLS BY MOUTH AT NIGHT   CALCARB 600/D 600-400 MG-UNIT tablet Generic drug:  Calcium Carbonate-Vitamin D Take 1 tablet by mouth 2 (two) times daily. 1 tab per tube 2 time a day   CARNITOR 1 GM/10ML solution Generic drug:  levOCARNitine TAKE 5 MLS BY MOUTH THREE TIMES DAILY   clotrimazole 1 % cream Commonly known as:  LOTRIMIN APPLY EXTERNALLY TO THE AFFECTED AREA TWICE DAILY   DEPAKOTE SPRINKLES 125 MG capsule Generic drug:  divalproex TAKE 8 CAPSULES BY MOUTH TWICE DAILY   DIASTAT ACUDIAL 20 MG Gel Generic drug:  diazepam DIAL TO 15MG , GIVE RECTALLY FOR SEIZURES LASTING 2 MINUTES OR LONGER   feeding supplement (ENSURE ENLIVE) Liqd Take 237 mLs by mouth 3 (three) times daily between meals.   fluticasone 50 MCG/ACT nasal spray Commonly known as:  FLONASE SHAKE LIQUID AND USE 2 SPRAYS IN EACH NOSTRIL DAILY   folic acid 1 MG tablet Commonly known as:  FOLVITE Take 1 mg by mouth every morning. 1 tab per tube  daily   furosemide 20 MG tablet Commonly known as:  LASIX TAKE 1 TABLET(20 MG) BY MOUTH DAILY AS NEEDED What changed:  additional instructions   guaiFENesin 600 MG 12 hr tablet Commonly known as:  MUCINEX Take 1 tablet (600 mg total) by mouth 2 (two) times daily.   hydrocortisone cream 0.5 % Apply 1 application topically 2 (two) times daily.   KEPPRA 100 MG/ML solution Generic drug:  levETIRAcetam TAKE 12 MLS BY MOUTH EVERY MORNING AND 12 MLS EVERY EVENING   lactulose 10 GM/15ML solution Commonly known as:  CHRONULAC TAKE 30 ML BY MOUTH TWICE DAILY   nystatin powder Generic drug:  nystatin APPLY TO AFFECTED DIAPER AREAS WITH EVERY DIAPER CHANGE UNTIL RASH CLEARS   ondansetron 4 MG/5ML solution Commonly known as:  ZOFRAN TAKE 5 MLS FOR PAIN EVERY 8 HOURS AS NEEDED FOR NAUSEA OR VOMITING   pantoprazole sodium 40 mg/20 mL Pack Commonly known as:  PROTONIX Place 10 mLs (20 mg total) into feeding tube daily.   TOPAMAX SPRINKLE 25 MG capsule Generic drug:  topiramate TAKE 6 CAPSULES BY MOUTH IN THE MORNING AND 6 CAPSULES BY MOUTH IN THE EVENING WITH FOOD       Discharge Instructions: Please refer to Patient Instructions section of EMR for full details.  Patient was counseled important signs and symptoms that should prompt return to medical care, changes in medications, dietary instructions, activity restrictions, and follow up appointments.   Follow-Up Appointments: Follow-up Information    Lovena Neighbours, MD. Go on 04/18/2016.   Specialty:  Family Medicine Why:  2:00pm hospital follow up Contact information: [address] [place] [address] [phone]        [facility]. Go on 04/13/2016.   Why:  9:30 am (Lab appt) Contact information: [address] [place] [address] [phone]          Lovena Neighbours, MD 04/11/2016, 10:36 PM PGY-1, [facility]

## 2016-04-09 LAB — CBC
HEMATOCRIT: 31.3 % — AB (ref 36.0–46.0)
HEMOGLOBIN: 9.5 g/dL — AB (ref 12.0–15.0)
MCH: 34.1 pg — ABNORMAL HIGH (ref 26.0–34.0)
MCHC: 30.4 g/dL (ref 30.0–36.0)
MCV: 112.2 fL — AB (ref 78.0–100.0)
Platelets: 166 10*3/uL (ref 150–400)
RBC: 2.79 MIL/uL — ABNORMAL LOW (ref 3.87–5.11)
RDW: 18.3 % — AB (ref 11.5–15.5)
WBC: 10.2 10*3/uL (ref 4.0–10.5)

## 2016-04-09 LAB — BASIC METABOLIC PANEL
ANION GAP: 6 (ref 5–15)
BUN: 8 mg/dL (ref 6–20)
CALCIUM: 8.9 mg/dL (ref 8.9–10.3)
CHLORIDE: 112 mmol/L — AB (ref 101–111)
CO2: 30 mmol/L (ref 22–32)
Creatinine, Ser: 0.87 mg/dL (ref 0.44–1.00)
GFR calc non Af Amer: >60 mL/min (ref >60)
GLUCOSE: 74 mg/dL (ref 65–99)
Potassium: 3.2 mmol/L — ABNORMAL LOW (ref 3.5–5.1)
Sodium: 148 mmol/L — ABNORMAL HIGH (ref 135–145)

## 2016-04-09 MED ORDER — POTASSIUM CHLORIDE CRYS ER 20 MEQ PO TBCR
40.0000 meq | EXTENDED_RELEASE_TABLET | Freq: Two times a day (BID) | ORAL | Status: AC
  Administered 2016-04-09 (×2): 40 meq via ORAL
  Filled 2016-04-09 (×2): qty 2

## 2016-04-09 NOTE — Progress Notes (Signed)
Family Medicine Teaching Service Daily Progress Note Intern Pager: [phone]  Patient name: Michele [name] Medical record number: [id number] Date of birth: Jul 20, 1985 Age: 30 y.o. Gender: female  Primary Care Provider: Delynn Flavin, DO Consultants: none Code Status: Full  Assessment and Plan: Michele [name] is a 30 y.o. female presenting with hypoxia . PMH is significant for infantile cerebral palsy, seizure disorder, profound intellectual disability  # CAP, w/ possible aspiration pna. On room air during my exam but tachypneic, O2 checked and was 85% on RA.  Placed back on 2L [place] (no home oxygen requirement). Afebrile >48 hours. WBC down 10.2 this am. - Azithromycin/CTX (10/17-10/18) - Vanc/zosyn (10/19-10/20) - Augmentin (10/21- ).  Will plan for 6 more days of abx to complete a 10 day course from when Vanc/ Zosyn started. - Tylenol 650 mg q6 prn - Zofran 4 mg q6   # Hypokalemia: K 3.2 this am. - KDur x2  # Infantile cerebral palsy/intellectual disability: completely dependent upon mother for ADLs. - OOB with assistance only  # Seizure disorder: on Depakote 1000mg  BID. Keppra 1200mg  BID. Topamax 150mg  BID, Carnitor 0.12mh TID, Banzel susp 5mg  BID. Diastat prn rectal. - Continue home medications - Mother has Banzel with her and can use during hospitalization per pharmacy.  # GERD: on Protonix 10cc daily - Continue Protonix   FEN: Regular diet, SLIV Ppx: Lovenox sub-q  Disposition:  Anticipate discharge home once weaned to room air.  Subjective:  Mother notes that patient is eating ok.  She notes that she has been breathing funny since O2 turned off.  Objective: Temp:  [98.1 F (36.7 C)-99.1 F (37.3 C)] 98.5 F (36.9 C) (10/22 0511) Pulse Rate:  [91-96] 92 (10/22 0511) Resp:  [16-20] 20 (10/22 0511) BP: (95-126)/(50-99) 95/50 (10/22 0511) SpO2:  [94 %-98 %] 94 % (10/22 0511) Weight:  [197 lb 15.6 oz (89.8 kg)] 197 lb 15.6 oz (89.8 kg) (10/21 2109)   Physical  Exam:  General: Awake and alert. NAD. Lying in bed. Labored breathing. Cardiovascular: RRR, no murmurs Respiratory: good air movement, slight decreased breath sounds on Left, on room air, tachypneic, no wheeze or rhonchi Gastrointestinal: obese, soft, NT/ND, +BS MSK: No edema. No cyanosis Derm: Warm, dry Neuro: Awake and alert. Does not follow commands.   Laboratory:  Recent Labs Lab 04/07/16 0504 04/08/16 0450 04/09/16 0517  WBC 12.8* 13.3* 10.2  HGB 9.0* 9.8* 9.5*  HCT 29.7* 32.5* 31.3*  PLT 164 169 166    Recent Labs Lab 04/05/16 0050 04/05/16 0718  04/07/16 0504 04/08/16 0450 04/09/16 0517  NA 143 142  < > 144 144 148*  K 4.6 3.7  < > 3.2* 3.0* 3.2*  CL 111 111  < > 113* 111 112*  CO2 27 25  < > 24 28 30   BUN 21* 15  < > 12 8 8   CREATININE 0.67 0.72  < > 0.84 0.89 0.87  CALCIUM 8.8* 8.3*  < > 7.8* 8.9 8.9  PROT 7.0 5.9*  --   --   --   --   BILITOT 1.0 0.3  --   --   --   --   ALKPHOS 75 65  --   --   --   --   ALT 19 17  --   --   --   --   AST 41 27  --   --   --   --   GLUCOSE 95 98  < > 64*  90 74  < > = values in this interval not displayed.  Imaging/Diagnostic Tests: No results found.   Raliegh IpAshly M Gottschalk, DO 04/09/2016, 8:34 AM PGY-3, [facility] FPTS Intern pager: [phone], text pages welcome

## 2016-04-10 LAB — BASIC METABOLIC PANEL
ANION GAP: 6 (ref 5–15)
ANION GAP: 5 (ref 5–15)
ANION GAP: 5 (ref 5–15)
BUN: 12 mg/dL (ref 6–20)
BUN: 11 mg/dL (ref 6–20)
BUN: 9 mg/dL (ref 6–20)
CALCIUM: 9.1 mg/dL (ref 8.9–10.3)
CALCIUM: 8.9 mg/dL (ref 8.9–10.3)
CALCIUM: 8.7 mg/dL — AB (ref 8.9–10.3)
CO2: 30 mmol/L (ref 22–32)
CO2: 32 mmol/L (ref 22–32)
CO2: 29 mmol/L (ref 22–32)
Chloride: 116 mmol/L — ABNORMAL HIGH (ref 101–111)
Chloride: 116 mmol/L — ABNORMAL HIGH (ref 101–111)
Chloride: 114 mmol/L — ABNORMAL HIGH (ref 101–111)
Creatinine, Ser: 0.89 mg/dL (ref 0.44–1.00)
Creatinine, Ser: 0.87 mg/dL (ref 0.44–1.00)
Creatinine, Ser: 0.84 mg/dL (ref 0.44–1.00)
Glucose, Bld: 87 mg/dL (ref 65–99)
Glucose, Bld: 89 mg/dL (ref 65–99)
Glucose, Bld: 105 mg/dL — ABNORMAL HIGH (ref 65–99)
POTASSIUM: 3.2 mmol/L — AB (ref 3.5–5.1)
POTASSIUM: 3.2 mmol/L — AB (ref 3.5–5.1)
Potassium: 3.1 mmol/L — ABNORMAL LOW (ref 3.5–5.1)
Sodium: 152 mmol/L — ABNORMAL HIGH (ref 135–145)
Sodium: 153 mmol/L — ABNORMAL HIGH (ref 135–145)
Sodium: 148 mmol/L — ABNORMAL HIGH (ref 135–145)

## 2016-04-10 LAB — CBC
HEMATOCRIT: 33.4 % — AB (ref 36.0–46.0)
Hemoglobin: 10.1 g/dL — ABNORMAL LOW (ref 12.0–15.0)
MCH: 34.5 pg — ABNORMAL HIGH (ref 26.0–34.0)
MCHC: 30.2 g/dL (ref 30.0–36.0)
MCV: 114 fL — ABNORMAL HIGH (ref 78.0–100.0)
Platelets: 153 10*3/uL (ref 150–400)
RBC: 2.93 MIL/uL — AB (ref 3.87–5.11)
RDW: 18.5 % — AB (ref 11.5–15.5)
WBC: 9.5 10*3/uL (ref 4.0–10.5)

## 2016-04-10 LAB — PROCALCITONIN

## 2016-04-10 MED ORDER — DEXTROSE-NACL 5-0.2 % IV SOLN
INTRAVENOUS | Status: DC
  Administered 2016-04-10: 17:00:00 via INTRAVENOUS
  Filled 2016-04-10 (×2): qty 1000

## 2016-04-10 MED ORDER — AMOXICILLIN-POT CLAVULANATE 875-125 MG PO TABS: 1.0000 | ORAL_TABLET | Freq: Two times a day (BID) | ORAL | 0 refills | Status: AC

## 2016-04-10 MED ORDER — SODIUM CHLORIDE 0.9 % IV BOLUS (SEPSIS)
1000.0000 mL | Freq: Once | INTRAVENOUS | Status: AC
  Administered 2016-04-10: 1000 mL via INTRAVENOUS

## 2016-04-10 NOTE — Progress Notes (Signed)
Family Medicine Teaching Service Daily Progress Note Intern Pager: [phone]  Patient name: Michele [name] Medical record number: [id number] Date of birth: 07-17-1985 Age: 30 y.o. Gender: female  Primary Care Provider: Delynn Flavin, DO Consultants: none Code Status: Full  Assessment and Plan: Michele [name] is a 30 y.o. female presenting with hypoxia . PMH is significant for infantile cerebral palsy, seizure disorder, profound intellectual disability  # CAP, w/ possible aspiration pna. On room air during my exam but tachypneic, O2 checked and was 85% on RA.  Placed back on 2L [place] (no home oxygen requirement). Afebrile >48 hours. WBC down 10.2 this am. - Azithromycin/CTX (10/17-10/18) - Vanc/zosyn (10/19-10/20) - Augmentin (10/21- ).  Will plan for 5 more days of abx to complete a 10 day course from when Vanc/ Zosyn started. - Tylenol 650 mg q6 prn - Zofran 4 mg q6  -SLP seen --> rec nectar-thick diet  #Hypernatremia  This AM 152.  -s/p bolus  -IVFs D5 1/4 @100  cc/hr slowly to prevent cerebral edema -recheck BMET at 5 pm  # Hypokalemia: K 3.2 this am. Gave k-dur x 2 doses.  - recheck BMET  # Infantile cerebral palsy/intellectual disability: completely dependent upon mother for ADLs. - OOB with assistance only  # Seizure disorder: on Depakote 1000mg  BID. Keppra 1200mg  BID. Topamax 150mg  BID, Carnitor 0.61mh TID, Banzel susp 5mg  BID. Diastat prn rectal. - Continue home medications - Mother has Banzel with her and can use during hospitalization per pharmacy.  # GERD: on Protonix 10cc daily - Continue Protonix   FEN: Regular diet, SLIV Ppx: Lovenox sub-q  Disposition:  On RA. Anticipate home tomorrow pending electrolyte correction.    Subjective:  Mother notes that patient is eating ok.  Mother feeding breakfast this AM when seen.  No issues at current time.    Objective: Temp:  [97.6 F (36.4 C)-98.3 F (36.8 C)] 98.3 F (36.8 C) (10/23 0927) Pulse Rate:  [88-98] 90  (10/23 0927) Resp:  [18] 18 (10/23 0927) BP: (92-104)/(49-64) 104/52 (10/23 0927) SpO2:  [94 %-98 %] 98 % (10/23 0927) Weight:  [197 lb 5 oz (89.5 kg)] 197 lb 5 oz (89.5 kg) (10/22 2204)   Physical Exam:  General: Awake and alert. NAD. Lying in bed, NAD Cardiovascular: RRR, no murmurs Respiratory: good air movement, slight decreased breath sounds on Left, on RA, no increased WOB Gastrointestinal: obese, soft, NT/ND, +BS MSK: No edema. No cyanosis Derm: Warm, dry Neuro: Awake and alert. Does not follow commands.   Laboratory:  Recent Labs Lab 04/08/16 0450 04/09/16 0517 04/10/16 0524  WBC 13.3* 10.2 9.5  HGB 9.8* 9.5* 10.1*  HCT 32.5* 31.3* 33.4*  PLT 169 166 153    Recent Labs Lab 04/05/16 0050 04/05/16 0718  04/08/16 0450 04/09/16 0517 04/10/16 0524  NA 143 142  < > 144 148* 152*  K 4.6 3.7  < > 3.0* 3.2* 3.2*  CL 111 111  < > 111 112* 116*  CO2 27 25  < > 28 30 30   BUN 21* 15  < > 8 8 12   CREATININE 0.67 0.72  < > 0.89 0.87 0.89  CALCIUM 8.8* 8.3*  < > 8.9 8.9 9.1  PROT 7.0 5.9*  --   --   --   --   BILITOT 1.0 0.3  --   --   --   --   ALKPHOS 75 65  --   --   --   --   ALT 19  17  --   --   --   --   AST 41 27  --   --   --   --   GLUCOSE 95 98  < > 90 74 87  < > = values in this interval not displayed.  Imaging/Diagnostic Tests:  No results found.  Freddrick MarchYashika Crescencio Jozwiak, MD 04/10/2016, 2:08 PM PGY-1, [facility] FPTS Intern pager: [phone], text pages welcome

## 2016-04-10 NOTE — Progress Notes (Signed)
Physical Therapy Treatment Patient Details Name: Michele [name] MRN: [id number] DOB: 1986/03/18 Today's Date: 04/10/2016    History of Present Illness Michele [name] is a 30 y.o. female presenting with hypoxia and found to have PNA. PMH is significant for infantile cerebral palsy, seizure disorder, profound intellectual disability    PT Comments    More awake and alert today; Requiring less assist today for OOB transfers; Mother present and tells me that she is confident in their ability to manage at this functional level; I agree with dc home -- home with familiar environment, routine, and caregivers is the most therapeutic place for her; Entire session conducted on Room Air and O2 sats greater than or equal to 93%  Follow Up Recommendations  Home health PT     Equipment Recommendations  None recommended by PT    Recommendations for Other Services       Precautions / Restrictions Precautions Precautions: Fall    Mobility  Bed Mobility Overal bed mobility: Needs Assistance;+2 for physical assistance Bed Mobility: Supine to Sit Rolling: Max assist   Supine to sit: Max assist     General bed mobility comments: Max assist and use of bed pad to move closer to EOB in prep for getting up; max assist to elevate trunk to sit  Transfers Overall transfer level: Needs assistance Equipment used: 2 person hand held assist Transfers: Sit to/from [employer] Sit to Stand: Max assist;+2 physical assistance Stand pivot transfers: +2 physical assistance;Mod assist       General transfer comment: Initial standing trial with max assist -- could be related to pt not understanding the task at the time; required only mod assist with transfer to the chair  Ambulation/Gait                 Stairs            Wheelchair Mobility    Modified Rankin (Stroke Patients Only)       Balance     Sitting balance-Leahy Scale: Fair (approaching Good) Sitting balance  - Comments: Pt able to sit EOB with min/guard with B UE and feet off of the floor due to short height     Standing balance-Leahy Scale: Poor                      Cognition Arousal/Alertness: Awake/alert Behavior During Therapy: WFL for tasks assessed/performed Overall Cognitive Status: History of cognitive impairments - at baseline                      Exercises      General Comments General comments (skin integrity, edema, etc.): Session conducted on Room Air and O2 sats remained greater than or equal to 93%      Pertinent Vitals/Pain Pain Assessment: Faces Faces Pain Scale: No hurt    Home Living                      Prior Function            PT Goals (current goals can now be found in the care plan section) Acute Rehab PT Goals Patient Stated Goal: Mother's goal is for pt to return home PT Goal Formulation: With family Time For Goal Achievement: 04/13/16 Potential to Achieve Goals: Good Progress towards PT goals: Progressing toward goals    Frequency    Min 3X/week      PT Plan Current plan remains appropriate  Co-evaluation             End of Session Equipment Utilized During Treatment: Gait belt Activity Tolerance: Patient tolerated treatment well Patient left: in chair;with call bell/phone within reach;with family/visitor present     Time: [id number] PT Time Calculation (min) (ACUTE ONLY): 19 min  Charges:  $Therapeutic Activity: 8-22 mins                    G Codes:      Van ClinesGarrigan, Yosef Krogh Hamff 04/10/2016, 11:18 AM  Van ClinesHolly Tyreik Delahoussaye, PT  Acute Rehabilitation Services Pager [phone] Office [phone]

## 2016-04-11 DIAGNOSIS — E870 Hyperosmolality and hypernatremia: Secondary | ICD-10-CM

## 2016-04-11 DIAGNOSIS — E876 Hypokalemia: Secondary | ICD-10-CM

## 2016-04-11 LAB — BASIC METABOLIC PANEL
ANION GAP: 7 (ref 5–15)
ANION GAP: 7 (ref 5–15)
BUN: 9 mg/dL (ref 6–20)
BUN: 6 mg/dL (ref 6–20)
CALCIUM: 8.5 mg/dL — AB (ref 8.9–10.3)
CHLORIDE: 112 mmol/L — AB (ref 101–111)
CO2: 30 mmol/L (ref 22–32)
CO2: 28 mmol/L (ref 22–32)
CREATININE: 0.8 mg/dL (ref 0.44–1.00)
Calcium: 8.2 mg/dL — ABNORMAL LOW (ref 8.9–10.3)
Chloride: 111 mmol/L (ref 101–111)
Creatinine, Ser: 0.85 mg/dL (ref 0.44–1.00)
GFR calc Af Amer: >60 mL/min (ref >60)
GFR calc non Af Amer: >60 mL/min (ref >60)
GFR calc non Af Amer: >60 mL/min (ref >60)
GLUCOSE: 99 mg/dL (ref 65–99)
Glucose, Bld: 88 mg/dL (ref 65–99)
Potassium: 2.8 mmol/L — ABNORMAL LOW (ref 3.5–5.1)
Potassium: 3.1 mmol/L — ABNORMAL LOW (ref 3.5–5.1)
Sodium: 148 mmol/L — ABNORMAL HIGH (ref 135–145)
Sodium: 147 mmol/L — ABNORMAL HIGH (ref 135–145)

## 2016-04-11 LAB — CULTURE, BLOOD (ROUTINE X 2)
Culture: NO GROWTH
Culture: NO GROWTH

## 2016-04-11 LAB — MAGNESIUM: Magnesium: 2.2 mg/dL (ref 1.7–2.4)

## 2016-04-11 MED ORDER — ENSURE ENLIVE PO LIQD: 237.0000 mL | Freq: Three times a day (TID) | ORAL | 12 refills | Status: DC

## 2016-04-11 MED ORDER — DEXTROSE-NACL 5-0.45 % IV SOLN: INTRAVENOUS | Status: DC

## 2016-04-11 MED ORDER — POTASSIUM CHLORIDE 10 MEQ/100ML IV SOLN
10.0000 meq | INTRAVENOUS | Status: AC
  Administered 2016-04-11 (×6): 10 meq via INTRAVENOUS
  Filled 2016-04-11 (×5): qty 100

## 2016-04-11 MED ORDER — DEXTROSE-NACL 5-0.45 % IV SOLN
INTRAVENOUS | Status: DC
  Administered 2016-04-11: 07:00:00 via INTRAVENOUS

## 2016-04-11 MED ORDER — ENSURE ENLIVE PO LIQD
237.0000 mL | Freq: Three times a day (TID) | ORAL | Status: DC
  Administered 2016-04-11: 237 mL via ORAL

## 2016-04-11 MED ORDER — FLUCONAZOLE 100 MG PO TABS
150.0000 mg | ORAL_TABLET | Freq: Once | ORAL | Status: AC
  Administered 2016-04-11: 150 mg via ORAL
  Filled 2016-04-11: qty 2

## 2016-04-11 NOTE — Progress Notes (Signed)
Initial Nutrition Assessment  DOCUMENTATION CODES:   Obesity unspecified  INTERVENTION:  Provide Ensure Enlive po TID, each supplement provides 350 kcal and 20 grams of protein.  Encourage adequate PO intake ==> see fluid recommendations under estimated nutrition needs below.  NUTRITION DIAGNOSIS:   Inadequate oral intake related to poor appetite, acute illness as evidenced by meal completion < 50%.  GOAL:   Patient will meet greater than or equal to 90% of their needs  MONITOR:   PO intake, Supplement acceptance, Labs, Weight trends, Skin, I & O's  REASON FOR ASSESSMENT:   Consult Assessment of nutrition requirement/status  ASSESSMENT:   30 y.o. female presenting with hypoxia . PMH is significant for infantile cerebral palsy, seizure disorder, profound intellectual disability. Pt with CAP, w/ possible aspiration pna  Meal completion since admission has been 10-50% with 10% this AM. Family member at bedside reports pt was eating well PTA with usual consumption of 3+ meals a day with Ensure 3-4 times daily. Pt with no weight loss since admission. Hypernatremia and low potassium likely related to poor po intake. RD to order Ensure to aid in caloric, protein, and fluid needs. Noted family member reports she has been sipping on water, however amount usually consumed unknown. Possible plans for discharge today.   Unable to complete Nutrition-Focused physical exam at this time as pt with cerebral palsy.   Labs: Sodium at 148 (IV fluids running). Potassium at 2.8 (being repleted).  Diet Order:  Diet regular Room service appropriate? Yes; Fluid consistency: Thin  Skin:  Reviewed, no issues  Last BM:  10/21  Height:   Ht Readings from Last 1 Encounters:  04/06/16 4\' 11"  (1.499 m)    Weight:   Wt Readings from Last 1 Encounters:  04/09/16 197 lb 5 oz (89.5 kg)    Ideal Body Weight:   (N/A)  BMI:  Body mass index is 39.85 kg/m.  Estimated Nutritional Needs:   Kcal:   1600-1800  Protein:  75-90 grams  Fluid:  1.6 - 1.8 L/day once sodium corrected  EDUCATION NEEDS:   No education needs identified at this time  Roslyn SmilingStephanie Kellsie Grindle, MS, RD, LDN Pager # [phone] After hours/ weekend pager # [phone]

## 2016-04-11 NOTE — Discharge Instructions (Signed)
You were admitted for pneumonia.  A CT scan was performed to evaluate for clot in the lungs and this was negative.  The CT scan did reveal a pneumonia that is affecting both lungs.  For this reason, Michele [name] is being discharged with antibiotics to last for the next week.  You have follow up with Dr Sydnee Cabaliallo scheduled for next week.    Community-Acquired Pneumonia, Adult Pneumonia is an infection of the lungs. One type of pneumonia can happen while a person is in a hospital. A different type can happen when a person is not in a hospital (community-acquired pneumonia). It is easy for this kind to spread from person to person. It can spread to you if you breathe near an infected person who coughs or sneezes. Some symptoms include:  A dry cough.  A wet (productive) cough.  Fever.  Sweating.  Chest pain. HOME CARE  Take over-the-counter and prescription medicines only as told by your doctor.  Only take cough medicine if you are losing sleep.  If you were prescribed an antibiotic medicine, take it as told by your doctor. Do not stop taking the antibiotic even if you start to feel better.  Sleep with your head and neck raised (elevated). You can do this by putting a few pillows under your head, or you can sleep in a recliner.  Do not use tobacco products. These include cigarettes, chewing tobacco, and e-cigarettes. If you need help quitting, ask your doctor.  Drink enough water to keep your pee (urine) clear or pale yellow. A shot (vaccine) can help prevent pneumonia. Shots are often suggested for:  People older than 29 years of age.  People older than 29 years of age:  Who are having cancer treatment.  Who have long-term (chronic) lung disease.  Who have problems with their body's defense system (immune system). You may also prevent pneumonia if you take these actions:  Get the flu (influenza) shot every year.  Go to the dentist as often as told.  Wash your hands often. If soap and  water are not available, use hand sanitizer. GET HELP IF:  You have a fever.  You lose sleep because your cough medicine does not help. GET HELP RIGHT AWAY IF:  You are short of breath and it gets worse.  You have more chest pain.  Your sickness gets worse. This is very serious if:  You are an older adult.  Your body's defense system is weak.  You cough up blood.   This information is not intended to replace advice given to you by your health care provider. Make sure you discuss any questions you have with your health care provider.   Document Released: 11/22/2007 Document Revised: 02/24/2015 Document Reviewed: 09/30/2014 Elsevier Interactive Patient Education [employer]2016 [employer]vier Inc.

## 2016-04-11 NOTE — Progress Notes (Signed)
Transitions of Care Pharmacy Note  Plan:  -Per Family Medicine note, will treat with Augmentin 875 BID through 10/30 -No other changes noted in home meds  Educated on home meds with focus on augmentin and lactulose   Recommend monitoring for signs of diarrhea while on augmentin. Especially when combined with chronic lactulose, risk of diarrhea is increased. Patient's mom says frequency of bowel movements has not changed this admission so medication change recommended at this time. Continue to monitor as side effects of lactulose include hypernatremia and hypokalemia.  Michele [name] is an 30 y.o. female who presents with a chief complaint of hypoxia and later diagnosed with CAP. She is intellectually disabled and requires her mom for ADLs. In anticipation of discharge, pharmacy has reviewed this patient's prior to admission medication history, as well as [facility]  Current medication indications, dosing, frequency, and notable side effects reviewed with patient and family. family verbalized understanding of current inpatient medication regimen and is aware that the After Visit Summary when presented, will represent the most accurate medication list at discharge.   Assessment: Understanding of regimen: good Understanding of indications: good Potential of compliance: good Barriers to Obtaining Medications: No  Patient instructed to contact inpatient pharmacy team with further questions or concerns if needed.    Time spent preparing for discharge counseling: 15 Time spent counseling patient: [id number]   Thank you for allowing pharmacy to be a part of this patient's care.  Gwyndolyn KaufmanKai Keidra Withers Bernette Redbird(Kenny), PharmD  PGY1 Pharmacy Resident Pager: [phone] 04/11/2016 6:04 PM

## 2016-04-11 NOTE — Progress Notes (Addendum)
Speech Language Pathology Treatment: Dysphagia  Patient Details Name: Michele [name] MRN: [id number] DOB: 1986/05/05 Today's Date: 04/11/2016 Time: 0735-4301 SLP Time Calculation (min) (ACUTE ONLY): 15 min  Assessment / Plan / Recommendation Clinical Impression  Pt demonstrates significant improvement in alertness and timing of swallow. She is, sitting upright independently, cheerful and wanting to play. Ottis still needed encouragement to take liquids from her cup but with positive reinforcement SLP was able to observe pt with several sips of thin liquids via straw. No signs of aspiration observed, trigger of swallow much more timely. Pt also masticated potato chips (her favorite food) without difficulty. Mother reports pt continues to resist medications on some days. Suspect this pts dysphagia was a result of weakness from acute illness and is not present at baseline. Primary risk is while taking meds. Discussed signs of aspiration with mother and possible need to modify liquids when pt is sick or weak to reduce risk of aspiration. Education complete, pt may continue regular diet and thin liquids. No SLP f/u needed.    HPI HPI: Michele [name] a 30 y.o.femalepresenting with hypoxia. PMH is significant for infantile cerebral palsy, seizure disorder, profound intellectual disability. CXR with what appears to be R basilar pna, but CT shows Consolidative changes involving the left upper lobe and lower lobes      SLP Plan  All goals met     Recommendations  Diet recommendations: Regular;Thin liquid Liquids provided via: Straw Medication Administration: Crushed with puree Supervision: Trained caregiver to feed patient Compensations: Slow rate;Small sips/bites Postural Changes and/or Swallow Maneuvers: Seated upright 90 degrees;Upright 30-60 min after meal                Plan: All goals met       GO               Herbie Baltimore, MA CCC-SLP [phone]  Lynann Beaver 04/11/2016, 11:43 AM

## 2016-04-12 ENCOUNTER — Emergency Department (HOSPITAL_COMMUNITY): Payer: Medicaid Other

## 2016-04-12 ENCOUNTER — Other Ambulatory Visit: Payer: Medicaid Other

## 2016-04-12 ENCOUNTER — Encounter (HOSPITAL_COMMUNITY): Payer: Self-pay | Admitting: Emergency Medicine

## 2016-04-12 ENCOUNTER — Other Ambulatory Visit: Payer: Self-pay

## 2016-04-12 DIAGNOSIS — Z79899 Other long term (current) drug therapy: Secondary | ICD-10-CM

## 2016-04-12 DIAGNOSIS — G809 Cerebral palsy, unspecified: Secondary | ICD-10-CM | POA: Diagnosis not present

## 2016-04-12 DIAGNOSIS — J189 Pneumonia, unspecified organism: Secondary | ICD-10-CM | POA: Diagnosis not present

## 2016-04-12 DIAGNOSIS — E039 Hypothyroidism, unspecified: Secondary | ICD-10-CM | POA: Diagnosis not present

## 2016-04-12 DIAGNOSIS — R0602 Shortness of breath: Secondary | ICD-10-CM | POA: Diagnosis present

## 2016-04-12 LAB — COMPREHENSIVE METABOLIC PANEL
ALBUMIN: 2.5 g/dL — AB (ref 3.5–5.0)
ALT: 14 U/L (ref 14–54)
ANION GAP: 7 (ref 5–15)
AST: 24 U/L (ref 15–41)
Alkaline Phosphatase: 62 U/L (ref 38–126)
BUN: 6 mg/dL (ref 6–20)
CALCIUM: 9.1 mg/dL (ref 8.9–10.3)
CO2: 26 mmol/L (ref 22–32)
Chloride: 113 mmol/L — ABNORMAL HIGH (ref 101–111)
Creatinine, Ser: 0.87 mg/dL (ref 0.44–1.00)
GFR calc non Af Amer: >60 mL/min (ref >60)
GLUCOSE: 97 mg/dL (ref 65–99)
POTASSIUM: 3.3 mmol/L — AB (ref 3.5–5.1)
SODIUM: 146 mmol/L — AB (ref 135–145)
Total Bilirubin: 0.5 mg/dL (ref 0.3–1.2)
Total Protein: 6 g/dL — ABNORMAL LOW (ref 6.5–8.1)

## 2016-04-12 LAB — BASIC METABOLIC PANEL WITH GFR
BUN: 9 mg/dL (ref 7–25)
CO2: 27 mmol/L (ref 20–31)
Calcium: 8.3 mg/dL — ABNORMAL LOW (ref 8.6–10.2)
Chloride: 113 mmol/L — ABNORMAL HIGH (ref 98–110)
Creat: 0.77 mg/dL (ref 0.50–1.10)
GFR, Est Non African American: >89 mL/min (ref >=60)
GLUCOSE: 93 mg/dL (ref 65–99)
POTASSIUM: 3.3 mmol/L — AB (ref 3.5–5.3)
Sodium: 149 mmol/L — ABNORMAL HIGH (ref 135–146)

## 2016-04-12 NOTE — ED Triage Notes (Signed)
Pt.'s mother reported SOB with occasional dry cough and swelling at lower extremities onset today , she was admitted 04/04/2016 for pneumonia and discharged yesterday , no fever or chills.

## 2016-04-13 ENCOUNTER — Ambulatory Visit (HOSPITAL_COMMUNITY): Admission: RE | Admit: 2016-04-13 | Payer: Medicaid Other | Source: Ambulatory Visit

## 2016-04-13 ENCOUNTER — Other Ambulatory Visit: Payer: Medicaid Other

## 2016-04-13 ENCOUNTER — Emergency Department (HOSPITAL_COMMUNITY)
Admission: EM | Admit: 2016-04-13 | Discharge: 2016-04-13 | Disposition: A | Discharge status: Home / Self Care | Payer: Medicaid Other | Attending: Emergency Medicine | Admitting: Emergency Medicine

## 2016-04-13 DIAGNOSIS — J189 Pneumonia, unspecified organism: Secondary | ICD-10-CM

## 2016-04-13 DIAGNOSIS — J181 Lobar pneumonia, unspecified organism: Secondary | ICD-10-CM

## 2016-04-13 HISTORY — DX: Pneumonia, unspecified organism: J18.9

## 2016-04-13 LAB — CBC WITH DIFFERENTIAL/PLATELET
BASOS ABS: 0 10*3/uL (ref 0.0–0.1)
BASOS PCT: 0 %
EOS PCT: 1 %
Eosinophils Absolute: 0.1 10*3/uL (ref 0.0–0.7)
HCT: 35 % — ABNORMAL LOW (ref 36.0–46.0)
Hemoglobin: 10.5 g/dL — ABNORMAL LOW (ref 12.0–15.0)
LYMPHS ABS: 3.1 10*3/uL (ref 0.7–4.0)
Lymphocytes Relative: 26 %
MCH: 33.8 pg (ref 26.0–34.0)
MCHC: 30 g/dL (ref 30.0–36.0)
MCV: 112.5 fL — ABNORMAL HIGH (ref 78.0–100.0)
MONOS PCT: 9 %
Monocytes Absolute: 1.1 10*3/uL — ABNORMAL HIGH (ref 0.1–1.0)
NEUTROS ABS: 7.5 10*3/uL (ref 1.7–7.7)
Neutrophils Relative %: 64 %
PLATELETS: 135 10*3/uL — AB (ref 150–400)
RBC: 3.11 MIL/uL — ABNORMAL LOW (ref 3.87–5.11)
RDW: 18.3 % — AB (ref 11.5–15.5)
WBC: 11.8 10*3/uL — ABNORMAL HIGH (ref 4.0–10.5)

## 2016-04-13 NOTE — Discharge Instructions (Signed)
You may alternate between Tylenol 1000 mg every 6 hours as needed for fever and pain and ibuprofen 800 mg every 8 hours as needed for fever and pain. Her chest x-ray today showed that her pneumonia was improving. Please continue her antibiotics as prescribed. Please follow-up with her doctor as scheduled tomorrow. Please return in the morning for an ultrasound of her right leg to rule out a DVT.

## 2016-04-13 NOTE — ED Provider Notes (Signed)
By signing my name below, I, Emmanuella Mensah, attest that this documentation has been prepared under the direction and in the presence of [name] N Alexander Aument, DO. Electronically Signed: Angelene Giovanni, ED Scribe. 04/13/16. 3:09 AM.   TIME SEEN: 2:48 AM   CHIEF COMPLAINT:  Chief Complaint  Patient presents with  . Shortness of Breath    HPI: Level 5 Caveat due to pt being nonverbal  Michele [name] is a 30 y.o. female with a hx of pneumonia, cerebral palsy, and lennox-gastaut syndrome brought in by mother to the Emergency Department requesting evaluation for Concerns for shortness of breath onset yesterday. Mother reports associated intermittent wheezing and shaking of bilateral upper extremities. She adds that pt's shaking of hands is consistent with her symptoms when she is sick. Mother also reports that pt has bilateral leg swelling but denies a hx of CHF. No alleviating factors noted. Pt was admitted on 04/04/2016 after presenting to the ED in [facility] where she was diagnosed with community acquired pneumonia with a chest x-ray. She was then discharged on 04/11/16 with Amoxicillin. Had an ultrasound which ruled out ruling out a DVT of her right lower extremity on October 18 and advised to follow up with PCP on 04/13/2016. No other complaints at this time. Mother reports that the child had brief episodes were her oxygen saturation would drop into the 80s but then quickly come back up after 1-2 seconds. Does not wear oxygen at home. No vomiting or diarrhea.  PCP: Dr. Nadine Counts   ROS: Unable to obtain ROS due to pt being nonverbal.   PAST MEDICAL HISTORY/PAST SURGICAL HISTORY:  Past Medical History:  Diagnosis Date  . Cerebral palsy (HCC)   . Hypothyroidism   . Lennox-Gastaut syndrome (HCC)   . Osteoporosis   . Pneumonia   . Profound mental retardation   . Reflux   . Rickets   . Seizures (HCC)     MEDICATIONS:  Prior to Admission medications   Medication Sig Start Date End Date Taking?  Authorizing Provider  alendronate (FOSAMAX) 70 MG tablet TAKE 1 TABLET BY MOUTH EVERY 7 DAYS ON SATURDAY WITH A FULL GLASS OF WATER ON AN EMPTY STOMACH 01/12/16   Raliegh Ip, DO  amoxicillin-clavulanate (AUGMENTIN) 875-125 MG tablet Take 1 tablet by mouth every 12 (twelve) hours. 04/10/16 04/16/16  Ashly M Gottschalk, DO  BANZEL 40 MG/ML SUSP TAKE 5 MLS BY MOUTH IN THE MORNING AND 5 MLS BY MOUTH AT NIGHT 01/12/16   Elveria Rising, NP  Calcium Carbonate-Vitamin D (CALCARB 600/D) 600-400 MG-UNIT per tablet Take 1 tablet by mouth 2 (two) times daily. 1 tab per tube 2 time a day    Historical Provider, MD  CARNITOR 1 GM/10ML solution TAKE 5 MLS BY MOUTH THREE TIMES DAILY 01/12/16   Elveria Rising, NP  clotrimazole (LOTRIMIN) 1 % cream APPLY EXTERNALLY TO THE AFFECTED AREA TWICE DAILY 01/12/16   Ashly M Gottschalk, DO  DEPAKOTE SPRINKLES 125 MG capsule TAKE 8 CAPSULES BY MOUTH TWICE DAILY 02/07/16   Elveria Rising, NP  DIASTAT ACUDIAL 20 MG GEL DIAL TO 15MG , GIVE RECTALLY FOR SEIZURES LASTING 2 MINUTES OR LONGER 01/12/16   Elveria Rising, NP  feeding supplement, ENSURE ENLIVE, (ENSURE ENLIVE) LIQD Take 237 mLs by mouth 3 (three) times daily between meals. 04/11/16   Beaulah Dinning, MD  fluticasone (FLONASE) 50 MCG/ACT nasal spray SHAKE LIQUID AND USE 2 SPRAYS IN [facility] DAILY 01/12/16   Raliegh Ip, DO  folic acid (FOLVITE) 1  MG tablet Take 1 mg by mouth every morning. 1 tab per tube daily    Historical Provider, MD  furosemide (LASIX) 20 MG tablet TAKE 1 TABLET(20 MG) BY MOUTH DAILY AS NEEDED Patient taking differently: TAKE 1 TABLET(20 MG) BY MOUTH DAILY AS NEEDED 03/06/16   Ashly Hulen Skains, DO  guaiFENesin (MUCINEX) 600 MG 12 hr tablet Take 1 tablet (600 mg total) by mouth 2 (two) times daily. 06/23/15   Joanna Puff, MD  hydrocortisone cream 0.5 % Apply 1 application topically 2 (two) times daily. 10/06/15   Ashly M Gottschalk, DO  KEPPRA 100 MG/ML solution TAKE 12 MLS BY  MOUTH EVERY MORNING AND 12 MLS EVERY EVENING 01/12/16   Elveria Rising, NP  lactulose (CHRONULAC) 10 GM/15ML solution TAKE 30 ML BY MOUTH TWICE DAILY 05/17/15   Ashly Hulen Skains, DO  NYSTATIN powder APPLY TO AFFECTED DIAPER AREAS WITH EVERY DIAPER CHANGE UNTIL RASH CLEARS 01/12/16   Ashly M Gottschalk, DO  ondansetron (ZOFRAN) 4 MG/5ML solution TAKE 5 MLS FOR PAIN EVERY 8 HOURS AS NEEDED FOR NAUSEA OR VOMITING 06/29/14   Briscoe Deutscher, DO  pantoprazole sodium (PROTONIX) 40 mg/20 mL PACK Place 10 mLs (20 mg total) into feeding tube daily. 06/18/15   Ashly M Gottschalk, DO  TOPAMAX SPRINKLE 25 MG capsule TAKE 6 CAPSULES BY MOUTH IN THE MORNING AND 6 CAPSULES BY MOUTH IN THE EVENING WITH FOOD 02/07/16   Elveria Rising, NP    ALLERGIES:  Allergies  Allergen Reactions  . Carbamazepine Rash    SOCIAL HISTORY:  Social History  Substance Use Topics  . Smoking status: Never Smoker  . Smokeless tobacco: Never Used  . Alcohol use No    FAMILY HISTORY: Family History  Problem Relation Age of Onset  . Hypertension Mother   . Heart disease Maternal Grandmother     Died at 81  . Stroke Maternal Grandfather     died at 40  . Hypertension Paternal Grandmother   . Diabetes      MGGM  . Heart disease      MGGF  . Kidney disease      Maternal Great Aunt   . Cancer Maternal Aunt     lung  . Colon cancer Neg Hx     EXAM: BP 95/63 (BP Location: Left Arm)   Pulse 68   Temp 97.4 F (36.3 C) (Axillary)   Resp 18   SpO2 98%  CONSTITUTIONAL: Alert, Nonverbal, chronically ill-appearing, in no distress HEAD: Normocephalic EYES: Conjunctivae clear, PERRL ENT: normal nose; no rhinorrhea; moist mucous membranes NECK: Supple, no meningismus, no LAD  CARD: RRR; S1 and S2 appreciated; no murmurs, no clicks, no rubs, no gallops RESP: Normal chest excursion without splinting or tachypnea; breath sounds clear and equal bilaterally; no wheezes, no rhonchi, no rales, no hypoxia or respiratory  distress ABD/GI: Normal bowel sounds; non-distended; soft, non-tender, no rebound, no guarding, no peritoneal signs BACK:  The back appears normal and is non-tender to palpation, there is no CVA tenderness EXT: Normal ROM in all joints; non-tender to palpation; no edema; normal capillary refill; no cyanosis, no calf tenderness or swelling    SKIN: Normal color for age and race; warm; no rash NEURO: Moves all extremities equally, mild resting tremor in bilateral upper extremity is the patient is alert, awake and looking around. No seizure-like activity.  MEDICAL DECISION MAKING: Child here for reevaluation after recent admission for community-acquired pneumonia. Repeat labs seems to be at their baseline. Chest  x-ray shows that her left lower infiltrate is improving. She has not had any hypoxia in the emergency department her lungs are clear. No increased work of breathing. As for the shaking in her bilateral upper extremities that mother reports that she has had this in the past. It does not appear to be a seizure as it is in both arms and she is looking around, responsive. No known history of seizures. This does not seem to be rigors either as she is afebrile. Have recommended alternating Tylenol and ibuprofen for fever or any signs of pain. Have recommended they continue their antibiotics. She does not appear significant labral you overloaded to me on exam. I do not feel she needs diuretics. Mother feels that her legs are more swollen than normal but appear to be equal in size bilaterally. She did have a venous Doppler of her left lower extremity on October 18 which was negative. Will obtain a venous Doppler of her right lower extremity in the morning. Suspicion for DVT is low and therefore do not feel she needs a dose of therapeutic Lovenox at this time. They have PCP follow-up scheduled for tomorrow.  I feel you're safe to be discharged home. Mother is comfortable with this plan. This time I do not feel  she needs further emergent workup.   At this time, I do not feel there is any life-threatening condition present. I have reviewed and discussed all results (EKG, imaging, lab, urine as appropriate), exam findings with patient/family. I have reviewed nursing notes and appropriate previous records.  I feel the patient is safe to be discharged home without further emergent workup and can continue workup as an outpatient as needed. Discussed usual and customary return precautions. Patient/family verbalize understanding and are comfortable with this plan.  Outpatient follow-up has been provided. All questions have been answered.   I personally performed the services described in this documentation, which was scribed in my presence. The recorded information has been reviewed and is accurate.    Layla MawKristen N Arella Blinder, DO 04/13/16 [phone]

## 2016-04-13 NOTE — ED Notes (Signed)
Dr. Ward at bedside.

## 2016-04-14 ENCOUNTER — Telehealth: Payer: Self-pay | Admitting: Family Medicine

## 2016-04-14 ENCOUNTER — Other Ambulatory Visit: Payer: Self-pay | Admitting: Family Medicine

## 2016-04-14 MED ORDER — FLUCONAZOLE 150 MG PO TABS: 150.0000 mg | ORAL_TABLET | Freq: Once | ORAL | 0 refills | Status: AC

## 2016-04-14 NOTE — Telephone Encounter (Signed)
Done

## 2016-04-14 NOTE — Telephone Encounter (Signed)
Mother requesting meds for yeast inf. First dosage did not work. [facility] [address].

## 2016-04-17 ENCOUNTER — Ambulatory Visit (HOSPITAL_COMMUNITY)
Admission: RE | Admit: 2016-04-17 | Discharge: 2016-04-17 | Disposition: A | Discharge status: Home / Self Care | Payer: Medicaid Other | Source: Ambulatory Visit | Attending: Emergency Medicine | Admitting: Emergency Medicine

## 2016-04-17 DIAGNOSIS — M79609 Pain in unspecified limb: Secondary | ICD-10-CM | POA: Diagnosis not present

## 2016-04-17 DIAGNOSIS — M7989 Other specified soft tissue disorders: Secondary | ICD-10-CM | POA: Insufficient documentation

## 2016-04-17 NOTE — Progress Notes (Signed)
Preliminary results by tech - Venous Duplex Lower Ext. Completed. No evidence of obvious deep vein thrombosis in both legs.  Marilynne Halstedita Blyss Lugar, BS, RDMS, RVT

## 2016-04-18 ENCOUNTER — Encounter: Payer: Self-pay | Admitting: Family Medicine

## 2016-04-18 ENCOUNTER — Ambulatory Visit (INDEPENDENT_AMBULATORY_CARE_PROVIDER_SITE_OTHER): Payer: Medicaid Other | Admitting: Family Medicine

## 2016-04-18 VITALS — BP 124/65 | HR 92 | Temp 98.5°F | Ht 59.0 in

## 2016-04-18 DIAGNOSIS — Z09 Encounter for follow-up examination after completed treatment for conditions other than malignant neoplasm: Secondary | ICD-10-CM

## 2016-04-18 NOTE — Patient Instructions (Signed)
It was great seeing you today! We have addressed the following issues today  1. Today, we talked about how Michele [name] has been doing since she was discharged from the hospital. She finished her antibiotic course and has improved. She appears to be gasping for air at night every once in while, which is normal and part of the recovery process of pneumonia.  2. If you feel that Michele [name] is still struggling at night, please address it at your next visit with Dr.Gottschalk. 3. Results from Doppler Ultrasound of your legs are negative for blood clots. Keep wearing ted hoses and monitor for swelling.    If we did any lab work today, and the results require attention, either me or my nurse will get in touch with you. If everything is normal, you will get a letter in mail. If you don't hear from us in two weeks, please give us a call. Otherwise, we look forward to seeing you again at your next visit. If you have any questions or concerns before then, please call the clinic at [phone].   Please bring all your medications to every doctors visit   Sign up for My Chart to have easy access to your labs results, and communication with your Primary care physician.     Please check-out at the front desk before leaving the clinic.    Take Care,   Community-Acquired Pneumonia, Adult Pneumonia is an infection of the lungs. One type of pneumonia can happen while a person is in a hospital. A different type can happen when a person is not in a hospital (community-acquired pneumonia). It is easy for this kind to spread from person to person. It can spread to you if you breathe near an infected person who coughs or sneezes. Some symptoms include:  A dry cough.  A wet (productive) cough.  Fever.  Sweating.  Chest pain. HOME CARE  Take over-the-counter and prescription medicines only as told by your doctor.  Only take cough medicine if you are losing sleep.  If you were prescribed an antibiotic medicine,  take it as told by your doctor. Do not stop taking the antibiotic even if you start to feel better.  Sleep with your head and neck raised (elevated). You can do this by putting a few pillows under your head, or you can sleep in a recliner.  Do not use tobacco products. These include cigarettes, chewing tobacco, and e-cigarettes. If you need help quitting, ask your doctor.  Drink enough water to keep your pee (urine) clear or pale yellow. A shot (vaccine) can help prevent pneumonia. Shots are often suggested for:  People older than 29 years of age.  People older than 29 years of age:  Who are having cancer treatment.  Who have long-term (chronic) lung disease.  Who have problems with their body's defense system (immune system). You may also prevent pneumonia if you take these actions:  Get the flu (influenza) shot every year.  Go to the dentist as often as told.  Wash your hands often. If soap and water are not available, use hand sanitizer. GET HELP IF:  You have a fever.  You lose sleep because your cough medicine does not help. GET HELP RIGHT AWAY IF:  You are short of breath and it gets worse.  You have more chest pain.  Your sickness gets worse. This is very serious if:  You are an older adult.  Your body's defense system is weak.  You cough up blood.  This information is not intended to replace advice given to you by your health care provider. Make sure you discuss any questions you have with your health care provider.   Document Released: 11/22/2007 Document Revised: 02/24/2015 Document Reviewed: 09/30/2014 Elsevier Interactive Patient Education [employer]2016 [employer] Inc.

## 2016-04-18 NOTE — Progress Notes (Signed)
HPI:  Ernest PineRobin C Agro presents for hospital follow up. Patient was hospitalized from 04/04/2016 to 04/11/2016 with pneumonia most likely secondary to hyponatremia.   Patient's mother now reports patient (cerebral palsy-non verbal) is doing better since discharge from hospital. She has had no cough or increase work of breathing since discharge. Mum has a pulse oximetry at home and has been checking oxygen saturation during the day and at night. Patient maintaining good oxygen saturation during the day with some desaturation into the 80's per mom. Patient has good appetite and diarrhea has mostly resolved as mom has stopped her lactulose for the time being. Patient's mother reports that she quit her job to take care of [place] at home.  ROS: See HPI.  PMFSH: Active Ambulatory Problems    Diagnosis Date Noted  . Hypothyroidism 01/29/2009  . 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 Infantile cerebral palsy (HCC) 01/29/2009  . GASTROESOPHAGEAL REFLUX DISEASE 01/29/2009  . Osteoporosis 01/29/2009  . Seizure disorder (HCC) 01/29/2009  . URINARY INCONTINENCE 02/25/2010  . 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 Long-term use of high-risk medication 12/24/2012  . Morbid obesity (HCC) 12/24/2012  . 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 Amenorrhea 05/19/2014  . Hypernatremia 06/02/2014  . Special screening examination for human papillomavirus (HPV) 07/20/2014  . Rhinitis, non-allergic 06/23/2015  . Intertriginous candidiasis 09/15/2015  . Stye left upper eyelid 11/25/2015  . Productive cough 04/03/2016  . Community acquired pneumonia of right lower lobe of lung (HCC) 04/05/2016  . Hypoxia 04/05/2016  . Unsteadiness on feet   . Hypokalemia    Resolved Ambulatory Problems    Diagnosis Date Noted  . Fever 08/09/2010  . URI (upper respiratory infection)  08/23/2010  . Cellulitis 11/25/2010  . Gastrostomy tube dysfunction (HCC) 12/12/2010  . Candidal dermatitis 02/01/2011  . Leg swelling 03/10/2011  . Viral URI with cough 05/19/2011  . URI (upper respiratory infection) 12/14/2011  . Abnormality of gait 09/05/2012  . Right foot pain 06/18/2013  . Cellulitis of foot, right 08/13/2013  . Laceration re-check 02/05/2014  . Lethargy 04/02/2014  . Dehydration 04/16/2014  . Decreased oral intake 05/19/2014  . Declining functional status 05/19/2014   Past Medical History:  Diagnosis Date  . Cerebral palsy (HCC)   . Hypothyroidism   . Lennox-Gastaut syndrome (HCC)   . Osteoporosis   . Pneumonia   . Profound mental retardation   . Reflux   . Rickets   . Seizures (HCC)     PHYSICAL EXAM: BP 124/65   Pulse 92   Temp 98.5 F (36.9 C) (Oral)   Ht 4\' 11"  (1.499 m)   LMP 04/13/2016   SpO2 100%   GEN: appears well, no apparent distress, sitting in a wheelchair Head: normocephalic and atraumatic  Eyes: without conjunctival injection, sclera anicteric Ears: normal TM and ear canal,  Nares: No rhinorrhea, congestion or erythema,  Oropharynx: mmm without erythema or exudation CVS: RRR, normal s1 and s2, no murmurs, no edema RESP: no increased work of breathing, good air movement bilaterally, no crackles or wheeze MSK: Bilateral lower extremity edema GI: Bowel sounds present and normal, soft, non-tender,non-distended NEURO: alert, orientation was not tested ( pt with Cerebral palsy, non verbal), no gross defec   ASSESSMENT/PLAN: Patient is currently done well after discharge from hospital for pneumonia. Patient's mother reports desaturation intermittently at night oxygen. We discuss weight loss, sleep apnea and possibly sleep  study in the future if symptoms do not resolve or worsens. Patient understands that it will take a few days to weeks before pneumonia completely resolves. Patient will stay home for another week before resuming  school. We also discussed result from Doppler ultrasound of lower extremity that was negative for venous thrombosis.  FOLLOW UP: Follow up in clinic to assess progress in respiratory status  by PCP.  Lovena NeighboursAbdoulaye Jessikah Dicker, MD [facility]

## 2016-04-21 ENCOUNTER — Ambulatory Visit (INDEPENDENT_AMBULATORY_CARE_PROVIDER_SITE_OTHER): Payer: Medicaid Other

## 2016-04-21 ENCOUNTER — Ambulatory Visit (INDEPENDENT_AMBULATORY_CARE_PROVIDER_SITE_OTHER): Payer: Medicaid Other | Admitting: Sports Medicine

## 2016-04-21 ENCOUNTER — Encounter (INDEPENDENT_AMBULATORY_CARE_PROVIDER_SITE_OTHER): Payer: Self-pay | Admitting: Sports Medicine

## 2016-04-21 VITALS — BP 112/74 | HR 90 | Temp 96.9°F | Ht 59.0 in | Wt 197.0 lb

## 2016-04-21 DIAGNOSIS — R600 Localized edema: Secondary | ICD-10-CM | POA: Insufficient documentation

## 2016-04-21 DIAGNOSIS — L03116 Cellulitis of left lower limb: Secondary | ICD-10-CM

## 2016-04-21 DIAGNOSIS — M25572 Pain in left ankle and joints of left foot: Secondary | ICD-10-CM

## 2016-04-21 DIAGNOSIS — M7989 Other specified soft tissue disorders: Secondary | ICD-10-CM

## 2016-04-21 MED ORDER — DOXYCYCLINE HYCLATE 100 MG PO CAPS: 100.0000 mg | ORAL_CAPSULE | Freq: Two times a day (BID) | ORAL | 0 refills | Status: DC

## 2016-04-21 NOTE — Progress Notes (Signed)
Michele [name] - 30 y.o. female MRN [id number]  Date of birth: 12/12/1985  Office Visit Note: Visit Date: 04/21/2016 PCP: Delynn Flavin, DO Referred by: Raliegh Ip, DO  Subjective: Chief Complaint  Patient presents with  . Left Foot - Pain   HPI: Patient mother states that left foot is swollen.  Not sure if she injured foot or not.  Foot has been swollen for about 2 weeks.  Patient wearing [facility].  She has been evaluated in the hospital for recent pneumonia & is undergone 2 lower extremity venous Dopplers are negative. She's been having persistent ongoing swelling. There is a small skin lesion that her mom reports his worsened over the past several days. She denies any fevers or chills.    ROS Otherwise per HPI.  Assessment & Plan: Visit Diagnoses:  1. Pain in left ankle and joints of left foot   2. Left leg swelling   3. Leg edema, left   4. Cellulitis of leg, left     Plan: Findings:  I am concerned for an underlying cellulitis as the nidus for the erythema that is there. We will then treat her with doxycycline she is only recently been on Augmentin & will cover for MRSA. We'll plan a follow-up at the beginning of next week for close clinical reevaluation & discussed red flags that would warrant emergent evaluation including spiking fevers or chills. Also discussed the option to call on-call doctor either here or with no significant family practice. If persistent ongoing swelling at follow-up will need to consider further advanced imaging patient will likely not tolerate an MRI of the foot/ankle    Meds & Orders:  Meds ordered this encounter  Medications  . doxycycline (VIBRAMYCIN) 100 MG capsule    Sig: Take 1 capsule (100 mg total) by mouth 2 (two) times daily.    Dispense:  20 capsule    Refill:  0    Orders Placed This Encounter  Procedures  . XR Ankle 2 Views Left  . XR Foot 2 Views Left    Follow-up: Return in 4 days (on 04/25/2016) for repeat clinical  exam.   Procedures: No procedures performed  No notes on file   Clinical History: No specialty comments available.  She reports that she has never smoked. She has never used smokeless tobacco. No results for input(s): HGBA1C, LABURIC in the last 8760 hours.  Objective:  VS:  HT:4\' 11"  (149.9 cm)   WT:197 lb (89.4 kg)  BMI:39.9    BP:112/74  HR:90bpm  TEMP:(!) 96.9 F (36.1 C)( )  RESP:  Physical Exam  Constitutional:  Adult female with severe cognitive delay.  In no acute distress. Noncommunicative. Bilateral lower extremities overall well aligned.  Does have 2+ pitting edema in the left lower extremity compared to the right area & she does not appear to grimace except for with palpation of the area directly surrounding a small approximately 1 cm eschar along the medial tibia. There is a small amount of erythema adjacent to this as well as with dependent edema. Redness to slightly improve with elevation but does not tire ligament away. There is no area of fluctuance appreciated. DP & PT pulses are 1+/4. No grimace with palpation of the venous system which is also soft. Calf is supple.    Ortho Exam Imaging: Dg Chest 2 View  Result Date: 04/13/2016 CLINICAL DATA:  Shortness of breath with dry cough EXAM: CHEST  2 VIEW COMPARISON:  04/06/2016 FINDINGS: There  is improved aeration of the left lower lobe with mild bibasilar atelectasis or consolidations remaining. Small left effusion. Stable enlargement of the cardiomediastinal none. No pneumothorax. IMPRESSION: 1. Improved aeration of the left lower lobe with better visualization of the left diaphragm. Mild residual bibasilar atelectasis or infiltrates with small effusion 2. Stable cardiomegaly Electronically Signed   By: Jasmine PangKim  Fujinaga M.D.   On: 04/13/2016 00:19   Dg Chest 2 View  Result Date: 04/05/2016 CLINICAL DATA:  Acute onset of cough and concern for generalized chest pain. Initial encounter. EXAM: CHEST  2 VIEW COMPARISON:  Chest  radiograph performed 04/03/2016 FINDINGS: The lungs are hypoexpanded. Mild right basilar opacity may reflect pneumonia. Vascular crowding is noted. No pneumothorax or significant pleural effusion is seen. The cardiomediastinal silhouette is borderline normal in size. No acute osseous abnormalities are seen. IMPRESSION: Lungs hypoexpanded. Mild right basilar airspace opacity may reflect pneumonia. Electronically Signed   By: Roanna RaiderJeffery  Chang M.D.   On: 04/05/2016 01:23   Dg Chest 2 View  Result Date: 04/04/2016 CLINICAL DATA:  Productive cough, congestion EXAM: CHEST  2 VIEW COMPARISON:  Portable chest x-ray of 04/03/2014 and chest x-ray of 04/02/2014 FINDINGS: The lungs again are not well aerated with mild basilar volume loss present. No definite pneumonia or effusion is seen. Deployed inspiration results in some crowding of vessels of the lung bases. Heart size is stable. No bony abnormality is seen. IMPRESSION: Poor inspiration with basilar volume loss. No definite active process. Electronically Signed   By: Dwyane DeePaul  Barry M.D.   On: 04/04/2016 08:51   Ct Angio Chest Pe W Or Wo Contrast  Result Date: 04/05/2016 CLINICAL DATA:  30 year old female with hypoxia and right cough. EXAM: CT ANGIOGRAPHY CHEST WITH CONTRAST TECHNIQUE: Multidetector CT imaging of the chest was performed using the standard protocol during bolus administration of intravenous contrast. Multiplanar CT image reconstructions and MIPs were obtained to evaluate the vascular anatomy. CONTRAST:  100 cc Isovue 370 COMPARISON:  Chest radiograph dated 04/05/2016 FINDINGS: Evaluation is limited due to streak artifact caused by patient's arms and respiratory motion artifact. Cardiovascular: The thoracic aorta appears unremarkable. The origins of the great vessels of the aortic arch appear patent. There is no CT evidence of pulmonary embolism. Mild cardiomegaly. No pericardial effusion. Mediastinum/Nodes: There is no hilar or mediastinal adenopathy.  There is a small hiatal hernia. The esophagus is grossly unremarkable. Lungs/Pleura: There is consolidative changes of the posterior segment of the left upper lobe. Linear and streaky densities with patchy areas of consolidative changes in the lower lobes bilaterally most compatible with multifocal pneumonia. Underlying mass is not excluded. Clinical correlation and follow-up resolution recommended. There is no pleural effusion or pneumothorax. The central airways are patent. Upper Abdomen: Multiple enhancing lesions throughout the liver which the largest lesion measuring approximately 15 x 15 mm in the right lobe and 16 x 20 mm in the left lobe. These lesions are indeterminate and although may represent hemangioma, metastatic disease is not excluded. MRI without and with contrast is recommended for further characterization. The remainder of the visualized upper abdomen is unremarkable. Musculoskeletal: There is no axillary adenopathy. The chest wall soft tissues appear unremarkable. There is diffuse sclerotic appearance of the bones may be related to underlying hematological or metabolic disease or represent renal osteodystrophy. Clinical correlation is recommended. No acute fracture. Review of the MIP images confirms the above findings. IMPRESSION: No CT evidence of pulmonary embolism. Consolidative changes involving the left upper lobe and lower lobes bilaterally most compatible  with multifocal pneumonia. Clinical correlation and follow-up to resolution recommended. **An incidental finding of potential clinical significance has been found. Multiple enhancing hepatic lesions, incompletely characterized may represent hemangioma versus metastatic disease. MRI without and with contrast is recommended for further evaluation.** Diffuse osseous sclerosis may be related to underlying hematological or metabolic disease or represent renal osteodystrophy. Clinical correlation is recommended. No acute fracture.  Electronically Signed   By: Elgie CollardArash  Radparvar M.D.   On: 04/05/2016 06:45   Dg Chest Port 1 View  Result Date: 04/06/2016 CLINICAL DATA:  Pneumonia. EXAM: PORTABLE CHEST 1 VIEW COMPARISON:  CT 04/05/2016.  Chest x-ray 04/05/2016. FINDINGS: Borderline cardiomegaly. Multifocal pulmonary infiltrates are noted with infiltrates in the left upper lobe and both lower lobes. Left lower lobe consolidation is particularly prominent. Component of atelectasis most likely present. Small left pleural effusion. No pneumothorax. IMPRESSION: 1. Persistent multifocal pulmonary infiltrates with particularly dense consolidation in the left lower lobe consistent with pneumonia. Small left pleural effusion cannot be excluded. 2. Borderline cardiomegaly. Electronically Signed   By: Maisie Fushomas  Register   On: 04/06/2016 08:47   Xr Ankle 2 Views Left  Result Date: 04/25/2016 2V x-ray left foot & 2V x-ray left ankle obtained during office visit No evidence of acute fracture or dislocation of the ankle or foot although there is evidence of prior fifth metatarsal fracture that appears to be well-healed. Marked osteopenia with well-healed prior fractures of the metatarsals. No acute ankle fracture.  Xr Foot 2 Views Left  Result Date: 04/25/2016 2V x-ray left foot & 2V x-ray left ankle obtained during office visit No evidence of acute fracture or dislocation of the ankle or foot although there is evidence of prior fifth metatarsal fracture that appears to be well-healed. Marked osteopenia with well-healed prior fractures of the metatarsals. No acute ankle fracture.   Past Medical/Family/Surgical/Social History: Medications & Allergies reviewed per EMR Patient Active Problem List   Diagnosis Date Noted  . Leg edema, left 04/21/2016  . Hypokalemia   . Unsteadiness on feet   . Community acquired pneumonia of right lower lobe of lung (HCC) 04/05/2016  . Hypoxia 04/05/2016  . Productive cough 04/03/2016  . Stye left upper eyelid  11/25/2015  . Intertriginous candidiasis 09/15/2015  . Rhinitis, non-allergic 06/23/2015  . Special screening examination for human papillomavirus (HPV) 07/20/2014  . Hypernatremia 06/02/2014  . Amenorrhea 05/19/2014  . 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 Long-term use of high-risk medication 12/24/2012  . Morbid obesity (HCC) 12/24/2012  . 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 URINARY INCONTINENCE 02/25/2010  . 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 Hypothyroidism 01/29/2009  . 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 Infantile cerebral palsy (HCC) 01/29/2009  . GASTROESOPHAGEAL REFLUX DISEASE 01/29/2009  . Osteoporosis 01/29/2009  . Seizure disorder (HCC) 01/29/2009   Past Medical History:  Diagnosis Date  . Cerebral palsy (HCC)   . Hypothyroidism   . Lennox-Gastaut syndrome (HCC)   . Osteoporosis   . Pneumonia   . Profound mental retardation   . Reflux   . Rickets   . Seizures (HCC)    Family History  Problem Relation Age of Onset  . Hypertension Mother   . Heart disease Maternal Grandmother     Died at 6764  . Stroke Maternal Grandfather     died at 1679  . Hypertension Paternal Grandmother   . Diabetes      MGGM  . Heart disease  MGGF  . Kidney disease      Maternal Great Aunt   . Cancer Maternal Aunt     lung  . Colon cancer Neg Hx    Past Surgical History:  Procedure Laterality Date  . Gastrocutaneous fistual repair  2012  . PEG TUBE PLACEMENT     Social History   Occupational History  . Disabled     Social History Main Topics  . Smoking status: Never Smoker  . Smokeless tobacco: Never Used  . Alcohol use No  . Drug use: No  . Sexual activity: No

## 2016-04-24 ENCOUNTER — Ambulatory Visit (INDEPENDENT_AMBULATORY_CARE_PROVIDER_SITE_OTHER): Payer: Medicaid Other | Admitting: Sports Medicine

## 2016-04-25 ENCOUNTER — Encounter (INDEPENDENT_AMBULATORY_CARE_PROVIDER_SITE_OTHER): Payer: Self-pay

## 2016-04-25 ENCOUNTER — Ambulatory Visit (INDEPENDENT_AMBULATORY_CARE_PROVIDER_SITE_OTHER): Payer: Medicaid Other | Admitting: Sports Medicine

## 2016-04-25 ENCOUNTER — Encounter (INDEPENDENT_AMBULATORY_CARE_PROVIDER_SITE_OTHER): Payer: Self-pay | Admitting: Sports Medicine

## 2016-04-25 VITALS — BP 114/85 | HR 71 | Ht 59.0 in | Wt 185.0 lb

## 2016-04-25 DIAGNOSIS — L02612 Cutaneous abscess of left foot: Secondary | ICD-10-CM | POA: Diagnosis not present

## 2016-04-25 NOTE — Progress Notes (Addendum)
Michele PineRobin C [name] - 30 y.o. female MRN [id number]  Date of birth: 1986/04/24  Office Visit Note: Visit Date: 04/25/2016 PCP: Delynn FlavinAshly Gottschalk, DO Referred by: Raliegh IpGottschalk, Ashly M, DO  Subjective: Chief Complaint  Patient presents with  . Left Foot - Pain  . Follow-up    4 Day Recheck   HPI: Mom states left foot is still swollen, but patient is walking around on her left foot.  No focal pain reported. She has been taking doxycycline. She is status post Augmentin for pneumonia. Small eschar along the medial tibia has improved however the swelling across the dorsum of the foot is persistently swollen & slightly erythematous..    ROS Otherwise per HPI.  Assessment & Plan: Visit Diagnoses:  1. Cutaneous abscess of left foot     Plan: Findings:  Clinical exam today as well as brief limited MSK ultrasound shows fluid collection across the dorsum of the foot. I&D of this lesion revealed a hematoma with moderate amount of exudate & no obvious gross pus. Basic wound care & continue with antibiotics previously prescribed. Close follow-up at the beginning of next week for wound check. If any ongoing issues or fevers or chills further earlier evaluation is warranted. She should continue with doxycycline. Wound culture was sent.    Meds & Orders: No orders of the defined types were placed in this encounter.   Orders Placed This Encounter  Procedures  . INCISION AND DRAINAGE (AMB ORTHO)  . Wound culture    Follow-up: Return in about 6 days (around 05/01/2016), or for wound check.   Procedures: Incision & Drainage Date/Time: 04/25/2016 9:40 AM Performed by: Gaspar BiddingIGBY, Tadeusz Stahl D Authorized by: Gaspar BiddingIGBY, Trelyn Vanderlinde D   Consent:    Consent obtained:  Verbal   Consent given by:  Parent   Risks discussed:  Bleeding, incomplete drainage and infection   Alternatives discussed:  No treatment Location:    Type:  Abscess   Location:  Lower extremity   Lower extremity location:  Foot Pre-procedure details:     Skin preparation:  Alcohol and Betadine Anesthesia (see MAR for exact dosages):    Anesthesia method:  Topical application and local infiltration   Local anesthetic:  Lidocaine 1% w/o epi Procedure type:    Complexity:  Simple Procedure details:    Needle aspiration: no     Incision types:  Single straight   Incision depth:  Dermal   Scalpel blade:  10   Wound management:  Probed and deloculated   Drainage:  Bloody   Drainage amount:  Moderate   Wound treatment:  Wound left open   Packing materials:  None Post-procedure details:    Patient tolerance of procedure:  Tolerated well, no immediate complications    No notes on file   Clinical History: No specialty comments available.  She reports that she has never smoked. She has never used smokeless tobacco. No results for input(s): HGBA1C, LABURIC in the last 8760 hours.  Objective:  VS:  HT:4\' 11"  (149.9 cm)   WT:185 lb (83.9 kg)  BMI:37.4    BP:114/85  HR:71bpm  TEMP: ( )  RESP:  Physical Exam  Constitutional:  Adult female with severe cognitive delay.  In no acute distress. Noncommunicative. Bilateral lower extremities overall well aligned.  Does have 2+ pitting edema in the left lower extremity compared to the right. There is a focal area of induration & fluctuance across the dorsum of the left foot that is not present on the right. She does  seem to grimace today with palpation of this area. Small approximately 1 cm eschar along the medial tibia is no longer tender & there is only a small area of induration surrounding it which is nontender. No erythema along the medial tibia... DP & PT pulses are 1+/4. No grimace with palpation of the venous system which is also soft. Calf is supple.    Ortho Exam Imaging: No results found.  Past Medical/Family/Surgical/Social History: Medications & Allergies reviewed per EMR Patient Active Problem List   Diagnosis Date Noted  . Leg edema, left 04/21/2016  . Hypokalemia   .  Unsteadiness on feet   . Community acquired pneumonia of right lower lobe of lung (HCC) 04/05/2016  . Hypoxia 04/05/2016  . Productive cough 04/03/2016  . Stye left upper eyelid 11/25/2015  . Intertriginous candidiasis 09/15/2015  . Rhinitis, non-allergic 06/23/2015  . Special screening examination for human papillomavirus (HPV) 07/20/2014  . Hypernatremia 06/02/2014  . Amenorrhea 05/19/2014  . 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 Long-term use of high-risk medication 12/24/2012  . Morbid obesity (HCC) 12/24/2012  . 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 URINARY INCONTINENCE 02/25/2010  . 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 Hypothyroidism 01/29/2009  . 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 Infantile cerebral palsy (HCC) 01/29/2009  . GASTROESOPHAGEAL REFLUX DISEASE 01/29/2009  . Osteoporosis 01/29/2009  . Seizure disorder (HCC) 01/29/2009   Past Medical History:  Diagnosis Date  . Cerebral palsy (HCC)   . Hypothyroidism   . Lennox-Gastaut syndrome (HCC)   . Osteoporosis   . Pneumonia   . Profound mental retardation   . Reflux   . Rickets   . Seizures (HCC)    Family History  Problem Relation Age of Onset  . Hypertension Mother   . Heart disease Maternal Grandmother     Died at 6464  . Stroke Maternal Grandfather     died at 5779  . Hypertension Paternal Grandmother   . Diabetes      MGGM  . Heart disease      MGGF  . Kidney disease      Maternal Great Aunt   . Cancer Maternal Aunt     lung  . Colon cancer Neg Hx    Past Surgical History:  Procedure Laterality Date  . Gastrocutaneous fistual repair  2012  . PEG TUBE PLACEMENT     Social History   Occupational History  . Disabled     Social History Main Topics  . Smoking status: Never Smoker  . Smokeless tobacco: Never Used  . Alcohol use No  . Drug use: No  . Sexual activity: No

## 2016-04-28 ENCOUNTER — Encounter: Payer: Self-pay | Admitting: Family Medicine

## 2016-04-28 ENCOUNTER — Ambulatory Visit (INDEPENDENT_AMBULATORY_CARE_PROVIDER_SITE_OTHER): Payer: Medicaid Other | Admitting: Family Medicine

## 2016-04-28 VITALS — BP 100/70 | HR 80 | Temp 97.6°F

## 2016-04-28 DIAGNOSIS — G809 Cerebral palsy, unspecified: Secondary | ICD-10-CM

## 2016-04-28 DIAGNOSIS — G808 Other cerebral palsy: Secondary | ICD-10-CM

## 2016-04-28 DIAGNOSIS — L089 Local infection of the skin and subcutaneous tissue, unspecified: Secondary | ICD-10-CM | POA: Diagnosis not present

## 2016-04-28 DIAGNOSIS — R5381 Other malaise: Secondary | ICD-10-CM | POA: Diagnosis not present

## 2016-04-28 LAB — WOUND CULTURE
GRAM STAIN: NONE SEEN
Gram Stain: NONE SEEN
Organism ID, Bacteria: NO GROWTH

## 2016-04-28 NOTE — Progress Notes (Signed)
    Subjective: CC: pneumonia, mobility, deconditioning HPI: Michele [name] is a [age] y.o. female presenting to clinic today for follow up. Concerns today include:  Pneumonia Patient's breathing has gotten better since hospitalization.  She denies cough, fevers, chills.  She notes that her O2 saturation goes to 90% every few days.  She reports she has been applying O2 that she had at home and she improves with this.  No increased WOB.  She is swallowing back to baseline again.  No choking/ aspiration since hospitalization.  Mobility/ Deconditioning Patient has history of CP.  She was evaluated in hospital by PT, who recommended HH PT.  [facility] [place].  She reports that she has gotten a little stronger since discharge but is not back to baseline yet.  [facility] working to obtain a new cushion for patient.  She is doing well with her new wheelchair.  LLE abscess Diagnosed by Dr Berline Choughigby.  I&D in office and placed on Doxycyline for next 10 days.  Has follow up with him next week.  Social History Reviewed. FamHx and MedHx reviewed.  Please see EMR.  ROS: Per HPI  Objective: Office vital signs reviewed. BP 100/70   Pulse 80   Temp 97.6 F (36.4 C) (Axillary)   LMP 04/13/2016   SpO2 99%   Physical Examination:  General: Awake, alert, well nourished, well appearing female, has cerebral palsy and is wheelchair bound, No acute distress HEENT: Normal    Eyes: sclera white    Nose: no rhinorrhea    Throat: moist mucus membranes, no erythema Cardio: regular rate and rhythm, S1S2 heard, no murmurs appreciated Pulm: clear to auscultation bilaterally, no wheezes, rhonchi or rales, normal WOB on room air Extremities: warm, Left foot with small incision, +1 edema of foot, no increased warmth compared to right.  Assessment/ Plan: 30 y.o. female   1. Infantile cerebral palsy (HCC) - Ambulatory referral to Physical Therapy  2. Congenital quadriplegia (HCC) -  Ambulatory referral to Physical Therapy  3. Physical deconditioning after hospitalization for pneumonia.  She is doing somewhat better but is still not back to baseline function. - Ambulatory referral to Physical Therapy [facility] - Note provided for today's appt for day program  4. Soft tissue infection.  No evidence of worsening soft tissue infection/ cellulitis.  Doing well on Doxy.  Afebrile and nontoxic appearing on exam - s/p I/D on Doxycyline until 11/17. - Follow up with Dr Berline Choughigby as scheduled  Follow up with me in January for routine check in or sooner if needed.  Raliegh IpAshly M Osker Ayoub, DO PGY-3, [facility]

## 2016-04-28 NOTE — Patient Instructions (Signed)
[name] is looking so much better.  [facility]  Please let me know if there is any difficulty obtaining this appointment.  Follow up with Dr Berline Choughigby as scheduled

## 2016-05-01 ENCOUNTER — Other Ambulatory Visit: Payer: Self-pay | Admitting: Family Medicine

## 2016-05-01 ENCOUNTER — Encounter (INDEPENDENT_AMBULATORY_CARE_PROVIDER_SITE_OTHER): Payer: Self-pay | Admitting: Sports Medicine

## 2016-05-01 ENCOUNTER — Ambulatory Visit (INDEPENDENT_AMBULATORY_CARE_PROVIDER_SITE_OTHER): Payer: Medicaid Other | Admitting: Sports Medicine

## 2016-05-01 VITALS — BP 106/75 | HR 67 | Ht 59.0 in | Wt 185.0 lb

## 2016-05-01 DIAGNOSIS — R600 Localized edema: Secondary | ICD-10-CM

## 2016-05-01 DIAGNOSIS — L03116 Cellulitis of left lower limb: Secondary | ICD-10-CM

## 2016-05-01 DIAGNOSIS — L02612 Cutaneous abscess of left foot: Secondary | ICD-10-CM

## 2016-05-01 NOTE — Progress Notes (Signed)
   Ernest PineRobin C Hermans - 30 y.o. female MRN [id number]  Date of birth: [birth year]  Office Visit Note: Visit Date: 05/01/2016 PCP: Delynn FlavinAshly Gottschalk, DO Referred by: Raliegh IpGottschalk, Ashly M, DO  Subjective: Chief Complaint  Patient presents with  . Left Foot - Wound Check  . Follow-up    Patient wearing post op shoe.  Mom states left foot wound looks good.   HPI: Patient is status post I&D of left foot wound last week. Mom reports that overall the wound is well-healed. She continues to Band-Aid keep it covered as it is not completely closed. She is not having any fevers or chills. No night sweats.  ROS:. Otherwise per HPI.  Assessment & Plan: Visit Diagnoses:  1. Cutaneous abscess of left foot   2. Leg edema, left   3. Cellulitis of leg, left     Plan: This is healing up nicely. Cultures were sterile however did show many PMNs.Doreatha Martin. Finish antibiotics this week. Dressing changes as needed. Okay to return to school next week. Meds: No orders of the defined types were placed in this encounter.  Follow-up: Return if symptoms worsen or fail to improve.   Clinical History: No specialty comments available.  She reports that she has never smoked. She has never used smokeless tobacco. No results for input(s): HGBA1C, LABURIC in the last 8760 hours.  Objective:  VS:  HT:4\' 11"  (149.9 cm)   WT:185 lb (83.9 kg)  BMI:37.4    BP:106/75  HR:67bpm  TEMP: ( )  RESP:  Physical Exam: Left foot is overall well aligned. No surrounding erythema. Calf wound is well-healed. There is no fluctuance. Incision is clean dry & intact although not completely epithelialized.  Imaging: No results found.

## 2016-05-30 ENCOUNTER — Other Ambulatory Visit: Payer: Self-pay | Admitting: Family Medicine

## 2016-05-30 DIAGNOSIS — K219 Gastro-esophageal reflux disease without esophagitis: Secondary | ICD-10-CM

## 2016-06-01 ENCOUNTER — Telehealth: Payer: Self-pay | Admitting: Family Medicine

## 2016-06-01 NOTE — Telephone Encounter (Signed)
Clinical info completed on Certification of Medical Necessity form.  Place form in Dr. Doreen SalvageGottchalk's box for completion.  Michele [name], Michele [name], [place]

## 2016-06-01 NOTE — Telephone Encounter (Signed)
Certification of medial necessity  form dropped off for at front desk for completion.  Verified that patient section of form has been completed.  Last DOS with PCP was 04/28/16.  Placed form in white team folder to be completed by clinical staff.  Lina Sarheryl A Stanley

## 2016-06-02 ENCOUNTER — Other Ambulatory Visit: Payer: Self-pay | Admitting: Family

## 2016-06-02 DIAGNOSIS — G40319 Generalized idiopathic epilepsy and epileptic syndromes, intractable, without status epilepticus: Secondary | ICD-10-CM

## 2016-06-02 NOTE — Telephone Encounter (Signed)
Patient's mom informed that forms are complete and ready for pick up.  Clovis PuMartin, Tachina Spoonemore L, RN

## 2016-06-02 NOTE — Telephone Encounter (Signed)
Completed and returned to Tamika.  

## 2016-06-05 ENCOUNTER — Telehealth (INDEPENDENT_AMBULATORY_CARE_PROVIDER_SITE_OTHER): Payer: Self-pay | Admitting: Family

## 2016-06-05 NOTE — Telephone Encounter (Signed)
-----   Message from [name] Goodpasture, NP sent at 06/02/2016  3:22 PM EST ----- °Regarding: Needs appointment °Michele [name] needs an appointment with me.  °Thanks,  °[name] °

## 2016-06-05 NOTE — Telephone Encounter (Signed)
-----   Message from Elveria Risingina Goodpasture, NP sent at 06/02/2016  3:22 PM EST ----- Regarding: Needs appointment Garnette needs an appointment with me.  Thanks,  Inetta Fermoina

## 2016-06-05 NOTE — Telephone Encounter (Signed)
Schedule appt for June 26, 2016 at 8:15am

## 2016-06-26 ENCOUNTER — Ambulatory Visit (INDEPENDENT_AMBULATORY_CARE_PROVIDER_SITE_OTHER): Payer: Medicaid Other | Admitting: Family

## 2016-06-26 ENCOUNTER — Encounter (INDEPENDENT_AMBULATORY_CARE_PROVIDER_SITE_OTHER): Payer: Self-pay | Admitting: Family

## 2016-06-26 VITALS — BP 126/74 | HR 80

## 2016-06-26 DIAGNOSIS — G808 Other cerebral palsy: Secondary | ICD-10-CM

## 2016-06-26 DIAGNOSIS — G40319 Generalized idiopathic epilepsy and epileptic syndromes, intractable, without status epilepticus: Secondary | ICD-10-CM | POA: Diagnosis not present

## 2016-06-26 DIAGNOSIS — F73 Profound intellectual disabilities: Secondary | ICD-10-CM | POA: Diagnosis not present

## 2016-06-26 MED ORDER — TOPAMAX SPRINKLE 25 MG PO CPSP: ORAL_CAPSULE | ORAL | 5 refills | Status: DC

## 2016-06-26 MED ORDER — DIASTAT ACUDIAL 20 MG RE GEL: RECTAL | 5 refills | Status: DC

## 2016-06-26 MED ORDER — KEPPRA 100 MG/ML PO SOLN: ORAL | 5 refills | Status: DC

## 2016-06-26 MED ORDER — BANZEL 40 MG/ML PO SUSP: ORAL | 5 refills | Status: DC

## 2016-06-26 MED ORDER — DEPAKOTE SPRINKLES 125 MG PO CSDR: DELAYED_RELEASE_CAPSULE | ORAL | 5 refills | Status: DC

## 2016-06-26 MED ORDER — CARNITOR 1 GM/10ML PO SOLN: ORAL | 5 refills | Status: DC

## 2016-06-26 NOTE — Patient Instructions (Signed)
Continue Michele [name]'s medications as you have been giving them. Let me know if she has any seizures.   Please return for follow up in 6 months or sooner if needed.

## 2016-06-26 NOTE — Progress Notes (Signed)
Patient: Michele [name]: [id number] Sex: female DOB: Nov 10, 1985  Provider: Elveria Rising, NP Location of Care: [facility] Child [facility]  Note type: Routine return visit  History of Present Illness: Referral Source: Raliegh Ip, DO History from: [facility] chart and parent Chief Complaint: Epilepsy  Michele ALBERSON is a 31 y.o. 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 She was last seen November 25, 2015. Anysha had seizures as a child that were associated with high fever. She has quadriparesis and is dependent upon others for activities of daily living. Her mother calls between visits to report seizures. Angele is taking and tolerating Depakote, Keppra, Topamax and Banzel for her seizure disorder. Mom reports today that Michele [name] had a convulsive seizure on June 23, 2016 that lasted about 1 minute. She said that there were no missed medications, that [name] had been sleeping well and had been otherwise healthy. Mom could identify no trigger for the seizure.   Mom said that Michele [name] had surgery on her left foot in October and that she thinks that she is going to start Physical Therapy soon to get her up and more mobile. She said that Michele [name] has recently received a new wheelchair but that seat cushion does not fit her properly and that a new one has been ordered. Michele [name] continues to continue a day program and enjoys that activity. Mom said that Michele [name] has been otherwise healthy since last seen and Mom has no other health concerns today other than previously mentioned.   Review of Systems: Please see the HPI for neurologic and other pertinent review of systems. Otherwise, the following systems are noncontributory including constitutional, eyes, ears, nose and throat, cardiovascular, respiratory, gastrointestinal, genitourinary, musculoskeletal, skin, endocrine, hematologic/lymph, allergic/immunologic and psychiatric.   Past Medical History:  Diagnosis  Date  . Cerebral palsy (HCC)   . Hypothyroidism   . Lennox-Gastaut syndrome (HCC)   . Osteoporosis   . Pneumonia   . Profound mental retardation   . Reflux   . Rickets   . Seizures (HCC)    Hospitalizations: No., Head Injury: No., Nervous System Infections: No., Immunizations up to date: Yes.   Past Medical History Comments: See history  Surgical History Past Surgical History:  Procedure Laterality Date  . Gastrocutaneous fistual repair  2012  . PEG TUBE PLACEMENT      Family History family history includes Cancer in her maternal aunt; Heart disease in her maternal grandmother; Hypertension in her mother and paternal grandmother; Stroke in her maternal grandfather. Family History is otherwise negative for migraines, seizures, cognitive impairment, blindness, deafness, birth defects, chromosomal disorder, autism.  Social History Social History   Social History  . Marital status: Single    Spouse name: N/A  . Number of children: 0  . Years of education: N/A   Occupational History  . Disabled     Social History Main Topics  . Smoking status: Never Smoker  . Smokeless tobacco: Never Used  . Alcohol use No  . Drug use: No  . Sexual activity: No   Other Topics Concern  . Not on file   Social History Narrative   0 caffeine drinks    Lives with mother who helps ADLs.   Graduated from [employer] in 2010. Now attends day program at [employer].    Enjoys playing in water, ripping paper, watching TV, music and going out.    Allergies Allergies  Allergen Reactions  . Carbamazepine Rash    Physical  Exam BP 126/74   Pulse 80   LMP 05/08/2016 (Within Days) Comment: Irregular General: well developed, well nourished female, seated in a wheelchair, in no evident distress  Head: head normocephalic and atraumatic.  Neck: supple with no carotid or supraclavicular bruits.  Respiratory: clear to auscultation  Cardiovascular: regular rate and rhythm, no murmurs. Her hands,  lower legs and feet are edematous but less so than in previous visits.  Musculoskeletal: no obvious deformities of her limbs, but it is difficult to assess due to edema and adipose tissue covering landmarks on her legs and feet.  Skin: She has a surgical scar on her abdomen. She has a scar on her forehead. She has no upper front teeth. She has no areas of pressure or broken skin. She has no rashes or lesions.   Neurologic Exam  Mental Status: Awake and fully alert. She smiles responsively and laughs spontaneously. She claps her hands repeatedly. She has significant mental retardation. She has no language but occasionally makes vocalizations. Her mother says that she can say "Robbie", her mother's nickname for her, but that word is not intelligible to me. She is unable to follow any commands.  Cranial Nerves: Fundoscopic exam reveals sharp disc margins. Pupils equal, briskly reactive to light. Extraocular movements appeared to be intact. Visual fields appeared full. Turns to localize sounds. Face, tongue, palate move normally and symmetrically. Neck flexion and extension normal.  Motor: She has mild quadriparesis. She has increased tone, particularly in her legs.  Sensory: Withdrawal x 4.  Coordination: She had fairly good fine motor movements with her hands. She could not follow instructions for adequate coordination testing. She did do well with hand clapping but I could not get her to do purposeful grabbing today. Gait and Station: She is wheelchair bound. I did not get her out of her chair today. Reflexes: Diminished and symmetric. It was difficult to assess reflexes with her edema. I could not get plantar reflex response  Impression 1. 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 Congenital quadriplegia 4. Significant intellectual disability  Recommendations for plan of care The patient's previous [facility] records were reviewed. Venise has neither had nor required  imaging or lab studies since the last visit. 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 She is dependent upon others for all activities of daily living. Henry had one seizure last week but it was brief and the first she has had in more than 6 months. I will make no changes in her treatment plan at this time. Mom will continue to report seizure activity when it occurs. I will see Tommye back in follow up in 6 months or sooner if needed. Mom agreed with the plans made today.  The medication list was reviewed and reconciled.  No changes were made in the prescribed medications today.  A complete medication list was provided to the patient/caregiver.  Allergies as of 06/26/2016      Reactions   Carbamazepine Rash      Medication List       Accurate as of 06/26/16 11:59 PM. Always use your most recent med list.          alendronate 70 MG tablet Commonly known as:  FOSAMAX TAKE 1 TABLET BY MOUTH EVERY 7 DAYS ON SATURDAY WITH A FULL GLASS OF WATER ON AN EMPTY STOMACH   BANZEL 40 MG/ML Susp Generic drug:  Rufinamide  TAKE 5 MLS BY MOUTH IN THE MORNING AND 5 MLS BY MOUTH AT NIGHT   CALCARB 600/D 600-400 MG-UNIT tablet Generic drug:  Calcium Carbonate-Vitamin D Take 1 tablet by mouth 2 (two) times daily. 1 tab per tube 2 time a day   CARNITOR 1 GM/10ML solution Generic drug:  levOCARNitine TAKE 5 MLS BY MOUTH THREE TIMES DAILY   clotrimazole 1 % cream Commonly known as:  LOTRIMIN APPLY EXTERNALLY TO THE AFFECTED AREA TWICE DAILY   DEPAKOTE SPRINKLES 125 MG capsule Generic drug:  divalproex TAKE 8 CAPSULES BY MOUTH TWICE DAILY   DIASTAT ACUDIAL 20 MG Gel Generic drug:  diazepam DIAL TO 15 MG, GIVE RECTALLY FOR SEIZURES LASTING 2 MINUTES OR LONGER   feeding supplement (ENSURE ENLIVE) Liqd Take 237 mLs by mouth 3 (three) times daily between meals.     fluticasone 50 MCG/ACT nasal spray Commonly known as:  FLONASE SHAKE LIQUID AND USE 2 SPRAYS IN EACH NOSTRIL DAILY   folic acid 1 MG tablet Commonly known as:  FOLVITE Take 1 mg by mouth every morning. 1 tab per tube daily   furosemide 20 MG tablet Commonly known as:  LASIX TAKE 1 TABLET(20 MG) BY MOUTH DAILY AS NEEDED   hydrocortisone cream 0.5 % Apply 1 application topically 2 (two) times daily.   KEPPRA 100 MG/ML solution Generic drug:  levETIRAcetam TAKE 12 MLS BY MOUTH EVERY MORNING AND 12 MLS EVERY EVENING   lactulose 10 GM/15ML solution Commonly known as:  CHRONULAC TAKE 30 ML BY MOUTH TWICE DAILY   nystatin powder Generic drug:  nystatin APPLY TO AFFECTED DIAPER AREAS WITH EVERY DIAPER CHANGE UNTIL RASH CLEARS   ondansetron 4 MG/5ML solution Commonly known as:  ZOFRAN TAKE 5 MLS FOR PAIN EVERY 8 HOURS AS NEEDED FOR NAUSEA OR VOMITING   PROTONIX 40 mg/20 mL Pack Generic drug:  pantoprazole sodium PLACE 10 ML INTO THE FEEDING TUBE DAILY   TOPAMAX SPRINKLE 25 MG capsule Generic drug:  topiramate TAKE 6 CAPSULES BY MOUTH IN THE MORNING AND 6 CAPSULES BY MOUTH IN THE EVENING WITH FOOD      Dr. Sharene SkeansHickling was consulted regarding the patient.   Total time spent with the patient was 30 minutes, of which 50% or more was spent in counseling and coordination of care.   Elveria Risingina Liley Rake NP-C

## 2016-07-02 ENCOUNTER — Other Ambulatory Visit: Payer: Self-pay | Admitting: Family Medicine

## 2016-07-31 ENCOUNTER — Other Ambulatory Visit: Payer: Self-pay | Admitting: Family Medicine

## 2016-07-31 DIAGNOSIS — G40319 Generalized idiopathic epilepsy and epileptic syndromes, intractable, without status epilepticus: Secondary | ICD-10-CM

## 2016-08-03 ENCOUNTER — Other Ambulatory Visit: Payer: Self-pay | Admitting: Family Medicine

## 2016-08-03 DIAGNOSIS — R600 Localized edema: Secondary | ICD-10-CM

## 2016-08-15 ENCOUNTER — Encounter (HOSPITAL_COMMUNITY): Payer: Self-pay | Admitting: Emergency Medicine

## 2016-08-15 ENCOUNTER — Telehealth: Payer: Self-pay | Admitting: *Deleted

## 2016-08-15 ENCOUNTER — Emergency Department (HOSPITAL_COMMUNITY): Payer: Medicaid Other

## 2016-08-15 ENCOUNTER — Emergency Department (HOSPITAL_COMMUNITY)
Admission: EM | Admit: 2016-08-15 | Discharge: 2016-08-15 | Disposition: A | Discharge status: Home / Self Care | Payer: Medicaid Other | Attending: Emergency Medicine | Admitting: Emergency Medicine

## 2016-08-15 DIAGNOSIS — R0689 Other abnormalities of breathing: Secondary | ICD-10-CM | POA: Insufficient documentation

## 2016-08-15 DIAGNOSIS — Z79899 Other long term (current) drug therapy: Secondary | ICD-10-CM | POA: Diagnosis not present

## 2016-08-15 DIAGNOSIS — R630 Anorexia: Secondary | ICD-10-CM | POA: Diagnosis not present

## 2016-08-15 DIAGNOSIS — M7989 Other specified soft tissue disorders: Secondary | ICD-10-CM | POA: Insufficient documentation

## 2016-08-15 DIAGNOSIS — E039 Hypothyroidism, unspecified: Secondary | ICD-10-CM | POA: Diagnosis not present

## 2016-08-15 LAB — URINALYSIS, ROUTINE W REFLEX MICROSCOPIC
Bacteria, UA: NONE SEEN
Bilirubin Urine: NEGATIVE
Glucose, UA: NEGATIVE mg/dL
Hgb urine dipstick: NEGATIVE
Ketones, ur: 5 mg/dL — AB
Leukocytes, UA: NEGATIVE
Nitrite: NEGATIVE
Protein, ur: 30 mg/dL — AB
Specific Gravity, Urine: 1.027 (ref 1.005–1.030)
pH: 7 (ref 5.0–8.0)

## 2016-08-15 LAB — CBC
HEMATOCRIT: 40.5 % (ref 36.0–46.0)
Hemoglobin: 12.5 g/dL (ref 12.0–15.0)
MCH: 33.1 pg (ref 26.0–34.0)
MCHC: 30.9 g/dL (ref 30.0–36.0)
MCV: 107.1 fL — AB (ref 78.0–100.0)
PLATELETS: 195 10*3/uL (ref 150–400)
RBC: 3.78 MIL/uL — ABNORMAL LOW (ref 3.87–5.11)
RDW: 16.7 % — AB (ref 11.5–15.5)
WBC: 9.5 10*3/uL (ref 4.0–10.5)

## 2016-08-15 LAB — BASIC METABOLIC PANEL
Anion gap: 10 (ref 5–15)
BUN: 26 mg/dL — ABNORMAL HIGH (ref 6–20)
CHLORIDE: 108 mmol/L (ref 101–111)
CO2: 25 mmol/L (ref 22–32)
Calcium: 9.3 mg/dL (ref 8.9–10.3)
Creatinine, Ser: 0.87 mg/dL (ref 0.44–1.00)
GFR calc Af Amer: >60 mL/min (ref >60)
GFR calc non Af Amer: >60 mL/min (ref >60)
Glucose, Bld: 84 mg/dL (ref 65–99)
POTASSIUM: 4.3 mmol/L (ref 3.5–5.1)
Sodium: 143 mmol/L (ref 135–145)

## 2016-08-15 LAB — VALPROIC ACID LEVEL: Valproic Acid Lvl: 107 ug/mL — ABNORMAL HIGH (ref 50.0–100.0)

## 2016-08-15 LAB — BRAIN NATRIURETIC PEPTIDE: B Natriuretic Peptide: 25.1 pg/mL (ref 0.0–100.0)

## 2016-08-15 LAB — I-STAT TROPONIN, ED: Troponin i, poc: 0 ng/mL (ref 0.00–0.08)

## 2016-08-15 LAB — I-STAT CG4 LACTIC ACID, ED: Lactic Acid, Venous: 1.57 mmol/L (ref 0.5–1.9)

## 2016-08-15 LAB — I-STAT BETA HCG BLOOD, ED (MC, WL, AP ONLY)

## 2016-08-15 NOTE — ED Provider Notes (Signed)
[facility]-EMERGENCY DEPT Provider Note   CSN: [id number] Arrival date & time: 08/15/16  0919     History   Chief Complaint Chief Complaint  Patient presents with  . Leg Swelling    HPI Michele [name] is a 31 y.o. female with history of cerebral palsy, profound MR, Lennox-Gastaut syndrome, pneumonia, seizures who presents with mother (caretaker) for decreased appetite, one week of cough and cold symptoms, and leg swelling. Patient is nonverbal at baseline. Patient has had some decreased urination. Patient uses Depends. Patient is mostly wheelchair bound, however does ambulate occasionally. No vomiting or diarrhea.  HPI  Past Medical History:  Diagnosis Date  . Cerebral palsy (HCC)   . Hypothyroidism   . Lennox-Gastaut syndrome (HCC)   . Osteoporosis   . Pneumonia   . Profound mental retardation   . Reflux   . Rickets   . Seizures [facility])     Patient Active Problem List   Diagnosis Date Noted  . Leg edema, left 04/21/2016  . Hypokalemia   . Unsteadiness on feet   . Community acquired pneumonia of right lower lobe of lung (HCC) 04/05/2016  . Hypoxia 04/05/2016  . Productive cough 04/03/2016  . Stye left upper eyelid 11/25/2015  . Intertriginous candidiasis 09/15/2015  . Rhinitis, non-allergic 06/23/2015  . Special screening examination for human papillomavirus (HPV) 07/20/2014  . Hypernatremia 06/02/2014  . Amenorrhea 05/19/2014  . 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 Long-term use of high-risk medication 12/24/2012  . Morbid obesity (HCC) 12/24/2012  . 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 URINARY INCONTINENCE 02/25/2010  . 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 Hypothyroidism 01/29/2009  . 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 Infantile cerebral palsy (HCC) 01/29/2009  . GASTROESOPHAGEAL REFLUX DISEASE 01/29/2009  . Osteoporosis 01/29/2009  . Seizure disorder  (HCC) 01/29/2009    Past Surgical History:  Procedure Laterality Date  . FOOT MASS EXCISION Right   . Gastrocutaneous fistual repair  2012  . PEG TUBE PLACEMENT      OB History    No data available       Home Medications    Prior to Admission medications   Medication Sig Start Date End Date Taking? Authorizing Provider  alendronate (FOSAMAX) 70 MG tablet TAKE 1 TABLET BY MOUTH EVERY 7 DAYS ON SATURDAY WITH A FULL GLASS OF WATER ON AN EMPTY STOMACH 07/03/16  Yes Ashly M Gottschalk, DO  BANZEL 40 MG/ML SUSP TAKE 5 MLS BY MOUTH IN THE MORNING AND 5 MLS BY MOUTH AT NIGHT 06/26/16  Yes Elveria Rising, NP  Calcium Carbonate-Vitamin D (CALCARB 600/D) 600-400 MG-UNIT per tablet Take 1 tablet by mouth 2 (two) times daily. 1 tab per tube 2 time a day   Yes Historical Provider, MD  CARNITOR 1 GM/10ML solution TAKE 5 MLS BY MOUTH THREE TIMES DAILY 06/26/16  Yes Elveria Rising, NP  clotrimazole (LOTRIMIN) 1 % cream APPLY EXTERNALLY TO THE AFFECTED AREA TWICE DAILY 01/12/16  Yes Ashly M Gottschalk, DO  DEPAKOTE SPRINKLES 125 MG capsule TAKE 8 CAPSULES BY MOUTH TWICE DAILY 06/26/16  Yes Elveria Rising, NP  feeding supplement, ENSURE ENLIVE, (ENSURE ENLIVE) LIQD Take 237 mLs by mouth 3 (three) times daily between meals. 04/11/16  Yes Beaulah Dinning, MD  fluticasone (FLONASE) 50 MCG/ACT nasal spray SHAKE LIQUID AND USE 2 SPRAYS IN [facility] NOSTRIL DAILY 01/12/16  Yes Ashly Hulen Skains, DO  folic acid (FOLVITE) 1 MG tablet Take 1  mg by mouth every morning. 1 tab per tube daily   Yes Historical Provider, MD  furosemide (LASIX) 20 MG tablet TAKE 1 TABLET(20 MG) BY MOUTH DAILY AS NEEDED 08/03/16  Yes Ashly M Gottschalk, DO  KEPPRA 100 MG/ML solution TAKE 12 MLS BY MOUTH EVERY MORNING AND 12 MLS EVERY EVENING 06/26/16  Yes Elveria Risingina Goodpasture, NP  lactulose (CHRONULAC) 10 GM/15ML solution TAKE 30 ML BY MOUTH TWICE DAILY 05/03/16  Yes Ardith Darkaleb M Parker, MD  NYSTATIN powder APPLY TO AFFECTED DIAPER AREAS WITH EVERY  DIAPER CHANGE UNTIL RASH CLEARS 01/12/16  Yes Ashly M Gottschalk, DO  ondansetron (ZOFRAN) 4 MG/5ML solution TAKE 5 MLS FOR PAIN EVERY 8 HOURS AS NEEDED FOR NAUSEA OR VOMITING 06/29/14  Yes Bryan R Hess, DO  PROTONIX 40 MG PACK PLACE 10 ML INTO THE FEEDING TUBE DAILY Patient taking differently: 10 mL po qd 05/30/16  Yes Ashly M Gottschalk, DO  TOPAMAX SPRINKLE 25 MG capsule TAKE 6 CAPSULES BY MOUTH EVERY MORNING AND 6 CAPSULES BY MOUTH EVERY EVENING WITH FOOD 07/31/16  Yes Ashly M Gottschalk, DO  DIASTAT ACUDIAL 20 MG GEL DIAL TO 15 MG, GIVE RECTALLY FOR SEIZURES LASTING 2 MINUTES OR LONGER 06/26/16   Elveria Risingina Goodpasture, NP  hydrocortisone cream 0.5 % Apply 1 application topically 2 (two) times daily. Patient taking differently: Apply 1 application topically 2 (two) times daily as needed.  10/06/15   Raliegh IpAshly M Gottschalk, DO    Family History Family History  Problem Relation Age of Onset  . Hypertension Mother   . Heart disease Maternal Grandmother     Died at 7664  . Stroke Maternal Grandfather     died at 4079  . Hypertension Paternal Grandmother   . Diabetes      MGGM  . Heart disease      MGGF  . Kidney disease      Maternal Great Aunt   . Cancer Maternal Aunt     lung  . Colon cancer Neg Hx     Social History Social History  Substance Use Topics  . Smoking status: Never Smoker  . Smokeless tobacco: Never Used  . Alcohol use No     Allergies   Carbamazepine   Review of Systems Review of Systems  Unable to perform ROS: Patient nonverbal  Constitutional: Positive for appetite change.  Respiratory: Positive for cough.      Physical Exam Updated Vital Signs BP 108/71   Pulse 65   Temp (!) 95.9 F (35.5 C) (Rectal)   Resp 13   SpO2 98%   Physical Exam  Constitutional: She appears well-developed and well-nourished. No distress.  HENT:  Head: Normocephalic and atraumatic.  Mouth/Throat: Oropharynx is clear and moist. No oropharyngeal exudate.  Eyes: Conjunctivae are  normal. Pupils are equal, round, and reactive to light. Right eye exhibits no discharge. Left eye exhibits no discharge. No scleral icterus.  Neck: Normal range of motion. Neck supple. No thyromegaly present.  Cardiovascular: Normal rate, regular rhythm, normal heart sounds and intact distal pulses.  Exam reveals no gallop and no friction rub.   No murmur heard. Pulmonary/Chest: Effort normal. No stridor. No respiratory distress. She has decreased breath sounds. She has no wheezes. She has no rales.  Abdominal: Soft. Bowel sounds are normal. She exhibits no distension. There is no tenderness. There is no rebound and no guarding.  Musculoskeletal: She exhibits no edema.  Lymphadenopathy:    She has no cervical adenopathy.  Neurological: She is alert. Coordination normal.  Skin: Skin is warm and dry. No rash noted. She is not diaphoretic. No pallor.  Psychiatric: She has a normal mood and affect.  Nursing note and vitals reviewed.    ED Treatments / Results  Labs (all labs ordered are listed, but only abnormal results are displayed) Labs Reviewed  BASIC METABOLIC PANEL - Abnormal; Notable for the following:       Result Value   BUN 26 (*)    All other components within normal limits  CBC - Abnormal; Notable for the following:    RBC 3.78 (*)    MCV 107.1 (*)    RDW 16.7 (*)    All other components within normal limits  URINALYSIS, ROUTINE W REFLEX MICROSCOPIC - Abnormal; Notable for the following:    Ketones, ur 5 (*)    Protein, ur 30 (*)    Squamous Epithelial / LPF 0-5 (*)    All other components within normal limits  VALPROIC ACID LEVEL - Abnormal; Notable for the following:    Valproic Acid Lvl 107 (*)    All other components within normal limits  BRAIN NATRIURETIC PEPTIDE  I-STAT TROPOININ, ED  I-STAT BETA HCG BLOOD, ED ([facility], [facility], [facility] ONLY)  I-STAT CG4 LACTIC ACID, ED    EKG  EKG Interpretation  Date/Time:  Tuesday August 15 2016 10:04:29 EST Ventricular Rate:   66 PR Interval:    QRS Duration: 99 QT Interval:  507 QTC Calculation: 532 R Axis:   39 Text Interpretation:  Sinus rhythm Prolonged PR interval Consider left atrial enlargement Low voltage, precordial leads Borderline T abnormalities, inferior leads Prolonged QT interval Baseline wander in lead(s) V3 V4 V6 Confirmed by DELO  MD, DOUGLAS ([id number]) on 08/15/2016 11:15:07 AM       Radiology Dg Chest 2 View  Result Date: 08/15/2016 CLINICAL DATA:  Cough, congestion, increasing leg swelling EXAM: CHEST  2 VIEW COMPARISON:  04/12/2016 FINDINGS: Cardiomegaly again noted. No infiltrate or pleural effusion. No pulmonary edema. Mild basilar atelectasis. Mild dextroscoliosis lower thoracic spine. IMPRESSION: Stable cardiomegaly. Mild basilar atelectasis. No infiltrate or pulmonary edema. Electronically Signed   By: Natasha Mead M.D.   On: 08/15/2016 10:59    Procedures Procedures (including critical care time)  Medications Ordered in ED Medications - No data to display   Initial Impression / Assessment and Plan / ED Course  I have reviewed the triage vital signs and the nursing notes.  Pertinent labs & imaging results that were available during my care of the patient were reviewed by me and considered in my medical decision making (see chart for details).     Patient well-appearing. CBC unremarkable. BMP shows BUN 26. Troponin negative. BNP 25.1. Lactate 1.57. UA shows 5 ketones, 30 protein. Valproic acid level 107. CXR shows stable cardiomegaly, mild basilar atelectasis, no infiltrate or pulmonary edema. EKG shows NSR, unchanged since last visit. Patient evaluated by Dr. Judd Lien who recommends patient discharge home with Lasix daily for 1 week withollow-up to PCP. Strict return precautions given. Mother understands and agrees with plan. Patient discharged in satisfactory condition. Patient also evaluated by Dr. Judd Lien who guided the patient's management and agrees with plan.  Final Clinical  Impressions(s) / ED Diagnoses   Final diagnoses:  Leg swelling  Decreased appetite    New Prescriptions Discharge Medication List as of 08/15/2016  1:26 PM       Emi Holes, PA-C 08/15/16 1340    Geoffery Lyons, MD 08/15/16 1352

## 2016-08-15 NOTE — Telephone Encounter (Signed)
Patient brought into clinic by mom for triage.  Mom stated that patient's legs have increase swelling, decrease appetite and SOB.  Per mom; last night patient only urinated once, unsure about during the day since patient is in a day program.  Normally patient urinate a lot due to Lasix.  Mom is giving the Lasix everyday or every other day.  Mom also reported increase in falling. Vitals taken BP manually right arm 100/70, HR 100 and 96% room air.  Precept with Dr. Nadine CountsGottschalk; patient should be seen in ED for further evaluation; lab work and possible X-ray due to patient having previous symptoms before and developed Pneumonia.  Mom agreed to take patient to ED and will follow up with PCP.  Clovis PuMartin, Tamika L, RN

## 2016-08-15 NOTE — ED Notes (Signed)
Patient transported to X-ray 

## 2016-08-15 NOTE — ED Triage Notes (Signed)
Pt from home with recommendation from PCP to come to the ED due to increased leg swelling.  Pt eating sour cream and onion chips given to by mother to keep pt still.  This RN advised mother that chips have a large amount of salt which will increase water retention and swelling.  NAD, A&O.

## 2016-08-15 NOTE — Discharge Instructions (Signed)
Begin taking Lasix daily for 1 week. Please follow-up with primary care provider in 2 days for recheck of symptoms. Please return to emergency department if you developed any new or worsening symptoms.

## 2016-08-16 ENCOUNTER — Encounter (HOSPITAL_COMMUNITY): Payer: Self-pay | Admitting: *Deleted

## 2016-08-16 ENCOUNTER — Inpatient Hospital Stay (HOSPITAL_COMMUNITY)
Admission: EM | Admit: 2016-08-16 | Discharge: 2016-08-18 | DRG: 948 | Disposition: A | Discharge status: Home / Self Care | Payer: Medicaid Other | Attending: Family Medicine | Admitting: Family Medicine

## 2016-08-16 ENCOUNTER — Ambulatory Visit: Payer: Medicaid Other | Admitting: Student

## 2016-08-16 DIAGNOSIS — G40812 Lennox-Gastaut syndrome, not intractable, without status epilepticus: Secondary | ICD-10-CM | POA: Diagnosis present

## 2016-08-16 DIAGNOSIS — G808 Other cerebral palsy: Secondary | ICD-10-CM | POA: Diagnosis present

## 2016-08-16 DIAGNOSIS — F73 Profound intellectual disabilities: Secondary | ICD-10-CM | POA: Diagnosis present

## 2016-08-16 DIAGNOSIS — E039 Hypothyroidism, unspecified: Secondary | ICD-10-CM | POA: Diagnosis present

## 2016-08-16 DIAGNOSIS — Z79899 Other long term (current) drug therapy: Secondary | ICD-10-CM

## 2016-08-16 DIAGNOSIS — T68XXXA Hypothermia, initial encounter: Secondary | ICD-10-CM

## 2016-08-16 DIAGNOSIS — Z7983 Long term (current) use of bisphosphonates: Secondary | ICD-10-CM

## 2016-08-16 DIAGNOSIS — Z6839 Body mass index (BMI) 39.0-39.9, adult: Secondary | ICD-10-CM

## 2016-08-16 DIAGNOSIS — G40409 Other generalized epilepsy and epileptic syndromes, not intractable, without status epilepticus: Secondary | ICD-10-CM | POA: Diagnosis present

## 2016-08-16 DIAGNOSIS — R600 Localized edema: Secondary | ICD-10-CM | POA: Diagnosis present

## 2016-08-16 DIAGNOSIS — K5900 Constipation, unspecified: Secondary | ICD-10-CM | POA: Diagnosis present

## 2016-08-16 DIAGNOSIS — M818 Other osteoporosis without current pathological fracture: Secondary | ICD-10-CM | POA: Diagnosis present

## 2016-08-16 DIAGNOSIS — K219 Gastro-esophageal reflux disease without esophagitis: Secondary | ICD-10-CM | POA: Diagnosis present

## 2016-08-16 DIAGNOSIS — I959 Hypotension, unspecified: Secondary | ICD-10-CM | POA: Diagnosis present

## 2016-08-16 DIAGNOSIS — R4182 Altered mental status, unspecified: Secondary | ICD-10-CM | POA: Diagnosis present

## 2016-08-16 DIAGNOSIS — K5909 Other constipation: Secondary | ICD-10-CM

## 2016-08-16 DIAGNOSIS — Z8249 Family history of ischemic heart disease and other diseases of the circulatory system: Secondary | ICD-10-CM

## 2016-08-16 DIAGNOSIS — R680 Hypothermia, not associated with low environmental temperature: Principal | ICD-10-CM | POA: Diagnosis present

## 2016-08-16 LAB — CBC WITH DIFFERENTIAL/PLATELET
BASOS ABS: 0 10*3/uL (ref 0.0–0.1)
BASOS PCT: 0 %
EOS ABS: 0.1 10*3/uL (ref 0.0–0.7)
EOS PCT: 1 %
HEMATOCRIT: 37.4 % (ref 36.0–46.0)
Hemoglobin: 11.9 g/dL — ABNORMAL LOW (ref 12.0–15.0)
Lymphocytes Relative: 38 %
Lymphs Abs: 3.7 10*3/uL (ref 0.7–4.0)
MCH: 32.9 pg (ref 26.0–34.0)
MCHC: 31.8 g/dL (ref 30.0–36.0)
MCV: 103.3 fL — ABNORMAL HIGH (ref 78.0–100.0)
MONO ABS: 0.9 10*3/uL (ref 0.1–1.0)
MONOS PCT: 9 %
Neutro Abs: 5 10*3/uL (ref 1.7–7.7)
Neutrophils Relative %: 52 %
PLATELETS: 218 10*3/uL (ref 150–400)
RBC: 3.62 MIL/uL — ABNORMAL LOW (ref 3.87–5.11)
RDW: 16.6 % — AB (ref 11.5–15.5)
WBC: 9.6 10*3/uL (ref 4.0–10.5)

## 2016-08-16 LAB — INFLUENZA PANEL BY PCR (TYPE A & B)
INFLBPCR: NEGATIVE
Influenza A By PCR: NEGATIVE

## 2016-08-16 LAB — T4, FREE: FREE T4: 0.76 ng/dL (ref 0.61–1.12)

## 2016-08-16 LAB — COMPREHENSIVE METABOLIC PANEL
ALT: 14 U/L (ref 14–54)
ANION GAP: 6 (ref 5–15)
AST: 20 U/L (ref 15–41)
Albumin: 3.2 g/dL — ABNORMAL LOW (ref 3.5–5.0)
Alkaline Phosphatase: 114 U/L (ref 38–126)
BILIRUBIN TOTAL: 0.3 mg/dL (ref 0.3–1.2)
BUN: 22 mg/dL — ABNORMAL HIGH (ref 6–20)
CHLORIDE: 108 mmol/L (ref 101–111)
CO2: 27 mmol/L (ref 22–32)
Calcium: 9 mg/dL (ref 8.9–10.3)
Creatinine, Ser: 0.77 mg/dL (ref 0.44–1.00)
GFR calc Af Amer: >60 mL/min (ref >60)
GFR calc non Af Amer: >60 mL/min (ref >60)
Glucose, Bld: 72 mg/dL (ref 65–99)
POTASSIUM: 4 mmol/L (ref 3.5–5.1)
SODIUM: 141 mmol/L (ref 135–145)
TOTAL PROTEIN: 7.5 g/dL (ref 6.5–8.1)

## 2016-08-16 LAB — LIPASE, BLOOD: Lipase: 31 U/L (ref 11–51)

## 2016-08-16 LAB — I-STAT CG4 LACTIC ACID, ED
LACTIC ACID, VENOUS: 1.32 mmol/L (ref 0.5–1.9)
Lactic Acid, Venous: 0.97 mmol/L (ref 0.5–1.9)

## 2016-08-16 LAB — AMMONIA: Ammonia: 46 umol/L — ABNORMAL HIGH (ref 9–35)

## 2016-08-16 LAB — TSH: TSH: 3.281 u[IU]/mL (ref 0.350–4.500)

## 2016-08-16 MED ORDER — SODIUM CHLORIDE 0.9 % IV BOLUS (SEPSIS)
1000.0000 mL | Freq: Once | INTRAVENOUS | Status: AC
  Administered 2016-08-17: 1000 mL via INTRAVENOUS

## 2016-08-16 MED ORDER — VANCOMYCIN HCL IN DEXTROSE 1-5 GM/200ML-% IV SOLN
1000.0000 mg | Freq: Once | INTRAVENOUS | Status: AC
  Administered 2016-08-17: 1000 mg via INTRAVENOUS
  Filled 2016-08-16: qty 200

## 2016-08-16 MED ORDER — PIPERACILLIN-TAZOBACTAM 3.375 G IVPB
3.3750 g | Freq: Once | INTRAVENOUS | Status: AC
  Administered 2016-08-17: 3.375 g via INTRAVENOUS
  Filled 2016-08-16: qty 50

## 2016-08-16 NOTE — ED Triage Notes (Signed)
[facility] for [facility]  Mother feels concern that pt is not eating well and reports that "something is bothering her, I don't know what it could be".  Pt appears well nourished and in no distress, when I note this to mother she reports that pt has also had some BLE swelling and her stools have been foul smelling.

## 2016-08-16 NOTE — ED Provider Notes (Signed)
[facility]-EMERGENCY DEPT Provider Note   CSN: [id number] Arrival date & time: 08/16/16  1751     History   Chief Complaint Chief Complaint  Patient presents with  . Follow-up    HPI Michele [name] is a 31 y.o. female.  HPI   Presents with concern for change from baseline behavior, hypothermia, sleeplessness, bilateral lower extremity edema and decreased appetite. Mom reports that typically she is up" getting into everything", however her energy has been decreased over the last 2 days. Reports they were evaluated at [facility] yesterday and discharged with instruction to take her when necessary Lasix every day for the next 5 days and follow-up with her primary care doctor. Mom reports she is worried as her daughter has continued to exhibit change in behavior from her baseline. Reports that last night she was up all night, not sleeping, which is unlike her. Reports that she's had normal stools, however they've been foul-smelling. Reports she's had decreased appetite. Denies shortness of breath, cough, vomiting or diarrhea. Reports that she had some cough and nasal congestion last week, with cough resolving however some mild nasal congestion continuing. No known sick contacts, however she had gone to a day center where she might of been exposed to a viral infections. Denies any other recent changes in her medications. Notes that when she checks her rectal temperature at home and it was 95 which is low for her (typically she is 73.)  Past Medical History:  Diagnosis Date  . Cerebral palsy (HCC)   . Hypothyroidism   . Lennox-Gastaut syndrome (HCC)   . Osteoporosis   . Pneumonia   . Profound mental retardation   . Reflux   . Rickets   . Seizures [facility])     Patient Active Problem List   Diagnosis Date Noted  . Hypothermia 08/17/2016  . Leg edema, left 04/21/2016  . Hypokalemia   . Unsteadiness on feet   . Community acquired pneumonia of right lower lobe of lung (HCC) 04/05/2016  . Hypoxia  04/05/2016  . Productive cough 04/03/2016  . Stye left upper eyelid 11/25/2015  . Intertriginous candidiasis 09/15/2015  . Rhinitis, non-allergic 06/23/2015  . Special screening examination for human papillomavirus (HPV) 07/20/2014  . Hypernatremia 06/02/2014  . Amenorrhea 05/19/2014  . 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 Long-term use of high-risk medication 12/24/2012  . Morbid obesity (HCC) 12/24/2012  . 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 URINARY INCONTINENCE 02/25/2010  . 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 Hypothyroidism 01/29/2009  . 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 Infantile cerebral palsy (HCC) 01/29/2009  . GASTROESOPHAGEAL REFLUX DISEASE 01/29/2009  . Osteoporosis 01/29/2009  . Seizure disorder (HCC) 01/29/2009    Past Surgical History:  Procedure Laterality Date  . FOOT MASS EXCISION Right   . Gastrocutaneous fistual repair  2012  . PEG TUBE PLACEMENT      OB History    No data available       Home Medications    Prior to Admission medications   Medication Sig Start Date End Date Taking? Authorizing Provider  alendronate (FOSAMAX) 70 MG tablet TAKE 1 TABLET BY MOUTH EVERY 7 DAYS ON SATURDAY WITH A FULL GLASS OF WATER ON AN EMPTY STOMACH 07/03/16  Yes Ashly M Gottschalk, DO  BANZEL 40 MG/ML SUSP TAKE 5 MLS BY MOUTH IN THE MORNING AND 5 MLS BY MOUTH AT NIGHT 06/26/16  Yes Elveria Rising, NP  Calcium Carbonate-Vitamin  D (CALCARB 600/D) 600-400 MG-UNIT per tablet Take 1 tablet by mouth 2 (two) times daily. 1 tab per tube 2 time a day   Yes Historical Provider, MD  CARNITOR 1 GM/10ML solution TAKE 5 MLS BY MOUTH THREE TIMES DAILY 06/26/16  Yes Elveria Risingina Goodpasture, NP  DEPAKOTE SPRINKLES 125 MG capsule TAKE 8 CAPSULES BY MOUTH TWICE DAILY 06/26/16  Yes Elveria Risingina Goodpasture, NP  feeding supplement, ENSURE ENLIVE, (ENSURE ENLIVE) LIQD Take 237 mLs by mouth 3  (three) times daily between meals. 04/11/16  Yes Beaulah Dinninghristina M Gambino, MD  fluticasone (FLONASE) 50 MCG/ACT nasal spray SHAKE LIQUID AND [facility] 01/12/16  Yes Ashly M Gottschalk, DO  folic acid (FOLVITE) 1 MG tablet Take 1 mg by mouth every morning. 1 tab per tube daily   Yes Historical Provider, MD  furosemide (LASIX) 20 MG tablet TAKE 1 TABLET(20 MG) BY MOUTH DAILY AS NEEDED Patient taking differently: TAKE 1 TABLET(20 MG) BY MOUTH DAILY AS NEEDED FLUID RETENTION 08/03/16  Yes Ashly M Gottschalk, DO  KEPPRA 100 MG/ML solution TAKE 12 MLS BY MOUTH EVERY MORNING AND 12 MLS EVERY EVENING 06/26/16  Yes Elveria Risingina Goodpasture, NP  lactulose (CHRONULAC) 10 GM/15ML solution TAKE 30 ML BY MOUTH TWICE DAILY 05/03/16  Yes Ardith Darkaleb M Parker, MD  PROTONIX 40 MG PACK PLACE 10 ML INTO THE FEEDING TUBE DAILY 05/30/16  Yes Ashly M Gottschalk, DO  TOPAMAX SPRINKLE 25 MG capsule TAKE 6 CAPSULES BY MOUTH EVERY MORNING AND 6 CAPSULES BY MOUTH EVERY EVENING WITH FOOD 07/31/16  Yes Ashly Hulen SkainsM Gottschalk, DO  clotrimazole (LOTRIMIN) 1 % cream APPLY EXTERNALLY TO THE AFFECTED AREA TWICE DAILY 01/12/16   Ashly M Gottschalk, DO  DIASTAT ACUDIAL 20 MG GEL DIAL TO 15 MG, GIVE RECTALLY FOR SEIZURES LASTING 2 MINUTES OR LONGER 06/26/16   Elveria Risingina Goodpasture, NP  hydrocortisone cream 0.5 % Apply 1 application topically 2 (two) times daily. 10/06/15   Ashly Hulen SkainsM Gottschalk, DO  NYSTATIN powder APPLY TO AFFECTED DIAPER AREAS WITH EVERY DIAPER CHANGE UNTIL RASH CLEARS 01/12/16   Raliegh IpAshly M Gottschalk, DO  ondansetron (ZOFRAN) 4 MG/5ML solution TAKE 5 MLS FOR PAIN EVERY 8 HOURS AS NEEDED FOR NAUSEA OR VOMITING 06/29/14   Briscoe DeutscherBryan R Hess, DO    Family History Family History  Problem Relation Age of Onset  . Hypertension Mother   . Heart disease Maternal Grandmother     Died at 4164  . Stroke Maternal Grandfather     died at 7179  . Hypertension Paternal Grandmother   . Diabetes      MGGM  . Heart disease      MGGF  . Kidney disease       Maternal Great Aunt   . Cancer Maternal Aunt     lung  . Colon cancer Neg Hx     Social History Social History  Substance Use Topics  . Smoking status: Never Smoker  . Smokeless tobacco: Never Used  . Alcohol use No     Allergies   Carbamazepine   Review of Systems Review of Systems  Constitutional: Positive for appetite change and fatigue. Negative for fever (hypothermia).  HENT: Positive for congestion.   Respiratory: Negative for cough and shortness of breath.   Cardiovascular: Positive for leg swelling.  Gastrointestinal: Negative for abdominal pain, diarrhea, nausea and vomiting.  Genitourinary: Negative for difficulty urinating.  Musculoskeletal: Positive for joint swelling.  Skin: Negative for rash.  Neurological: Negative for seizures and syncope.  Physical Exam Updated Vital Signs BP 132/78 (BP Location: Right Arm)   Pulse 102   Temp 99.1 F (37.3 C) (Rectal)   Resp 21   Ht 4\' 11"  (1.499 m)   Wt 198 lb (89.8 kg)   SpO2 95%   BMI 39.99 kg/m   Physical Exam  Constitutional: She appears well-developed and well-nourished. No distress.  Short build, sitting in wheelchair  HENT:  Head: Normocephalic and atraumatic.  Eyes: Conjunctivae and EOM are normal.  Neck: Normal range of motion.  Cardiovascular: Normal rate, regular rhythm, normal heart sounds and intact distal pulses.  Exam reveals no gallop and no friction rub.   No murmur heard. Pulmonary/Chest: Effort normal and breath sounds normal. No respiratory distress. She has no wheezes. She has no rales.  Abdominal: Soft. She exhibits no distension. There is no tenderness. There is no guarding.  Musculoskeletal: She exhibits no edema or tenderness.  Mild swelling bilateral hands and feet   Neurological: She is alert.  Alert, active, clapping hands, moving all 4 extremities, looking around room  Skin: Skin is warm and dry. No rash noted. She is not diaphoretic. No erythema.  Nursing note and  vitals reviewed.    ED Treatments / Results  Labs (all labs ordered are listed, but only abnormal results are displayed) Labs Reviewed  CBC WITH DIFFERENTIAL/PLATELET - Abnormal; Notable for the following:       Result Value   RBC 3.62 (*)    Hemoglobin 11.9 (*)    MCV 103.3 (*)    RDW 16.6 (*)    All other components within normal limits  COMPREHENSIVE METABOLIC PANEL - Abnormal; Notable for the following:    BUN 22 (*)    Albumin 3.2 (*)    All other components within normal limits  AMMONIA - Abnormal; Notable for the following:    Ammonia 46 (*)    All other components within normal limits  URINALYSIS, ROUTINE W REFLEX MICROSCOPIC - Abnormal; Notable for the following:    Ketones, ur 5 (*)    Protein, ur 30 (*)    Squamous Epithelial / LPF 0-5 (*)    All other components within normal limits  CULTURE, BLOOD (ROUTINE X 2)  CULTURE, BLOOD (ROUTINE X 2)  URINE CULTURE  LIPASE, BLOOD  TSH  T4, FREE  INFLUENZA PANEL BY PCR (TYPE A & B)  I-STAT CG4 LACTIC ACID, ED  I-STAT CG4 LACTIC ACID, ED    EKG  EKG Interpretation None       Radiology Dg Chest 2 View  Result Date: 08/15/2016 CLINICAL DATA:  Cough, congestion, increasing leg swelling EXAM: CHEST  2 VIEW COMPARISON:  04/12/2016 FINDINGS: Cardiomegaly again noted. No infiltrate or pleural effusion. No pulmonary edema. Mild basilar atelectasis. Mild dextroscoliosis lower thoracic spine. IMPRESSION: Stable cardiomegaly. Mild basilar atelectasis. No infiltrate or pulmonary edema. Electronically Signed   By: Natasha Mead M.D.   On: 08/15/2016 10:59    Procedures Procedures (including critical care time)  Medications Ordered in ED Medications  vancomycin (VANCOCIN) IVPB 1000 mg/200 mL premix (1,000 mg Intravenous New Bag/Given 08/17/16 0214)  sodium chloride 0.9 % bolus 1,000 mL (0 mLs Intravenous Stopped 08/17/16 0208)  sodium chloride 0.9 % bolus 1,000 mL (0 mLs Intravenous Stopped 08/17/16 0208)  piperacillin-tazobactam  (ZOSYN) IVPB 3.375 g (0 g Intravenous Stopped 08/17/16 0208)  sodium chloride 0.9 % bolus 1,000 mL (1,000 mLs Intravenous New Bag/Given 08/17/16 0214)     Initial Impression / Assessment and Plan /  ED Course  I have reviewed the triage vital signs and the nursing notes.  Pertinent labs & imaging results that were available during my care of the patient were reviewed by me and considered in my medical decision making (see chart for details).    31 year old female with history of Lennox-Gastaut syndrome with seizures, mental delay, hypothyroidism who presents with concern for change in baseline behaviors, decreased appetite, concern for hypothermia at home. Patient was seen yesterday, and had an evaluation including urinalysis, chest x-ray which showed no sign of infection, lab work which was within normal limits, normal lactic acid, no leukocytosis, no electrolyte abnormality, normal BNP, and was recommended to take Lasix at home for leg swelling and follow up with her PCP. Today, they present with continuing symptoms.    Patient is well-appearing and playful on exam, however mom reports that she is not as energetic as her baseline. Rectal temperature was performed which was 95.9. Differential diagnosis for hypothermia included sepsis, hypothyroidism or environmental. There are no signs of bacterial infection on evaluation yesterday, and I don't see signs of bacterial infection on exam, including no sign of cellulitis.    We'll plan on reevaluating patient's laboratory values today, external warming, and adding on additional labs such as an ammonia level in setting of Depakote use and mild elevation in depakote yesterday, TSH and hepatic panel.  Will also check influenza. Do not feel code sepsis is indicated at this time in absence of source or suspicion for infection.  Labwork again shows normal lactic acid, no significant electrolyte abnormalities, no leukocytosis.  Depakote level with mild elevation  yesterday 107, ammonia with mild elevation of 47. Patient with similar symptoms of AMS during admission in October 2015 with similar Depakote and ammonia levels, (however concern also at that time for urinary tract infection-not seen on UA but noted on culture.) Would consider mild Depakote toxicity as cause of her decreased appetite, fatigue, however this does not explain her hypothermia.    Regarding her hypothermia, unclear etiology at this time, however given normal TSH, slower return to normal temp, would consider sepsis as possibility at this time.  Blood pressures decreased from time of arrival, with mom reporting her normal BP in 90s systolic, however office notes report primarily blood pressures in the 100s, one reading 99 systolic.   At time of decreased BP to 90s systolic, pt given 3L NS, given vanc/zosyn.  Continue to have overall low suspicion for sepsis in setting of normal lactic acid, no WBC, no identifiable source of infection, and patient well appearing on arrival to ED, however will admit for continued observation.  Will not repeat CXR given it was normal yesterday and pt receiving empiric abx.   Patient to be admitted to family medicine stepdown unit.  Her temperatures have improved to 99.1 rectally with external warming and this has been discontinued. Her BP remain stable and improved to 130 systolic when pt awake.     Final Clinical Impressions(s) / ED Diagnoses   Final diagnoses:  Hypothermia, initial encounter  Altered mental status, unspecified altered mental status type    New Prescriptions New Prescriptions   No medications on file     Alvira Monday, MD 08/17/16 [phone]

## 2016-08-17 ENCOUNTER — Encounter (HOSPITAL_COMMUNITY): Payer: Self-pay | Admitting: Emergency Medicine

## 2016-08-17 DIAGNOSIS — Z8249 Family history of ischemic heart disease and other diseases of the circulatory system: Secondary | ICD-10-CM | POA: Diagnosis not present

## 2016-08-17 DIAGNOSIS — G808 Other cerebral palsy: Secondary | ICD-10-CM | POA: Diagnosis present

## 2016-08-17 DIAGNOSIS — R4182 Altered mental status, unspecified: Secondary | ICD-10-CM

## 2016-08-17 DIAGNOSIS — Z79899 Other long term (current) drug therapy: Secondary | ICD-10-CM | POA: Diagnosis not present

## 2016-08-17 DIAGNOSIS — R680 Hypothermia, not associated with low environmental temperature: Secondary | ICD-10-CM | POA: Diagnosis not present

## 2016-08-17 DIAGNOSIS — Z6839 Body mass index (BMI) 39.0-39.9, adult: Secondary | ICD-10-CM | POA: Diagnosis not present

## 2016-08-17 DIAGNOSIS — I959 Hypotension, unspecified: Secondary | ICD-10-CM | POA: Diagnosis present

## 2016-08-17 DIAGNOSIS — K219 Gastro-esophageal reflux disease without esophagitis: Secondary | ICD-10-CM | POA: Diagnosis present

## 2016-08-17 DIAGNOSIS — Z7983 Long term (current) use of bisphosphonates: Secondary | ICD-10-CM | POA: Diagnosis not present

## 2016-08-17 DIAGNOSIS — R600 Localized edema: Secondary | ICD-10-CM | POA: Diagnosis present

## 2016-08-17 DIAGNOSIS — T68XXXD Hypothermia, subsequent encounter: Secondary | ICD-10-CM | POA: Diagnosis not present

## 2016-08-17 DIAGNOSIS — G40812 Lennox-Gastaut syndrome, not intractable, without status epilepticus: Secondary | ICD-10-CM | POA: Diagnosis present

## 2016-08-17 DIAGNOSIS — K5909 Other constipation: Secondary | ICD-10-CM

## 2016-08-17 DIAGNOSIS — E039 Hypothyroidism, unspecified: Secondary | ICD-10-CM | POA: Diagnosis present

## 2016-08-17 DIAGNOSIS — T68XXXA Hypothermia, initial encounter: Secondary | ICD-10-CM | POA: Diagnosis not present

## 2016-08-17 DIAGNOSIS — K5900 Constipation, unspecified: Secondary | ICD-10-CM | POA: Diagnosis present

## 2016-08-17 DIAGNOSIS — M818 Other osteoporosis without current pathological fracture: Secondary | ICD-10-CM | POA: Diagnosis present

## 2016-08-17 DIAGNOSIS — F73 Profound intellectual disabilities: Secondary | ICD-10-CM | POA: Diagnosis present

## 2016-08-17 DIAGNOSIS — G40409 Other generalized epilepsy and epileptic syndromes, not intractable, without status epilepticus: Secondary | ICD-10-CM | POA: Diagnosis present

## 2016-08-17 LAB — CBC
HEMATOCRIT: 35.3 % — AB (ref 36.0–46.0)
HEMOGLOBIN: 10.7 g/dL — AB (ref 12.0–15.0)
MCH: 32.5 pg (ref 26.0–34.0)
MCHC: 30.3 g/dL (ref 30.0–36.0)
MCV: 107.3 fL — AB (ref 78.0–100.0)
Platelets: 203 10*3/uL (ref 150–400)
RBC: 3.29 MIL/uL — AB (ref 3.87–5.11)
RDW: 16.8 % — ABNORMAL HIGH (ref 11.5–15.5)
WBC: 9.1 10*3/uL (ref 4.0–10.5)

## 2016-08-17 LAB — VITAMIN B12: Vitamin B-12: 180 pg/mL (ref 180–914)

## 2016-08-17 LAB — URINALYSIS, ROUTINE W REFLEX MICROSCOPIC
Bacteria, UA: NONE SEEN
Bilirubin Urine: NEGATIVE
GLUCOSE, UA: NEGATIVE mg/dL
Hgb urine dipstick: NEGATIVE
Ketones, ur: 5 mg/dL — AB
Leukocytes, UA: NEGATIVE
Nitrite: NEGATIVE
PROTEIN: 30 mg/dL — AB
Specific Gravity, Urine: 1.027 (ref 1.005–1.030)
pH: 7 (ref 5.0–8.0)

## 2016-08-17 LAB — BASIC METABOLIC PANEL
Anion gap: 5 (ref 5–15)
BUN: 14 mg/dL (ref 6–20)
CHLORIDE: 113 mmol/L — AB (ref 101–111)
CO2: 22 mmol/L (ref 22–32)
Calcium: 7.7 mg/dL — ABNORMAL LOW (ref 8.9–10.3)
Creatinine, Ser: 0.8 mg/dL (ref 0.44–1.00)
GFR calc Af Amer: >60 mL/min (ref >60)
GFR calc non Af Amer: >60 mL/min (ref >60)
GLUCOSE: 86 mg/dL (ref 65–99)
POTASSIUM: 4.1 mmol/L (ref 3.5–5.1)
Sodium: 140 mmol/L (ref 135–145)

## 2016-08-17 LAB — MRSA PCR SCREENING: MRSA by PCR: NEGATIVE

## 2016-08-17 MED ORDER — LEVOCARNITINE 1 GM/10ML PO SOLN
500.0000 mg | Freq: Three times a day (TID) | ORAL | Status: DC
  Administered 2016-08-17 – 2016-08-18 (×4): 500 mg via ORAL
  Filled 2016-08-17 (×6): qty 5

## 2016-08-17 MED ORDER — SODIUM CHLORIDE 0.9% FLUSH
3.0000 mL | Freq: Two times a day (BID) | INTRAVENOUS | Status: DC
  Administered 2016-08-17 – 2016-08-18 (×3): 3 mL via INTRAVENOUS

## 2016-08-17 MED ORDER — LACTULOSE 10 GM/15ML PO SOLN
20.0000 g | Freq: Two times a day (BID) | ORAL | Status: DC
  Administered 2016-08-17 – 2016-08-18 (×3): 20 g via ORAL
  Filled 2016-08-17 (×3): qty 30

## 2016-08-17 MED ORDER — TOPIRAMATE 25 MG PO CPSP
150.0000 mg | ORAL_CAPSULE | Freq: Two times a day (BID) | ORAL | Status: DC
  Administered 2016-08-17 – 2016-08-18 (×3): 150 mg via ORAL
  Filled 2016-08-17 (×3): qty 6

## 2016-08-17 MED ORDER — FLUTICASONE PROPIONATE 50 MCG/ACT NA SUSP
2.0000 | Freq: Every day | NASAL | Status: DC
  Filled 2016-08-17: qty 16

## 2016-08-17 MED ORDER — SODIUM CHLORIDE 0.9 % IV BOLUS (SEPSIS)
500.0000 mL | Freq: Once | INTRAVENOUS | Status: DC
  Administered 2016-08-17: 500 mL via INTRAVENOUS

## 2016-08-17 MED ORDER — TOPIRAMATE 100 MG PO TABS
150.0000 mg | ORAL_TABLET | Freq: Two times a day (BID) | ORAL | Status: DC
  Filled 2016-08-17 (×3): qty 2

## 2016-08-17 MED ORDER — VANCOMYCIN HCL IN DEXTROSE 1-5 GM/200ML-% IV SOLN
1000.0000 mg | Freq: Three times a day (TID) | INTRAVENOUS | Status: DC
  Filled 2016-08-17: qty 200

## 2016-08-17 MED ORDER — FOLIC ACID 1 MG PO TABS
1.0000 mg | ORAL_TABLET | Freq: Every morning | ORAL | Status: DC
  Administered 2016-08-17 – 2016-08-18 (×2): 1 mg via ORAL
  Filled 2016-08-17 (×2): qty 1

## 2016-08-17 MED ORDER — LEVETIRACETAM 100 MG/ML PO SOLN
1200.0000 mg | Freq: Two times a day (BID) | ORAL | Status: DC
  Administered 2016-08-17 – 2016-08-18 (×3): 1200 mg via ORAL
  Filled 2016-08-17 (×3): qty 15

## 2016-08-17 MED ORDER — RUFINAMIDE 40 MG/ML PO SUSP
200.0000 mg | Freq: Two times a day (BID) | ORAL | Status: DC
  Administered 2016-08-17 – 2016-08-18 (×3): 200 mg via ORAL
  Filled 2016-08-17 (×4): qty 5

## 2016-08-17 MED ORDER — DIVALPROEX SODIUM 125 MG PO CSDR
1000.0000 mg | DELAYED_RELEASE_CAPSULE | Freq: Two times a day (BID) | ORAL | Status: DC
  Administered 2016-08-17 – 2016-08-18 (×3): 1000 mg via ORAL
  Filled 2016-08-17 (×3): qty 8

## 2016-08-17 MED ORDER — PANTOPRAZOLE SODIUM 40 MG PO PACK
20.0000 mg | PACK | Freq: Every day | ORAL | Status: DC
  Administered 2016-08-17 – 2016-08-18 (×2): 20 mg via ORAL
  Filled 2016-08-17 (×2): qty 20

## 2016-08-17 MED ORDER — CALCIUM CARBONATE-VITAMIN D 500-200 MG-UNIT PO TABS
1.0000 | ORAL_TABLET | Freq: Two times a day (BID) | ORAL | Status: DC
  Administered 2016-08-17 – 2016-08-18 (×2): 1 via ORAL
  Filled 2016-08-17 (×2): qty 1

## 2016-08-17 MED ORDER — SODIUM CHLORIDE 0.9 % IV BOLUS (SEPSIS)
1000.0000 mL | Freq: Once | INTRAVENOUS | Status: AC
  Administered 2016-08-17: 1000 mL via INTRAVENOUS

## 2016-08-17 MED ORDER — TOPIRAMATE 25 MG PO TABS
150.0000 mg | ORAL_TABLET | Freq: Two times a day (BID) | ORAL | Status: DC
  Filled 2016-08-17: qty 1.5
  Filled 2016-08-17: qty 6

## 2016-08-17 MED ORDER — ENOXAPARIN SODIUM 40 MG/0.4ML ~~LOC~~ SOLN
40.0000 mg | SUBCUTANEOUS | Status: DC
  Administered 2016-08-17 – 2016-08-18 (×2): 40 mg via SUBCUTANEOUS
  Filled 2016-08-17 (×2): qty 0.4

## 2016-08-17 NOTE — H&P (Signed)
[facility] Admission History and Physical Service Pager: [phone]  Patient name: Michele [name] Medical record number: [id number] Date of birth: [birth year] Age: 31 y.o. Gender: female  Primary Care Provider: Delynn Flavin, DO Consultants: None  Code Status: Full code  Chief Complaint: altered mental status  Assessment and Plan: Michele [name] is a 32 y.o. female reportedly with change in behavior from her baseline, hypothermia, sleeplessness, bilateral lower extremity edema and decreased appetite.   Hypothermia/behavioral changes: Rectal temp to 26F at home, along with decreased energy, appetite and swelling in legs worse than usual and temp to 61F in ED and was hypotensive to 94/38.  Patient goes to day center and could have had sick contacts. No leukocytosis. UA showed 5 ketones and some protein and was otherwise normal. Had negative flu test two days ago. Valproic acid slightly elevated to 107 (therapeutic level 50-100). CXR on 2/27 without infiltrate on read. Received vanc/zosyn and 3L IVF in ED. Mental status and blood pressure improved with fluids. Patient back to baseline per mother. Appears very well on exam. Her tachycardia on monitor more likely due to her movements than real tachycardia. Due to her well appearance, will hold antibiotics for now as we do not have a possible/clear source. Monitor closely.  - admit to SDU under attending Jennette Kettle > consider transferring to floor if she continues to do well during the day  - holding antibiotics  - blood and urine cultures - AM BMP/CBC - vitals per floor protocol - consider CXR if resp status worsens  Infantile cerebral palsy/intellectual disability:  completely dependent upon mother for ADLs. Lower extremity edema, non-pitting. Lasix qday or every other day.  - OOB with assistance only  LE swelling: on PRN Lasix at home. No swelling noted on exam.  - holding home PRN Lasix   Constipation:  - continue  home lactulose  Seizures disorder: takes Depakote 1000mg  BID. Keppra 1200mg  BID. Topamax 150mg  BID, Carnitor 0.38mh TID, Banzel susp 5mg  BID. Diastat prn rectal. Depakote level mildly elevated to 107.  - continue home regimen  GERD: on Protonix 10cc daily  - Continue Protonix   FEN/GI: heart healthy diet/ protonix Prophylaxis: lovenox  Disposition: admit to step down unit for observation under attending Jennette Kettle  History of Present Illness:  Michele [name] is a 31 y.o. non-verbal female with infantile cerebral palsy who is accompanied by her mother who reports a change in her behavior from her baseline and rectal temp of 95 at home two days ago. Also notes decreased urine output and appetite. Seen in [facility] the day prior and was discharged with instructions to take lasix daily for 5 day and to follow up with PCP. Mom noted no improvement in symptoms and brought patient to be evaluated at [facility]. BP upon presentation 94/38 and was tachycardic to 101 and temp was 96 and Lactic acid WNL. Temperature stabilized with bair hugger and BP improved after three boluses. Started on vanc/zosyn and transferred to [facility] to step down unit for further care.  Mom states that patient appears to be back to her baseline upon arrival to [facility].    Review Of Systems: Per HPI   Review of Systems  Constitutional: Negative for chills and fever.  Respiratory: Negative for cough, shortness of breath and wheezing.   Gastrointestinal: Negative for abdominal pain, diarrhea and vomiting.  Genitourinary: Negative for frequency.  Skin: Negative for itching and rash.  Neurological: Negative for seizures.    Patient Active Problem  List   Diagnosis Date Noted  . Hypothermia 08/17/2016  . Leg edema, left 04/21/2016  . Hypokalemia   . Unsteadiness on feet   . Community acquired pneumonia of right lower lobe of lung (HCC) 04/05/2016  . Hypoxia 04/05/2016  . Productive cough 04/03/2016  . Stye left upper eyelid 11/25/2015  .  Intertriginous candidiasis 09/15/2015  . Rhinitis, non-allergic 06/23/2015  . Special screening examination for human papillomavirus (HPV) 07/20/2014  . Hypernatremia 06/02/2014  . Amenorrhea 05/19/2014  . 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 Long-term use of high-risk medication 12/24/2012  . Morbid obesity (HCC) 12/24/2012  . 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 URINARY INCONTINENCE 02/25/2010  . 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 Hypothyroidism 01/29/2009  . 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 Infantile cerebral palsy (HCC) 01/29/2009  . GASTROESOPHAGEAL REFLUX DISEASE 01/29/2009  . Osteoporosis 01/29/2009  . Seizure disorder (HCC) 01/29/2009    Past Medical History: Past Medical History:  Diagnosis Date  . Cerebral palsy (HCC)   . Hypothyroidism   . Lennox-Gastaut syndrome (HCC)   . Osteoporosis   . Pneumonia   . Profound mental retardation   . Reflux   . Rickets   . Seizures (HCC)     Past Surgical History: Past Surgical History:  Procedure Laterality Date  . FOOT MASS EXCISION Right   . Gastrocutaneous fistual repair  2012  . PEG TUBE PLACEMENT      Social History: Social History  Substance Use Topics  . Smoking status: Never Smoker  . Smokeless tobacco: Never Used  . Alcohol use No    Family History: Family History  Problem Relation Age of Onset  . Hypertension Mother   . Heart disease Maternal Grandmother     Died at 42  . Stroke Maternal Grandfather     died at 24  . Hypertension Paternal Grandmother   . Diabetes      MGGM  . Heart disease      MGGF  . Kidney disease      Maternal Great Aunt   . Cancer Maternal Aunt     lung  . Colon cancer Neg Hx    Allergies and Medications: Allergies  Allergen Reactions  . Carbamazepine Rash   No current facility-administered medications on file prior to encounter.    Current  Outpatient Prescriptions on File Prior to Encounter  Medication Sig Dispense Refill  . alendronate (FOSAMAX) 70 MG tablet TAKE 1 TABLET BY MOUTH EVERY 7 DAYS ON SATURDAY WITH A FULL GLASS OF WATER ON AN EMPTY STOMACH 12 tablet 0  . BANZEL 40 MG/ML SUSP TAKE 5 MLS BY MOUTH IN THE MORNING AND 5 MLS BY MOUTH AT NIGHT 460 mL 5  . Calcium Carbonate-Vitamin D (CALCARB 600/D) 600-400 MG-UNIT per tablet Take 1 tablet by mouth 2 (two) times daily. 1 tab per tube 2 time a day    . CARNITOR 1 GM/10ML solution TAKE 5 MLS BY MOUTH THREE TIMES DAILY 510 mL 5  . DEPAKOTE SPRINKLES 125 MG capsule TAKE 8 CAPSULES BY MOUTH TWICE DAILY 496 capsule 5  . feeding supplement, ENSURE ENLIVE, (ENSURE ENLIVE) LIQD Take 237 mLs by mouth 3 (three) times daily between meals. 237 mL 12  . fluticasone (FLONASE) 50 MCG/ACT nasal spray SHAKE LIQUID AND USE 2 SPRAYS IN EACH NOSTRIL DAILY 16 g 5  . folic acid (FOLVITE) 1 MG tablet Take 1 mg by mouth every morning. 1  tab per tube daily    . furosemide (LASIX) 20 MG tablet TAKE 1 TABLET(20 MG) BY MOUTH DAILY AS NEEDED (Patient taking differently: TAKE 1 TABLET(20 MG) BY MOUTH DAILY AS NEEDED FLUID RETENTION) 30 tablet 0  . KEPPRA 100 MG/ML solution TAKE 12 MLS BY MOUTH EVERY MORNING AND 12 MLS EVERY EVENING 816 mL 5  . lactulose (CHRONULAC) 10 GM/15ML solution TAKE 30 ML BY MOUTH TWICE DAILY 1892 mL 5  . PROTONIX 40 MG PACK PLACE 10 ML INTO THE FEEDING TUBE DAILY 30 each 5  . TOPAMAX SPRINKLE 25 MG capsule TAKE 6 CAPSULES BY MOUTH EVERY MORNING AND 6 CAPSULES BY MOUTH EVERY EVENING WITH FOOD 408 capsule 0  . clotrimazole (LOTRIMIN) 1 % cream APPLY EXTERNALLY TO THE AFFECTED AREA TWICE DAILY 60 g 0  . DIASTAT ACUDIAL 20 MG GEL DIAL TO 15 MG, GIVE RECTALLY FOR SEIZURES LASTING 2 MINUTES OR LONGER 2 Package 5  . hydrocortisone cream 0.5 % Apply 1 application topically 2 (two) times daily. 60 g 0  . NYSTATIN powder APPLY TO AFFECTED DIAPER AREAS WITH EVERY DIAPER CHANGE UNTIL RASH CLEARS  60 g 0  . ondansetron (ZOFRAN) 4 MG/5ML solution TAKE 5 MLS FOR PAIN EVERY 8 HOURS AS NEEDED FOR NAUSEA OR VOMITING 50 mL 11    Objective: BP 132/78 (BP Location: Right Arm)   Pulse 102   Temp 99.1 F (37.3 C) (Rectal)   Resp 21   Ht 4\' 11"  (1.499 m)   Wt 198 lb (89.8 kg)   SpO2 95%   BMI 39.99 kg/m  Exam: General: 31 year old female sitting up in bed appearing comfortable and in no acute distress HEENT: normocephalic atraumatic, eyes noninjected, anicteric, EOMI, MMM Neck: supple, no LAD, FROM Cardiovascular: RRR, no MRG, 2+ palpable pulses Respiratory: no increased WOB, CTABL, no wheezing or rhonchi Gastrointestinal: soft, non-tender, non-distended, no palpable masses MSK: no edema or tenderness Derm: warm and dry, no new rashes Neuro: Alert, looking around room, moves all extremities  Labs and Imaging: CBC BMET   Recent Labs Lab 08/16/16 1921  WBC 9.6  HGB 11.9*  HCT 37.4  PLT 218    Recent Labs Lab 08/16/16 1921  NA 141  K 4.0  CL 108  CO2 27  BUN 22*  CREATININE 0.77  GLUCOSE 72  CALCIUM 9.0     Lipase 31 Ammonia 46 TSH 3.28, T40.76 Influenza negative  Lactic Acid 1.3 >0.97 UA: 5 ketones, otherwise normal Valproic Acid level (2/27) 107  Dg Chest 2 View  Result Date: 08/15/2016 CLINICAL DATA:  Cough, congestion, increasing leg swelling EXAM: CHEST  2 VIEW COMPARISON:  04/12/2016 FINDINGS: Cardiomegaly again noted. No infiltrate or pleural effusion. No pulmonary edema. Mild basilar atelectasis. Mild dextroscoliosis lower thoracic spine. IMPRESSION: Stable cardiomegaly. Mild basilar atelectasis. No infiltrate or pulmonary edema. Electronically Signed   By: Natasha MeadLiviu  Pop M.D.   On: 08/15/2016 10:59    Renne Muscaaniel L Warden, MD 08/17/2016, 3:33 AM PGY-1, [facility] FPTS Intern pager: [phone], text pages welcome  UPPER LEVEL ADDENDUM  I have read the above note and made revisions highlighted in blue.  Palma HolterKanishka G Tamaria Dunleavy, MD PGY-2 [facility] Pager [phone]

## 2016-08-17 NOTE — Progress Notes (Signed)
Pharmacy Antibiotic Note  Michele [name] is a 31 y.o. female admitted on 08/16/2016 with sepsis.  Pharmacy has been consulted for Vancomycin dosing.  Plan: Vancomycin 1gm IV every 8 hours.  Goal trough 15-20 mcg/mL.  Daily Serum Creatinine   Height: 4\' 11"  (149.9 cm) Weight: 198 lb (89.8 kg) IBW/kg (Calculated) : 43.2  Temp (24hrs), Avg:97.4 F (36.3 C), Min:95.9 F (35.5 C), Max:99.1 F (37.3 C)   Recent Labs Lab 08/15/16 0959 08/15/16 1020 08/16/16 1921 08/16/16 2002 08/16/16 2318  WBC 9.5  --  9.6  --   --   CREATININE 0.87  --  0.77  --   --   LATICACIDVEN  --  1.57  --  1.32 0.97    Estimated Creatinine Clearance: 100.3 mL/min (by C-G formula based on SCr of 0.77 mg/dL).    Allergies  Allergen Reactions  . Carbamazepine Rash    Antimicrobials this admission: Vancomycin 08/17/2016 >> Zosyn 08/17/2016 x1  Dose adjustments this admission: -  Microbiology results: pending  Thank you for allowing pharmacy to be a part of this patient's care.  Aleene DavidsonGrimsley Jr, Carnel Stegman Crowford 08/17/2016 4:19 AM

## 2016-08-17 NOTE — Progress Notes (Signed)
Family Medicine Teaching Service Transfer Acceptance Note  Brief summary of reason for admission/transfer: altered mental status and hypthermia Time accepted for transfer: 0108 08/17/16 ED provider with whom patient discussed: Alvira Mondayrin Schlossman  Please note that patients [facility] must [facility] within two hours of acceptance for transfer.  Palma HolterKanishka G Gunadasa, MD PGY-2, Family Medicine Teaching Service Service Pager [phone]

## 2016-08-17 NOTE — Progress Notes (Signed)
Paged by Dr. Dalene SeltzerSchlossman informing FMTS that there are no stepdown beds available at [facility] at the moment, therefore transfer is cancelled. [facility]  Appreciate excellent care by team in [facility].

## 2016-08-17 NOTE — Progress Notes (Signed)
FPTS Interim Progress Note  Patient in bed and mother at bedside.  She is nonverbal and asleep, but awakes easily for exam.  She is well appearing and cheerful with no signs of distress.  Mom reports she is back at her baseline and is eating a little better than she was previously.    BP (!) 98/47   Pulse 100   Temp 98.1 F (36.7 C) (Axillary)   Resp (!) 23   Ht  (1.499 m)   Wt 198 lb (89.8 kg)   SpO2 100%   BMI 39.99 kg/m    Physical exam:  Gen- awake, no apparent distress, appears younger than stated age  Skin - normal, no suspicious skin lesions noted, cap refill <2 sec HEENT - PERRL, EOMI, no conjunctival injection, no rhinorrhea Neck - normal Chest - CTA B/L with normal work of breathing  Heart - RRR no MRG  Abdomen - soft, NT ND, no masses, +bs  Musculoskeletal - no edema or tenderness Neuro - alert, moves all extremities, does not follow commands  A/P:  Hypothermia Resolved. Patient's body temperature 97 (back to her baseline).  She is acting like herself and behavioral changes seem to have resolved, per mom. Discussed with mom that we will continue to monitor her through the evening with anticipated discharge in the AM if she does well.   -Transfer out of stepdown to med-surg  History of elevated MCV  -Given recent TSH on admission wnl, and no history of chronic alcoholism, will look into other underlying causes including folate and B12 deficiency.  -Check B12 and folate level   Freddrick March, MD 08/17/2016, 3:17 PM PGY-1, [facility] Family Medicine Service pager [phone]

## 2016-08-18 ENCOUNTER — Ambulatory Visit: Payer: Medicaid Other | Admitting: Family Medicine

## 2016-08-18 DIAGNOSIS — K5909 Other constipation: Secondary | ICD-10-CM

## 2016-08-18 DIAGNOSIS — T68XXXD Hypothermia, subsequent encounter: Secondary | ICD-10-CM

## 2016-08-18 DIAGNOSIS — R4182 Altered mental status, unspecified: Secondary | ICD-10-CM

## 2016-08-18 LAB — URINE CULTURE: CULTURE: NO GROWTH

## 2016-08-18 LAB — CREATININE, SERUM: CREATININE: 0.76 mg/dL (ref 0.44–1.00)

## 2016-08-18 LAB — FOLATE RBC
FOLATE, HEMOLYSATE: 262.6 ng/mL
FOLATE, RBC: 810 ng/mL (ref >498)
Hematocrit: 32.4 % — ABNORMAL LOW (ref 34.0–46.6)

## 2016-08-18 NOTE — Progress Notes (Signed)
Family Medicine Teaching Service Daily Progress Note Intern Pager: [phone]  Patient name: Michele [name] Medical record number: [id number] Date of birth: [birth year] Age: 31 y.o. Gender: female  Primary Care Provider: Delynn FlavinAshly Gottschalk, DO Consultants: None Code Status: FULL   Pt Overview and Major Events to Date:  Admit 3/1  Assessment and Plan:   Michele [name] is a 31 y.o. female reportedly with change in behavior from her baseline, hypothermia, sleeplessness, bilateral lower extremity edema and decreased appetite.   Hypothermia/behavioral changes, resolved. Mental status and blood pressure improved over the course of yesterday. Patient back to baseline per mother.  Monitored on stepdown overnight and no acute events overnight.  -Patient stable for discharge today  Infantile cerebral palsy/intellectual disability:  completely dependent upon mother for ADLs. Lower extremity edema, non-pitting. Lasix qday or every other day.  - OOB with assistance only  LE swelling: on PRN Lasix at home. No swelling noted on exam.  - holding home PRN Lasix    Constipation:  - continue home lactulose   Seizures disorder: takes Depakote 1000mg  BID. Keppra 1200mg  BID. Topamax 150mg  BID, Carnitor 0.445mh TID, Banzel susp 5mg  BID. Diastat prn rectal. Depakote level mildly elevated to 107.   - continue home regimen   GERD: on Protonix 10cc daily  - Continue Protonix   FEN/GI: heart healthy diet/ protonix Prophylaxis: lovenox  Disposition: Discharge home this morning   Subjective:  Patient with mother at bedside.  She did well overnight with no fevers or hypothermia.  No acute events.   Objective: Temp:  [97.4 F (36.3 C)-98.5 F (36.9 C)] 98.2 F (36.8 C) (03/02 1208) Pulse Rate:  [80-91] 89 (03/02 1208) Resp:  [16-24] 24 (03/02 1208) BP: (84-104)/(53-65) 104/61 (03/02 1208) SpO2:  [93 %-97 %] 95 % (03/02 1208) Weight:  [196 lb 3.4 oz (89 kg)] 196 lb 3.4 oz (89 kg) (03/02 0415)    Physical Exam: Gen- appears well , awake, no apparent distress, mother at bedside Skin - normal, no suspicious skin lesions noted HEENT - PERRL, EOMI Neck - normal Chest - CTA B/L with normal work of breathing  Heart - RRR no MRG  Abdomen - soft, NT ND, no masses, +bs  Musculoskeletal - no edema or tenderness Neuro - alert, nonverbal  Laboratory:  Recent Labs Lab 08/15/16 0959 08/16/16 1921 08/17/16 0701 08/17/16 1113  WBC 9.5 9.6 9.1  --   HGB 12.5 11.9* 10.7*  --   HCT 40.5 37.4 35.3* 32.4*  PLT 195 218 203  --     Recent Labs Lab 08/15/16 0959 08/16/16 1921 08/17/16 0701 08/18/16 0206  NA 143 141 140  --   K 4.3 4.0 4.1  --   CL 108 108 113*  --   CO2 25 27 22   --   BUN 26* 22* 14  --   CREATININE 0.87 0.77 0.80 0.76  CALCIUM 9.3 9.0 7.7*  --   PROT  --  7.5  --   --   BILITOT  --  0.3  --   --   ALKPHOS  --  114  --   --   ALT  --  14  --   --   AST  --  20  --   --   GLUCOSE 84 72 86  --    Imaging/Diagnostic Tests: No results found.  Freddrick MarchYashika Gradyn Shein, MD 08/18/2016, 3:16 PM PGY-1, [facility] Family Medicine FPTS Intern pager: [phone], text pages welcome

## 2016-08-18 NOTE — Discharge Summary (Signed)
[facility]  Patient name: Michele [name]: [id number] Date of birth: [birth year] Age: 31 y.o. Gender: female Date of Admission: 08/16/2016  Date of Discharge: 08/18/2016 Admitting Physician: Nestor RampSara L Neal, MD  Primary Care Provider: Delynn FlavinAshly Gottschalk, DO Consultants: None  Indication for Hospitalization: hypothermia, behavior change from baseline   Discharge Diagnoses/Problem List:  Altered mental status Hypothermia  Disposition: Home   Discharge Condition: Stable, improved  Discharge Exam:  Gen- appears well , awake, no apparent distress, mother at bedside Skin- normal, no suspicious skin lesions noted HEENT- PERRL, EOMI Neck - normal Chest - CTA B/L with normal work of breathing  Heart- RRR no MRG  Abdomen- soft, NT ND, no masses, +bs  Musculoskeletal -no edema or tenderness Neuro- alert, nonverbal  Brief Hospital Course:   31 yo female with PMH significant for infantile CP and seizure disorder.  Patient was admitted to the hospital for hypothermia.  Per mother, rectal temp of 95 degrees.  Patient's temperatures at baseline are around 97 degrees likely secondary to autonomic dysfunction.  Also with noted behavioral changes, LE edema and decreased appetite.  She was hypotensive to 94/38. No leukocytosis was noted and UA negative.  She had a recent negative flu swab.  CXR from 2/27 without infiltrate.  In the ED she was given Vanc/Zosyn and 3L IVF.  Antibiotics were not continued as no particular concern for infection and no identifiable source.  Temperature stabilized with bair hugger.  Mental status and BP both improved with fluids.  Admitted to step down unit for further care. Michele [name] very quickly returned to her baseline and was kept overnight for monitoring.  She continued to do well and was stable for discharge.    Issues for Follow Up:  1. Hypothermia  2. Blood pressure 3. Mental status  Significant  Procedures: None  Significant Labs and Imaging:   Recent Labs Lab 08/15/16 0959 08/16/16 1921 08/17/16 0701 08/17/16 1113  WBC 9.5 9.6 9.1  --   HGB 12.5 11.9* 10.7*  --   HCT 40.5 37.4 35.3* 32.4*  PLT 195 218 203  --     Recent Labs Lab 08/15/16 0959 08/16/16 1921 08/17/16 0701 08/18/16 0206  NA 143 141 140  --   K 4.3 4.0 4.1  --   CL 108 108 113*  --   CO2 25 27 22   --   GLUCOSE 84 72 86  --   BUN 26* 22* 14  --   CREATININE 0.87 0.77 0.80 0.76  CALCIUM 9.3 9.0 7.7*  --   ALKPHOS  --  114  --   --   AST  --  20  --   --   ALT  --  14  --   --   ALBUMIN  --  3.2*  --   --     Results/Tests Pending at Time of Discharge: None  Discharge Medications:  Allergies as of 08/18/2016      Reactions   Carbamazepine Rash      Medication List    TAKE these medications   alendronate 70 MG tablet Commonly known as:  FOSAMAX TAKE 1 TABLET BY MOUTH EVERY 7 DAYS ON SATURDAY WITH A FULL GLASS OF WATER ON AN EMPTY STOMACH   BANZEL 40 MG/ML Susp Generic drug:  Rufinamide TAKE 5 MLS BY MOUTH IN THE MORNING AND 5 MLS BY MOUTH AT NIGHT   CALCARB 600/D 600-400 MG-UNIT tablet Generic drug:  Calcium  Carbonate-Vitamin D Take 1 tablet by mouth 2 (two) times daily. 1 tab per tube 2 time a day   CARNITOR 1 GM/10ML solution Generic drug:  levOCARNitine TAKE 5 MLS BY MOUTH THREE TIMES DAILY   clotrimazole 1 % cream Commonly known as:  LOTRIMIN APPLY EXTERNALLY TO THE AFFECTED AREA TWICE DAILY   DEPAKOTE SPRINKLES 125 MG capsule Generic drug:  divalproex TAKE 8 CAPSULES BY MOUTH TWICE DAILY   DIASTAT ACUDIAL 20 MG Gel Generic drug:  diazepam DIAL TO 15 MG, GIVE RECTALLY FOR SEIZURES LASTING 2 MINUTES OR LONGER   feeding supplement (ENSURE ENLIVE) Liqd Take 237 mLs by mouth 3 (three) times daily between meals.   fluticasone 50 MCG/ACT nasal spray Commonly known as:  FLONASE SHAKE LIQUID AND USE 2 SPRAYS IN EACH NOSTRIL DAILY   folic acid 1 MG tablet Commonly known  as:  FOLVITE Take 1 mg by mouth every morning. 1 tab per tube daily   furosemide 20 MG tablet Commonly known as:  LASIX TAKE 1 TABLET(20 MG) BY MOUTH DAILY AS NEEDED What changed:  See the new instructions.   hydrocortisone cream 0.5 % Apply 1 application topically 2 (two) times daily.   KEPPRA 100 MG/ML solution Generic drug:  levETIRAcetam TAKE 12 MLS BY MOUTH EVERY MORNING AND 12 MLS EVERY EVENING   lactulose 10 GM/15ML solution Commonly known as:  CHRONULAC TAKE 30 ML BY MOUTH TWICE DAILY   nystatin powder Generic drug:  nystatin APPLY TO AFFECTED DIAPER AREAS WITH EVERY DIAPER CHANGE UNTIL RASH CLEARS   ondansetron 4 MG/5ML solution Commonly known as:  ZOFRAN TAKE 5 MLS FOR PAIN EVERY 8 HOURS AS NEEDED FOR NAUSEA OR VOMITING   PROTONIX 40 mg/20 mL Pack Generic drug:  pantoprazole sodium PLACE 10 ML INTO THE FEEDING TUBE DAILY   TOPAMAX SPRINKLE 25 MG capsule Generic drug:  topiramate TAKE 6 CAPSULES BY MOUTH EVERY MORNING AND 6 CAPSULES BY MOUTH EVERY EVENING WITH FOOD      Discharge Instructions: Please refer to Patient Instructions section of EMR for full details.  Patient was counseled important signs and symptoms that should prompt return to medical care, changes in medications, dietary instructions, activity restrictions, and follow up appointments.   Follow-Up Appointments: [facility] with PCP Delynn Flavin on 08/23/2016 at 8:30 AM.     Freddrick March, MD 08/18/2016, 9:20 PM PGY-1, [facility] Family Medicine

## 2016-08-18 NOTE — Discharge Instructions (Signed)
It was nice to see you again! Michele [name] was admitted to the hospital for hypothermia and changes in her behavior from baseline.  She improved over the course of the 2 days and appears to be doing great.  We have not made any changes to her medications and feel she is stable to go home.  She has a follow up appointment on 08/23/2016 with Dr. Nadine CountsGottschalk and should follow up with her.    Freddrick MarchYashika Shylin Keizer, MD

## 2016-08-21 LAB — CULTURE, BLOOD (ROUTINE X 2): CULTURE: NO GROWTH

## 2016-08-22 LAB — CULTURE, BLOOD (ROUTINE X 2): Culture: NO GROWTH

## 2016-08-23 ENCOUNTER — Ambulatory Visit: Payer: Medicaid Other | Admitting: Family Medicine

## 2016-08-23 ENCOUNTER — Ambulatory Visit (INDEPENDENT_AMBULATORY_CARE_PROVIDER_SITE_OTHER): Payer: Medicaid Other | Admitting: Family Medicine

## 2016-08-23 ENCOUNTER — Encounter: Payer: Self-pay | Admitting: Family Medicine

## 2016-08-23 VITALS — Temp 98.0°F

## 2016-08-23 DIAGNOSIS — Z09 Encounter for follow-up examination after completed treatment for conditions other than malignant neoplasm: Secondary | ICD-10-CM

## 2016-08-23 NOTE — Patient Instructions (Signed)
She looks great.  Follow up with me as needed.

## 2016-08-23 NOTE — Progress Notes (Signed)
    Subjective: CC: hospital follow up HPI: Michele [name] is a 31 y.o. female presenting to clinic today for:  1. Hospital follow up Patient was hospitalized 2/28-3/2 for hypothermia/ behavior change.  Initially, there was concern for infection.  However no source was found.  She was not discharged on abx.  Her Bcx have been negative.  Since discharge, mother reports that patient has been doing very well since discharge.  She has been acting her normal self.  She is voiding and eating normally.  She has had no fevers, vomiting, diarrhea, cough.  She notes that she is going to keep her out of her Day program until flu season is over.  Additionally, she voices concern that she is supposed to go to jury duty soon.  She has no other persons that she can leave Michele [name] with.   Social Hx reviewed: non smoker. MedHx, medications and allergies reviewed.  Please see EMR. ROS: Per HPI  Objective: Office vital signs reviewed. Temp 98 F (36.7 C) (Axillary)   SpO2 91% Comment: fingertips were cold, unsure that this is a good reading  Physical Examination:  General: Awake, alert, well nourished, well appearing, sitting and clapping in her wheel chair.  No acute distress HEENT: Normal    Eyes: PERRLA, sclera white    Nose: nasal turbinates moist, no nasal discharge    Throat: moist mucus membranes, no erythema, poor dentition.  Airway is patent Cardio: regular rate and rhythm, S1S2 heard, no murmurs appreciated Pulm: clear to auscultation bilaterally, no wheezes, rhonchi or rales; normal work of breathing on room air MSK: +1 LE edema to mid shins, compression hose in place, fingers are cool  Assessment/ Plan: 31 y.o. female   1. Hospital discharge follow-up.  Patient doing very well since discharge.  Mother voices no additional concerns.  BCx reviewed and are negative for growth. Folate and B12 WNL.  May ultimately need iron supplementation.  For now, encourage good PO intake. - Note provided for day  program and for jury duty - Return precautions reviewed - Follow up prn   Raliegh IpAshly M Gottschalk, DO PGY-3, [facility]

## 2016-08-28 ENCOUNTER — Ambulatory Visit (INDEPENDENT_AMBULATORY_CARE_PROVIDER_SITE_OTHER): Payer: Medicaid Other | Admitting: Orthopaedic Surgery

## 2016-08-30 ENCOUNTER — Other Ambulatory Visit: Payer: Self-pay | Admitting: Family Medicine

## 2016-08-30 DIAGNOSIS — R600 Localized edema: Secondary | ICD-10-CM

## 2016-08-31 ENCOUNTER — Other Ambulatory Visit: Payer: Self-pay | Admitting: Family Medicine

## 2016-08-31 DIAGNOSIS — R600 Localized edema: Secondary | ICD-10-CM

## 2016-08-31 MED ORDER — FUROSEMIDE 20 MG PO TABS: ORAL_TABLET | ORAL | 0 refills | Status: DC

## 2016-09-27 ENCOUNTER — Other Ambulatory Visit: Payer: Self-pay | Admitting: Family Medicine

## 2016-09-27 DIAGNOSIS — R600 Localized edema: Secondary | ICD-10-CM

## 2016-09-29 ENCOUNTER — Other Ambulatory Visit: Payer: Self-pay | Admitting: Family Medicine

## 2016-10-04 ENCOUNTER — Encounter (HOSPITAL_COMMUNITY): Payer: Self-pay | Admitting: *Deleted

## 2016-10-04 ENCOUNTER — Emergency Department (HOSPITAL_COMMUNITY)
Admission: EM | Admit: 2016-10-04 | Discharge: 2016-10-04 | Disposition: A | Discharge status: Home / Self Care | Payer: Medicaid Other | Attending: Emergency Medicine | Admitting: Emergency Medicine

## 2016-10-04 DIAGNOSIS — E039 Hypothyroidism, unspecified: Secondary | ICD-10-CM | POA: Insufficient documentation

## 2016-10-04 DIAGNOSIS — Z79899 Other long term (current) drug therapy: Secondary | ICD-10-CM | POA: Insufficient documentation

## 2016-10-04 DIAGNOSIS — R569 Unspecified convulsions: Secondary | ICD-10-CM | POA: Insufficient documentation

## 2016-10-04 DIAGNOSIS — G809 Cerebral palsy, unspecified: Secondary | ICD-10-CM | POA: Insufficient documentation

## 2016-10-04 LAB — BASIC METABOLIC PANEL
ANION GAP: 6 (ref 5–15)
BUN: 20 mg/dL (ref 6–20)
CHLORIDE: 112 mmol/L — AB (ref 101–111)
CO2: 26 mmol/L (ref 22–32)
Calcium: 9 mg/dL (ref 8.9–10.3)
Creatinine, Ser: 0.86 mg/dL (ref 0.44–1.00)
GFR calc non Af Amer: >60 mL/min (ref >60)
Glucose, Bld: 80 mg/dL (ref 65–99)
POTASSIUM: 5.3 mmol/L — AB (ref 3.5–5.1)
SODIUM: 144 mmol/L (ref 135–145)

## 2016-10-04 LAB — CBC WITH DIFFERENTIAL/PLATELET
Basophils Absolute: 0 10*3/uL (ref 0.0–0.1)
Basophils Relative: 0 %
EOS ABS: 0 10*3/uL (ref 0.0–0.7)
EOS PCT: 0 %
HCT: 34.8 % — ABNORMAL LOW (ref 36.0–46.0)
Hemoglobin: 10.3 g/dL — ABNORMAL LOW (ref 12.0–15.0)
LYMPHS ABS: 4.1 10*3/uL — AB (ref 0.7–4.0)
Lymphocytes Relative: 42 %
MCH: 31.4 pg (ref 26.0–34.0)
MCHC: 29.6 g/dL — AB (ref 30.0–36.0)
MCV: 106.1 fL — AB (ref 78.0–100.0)
MONO ABS: 0.9 10*3/uL (ref 0.1–1.0)
MONOS PCT: 9 %
Neutro Abs: 4.6 10*3/uL (ref 1.7–7.7)
Neutrophils Relative %: 49 %
PLATELETS: 271 10*3/uL (ref 150–400)
RBC: 3.28 MIL/uL — ABNORMAL LOW (ref 3.87–5.11)
RDW: 17.4 % — AB (ref 11.5–15.5)
WBC: 9.6 10*3/uL (ref 4.0–10.5)

## 2016-10-04 LAB — VALPROIC ACID LEVEL: Valproic Acid Lvl: 86 ug/mL (ref 50.0–100.0)

## 2016-10-04 LAB — CBG MONITORING, ED
GLUCOSE-CAPILLARY: 86 mg/dL (ref 65–99)
Glucose-Capillary: 58 mg/dL — ABNORMAL LOW (ref 65–99)

## 2016-10-04 NOTE — Discharge Instructions (Signed)
Take antiseizure medications as previous prescribed by PCP. Follow up with PCP for further evaluation. Return to ED for worsening symptoms, chest pain, trouble breathing, loss of consciousness, decreased activity or fever.

## 2016-10-04 NOTE — ED Provider Notes (Signed)
[facility]-EMER[facility]ENCY DEPT Provider Note   CSN: [id number] Arrival date & time: 10/04/16  1449     History   Chief Complaint Chief Complaint  Patient presents with  . Seizures    HPI Michele [name] is a 32 y.o. female.  Patient with history of cerebral palsy, mental retardation and seizures, presents with seizure that occurred PTA and observed by caretaker. Per caretaker, they were getting into the car to go to her primary care office for evaluation of twitching when she began having a tonic-clonic seizure. The seizure was similar to her previous seizures, although she has not had one in over a month and last one was managed with diazepam. She was unable to give her diazepam today because she was already sitting in the car and did not want to move her. Caretaker reports compliance with all antiseizure medications. Denies any recent dosage adjustments. Denies any fever, recent illness, other complaints. States that the patient is not having any urinary complaints, bowel complaints, abdominal pain, loss of consciousness, recent illness.      Past Medical History:  Diagnosis Date  . Cerebral palsy (HCC)   . Hypothyroidism   . Lennox-Gastaut syndrome (HCC)   . Osteoporosis   . Pneumonia   . Profound mental retardation   . Reflux   . Rickets   . Seizures [facility])     Patient Active Problem List   Diagnosis Date Noted  . Other constipation   . Unsteadiness on feet   . Rhinitis, non-allergic 06/23/2015  . Special screening examination for human papillomavirus (HPV) 07/20/2014  . Amenorrhea 05/19/2014  . 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 Long-term use of high-risk medication 12/24/2012  . Morbid obesity (HCC) 12/24/2012  . 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 URINARY INCONTINENCE 02/25/2010  . 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 Hypothyroidism 01/29/2009  . Profound intellectual  disabilities 01/29/2009  . Infantile cerebral palsy (HCC) 01/29/2009  . GASTROESOPHAGEAL REFLUX DISEASE 01/29/2009  . Osteoporosis 01/29/2009  . Seizure disorder (HCC) 01/29/2009    Past Surgical History:  Procedure Laterality Date  . FOOT MASS EXCISION Right   . Gastrocutaneous fistual repair  2012  . PEG TUBE PLACEMENT      OB History    No data available       Home Medications    Prior to Admission medications   Medication Sig Start Date End Date Taking? Authorizing Provider  BANZEL 40 MG/ML SUSP TAKE 5 MLS BY MOUTH IN THE MORNING AND 5 MLS BY MOUTH AT NIGHT 06/26/16   Elveria Rising, NP  Calcium Carbonate-Vitamin D (CALCARB 600/D) 600-400 MG-UNIT per tablet Take 1 tablet by mouth 2 (two) times daily. 1 tab per tube 2 time a day    Historical Provider, MD  CARNITOR 1 GM/10ML solution TAKE 5 MLS BY MOUTH THREE TIMES DAILY 06/26/16   Elveria Rising, NP  clotrimazole (LOTRIMIN) 1 % cream APPLY EXTERNALLY TO THE AFFECTED AREA TWICE DAILY 01/12/16   Ashly M Gottschalk, DO  DEPAKOTE SPRINKLES 125 MG capsule TAKE 8 CAPSULES BY MOUTH TWICE DAILY 06/26/16   Elveria Rising, NP  DIASTAT ACUDIAL 20 MG GEL DIAL TO 15 MG, GIVE RECTALLY FOR SEIZURES LASTING 2 MINUTES OR LONGER 06/26/16   Elveria Rising, NP  feeding supplement, ENSURE ENLIVE, (ENSURE ENLIVE) LIQD Take 237 mLs by mouth 3 (three) times daily between meals. 04/11/16   Beaulah Dinning, MD  fluticasone (FLONASE) 50 MCG/ACT nasal  spray SHAKE LIQUID AND USE 2 SPRAYS IN [facility] NOSTRIL DAILY 01/12/16   Raliegh Ip, DO  folic acid (FOLVITE) 1 MG tablet Take 1 mg by mouth every morning. 1 tab per tube daily    Historical Provider, MD  furosemide (LASIX) 20 MG tablet TAKE 1 TABLET(20 MG) BY MOUTH DAILY AS NEEDED FOR FLUID RETENTION 09/27/16   Raliegh Ip, DO  hydrocortisone cream 0.5 % Apply 1 application topically 2 (two) times daily. 10/06/15   Ashly M Gottschalk, DO  KEPPRA 100 MG/ML solution TAKE 12 MLS BY MOUTH EVERY  MORNING AND 12 MLS EVERY EVENING 06/26/16   Elveria Rising, NP  lactulose (CHRONULAC) 10 GM/15ML solution TAKE 30 ML BY MOUTH TWICE DAILY 09/29/16   Ardith Dark, MD  NYSTATIN powder APPLY TO AFFECTED DIAPER AREAS WITH EVERY DIAPER CHANGE UNTIL RASH CLEARS 09/29/16   Ardith Dark, MD  ondansetron [facility]) 4 MG/5ML solution TAKE 5 MLS FOR PAIN EVERY 8 HOURS AS NEEDED FOR NAUSEA OR VOMITING 06/29/14   Bryan R Hess, DO  PROTONIX 40 MG PACK PLACE 10 ML INTO THE FEEDING TUBE DAILY 05/30/16   Ashly M Gottschalk, DO  TOPAMAX SPRINKLE 25 MG capsule TAKE 6 CAPSULES BY MOUTH EVERY MORNING AND 6 CAPSULES BY MOUTH EVERY EVENING WITH FOOD 07/31/16   Raliegh Ip, DO    Family History Family History  Problem Relation Age of Onset  . Hypertension Mother   . Heart disease Maternal Grandmother     Died at 66  . Stroke Maternal Grandfather     died at 62  . Hypertension Paternal Grandmother   . Diabetes      MGGM  . Heart disease      MGGF  . Kidney disease      Maternal Great Aunt   . Cancer Maternal Aunt     lung  . Colon cancer Neg Hx     Social History Social History  Substance Use Topics  . Smoking status: Never Smoker  . Smokeless tobacco: Never Used  . Alcohol use No     Allergies   Carbamazepine   Review of Systems Review of Systems  Constitutional: Negative for appetite change, chills and fever.  HENT: Negative for ear pain, rhinorrhea, sneezing and sore throat.   Eyes: Negative for photophobia and visual disturbance.  Respiratory: Negative for cough, chest tightness, shortness of breath and wheezing.   Cardiovascular: Negative for chest pain and palpitations.  Gastrointestinal: Negative for abdominal pain, blood in stool, constipation, diarrhea, nausea and vomiting.  Genitourinary: Negative for dysuria, hematuria and urgency.  Musculoskeletal: Negative for myalgias.  Skin: Negative for rash.  Neurological: Positive for seizures. Negative for dizziness, weakness and  light-headedness.     Physical Exam Updated Vital Signs BP (!) 136/98   Pulse 88   Temp 98.2 F (36.8 C) (Oral)   Resp 20   SpO2 97%   Physical Exam  Constitutional: She appears well-developed and well-nourished. No distress.  Short build. Active, smiling, moving all extremities, looking around room.  HENT:  Head: Normocephalic and atraumatic.  Nose: Nose normal.  Eyes: Conjunctivae and EOM are normal. Right eye exhibits no discharge. Left eye exhibits no discharge. No scleral icterus.  Neck: Normal range of motion. Neck supple.  Cardiovascular: Normal rate, regular rhythm, normal heart sounds and intact distal pulses.  Exam reveals no gallop and no friction rub.   No murmur heard. Pulmonary/Chest: Effort normal and breath sounds normal. No respiratory distress.  Abdominal:  Soft. Bowel sounds are normal. She exhibits no distension. There is no tenderness. There is no guarding.  Musculoskeletal: Normal range of motion. She exhibits edema.  Edema of bilateral LE.  Neurological: She is alert.  Skin: Skin is warm and dry. No rash noted. She is not diaphoretic.  Psychiatric: She has a normal mood and affect.  Nursing note and vitals reviewed.    ED Treatments / Results  Labs (all labs ordered are listed, but only abnormal results are displayed) Labs Reviewed  BASIC METABOLIC PANEL - Abnormal; Notable for the following:       Result Value   Potassium 5.3 (*)    Chloride 112 (*)    All other components within normal limits  CBC WITH DIFFERENTIAL/PLATELET - Abnormal; Notable for the following:    RBC 3.28 (*)    Hemoglobin 10.3 (*)    HCT 34.8 (*)    MCV 106.1 (*)    MCHC 29.6 (*)    RDW 17.4 (*)    Lymphs Abs 4.1 (*)    All other components within normal limits  CBG MONITORING, ED - Abnormal; Notable for the following:    Glucose-Capillary 58 (*)    All other components within normal limits  VALPROIC ACID LEVEL  CBG MONITORING, ED    EKG  EKG  Interpretation None       Radiology No results found.  Procedures Procedures (including critical care time)  Medications Ordered in ED Medications - No data to display   Initial Impression / Assessment and Plan / ED Course  I have reviewed the triage vital signs and the nursing notes.  Pertinent labs & imaging results that were available during my care of the patient were reviewed by me and considered in my medical decision making (see chart for details).     Patient's history concerning for seizure, breakthrough from possibly fever, new medication, changes in medication, low glucose, electrolyte imabalance. She does have a history of epilepsy which is controlled with medications including Depakote and Topamax and Keppra. Caretaker reports compliance with all medication. No new medications that could have lowered seizure threshold and absence of fever. Patient is afebrile here. No seizure activity here. Caretaker states she does not notice any deficits or changes in activity suggesting postictal state. CBG 58 here in ED. Given ginger ale and graham crackers. CBG was 86 on recheck. CBC unremarkable aside from mild anemia. Depakote level within normal range.  All electrolytes were within normal limits. Advised caretaker to continue prn diazepam if any seizures persist and should follow up with PCP for further evaluation or medication adjustments if needed. Cannot find a reason for her seizure on today's visit. Her taker states that she will let her PCP note tomorrow. Return precautions given.    Final Clinical Impressions(s) / ED Diagnoses   Final diagnoses:  Seizure [facility])    New Prescriptions New Prescriptions   No medications on file     Dietrich Pates, PA-C 10/04/16 1832    Jerelyn Scott, MD 10/10/16 [phone]

## 2016-10-04 NOTE — ED Triage Notes (Signed)
Per family, pt was going to dr appt for twitching, had seizure lasting approx 1 min in the car. Pt has hx of seizures.

## 2016-10-23 ENCOUNTER — Ambulatory Visit (INDEPENDENT_AMBULATORY_CARE_PROVIDER_SITE_OTHER): Payer: Medicaid Other | Admitting: Orthopaedic Surgery

## 2016-10-23 ENCOUNTER — Ambulatory Visit (INDEPENDENT_AMBULATORY_CARE_PROVIDER_SITE_OTHER): Payer: Medicaid Other

## 2016-10-23 ENCOUNTER — Encounter (INDEPENDENT_AMBULATORY_CARE_PROVIDER_SITE_OTHER): Payer: Self-pay | Admitting: Orthopaedic Surgery

## 2016-10-23 DIAGNOSIS — M79604 Pain in right leg: Secondary | ICD-10-CM

## 2016-10-23 NOTE — Progress Notes (Signed)
Office Visit Note   Patient: Michele [name]           Date of Birth: [birth year]           MRN: [id number] Visit Date: 10/23/2016              Requested by: Raliegh Ip, DO [address] [place], [place] [address] PCP: Raliegh Ip, DO   Assessment & Plan: Visit Diagnoses:  1. Pain in right leg           Healing right mid to distal third tibial shaft fracture  Plan: Healing right tibial fracture. She has extensive callus formation primarily posterior and extends down almost to the ankle joint. She'll return in 5 weeks for repeat x-rays of the right tibia AP and lateral x-ray. Note given limiting her weightbearing until she returns. With the amount of callus she is artery may periosteally do not think casting would likely give her much additional support. She appears to be comfortable and nonweightbearing. Recheck 5 weeks with repeat x-rays.  Follow-Up Instructions: No Follow-up on file.   Orders:  Orders Placed This Encounter  Procedures  . XR Tibia/Fibula Right  . XR FEMUR, MIN 2 VIEWS RIGHT  . XR Foot 2 Views Right   No orders of the defined types were placed in this encounter.     Procedures: No procedures performed   Clinical Data: No additional findings.   Subjective: Chief Complaint  Patient presents with  . Right Leg - Pain    HPI 31 year old female with severe CP he graduated from [employer] in 2010. Patient goes to day care center and has been limping does not want to apply weight to the right lower extremity. She is nonverbal son determinate what area of the lower extremities giving her pain. Mother is with her they deny fever or chills.  Review of Systems 14 point review of systems updated positive for long list of multiple problems including incontinence of bowel bladder, severe CP involvement nonverbal communication congenital quadriplegia,, seizures history of pneumonia, obesity   Objective: Vital Signs: There were no vitals taken for  this visit.  Physical Exam  Constitutional: She is oriented to person, place, and time. She appears well-developed.  Patient verbalizes making some noise but is nonconversant. She claps appears happy tracks with her eyes. In a wheelchair not able ambulate.  HENT:  Head: Normocephalic.  Right Ear: External ear normal.  Left Ear: External ear normal.  Eyes: Pupils are equal, round, and reactive to light.  Neck: No tracheal deviation present. No thyromegaly present.  Cardiovascular: Normal rate.   Pulmonary/Chest: Effort normal.  Abdominal: Soft.  Musculoskeletal:  Patient is obese she has the adipose deposits from both legs and feet no definite pitting edema. He does not seem to withdraw palpation around the tibia or with extension of the knees. Good capillary refill to the feet. Good range of motion of her hand she likes to verbalizing clap repetitively.  Neurological: She is alert and oriented to person, place, and time.  Skin: Skin is warm and dry.  Psychiatric: She has a normal mood and affect. Her behavior is normal.    Ortho Exam  Specialty Comments:  No specialty comments available.  Imaging: Xr Femur, Min 2 Views Right  Result Date: 10/23/2016 AP lateral tibia x-rays are obtained and reviewed. This shows well located hip joint. Mild bowing of the femur most noted on lateral x-ray. Negative for acute fracture. Impression: negative femur x-rays for  fracture that would explain the apparent pain with weightbearing.  Xr Foot 2 Views Right  Result Date: 10/23/2016 Two-view x-rays right foot obtained which shows pes planus deformity. No acute fracture is noted. Metatarsus abductus with hallux valgus noted. Generalized osteopenia. Periosteal new bone formation seen along the tibia room early posteriorly. Impression: Negative foot x-rays for fracture. Again seen is the tibial periosteal new bone formation.  Xr Tibia/fibula Right  Result Date: 10/23/2016 AP and lateral tibial x-rays are  obtained. This shows that the tibia the bones laterally with the apex at the mid tibia region. There is extensive periosteal new bone formation suggesting healing fracture extending from the mid tibia almost to the ankle joint. No nidus is seen that would suggest osteomyelitis. Impression: Healing tibial shaft fracture. This possibly could represent a reaction to infection. No area of bone destruction is noted.    PMFS History: Patient Active Problem List   Diagnosis Date Noted  . Other constipation   . Unsteadiness on feet   . Rhinitis, non-allergic 06/23/2015  . Special screening examination for human papillomavirus (HPV) 07/20/2014  . Amenorrhea 05/19/2014  . 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 Long-term use of high-risk medication 12/24/2012  . Morbid obesity (HCC) 12/24/2012  . 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 URINARY INCONTINENCE 02/25/2010  . 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 Hypothyroidism 01/29/2009  . 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 Infantile cerebral palsy (HCC) 01/29/2009  . GASTROESOPHAGEAL REFLUX DISEASE 01/29/2009  . Osteoporosis 01/29/2009  . Seizure disorder (HCC) 01/29/2009   Past Medical History:  Diagnosis Date  . Cerebral palsy (HCC)   . Hypothyroidism   . Lennox-Gastaut syndrome (HCC)   . Osteoporosis   . Pneumonia   . Profound mental retardation   . Reflux   . Rickets   . Seizures (HCC)     Family History  Problem Relation Age of Onset  . Hypertension Mother   . Heart disease Maternal Grandmother     Died at 8064  . Stroke Maternal Grandfather     died at 1779  . Hypertension Paternal Grandmother   . Diabetes      MGGM  . Heart disease      MGGF  . Kidney disease      Maternal Great Aunt   . Cancer Maternal Aunt     lung  . Colon cancer Neg Hx     Past Surgical History:  Procedure Laterality Date  . FOOT MASS EXCISION  Right   . Gastrocutaneous fistual repair  2012  . PEG TUBE PLACEMENT     Social History   Occupational History  . Disabled     Social History Main Topics  . Smoking status: Never Smoker  . Smokeless tobacco: Never Used  . Alcohol use No  . Drug use: No  . Sexual activity: No

## 2016-10-27 ENCOUNTER — Other Ambulatory Visit: Payer: Self-pay | Admitting: Family Medicine

## 2016-10-27 DIAGNOSIS — R600 Localized edema: Secondary | ICD-10-CM

## 2016-11-03 ENCOUNTER — Encounter (HOSPITAL_COMMUNITY): Payer: Self-pay | Admitting: Nurse Practitioner

## 2016-11-03 ENCOUNTER — Inpatient Hospital Stay (HOSPITAL_COMMUNITY)
Admission: EM | Admit: 2016-11-03 | Discharge: 2016-11-07 | DRG: 871 | Disposition: A | Discharge status: Home / Self Care | Payer: Medicaid Other | Attending: Family Medicine | Admitting: Family Medicine

## 2016-11-03 DIAGNOSIS — G40812 Lennox-Gastaut syndrome, not intractable, without status epilepticus: Secondary | ICD-10-CM | POA: Diagnosis present

## 2016-11-03 DIAGNOSIS — Z931 Gastrostomy status: Secondary | ICD-10-CM

## 2016-11-03 DIAGNOSIS — I959 Hypotension, unspecified: Secondary | ICD-10-CM | POA: Diagnosis present

## 2016-11-03 DIAGNOSIS — G40409 Other generalized epilepsy and epileptic syndromes, not intractable, without status epilepticus: Secondary | ICD-10-CM | POA: Diagnosis present

## 2016-11-03 DIAGNOSIS — J189 Pneumonia, unspecified organism: Secondary | ICD-10-CM | POA: Diagnosis present

## 2016-11-03 DIAGNOSIS — E538 Deficiency of other specified B group vitamins: Secondary | ICD-10-CM | POA: Diagnosis present

## 2016-11-03 DIAGNOSIS — K5900 Constipation, unspecified: Secondary | ICD-10-CM | POA: Diagnosis present

## 2016-11-03 DIAGNOSIS — R059 Cough, unspecified: Secondary | ICD-10-CM

## 2016-11-03 DIAGNOSIS — Z79899 Other long term (current) drug therapy: Secondary | ICD-10-CM

## 2016-11-03 DIAGNOSIS — G808 Other cerebral palsy: Secondary | ICD-10-CM | POA: Diagnosis present

## 2016-11-03 DIAGNOSIS — K219 Gastro-esophageal reflux disease without esophagitis: Secondary | ICD-10-CM | POA: Diagnosis present

## 2016-11-03 DIAGNOSIS — Z888 Allergy status to other drugs, medicaments and biological substances status: Secondary | ICD-10-CM

## 2016-11-03 DIAGNOSIS — R05 Cough: Secondary | ICD-10-CM

## 2016-11-03 DIAGNOSIS — Z993 Dependence on wheelchair: Secondary | ICD-10-CM

## 2016-11-03 DIAGNOSIS — D539 Nutritional anemia, unspecified: Secondary | ICD-10-CM | POA: Diagnosis present

## 2016-11-03 DIAGNOSIS — I9589 Other hypotension: Secondary | ICD-10-CM

## 2016-11-03 DIAGNOSIS — J181 Lobar pneumonia, unspecified organism: Secondary | ICD-10-CM

## 2016-11-03 DIAGNOSIS — F73 Profound intellectual disabilities: Secondary | ICD-10-CM | POA: Diagnosis present

## 2016-11-03 DIAGNOSIS — A419 Sepsis, unspecified organism: Principal | ICD-10-CM | POA: Diagnosis present

## 2016-11-03 MED ORDER — SODIUM CHLORIDE 0.9 % IV BOLUS (SEPSIS)
1000.0000 mL | Freq: Once | INTRAVENOUS | Status: AC
  Administered 2016-11-04: 1000 mL via INTRAVENOUS

## 2016-11-03 MED ORDER — VANCOMYCIN HCL IN DEXTROSE 1-5 GM/200ML-% IV SOLN
1000.0000 mg | Freq: Once | INTRAVENOUS | Status: AC
  Administered 2016-11-04: 1000 mg via INTRAVENOUS
  Filled 2016-11-03: qty 200

## 2016-11-03 MED ORDER — PIPERACILLIN-TAZOBACTAM 3.375 G IVPB 30 MIN
3.3750 g | Freq: Once | INTRAVENOUS | Status: AC
  Administered 2016-11-04: 3.375 g via INTRAVENOUS
  Filled 2016-11-03: qty 50

## 2016-11-03 NOTE — ED Triage Notes (Signed)
Pt's mother states that she might have pneumonia. States that she is prone to pneumonia and that her symptoms of coughing and intermittent fever might be an indication of pneumonia.

## 2016-11-03 NOTE — ED Provider Notes (Signed)
By signing my name below, I, Diona Browner, attest that this documentation has been prepared under the direction and in the presence of Andreu Drudge, Layla Maw, DO. Electronically Signed: Diona Browner, ED Scribe. 11/03/16. 11:27 PM.  TIME SEEN: 11:27 PM  CHIEF COMPLAINT: Recurrent Pneumonia  HPI:  Michele [name] is a 31 y.o. female with a PMHx of cerebral palsy, mental retardation and seizures who presents to the Emergency Department complaining of a dry cough that started PTA. Per pt's mother, she thinks her daughter has pneumonia, because she is prone to the disease. Has had pneumonia in the past. Patient is wheelchair-bound. The mother notes her BM's have an unusual odor. No vomiting or diarrhea. Last seizure was the end of April. Patient noted to be hypotensive here. Mother reports systolic blood pressure is normally in the 90s.    LEVEL V CAVEAT: HPI and ROS limited due to mental retardation.  PAST MEDICAL HISTORY/PAST SURGICAL HISTORY:  Past Medical History:  Diagnosis Date  . Cerebral palsy (HCC)   . Hypothyroidism   . Lennox-Gastaut syndrome (HCC)   . Osteoporosis   . Pneumonia   . Profound mental retardation   . Reflux   . Rickets   . Seizures (HCC)     MEDICATIONS:  Prior to Admission medications   Medication Sig Start Date End Date Taking? Authorizing Provider  BANZEL 40 MG/ML SUSP TAKE 5 MLS BY MOUTH IN THE MORNING AND 5 MLS BY MOUTH AT NIGHT 06/26/16   Elveria Rising, NP  Calcium Carbonate-Vitamin D (CALCARB 600/D) 600-400 MG-UNIT per tablet Take 1 tablet by mouth 2 (two) times daily. 1 tab per tube 2 time a day    [provider]  CARNITOR 1 GM/10ML solution TAKE 5 MLS BY MOUTH THREE TIMES DAILY 06/26/16   Elveria Rising, NP  clotrimazole (LOTRIMIN) 1 % cream APPLY EXTERNALLY TO THE AFFECTED AREA TWICE DAILY 01/12/16   Delynn Flavin M, DO  DEPAKOTE SPRINKLES 125 MG capsule TAKE 8 CAPSULES BY MOUTH TWICE DAILY 06/26/16   Elveria Rising, NP  DIASTAT ACUDIAL  20 MG GEL DIAL TO 15 MG, GIVE RECTALLY FOR SEIZURES LASTING 2 MINUTES OR LONGER 06/26/16   Elveria Rising, NP  feeding supplement, ENSURE ENLIVE, (ENSURE ENLIVE) LIQD Take 237 mLs by mouth 3 (three) times daily between meals. 04/11/16   Beaulah Dinning, MD  fluticasone (FLONASE) 50 MCG/ACT nasal spray SHAKE LIQUID AND USE 2 SPRAYS IN [facility] NOSTRIL DAILY 01/12/16   Delynn Flavin M, DO  folic acid (FOLVITE) 1 MG tablet Take 1 mg by mouth every morning. 1 tab per tube daily    [provider]  furosemide (LASIX) 20 MG tablet TAKE 1 TABLET(20 MG) BY MOUTH DAILY AS NEEDED FOR FLUID RETENTION 09/27/16   Delynn Flavin M, DO  hydrocortisone cream 0.5 % Apply 1 application topically 2 (two) times daily. 10/06/15   Gottschalk, Ashly M, DO  KEPPRA 100 MG/ML solution TAKE 12 MLS BY MOUTH EVERY MORNING AND 12 MLS EVERY EVENING 06/26/16   Elveria Rising, NP  lactulose (CHRONULAC) 10 GM/15ML solution TAKE 30 ML BY MOUTH TWICE DAILY 10/27/16   Gottschalk, Ashly M, DO  NYSTATIN powder APPLY TO AFFECTED DIAPER AREAS WITH EVERY DIAPER CHANGE UNTIL RASH CLEARS 10/27/16   Delynn Flavin M, DO  ondansetron (ZOFRAN) 4 MG/5ML solution TAKE 5 MLS FOR PAIN EVERY 8 HOURS AS NEEDED FOR NAUSEA OR VOMITING 06/29/14   Hess, Bryan R, DO  PROTONIX 40 MG PACK PLACE 10 ML INTO THE FEEDING TUBE  DAILY 05/30/16   Gottschalk, Ashly M, DO  TOPAMAX SPRINKLE 25 MG capsule TAKE 6 CAPSULES BY MOUTH EVERY MORNING AND 6 CAPSULES BY MOUTH EVERY EVENING WITH FOOD 07/31/16   Delynn FlavinGottschalk, Ashly M, DO    ALLERGIES:  Allergies  Allergen Reactions  . Carbamazepine Rash    SOCIAL HISTORY:  Social History  Substance Use Topics  . Smoking status: Never Smoker  . Smokeless tobacco: Never Used  . Alcohol use No    FAMILY HISTORY: Family History  Problem Relation Age of Onset  . Hypertension Mother   . Heart disease Maternal Grandmother        Died at 2364  . Stroke Maternal Grandfather        died at 2479  . Hypertension  Paternal Grandmother   . Diabetes Unknown        MGGM  . Heart disease Unknown        MGGF  . Kidney disease Unknown        Maternal Great Aunt   . Cancer Maternal Aunt        lung  . Colon cancer Neg Hx     EXAM: Pulse (!) 56   Temp 97.7 F (36.5 C) (Axillary)   Resp 20   SpO2 100%  CONSTITUTIONAL: Alert But does not answer questions or follow commands. Does not appear toxic. HEAD: Normocephalic EYES: Conjunctivae clear, pupils appear equal, EOMI ENT: normal nose; moist mucous membranes NECK: Supple, no meningismus, no nuchal rigidity, no LAD  CARD: RRR; S1 and S2 appreciated; no murmurs, no clicks, no rubs, no gallops RESP: Normal chest excursion without splinting or tachypnea; breath sounds clear and equal bilaterally; no wheezes, no rhonchi, no rales, no hypoxia or respiratory distress, speaking full sentences ABD/GI: Normal bowel sounds; non-distended; soft, non-tender, no rebound, no guarding, no peritoneal signs, no hepatosplenomegaly BACK:  The back appears normal and is non-tender to palpation, there is no CVA tenderness EXT: Normal ROM in all joints; non-tender to palpation; no edema; normal capillary refill; no cyanosis, no calf tenderness or swelling    SKIN: Normal color for age and race; warm; no rash NEURO: Moves all extremities equally   MEDICAL DECISION MAKING: The patient here with cough and concerns for pneumonia. She is hypotensive here. Does not appear toxic however. We'll give IV fluids, obtain labs, cultures, chest x-ray, urinalysis. Anticipated admission.  ED PROGRESS: Blood pressure checked several times on both arms and was in the 70s systolic. Improving with IV hydration. Labs were unremarkable with no leukocytosis and normal lactate. Chest x-ray shows patchy opacity within the left lower lobe likely representing infiltrate.  Patient receiving broad-spectrum antibiotics already for possible sepsis given hypotension. We'll discuss with medicine for  admission.  [facility].  Nursing staff unable to obtain second blood culture. They are already infusing IV antibiotics.   1:45 AM  Pt's MAPs 65 and greater now.  Patient smiling with mother. In no distress. Nontoxic. Discussed with family medicine resident for admission to [facility] step[facility]. She will place admission orders. We have confirmed that there are stepdown beds available. Patient's mother has been updated with this plan.  I reviewed all nursing notes, vitals, pertinent previous records, EKGs, lab and urine results, imaging (as available).    EKG Interpretation  Date/Time:  Saturday Nov 04 2016 00:21:24 EDT Ventricular Rate:  84 PR Interval:    QRS Duration: 81 QT Interval:  361 QTC Calculation: 427 R Axis:   37 Text  Interpretation:  Sinus rhythm Borderline short PR interval Borderline Q waves in inferior leads Nonspecific repol abnormality, inferior leads Borderline ST elevation, lateral leads Baseline wander in lead(s) III aVF No significant change since last tracing Confirmed by Fields Oros,  DO, Kylar Speelman ([id number]) on 11/04/2016 1:30:55 AM         CRITICAL CARE Performed by: Raelyn Number   Total critical care time: 45 minutes  Critical care time was exclusive of separately billable procedures and treating other patients.  Critical care was necessary to treat or prevent imminent or life-threatening deterioration.  Critical care was time spent personally by me on the following activities: development of treatment plan with patient and/or surrogate as well as nursing, discussions with consultants, evaluation of patient's response to treatment, examination of patient, obtaining history from patient or surrogate, ordering and performing treatments and interventions, ordering and review of laboratory studies, ordering and review of radiographic studies, pulse oximetry and re-evaluation of patient's condition.   I personally performed the services described  in this documentation, which was scribed in my presence. The recorded information has been reviewed and is accurate.     Suheyla Mortellaro, Layla Maw, DO 11/04/16 [phone]

## 2016-11-04 ENCOUNTER — Emergency Department (HOSPITAL_COMMUNITY): Payer: Medicaid Other

## 2016-11-04 ENCOUNTER — Inpatient Hospital Stay (HOSPITAL_COMMUNITY): Payer: Medicaid Other

## 2016-11-04 ENCOUNTER — Encounter (HOSPITAL_COMMUNITY): Payer: Self-pay | Admitting: Internal Medicine

## 2016-11-04 DIAGNOSIS — K219 Gastro-esophageal reflux disease without esophagitis: Secondary | ICD-10-CM | POA: Diagnosis present

## 2016-11-04 DIAGNOSIS — A419 Sepsis, unspecified organism: Principal | ICD-10-CM

## 2016-11-04 DIAGNOSIS — G40812 Lennox-Gastaut syndrome, not intractable, without status epilepticus: Secondary | ICD-10-CM | POA: Diagnosis present

## 2016-11-04 DIAGNOSIS — Z79899 Other long term (current) drug therapy: Secondary | ICD-10-CM | POA: Diagnosis not present

## 2016-11-04 DIAGNOSIS — G808 Other cerebral palsy: Secondary | ICD-10-CM | POA: Diagnosis present

## 2016-11-04 DIAGNOSIS — J181 Lobar pneumonia, unspecified organism: Secondary | ICD-10-CM

## 2016-11-04 DIAGNOSIS — E538 Deficiency of other specified B group vitamins: Secondary | ICD-10-CM | POA: Diagnosis present

## 2016-11-04 DIAGNOSIS — Z993 Dependence on wheelchair: Secondary | ICD-10-CM | POA: Diagnosis not present

## 2016-11-04 DIAGNOSIS — J189 Pneumonia, unspecified organism: Secondary | ICD-10-CM | POA: Diagnosis present

## 2016-11-04 DIAGNOSIS — R0600 Dyspnea, unspecified: Secondary | ICD-10-CM

## 2016-11-04 DIAGNOSIS — F73 Profound intellectual disabilities: Secondary | ICD-10-CM | POA: Diagnosis present

## 2016-11-04 DIAGNOSIS — G40409 Other generalized epilepsy and epileptic syndromes, not intractable, without status epilepticus: Secondary | ICD-10-CM | POA: Diagnosis present

## 2016-11-04 DIAGNOSIS — R05 Cough: Secondary | ICD-10-CM | POA: Diagnosis not present

## 2016-11-04 DIAGNOSIS — I9589 Other hypotension: Secondary | ICD-10-CM | POA: Diagnosis not present

## 2016-11-04 DIAGNOSIS — Z931 Gastrostomy status: Secondary | ICD-10-CM | POA: Diagnosis not present

## 2016-11-04 DIAGNOSIS — Z888 Allergy status to other drugs, medicaments and biological substances status: Secondary | ICD-10-CM | POA: Diagnosis not present

## 2016-11-04 DIAGNOSIS — D539 Nutritional anemia, unspecified: Secondary | ICD-10-CM | POA: Diagnosis present

## 2016-11-04 DIAGNOSIS — K5900 Constipation, unspecified: Secondary | ICD-10-CM | POA: Diagnosis present

## 2016-11-04 DIAGNOSIS — I959 Hypotension, unspecified: Secondary | ICD-10-CM | POA: Diagnosis present

## 2016-11-04 LAB — CBC WITH DIFFERENTIAL/PLATELET
BASOS PCT: 0 %
Basophils Absolute: 0 10*3/uL (ref 0.0–0.1)
Eosinophils Absolute: 0.1 10*3/uL (ref 0.0–0.7)
Eosinophils Relative: 1 %
HEMATOCRIT: 34.2 % — AB (ref 36.0–46.0)
HEMOGLOBIN: 10.6 g/dL — AB (ref 12.0–15.0)
LYMPHS ABS: 4.7 10*3/uL — AB (ref 0.7–4.0)
Lymphocytes Relative: 46 %
MCH: 32.6 pg (ref 26.0–34.0)
MCHC: 31 g/dL (ref 30.0–36.0)
MCV: 105.2 fL — ABNORMAL HIGH (ref 78.0–100.0)
MONOS PCT: 11 %
Monocytes Absolute: 1.1 10*3/uL — ABNORMAL HIGH (ref 0.1–1.0)
NEUTROS ABS: 4.2 10*3/uL (ref 1.7–7.7)
NEUTROS PCT: 42 %
Platelets: 228 10*3/uL (ref 150–400)
RBC: 3.25 MIL/uL — ABNORMAL LOW (ref 3.87–5.11)
RDW: 17.3 % — ABNORMAL HIGH (ref 11.5–15.5)
WBC: 10 10*3/uL (ref 4.0–10.5)

## 2016-11-04 LAB — COMPREHENSIVE METABOLIC PANEL
ALBUMIN: 3.1 g/dL — AB (ref 3.5–5.0)
ALK PHOS: 90 U/L (ref 38–126)
ALT: 11 U/L — AB (ref 14–54)
ANION GAP: 9 (ref 5–15)
AST: 27 U/L (ref 15–41)
BILIRUBIN TOTAL: 0.3 mg/dL (ref 0.3–1.2)
BUN: 29 mg/dL — ABNORMAL HIGH (ref 6–20)
CALCIUM: 9.3 mg/dL (ref 8.9–10.3)
CO2: 30 mmol/L (ref 22–32)
CREATININE: 0.96 mg/dL (ref 0.44–1.00)
Chloride: 106 mmol/L (ref 101–111)
GFR calc Af Amer: >60 mL/min (ref >60)
GFR calc non Af Amer: >60 mL/min (ref >60)
GLUCOSE: 98 mg/dL (ref 65–99)
Potassium: 4.6 mmol/L (ref 3.5–5.1)
Sodium: 145 mmol/L (ref 135–145)
TOTAL PROTEIN: 7.3 g/dL (ref 6.5–8.1)

## 2016-11-04 LAB — MRSA PCR SCREENING: MRSA by PCR: NEGATIVE

## 2016-11-04 LAB — ECHOCARDIOGRAM COMPLETE
Height: 59 in
Weight: 3248.7 oz

## 2016-11-04 LAB — I-STAT CG4 LACTIC ACID, ED: LACTIC ACID, VENOUS: 1.1 mmol/L (ref 0.5–1.9)

## 2016-11-04 LAB — VALPROIC ACID LEVEL: Valproic Acid Lvl: 87 ug/mL (ref 50.0–100.0)

## 2016-11-04 MED ORDER — SODIUM CHLORIDE 0.9% FLUSH
3.0000 mL | Freq: Two times a day (BID) | INTRAVENOUS | Status: DC
  Administered 2016-11-04 – 2016-11-06 (×4): 3 mL via INTRAVENOUS

## 2016-11-04 MED ORDER — DIAZEPAM 10 MG RE GEL: 15.0000 mg | RECTAL | Status: DC | PRN

## 2016-11-04 MED ORDER — LEVETIRACETAM 100 MG/ML PO SOLN
1200.0000 mg | Freq: Two times a day (BID) | ORAL | Status: DC
  Administered 2016-11-04 – 2016-11-07 (×7): 1200 mg via ORAL
  Filled 2016-11-04 (×7): qty 15

## 2016-11-04 MED ORDER — ENSURE ENLIVE PO LIQD
237.0000 mL | Freq: Three times a day (TID) | ORAL | Status: DC
  Administered 2016-11-04 – 2016-11-06 (×7): 237 mL via ORAL

## 2016-11-04 MED ORDER — CALCIUM CARBONATE-VITAMIN D 500-200 MG-UNIT PO TABS
1.0000 | ORAL_TABLET | Freq: Two times a day (BID) | ORAL | Status: DC
  Administered 2016-11-04 – 2016-11-07 (×6): 1 via ORAL
  Filled 2016-11-04 (×6): qty 1

## 2016-11-04 MED ORDER — TOPIRAMATE 25 MG PO CPSP
150.0000 mg | ORAL_CAPSULE | Freq: Two times a day (BID) | ORAL | Status: DC
  Administered 2016-11-04 – 2016-11-07 (×7): 150 mg via ORAL
  Filled 2016-11-04 (×7): qty 6

## 2016-11-04 MED ORDER — SODIUM CHLORIDE 0.9 % IV SOLN: INTRAVENOUS | Status: AC

## 2016-11-04 MED ORDER — CEFTRIAXONE SODIUM 1 G IJ SOLR: 1.0000 g | Freq: Once | INTRAMUSCULAR | Status: DC

## 2016-11-04 MED ORDER — SODIUM CHLORIDE 0.9 % IV SOLN
1250.0000 mg | Freq: Two times a day (BID) | INTRAVENOUS | Status: DC
  Administered 2016-11-04 – 2016-11-06 (×4): 1250 mg via INTRAVENOUS
  Filled 2016-11-04 (×5): qty 1250

## 2016-11-04 MED ORDER — LACTULOSE 10 GM/15ML PO SOLN
20.0000 g | Freq: Two times a day (BID) | ORAL | Status: DC
  Administered 2016-11-05 – 2016-11-06 (×4): 20 g via ORAL
  Filled 2016-11-04 (×6): qty 30

## 2016-11-04 MED ORDER — FOLIC ACID 1 MG PO TABS
1.0000 mg | ORAL_TABLET | Freq: Every morning | ORAL | Status: DC
  Administered 2016-11-04 – 2016-11-07 (×4): 1 mg via ORAL
  Filled 2016-11-04 (×4): qty 1

## 2016-11-04 MED ORDER — PERFLUTREN LIPID MICROSPHERE
1.0000 mL | INTRAVENOUS | Status: AC | PRN
  Administered 2016-11-04: 2 mL via INTRAVENOUS
  Filled 2016-11-04: qty 10

## 2016-11-04 MED ORDER — RUFINAMIDE 40 MG/ML PO SUSP
200.0000 mg | Freq: Two times a day (BID) | ORAL | Status: DC
  Administered 2016-11-04 – 2016-11-07 (×7): 200 mg via ORAL
  Filled 2016-11-04 (×7): qty 5

## 2016-11-04 MED ORDER — FLUTICASONE PROPIONATE 50 MCG/ACT NA SUSP
2.0000 | Freq: Every day | NASAL | Status: DC
  Administered 2016-11-04 – 2016-11-06 (×3): 2 via NASAL
  Filled 2016-11-04: qty 16

## 2016-11-04 MED ORDER — PANTOPRAZOLE SODIUM 40 MG PO PACK
40.0000 mg | PACK | Freq: Every day | ORAL | Status: DC
  Administered 2016-11-04 – 2016-11-07 (×4): 40 mg
  Filled 2016-11-04 (×4): qty 20

## 2016-11-04 MED ORDER — DEXTROSE 5 % IV SOLN: 500.0000 mg | Freq: Once | INTRAVENOUS | Status: DC

## 2016-11-04 MED ORDER — ENOXAPARIN SODIUM 40 MG/0.4ML ~~LOC~~ SOLN
40.0000 mg | SUBCUTANEOUS | Status: DC
  Administered 2016-11-04 – 2016-11-06 (×3): 40 mg via SUBCUTANEOUS
  Filled 2016-11-04 (×3): qty 0.4

## 2016-11-04 MED ORDER — DIVALPROEX SODIUM 125 MG PO CSDR
1000.0000 mg | DELAYED_RELEASE_CAPSULE | Freq: Two times a day (BID) | ORAL | Status: DC
  Administered 2016-11-04 – 2016-11-07 (×7): 1000 mg via ORAL
  Filled 2016-11-04 (×7): qty 8

## 2016-11-04 MED ORDER — LEVOCARNITINE 1 GM/10ML PO SOLN
500.0000 mg | Freq: Three times a day (TID) | ORAL | Status: DC
  Administered 2016-11-04 – 2016-11-07 (×10): 500 mg via ORAL
  Filled 2016-11-04 (×11): qty 5

## 2016-11-04 MED ORDER — PIPERACILLIN-TAZOBACTAM 3.375 G IVPB
3.3750 g | Freq: Three times a day (TID) | INTRAVENOUS | Status: DC
  Administered 2016-11-04 – 2016-11-06 (×7): 3.375 g via INTRAVENOUS
  Filled 2016-11-04 (×9): qty 50

## 2016-11-04 NOTE — Progress Notes (Signed)
FPTS Transfer Note:  Paged at [phone] from [facility] [name]. After discussing the patient's presentation (hypotensive with response to fluids and CXR suggestive of PNA), we agreed she would be appropriate for stepdown. I suggested that we don't transfer unstable patients, but Dr. Elesa MassedWard agreed to monitor patient until transfer and felt she was stable for transfer. She reported that we have stepdown beds available. Placed admissions orders and will evaluate the patient on arrival. Please page [phone] on [facility].   Loni MuseKate Daelyn Mozer, MD

## 2016-11-04 NOTE — H&P (Signed)
Family Medicine Teaching [facility] Admission History and Physical Service Pager: [phone]  Patient name: Michele [name] Medical record number: [id number] Date of birth: [birth year] Age: 31 y.o. Gender: female  Primary Care Provider: Raliegh Ip, DO Consultants: none Code Status: FULL  Chief Complaint: cough, intermittent fever  Assessment and Plan: ERIE SICA is a 31 y.o. female presenting with coughing, intermittent fever . PMH is significant for  infantile cerebral palsy, seizure disorder, profound intellectual disability.  Sepsis: BIB by mom due to cough and c/w pneumonia given history of same. Afebrile (lowest recorded temp 97.7), hypotensive at 70/40 on initial presentation. No leukocytosis. BP rapidly responsive to 3L NS bolus. Mom notes a cough x 1-2 weeks. Brought her in last night because cough was worsening. Mom thinks maybe some decreased activity from baseline, no irregular temps at home. Denies urinary or bowel changes. Mostly likely sepsis 2/2 respiratory source given CXR findings.  -admit to stepdown, Dr. Pollie Meyer attending -vitals q4 hours -continue vanc, zosyn, consider narrowing after 24 hours of blood cultures -monitor blood cultures -urine culture ordered, unable to obtain yet -AM CBC, BMP  Infantile cerebral palsy/intellectual disability:  completely dependent upon mother for ADLs. Lower extremity edema, non-pitting. Lasix qday or every other day.  - OOB with assistance only  LE swelling: on PRN Lasix at home for edema. Never had an echo. 1+ swelling noted on exam. No crackles on lung exam, doubt fluid overload. If BP continues to remain >100 systolic, would consider dosing home lasix given copious fluids she has received.  - holding home PRN Lasix given hypotension. - We will obtain Echo  Constipation:  - continue home lactulose  Seizure disorder: takes Depakote 1000mg  BID. Keppra 1200mg  BID. Topamax 150mg  BID, Carnitor 0.25mh TID, Banzel susp 5mg   BID. Diastat prn rectal. Depakote level normal at 87.  - continue home regimen  GERD: on Protonix 10cc daily  - Continue Protonix   FEN/GI: heart healthy diet/ protonix Prophylaxis: lovenox  Disposition: admit to stepdown, Dr. Pollie Meyer attending  History of Present Illness:  Michele [name] is a 31 y.o. female presenting with 1-2 weeks of cough, vitals initially suggestive of sepsis. Mom was concerned with pneumonia when she brought her in, has a history of the same. Mom states that she may be a little bit more sleepy than normal over the past week, but this has not been concerning to mom. Notably, at baseline she is minimally verbal and gestures for any discomfort. Mom says that she has a pretty picky eater, but has been eating normally lately. She uses booster in chair when Lacreshia does not want to eat. Geralda takes everything by mouth. Mom denies any low or high temperatures at home over the past week (in the past patient has presented with hypothermia in acute illness). Mom reports that they went to [facility] because she thought they would send her home from the [facility] emergency room. Mom reports that normal blood pressures for Kambrey are 90s over 70s.   In [facility], could sepsis was initiated due to low blood pressures. Due to poor veins, only one blood culture and IV were obtained. Chest x-ray with concern for pneumonia, no temperature instability. Given hypotension, was covered with broad antibiotics in the form of vancomycin and Zosyn. Blood pressure rapidly responded to 3 L of normal saline.  Review Of Systems: Per HPI with the following additions:   Review of Systems  Constitutional: Positive for malaise/fatigue. Negative for chills and fever.  Respiratory: Positive  for cough.   Gastrointestinal: Negative for abdominal pain and constipation.  Genitourinary: Negative for dysuria and urgency.  Skin: Negative for rash.  Neurological: Negative for seizures and headaches.    Patient Active  Problem List   Diagnosis Date Noted  . Sepsis (HCC) 11/04/2016  . Other constipation   . Unsteadiness on feet   . Rhinitis, non-allergic 06/23/2015  . Special screening examination for human papillomavirus (HPV) 07/20/2014  . Amenorrhea 05/19/2014  . 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 Long-term use of high-risk medication 12/24/2012  . Morbid obesity (HCC) 12/24/2012  . 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 URINARY INCONTINENCE 02/25/2010  . 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 Hypothyroidism 01/29/2009  . 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 Infantile cerebral palsy (HCC) 01/29/2009  . GASTROESOPHAGEAL REFLUX DISEASE 01/29/2009  . Osteoporosis 01/29/2009  . Seizure disorder (HCC) 01/29/2009    Past Medical History: Past Medical History:  Diagnosis Date  . Cerebral palsy (HCC)   . Hypothyroidism   . Lennox-Gastaut syndrome (HCC)   . Osteoporosis   . Pneumonia   . Profound mental retardation   . Reflux   . Rickets   . Seizures (HCC)     Past Surgical History: Past Surgical History:  Procedure Laterality Date  . FOOT MASS EXCISION Right   . Gastrocutaneous fistual repair  2012  . PEG TUBE PLACEMENT      Social History: Social History  Substance Use Topics  . Smoking status: Never Smoker  . Smokeless tobacco: Never Used  . Alcohol use No   Additional social history: lives with mom and sister.   Please also refer to relevant sections of EMR.  Family History: Family History  Problem Relation Age of Onset  . Hypertension Mother   . Heart disease Maternal Grandmother        Died at 47  . Stroke Maternal Grandfather        died at 48  . Hypertension Paternal Grandmother   . Diabetes Unknown        MGGM  . Heart disease Unknown        MGGF  . Kidney disease Unknown        Maternal Great Aunt   . Cancer Maternal Aunt        lung  . Colon cancer  Neg Hx     Allergies and Medications: Allergies  Allergen Reactions  . Carbamazepine Rash   No current facility-administered medications on file prior to encounter.    Current Outpatient Prescriptions on File Prior to Encounter  Medication Sig Dispense Refill  . BANZEL 40 MG/ML SUSP TAKE 5 MLS BY MOUTH IN THE MORNING AND 5 MLS BY MOUTH AT NIGHT 460 mL 5  . Calcium Carbonate-Vitamin D (CALCARB 600/D) 600-400 MG-UNIT per tablet Take 1 tablet by mouth 2 (two) times daily. 1 tab per tube 2 time a day    . CARNITOR 1 GM/10ML solution TAKE 5 MLS BY MOUTH THREE TIMES DAILY 510 mL 5  . clotrimazole (LOTRIMIN) 1 % cream APPLY EXTERNALLY TO THE AFFECTED AREA TWICE DAILY 60 g 0  . DEPAKOTE SPRINKLES 125 MG capsule TAKE 8 CAPSULES BY MOUTH TWICE DAILY (Patient taking differently: Take 1,000 mg by mouth 2 (two) times daily. TAKE 8 CAPSULES BY MOUTH TWICE DAILY) 496 capsule 5  . DIASTAT ACUDIAL 20 MG GEL DIAL TO 15 MG, GIVE RECTALLY FOR SEIZURES LASTING 2 MINUTES OR LONGER 2 Package  5  . feeding supplement, ENSURE ENLIVE, (ENSURE ENLIVE) LIQD Take 237 mLs by mouth 3 (three) times daily between meals. 237 mL 12  . fluticasone (FLONASE) 50 MCG/ACT nasal spray SHAKE LIQUID AND USE 2 SPRAYS IN EACH NOSTRIL DAILY 16 g 5  . folic acid (FOLVITE) 1 MG tablet Take 1 mg by mouth every morning. 1 tab per tube daily    . furosemide (LASIX) 20 MG tablet TAKE 1 TABLET(20 MG) BY MOUTH DAILY AS NEEDED FOR FLUID RETENTION 90 tablet 0  . hydrocortisone cream 0.5 % Apply 1 application topically 2 (two) times daily. 60 g 0  . KEPPRA 100 MG/ML solution TAKE 12 MLS BY MOUTH EVERY MORNING AND 12 MLS EVERY EVENING 816 mL 5  . lactulose (CHRONULAC) 10 GM/15ML solution TAKE 30 ML BY MOUTH TWICE DAILY 5492 mL 0  . NYSTATIN powder APPLY TO AFFECTED DIAPER AREAS WITH EVERY DIAPER CHANGE UNTIL RASH CLEARS 60 g 0  . ondansetron (ZOFRAN) 4 MG/5ML solution TAKE 5 MLS FOR PAIN EVERY 8 HOURS AS NEEDED FOR NAUSEA OR VOMITING 50 mL 11  .  PROTONIX 40 MG PACK PLACE 10 ML INTO THE FEEDING TUBE DAILY 30 each 5  . TOPAMAX SPRINKLE 25 MG capsule TAKE 6 CAPSULES BY MOUTH EVERY MORNING AND 6 CAPSULES BY MOUTH EVERY EVENING WITH FOOD (Patient taking differently: TAKE 150 MG BY MOUTH EVERY MORNING AND EVENING WITH FOOD) 408 capsule 0    Objective: BP (!) 87/57   Pulse 78   Temp 98 F (36.7 C) (Rectal)   Resp 19   SpO2 96%  Exam: General: Sleepy but arousable female lying in bed in NAD. Later alert and awake on my exam Eyes: EOMI, PEERLA ENTM: patient uncooperative with oral exam Neck: supple, no JVD Cardiovascular: RRR, no murmur Respiratory: CTAB, no wheezes or crackles  Gastrointestinal: SNTND, +BS MSK: 1+ edema to shin, nontender Derm: no rashes or wounds on visualized skin Neuro: alert, tracks with eyes, moves all extremities. Patient can follow commands, no verbalizations on my exam Psych: mood and affect appropriate  Labs and Imaging: CBC BMET   Recent Labs Lab 11/03/16 2358  WBC 10.0  HGB 10.6*  HCT 34.2*  PLT 228    Recent Labs Lab 11/03/16 2358  NA 145  K 4.6  CL 106  CO2 30  BUN 29*  CREATININE 0.96  GLUCOSE 98  CALCIUM 9.3     Dg Chest Port 1 View  Result Date: 11/04/2016 CLINICAL DATA:  Initial evaluation for acute cough. History of pneumonia. EXAM: PORTABLE CHEST 1 VIEW COMPARISON:  Prior radiograph from 08/15/2016. FINDINGS: Stable cardiomegaly.  Mediastinal silhouette unchanged. Lungs hypoinflated. Mild diffuse vascular congestion without frank pulmonary edema. Patchy opacity overlies the retrocardiac left lower lobe, which may reflect atelectasis or infiltrate. No other definite focal airspace disease. No obvious pleural effusion. No pneumothorax. No acute osseus abnormality. IMPRESSION: 1. Patchy opacity within the retrocardiac left lower lobe, which may reflect atelectasis or infiltrate. 2. Cardiomegaly with mild diffuse pulmonary vascular congestion without frank pulmonary edema.  Electronically Signed   By: Rise Mu M.D.   On: 11/04/2016 00:24   Xr Femur, Min 2 Views Right  Result Date: 10/23/2016 AP lateral tibia x-rays are obtained and reviewed. This shows well located hip joint. Mild bowing of the femur most noted on lateral x-ray. Negative for acute fracture. Impression: negative femur x-rays for fracture that would explain the apparent pain with weightbearing.  Xr Foot 2 Views Right  Result Date: 10/23/2016  Two-view x-rays right foot obtained which shows pes planus deformity. No acute fracture is noted. Metatarsus abductus with hallux valgus noted. Generalized osteopenia. Periosteal new bone formation seen along the tibia room early posteriorly. Impression: Negative foot x-rays for fracture. Again seen is the tibial periosteal new bone formation.  Xr Tibia/fibula Right  Result Date: 10/23/2016 AP and lateral tibial x-rays are obtained. This shows that the tibia the bones laterally with the apex at the mid tibia region. There is extensive periosteal new bone formation suggesting healing fracture extending from the mid tibia almost to the ankle joint. No nidus is seen that would suggest osteomyelitis. Impression: Healing tibial shaft fracture. This possibly could represent a reaction to infection. No area of bone destruction is noted.    Garth Bignessimberlake, Kathryn, MD 11/04/2016, 2:00 AM PGY-1, [facility] FPTS Intern pager: [phone], text pages welcome   Upper Level Addendum:  I have seen and evaluated this patient along with Dr. Chanetta Marshallimberlake and reviewed the above note, making necessary revisions in pink.  Erasmo DownerBacigalupo, Angela M, MD, MPH PGY-3,  [facility] 11/04/2016 7:29 AM

## 2016-11-04 NOTE — ED Notes (Signed)
Attempted I&O cath for urine, unable to get sample.  Will attempt again later.

## 2016-11-04 NOTE — Progress Notes (Signed)
Pharmacy Antibiotic Note  Michele [name] is a 31 y.o. female with cerebral palsy who is wheelchair bound admitted on 11/03/2016 with sepsis.  Pharmacy has been consulted for zosyn/vancomycin dosing.   Plan: Continue Zosyn 3.375g IV q8h (4 hour infusion).  Vancomycin 1250mg  IV q12h Will f/u micro data, renal function, and pt's clinical condition Vanc trough at Css in wheelchair bound patient   Height: 4\' 11"  (149.9 cm) Weight: 203 lb 0.7 oz (92.1 kg) IBW/kg (Calculated) : 43.2  Temp (24hrs), Avg:98.1 F (36.7 C), Min:97.4 F (36.3 C), Max:99.2 F (37.3 C)   Recent Labs Lab 11/03/16 2358 11/04/16 0012  WBC 10.0  --   CREATININE 0.96  --   LATICACIDVEN  --  1.10    Estimated Creatinine Clearance: 85 mL/min (by C-G formula based on SCr of 0.96 mg/dL).    Allergies  Allergen Reactions  . Carbamazepine Rash    Antimicrobials this admission: 5/19 zosyn >>  5/19 vancomycin >>   Dose adjustments this admission: N/a  Microbiology results: 5/19 BCx x2:   UCx:    MRSA PCR:   Thank you for allowing pharmacy to be a part of this patient's care.  Lavonia Danamend, Toure Edmonds George 11/04/2016 6:21 AM

## 2016-11-04 NOTE — Progress Notes (Signed)
  Echocardiogram 2D Echocardiogram with definity has been performed.  Michele [name] 11/04/2016, 9:18 AM

## 2016-11-04 NOTE — ED Notes (Signed)
Patient with very poor peripheral veins-one IV started with just one set blood cultures. Patient is awake and alert with skin cool and dry and patient is in no distress at this time. Mother at bedside.

## 2016-11-04 NOTE — Plan of Care (Signed)
Problem: Education: Goal: Knowledge of [place] [facility] Education information/materials will improve Outcome: Progressing Patient's mother oriented to unit and hospital. Instructed on use of call light and discussed plan of care. Patient's mom also present for discussion with admitting MD.   Problem: Safety: Goal: Ability to remain free from injury will improve Outcome: Progressing 4 Side rails up at patient's mother request for safety. Patient able to roll around in bed.   Problem: Physical Regulation: Goal: Ability to maintain clinical measurements within normal limits will improve Outcome: Progressing Patient back to baseline BP at this time. Will monitor for hypotension. Coughing but O2 Saturation okay on RA at this time.   Problem: Skin Integrity: Goal: Risk for impaired skin integrity will decrease Outcome: Progressing Encouraging patient to turn and move. Turns self somewhat. Patient's mother requests use of brief for incontinence, but there is no skin breakdown at this time. Will follow and monitor closely to prevent new problems.  Problem: Tissue Perfusion: Goal: Risk factors for ineffective tissue perfusion will decrease Outcome: Progressing Lovenox for VTE ordered.

## 2016-11-04 NOTE — Progress Notes (Signed)
Pharmacy Antibiotic Note  Michele PineRobin C [name] is a 31 y.o. female admitted on 11/03/2016 with sepsis.  Pharmacy has been consulted for zosyn/vancomycin dosing.  Plan: Zosyn 3.375g IV q8h (4 hour infusion).  Vancomycin 1 Gm x1 waiting for weight to order maintenance dose Daily Scr F/u cultures/levels     Temp (24hrs), Avg:97.7 F (36.5 C), Min:97.7 F (36.5 C), Max:97.7 F (36.5 C)  No results for input(s): WBC, CREATININE, LATICACIDVEN, VANCOTROUGH, VANCOPEAK, VANCORANDOM, GENTTROUGH, GENTPEAK, GENTRANDOM, TOBRATROUGH, TOBRAPEAK, TOBRARND, AMIKACINPEAK, AMIKACINTROU, AMIKACIN in the last 168 hours.  CrCl cannot be calculated (Patient's most recent lab result is older than the maximum 21 days allowed.).    Allergies  Allergen Reactions  . Carbamazepine Rash    Antimicrobials this admission: 5/19 zosyn >>  5/19 vancomycin >>   Dose adjustments this admission:   Microbiology results:  BCx:   UCx:    Sputum:    MRSA PCR:   Thank you for allowing pharmacy to be a part of this patient's care.  Lorenza EvangelistGreen, Karyl Sharrar R 11/04/2016 12:09 AM

## 2016-11-04 NOTE — Progress Notes (Signed)
Difficulty getting patient to keep arm straight for IV fluids. Per MD, okay to SL patient until better access can be obtained in morning.   Noe GensStefanie A Riniyah Speich, RN

## 2016-11-04 NOTE — ED Notes (Signed)
Unable to get second BC at this time.

## 2016-11-04 NOTE — ED Notes (Signed)
Writer attempted for an In and Out cath, unsuccessful X 1

## 2016-11-05 LAB — BASIC METABOLIC PANEL
Anion gap: 5 (ref 5–15)
BUN: 18 mg/dL (ref 6–20)
CHLORIDE: 113 mmol/L — AB (ref 101–111)
CO2: 24 mmol/L (ref 22–32)
Calcium: 8.4 mg/dL — ABNORMAL LOW (ref 8.9–10.3)
Creatinine, Ser: 0.8 mg/dL (ref 0.44–1.00)
GFR calc Af Amer: >60 mL/min (ref >60)
GFR calc non Af Amer: >60 mL/min (ref >60)
Glucose, Bld: 89 mg/dL (ref 65–99)
Potassium: 4.4 mmol/L (ref 3.5–5.1)
SODIUM: 142 mmol/L (ref 135–145)

## 2016-11-05 LAB — CBC
HCT: 32.7 % — ABNORMAL LOW (ref 36.0–46.0)
Hemoglobin: 9.5 g/dL — ABNORMAL LOW (ref 12.0–15.0)
MCH: 30.6 pg (ref 26.0–34.0)
MCHC: 29.1 g/dL — ABNORMAL LOW (ref 30.0–36.0)
MCV: 105.5 fL — AB (ref 78.0–100.0)
PLATELETS: 197 10*3/uL (ref 150–400)
RBC: 3.1 MIL/uL — ABNORMAL LOW (ref 3.87–5.11)
RDW: 17.3 % — AB (ref 11.5–15.5)
WBC: 9 10*3/uL (ref 4.0–10.5)

## 2016-11-05 MED ORDER — SODIUM CHLORIDE 0.9 % IV SOLN
INTRAVENOUS | Status: DC
  Administered 2016-11-05 – 2016-11-07 (×4): via INTRAVENOUS

## 2016-11-05 MED ORDER — SODIUM CHLORIDE 0.9 % IV BOLUS (SEPSIS)
500.0000 mL | Freq: Once | INTRAVENOUS | Status: AC
  Administered 2016-11-05: 500 mL via INTRAVENOUS

## 2016-11-05 NOTE — Plan of Care (Signed)
Problem: Bowel/Gastric: Goal: Will not experience complications related to bowel motility Outcome: Progressing Patient has not had a bowel movement since 5/18- per mother patient takes lactulose BID to remain regular. Order added to patient medications. Patient will have with morning medications.

## 2016-11-05 NOTE — Progress Notes (Signed)
Family Medicine Teaching Service Daily Progress Note Intern Pager: [phone]  Patient name: Michele [name] Medical record number: [id number] Date of birth: 09/05/1985 Age: 31 y.o. Gender: female  Primary Care Provider: Raliegh IpGottschalk, Ashly M, DO Consultants: None  Code Status: Full   Pt Overview and Major Events to Date:  5/19: Admit for IV abx  Assessment and Plan: Michele [name] is a 31 y.o. female presenting with coughing, intermittent fever . PMH is significant for infantile cerebral palsy, seizure disorder, profound intellectual disability.  Sepsis 2/2 Left lower lobe pneumonia: Afebrile but with low recorded temps (as low as 96.56F), remains hypotensive s/p 3 L NS bolus yesterday. with slightly tachypnea this morning. No O2 requirement.  -Continue to monitor in SDU -Vitals per floor protocol -Continue vanc + zosyn, day 2 -Urine and blood cultures pending, can transition to PO abx to cover for CAP once these are negative x 48 hours -AM CBC, BMP still pending   Hypotension: BP 82/54 this morning. Patient unable to endorse symptoms due to intellectual disability. Assuming this is likely from sepsis. Not on any diuretics or antihypertensives. Only 1 documented meal intake in last 24 hours, ?decreased PO intake. - Will give 500cc bolus now - Can given another 500cc bolus if BP does not improve - Continue to hold home Lasix  Infantile cerebral palsy/intellectual disability:completely dependent upon mother for ADLs. Lower extremity edema, non-pitting. Lasix qday or every other day.  - OOB with assistance only  LE swelling:on PRN Lasix at home for edema. 1+ swelling noted on admission but no crackles on lung exam, doubt fluid overload. Echo yesterday completely normal.   - holding home PRN Lasix given hypotension.  Constipation:  - continue home lactulose  Seizure disorder: takes Depakote 1000mg  BID. Keppra 1200mg  BID. Topamax 150mg  BID, Carnitor 0.755mh TID, Banzel susp 5mg  BID.  Diastat prn rectal. Depakote level normal at 87.  - continue home regimen  GERD: on Protonix 10cc daily  - Continue Protonix   FEN/GI: heart healthy diet/ protonix Prophylaxis: lovenox   Disposition: Continue to monitor in SDU until hypotension improves  Subjective:  Patient had no acute events overnight. Mother at bedside. She thinks patient is doing better and swelling has improved. She ate full breakfast this morning. Has been urinating appropriately per mom.   Objective: Temp:  [96.2 F (35.7 C)-97.6 F (36.4 C)] 96.9 F (36.1 C) (05/20 0352) Pulse Rate:  [60-93] 71 (05/20 0500) Resp:  [13-24] 21 (05/20 0600) BP: (79-116)/(51-79) 82/54 (05/20 0600) SpO2:  [95 %-100 %] 99 % (05/20 0500) Physical Exam: General: In no acute distress, laying in bed watching TV, alert but nonverbal Cardiovascular: Regular rate and rhythm, no murmurs appreciated  Respiratory: Normal work of breathing, no wheezes or crackles initiated Abdomen: Soft, nondistended, nontender, normal bowel sounds Extremities: 1+ pitting edema from shin down in bilateral lower extremities, trace edema in bilateral hands. Moves all extremities spontaneously.  Laboratory:  Recent Labs Lab 11/03/16 2358  WBC 10.0  HGB 10.6*  HCT 34.2*  PLT 228    Recent Labs Lab 11/03/16 2358  NA 145  K 4.6  CL 106  CO2 30  BUN 29*  CREATININE 0.96  CALCIUM 9.3  PROT 7.3  BILITOT 0.3  ALKPHOS 90  ALT 11*  AST 27  GLUCOSE 98     Imaging/Diagnostic Tests: No results found.   Beaulah DinningGambino, Tyiana Hill M, MD 11/05/2016, 7:31 AM PGY-2, [place] FPTS Intern pager: [phone], text pages welcome

## 2016-11-06 LAB — VITAMIN B12: Vitamin B-12: 410 pg/mL (ref 180–914)

## 2016-11-06 LAB — FOLATE: Folate: 9.8 ng/mL (ref >5.9)

## 2016-11-06 MED ORDER — AMOXICILLIN-POT CLAVULANATE 875-125 MG PO TABS
1.0000 | ORAL_TABLET | Freq: Two times a day (BID) | ORAL | Status: DC
  Administered 2016-11-06 – 2016-11-07 (×3): 1 via ORAL
  Filled 2016-11-06 (×4): qty 1

## 2016-11-06 MED ORDER — AZITHROMYCIN 500 MG PO TABS
500.0000 mg | ORAL_TABLET | Freq: Every day | ORAL | Status: AC
  Administered 2016-11-06: 500 mg via ORAL
  Filled 2016-11-06: qty 1

## 2016-11-06 MED ORDER — AZITHROMYCIN 500 MG PO TABS
250.0000 mg | ORAL_TABLET | Freq: Every day | ORAL | Status: DC
  Administered 2016-11-07: 250 mg via ORAL
  Filled 2016-11-06: qty 1

## 2016-11-06 NOTE — Progress Notes (Signed)
Family Medicine Teaching Service Daily Progress Note Intern Pager: [phone]  [name] Medical record number: [id number] Date of birth: Aug 30, 1985 Age: 31 y.o. Gender: female  Primary Care Provider: Raliegh IpGottschalk, Ashly M, DO Consultants: None  Code Status: FULL CODE  Pt Overview and Major Events to Date:  5/19 admitted for IV Abx, pneumonia on CXR 5/21 transition to PO meds  Assessment and Plan: Mickel DuhamelRobin C [name] a 31 y.o.femalepresenting with coughing, intermittent fever. PMH is significant for infantile cerebral palsy, seizure disorder, profound intellectual disability.  Sepsis 2/2 Left lower lobe pneumonia: Afebrile and normothermic all day yesterday. Hypotensive but seems like baseline per mom's report that SBP usually in 90s-100s.. Still slightly tachypneic this morning, with normal work of breathing and no O2 requirement. Blood and urine culture negative x48hours -transfer to floor -Vitals per floor protocol - vanc + zosyn (5/19-) switch to PO Augmentin and Azithromycin today  Hypotension: BP 93/54 this morning. Patient unable to endorse symptoms due to intellectual disability. Assuming this is likely from sepsis. Not on any diuretics or antihypertensives. Ate 75% of breakfast this morning. Mom states that systolic is back to baseline, but that diastolic in 40s-50s is low for her. - Can given another 500cc bolus if BP does not improve - Continue to hold home Lasix  Macrocytic anemia History of B12 deficiency in the past, and macrocytic anemia, Hgb 9.5 now.  - Folic acid and B12 started  Infantile cerebral palsy/intellectual disability:completely dependent upon mother for ADLs. Lower extremity edema, non-pitting. Lasix qday or every other day at home. Held here. - OOB with assistance only  LE swelling:on PRN Lasix at home for edema. 1+swelling noted on admission but no crackles on lung exam, doubt fluid overload. Echo yesterday completely normal.   -  holding home PRN Lasix given hypotension.  Constipation:  - continue home lactulose  Seizure disorder: takes Depakote 1000mg  BID. Keppra 1200mg  BID. Topamax 150mg  BID, Carnitor 0.635mh TID, Banzel susp 5mg  BID. Diastat prn rectal. Depakote level normal at 87.  - continue home regimen  GERD: on Protonix 10cc daily  - Continue Protonix   FEN/GI: heart healthy diet/ protonix Prophylaxis: lovenox  Disposition: transfer to floor today  Subjective:  Happy and interactive today. Michele [name] grabbed my hands and clapped them during the interview with mom. Per mom, she is looking back to her baseline   Objective: Temp:  [96.7 F (35.9 C)-98.3 F (36.8 C)] 97.8 F (36.6 C) (05/21 0737) Pulse Rate:  [69-87] 86 (05/21 0355) Resp:  [18-26] 26 (05/21 0355) BP: (53-134)/(37-84) 93/54 (05/21 0737) SpO2:  [94 %-100 %] 98 % (05/21 0355) Physical Exam: General: Well nourished young woman, sitting up in bed smiling and singing Cardiovascular: RRR no murmurs Respiratory: CTAB, no crackles, no rhonchi, no wheezes Abdomen: Soft, nontender, non distended. Normoactive bowel sounds Extremities: Tiny feet, very edematous, but non pitting. Edema on legs mostly non pitting, but 1+ pitting over tibia of right leg to the mid shin.  Laboratory:  Recent Labs Lab 11/03/16 2358 11/05/16 1212  WBC 10.0 9.0  HGB 10.6* 9.5*  HCT 34.2* 32.7*  PLT 228 197    Recent Labs Lab 11/03/16 2358 11/05/16 1212  NA 145 142  K 4.6 4.4  CL 106 113*  CO2 30 24  BUN 29* 18  CREATININE 0.96 0.80  CALCIUM 9.3 8.4*  PROT 7.3  --   BILITOT 0.3  --   ALKPHOS 90  --   ALT 11*  --   AST  27  --   GLUCOSE 98 89     Devasthali, Oneita Kras, Medical Student 11/06/2016, 8:39 AM MS4, [facility] Family Medicine FPTS Intern pager: [phone], text pages welcome  ________________________________________________________________________ I agree with the medical student's note above, with notable exceptions in the assessment  and plan which I have given below.   Michele [name] is a 31 y.o., here with with coughing, intermittent fever. PMH is significant for infantile cerebral palsy, seizure disorder, profound intellectual disability.  Physical Exam General: Well nourished young woman, NAD Cardiovascular: RRR no murmurs Respiratory: CTAB, no crackles, no rhonchi, no wheezes Extremities: Tiny feet, 1+ pitting edema to mid-shin  Plan:  Quinisha C [name] a 31 y.o.femalepresenting with coughing, intermittent fever. PMH is significant for infantile cerebral palsy, seizure disorder, profound intellectual disability.  Sepsis 2/2 Left lower lobe pneumonia: Afebrile and normothermic all day yesterday. Hypotensive but seems like baseline per mom's report that SBP usually in 90s-100s.. Still slightly tachypneic this morning, with normal work of breathing and no O2 requirement. Blood and urine culture negative x48hours -transfer to floor -Vitals per floor protocol - vanc + zosyn (5/19-5/21)  - Start PO Augmentin and Azithromycin today  Hypotension: BP 93/54 this morning. Patient unable to endorse symptoms due to intellectual disability. Assuming this is likely from sepsis. Not on any diuretics or antihypertensives. Ate 75% of breakfast this morning. Mom states that systolic is back to baseline, but that diastolic in 40s-50s is low for her. - Continue to hold home Lasix  Macrocytic anemia History of B12 deficiency in the past, and macrocytic anemia, Hgb 9.5 now.  - Recheck folic acid and B12  - Continue home folic acid   Infantile cerebral palsy/intellectual disability:completely dependent upon mother for ADLs.   LE swelling:on PRN Lasix at home for edema. 1+swelling noted on admission but no crackles on lung exam, doubt fluid overload. Echo normal. - holding home PRN Lasix   Constipation:  - continue home lactulose  Seizure disorder: takes Depakote 1000mg  BID. Keppra 1200mg  BID. Topamax 150mg  BID, Carnitor  0.20mh TID, Banzel susp 5mg  BID. Diastat prn rectal. Depakote level normal at 87.  - continue home regimen  GERD: on Protonix 10cc daily  - Continue Protonix   FEN/GI: heart healthy diet/ protonix Prophylaxis: lovenox  Disposition: transfer to floor today  Noralee Chars, MD Family Medicine, PGY 2 11/06/2016 1:16 PM

## 2016-11-06 NOTE — Progress Notes (Signed)
Patient to transfer to 5W-02. [facility],RN.

## 2016-11-06 NOTE — Progress Notes (Signed)
**  Social Note**  Michele PineRobin C [name] was admitted for sepsis related to LLL pneumonia.  Mother reports she is much improved and is back to her baseline.  She denies concerns and voices great appreciation for the excellent care being provided by the inpatient team.  Patient currently on day 1 of PO abx, day 3 of total abx.  So far, VSS (patient's SBP is near 90-100 at baseline).  She was eating while I was in the room and laughing.  She clinically appears to be at her baseline.    I have scheduled a follow up visit for patient on 6/1 @ 845am.  Mother instructed to call for same day appt if she has concern prior that appt.    Jams Trickett M. Nadine CountsGottschalk, DO PGY-3, [facility]

## 2016-11-06 NOTE — Plan of Care (Signed)
Problem: Safety: Goal: Ability to remain free from injury will improve Outcome: Progressing Mom frequently at bedside; alarms used when she is not. Patient compliant with staying in bed. Frequent checks performed for safety when mom not here.   Problem: Physical Regulation: Goal: Ability to maintain clinical measurements within normal limits will improve Outcome: Progressing BP still soft. Continuing to monitor.   Problem: Skin Integrity: Goal: Risk for impaired skin integrity will decrease Outcome: Completed/Met Date Met: 11/06/16 Patient's mother keeps her clean and dry ( using diaper for incontinence. ) No signs of skin breakdown. Patient frequently repositions self.   Problem: Tissue Perfusion: Goal: Risk factors for ineffective tissue perfusion will decrease Outcome: Completed/Met Date Met: 11/06/16 Lovenox for VTE.  Problem: Fluid Volume: Goal: Ability to maintain a balanced intake and output will improve Outcome: Completed/Met Date Met: 11/06/16 Patient with good output. Do not have I and O's documented due to use of diaper and mother changing patient without informing staff. Per mom- patient at baseline.  Problem: Bowel/Gastric: Goal: Will not experience complications related to bowel motility Outcome: Completed/Met Date Met: 11/06/16 Patient remains regular with BID lactulose.

## 2016-11-06 NOTE — Care Management Note (Signed)
Case Management Note  Patient Details  Name: Michele [name] MRN: [id number] Date of Birth: [birth year]  Subjective/Objective:    From home with mom and sister, has hx of cerebral palsy, seizure d/o and intellectual disability.  Presents with sepsis secondary to pna, hypotension, macrocytic anemia, le swelling, constipation.                 Action/Plan: NCM will follow for dc needs.  Expected Discharge Date:                  Expected Discharge Plan:  Home/Self Care  In-House Referral:     Discharge planning Services  CM Consult  Post Acute Care Choice:    Choice offered to:     DME Arranged:    DME Agency:     HH Arranged:    HH Agency:     Status of Service:  In process, will continue to follow  If discussed at Long Length of Stay Meetings, dates discussed:    Additional Comments:  Leone Havenaylor, Davian Wollenberg Clinton, RN 11/06/2016, 4:12 PM

## 2016-11-07 MED ORDER — AZITHROMYCIN 250 MG PO TABS: 250.0000 mg | ORAL_TABLET | Freq: Every day | ORAL | 0 refills | Status: DC

## 2016-11-07 MED ORDER — FLUCONAZOLE 150 MG PO TABS: ORAL_TABLET | ORAL | 1 refills | Status: DC

## 2016-11-07 MED ORDER — AMOXICILLIN-POT CLAVULANATE 875-125 MG PO TABS: 1.0000 | ORAL_TABLET | Freq: Two times a day (BID) | ORAL | 0 refills | Status: DC

## 2016-11-07 NOTE — Discharge Instructions (Signed)
It has been a pleasure taking care of you! You were admitted due to cough, which was likely due to pneumonia. We have treated you with antibiotics. With that your symptoms improved to the point we think it is safe to let you go home and follow up with your primary care doctor. We are discharging you on more antibiotics that you need to continue taking after you leave the hospital to complete the treatment course. There could be some changes made to your home medications during this hospitalization. Please, make sure to read the directions before you take them. The names and directions on how to take these medications are found on this discharge paper under medication section.  Please follow up at your primary care doctor's office. The address, date and time are found on the discharge paper under follow up section.  Once you are discharged, your primary care physician will handle any further medical issues. Please note that NO REFILLS for any discharge medications will be authorized once you are discharged, as it is imperative that you return to your primary care physician (or establish a relationship with a primary care physician if you do not have one) for your aftercare needs so that they can reassess your need for medications and monitor your lab values.  Take care,

## 2016-11-07 NOTE — Progress Notes (Signed)
Family Medicine Teaching Service Daily Progress Note Intern Pager: [phone]  [name] Medical record number: [id number] Date of birth: [birth year] Age: 31 y.o. Gender: female  Primary Care Provider: Raliegh IpGottschalk, Ashly M, DO Consultants: None  Code Status: FULL CODE  Pt Overview and Major Events to Date:  5/19 admitted for IV Abx, pneumonia on CXR 5/21 transition to PO meds  Assessment and Plan: Michele DuhamelRobin C [name] a 31 y.o.femalepresenting with coughing, intermittent fever. PMH is significant for infantile cerebral palsy, seizure disorder, profound intellectual disability.  Sepsis 2/2 Left lower lobe pneumonia:  continues to be a febrile and normotensive - vanc + zosyn (5/19-)  - switched to PO Augmentin and Azithromycin 11/06/2016. We will discharge on this.   Macrocytic anemia history of B12 deficiency in the past, and macrocytic anemia, Hgb 9.5 now. Baseline about 10.  - Folic acid and B12 started  Infantile cerebral palsy/intellectual disability:completely dependent upon mother for ADLs. Lower extremity edema, non-pitting. Lasix qday or every other day at home. Held here. - OOB with assistance only  LE swelling:Baseline per mother's report.. Echo yesterday completely normal.   - We'll continue home Lasix  Constipation:  - continue home lactulose  Seizure disorder: takes Depakote 1000mg  BID. Keppra 1200mg  BID. Topamax 150mg  BID, Carnitor 0.745mh TID, Banzel susp 5mg  BID. Diastat prn rectal. Depakote level normal at 87.  - continue home regimen  GERD: on Protonix 10cc daily  - Continue Protonix   FEN/GI: heart healthy diet/ protonix  Prophylaxis: lovenox  Disposition: Discharge home today  Subjective:  Happy and interactive today. Michele BallRobin grabbed my hands and clapped them during the interview with mom. Per mom, she is looking back to her baseline and ready for discharge  Objective: Temp:  [97.5 F (36.4 C)-97.6 F (36.4 C)] 97.6 F (36.4 C)  (05/22 0546) Pulse Rate:  [75-87] 87 (05/22 0546) Resp:  [16-17] 16 (05/22 0546) BP: (104-106)/(55-72) 106/72 (05/22 0546) SpO2:  [97 %-100 %] 97 % (05/22 0546) Physical Exam: General: Well nourished young woman, lying in bed smiling and singing Cardiovascular: RRR no murmurs Respiratory: CTAB, no crackles, no rhonchi, no wheezes Abdomen: Soft, nontender, non distended. Normoactive bowel sounds Extremities: Tiny feet, very edematous, but non pitting. Edema on legs mostly non pitting, but 1+ pitting over tibia of right leg to the mid shin.  Laboratory:  Recent Labs Lab 11/03/16 2358 11/05/16 1212  WBC 10.0 9.0  HGB 10.6* 9.5*  HCT 34.2* 32.7*  PLT 228 197    Recent Labs Lab 11/03/16 2358 11/05/16 1212  NA 145 142  K 4.6 4.4  CL 106 113*  CO2 30 24  BUN 29* 18  CREATININE 0.96 0.80  CALCIUM 9.3 8.4*  PROT 7.3  --   BILITOT 0.3  --   ALKPHOS 90  --   ALT 11*  --   AST 27  --   GLUCOSE 98 89     Almon HerculesGonfa, Taye T, MD 11/07/2016, 5:25 PM PGY-2, [facility] FPTS Intern pager: [phone], text pages welcome

## 2016-11-07 NOTE — Progress Notes (Signed)
Michele [name] to be D/C'd to home per MD order.  Discussed with the patient's mother/caregiver and all questions fully answered.  VSS, Skin clean, dry and intact without evidence of skin break down, no evidence of skin tears noted. IV catheter discontinued intact. Site without signs and symptoms of complications. Dressing and pressure applied.  An After Visit Summary was printed and given to the patient's mother. Patient's mother received prescriptions.  D/c education completed with family including follow up instructions, medication list, d/c activities limitations if indicated, with other d/c instructions as indicated by MD - patient able to verbalize understanding, all questions fully answered.   Instructed to return to ED, call 911, or call MD for any changes in condition.   Patient escorted via WC, and D/C home via private auto.  [name] 11/07/2016 11:25 AM

## 2016-11-07 NOTE — Care Management Note (Signed)
Case Management Note  Patient Details  Name: Michele [name] MRN: [id number] Date of Birth: January 18, 1986  Subjective/Objective:   PNA                 Action/Plan: Discharge Planning: NCM spoke to mother, Michele [name] at bedside. Mother reports pt has aide that comes in daily, did not give information on number of hours. She has [employer], wheelchair and hospital bed. Able to get meds without any complications.   PCP Raliegh IpGOTTSCHALK, ASHLY M MD   Expected Discharge Date:  11/07/16               Expected Discharge Plan:  Home/Self Care  In-House Referral:  NA  Discharge planning Services  CM Consult  Post Acute Care Choice:  NA Choice offered to:  NA  DME Arranged:  N/A DME Agency:  NA  HH Arranged:  NA HH Agency:  NA  Status of Service:  Completed, signed off  If discussed at Long Length of Stay Meetings, dates discussed:    Additional Comments:  Elliot CousinShavis, Omarii Scalzo Ellen, RN 11/07/2016, 10:43 AM

## 2016-11-08 NOTE — Discharge Summary (Signed)
Family Medicine Teaching [facility] Discharge Summary  Patient name: Michele [name] Medical record number: [id number] Date of birth: 10-20-1985 Age: 31 y.o. Gender: female Date of Admission: 11/03/2016  Date of Discharge: 11/07/2016 Admitting Physician: Latrelle Dodrill, MD  Primary Care Provider: Raliegh Ip, DO Consultants: none  Indication for Hospitalization: Cough on vital instability  Discharge Diagnoses/Problem List:  Sepsis/pneumonia Infantile cerebral palsy Seizure disorder Profound intellectual disability Constipation GERD  Disposition: Home with mother  Discharge Condition: Stable  Discharge Exam:  General: Well nourished young woman, lying in bed smiling and interactive Cardiovascular: RRR no murmurs Respiratory: CTAB, no crackles, no rhonchi, no wheezes Abdomen: Soft, nontender, non distended. Normoactive bowel sounds Extremities: Tiny feet, very edematous, but non pitting. Edema on legs mostly non pitting, but 1+ pitting over tibia of right leg to the mid shin.  Brief Hospital Course:  Michele [name] is 31 yo female with history of infantile cerebral palsy, seizure disorder, profound intellectual disability who was admitted with 1-2 weeks of cough and vital instability concerning for pneumonia. On admission, there was a  concern about sepsis with pulmonary source due to hypotension to 70/40 but without leukocytosis or fever. CXR with patchy opacity within the retrocardiac left lower lobe concerning for atelectasis/pneumonia. Blood culture was drawn and she was started on Vanco and Zosyn (5/19-5/21).  Blood pressure rapidly responded to initial fluid resuscitation with 3 L of normal saline bolus. Patient remained afebrile and hemodynamically stable. Blood culture was negative. She transitioned to oral Augmentin and azithromycin and observed for 24 hour on oral antibiotic. She continued to do well and remained febrile.  On the day of discharge, patient was back  to her baseline per her mother. Clinically, she looked well as well. She was discharged on Augmentin to complete a total of 7 days and azithromycin for 1 more day.  She was also given a prescription for Diflucan in case she develops yeast infection due to antibiotic.   Other chronic medical conditions were stable.   Issues for Follow Up:  1. Spesis/Pneumonia: Discharge on Augmentin to complete a 7 days course and azithromycin for 1 more day  Significant Procedures: none  Significant Labs and Imaging:   Recent Labs Lab 11/03/16 2358 11/05/16 1212  WBC 10.0 9.0  HGB 10.6* 9.5*  HCT 34.2* 32.7*  PLT 228 197    Recent Labs Lab 11/03/16 2358 11/05/16 1212  NA 145 142  K 4.6 4.4  CL 106 113*  CO2 30 24  GLUCOSE 98 89  BUN 29* 18  CREATININE 0.96 0.80  CALCIUM 9.3 8.4*  ALKPHOS 90  --   AST 27  --   ALT 11*  --   ALBUMIN 3.1*  --     Results/Tests Pending at Time of Discharge: none  Discharge Medications:  Allergies as of 11/07/2016      Reactions   Carbamazepine Rash      Medication List    TAKE these medications   amoxicillin-clavulanate 875-125 MG tablet Commonly known as:  AUGMENTIN Take 1 tablet by mouth every 12 (twelve) hours. Start the evening of 11/07/2016.   azithromycin 250 MG tablet Commonly known as:  ZITHROMAX Take 1 tablet (250 mg total) by mouth daily. On 11/08/2016   BANZEL 40 MG/ML Susp Generic drug:  Rufinamide TAKE 5 MLS BY MOUTH IN THE MORNING AND 5 MLS BY MOUTH AT NIGHT   CALCARB 600/D 600-400 MG-UNIT tablet Generic drug:  Calcium Carbonate-Vitamin D Take 1 tablet by mouth  2 (two) times daily. 1 tab per tube 2 time a day   CARNITOR 1 GM/10ML solution Generic drug:  levOCARNitine TAKE 5 MLS BY MOUTH THREE TIMES DAILY   clotrimazole 1 % cream Commonly known as:  LOTRIMIN APPLY EXTERNALLY TO THE AFFECTED AREA TWICE DAILY   DEPAKOTE SPRINKLES 125 MG capsule Generic drug:  divalproex TAKE 8 CAPSULES BY MOUTH TWICE DAILY What  changed:  how much to take  how to take this  when to take this  additional instructions   DIASTAT ACUDIAL 20 MG Gel Generic drug:  diazepam DIAL TO 15 MG, GIVE RECTALLY FOR SEIZURES LASTING 2 MINUTES OR LONGER   feeding supplement (ENSURE ENLIVE) Liqd Take 237 mLs by mouth 3 (three) times daily between meals.   fluconazole 150 MG tablet Commonly known as:  DIFLUCAN Take 1 tablet (150 mg total) by mouth once for yeast infection. May repeat in 72 hours if no improvement.   fluticasone 50 MCG/ACT nasal spray Commonly known as:  FLONASE SHAKE LIQUID AND USE 2 SPRAYS IN EACH NOSTRIL DAILY   folic acid 1 MG tablet Commonly known as:  FOLVITE Take 1 mg by mouth every morning. 1 tab per tube daily   furosemide 20 MG tablet Commonly known as:  LASIX TAKE 1 TABLET(20 MG) BY MOUTH DAILY AS NEEDED FOR FLUID RETENTION   hydrocortisone cream 0.5 % Apply 1 application topically 2 (two) times daily.   KEPPRA 100 MG/ML solution Generic drug:  levETIRAcetam TAKE 12 MLS BY MOUTH EVERY MORNING AND 12 MLS EVERY EVENING   lactulose 10 GM/15ML solution Commonly known as:  CHRONULAC TAKE 30 ML BY MOUTH TWICE DAILY   nystatin powder Generic drug:  nystatin APPLY TO AFFECTED DIAPER AREAS WITH EVERY DIAPER CHANGE UNTIL RASH CLEARS   ondansetron 4 MG/5ML solution Commonly known as:  ZOFRAN TAKE 5 MLS FOR PAIN EVERY 8 HOURS AS NEEDED FOR NAUSEA OR VOMITING   PROTONIX 40 mg/20 mL Pack Generic drug:  pantoprazole sodium PLACE 10 ML INTO THE FEEDING TUBE DAILY   TOPAMAX SPRINKLE 25 MG capsule Generic drug:  topiramate TAKE 6 CAPSULES BY MOUTH EVERY MORNING AND 6 CAPSULES BY MOUTH EVERY EVENING WITH FOOD What changed:  See the new instructions.       Discharge Instructions: Please refer to Patient Instructions section of EMR for full details.  Patient was counseled important signs and symptoms that should prompt return to medical care, changes in medications, dietary instructions,  activity restrictions, and follow up appointments.   Follow-Up Appointments: Follow-up Information    Delynn FlavinGottschalk, Ashly M, DO. Go on 11/17/2016.   Specialty:  Family Medicine Why:  8:45am (hospital follow up) Contact information: [address] [place] [address] [phone]           Almon HerculesGonfa, Taye T, MD 11/08/2016, 9:30 AM PGY-2, [facility]

## 2016-11-09 DIAGNOSIS — I959 Hypotension, unspecified: Secondary | ICD-10-CM

## 2016-11-09 LAB — CULTURE, BLOOD (ROUTINE X 2)
CULTURE: NO GROWTH
SPECIAL REQUESTS: ADEQUATE

## 2016-11-17 ENCOUNTER — Ambulatory Visit (INDEPENDENT_AMBULATORY_CARE_PROVIDER_SITE_OTHER): Payer: Medicaid Other | Admitting: Family Medicine

## 2016-11-17 ENCOUNTER — Encounter: Payer: Self-pay | Admitting: Family Medicine

## 2016-11-17 VITALS — BP 106/68 | Temp 97.4°F | Ht 59.0 in

## 2016-11-17 DIAGNOSIS — Z09 Encounter for follow-up examination after completed treatment for conditions other than malignant neoplasm: Secondary | ICD-10-CM | POA: Diagnosis present

## 2016-11-17 NOTE — Progress Notes (Signed)
    Subjective: CC: Hospital follow up HPI: Michele [name] is a 31 y.o. female presenting to clinic today for:  [facility] for sepsis related to LLL CAP.  She was treated with IV abx and discharged on Augmentin and Azithromycin.  Mother reports that since discharge she has been doing well.  She notes that she had a yeast infection that did not clear up completely from Diflucan PO x1.  She used OTC Monistat, which cleared it up.  She notes she had excoriations on the inner thigh that resolved with barrier cream.  She has been eating, drinking, voiding normally.  No SOB, cough, fevers, chills, vomiting.  She had diarrhea with Augmentin, that resolved after completion of abx.  Social Hx reviewed: resides with mother. MedHx, medications and allergies reviewed.  Please see EMR. ROS: Per HPI  Objective: Office vital signs reviewed. BP 106/68   Temp 97.4 F (36.3 C) (Axillary)   Ht 4\' 11"  (1.499 m)   Physical Examination:  General: Awake, alert, well nourished, happy, cerebral palsy female HEENT: MMM Cardio: regular rate and rhythm, S1S2 heard, no murmurs appreciated Pulm: clear to auscultation bilaterally, no wheezes, rhonchi or rales; normal work of breathing on room air MSK: wheelchair bound, RLE in boot  Assessment/ Plan: 31 y.o. female   1. Hospital discharge follow-up.  Patient doing very well since discharge.  Clear lung exam today.  She clinically continues to be at baseline.  Very animated during her exam. - Follow up prn - May resume normal activities - Note for day program provided   Raliegh IpAshly M Dorathea Faerber, DO PGY-3, [facility]

## 2016-11-17 NOTE — Patient Instructions (Signed)
It was so great seeing you guys.  I'm glad to see that she is feeling better.  Plan to come back in the next few months for check in with the new primary care doctor.

## 2016-11-27 ENCOUNTER — Other Ambulatory Visit: Payer: Self-pay | Admitting: Family Medicine

## 2016-11-27 DIAGNOSIS — K219 Gastro-esophageal reflux disease without esophagitis: Secondary | ICD-10-CM

## 2016-12-01 ENCOUNTER — Encounter (INDEPENDENT_AMBULATORY_CARE_PROVIDER_SITE_OTHER): Payer: Self-pay | Admitting: Orthopaedic Surgery

## 2016-12-01 ENCOUNTER — Ambulatory Visit (INDEPENDENT_AMBULATORY_CARE_PROVIDER_SITE_OTHER): Payer: Medicaid Other | Admitting: Orthopaedic Surgery

## 2016-12-01 ENCOUNTER — Ambulatory Visit (INDEPENDENT_AMBULATORY_CARE_PROVIDER_SITE_OTHER): Payer: Medicaid Other

## 2016-12-01 DIAGNOSIS — M79604 Pain in right leg: Secondary | ICD-10-CM | POA: Diagnosis not present

## 2016-12-01 NOTE — Progress Notes (Signed)
Office Visit Note   Patient: Michele [name]           Date of Birth: 1986/06/05           MRN: [id number] Visit Date: 12/01/2016              Requested by: Raliegh Ip, DO [address] [place], [place] [address] PCP: Raliegh Ip, DO   Assessment & Plan: Visit Diagnoses:  1. Right leg pain     Plan: X-rays show that her fracture is healed. Can return back to her daycare program. Okay to weight-bear as tolerated. Follow-up the office when necessary. Caregiver will contact us if there are any questions or concerns.  Follow-Up Instructions: Return if symptoms worsen or fail to improve.   Orders:  Orders Placed This Encounter  Procedures  . XR Tibia/Fibula Right   No orders of the defined types were placed in this encounter.     Procedures: No procedures performed   Clinical Data: No additional findings.   Subjective: Chief Complaint  Patient presents with  . Right Leg - Follow-up    HPI Patient returns with caregiver today for healing right tibia fracture. Caregiver states that she is doing well. Review of Systems  Unable to perform ROS: Patient nonverbal     Objective: Vital Signs: There were no vitals taken for this visit.  Physical Exam  Constitutional: No distress.  HENT:  Head: Normocephalic and atraumatic.  Pulmonary/Chest: No respiratory distress.    Ortho Exam  Specialty Comments:  No specialty comments available.  Imaging: Xr Tibia/fibula Right  Result Date: 12/01/2016 X-ray show that right tibia fracture is healed. Good callus formation.    PMFS History: Patient Active Problem List   Diagnosis Date Noted  . Arterial hypotension   . Sepsis (HCC) 11/04/2016  . Other constipation   . Unsteadiness on feet   . Community acquired pneumonia of left lower lobe of lung (HCC) 04/05/2016  . Cough 04/03/2016  . Rhinitis, non-allergic 06/23/2015  . Special screening examination for human papillomavirus (HPV) 07/20/2014    . Amenorrhea 05/19/2014  . 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 Long-term use of high-risk medication 12/24/2012  . Morbid obesity (HCC) 12/24/2012  . 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 URINARY INCONTINENCE 02/25/2010  . 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 Hypothyroidism 01/29/2009  . 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 Infantile cerebral palsy (HCC) 01/29/2009  . GASTROESOPHAGEAL REFLUX DISEASE 01/29/2009  . Osteoporosis 01/29/2009  . Seizure disorder (HCC) 01/29/2009   Past Medical History:  Diagnosis Date  . Cerebral palsy (HCC)   . Hypothyroidism   . Lennox-Gastaut syndrome (HCC)   . Osteoporosis   . Pneumonia   . Profound mental retardation   . Reflux   . Rickets   . Seizures (HCC)     Family History  Problem Relation Age of Onset  . Hypertension Mother   . Heart disease Maternal Grandmother        Died at 103  . Stroke Maternal Grandfather        died at 50  . Hypertension Paternal Grandmother   . Diabetes Unknown        MGGM  . Heart disease Unknown        MGGF  . Kidney disease Unknown        Maternal Great Aunt   . Cancer Maternal Aunt  lung  . Colon cancer Neg Hx     Past Surgical History:  Procedure Laterality Date  . FOOT MASS EXCISION Right   . Gastrocutaneous fistual repair  2012  . PEG TUBE PLACEMENT     Social History   Occupational History  . Disabled     Social History Main Topics  . Smoking status: Never Smoker  . Smokeless tobacco: Never Used  . Alcohol use No  . Drug use: No  . Sexual activity: No

## 2016-12-05 ENCOUNTER — Emergency Department (HOSPITAL_COMMUNITY): Payer: Medicaid Other

## 2016-12-05 ENCOUNTER — Encounter (HOSPITAL_COMMUNITY): Payer: Self-pay | Admitting: Emergency Medicine

## 2016-12-05 ENCOUNTER — Inpatient Hospital Stay (HOSPITAL_COMMUNITY)
Admission: EM | Admit: 2016-12-05 | Discharge: 2016-12-06 | DRG: 194 | Discharge status: Against Medical Advice | Payer: Medicaid Other | Attending: Family Medicine | Admitting: Family Medicine

## 2016-12-05 DIAGNOSIS — E039 Hypothyroidism, unspecified: Secondary | ICD-10-CM | POA: Diagnosis present

## 2016-12-05 DIAGNOSIS — G40812 Lennox-Gastaut syndrome, not intractable, without status epilepticus: Secondary | ICD-10-CM | POA: Diagnosis present

## 2016-12-05 DIAGNOSIS — Z8701 Personal history of pneumonia (recurrent): Secondary | ICD-10-CM

## 2016-12-05 DIAGNOSIS — Z888 Allergy status to other drugs, medicaments and biological substances status: Secondary | ICD-10-CM

## 2016-12-05 DIAGNOSIS — Z7951 Long term (current) use of inhaled steroids: Secondary | ICD-10-CM

## 2016-12-05 DIAGNOSIS — K219 Gastro-esophageal reflux disease without esophagitis: Secondary | ICD-10-CM | POA: Diagnosis present

## 2016-12-05 DIAGNOSIS — Y95 Nosocomial condition: Secondary | ICD-10-CM | POA: Diagnosis present

## 2016-12-05 DIAGNOSIS — M810 Age-related osteoporosis without current pathological fracture: Secondary | ICD-10-CM | POA: Diagnosis present

## 2016-12-05 DIAGNOSIS — G808 Other cerebral palsy: Secondary | ICD-10-CM | POA: Diagnosis present

## 2016-12-05 DIAGNOSIS — Z79899 Other long term (current) drug therapy: Secondary | ICD-10-CM

## 2016-12-05 DIAGNOSIS — F73 Profound intellectual disabilities: Secondary | ICD-10-CM | POA: Diagnosis present

## 2016-12-05 DIAGNOSIS — J189 Pneumonia, unspecified organism: Secondary | ICD-10-CM

## 2016-12-05 LAB — CBC
HCT: 32.2 % — ABNORMAL LOW (ref 36.0–46.0)
Hemoglobin: 9.7 g/dL — ABNORMAL LOW (ref 12.0–15.0)
MCH: 30.7 pg (ref 26.0–34.0)
MCHC: 30.1 g/dL (ref 30.0–36.0)
MCV: 101.9 fL — ABNORMAL HIGH (ref 78.0–100.0)
Platelets: 212 10*3/uL (ref 150–400)
RBC: 3.16 MIL/uL — ABNORMAL LOW (ref 3.87–5.11)
RDW: 16.5 % — AB (ref 11.5–15.5)
WBC: 12.7 10*3/uL — AB (ref 4.0–10.5)

## 2016-12-05 MED ORDER — SODIUM CHLORIDE 0.9 % IV BOLUS (SEPSIS)
1000.0000 mL | Freq: Once | INTRAVENOUS | Status: AC
  Administered 2016-12-06: 1000 mL via INTRAVENOUS

## 2016-12-05 NOTE — ED Notes (Addendum)
Pt from home with her mother with complaints of fever and fatigue x 2 days. Pt's mother states she has been giving pt tylenol since Sunday. Pt's max fever at home 102, last tylenol was given around 1900 this evening. Per mother, pt has been acting very fatigued. Pt has had 2 episodes of diarrhea, but no emesis today. Pt has also had a productive cough with green sputum.

## 2016-12-05 NOTE — ED Notes (Signed)
Unable to collect labs patient family is in the room changing her.

## 2016-12-06 ENCOUNTER — Ambulatory Visit (INDEPENDENT_AMBULATORY_CARE_PROVIDER_SITE_OTHER): Payer: Medicaid Other | Admitting: Family Medicine

## 2016-12-06 ENCOUNTER — Telehealth: Payer: Self-pay | Admitting: Internal Medicine

## 2016-12-06 ENCOUNTER — Encounter: Payer: Self-pay | Admitting: Family Medicine

## 2016-12-06 VITALS — BP 98/60 | HR 75 | Temp 97.2°F

## 2016-12-06 DIAGNOSIS — G808 Other cerebral palsy: Secondary | ICD-10-CM | POA: Diagnosis present

## 2016-12-06 DIAGNOSIS — E039 Hypothyroidism, unspecified: Secondary | ICD-10-CM | POA: Diagnosis present

## 2016-12-06 DIAGNOSIS — Z8701 Personal history of pneumonia (recurrent): Secondary | ICD-10-CM | POA: Diagnosis not present

## 2016-12-06 DIAGNOSIS — Z7951 Long term (current) use of inhaled steroids: Secondary | ICD-10-CM | POA: Diagnosis not present

## 2016-12-06 DIAGNOSIS — Z79899 Other long term (current) drug therapy: Secondary | ICD-10-CM | POA: Diagnosis not present

## 2016-12-06 DIAGNOSIS — R509 Fever, unspecified: Secondary | ICD-10-CM | POA: Diagnosis present

## 2016-12-06 DIAGNOSIS — Y95 Nosocomial condition: Secondary | ICD-10-CM | POA: Diagnosis present

## 2016-12-06 DIAGNOSIS — G40812 Lennox-Gastaut syndrome, not intractable, without status epilepticus: Secondary | ICD-10-CM | POA: Diagnosis present

## 2016-12-06 DIAGNOSIS — M810 Age-related osteoporosis without current pathological fracture: Secondary | ICD-10-CM | POA: Diagnosis present

## 2016-12-06 DIAGNOSIS — J189 Pneumonia, unspecified organism: Secondary | ICD-10-CM | POA: Diagnosis present

## 2016-12-06 DIAGNOSIS — F73 Profound intellectual disabilities: Secondary | ICD-10-CM | POA: Diagnosis present

## 2016-12-06 DIAGNOSIS — Z888 Allergy status to other drugs, medicaments and biological substances status: Secondary | ICD-10-CM | POA: Diagnosis not present

## 2016-12-06 DIAGNOSIS — K219 Gastro-esophageal reflux disease without esophagitis: Secondary | ICD-10-CM | POA: Diagnosis present

## 2016-12-06 LAB — COMPREHENSIVE METABOLIC PANEL
ALBUMIN: 2.5 g/dL — AB (ref 3.5–5.0)
ALK PHOS: 66 U/L (ref 38–126)
ALT: 13 U/L — ABNORMAL LOW (ref 14–54)
ANION GAP: 7 (ref 5–15)
AST: 22 U/L (ref 15–41)
BUN: 16 mg/dL (ref 6–20)
CALCIUM: 9.1 mg/dL (ref 8.9–10.3)
CO2: 24 mmol/L (ref 22–32)
Chloride: 109 mmol/L (ref 101–111)
Creatinine, Ser: 0.96 mg/dL (ref 0.44–1.00)
GFR calc Af Amer: >60 mL/min (ref >60)
GFR calc non Af Amer: >60 mL/min (ref >60)
GLUCOSE: 90 mg/dL (ref 65–99)
POTASSIUM: 4 mmol/L (ref 3.5–5.1)
SODIUM: 140 mmol/L (ref 135–145)
Total Bilirubin: <0.1 mg/dL — ABNORMAL LOW (ref 0.3–1.2)
Total Protein: 7.1 g/dL (ref 6.5–8.1)

## 2016-12-06 LAB — LIPASE, BLOOD: Lipase: 38 U/L (ref 11–51)

## 2016-12-06 LAB — I-STAT CG4 LACTIC ACID, ED: Lactic Acid, Venous: 0.54 mmol/L (ref 0.5–1.9)

## 2016-12-06 MED ORDER — LEVOFLOXACIN 750 MG PO TABS: 750.0000 mg | ORAL_TABLET | Freq: Every day | ORAL | 0 refills | Status: DC

## 2016-12-06 MED ORDER — CEFEPIME HCL 2 G IJ SOLR
2.0000 g | Freq: Once | INTRAMUSCULAR | Status: AC
  Administered 2016-12-06: 2 g via INTRAVENOUS
  Filled 2016-12-06: qty 2

## 2016-12-06 MED ORDER — VANCOMYCIN HCL IN DEXTROSE 1-5 GM/200ML-% IV SOLN
1000.0000 mg | Freq: Once | INTRAVENOUS | Status: AC
  Administered 2016-12-06: 1000 mg via INTRAVENOUS
  Filled 2016-12-06: qty 200

## 2016-12-06 NOTE — Patient Instructions (Signed)
Follow up tomorrow with Dr Leonides Schanzorsey.  If Michele [name] develops fevers again, shortness of breath, she is more lethargic or unable to keep fluids down, go to the emergency department.

## 2016-12-06 NOTE — Telephone Encounter (Signed)
After Hours Emergency Line Call:  Mother of patient Michele [name] calling over confusion about why [facility] is [facility]. Dr. Artist PaisYoo accepted admission for CAP. Discussed with mother, [name], that from review of her chart that it appeared the ED was concerned about her BP being on the low side as well as low O2 saturations. Michele [name] reporting that she feels this is how BP normally runs and that O2 now stable on RA and that "sometimes [name]'s oxygen dips and Dr. Nadine CountsGottschalk was working to get home oxygen tank."   Discussed with mother that it was difficult for me to evaluate Michele [name] as she is in another facility and I am solely relying on computer data at present. Michele [name] said that she didn't feel Michele [name] needed to be admitted but that she would have her [facility] so that "we could evaluate her and decide for ourselves". I told [name] that we were happy to see [name] but that once she arrived here she would be an admission status and we would not be able to send her home immediately. At that point, [facility] me to speak to [facility] about [facility] and to schedule Michele [name] a follow [facility].   [facility] and spoke to her about patient. Ward understandably concerned about O2 sat of 88 while she was evaluating patient. O2 saturation now in low 90s (92-93%). Ward had conversation with mother expressing concern that Michele [name] may become more significantly hypoxic again. Ward and I also discussed that Michele [name]'s BP is on the softer side; however, from our review of the chart Michele [name] typically runs in the 100s/60s so this is difficult to tell if lower BP is consistent with her baseline or related to infectious process. Other pertinent data points include mild leukocytosis to 12.7 and temperature of 99.9 at presentation. Ward also reports that patient appears clinically dehydrated and they are having difficulty drawing labs  secondary to this.    Both ER provider and FPTS team feel that immediate discharge from ED is not in patient's best interest at this time. As mother wishes to avoid hospitalization if possible, have created plan that Michele [name] will receive IV antibiotics and continued [facility]Fs [facility] with further monitoring for a few hours. If her clinical status does not change during monitoring process, ED provider will plan to discharge with oral antibiotics. Mother agreeable to remain in ER for treatment and monitoring. Offered [facility]. However, she preferred patient to be seen by PCP Nadine CountsGottschalk so appointment scheduled for today, 6/20, at 2:30 pm.   If [name] changes her mind or Michele [name]'s clinical status were to worsen, FPTS is agreeable to transfer and admission. Appreciate excellent care of [facility] ER providers.  Marcy Sirenatherine Jashawn Floyd, D.O. 12/06/2016, 3:31 AM PGY-2, [facility]

## 2016-12-06 NOTE — Discharge Instructions (Signed)
Please return to the Emergency Department immediately if new or worsening symptoms develop.   Take antibiotics as directed.   See your doctor in the clinic first thing in the morning.

## 2016-12-06 NOTE — ED Provider Notes (Signed)
[facility]-EMER[facility]ENCY DEPT Provider Note   CSN: [id number] Arrival date & time: 12/05/16  2110     History   Chief Complaint Chief Complaint  Patient presents with  . Fever  . Fatigue  . Diarrhea    HPI Ernest PineRobin C Gin is a 31 y.o. female.  The history is provided by a parent and medical records. No language interpreter was used.  Fever   Associated symptoms include diarrhea, congestion and cough.  Diarrhea   Associated symptoms include cough.    Ernest PineRobin C Deblanc is a 31 y.o. female  with a PMH of CP, recent admission for CAP who presents to the Emergency Department complaining of fever Tmax 101, productive cough and congestion x 2 days. Mother has been giving Tylenol as needed with improvement of fevers. Today, patient started having decreased PO intake and mother was having to use a syringe to keep her hydrated. No other meds prior to arrival. Mother notes that when patient left the hospital after admission for PNA she was back to her usual self and felt better. Today's symptoms resemble symptoms she experienced with prior PNA.   Past Medical History:  Diagnosis Date  . Cerebral palsy (HCC)   . Hypothyroidism   . Lennox-Gastaut syndrome (HCC)   . Osteoporosis   . Pneumonia   . Profound mental retardation   . Reflux   . Rickets   . Seizures [facility](HCC)     Patient Active Problem List   Diagnosis Date Noted  . HCAP (healthcare-associated pneumonia) 12/06/2016  . Arterial hypotension   . Sepsis (HCC) 11/04/2016  . Other constipation   . Unsteadiness on feet   . Community acquired pneumonia of left lower lobe of lung (HCC) 04/05/2016  . Cough 04/03/2016  . Rhinitis, non-allergic 06/23/2015  . Special screening examination for human papillomavirus (HPV) 07/20/2014  . Amenorrhea 05/19/2014  . 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 Long-term use of high-risk medication 12/24/2012  . Morbid obesity (HCC) 12/24/2012  . Congenital quadriplegia (HCC)  12/24/2012  . URINARY INCONTINENCE 02/25/2010  . 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 Hypothyroidism 01/29/2009  . 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 Infantile cerebral palsy (HCC) 01/29/2009  . GASTROESOPHAGEAL REFLUX DISEASE 01/29/2009  . Osteoporosis 01/29/2009  . Seizure disorder (HCC) 01/29/2009    Past Surgical History:  Procedure Laterality Date  . FOOT MASS EXCISION Right   . Gastrocutaneous fistual repair  2012  . PEG TUBE PLACEMENT      OB History    No data available       Home Medications    Prior to Admission medications   Medication Sig Start Date End Date Taking? Authorizing Provider  acetaminophen (TYLENOL) 160 MG/5ML suspension Take 320 mg by mouth every 6 (six) hours as needed for mild pain.    Yes [provider]  BANZEL 40 MG/ML SUSP TAKE 5 MLS BY MOUTH IN THE MORNING AND 5 MLS BY MOUTH AT NIGHT 06/26/16  Yes Goodpasture, Inetta Fermoina, NP  Calcium Carbonate-Vitamin D (CALCARB 600/D) 600-400 MG-UNIT per tablet Take 1 tablet by mouth 2 (two) times daily. 1 tab per tube 2 time a day   Yes [provider]  CARNITOR 1 GM/10ML solution TAKE 5 MLS BY MOUTH THREE TIMES DAILY 06/26/16  Yes Goodpasture, Inetta Fermoina, NP  clotrimazole (LOTRIMIN) 1 % cream APPLY EXTERNALLY TO THE AFFECTED AREA TWICE DAILY Patient taking differently: APPLY EXTERNALLY TO THE AFFECTED AREA TWICE DAILY as  needed 01/12/16  Yes Gottschalk, Ashly M, DO  DEPAKOTE SPRINKLES 125 MG capsule TAKE 8 CAPSULES BY MOUTH TWICE DAILY Patient taking differently: Take 1,000 mg by mouth 2 (two) times daily. TAKE 8 CAPSULES BY MOUTH TWICE DAILY 06/26/16  Yes Goodpasture, [name], NP  DIASTAT ACUDIAL 20 MG GEL DIAL TO 15 MG, GIVE RECTALLY FOR SEIZURES LASTING 2 MINUTES OR LONGER 06/26/16  Yes Elveria Rising, NP  feeding supplement, ENSURE ENLIVE, (ENSURE ENLIVE) LIQD Take 237 mLs by mouth 3 (three) times daily between meals. 04/11/16  Yes Beaulah Dinning, MD  fluticasone (FLONASE) 50 MCG/ACT nasal spray SHAKE LIQUID AND USE 2 SPRAYS IN EACH NOSTRIL DAILY 01/12/16  Yes Gottschalk, Ashly M, DO  folic acid (FOLVITE) 1 MG tablet Take 1 mg by mouth every morning. 1 tab per tube daily   Yes [provider]  furosemide (LASIX) 20 MG tablet TAKE 1 TABLET(20 MG) BY MOUTH DAILY AS NEEDED FOR FLUID RETENTION 09/27/16  Yes Gottschalk, Ashly M, DO  guaiFENesin (MUCINEX) 600 MG 12 hr tablet Take 600 mg by mouth 2 (two) times daily.   Yes [provider]  guaiFENesin (ROBITUSSIN) 100 MG/5ML SOLN Take 30 mLs by mouth every 4 (four) hours as needed for cough or to loosen phlegm.   Yes [provider]  hydrocortisone cream 0.5 % Apply 1 application topically 2 (two) times daily. Patient taking differently: Apply 1 application topically 2 (two) times daily as needed for itching.  10/06/15  Yes Gottschalk, Ashly M, DO  KEPPRA 100 MG/ML solution TAKE 12 MLS BY MOUTH EVERY MORNING AND 12 MLS EVERY EVENING 06/26/16  Yes Elveria Rising, NP  lactulose (CHRONULAC) 10 GM/15ML solution TAKE 30 ML BY MOUTH TWICE DAILY 11/27/16  Yes Gottschalk, Ashly M, DO  NYSTATIN powder APPLY TO AFFECTED DIAPER AREAS WITH EVERY DIAPER CHANGE UNTIL RASH CLEARS 10/27/16  Yes Gottschalk, Ashly M, DO  ondansetron (ZOFRAN) 4 MG/5ML solution TAKE 5 MLS FOR PAIN EVERY 8 HOURS AS NEEDED FOR NAUSEA OR VOMITING 06/29/14  Yes Hess, Bryan R, DO  PROTONIX 40 MG PACK PLACE 10 ML INTO THE FEEDING TUBE DAILY 11/27/16  Yes Gottschalk, Ashly M, DO  TOPAMAX SPRINKLE 25 MG capsule TAKE 6 CAPSULES BY MOUTH EVERY MORNING AND 6 CAPSULES BY MOUTH EVERY EVENING WITH FOOD Patient taking differently: TAKE 150 MG BY MOUTH EVERY MORNING AND EVENING WITH FOOD 07/31/16  Yes Gottschalk, Ashly M, DO  levofloxacin (LEVAQUIN) 750 MG tablet Take 1 tablet (750 mg total) by mouth daily. 12/06/16   Ram Haugan, Chase Picket, PA-C    Family History Family History  Problem Relation Age of Onset  .  Hypertension Mother   . Heart disease Maternal Grandmother        Died at 85  . Stroke Maternal Grandfather        died at 84  . Hypertension Paternal Grandmother   . Diabetes Unknown        MGGM  . Heart disease Unknown        MGGF  . Kidney disease Unknown        Maternal Great Aunt   . Cancer Maternal Aunt        lung  . Colon cancer Neg Hx     Social History Social History  Substance Use Topics  . Smoking status: Never Smoker  . Smokeless tobacco: Never Used  . Alcohol use No     Allergies   Carbamazepine   Review of Systems Review of Systems  Constitutional:  Positive for activity change, appetite change and fever.  HENT: Positive for congestion.   Respiratory: Positive for cough.   Gastrointestinal: Positive for diarrhea.  All other systems reviewed and are negative.    Physical Exam Updated Vital Signs BP (!) 92/59 (BP Location: Right Arm)   Pulse 76   Temp 98.4 F (36.9 C) (Rectal)   Resp 18   Ht 4\' 11"  (1.499 m)   Wt 92.1 kg (203 lb)   SpO2 93%   BMI 41.00 kg/m   Physical Exam  Constitutional: She is oriented to person, place, and time. She appears well-developed and well-nourished. No distress.  HENT:  Head: Normocephalic and atraumatic.  Cardiovascular: Normal rate, regular rhythm and normal heart sounds.   No murmur heard. Pulmonary/Chest: Effort normal. No respiratory distress. She has no wheezes. She has no rales.  Abdominal: Soft. She exhibits no distension. There is no tenderness.  Musculoskeletal: She exhibits no edema.  Neurological: She is alert and oriented to person, place, and time.  Skin: Skin is warm and dry.  Nursing note and vitals reviewed.    ED Treatments / Results  Labs (all labs ordered are listed, but only abnormal results are displayed) Labs Reviewed  COMPREHENSIVE METABOLIC PANEL - Abnormal; Notable for the following:       Result Value   Albumin 2.5 (*)    ALT 13 (*)    Total Bilirubin <0.1 (*)    All other  components within normal limits  CBC - Abnormal; Notable for the following:    WBC 12.7 (*)    RBC 3.16 (*)    Hemoglobin 9.7 (*)    HCT 32.2 (*)    MCV 101.9 (*)    RDW 16.5 (*)    All other components within normal limits  CULTURE, BLOOD (ROUTINE X 2)  CULTURE, BLOOD (ROUTINE X 2)  LIPASE, BLOOD  URINALYSIS, ROUTINE W REFLEX MICROSCOPIC  I-STAT CG4 LACTIC ACID, ED    EKG  EKG Interpretation None       Radiology Dg Chest 2 View  Result Date: 12/05/2016 CLINICAL DATA:  Acute onset of cough and fever.  Initial encounter. EXAM: CHEST  2 VIEW COMPARISON:  Chest radiograph performed 11/03/2016 FINDINGS: The lungs are hypoexpanded. Mild vascular crowding is noted. Retrocardiac opacity may reflect atelectasis or possibly mild pneumonia. There is no evidence of pleural effusion or pneumothorax. The heart is borderline enlarged. No acute osseous abnormalities are seen. IMPRESSION: Lungs hypoexpanded. Retrocardiac airspace opacity may reflect atelectasis or possibly mild pneumonia. Borderline cardiomegaly. Electronically Signed   By: Roanna Raider M.D.   On: 12/05/2016 23:46    Procedures Procedures (including critical care time)  Medications Ordered in ED Medications  vancomycin (VANCOCIN) IVPB 1000 mg/200 mL premix (1,000 mg Intravenous New Bag/Given 12/06/16 0338)  sodium chloride 0.9 % bolus 1,000 mL (0 mLs Intravenous Stopped 12/06/16 0325)  ceFEPIme (MAXIPIME) 2 g in dextrose 5 % 50 mL IVPB (0 g Intravenous Stopped 12/06/16 0354)     Initial Impression / Assessment and Plan / ED Course  I have reviewed the triage vital signs and the nursing notes.  Pertinent labs & imaging results that were available during my care of the patient were reviewed by me and considered in my medical decision making (see chart for details).    RAHIMA FLEISHMAN is a 31 y.o. female with hx of CP who presents to ED for cough, congestion and fever x 2 days. Recently admitted last month for CAP and had  been  doing well until 2 days ago. Upon arrival, patient hypotensive 90's/40's. BP does run low, but typically 100-100's/50-60's. Afebrile and no tachycardia. CXR with possible PNA. Given fevers and new cough/congestion at home, clinically concerning for HCAP. O2% running between 88%-93% when I'm in the room. Normal lactic, but does have mild leukocytosis. Blood cultures obtained. Plan was to give IV ABX and admit patient. Spoke with mother about plan who did not believe she needed to be admitted. I discussed my concerns of her blood pressure and oxygen along with new symptoms c/w PNA. Mother agreed with proceeding with admission. I spoke with [facility] Teaching Service who agreed with admission with transfer to [facility]. Apparently mother then called [facility] Emergency Line asking for second opinion and stating she did not want daughter admitted. I have also spoken with Family Medicine Resident who answered this phone call. Given mother is now declining admission, resident and I agree that it would be best to at least give patient IV ABX here in ED, discharged on oral ABX and have her seen in clinic tomorrow. We have arranged appointment for 6/20 at 2:30 PM.  I spoke with mother about my concerns as a provider and the possibility that this may worsen. We discussed the nature, risks and benefits, and alternatives to treatment. I have specifically discussed that without further evaluation I cannot guarantee there is not a life threatening infection occuring. Time was given to allow the opportunity to ask questions and consider the options, and after the discussion, POA (mother) decided to refuse admission and proceed with IV ABX in ED, transition to orals at home and close PCP follow up. I have made mother aware that this is an AMA discharge, but they may return at any time for further evaluation and treatment.  Patient discussed with Dr. Preston Fleeting who agrees with treatment plan.   Final Clinical  Impressions(s) / ED Diagnoses   Final diagnoses:  HCAP (healthcare-associated pneumonia)    New Prescriptions New Prescriptions   LEVOFLOXACIN (LEVAQUIN) 750 MG TABLET    Take 1 tablet (750 mg total) by mouth daily.     Sophea Rackham, Chase Picket, PA-C 12/06/16 1610    Dione Booze, MD 12/06/16 [phone]

## 2016-12-06 NOTE — Progress Notes (Signed)
    Subjective: Michele [name] HPI: Michele [name] is a 31 y.o. female presenting to clinic today for:  [facility]  EDP recommended admission but mother refused.  CXR with possible small opacity in the retrocardiac space that may reflect a  pneumonia.  She had WBC 12.7.  Her O2 was intermittently low as well during evaluation.  Mother reports that she has been looking somewhat tired but perks up in between.  She notes fevers at home to 101F.  She has been using Tylenol suppository to control this.   Patient was started on Levaquin 750 mg.  She is giving her fluids at home.  She is eating some but not a lot.  She denies diarrhea, vomiting.  She reports sparse nonproductive cough.  She reports rhinorrhea.  Social Hx reviewed. MedHx, medications and allergies reviewed.  Please see EMR. ROS: Per HPI  Objective: Office vital signs reviewed. BP 98/60   Pulse 75   Temp 97.2 F (36.2 C) (Axillary)   SpO2 97%   Physical Examination:  General: Awake, alert, well nourished, less animated than usual, No acute distress HEENT: Scant clear rhinorrhea, MMM Cardio: regular rate and rhythm, S1S2 heard, no murmurs appreciated Pulm: shallow breath sounds/ seemingly clear, no wheezes, rhonchi or rales; normal work of breathing on room air  Assessment/ Plan: 31 y.o. female   1. HCAP (healthcare-associated pneumonia).  Her BP runs soft and she is known to have intermittent desaturations on room air.  ED note and results reviewed.  She does have a small leukocytosis.  Her O2 saturation and WOB on exam today are normal.  Her lung exam was baseline for her.  She was less animated so I suspect she has some type of illness going on.  Could be HCAP vs Viral URI.  Mother would like to avoid admission.  Discussed with mother PO fluids.  Recommended that she continue Levaquin.  She will follow up with Dr Leonides [facility]chan[facility]orsey [facility]ck.  Discussed that if she is not perking up by tomorrow, then would  strongly consider direct admission.  Could consider repeating CBC tomorrow to trend white count.  Discussed with mother that if she were to develop fever, SOB, lethargy, or not able to stay hydrated she would need to seek emergent care.  She voiced good understanding.    Raliegh IpAshly M Gottschalk, DO PGY-3, [facility]

## 2016-12-06 NOTE — ED Notes (Signed)
Per PA Marijean NiemannJaime, will wait on in and out cath until fluids have been given.

## 2016-12-07 ENCOUNTER — Ambulatory Visit (INDEPENDENT_AMBULATORY_CARE_PROVIDER_SITE_OTHER): Payer: Medicaid Other | Admitting: Family Medicine

## 2016-12-07 ENCOUNTER — Encounter: Payer: Self-pay | Admitting: Family Medicine

## 2016-12-07 DIAGNOSIS — J189 Pneumonia, unspecified organism: Secondary | ICD-10-CM

## 2016-12-07 NOTE — Progress Notes (Signed)
Subjective: CC: f/u congestion HPI: Patient is a 31 y.o. female with a past medical history of congenital quadriplegia, profound intellectual disabilities, seizure disorder presenting to clinic today for a same day appt for follow up on nasal congestion.  Patient seen in [facility] ED 6/19.  EDP recommended admission but mother refused.  CXR with possible small opacity in the retrocardiac space that may reflect a  pneumonia.  She had WBC 12.7.  Her O2 was intermittently low as well during evaluation.  Patient was started on Levaquin 750 mg.  She is giving her fluids at home.  She is eating some but not a lot.  She denies diarrhea, vomiting.  She reports sparse nonproductive cough.  She reports rhinorrhea.  Since yesterday she is doing better per her mother.  Holding Lasix and cough medicine. Giving Mucinex and antibiotics. Drinking more fluids and drank a whole bottle of Boost and half a sandwich this AM.  Congestion got better. Rhinorrhea improved by still present. No cough. No increased WOB or shortness of breath. No fevers.  More perky than yesterday, but not back to baseline (not yet singing).   Social History: no smoke exposure.    ROS: All other systems reviewed and are negative.  Past Medical History Patient Active Problem List   Diagnosis Date Noted  . HCAP (healthcare-associated pneumonia) 12/06/2016  . Arterial hypotension   . Sepsis (HCC) 11/04/2016  . Other constipation   . Unsteadiness on feet   . Community acquired pneumonia of left lower lobe of lung (HCC) 04/05/2016  . Cough 04/03/2016  . Rhinitis, non-allergic 06/23/2015  . Special screening examination for human papillomavirus (HPV) 07/20/2014  . Amenorrhea 05/19/2014  . 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 Long-term use of high-risk medication 12/24/2012  . Morbid obesity (HCC) 12/24/2012  . 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 URINARY INCONTINENCE 02/25/2010  . FULL  INCONTINENCE OF FECES 02/25/2010  . 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 Hypothyroidism 01/29/2009  . 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 Infantile cerebral palsy (HCC) 01/29/2009  . GASTROESOPHAGEAL REFLUX DISEASE 01/29/2009  . Osteoporosis 01/29/2009  . Seizure disorder (HCC) 01/29/2009    Medications- reviewed and updated Current Outpatient Prescriptions  Medication Sig Dispense Refill  . acetaminophen (TYLENOL) 160 MG/5ML suspension Take 320 mg by mouth every 6 (six) hours as needed for mild pain.     Marland Kitchen BANZEL 40 MG/ML SUSP TAKE 5 MLS BY MOUTH IN THE MORNING AND 5 MLS BY MOUTH AT NIGHT 460 mL 5  . Calcium Carbonate-Vitamin D (CALCARB 600/D) 600-400 MG-UNIT per tablet Take 1 tablet by mouth 2 (two) times daily. 1 tab per tube 2 time a day    . CARNITOR 1 GM/10ML solution TAKE 5 MLS BY MOUTH THREE TIMES DAILY 510 mL 5  . clotrimazole (LOTRIMIN) 1 % cream APPLY EXTERNALLY TO THE AFFECTED AREA TWICE DAILY (Patient taking differently: APPLY EXTERNALLY TO THE AFFECTED AREA TWICE DAILY as needed) 60 g 0  . DEPAKOTE SPRINKLES 125 MG capsule TAKE 8 CAPSULES BY MOUTH TWICE DAILY (Patient taking differently: Take 1,000 mg by mouth 2 (two) times daily. TAKE 8 CAPSULES BY MOUTH TWICE DAILY) 496 capsule 5  . DIASTAT ACUDIAL 20 MG GEL DIAL TO 15 MG, GIVE RECTALLY FOR SEIZURES LASTING 2 MINUTES OR LONGER 2 Package 5  . feeding supplement, ENSURE ENLIVE, (ENSURE ENLIVE) LIQD Take 237 mLs by mouth 3 (three) times daily between meals. 237 mL 12  . fluticasone (FLONASE) 50  MCG/ACT nasal spray SHAKE LIQUID AND USE 2 SPRAYS IN EACH NOSTRIL DAILY 16 g 5  . folic acid (FOLVITE) 1 MG tablet Take 1 mg by mouth every morning. 1 tab per tube daily    . furosemide (LASIX) 20 MG tablet TAKE 1 TABLET(20 MG) BY MOUTH DAILY AS NEEDED FOR FLUID RETENTION 90 tablet 0  . guaiFENesin (MUCINEX) 600 MG 12 hr tablet Take 600 mg by mouth 2 (two) times daily.    Marland Kitchen. guaiFENesin (ROBITUSSIN) 100 MG/5ML SOLN  Take 30 mLs by mouth every 4 (four) hours as needed for cough or to loosen phlegm.    . hydrocortisone cream 0.5 % Apply 1 application topically 2 (two) times daily. (Patient taking differently: Apply 1 application topically 2 (two) times daily as needed for itching. ) 60 g 0  . KEPPRA 100 MG/ML solution TAKE 12 MLS BY MOUTH EVERY MORNING AND 12 MLS EVERY EVENING 816 mL 5  . lactulose (CHRONULAC) 10 GM/15ML solution TAKE 30 ML BY MOUTH TWICE DAILY 1892 mL 0  . levofloxacin (LEVAQUIN) 750 MG tablet Take 1 tablet (750 mg total) by mouth daily. 7 tablet 0  . NYSTATIN powder APPLY TO AFFECTED DIAPER AREAS WITH EVERY DIAPER CHANGE UNTIL RASH CLEARS 60 g 0  . ondansetron (ZOFRAN) 4 MG/5ML solution TAKE 5 MLS FOR PAIN EVERY 8 HOURS AS NEEDED FOR NAUSEA OR VOMITING 50 mL 11  . PROTONIX 40 MG PACK PLACE 10 ML INTO THE FEEDING TUBE DAILY 30 each 0  . TOPAMAX SPRINKLE 25 MG capsule TAKE 6 CAPSULES BY MOUTH EVERY MORNING AND 6 CAPSULES BY MOUTH EVERY EVENING WITH FOOD (Patient taking differently: TAKE 150 MG BY MOUTH EVERY MORNING AND EVENING WITH FOOD) 408 capsule 0   No current facility-administered medications for this visit.     Objective: Office vital signs reviewed. BP (!) 102/59 (BP Location: Right Wrist, Patient Position: Sitting, Cuff Size: Normal)   Pulse 76   Temp 98.1 F (36.7 C) (Axillary)   LMP 12/07/2016 (Exact Date)   SpO2 93%    Physical Examination:  General: Awake, alert, well- nourished, NAD. Grabs my hand during the exam to play patty cake, later is singing in the room.  ENMT:  TMs intact, normal light reflex, no erythema, no bulging. Nasal turbinates moist with scant amount of clear drainage. MMM. Eyes: Conjunctiva non-injected.  Cardio: RRR, no m/r/g noted.  Pulm: No increased WOB.  Shallow breath sounds, CTAB, without wheezes, rhonchi or crackles noted.   Assessment/Plan: HCAP (healthcare-associated pneumonia) Seems to be much better today than yesterday.  BP closer to he  baseline. O2 saturation and breathing seem baseline as well.  Advised to continue antibiotics until completed and Mucinex PRN. Continue hydration. Mom aware of return precautions however I am very pleased with how she looks today (as is mom).    No orders of the defined types were placed in this encounter.   No orders of the defined types were placed in this encounter.   Joanna Puffrystal S. Arnoldo Hildreth PGY-3, [facility]

## 2016-12-07 NOTE — Patient Instructions (Signed)
Michele [name] looks much better. Continue with the antibiotic and the Mucinex. Continue with nasal saline as needed for her nasal congestion  Please follow-up with us as needed if her symptoms worsen or do not resolve over the next week. Overall, she is doing much better than previously and I'm happy to see she is much perkier today.

## 2016-12-08 NOTE — Assessment & Plan Note (Addendum)
Seems to be much better today than yesterday.  BP closer to he baseline. O2 saturation and breathing seem baseline as well.  Advised to continue antibiotics until completed and Mucinex PRN. Continue hydration. Mom aware of return precautions however I am very pleased with how she looks today (as is mom).

## 2016-12-11 LAB — CULTURE, BLOOD (ROUTINE X 2)
Culture: NO GROWTH
SPECIAL REQUESTS: ADEQUATE

## 2016-12-12 ENCOUNTER — Telehealth: Payer: Self-pay | Admitting: Licensed Clinical Social Worker

## 2016-12-12 NOTE — Progress Notes (Addendum)
LCSW received message that patient's mother  [name], needed assistance with paper work for guardianship. Returned call to ConAgra FoodsVoretta Martin [phone] ref. the above consult.  Per Ms. Daphine DeutscherMartin she provided a copy of the guardianship papers indicating she is patient's legal guardian. Papers were scanned into patient's chart at the front desk.  Copy guardianship papers are filed under media section dated 12/11/16.    No further assistance needed  Sammuel Hineseborah Masin Shatto, LCSW Licensed Clinical Social Worker [facility]   [phone] 11:25 AM

## 2016-12-25 ENCOUNTER — Ambulatory Visit (INDEPENDENT_AMBULATORY_CARE_PROVIDER_SITE_OTHER): Payer: Medicaid Other | Admitting: Family

## 2016-12-25 ENCOUNTER — Encounter (INDEPENDENT_AMBULATORY_CARE_PROVIDER_SITE_OTHER): Payer: Self-pay | Admitting: Family

## 2016-12-25 VITALS — BP 126/78 | HR 84

## 2016-12-25 DIAGNOSIS — G808 Other cerebral palsy: Secondary | ICD-10-CM

## 2016-12-25 DIAGNOSIS — G40319 Generalized idiopathic epilepsy and epileptic syndromes, intractable, without status epilepticus: Secondary | ICD-10-CM

## 2016-12-25 DIAGNOSIS — F73 Profound intellectual disabilities: Secondary | ICD-10-CM

## 2016-12-25 MED ORDER — TOPAMAX SPRINKLE 25 MG PO CPSP: ORAL_CAPSULE | ORAL | 5 refills | Status: DC

## 2016-12-25 MED ORDER — DIASTAT ACUDIAL 20 MG RE GEL: RECTAL | 5 refills | Status: DC

## 2016-12-25 MED ORDER — BANZEL 40 MG/ML PO SUSP: ORAL | 5 refills | Status: DC

## 2016-12-25 MED ORDER — KEPPRA 100 MG/ML PO SOLN: ORAL | 5 refills | Status: DC

## 2016-12-25 MED ORDER — DEPAKOTE SPRINKLES 125 MG PO CSDR: DELAYED_RELEASE_CAPSULE | ORAL | 5 refills | Status: DC

## 2016-12-25 NOTE — Progress Notes (Signed)
Patient: Michele [name] MRN: [id number] Sex: female DOB: Oct 31, 1985  Provider: Elveria Rising, NP Location of Care: [facility]  Note type: Routine return visit  History of Present Illness: Referral Source: Dr. Mosetta Putt History from: mother Chief Complaint: 6 month follow up  Michele [name] is a 31 y.o. 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 She was last seen June 26, 2016. Michele [name] had seizures as a child that were associated with high fever. She has quadriparesis and is dependent upon others for activities of daily living. Her mother calls between visits to report seizures. Adalae is taking and tolerating Depakote, Keppra, Topamax and Banzel for her seizure disorder. Mom reports today that Michele [name] had seizures in April when she had pneumonia but that she has not had seizures since then. At that time Michele [name] was taken by ambulance to the ER and admitted for treatment of pneumonia. She had a seizure at home, which is what prompted Mom to call EMS. Mom said that Michele [name] had pneumonia again in May but did not have seizure activity with that illness.   Mom said that Michele [name] has been doing well otherwise. [name] continues to continue a day program and enjoys that activity.She is assisted to get up and walk some at the day program as well as at home. Mom said that Michele [name] has been otherwise healthy since last seen and Mom has no other health concerns today other than previously mentioned.   Review of Systems: Please see the HPI for neurologic and other pertinent review of systems. Otherwise, the following systems are noncontributory including constitutional, eyes, ears, nose and throat, cardiovascular, respiratory, gastrointestinal, genitourinary, musculoskeletal, skin, endocrine, hematologic/lymph, allergic/immunologic and psychiatric.   Past Medical History:  Diagnosis Date  . Cerebral palsy (HCC)   . Hypothyroidism   . Lennox-Gastaut  syndrome (HCC)   . Osteoporosis   . Pneumonia   . Profound mental retardation   . Reflux   . Rickets   . Seizures (HCC)    Hospitalizations: Yes.  , Head Injury: No., Nervous System Infections: No., Immunizations up to date: Yes.   Past Medical History Comments: See history  Surgical History Past Surgical History:  Procedure Laterality Date  . FOOT MASS EXCISION Right   . Gastrocutaneous fistual repair  2012  . PEG TUBE PLACEMENT      Family History family history includes Cancer in her maternal aunt; Heart disease in her maternal grandmother; Hypertension in her mother and paternal grandmother; Stroke in her maternal grandfather. Family History is otherwise negative for migraines, seizures, cognitive impairment, blindness, deafness, birth defects, chromosomal disorder, autism.  Social History Social History   Social History  . Marital status: Single    Spouse name: N/A  . Number of children: 0  . Years of education: N/A   Occupational History  . Disabled     Social History Main Topics  . Smoking status: Never Smoker  . Smokeless tobacco: Never Used  . Alcohol use No  . Drug use: No  . Sexual activity: No   Other Topics Concern  . None   Social History Narrative   0 caffeine drinks    Lives with mother who helps ADLs. And mom's grandson   Graduated from [employer] in 2010. Now attends day program at [employer].    Enjoys playing in water, ripping paper, watching TV, music and going out.    Allergies Allergies  Allergen Reactions  . Carbamazepine Rash  Physical Exam BP 126/78   Pulse 84   LMP 12/07/2016 (Exact Date) Comment: June 2018 General: well developed, well nourished female, seated in a wheelchair, in no evident distress  Head: head normocephalic and atraumatic.  Neck: supple with no carotid or supraclavicular bruits.  Respiratory: clear to auscultation  Cardiovascular: regular rate and rhythm, no murmurs. Her hands, lower legs and feet are  edematous but less so than in previous visits.  Musculoskeletal: no obvious deformities of her limbs, but it is difficult to assess due to edema and adipose tissue covering landmarks on her legs and feet.  Skin: She has a surgical scar on her abdomen. She has a scar on her forehead. She has no upper front teeth. She has no areas of pressure or broken skin. She has no rashes or lesions.   Neurologic Exam  Mental Status: Awake and fully alert. She smiles responsively and laughs spontaneously. She claps her hands repeatedly. She has significant mental retardation. She has no language but occasionally makes vocalizations. Her mother says that she can say "Robbie", her mother's nickname for her, but that word is not intelligible to me. She is unable to follow any commands.  Cranial Nerves: Fundoscopic exam reveals sharp disc margins. Pupils equal, briskly reactive to light. Extraocular movements appeared to be intact. Visual fields appeared full. Turns to localize sounds. Face, tongue, palate move normally and symmetrically. Neck flexion and extension normal.  Motor: She has mild quadriparesis. She has increased tone, particularly in her legs.  Sensory: Withdrawal x 4.  Coordination: She had fairly good fine motor movements with her hands. She could not follow instructions for adequate coordination testing. She did do well with hand clapping but I could not get her to do purposeful grabbing today. Gait and Station: She is wheelchair bound. I did not get her out of her chair today. Reflexes: Diminished and symmetric. It was difficult to assess reflexes with her edema. I could not get plantar reflex response  Impression 1. 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 Congenital quadriplegia 4. Significant intellectual disability   Recommendations for plan of care The patient's previous [facility] records were reviewed. Danaye has neither had nor required imaging or lab studies  since the last visit. 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 She is dependent upon others for all activities of daily living. Zissy had a seizure in April associated with illness. I will make no changes in her treatment plan at this time. Mom will continue to report seizure activity when it occurs. I will see Livie back in follow up in 6 months or sooner if needed. Mom agreed with the plans made today.  The medication list was reviewed and reconciled.  No changes were made in the prescribed medications today.  A complete medication list was provided to the patient's mother.  Allergies as of 12/25/2016      Reactions   Carbamazepine Rash      Medication List       Accurate as of 12/25/16  1:55 PM. Always use your most recent med list.          acetaminophen 160 MG/5ML suspension Commonly known as:  TYLENOL Take 320 mg by mouth every 6 (six) hours as needed for mild pain.   BANZEL 40 MG/ML Susp Generic drug:  Rufinamide TAKE 5 MLS BY MOUTH IN THE MORNING AND 5 MLS BY MOUTH AT NIGHT  CALCARB 600/D 600-400 MG-UNIT tablet Generic drug:  Calcium Carbonate-Vitamin D Take 1 tablet by mouth 2 (two) times daily. 1 tab per tube 2 time a day   CARNITOR 1 GM/10ML solution Generic drug:  levOCARNitine TAKE 5 MLS BY MOUTH THREE TIMES DAILY   clotrimazole 1 % cream Commonly known as:  LOTRIMIN APPLY EXTERNALLY TO THE AFFECTED AREA TWICE DAILY   DEPAKOTE SPRINKLES 125 MG capsule Generic drug:  divalproex TAKE 8 CAPSULES BY MOUTH TWICE DAILY   DIASTAT ACUDIAL 20 MG Gel Generic drug:  diazepam DIAL TO 15 MG, GIVE RECTALLY FOR SEIZURES LASTING 2 MINUTES OR LONGER   feeding supplement (ENSURE ENLIVE) Liqd Take 237 mLs by mouth 3 (three) times daily between meals.   fluticasone 50 MCG/ACT nasal spray Commonly known as:  FLONASE SHAKE LIQUID AND USE 2  SPRAYS IN EACH NOSTRIL DAILY   folic acid 1 MG tablet Commonly known as:  FOLVITE Take 1 mg by mouth every morning. 1 tab per tube daily   furosemide 20 MG tablet Commonly known as:  LASIX TAKE 1 TABLET(20 MG) BY MOUTH DAILY AS NEEDED FOR FLUID RETENTION   guaiFENesin 100 MG/5ML Soln Commonly known as:  ROBITUSSIN Take 30 mLs by mouth every 4 (four) hours as needed for cough or to loosen phlegm.   guaiFENesin 600 MG 12 hr tablet Commonly known as:  MUCINEX Take 600 mg by mouth 2 (two) times daily.   hydrocortisone cream 0.5 % Apply 1 application topically 2 (two) times daily.   KEPPRA 100 MG/ML solution Generic drug:  levETIRAcetam TAKE 12 MLS BY MOUTH EVERY MORNING AND 12 MLS EVERY EVENING   lactulose 10 GM/15ML solution Commonly known as:  CHRONULAC TAKE 30 ML BY MOUTH TWICE DAILY   nystatin powder Generic drug:  nystatin APPLY TO AFFECTED DIAPER AREAS WITH EVERY DIAPER CHANGE UNTIL RASH CLEARS   ondansetron 4 MG/5ML solution Commonly known as:  ZOFRAN TAKE 5 MLS FOR PAIN EVERY 8 HOURS AS NEEDED FOR NAUSEA OR VOMITING   PROTONIX 40 mg/20 mL Pack Generic drug:  pantoprazole sodium PLACE 10 ML INTO THE FEEDING TUBE DAILY   TOPAMAX SPRINKLE 25 MG capsule Generic drug:  topiramate TAKE 6 CAPSULES BY MOUTH EVERY MORNING AND 6 CAPSULES BY MOUTH EVERY EVENING WITH FOOD       Total time spent with the patient was 25 minutes, of which 50% or more was spent in counseling and coordination of care.   Elveria Risingina Hadlyn Amero NP-C

## 2016-12-25 NOTE — Patient Instructions (Signed)
Continue Michele [name]'s seizure medicines as you have been giving them. Let me know if she has any seizures.   Please plan to return for follow up in 6 months or sooner if needed.

## 2016-12-29 ENCOUNTER — Other Ambulatory Visit (INDEPENDENT_AMBULATORY_CARE_PROVIDER_SITE_OTHER): Payer: Self-pay | Admitting: Family

## 2016-12-29 ENCOUNTER — Other Ambulatory Visit: Payer: Self-pay | Admitting: Family Medicine

## 2016-12-29 DIAGNOSIS — G40319 Generalized idiopathic epilepsy and epileptic syndromes, intractable, without status epilepticus: Secondary | ICD-10-CM

## 2016-12-29 DIAGNOSIS — K219 Gastro-esophageal reflux disease without esophagitis: Secondary | ICD-10-CM

## 2016-12-30 ENCOUNTER — Ambulatory Visit (HOSPITAL_COMMUNITY)
Admission: EM | Admit: 2016-12-30 | Discharge: 2016-12-30 | Disposition: A | Discharge status: Home / Self Care | Payer: Medicaid Other | Attending: Internal Medicine | Admitting: Internal Medicine

## 2016-12-30 ENCOUNTER — Encounter (HOSPITAL_COMMUNITY): Payer: Self-pay | Admitting: Emergency Medicine

## 2016-12-30 DIAGNOSIS — H1031 Unspecified acute conjunctivitis, right eye: Secondary | ICD-10-CM | POA: Diagnosis not present

## 2016-12-30 MED ORDER — SULFACETAMIDE SODIUM 10 % OP SOLN: 1.0000 [drp] | OPHTHALMIC | 0 refills | Status: AC

## 2016-12-30 NOTE — ED Provider Notes (Signed)
CSN: [id number]     Arrival date & time 12/30/16  1851 History   None    Chief Complaint  Patient presents with  . Conjunctivitis   (Consider location/radiation/quality/duration/timing/severity/associated sxs/prior Treatment) 31 year old female with past medical history of cerebral palsy comes in with mother for 2 day history of redness in the right eye. Patient is nonverbal. Mother states started noticing red eyes yesterday, with yellow discharge. Denies injury. No URI symptoms such as fever, cough, sneezing, congestion. Mother has not noticed patient avoiding the sunlight to suggest photophobia. Patient can walk with assistance, mother has not noticed patient feeling around for objects directed suggest changes in vision. Has not tried anything for the red eyes. Patient has not been rubbing the eyes.      Past Medical History:  Diagnosis Date  . Cerebral palsy (HCC)   . Hypothyroidism   . Lennox-Gastaut syndrome (HCC)   . Osteoporosis   . Pneumonia   . Profound mental retardation   . Reflux   . Rickets   . Seizures (HCC)    Past Surgical History:  Procedure Laterality Date  . FOOT MASS EXCISION Right   . Gastrocutaneous fistual repair  2012  . PEG TUBE PLACEMENT     Family History  Problem Relation Age of Onset  . Hypertension Mother   . Heart disease Maternal Grandmother        Died at 50  . Stroke Maternal Grandfather        died at 25  . Hypertension Paternal Grandmother   . Diabetes Unknown        MGGM  . Heart disease Unknown        MGGF  . Kidney disease Unknown        Maternal Great Aunt   . Cancer Maternal Aunt        lung  . Colon cancer Neg Hx    Social History  Substance Use Topics  . Smoking status: Never Smoker  . Smokeless tobacco: Never Used  . Alcohol use No   OB History    No data available     Review of Systems  Unable to perform ROS: Patient nonverbal (However, mother answered questions to the best of her abilities. See HPI)     Allergies  Carbamazepine  Home Medications   Prior to Admission medications   Medication Sig Start Date End Date Taking? Authorizing Provider  BANZEL 40 MG/ML SUSP TAKE 5 MLS BY MOUTH IN THE MORNING AND 5 MLS BY MOUTH AT NIGHT 12/25/16  Yes Goodpasture, [name], NP  Calcium Carbonate-Vitamin D (CALCARB 600/D) 600-400 MG-UNIT per tablet Take 1 tablet by mouth 2 (two) times daily. 1 tab per tube 2 time a day   Yes [provider]  CARNITOR 1 GM/10ML solution TAKE 5 MLS BY MOUTH THREE TIMES DAILY 06/26/16  Yes Goodpasture, [name], NP  clotrimazole (LOTRIMIN) 1 % cream APPLY EXTERNALLY TO THE AFFECTED AREA TWICE DAILY Patient taking differently: APPLY EXTERNALLY TO THE AFFECTED AREA TWICE DAILY as needed 01/12/16  Yes Gottschalk, Ashly M, DO  DEPAKOTE SPRINKLES 125 MG capsule TAKE 8 CAPSULES BY MOUTH TWICE DAILY 12/25/16  Yes Elveria Rising, NP  feeding supplement, ENSURE ENLIVE, (ENSURE ENLIVE) LIQD Take 237 mLs by mouth 3 (three) times daily between meals. 04/11/16  Yes Beaulah Dinning, MD  fluticasone (FLONASE) 50 MCG/ACT nasal spray SHAKE LIQUID AND USE 2 SPRAYS IN EACH NOSTRIL DAILY 01/12/16  Yes Gottschalk, Ashly M, DO  folic acid (FOLVITE) 1 MG tablet  Take 1 mg by mouth every morning. 1 tab per tube daily   Yes [provider]  hydrocortisone cream 0.5 % Apply 1 application topically 2 (two) times daily. Patient taking differently: Apply 1 application topically 2 (two) times daily as needed for itching.  10/06/15  Yes Gottschalk, Ashly M, DO  KEPPRA 100 MG/ML solution TAKE 12 MLS BY MOUTH EVERY MORNING AND 12 MLS EVERY EVENING 12/25/16  Yes Elveria Rising, NP  lactulose (CHRONULAC) 10 GM/15ML solution TAKE 30 ML BY MOUTH TWICE DAILY 11/27/16  Yes Gottschalk, Ashly M, DO  NYSTATIN powder APPLY TO AFFECTED DIAPER AREAS WITH EVERY DIAPER CHANGE UNTIL RASH CLEARS 10/27/16  Yes Gottschalk, Ashly M, DO  PROTONIX 40 MG PACK PLACE 10 ML INTO THE FEEDING TUBE DAILY 11/27/16  Yes  Gottschalk, Ashly M, DO  TOPAMAX SPRINKLE 25 MG capsule TAKE 6 CAPSULES BY MOUTH EVERY MORNING AND 6 CAPSULES BY MOUTH EVERY EVENING WITH FOOD 12/25/16  Yes Elveria Rising, NP  acetaminophen (TYLENOL) 160 MG/5ML suspension Take 320 mg by mouth every 6 (six) hours as needed for mild pain.     [provider]  DIASTAT ACUDIAL 20 MG GEL DIAL TO 15 MG, GIVE RECTALLY FOR SEIZURES LASTING 2 MINUTES OR LONGER 12/25/16   Elveria Rising, NP  furosemide (LASIX) 20 MG tablet TAKE 1 TABLET(20 MG) BY MOUTH DAILY AS NEEDED FOR FLUID RETENTION Patient not taking: Reported on 12/25/2016 09/27/16   Raliegh Ip, DO  guaiFENesin (MUCINEX) 600 MG 12 hr tablet Take 600 mg by mouth 2 (two) times daily.    [provider]  guaiFENesin (ROBITUSSIN) 100 MG/5ML SOLN Take 30 mLs by mouth every 4 (four) hours as needed for cough or to loosen phlegm.    [provider]  ondansetron (ZOFRAN) 4 MG/5ML solution TAKE 5 MLS FOR PAIN EVERY 8 HOURS AS NEEDED FOR NAUSEA OR VOMITING 06/29/14   Hess, Twana First, DO  sulfacetamide (BLEPH-10) 10 % ophthalmic solution Place 1-2 drops into the right eye every 4 (four) hours. 12/30/16 01/09/17  Belinda Fisher, PA-C   Meds Ordered and Administered this Visit  Medications - No data to display  BP 107/71 (BP Location: Right Wrist)   Pulse 86   Temp (!) 97.4 F (36.3 C) (Temporal)   Resp 20   LMP 12/07/2016 (Exact Date) Comment: June 2018  SpO2 97%  No data found.   Physical Exam  Constitutional: She appears well-developed and well-nourished. No distress.  Eyes: Pupils are equal, round, and reactive to light.  Patient is nonverbal, and cannot follow directions, examined eye to the best of my abilities.  Lids normal, did not attempt lids eversion. Right eye with injected conjunctiva, left eye without injection. EOM deferred.   Cardiovascular: Normal rate, regular rhythm and normal heart sounds.  Exam reveals no gallop and no friction rub.   No murmur  heard. Pulmonary/Chest: Effort normal and breath sounds normal. She has no wheezes. She has no rales.  Skin: Skin is warm and dry.  Psychiatric: She has a normal mood and affect. Her behavior is normal. Judgment normal.    Urgent Care Course     Procedures (including critical care time)  Labs Review Labs Reviewed - No data to display  Imaging Review No results found.       MDM   1. Acute bacterial conjunctivitis of right eye    Discussed with mother given patient unable to verbalize symptoms, will treat for bacterial conjunctivitis given discharge in the eye.  Start sulfacetamide 1-2 drops in the right eye every 4 hours for 10 days. Instructions given. Mother to monitor for other symptoms such as fever, cough, nasal congestion. Mother also to monitor for patient avoidance to sunlight, feeling around for movement, that can suggest photophobia or vision changes, to follow up with an ophthalmologist for further evaluation.   Belinda FisherYu, Kion Huntsberry V, PA-C 12/30/16 2013

## 2016-12-30 NOTE — Discharge Instructions (Signed)
Monitor for changes in vision, photophobia, to follow up with an ophthalmologist.

## 2016-12-30 NOTE — ED Triage Notes (Signed)
Mom brings pt in for right pink eye onset 2 days  Pt w/hx of Cerebral Palsy ... nonverbal  A&O x4... NAD... Ambulatory

## 2017-01-18 IMAGING — DX DG CHEST 2V
2 series · 2 of 2 positions shown · non-contrast
Comparison: 04/06/2016

CLINICAL DATA: Shortness of breath with dry cough

EXAM:
CHEST  2 VIEW

[chest lat]
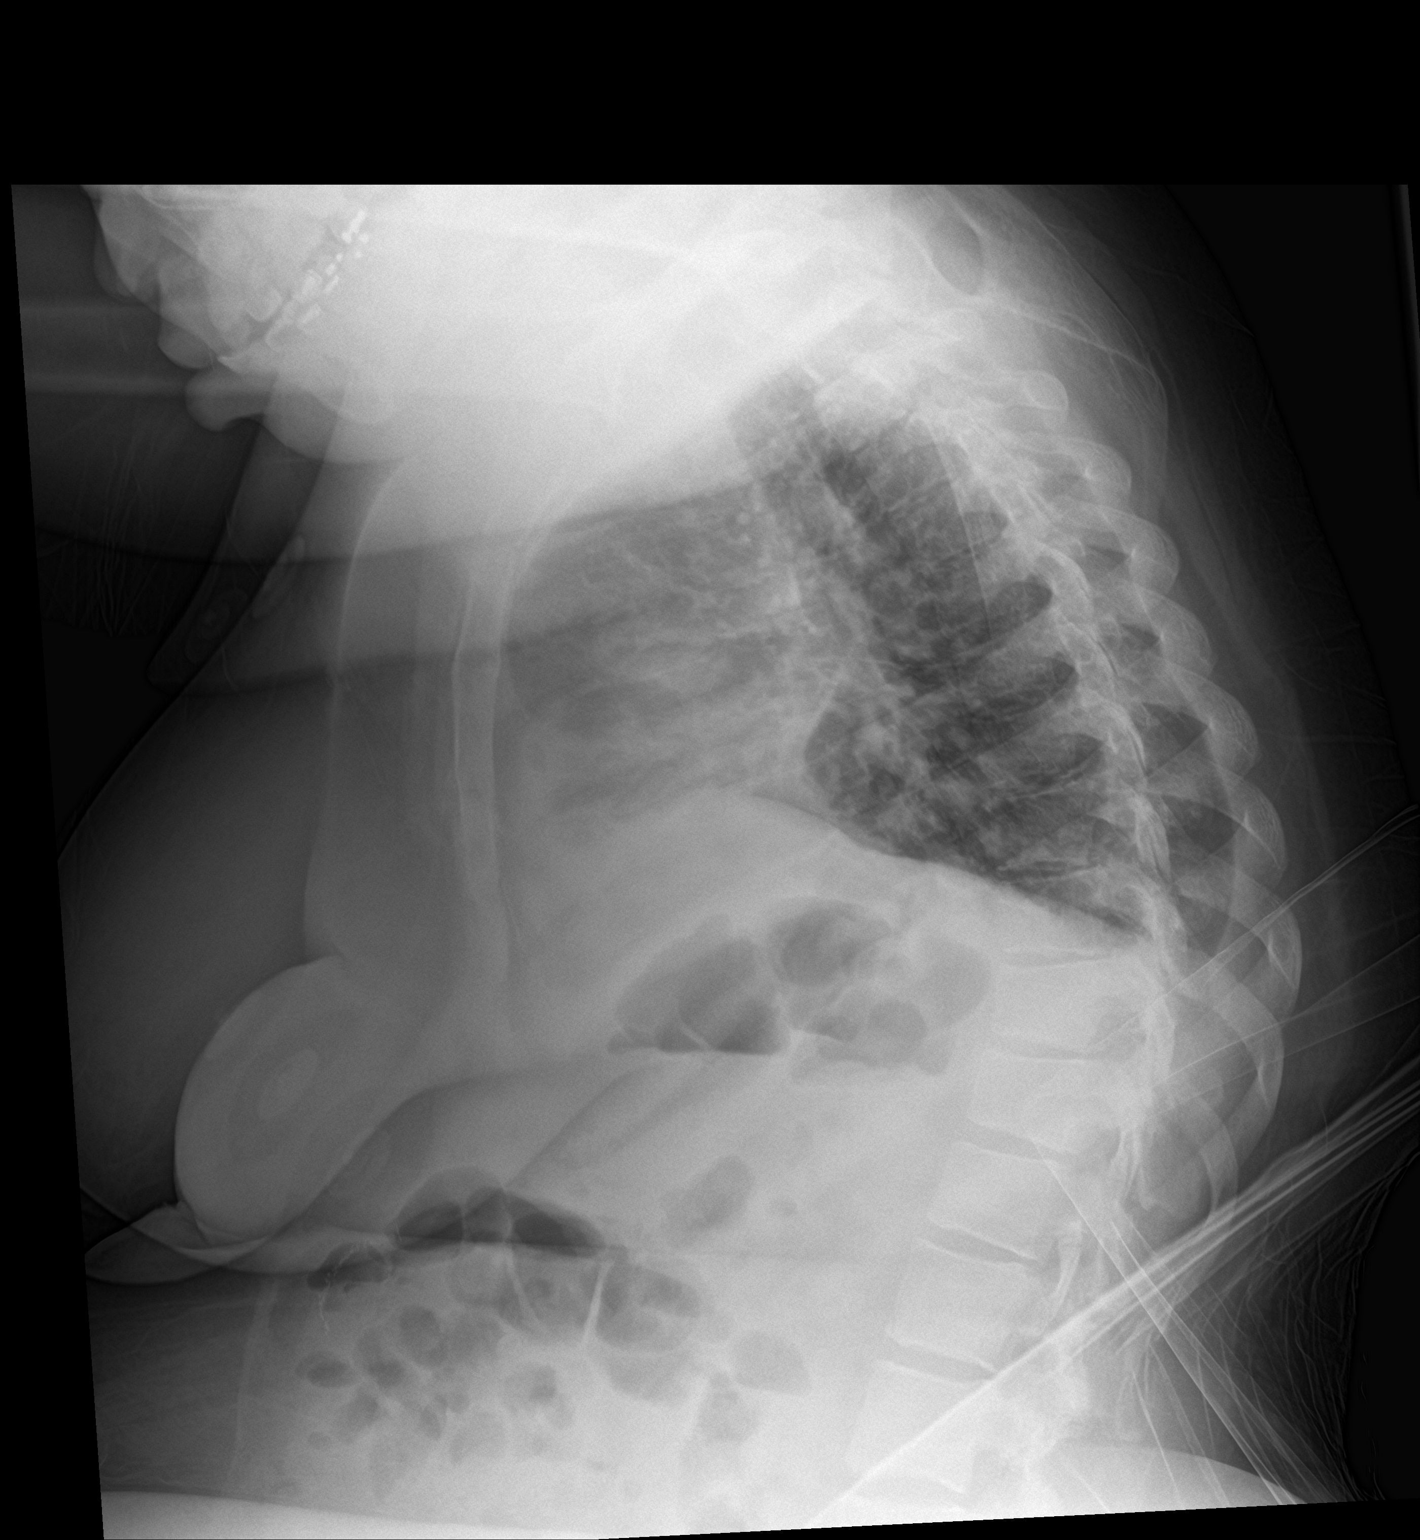

[chest ap]
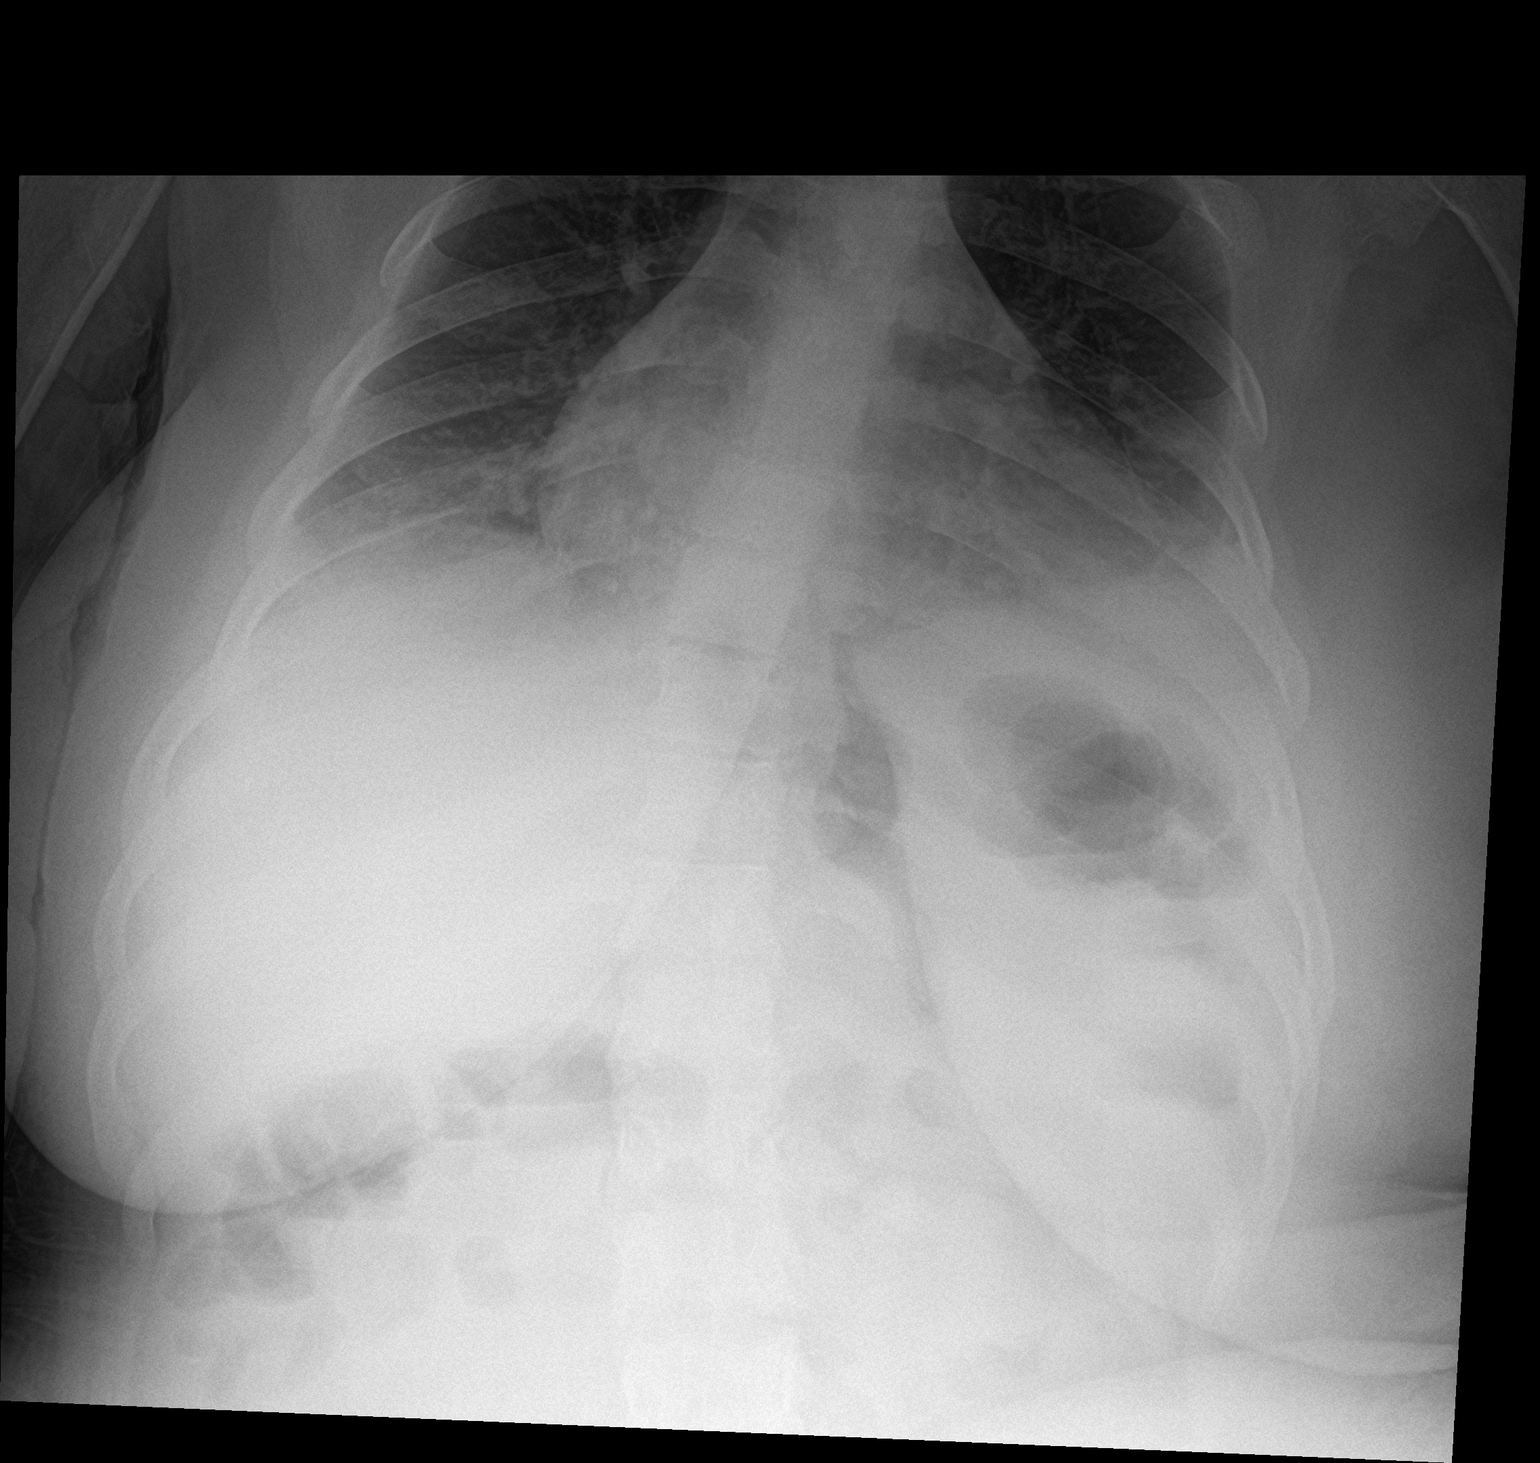

[2 of 2 positions shown; findings below may reference images not displayed]

FINDINGS: There is improved aeration of the left lower lobe with mild
bibasilar atelectasis or consolidations remaining. Small left
effusion. Stable enlargement of the cardiomediastinal none. No
pneumothorax.
IMPRESSION: 1. Improved aeration of the left lower lobe with better
visualization of the left diaphragm. Mild residual bibasilar
atelectasis or infiltrates with small effusion
2. Stable cardiomegaly

## 2017-01-21 NOTE — Progress Notes (Signed)
Subjective:    Patient ID: Michele [name] , female   DOB: 03-06-1986 , 31 y.o..   MRN: [id number]  HPI  Michele [name] is a 31 year old female with PMH of congenital quadriplegia, profound intellectual disabilities, seizure disorder here for   1. Bilateral Leg Swelling: Patient is here with her mother today who is speaking for her. Patient has had bilateral thigh and lower leg swelling for the last 3 months. Mother notes that she has actually had swelling off and on for the last 3 years since her Synthroid was stopped. She was on Lasix for about 2 years but this was stopped about 2 months ago when she saw Dr. Nadine Counts. Mother has been applying TED hose but does not think that has made much of a difference. Patient does not seem in pain when she is stepping with her legs. Mother states that patient does not drink a lot at baseline but does eat well and takes an sure. Sometimes she will try walking with a walker and when she does this or mom thinks that she gets very short of breath. She has had intermittent cough for the last week because she has a cold associated with a runny nose. No fever at home. Mother denies swelling in any other place on the body.  Review of Systems: Per HPI.    Past Medical History: Patient Active Problem List   Diagnosis Date Noted  . Lymphedema 01/22/2017  . HCAP (healthcare-associated pneumonia) 12/06/2016  . Arterial hypotension   . Sepsis (HCC) 11/04/2016  . Other constipation   . Unsteadiness on feet   . Community acquired pneumonia of left lower lobe of lung (HCC) 04/05/2016  . Cough 04/03/2016  . Rhinitis, non-allergic 06/23/2015  . Special screening examination for human papillomavirus (HPV) 07/20/2014  . Amenorrhea 05/19/2014  . 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 Long-term use of high-risk medication 12/24/2012  . Morbid obesity (HCC) 12/24/2012  . 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 URINARY  INCONTINENCE 02/25/2010  . 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 Hypothyroidism 01/29/2009  . 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 Infantile cerebral palsy (HCC) 01/29/2009  . GASTROESOPHAGEAL REFLUX DISEASE 01/29/2009  . Osteoporosis 01/29/2009  . Seizure disorder (HCC) 01/29/2009    Medications: reviewed  Current Outpatient Prescriptions  Medication Sig Dispense Refill  . acetaminophen (TYLENOL) 160 MG/5ML suspension Take 320 mg by mouth every 6 (six) hours as needed for mild pain.     Marland Kitchen BANZEL 40 MG/ML SUSP TAKE 5 MLS BY MOUTH IN THE MORNING AND 5 MLS BY MOUTH AT NIGHT 460 mL 5  . Calcium Carbonate-Vitamin D (CALCARB 600/D) 600-400 MG-UNIT per tablet Take 1 tablet by mouth 2 (two) times daily. 1 tab per tube 2 time a day    . CARNITOR 1 GM/10ML solution TAKE 5 MLS BY MOUTH THREE TIMES DAILY 510 mL 5  . clotrimazole (LOTRIMIN) 1 % cream APPLY EXTERNALLY TO THE AFFECTED AREA TWICE DAILY (Patient taking differently: APPLY EXTERNALLY TO THE AFFECTED AREA TWICE DAILY as needed) 60 g 0  . DEPAKOTE SPRINKLES 125 MG capsule TAKE 8 CAPSULES BY MOUTH TWICE DAILY 496 capsule 5  . DIASTAT ACUDIAL 20 MG GEL DIAL TO 15 MG, GIVE RECTALLY FOR SEIZURES LASTING 2 MINUTES OR LONGER 2 Package 5  . feeding supplement, ENSURE ENLIVE, (ENSURE ENLIVE) LIQD Take 237 mLs by mouth 3 (three) times daily between meals. 237 mL 12  .  fluticasone (FLONASE) 50 MCG/ACT nasal spray SHAKE LIQUID AND USE 2 SPRAYS IN EACH NOSTRIL DAILY 16 g 5  . folic acid (FOLVITE) 1 MG tablet Take 1 mg by mouth every morning. 1 tab per tube daily    . furosemide (LASIX) 20 MG tablet TAKE 1 TABLET(20 MG) BY MOUTH DAILY AS NEEDED FOR FLUID RETENTION (Patient not taking: Reported on 12/25/2016) 90 tablet 0  . guaiFENesin (MUCINEX) 600 MG 12 hr tablet Take 600 mg by mouth 2 (two) times daily.    Marland Kitchen. guaiFENesin (ROBITUSSIN) 100 MG/5ML SOLN Take 30 mLs by mouth every 4 (four) hours as  needed for cough or to loosen phlegm.    . hydrocortisone cream 0.5 % Apply 1 application topically 2 (two) times daily. (Patient taking differently: Apply 1 application topically 2 (two) times daily as needed for itching. ) 60 g 0  . KEPPRA 100 MG/ML solution TAKE 12 MLS BY MOUTH EVERY MORNING AND 12 MLS EVERY EVENING 816 mL 5  . lactulose (CHRONULAC) 10 GM/15ML solution TAKE 30 ML BY MOUTH TWICE DAILY 1892 mL 0  . NYSTATIN powder APPLY TO AFFECTED DIAPER AREAS WITH EVERY DIAPER CHANGE UNTIL RASH CLEARS 60 g 0  . ondansetron (ZOFRAN) 4 MG/5ML solution TAKE 5 MLS FOR PAIN EVERY 8 HOURS AS NEEDED FOR NAUSEA OR VOMITING 50 mL 11  . PROTONIX 40 MG PACK PLACE 10 ML INTO THE FEEDING TUBE DAILY 30 each 0  . TOPAMAX SPRINKLE 25 MG capsule TAKE 6 CAPSULES BY MOUTH EVERY MORNING AND 6 CAPSULES BY MOUTH EVERY EVENING WITH FOOD 408 capsule 5   No current facility-administered medications for this visit.     Social Hx:  reports that she has never smoked. She has never used smokeless tobacco.   Objective:   BP (!) 90/40 (BP Location: Right Wrist, Patient Position: Sitting, Cuff Size: Normal)   Pulse 79   LMP 01/15/2017   SpO2 94%  Physical Exam  Gen: NAD, alert, cooperative with exam, Clapping hands and humming intermittently HEENT: NCAT, PERRL, clear conjunctiva, oropharynx clear, supple neck, mild rhinorrhea Cardiac: Regular rate and rhythm, normal S1/S2, no murmur, capillary refill brisk  Respiratory: Clear to auscultation bilaterally, no wheezes, non-labored breathing Gastrointestinal: soft, non tender, non distended, bowel sounds present Skin: no rashes, normal turgor  Neurological: Nonverbal, pupils equal and reactive to light.  Extremities: Swelling in bilateral lower extremities from hips down, no skin breakdown, no pitting edema. swelling also present in bilateral hands.   Assessment & Plan:  Lymphedema Physical exam showing signs of bilateral lower extremity lymphedema. Patient also has  mild swelling in bilateral hands. Differentials include thyroid abnormalities, hepatic abnormality, glomerulonephropathy, anemia. Less likely heart failure in such a young patient without significant risk factors. No signs of infection of lower extremities. Very unlikely that this is DVT as swelling is bilateral. -TSH, CBC, CMP, BNP drawn today -We'll follow-up with patient and her mother if any of these labs are abnormal -Continue TED hose -Gave mother a list of lymphedema clinics to call and schedule an appointment with -Red flag symptoms and reasons to go to the hospital were discussed -We will avoid any Lasix as patient's blood pressure artery low at 90/40 (normally ranges in the 90s over 50s per mother's report).  Orders Placed This Encounter  Procedures  . CBC with Differential  . Brain natriuretic peptide  . TSH  . Comprehensive metabolic panel    Order Specific Question:   Has the patient fasted?    Answer:  No    Anders Simmondshristina Gambino, MD [facility], PGY-3

## 2017-01-22 ENCOUNTER — Ambulatory Visit (INDEPENDENT_AMBULATORY_CARE_PROVIDER_SITE_OTHER): Payer: Medicaid Other | Admitting: Family Medicine

## 2017-01-22 ENCOUNTER — Encounter: Payer: Self-pay | Admitting: Family Medicine

## 2017-01-22 VITALS — BP 90/40 | HR 79

## 2017-01-22 DIAGNOSIS — I890 Lymphedema, not elsewhere classified: Secondary | ICD-10-CM | POA: Diagnosis not present

## 2017-01-22 NOTE — Assessment & Plan Note (Signed)
Physical exam showing signs of bilateral lower extremity lymphedema. Patient also has mild swelling in bilateral hands. Differentials include thyroid abnormalities, hepatic abnormality, glomerulonephropathy, anemia. Less likely heart failure in such a young patient without significant risk factors. No signs of infection of lower extremities. Very unlikely that this is DVT as swelling is bilateral. -TSH, CBC, CMP, BNP drawn today -We'll follow-up with patient and her mother if any of these labs are abnormal -Continue TED hose -Gave mother a list of lymphedema clinics to call and schedule an appointment with -Red flag symptoms and reasons to go to the hospital were discussed -We will avoid any Lasix as patient's blood pressure artery low at 90/40 (normally ranges in the 90s over 50s per mother's report).

## 2017-01-22 NOTE — Patient Instructions (Signed)
Thank you for coming in today, it was so nice to see you! Today we talked about:    Leg swelling: There are a couple reasons why this could be happening. We are checking her blood today to see if anything comes back abnormal.   Please contact the lymphedema clinic to try and help get some of the fluid off of her legs  Continue the ted hose as you have been doing  Reasons to come to the hospital would be if she is short of breath, fever, or has redness in her legs.   If we ordered any tests today, you will be notified via telephone of any abnormalities. If everything is normal you will get a letter in the mail.   If you have any questions or concerns, please do not hesitate to call the office at [phone]. You can also message me directly via MyChart.   Sincerely,  Anders Simmondshristina Gambino, MD

## 2017-01-23 LAB — CBC WITH DIFFERENTIAL/PLATELET
BASOS: 0 %
Basophils Absolute: 0 10*3/uL (ref 0.0–0.2)
EOS (ABSOLUTE): 0 10*3/uL (ref 0.0–0.4)
Eos: 0 %
Hematocrit: 35.1 % (ref 34.0–46.6)
Hemoglobin: 10.8 g/dL — ABNORMAL LOW (ref 11.1–15.9)
IMMATURE GRANS (ABS): 0.1 10*3/uL (ref 0.0–0.1)
IMMATURE GRANULOCYTES: 1 %
Lymphocytes Absolute: 2.9 10*3/uL (ref 0.7–3.1)
Lymphs: 29 %
MCH: 29.1 pg (ref 26.6–33.0)
MCHC: 30.8 g/dL — ABNORMAL LOW (ref 31.5–35.7)
MCV: 95 fL (ref 79–97)
MONOCYTES: 8 %
MONOS ABS: 0.8 10*3/uL (ref 0.1–0.9)
Neutrophils Absolute: 6.2 10*3/uL (ref 1.4–7.0)
Neutrophils: 62 %
PLATELETS: 339 10*3/uL (ref 150–379)
RBC: 3.71 x10E6/uL — AB (ref 3.77–5.28)
RDW: 16.9 % — AB (ref 12.3–15.4)
WBC: 10 10*3/uL (ref 3.4–10.8)

## 2017-01-23 LAB — BRAIN NATRIURETIC PEPTIDE: BNP: 12.9 pg/mL (ref 0.0–100.0)

## 2017-01-23 LAB — TSH: TSH: 2.84 u[IU]/mL (ref 0.450–4.500)

## 2017-01-29 ENCOUNTER — Other Ambulatory Visit: Payer: Self-pay | Admitting: Family Medicine

## 2017-02-26 ENCOUNTER — Other Ambulatory Visit (INDEPENDENT_AMBULATORY_CARE_PROVIDER_SITE_OTHER): Payer: Self-pay | Admitting: Family

## 2017-02-26 ENCOUNTER — Other Ambulatory Visit: Payer: Self-pay | Admitting: Family Medicine

## 2017-02-26 DIAGNOSIS — K219 Gastro-esophageal reflux disease without esophagitis: Secondary | ICD-10-CM

## 2017-02-26 DIAGNOSIS — G40319 Generalized idiopathic epilepsy and epileptic syndromes, intractable, without status epilepticus: Secondary | ICD-10-CM

## 2017-02-27 ENCOUNTER — Other Ambulatory Visit (INDEPENDENT_AMBULATORY_CARE_PROVIDER_SITE_OTHER): Payer: Self-pay | Admitting: Family

## 2017-02-27 DIAGNOSIS — G40319 Generalized idiopathic epilepsy and epileptic syndromes, intractable, without status epilepticus: Secondary | ICD-10-CM

## 2017-02-27 MED ORDER — TOPAMAX SPRINKLE 25 MG PO CPSP: ORAL_CAPSULE | ORAL | 5 refills | Status: DC

## 2017-02-27 NOTE — Telephone Encounter (Signed)
Previous Rx printed did not have refills on it. TG

## 2017-02-28 ENCOUNTER — Other Ambulatory Visit: Payer: Self-pay | Admitting: Family Medicine

## 2017-02-28 DIAGNOSIS — K219 Gastro-esophageal reflux disease without esophagitis: Secondary | ICD-10-CM

## 2017-03-01 ENCOUNTER — Other Ambulatory Visit: Payer: Self-pay | Admitting: *Deleted

## 2017-03-01 MED ORDER — NYSTATIN 100000 UNIT/GM EX POWD: CUTANEOUS | 0 refills | Status: DC

## 2017-03-26 ENCOUNTER — Telehealth: Payer: Self-pay | Admitting: *Deleted

## 2017-03-26 ENCOUNTER — Encounter: Payer: Self-pay | Admitting: *Deleted

## 2017-03-26 ENCOUNTER — Ambulatory Visit (INDEPENDENT_AMBULATORY_CARE_PROVIDER_SITE_OTHER): Payer: Medicaid Other | Admitting: *Deleted

## 2017-03-26 ENCOUNTER — Encounter: Payer: Self-pay | Admitting: Student in an Organized Health Care Education/Training Program

## 2017-03-26 DIAGNOSIS — Z23 Encounter for immunization: Secondary | ICD-10-CM | POA: Diagnosis not present

## 2017-03-26 NOTE — Telephone Encounter (Signed)
Called and let mom know that letter excusing Adrinne from jury duty is available for pickup at front desk

## 2017-03-26 NOTE — Telephone Encounter (Signed)
Patient's mom need a letter written, patient received letter in mail to report for jury duty on 04/03/17.  Please complete a letter explaining why patient is not able to perform jury duty. Please contact mom on her cell with questions.  Cell number on file is correct.  Clovis Pu, RN

## 2017-03-29 ENCOUNTER — Other Ambulatory Visit: Payer: Self-pay | Admitting: Student in an Organized Health Care Education/Training Program

## 2017-04-27 ENCOUNTER — Telehealth: Payer: Self-pay | Admitting: Student in an Organized Health Care Education/Training Program

## 2017-04-27 NOTE — Telephone Encounter (Signed)
Her Lasix that she has is expired and they are concerned that it may not work properly. Pt. Has a lot of swelling and wants to know if she should go to ED?  She is still moving around but we will not be here this afternoon and just wants to know what to do.  I informed patient if it becomes a problem they should go on to ED.  Pt. Mother number where she can be reached today is # [phone].

## 2017-04-28 ENCOUNTER — Other Ambulatory Visit: Payer: Self-pay | Admitting: Family Medicine

## 2017-04-28 ENCOUNTER — Other Ambulatory Visit: Payer: Self-pay | Admitting: Hematology

## 2017-04-28 DIAGNOSIS — K219 Gastro-esophageal reflux disease without esophagitis: Secondary | ICD-10-CM

## 2017-04-30 ENCOUNTER — Telehealth (INDEPENDENT_AMBULATORY_CARE_PROVIDER_SITE_OTHER): Payer: Self-pay | Admitting: Family

## 2017-04-30 ENCOUNTER — Telehealth: Payer: Self-pay | Admitting: Student in an Organized Health Care Education/Training Program

## 2017-04-30 NOTE — Telephone Encounter (Signed)
(  copy form from last year),dropped off for at front desk for completion.  Verified that patient section of form has been completed.  Last DOS 01/22/17  Placed form in  [place] team folder to be completed by clinical staff.  Lina Sarheryl A Stanley

## 2017-05-01 ENCOUNTER — Other Ambulatory Visit: Payer: Self-pay | Admitting: Student in an Organized Health Care Education/Training Program

## 2017-05-01 DIAGNOSIS — R600 Localized edema: Secondary | ICD-10-CM

## 2017-05-01 MED ORDER — FUROSEMIDE 20 MG PO TABS: ORAL_TABLET | ORAL | 0 refills | Status: DC

## 2017-05-01 NOTE — Telephone Encounter (Signed)
Clinical info completed on medical necessity form.  Place form in Dr. Latanya MaudlinFeng's box for completion.  Sunday SpillersSharon T Rayette Mogg, CMA

## 2017-05-01 NOTE — Telephone Encounter (Signed)
LVM, tried to reach mom but unable to get a hold of her. Please let them know that I reordered the lasix and sent it to her pharmacy. If [name] is having significant swelling that has not improved with lasix she should schedule a visit to be seen.   If she develops shortness of breath she needs to be seen immediately, if we do not have an appointment then in the ED.

## 2017-05-01 NOTE — Telephone Encounter (Signed)
We will have to test Michele [name]'s O2 sat on room air and document her desat at her next office visit

## 2017-05-02 ENCOUNTER — Other Ambulatory Visit: Payer: Self-pay | Admitting: Family Medicine

## 2017-05-02 DIAGNOSIS — K219 Gastro-esophageal reflux disease without esophagitis: Secondary | ICD-10-CM

## 2017-05-02 NOTE — Telephone Encounter (Signed)
The form was completed and faxed as requested. TG 

## 2017-05-02 NOTE — Telephone Encounter (Signed)
2 page fax received from [facility], requesting Elveria Risingina Goodpasture, NP to have physician to sign and date the attached form and return.  Electronic signatures are not accepted.  Fax: ATTN: [facility]          (F) [phone]   Fax has been labeled and placed in [name]'s bin.

## 2017-05-03 ENCOUNTER — Other Ambulatory Visit: Payer: Self-pay | Admitting: Student in an Organized Health Care Education/Training Program

## 2017-05-03 ENCOUNTER — Encounter: Payer: Self-pay | Admitting: Student

## 2017-05-03 ENCOUNTER — Ambulatory Visit: Payer: Medicaid Other | Admitting: Student

## 2017-05-03 ENCOUNTER — Other Ambulatory Visit: Payer: Self-pay

## 2017-05-03 VITALS — BP 98/60 | HR 79 | Temp 96.4°F | Ht 59.0 in | Wt 213.2 lb

## 2017-05-03 DIAGNOSIS — R0689 Other abnormalities of breathing: Secondary | ICD-10-CM | POA: Diagnosis present

## 2017-05-03 DIAGNOSIS — R0989 Other specified symptoms and signs involving the circulatory and respiratory systems: Secondary | ICD-10-CM

## 2017-05-03 DIAGNOSIS — K219 Gastro-esophageal reflux disease without esophagitis: Secondary | ICD-10-CM

## 2017-05-03 NOTE — Patient Instructions (Signed)
It was great seeing you today! We have addressed the following issues today  Pulse ox/oxygen level: Michele [name]'s oxygen is 98%.  She does not need oxygen.  You may continue checking her oxygen as needed at home.  You can let us know if it oxygen is less than 88%.  Fluid on her legs: Continue using compression stockings and elevating the legs.  She can also use the Lasix as needed  If we did any lab work today, and the results require attention, either me or my nurse will get in touch with you. If everything is normal, you will get a letter in mail and a message via . If you don't hear from us in two weeks, please give us a call. Otherwise, we look forward to seeing you again at your next visit. If you have any questions or concerns before then, please call the clinic at [phone].  Please bring all your medications to every doctors visit  Sign up for My Chart to have easy access to your labs results, and communication with your Primary care physician.    Please check-out at the front desk before leaving the clinic.    Take Care,   Dr. Alanda SlimGonfa

## 2017-05-03 NOTE — Telephone Encounter (Signed)
Mom informed and appt is already scheduled for today. Fleeger, Maryjo RochesterJessica Dawn, CMA

## 2017-05-03 NOTE — Progress Notes (Signed)
  Subjective:    Michele [name] is a 31 y.o. old female here for evaluation of oxygen requirements.  Patient is here with her mother  HPI Patient with history of cerebral palsy & seizure disorder.  Patient's mother stated that she received a call [facility] suggesting patient should have an evaluation for oxygen requirement.she has oxygen at home but doesn't use.  She needed oxygen when she was hospitalized for pneumonia about 3 months ago.  She also has history of desaturation after seizure activity.  Patient's mother checks her oxygen at home intermittently.  She says her oxygen saturation around is from as low as 88% to 97%.   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   has a past medical history of Cerebral palsy (HCC), Hypothyroidism, Lennox-Gastaut syndrome (HCC), Osteoporosis, Pneumonia, Profound mental retardation, Reflux, Rickets, and Seizures (HCC).  FH:  Family History  Problem Relation Age of Onset  . Hypertension Mother   . Heart disease Maternal Grandmother        Died at 9064  . Stroke Maternal Grandfather        died at 9379  . Hypertension Paternal Grandmother   . Diabetes Unknown        MGGM  . Heart disease Unknown        MGGF  . Kidney disease Unknown        Maternal Great Aunt   .  Cancer Maternal Aunt        lung  . Colon cancer Neg Hx     SH Social History   Tobacco Use  . Smoking status: Never Smoker  . Smokeless tobacco: Never Used  Substance Use Topics  . Alcohol use: No  . Drug use: No    Review of Systems Review of systems negative except for pertinent positives and negatives in history of present illness above.     Objective:     Vitals:   05/03/17 1632  BP: 98/60  Pulse: 79  Temp: (!) 96.4 F (35.8 C)  TempSrc: Axillary  SpO2: 98%  Weight: 213 lb 3.2 oz (96.7 kg)  Height: 4\' 11"  (1.499 m)   Body mass index is 43.06 kg/m.  Physical Exam GEN: appears well, no ditress EYES: PERRL, EOMI THROAT: MMM NECK: Supple RESP:  No IWOB, CTAB CVS:  RRR, normal S1&S2, no murmurs GI: soft, NT with active BS MSK: Noted venous insufficiency and extremities bilaterally NEURO: Patient with known cerebral palsy, wheelchair-bound    Assessment and Plan:  1. Oxygen saturation greater than 90%: Oxygen saturation 98% on room air today.  Lung exam within normal limits.  Home oxygen saturation ranging from 88-97% at home per mother's report.  Not using home oxygen except when she had a seizure activity a few months ago.  Currently, I do not see the indication for oxygen but defer that decision to PCP.   Return if symptoms worsen or fail to improve.  Almon Herculesaye T Kemaria Dedic, MD 05/03/17 Pager: [phone]

## 2017-05-04 ENCOUNTER — Other Ambulatory Visit: Payer: Self-pay | Admitting: Family Medicine

## 2017-05-04 MED ORDER — FLUTICASONE PROPIONATE 50 MCG/ACT NA SUSP: NASAL | 5 refills | Status: DC

## 2017-05-09 NOTE — Telephone Encounter (Signed)
Contacted pt mom and gave her the below information will wait till next week to see if the form has been completed. Routing to PCP as a reminder to check on this when she gets back to the office.  Lamonte SakaiZimmerman Rumple, Amrit Cress D, [place]

## 2017-05-09 NOTE — Telephone Encounter (Signed)
Dr. Mosetta PuttFeng is currently out of town. Her paper inbox checked, no form seen. Patient will have to wait until Dr. Mosetta PuttFeng is back in office next week as she may have already completed this.

## 2017-05-09 NOTE — Telephone Encounter (Signed)
Patient mom wanted to know if [employer] form filled out yet (O2 already ckd).  Please call her back with any questions at [phone].

## 2017-05-14 NOTE — Telephone Encounter (Signed)
Mom notified that form is ready for pick up in front office. Kinnie FeilL. Cristina Mattern, RN, BSN

## 2017-05-14 NOTE — Telephone Encounter (Signed)
Form completed and handed to Para MarchJeanette (would have put in nurse box but door was closed so there may be a patient visit going on)

## 2017-05-26 ENCOUNTER — Other Ambulatory Visit: Payer: Self-pay | Admitting: Student in an Organized Health Care Education/Training Program

## 2017-05-29 ENCOUNTER — Other Ambulatory Visit: Payer: Self-pay | Admitting: Student in an Organized Health Care Education/Training Program

## 2017-05-29 MED ORDER — NYSTATIN 100000 UNIT/GM EX POWD: CUTANEOUS | 0 refills | Status: DC

## 2017-05-30 ENCOUNTER — Other Ambulatory Visit: Payer: Self-pay

## 2017-05-30 ENCOUNTER — Ambulatory Visit (INDEPENDENT_AMBULATORY_CARE_PROVIDER_SITE_OTHER): Payer: Medicaid Other | Admitting: Student in an Organized Health Care Education/Training Program

## 2017-05-30 ENCOUNTER — Encounter: Payer: Self-pay | Admitting: Student in an Organized Health Care Education/Training Program

## 2017-05-30 VITALS — BP 98/65 | HR 78 | Temp 96.7°F | Ht 59.0 in

## 2017-05-30 DIAGNOSIS — Z0000 Encounter for general adult medical examination without abnormal findings: Secondary | ICD-10-CM

## 2017-05-30 DIAGNOSIS — I890 Lymphedema, not elsewhere classified: Secondary | ICD-10-CM

## 2017-05-30 DIAGNOSIS — R600 Localized edema: Secondary | ICD-10-CM | POA: Diagnosis not present

## 2017-05-30 NOTE — Assessment & Plan Note (Addendum)
May be secondary to anemia (folate and B12 WNL) as well as hypoalbuminemia based on chart review. Was previously considered for lymphedema clinic, however reasonable to start with compression stockings and lasix PRN for now.  - compression stockings - 20 mg lasix as needed LE edema, once daily PRN - monitor for signs of dizziness or low blood pressure on days with lasix, d/w mom - will check CMP today - discussed monitoring for venous stasis ulcers with patient's mom

## 2017-05-30 NOTE — Patient Instructions (Signed)
It was a pleasure seeing you today in our clinic.   Please continue compression stockings. [name] can use 20 mg lasix once daily as needed for lower extremity swelling.   We drew blood work at today's visit. I will call or send you a letter with these results. If you do not hear from me within the next week, please give our office a call.  Our clinic's number is [phone]. Please call with questions or concerns about what we discussed today.  Be well, Dr. Mosetta PuttFeng

## 2017-05-30 NOTE — Progress Notes (Signed)
   CC: bil LE edema  HPI: Michele PineRobin C [name] is a 31 y.o. female with PMH significant for cerebral palsy, paraplegia (congenital), seizure disorder.  Patient presents for healthcare maintenance and ongoing lower extremity edema, bilateral.  Review of Symptoms:  See HPI for ROS.   CC, SH/smoking status, and VS noted.  Objective: BP 98/65   Pulse 78   Temp (!) 96.7 F (35.9 C) (Axillary)   Ht 4\' 11"  (1.499 m)   LMP 05/18/2017 (Exact Date)   SpO2 98%   BMI 43.06 kg/m  GEN: NAD, alert, globally developmentally delayed female in wheelchair RESPIRATORY: clear to auscultation bilaterally with no wheezes, rhonchi or rales, good effort CV: RRR, no m/r/g, +2-3+ LE edema to mid-shin/knee GI: soft, non-tender, non-distended, no hepatosplenomegaly SKIN: warm and dry, no rashes or lesions  Assessment and plan:   Lymphedema May be secondary to anemia (folate and B12 WNL) as well as hypoalbuminemia based on chart review. Was previously considered for lymphedema clinic, however reasonable to start with compression stockings and lasix PRN for now.  - compression stockings - 20 mg lasix as needed LE edema, once daily PRN - monitor for signs of dizziness or low blood pressure on days with lasix, d/w mom - will check CMP today - discussed monitoring for venous stasis ulcers with patient's mom   Orders Placed This Encounter  Procedures  . Comprehensive metabolic panel    Order Specific Question:   Has the patient fasted?    Answer:   No    No orders of the defined types were placed in this encounter.    Howard PouchLauren Niaya Hickok, MD,MS,  PGY2 05/30/2017 4:28 PM

## 2017-05-31 ENCOUNTER — Encounter: Payer: Self-pay | Admitting: Student in an Organized Health Care Education/Training Program

## 2017-05-31 LAB — COMPREHENSIVE METABOLIC PANEL
ALT: 14 IU/L (ref 0–32)
AST: 17 IU/L (ref 0–40)
Albumin/Globulin Ratio: 1.1 — ABNORMAL LOW (ref 1.2–2.2)
Albumin: 3.5 g/dL (ref 3.5–5.5)
Alkaline Phosphatase: 68 IU/L (ref 39–117)
BUN/Creatinine Ratio: 22 (ref 9–23)
BUN: 18 mg/dL (ref 6–20)
Bilirubin Total: <0.2 mg/dL (ref 0.0–1.2)
CALCIUM: 9.6 mg/dL (ref 8.7–10.2)
CO2: 25 mmol/L (ref 20–29)
CREATININE: 0.83 mg/dL (ref 0.57–1.00)
Chloride: 107 mmol/L — ABNORMAL HIGH (ref 96–106)
GFR calc Af Amer: 109 mL/min/{1.73_m2} (ref >59)
GFR, EST NON AFRICAN AMERICAN: 95 mL/min/{1.73_m2} (ref >59)
GLOBULIN, TOTAL: 3.3 g/dL (ref 1.5–4.5)
Glucose: 107 mg/dL — ABNORMAL HIGH (ref 65–99)
Potassium: 4.5 mmol/L (ref 3.5–5.2)
SODIUM: 143 mmol/L (ref 134–144)
Total Protein: 6.8 g/dL (ref 6.0–8.5)

## 2017-06-21 ENCOUNTER — Telehealth (INDEPENDENT_AMBULATORY_CARE_PROVIDER_SITE_OTHER): Payer: Self-pay | Admitting: Family

## 2017-06-21 NOTE — Telephone Encounter (Signed)
°  Who's calling (name and relationship to patient) : [facility] Best contact number: [phone] Provider they see: Goodpasture  Reason for call: To re qualify patient for the portable oxygen, they need the letter for the portable oxygen on company letterhead. Please fax it to [phone]      PRESCRIPTION REFILL ONLY  Name of prescription:  Pharmacy:

## 2017-06-21 NOTE — Telephone Encounter (Signed)
I faxed the letter as requested. TG 

## 2017-06-27 ENCOUNTER — Other Ambulatory Visit (INDEPENDENT_AMBULATORY_CARE_PROVIDER_SITE_OTHER): Payer: Self-pay | Admitting: Family

## 2017-06-27 ENCOUNTER — Other Ambulatory Visit: Payer: Self-pay | Admitting: Family Medicine

## 2017-06-27 ENCOUNTER — Ambulatory Visit (INDEPENDENT_AMBULATORY_CARE_PROVIDER_SITE_OTHER): Payer: Self-pay | Admitting: Family

## 2017-06-27 DIAGNOSIS — G40319 Generalized idiopathic epilepsy and epileptic syndromes, intractable, without status epilepticus: Secondary | ICD-10-CM

## 2017-06-29 ENCOUNTER — Other Ambulatory Visit: Payer: Self-pay

## 2017-06-29 ENCOUNTER — Emergency Department (HOSPITAL_COMMUNITY)
Admission: EM | Admit: 2017-06-29 | Discharge: 2017-06-29 | Disposition: A | Discharge status: Home / Self Care | Payer: Medicare Other | Attending: Emergency Medicine | Admitting: Emergency Medicine

## 2017-06-29 ENCOUNTER — Ambulatory Visit (INDEPENDENT_AMBULATORY_CARE_PROVIDER_SITE_OTHER): Payer: Medicare Other | Admitting: Family Medicine

## 2017-06-29 ENCOUNTER — Encounter (HOSPITAL_COMMUNITY): Payer: Self-pay | Admitting: Emergency Medicine

## 2017-06-29 ENCOUNTER — Emergency Department (HOSPITAL_COMMUNITY): Payer: Medicare Other

## 2017-06-29 ENCOUNTER — Encounter: Payer: Self-pay | Admitting: Family Medicine

## 2017-06-29 DIAGNOSIS — E039 Hypothyroidism, unspecified: Secondary | ICD-10-CM | POA: Diagnosis not present

## 2017-06-29 DIAGNOSIS — R509 Fever, unspecified: Secondary | ICD-10-CM | POA: Diagnosis not present

## 2017-06-29 DIAGNOSIS — Z79899 Other long term (current) drug therapy: Secondary | ICD-10-CM | POA: Insufficient documentation

## 2017-06-29 DIAGNOSIS — B999 Unspecified infectious disease: Secondary | ICD-10-CM | POA: Diagnosis not present

## 2017-06-29 DIAGNOSIS — J3489 Other specified disorders of nose and nasal sinuses: Secondary | ICD-10-CM | POA: Diagnosis not present

## 2017-06-29 DIAGNOSIS — R6889 Other general symptoms and signs: Secondary | ICD-10-CM

## 2017-06-29 DIAGNOSIS — J069 Acute upper respiratory infection, unspecified: Secondary | ICD-10-CM | POA: Diagnosis not present

## 2017-06-29 DIAGNOSIS — R401 Stupor: Secondary | ICD-10-CM

## 2017-06-29 DIAGNOSIS — J111 Influenza due to unidentified influenza virus with other respiratory manifestations: Secondary | ICD-10-CM | POA: Diagnosis not present

## 2017-06-29 DIAGNOSIS — R05 Cough: Secondary | ICD-10-CM | POA: Diagnosis not present

## 2017-06-29 DIAGNOSIS — R0981 Nasal congestion: Secondary | ICD-10-CM | POA: Diagnosis not present

## 2017-06-29 MED ORDER — OSELTAMIVIR PHOSPHATE 75 MG PO CAPS: 75.0000 mg | ORAL_CAPSULE | Freq: Two times a day (BID) | ORAL | 0 refills | Status: DC

## 2017-06-29 NOTE — Discharge Instructions (Signed)
It was our pleasure to provide your ER care today - we hope that you feel better.  Take tamifu as prescribed.   Follow up with primary care doctor in 1 week if symptoms fail to improve/resolve.  Return to ER if worse, increased trouble breathing, other concern.

## 2017-06-29 NOTE — ED Triage Notes (Signed)
Per [facility]: [facility] sent [facility] for possible pneumonia. Patient hx cerebral palsy, her mother states that when she went to pick her up from adult care center yesterday, she was told patient didn't eat anything and just didn't seem like herself. At doctor office this morning, SpO2 91% on RA and patient more lethargic than normal. EMS reports she became even more lethargic en route - placed on 4L O2 [place], which seemed to perk patient up. Patient mentation at baseline according to mother. Patient in no apparent distress, resp e/u, skin warm/dry.

## 2017-06-29 NOTE — Assessment & Plan Note (Addendum)
New acute problem. Further evaluation required.   Acute decline in LOC. Patient briefly arouses with vocal stimulus.  Patient usually with eyes wide open, interactive, vocal, clapping hands, etc..  Decrease in oral intake last two days. Home temp of 100 per mother. Patient breathing quickly at home.   Tachypnea and possible decrease in breath sounds left posterior lung field. Patient with chronical lower SaO2 in low 90s to high 80% on room air.  Not on home oxygen.   Patient has chronic SBP in 90s.   History of HCAP 10/2016 requiring hospitalization and History of enterococcus UTI requiring hospitalization in 2015. Mother agreed to transport [facility] her [facility]  for [facility] evaluation by ED physicians.  Dr Arnesha Schiraldi contact [facility] Charge Nurse, Jeanice LimHolly, to relay relevant concerns to ED.   If ED physician believes patient requires admission, please contact the [facility] for admission to hospital.

## 2017-06-29 NOTE — Progress Notes (Signed)
   Subjective:    Patient ID: Michele [name], female    DOB: [birth year], 32 y.o.   MRN: [id number] Michele PineRobin C [name] is accompanied by mother Sources of clinical information for visit is/are parent and past medical records. Nursing assessment for this office visit was reviewed with the patient for accuracy and revision.  Previous Report(s) Reviewed: historical medical records   HPI  Decreased Level of consciousness - onset two days ago, presentation similar to when she has had pneumonia in past per mother. Patient usually very interactive, clapping hands, responsive to others with vocalizations - somnolent, arousable - decrease per oral intake - coughing - Normal TTE 10/2016 with BNP < 50 - Axillary temp by mother 100 F last night  - History of HCAP earlier in 2018 requiring hospitalization on [facility]   - History of enterococcus UTI in 2015 requiring hospitalization.  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 Last witnessed seizure was several weeks ago.  Usually not arousable at all after seizure for some time.    SH: lives with mother, goes to daycare   Review of Systems No diarrhea Incontinent of urine and feces    Objective:   Physical Exam Vitals:   06/29/17 0947  BP: (!) 90/40  Pulse: 80  Temp: 99 F (37.2 C)  SpO2: 91% on Room Air  Respiratory Rate 32 / min by physician count Gen: eyes closed, arouses briefly to voice with eye openings, no vocalizations, bundled [facility] HEENT: TM's clear, no cervical LAN Lungs: distant and shallow breath, decreased breath sound left posterior lung field,  Abd: soft, NT, distant BS Ext: warm, trace edema bilateral pretibial    Assessment & Plan:  See problem list

## 2017-06-29 NOTE — ED Notes (Signed)
Patient family given.

## 2017-06-29 NOTE — ED Provider Notes (Signed)
[facility] Note   CSN: [id number] Arrival date & time: 06/29/17  1114     History   Chief Complaint Chief Complaint  Patient presents with  . Pneumonia    HPI Ernest PineRobin C Netzel is a 32 y.o. female.  Patient noted w fever 101, and increased cough and nasal congestion in the past day. No definite known ill contacts. Pt with hx CP and congenital quadriplegia. Patient is acting like her normal self with exception of eating less than normal in past day. In ED pt is smiling and alert. Not verbally responsive to questions at baseline - level 5 caveat.    The history is provided by the patient and a parent. The history is limited by the condition of the patient.  Pneumonia     Past Medical History:  Diagnosis Date  . Cerebral palsy (HCC)   . Hypothyroidism   . Lennox-Gastaut syndrome (HCC)   . Osteoporosis   . Pneumonia   . Profound mental retardation   . Reflux   . Rickets   . Seizures [facility](HCC)     Patient Active Problem List   Diagnosis Date Noted  . Acute decrease in level of consciousness 06/29/2017  . Lymphedema 01/22/2017  . HCAP (healthcare-associated pneumonia) 12/06/2016  . Arterial hypotension   . Sepsis (HCC) 11/04/2016  . Other constipation   . Bilateral lower extremity edema 04/21/2016  . Unsteadiness on feet   . Community acquired pneumonia of left lower lobe of lung (HCC) 04/05/2016  . Cough 04/03/2016  . Rhinitis, non-allergic 06/23/2015  . Special screening examination for human papillomavirus (HPV) 07/20/2014  . Amenorrhea 05/19/2014  . 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 Long-term use of high-risk medication 12/24/2012  . Morbid obesity (HCC) 12/24/2012  . 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 URINARY INCONTINENCE 02/25/2010  . Full incontinence of feces 02/25/2010  . 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 Hypothyroidism 01/29/2009  . Profound  intellectual disabilities 01/29/2009  . Infantile cerebral palsy (HCC) 01/29/2009  . GASTROESOPHAGEAL REFLUX DISEASE 01/29/2009  . Osteoporosis 01/29/2009  . Seizure disorder (HCC) 01/29/2009    Past Surgical History:  Procedure Laterality Date  . FOOT MASS EXCISION Right   . Gastrocutaneous fistual repair  2012  . PEG TUBE PLACEMENT      OB History    No data available       Home Medications    Prior to Admission medications   Medication Sig Start Date End Date Taking? Authorizing Provider  acetaminophen (TYLENOL) 160 MG/5ML suspension Take 320 mg by mouth every 6 (six) hours as needed for mild pain.    Yes [provider]  BANZEL 40 MG/ML SUSP TAKE 5 MLS BY MOUTH IN THE MORNING AND 5 MLS BY MOUTH AT NIGHT 06/27/17  Yes Hickling, Deanna ArtisWilliam H, MD  Calcium Carbonate-Vitamin D (CALCARB 600/D) 600-400 MG-UNIT per tablet Take 1 tablet by mouth 2 (two) times daily. 1 tab per tube 2 time a day   Yes [provider]  CARNITOR 1 GM/10ML solution TAKE 5 MLS BY MOUTH THREE TIMES DAILY 06/27/17  Yes Deetta PerlaHickling, William H, MD  cholecalciferol (VITAMIN D) 400 units TABS tablet Take 400 Units by mouth daily.   Yes [provider]  DEPAKOTE SPRINKLES 125 MG capsule TAKE 8 CAPSULES BY MOUTH TWICE DAILY 12/25/16  Yes Goodpasture, Inetta Fermoina, NP  DIASTAT ACUDIAL 20 MG GEL DIAL TO 15 MG, GIVE RECTALLY FOR SEIZURES LASTING 2 MINUTES OR  LONGER 12/25/16  Yes Elveria Rising, NP  feeding supplement, ENSURE ENLIVE, (ENSURE ENLIVE) LIQD Take 237 mLs by mouth 3 (three) times daily between meals. 04/11/16  Yes Beaulah Dinning, MD  fluticasone (FLONASE) 50 MCG/ACT nasal spray SHAKE LIQUID AND USE 2 SPRAYS IN EACH NOSTRIL DAILY 05/04/17  Yes Leland Her, DO  folic acid (FOLVITE) 1 MG tablet Take 1 mg by mouth every morning. 1 tab per tube daily   Yes [provider]  furosemide (LASIX) 20 MG tablet TAKE 1 TABLET(20 MG) BY MOUTH DAILY AS NEEDED FOR FLUID RETENTION 05/01/17  Yes Howard Pouch, MD  guaiFENesin (MUCINEX) 600 MG 12 hr tablet Take 600 mg by mouth 2 (two) times daily as needed for to loosen phlegm.    Yes [provider]  guaiFENesin (ROBITUSSIN) 100 MG/5ML SOLN Take 30 mLs by mouth every 4 (four) hours as needed for cough or to loosen phlegm.   Yes [provider]  KEPPRA 100 MG/ML solution TAKE 12 MLS BY MOUTH EVERY MORNING AND 12 MLS EVERY EVENING 06/27/17  Yes Deetta Perla, MD  lactulose (CHRONULAC) 10 GM/15ML solution TAKE 30 ML BY MOUTH TWICE DAILY 11/27/16  Yes Gottschalk, Ashly M, DO  nystatin (NYSTATIN) powder APPLY TO AFFECTED DIAPER AREAS WITH EVERY DIAPER CHANGE UNTIL RASH CLEARS 05/29/17  Yes Howard Pouch, MD  PROTONIX 40 MG PACK PLACE 10 MLS INTO THE FEEDING TUBE DAILY 05/03/17  Yes Howard Pouch, MD  TOPAMAX SPRINKLE 25 MG capsule TAKE 6 CAPSULES BY MOUTH IN THE MORNING AND 6 CAPSULES BY MOUTH IN THE EVENING WITH FOOD 02/27/17  Yes Elveria Rising, NP  clotrimazole (LOTRIMIN) 1 % cream APPLY EXTERNALLY TO THE AFFECTED AREA TWICE DAILY Patient not taking: Reported on 06/29/2017 01/12/16   Raliegh Ip, DO  hydrocortisone cream 0.5 % Apply 1 application topically 2 (two) times daily. Patient not taking: Reported on 06/29/2017 10/06/15   Delynn Flavin M, DO  ondansetron [facility]) 4 MG/5ML solution TAKE 5 MLS FOR PAIN EVERY 8 HOURS AS NEEDED FOR NAUSEA OR VOMITING 06/29/14   Hess, Twana First, DO    Family History Family History  Problem Relation Age of Onset  . Hypertension Mother   . Heart disease Maternal Grandmother        Died at 41  . Stroke Maternal Grandfather        died at 50  . Hypertension Paternal Grandmother   . Diabetes Unknown        MGGM  . Heart disease Unknown        MGGF  . Kidney disease Unknown        Maternal Great Aunt   . Cancer Maternal Aunt        lung  . Colon cancer Neg Hx     Social History Social History   Tobacco Use  . Smoking status: Never Smoker  . Smokeless tobacco: Never Used    Substance Use Topics  . Alcohol use: No  . Drug use: No     Allergies   Carbamazepine   Review of Systems Review of Systems  Unable to perform ROS: Patient nonverbal  Constitutional: Positive for fever.  HENT: Positive for congestion and rhinorrhea.   Respiratory: Positive for cough.   Gastrointestinal: Negative for vomiting.  non verbal - level 5 caveat   Physical Exam Updated Vital Signs BP 114/87 (BP Location: Right Arm)   Pulse 98   Temp (!) 100.6 F (38.1 C) (Rectal)   Resp 18  Ht 1.499 m (4\' 11" )   LMP 06/22/2017 (Approximate)   SpO2 96%   BMI 43.06 kg/m   Physical Exam  Constitutional: She appears well-developed and well-nourished. No distress.  HENT:  Mouth/Throat: Oropharynx is clear and moist.  Nasal congestion/rhinorrhea.   Eyes: Conjunctivae are normal. No scleral icterus.  Neck: Neck supple. No tracheal deviation present.  Cardiovascular: Normal rate, regular rhythm, normal heart sounds and intact distal pulses.  Pulmonary/Chest: Effort normal and breath sounds normal. No respiratory distress.  Abdominal: Soft. Normal appearance and bowel sounds are normal. She exhibits no distension. There is no tenderness.  Genitourinary:  Genitourinary Comments: No cva tenderness  Musculoskeletal: She exhibits no edema.  Neurological: She is alert.  Skin: Skin is warm and dry. No rash noted. She is not diaphoretic.  Psychiatric: She has a normal mood and affect.  Nursing note and vitals reviewed.    ED Treatments / Results  Labs (all labs ordered are listed, but only abnormal results are displayed) Labs Reviewed - No data to display  EKG  EKG Interpretation None       Radiology Dg Chest 2 View  Result Date: 06/29/2017 CLINICAL DATA:  Not eating, not herself, lethargy increased, 91% oxygen saturation on room air, cough, question pneumonia EXAM: CHEST  2 VIEW COMPARISON:  12/05/2016 FINDINGS: Upper normal size of cardiac silhouette. Mediastinal  contours and pulmonary vascularity normal. Lungs clear. No pleural effusion or pneumothorax. Bones unremarkable. IMPRESSION: No acute infiltrate. Electronically Signed   By: Ulyses Southward M.D.   On: 06/29/2017 12:52    Procedures Procedures (including critical care time)  Medications Ordered in ED Medications - No data to display   Initial Impression / Assessment and Plan / ED Course  I have reviewed the triage vital signs and the nursing notes.  Pertinent labs & imaging results that were available during my care of the patient were reviewed by me and considered in my medical decision making (see chart for details).  Cxr.  Reviewed nursing notes and prior charts for additional history.   With rhinorrhea/ nasal congestion, and cough, low grade fever, feel like flu or flu-like illness is most likely. No infiltrate on cxr. Room air sats mid to upper 90's.   Patient currently appears stable for d/c.      Final Clinical Impressions(s) / ED Diagnoses   Final diagnoses:  None    ED Discharge Orders    None       Cathren Laine, MD 06/29/17 1307

## 2017-07-02 ENCOUNTER — Ambulatory Visit (INDEPENDENT_AMBULATORY_CARE_PROVIDER_SITE_OTHER): Payer: Self-pay | Admitting: Family

## 2017-07-04 ENCOUNTER — Encounter (INDEPENDENT_AMBULATORY_CARE_PROVIDER_SITE_OTHER): Payer: Self-pay | Admitting: Family

## 2017-07-04 ENCOUNTER — Ambulatory Visit (INDEPENDENT_AMBULATORY_CARE_PROVIDER_SITE_OTHER): Payer: Medicare Other | Admitting: Family

## 2017-07-04 VITALS — BP 112/82 | HR 76 | Wt 211.0 lb

## 2017-07-04 DIAGNOSIS — F73 Profound intellectual disabilities: Secondary | ICD-10-CM

## 2017-07-04 DIAGNOSIS — G40814 Lennox-Gastaut syndrome, intractable, without status epilepticus: Secondary | ICD-10-CM

## 2017-07-04 DIAGNOSIS — G809 Cerebral palsy, unspecified: Secondary | ICD-10-CM

## 2017-07-04 DIAGNOSIS — G40319 Generalized idiopathic epilepsy and epileptic syndromes, intractable, without status epilepticus: Secondary | ICD-10-CM | POA: Diagnosis not present

## 2017-07-04 DIAGNOSIS — G808 Other cerebral palsy: Secondary | ICD-10-CM | POA: Diagnosis not present

## 2017-07-04 MED ORDER — TOPAMAX SPRINKLE 25 MG PO CPSP: ORAL_CAPSULE | ORAL | 5 refills | Status: DC

## 2017-07-04 MED ORDER — BANZEL 40 MG/ML PO SUSP: ORAL | 5 refills | Status: DC

## 2017-07-04 MED ORDER — DIASTAT ACUDIAL 20 MG RE GEL: RECTAL | 5 refills | Status: DC

## 2017-07-04 MED ORDER — CARNITOR 1 GM/10ML PO SOLN: ORAL | 5 refills | Status: DC

## 2017-07-04 MED ORDER — DEPAKOTE SPRINKLES 125 MG PO CSDR: DELAYED_RELEASE_CAPSULE | ORAL | 5 refills | Status: DC

## 2017-07-04 MED ORDER — KEPPRA 100 MG/ML PO SOLN: ORAL | 5 refills | Status: DC

## 2017-07-04 NOTE — Progress Notes (Signed)
Patient: Michele [name] MRN: [id number] Sex: female DOB: [birth year]  Provider: Elveria Rising, NP Location of Care: [facility]  Note type: Routine return visit  History of Present Illness: Referral Source: Dr. Mosetta Putt History from: [facility] chart and mom Chief Complaint: Epilepsy  Michele [name] is a 32 y.o. 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 She was last seen December 25, 2016. Michele [name] had seizures as a child that were associated with high fever. She has intellectual delay and quadriparesis and is dependent upon others for activities of daily living. Michele [name] is taking and tolerating brand Depakote, Keppra, Topamax and Banzel for her seizure disorder. She has occasional breakthrough seizures despite this regimen. Mom said that her last seizure occurred about 1 month ago. When Michele [name] has seizures, she usually turns blue and needs blow by oxygen to recover.   Mom reports today that Michele [name] was seen in the ER about 5 days ago for respiratory difficulty and given Tamiflu for that. She said that Michele [name] has improved since taking Tamiflu. She usually attends a day program but has not been for the last week because of illness and exposure to flu. Mom plans for her to return next week if Michele [name] continues to do well. At the day program, she is assisted to walk and Mom feels that Michele [name]'s mobility has improved with them working with her. Mom says that Michele [name] can walk some around her home holding to furniture. She has not experienced any falls.   Michele [name] has been otherwise healthy since she was last seen and Mom has no other health concerns for her today other than previously mentioned.   Review of Systems: Please see the HPI for neurologic and other pertinent review of systems. Otherwise, all other systems were reviewed and were negative.    Past Medical History:  Diagnosis Date  . Cerebral palsy (HCC)   . Hypothyroidism   . Lennox-Gastaut syndrome  (HCC)   . Osteoporosis   . Pneumonia   . Profound mental retardation   . Reflux   . Rickets   . Seizures (HCC)    Hospitalizations: Yes.  , Head Injury: No., Nervous System Infections: No., Immunizations up to date: Yes.   Past Medical History Comments: See history   Surgical History Past Surgical History:  Procedure Laterality Date  . FOOT MASS EXCISION Right   . Gastrocutaneous fistual repair  2012  . PEG TUBE PLACEMENT      Family History family history includes Cancer in her maternal aunt; Diabetes in her unknown relative; Heart disease in her maternal grandmother and unknown relative; Hypertension in her mother and paternal grandmother; Kidney disease in her unknown relative; Stroke in her maternal grandfather. Family History is otherwise negative for migraines, seizures, cognitive impairment, blindness, deafness, birth defects, chromosomal disorder, autism.  Social History Social History   Socioeconomic History  . Marital status: Single    Spouse name: None  . Number of children: 0  . Years of education: None  . Highest education level: None  Social Needs  . Financial resource strain: None  . Food insecurity - worry: None  . Food insecurity - inability: None  . Transportation needs - medical: None  . Transportation needs - non-medical: None  Occupational History  . Occupation: Disabled   Tobacco Use  . Smoking status: Never Smoker  . Smokeless tobacco: Never Used  Substance and Sexual Activity  . Alcohol use: No  . Drug use: No  .  Sexual activity: No  Other Topics Concern  . None  Social History Narrative   0 caffeine drinks    Lives with mother who helps ADLs.   Graduated from [employer] in 2010. Now attends day program at [employer].    Enjoys playing in water, ripping paper, watching TV, music and going out.    Allergies Allergies  Allergen Reactions  . Carbamazepine Rash    Physical Exam BP 112/82   Pulse 76   Wt 211 lb (95.7 kg) Comment: mom  reported  LMP 06/22/2017 (Approximate)   BMI 42.62 kg/m  General: well developed, well nourished obese female, seated in wheelchair, in no evident distress; black hair, brown eyes, even handed Head: normocephalic and atraumatic. Oropharynx benign. No dysmorphic features. Neck: supple with no carotid bruits.  Cardiovascular: regular rate and rhythm, no murmurs. Respiratory: Clear to auscultation bilaterally Abdomen: Bowel sounds present all four quadrants, abdomen soft, non-tender, non-distended. No hepatosplenomegaly or masses palpated. Musculoskeletal: No skeletal deformities or obvious scoliosis. Her limbs are difficult to examine due to edema and excess adipose tissue covering landmarks. Skin: no rashes or neurocutaneous lesions. She has no upper front teeth.   Neurologic Exam Mental Status: Awake and fully alert.  She smiles and laughs spontaneously. She claps her hands repeatedly and when she grasps my hands, she compels me to clap my hands as well. She has no language. She makes some unintelligible vocalizations. She is unable to follow commands or participate in examination.  Cranial Nerves: Fundoscopic exam - red reflex present.  Unable to fully visualize fundus.  Pupils equal briskly reactive to light. Turns to localize visual and auditory stimuli in the periphery. Symmetric facial movements. Neck flexion and extension normal.  Motor: She has mild quadriparesis with increased tone in her legs.  Sensory: Withdrawal x 4 Coordination: Unable to adequately assess but has no tremor reaching for objects.  Gait and Station: I did not get her out of her chair today.  Reflexes: Appear diminished and symmetric but reflexes were difficult to assess due to her adipose tissue and lack of ability to cooperate. I could not illicit plantar reflexes. No clonus.  Impression 1.  Lennox-Gastaut encephalopathy 2.  Generalized convulsive epilepsy 3.  Congenital quadriplegia 4.  Significant intellectual  delay   Recommendations for plan of care The patient's previous [facility] records were reviewed. Michele [name] has neither had nor required imaging or lab studies since the last visit. She is a 32 year old woman with history of Lennox-Gastaut encephalopathy, generalized convulsive epilepsy, congenital quadriplegia and significant intellectual delay. She is taking and tolerating brand Depakote, Keppra, Topamax and Banzel for her seizure disorder. She also takes brand Carnitor and has Diastat for seizure rescue. Michele [name] continues to have intermittent breakthrough seizures, and when they occur, she usually needs oxygen to recover because of cyanosis that occurs with the seizures. Michele [name] will continue on her medications without change for now. I asked Mom to let me know when she has breakthrough seizures. I will see Michele [name] back in follow up in 6 months or sooner if needed. Mom agreed with the plans made today.  The medication list was reviewed and reconciled.  No changes were made in the prescribed medications today.  A complete medication list was provided to the patient's mother.  Allergies as of 07/04/2017      Reactions   Carbamazepine Rash      Medication List        Accurate as of 07/04/17  9:45 AM. Always use  your most recent med list.          acetaminophen 160 MG/5ML suspension Commonly known as:  TYLENOL Take 320 mg by mouth every 6 (six) hours as needed for mild pain.   BANZEL 40 MG/ML Susp Generic drug:  Rufinamide TAKE 5 MLS BY MOUTH IN THE MORNING AND 5 MLS BY MOUTH AT NIGHT   CALCARB 600/D 600-400 MG-UNIT tablet Generic drug:  Calcium Carbonate-Vitamin D Take 1 tablet by mouth 2 (two) times daily. 1 tab per tube 2 time a day   CARNITOR 1 GM/10ML solution Generic drug:  levOCARNitine TAKE 5 MLS BY MOUTH THREE TIMES DAILY   cholecalciferol 400 units Tabs tablet Commonly known as:  VITAMIN D Take 400 Units by mouth daily.   clotrimazole 1 % cream Commonly known as:  LOTRIMIN APPLY  EXTERNALLY TO THE AFFECTED AREA TWICE DAILY   DEPAKOTE SPRINKLES 125 MG capsule Generic drug:  divalproex TAKE 8 CAPSULES BY MOUTH TWICE DAILY   DIASTAT ACUDIAL 20 MG Gel Generic drug:  diazepam DIAL TO 15 MG, GIVE RECTALLY FOR SEIZURES LASTING 2 MINUTES OR LONGER   feeding supplement (ENSURE ENLIVE) Liqd Take 237 mLs by mouth 3 (three) times daily between meals.   fluticasone 50 MCG/ACT nasal spray Commonly known as:  FLONASE SHAKE LIQUID AND USE 2 SPRAYS IN EACH NOSTRIL DAILY   folic acid 1 MG tablet Commonly known as:  FOLVITE Take 1 mg by mouth every morning. 1 tab per tube daily   furosemide 20 MG tablet Commonly known as:  LASIX TAKE 1 TABLET(20 MG) BY MOUTH DAILY AS NEEDED FOR FLUID RETENTION   guaiFENesin 100 MG/5ML Soln Commonly known as:  ROBITUSSIN Take 30 mLs by mouth every 4 (four) hours as needed for cough or to loosen phlegm.   guaiFENesin 600 MG 12 hr tablet Commonly known as:  MUCINEX Take 600 mg by mouth 2 (two) times daily as needed for to loosen phlegm.   hydrocortisone cream 0.5 % Apply 1 application topically 2 (two) times daily.   KEPPRA 100 MG/ML solution Generic drug:  levETIRAcetam TAKE 12 MLS BY MOUTH EVERY MORNING AND 12 MLS EVERY EVENING   lactulose 10 GM/15ML solution Commonly known as:  CHRONULAC TAKE 30 ML BY MOUTH TWICE DAILY   nystatin powder Commonly known as:  nystatin APPLY TO AFFECTED DIAPER AREAS WITH EVERY DIAPER CHANGE UNTIL RASH CLEARS   ondansetron 4 MG/5ML solution Commonly known as:  ZOFRAN TAKE 5 MLS FOR PAIN EVERY 8 HOURS AS NEEDED FOR NAUSEA OR VOMITING   oseltamivir 75 MG capsule Commonly known as:  TAMIFLU Take 1 capsule (75 mg total) by mouth every 12 (twelve) hours.   PROTONIX 40 mg/20 mL Pack Generic drug:  pantoprazole sodium PLACE 10 MLS INTO THE FEEDING TUBE DAILY   TOPAMAX SPRINKLE 25 MG capsule Generic drug:  topiramate TAKE 6 CAPSULES BY MOUTH IN THE MORNING AND 6 CAPSULES BY MOUTH IN THE EVENING  WITH FOOD       Total time spent with the patient was 20 minutes, of which 50% or more was spent in counseling and coordination of care.   Elveria Risingina Alishah Schulte NP-C

## 2017-07-06 ENCOUNTER — Encounter (INDEPENDENT_AMBULATORY_CARE_PROVIDER_SITE_OTHER): Payer: Self-pay | Admitting: Family

## 2017-07-06 DIAGNOSIS — G40814 Lennox-Gastaut syndrome, intractable, without status epilepticus: Secondary | ICD-10-CM | POA: Insufficient documentation

## 2017-07-06 DIAGNOSIS — G40812 Lennox-Gastaut syndrome, not intractable, without status epilepticus: Secondary | ICD-10-CM | POA: Insufficient documentation

## 2017-07-06 NOTE — Patient Instructions (Signed)
Thank you for bringing Michele [name] in today.   Instructions for you until your next appointment are as follows: 1. Continue her seizure medicines as you have been giving them.  2. Let me know when she has seizures and if you had to give her oxygen for her to recover.  3. Please sign up for MyChart if you have not done so 4. Please plan to return for follow up in 6 months or sooner if needed.

## 2017-07-12 ENCOUNTER — Telehealth: Payer: Self-pay | Admitting: Student in an Organized Health Care Education/Training Program

## 2017-07-12 NOTE — Telephone Encounter (Signed)
The patient needs a referral to Lymphedema clinic at [address], [place], [place].  Their fax # is [phone] (attn: Debbie).  thanks

## 2017-07-13 ENCOUNTER — Other Ambulatory Visit: Payer: Self-pay | Admitting: Student in an Organized Health Care Education/Training Program

## 2017-07-13 DIAGNOSIS — I890 Lymphedema, not elsewhere classified: Secondary | ICD-10-CM

## 2017-07-13 NOTE — Telephone Encounter (Signed)
Can we please put this referral so I can send this out please,

## 2017-07-13 NOTE — Telephone Encounter (Signed)
Referral placed to [facility] at [facility] in [place], which offers lymphedema services.

## 2017-07-18 ENCOUNTER — Ambulatory Visit: Payer: Medicaid Other | Admitting: Occupational Therapy

## 2017-07-23 ENCOUNTER — Ambulatory Visit: Payer: Medicare Other | Attending: Family Medicine | Admitting: Occupational Therapy

## 2017-07-23 ENCOUNTER — Other Ambulatory Visit: Payer: Self-pay

## 2017-07-23 ENCOUNTER — Encounter: Payer: Self-pay | Admitting: Occupational Therapy

## 2017-07-23 DIAGNOSIS — I890 Lymphedema, not elsewhere classified: Secondary | ICD-10-CM | POA: Diagnosis not present

## 2017-07-24 NOTE — Patient Instructions (Signed)

## 2017-07-24 NOTE — Therapy (Signed)
[facility] [address] [place], [place], [address] Phone: [phone]   Fax:  [phone]  Occupational Therapy Evaluation  Patient Details  Name: Michele PineRobin C [name] MRN: [id number] Date of Birth: 1985/07/27 Referring Provider: Howard PouchLauren Feng, MD   Encounter Date: 07/23/2017  OT End of Session - 07/24/17 0844    Visit Number  1    Number of Visits  36    Date for OT Re-Evaluation  10/11/17    Activity Tolerance  Patient tolerated treatment well;No increased pain;Other (comment) limited by impaired    mobility, ambulation , language limitations and significant intellectual delay    Behavior During Therapy  Restless       Past Medical History:  Diagnosis Date  . Cerebral palsy (HCC)   . Hypothyroidism   . Lennox-Gastaut syndrome (HCC)   . Osteoporosis   . Pneumonia   . Profound mental retardation   . Reflux   . Rickets   . Seizures (HCC)     Past Surgical History:  Procedure Laterality Date  . FOOT MASS EXCISION Right   . Gastrocutaneous fistual repair  2012  . PEG TUBE PLACEMENT      There were no vitals filed for this visit.  Subjective Assessment - 07/23/17 1357    Subjective   Michele [name] is referred   to Occupational Therapy for evaluation and treatment of BLE lymphedema by Howard PouchLauren Feng, MD.  Michele [name] is accompanied by her mother, who provides historical information on behalf of Michele [name] as she is non-verbal. Pt presents in manual hospital transport wc with static front rigging. Michele [name]'s mother reports that Michele [name] just obtained a new manual w/c that she keeps at home. She is reluctant to send it to her day program for fear it will get damaged or parts will get misplaced. The w/c reportedly is equipped with rilt in space, but not elevatinf leg rests for front rigging. Michele [name] has not previously undergone lymphedema treatment. Michele [name] has worn TEDS stockings in the past to address LE, but  her existing TEDS long longer fit. They roll  down and cause discomfort and are no longer well tolerated.  Michele [name]'s mother's stated goal for OT for CDT is " to see how some of this swelling can go down and how we can keep it down."    Patient is accompained by:  Family member    Pertinent History  CP, Hypothyroidism, Lennox-Gastaut Syndrome, Osteoporosis, hx pneumonia, profound MR, rickets, seizure discorder, R foot         [facility] OT Assessment - 07/24/17 0001      Assessment   Medical Diagnosis  Mild-moderate, BLE, stage II, lymphedema- - onset unsure    Referring Provider  Howard PouchLauren Feng, MD    Prior Therapy  TEDS      Precautions   Precaution Comments  seizure disorder, Fall risk, hx pneumonia      Restrictions   Weight Bearing Restrictions  No      Balance Screen   Has the patient fallen in the past 6 months  No      Home  Environment   Additional Comments  accessible bathroom    Lives With  Other (Comment) Mother      Prior Function   Level of Independence  Needs assistance with transfers;Needs assistance with gait;Needs assistance with ADLs    Vocation  Other (comment) attends local day program      Vision - History   Baseline Vision  Other (comment) unknown       Mild-Moderate, Stage II, BLE lymphedema, L>R, secondary to unknown etiology   Skin Description Hyper-Keratosis Peau' de Orange Shiny Tight Fibrotic Fatty Doughy Indurated    x x x Moderately dense palpable fibrosis below knees to ankles. L>R       Hydration Dry Flaky Erythema Other   mildly x     Color Redness Present Pallor Blanching Hemosiderin Staining Other      Slight skin darkening at distal legs    Odor Malodorous Yeast Present Absent      x   Temperature Warm Cool Normal     x   Pitting Edema  NON PITTING   1+ 2+ 3+ 4+ >4            Girth Symmetrical Asymmetrical Other Distribution    L>R    Stemmer Sign Positive Negative   Strong + , L>R    Lymphorrea History Of:  Present Absent     Absent , at present, but + hx    Wounds  History Of Present Absent Venous Arterial Pressure Size   denies   x          Signs of Infection Redness Warmth Erythema Acute Swelling Drainage absent                x   Scars Adhesions Hypersensitivity        Sensation Light Touch Deep pressure Hypersensitivty   Present Impaired Present Impaired Present Impaired   WNL   WNL- tender  WNL   x  Nails WNL Fungus Present Other   x     Hair Growth Symmetrical Asymmetrical   x    Skin Creases Base pf toes  Base of Fingers       mild          OT Education - 07/24/17 0843    Education provided  Yes    Education Details  Provided Pt/caregiver skilled education and ADL training throughout visit for lymphedema etiology, progression, and treatment including Intensive and Management Phase Complete Decongestive Therapy (CDT)  Discussed lymphedema precautions, cellulitis risk, and all CDT and LE self-care components, including compression wrapping/ garments & devices, lymphatic pumping ther ex, simple self-MLD, and skin care. Provided printed Lymphedema Workbook for reference.    Person(s) Educated  Patient;Parent(s)    Methods  Explanation;Demonstration    Comprehension  Verbalized understanding;Returned demonstration;Need further instruction          OT Long Term Goals - 07/24/17 0859      OT LONG TERM GOAL #1   Title  Primary caregiver modified independent w/ lymphedema precautions and prevention principals and strategies using printed reference to limit LE progression and infection risk.    Baseline  Max A    Time  2    Period  Weeks    Status  New    Target Date  -- 10th OT visit for CDT      OT LONG TERM GOAL #2   Baseline  Max A    Time  2    Period  Weeks    Status  New    Target Date  -- 10th OT visit for CDT      OT LONG TERM GOAL #3   Baseline  Dependent     Time  12    Period  Weeks    Status  New    Target Date  10/11/17      OT LONG  TERM GOAL #4   Time  12    Period  Weeks    Status  New    Target  Date  10/11/17      OT LONG TERM GOAL #5   Baseline  dependent    Time  6    Period  Months    Status  New    Target Date  01/19/18            Plan - 07/24/17 1121    Clinical Impression Statement  Michele [name] is a 15 Y O female presenting with mild-moderate, stage II, BLE lymphedema secondary to unknown etiology. Moth reports onset in childhood without known precipitating event. Family hx of leg swelling is unknown,. Swelling has worsened over theyears and worsens as the day progresses.     Occupational Profile and client history currently impacting functional performance  intractible seizures, CP, intelectual delay, quadriparesis, dependent on others for all besic and instrumental ADLs    Occupational performance deficits (Please refer to evaluation for details):  ADL's;IADL's;Work;Play;Leisure;Social Participation;Rest and Sleep;Education    Rehab Potential  Good    Current Impairments/barriers affecting progress:  impaired intellectual delay and quadraparesis    OT Frequency  3x / week    OT Treatment/Interventions  Self-care/ADL training;DME and/or AE instruction;Manual Therapy;Patient/family education;Manual lymph drainage;Compression bandaging;Therapeutic activities;Other (comment) BLE skin care during MLD with low ph Eucerin lotion and Palma Christii, skin grade castor oil.    Clinical Decision Making  Multiple treatment options, significant modification of task necessary    Recommended Other Services  Fit with appropriate, knee length, BLE compression garments and or devices- For daytime consider custom, flat knit, open toe, Jobst Elvarex elastic classic stockings  And /Or adjustable, Velcro close, CircAid, short stretch leg and foot piece for ease of donning and doffing. Fit w/ HOS devices, ( consider Jobst, ccl 2 Relax) to limit fibrosis formation and improve lymphatic transport during HOS.       Patient will benefit from skilled therapeutic intervention in order to improve  the following deficits and impairments:  Decreased knowledge of use of DME, Decreased skin integrity, Increased edema, Decreased knowledge of precautions, Decreased safety awareness, Difficulty walking, Obesity, Decreased balance  Visit Diagnosis: Lymphedema, not elsewhere classified - Plan: Ot plan of care cert/re-cert    Problem List Patient Active Problem List   Diagnosis Date Noted  . Lennox-Gastaut syndrome (HCC) 07/06/2017  . Acute decrease in level of consciousness 06/29/2017  . Lymphedema 01/22/2017  . HCAP (healthcare-associated pneumonia) 12/06/2016  . Arterial hypotension   . Sepsis (HCC) 11/04/2016  . Other constipation   . Bilateral lower extremity edema 04/21/2016  . Unsteadiness on feet   . Community acquired pneumonia of left lower lobe of lung (HCC) 04/05/2016  . Cough 04/03/2016  . Rhinitis, non-allergic 06/23/2015  . Special screening examination for human papillomavirus (HPV) 07/20/2014  . Amenorrhea 05/19/2014  . 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 Long-term use of high-risk medication 12/24/2012  . Morbid obesity (HCC) 12/24/2012  . 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 URINARY INCONTINENCE 02/25/2010  . Full incontinence of feces 02/25/2010  . 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 Hypothyroidism 01/29/2009  . 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 Infantile cerebral palsy (HCC) 01/29/2009  . GASTROESOPHAGEAL REFLUX DISEASE 01/29/2009  . Osteoporosis 01/29/2009  . Seizure disorder (HCC) 01/29/2009    Michele [name] 07/24/2017, 11:45 AM  [place] [facility] MAIN [facility] SERVICES [address] [place], [place], [address] Phone: [phone]   Fax:  [phone]  Name: Michele [name] MRN: [id number] Date of Birth: Feb 18, 1986

## 2017-07-30 ENCOUNTER — Ambulatory Visit: Payer: Medicare Other | Admitting: Occupational Therapy

## 2017-07-30 DIAGNOSIS — I890 Lymphedema, not elsewhere classified: Secondary | ICD-10-CM | POA: Diagnosis not present

## 2017-07-30 NOTE — Patient Instructions (Signed)
CG instructed to leave wraps in place overnight and we will remove in clinic. CG instructed  To remove wraps PRN if Pt shows signs of discomfort or pain, and to remove PRN to inspect skin.

## 2017-07-30 NOTE — Therapy (Signed)
[facility] MAIN [facility] SERVICES [address] [place], [place], [address] Phone: [phone]   Fax:  [phone]  Occupational Therapy Treatment  Patient Details  Name: Michele [name] MRN: [id number] Date of Birth: [birth year] Referring Provider: Howard Pouch, MD   Encounter Date: 07/30/2017  OT End of Session - 07/30/17 1124    Visit Number  2    Number of Visits  36    Date for OT Re-Evaluation  10/11/17    OT Start Time  0900    OT Stop Time  1010    OT Time Calculation (min)  70 min    Activity Tolerance  Patient tolerated treatment well;No increased pain;Other (comment) limited by impaired    mobility, ambulation , language limitations and significant intellectual delay    Behavior During Therapy  [facility] for tasks assessed/performed       Past Medical History:  Diagnosis Date  . Cerebral palsy (HCC)   . Hypothyroidism   . Lennox-Gastaut syndrome (HCC)   . Osteoporosis   . Pneumonia   . Profound mental retardation   . Reflux   . Rickets   . Seizures (HCC)     Past Surgical History:  Procedure Laterality Date  . FOOT MASS EXCISION Right   . Gastrocutaneous fistual repair  2012  . PEG TUBE PLACEMENT      There were no vitals filed for this visit.  Subjective Assessment - 07/30/17 1112    Subjective   Michele [name] presents for OT visit 2/ 36 for CDT to address BLE lymphedema. Pt is accommpanied by her mother today. Pt showes no signs or symptoms of pain or discomfort. New transport wc is without cpreasure relieving seat cushion. Mother is in agreement with recommendation to contact DME provider  to endure cushion is fitted on seat pan.    Patient is accompained by:  Family member    Pertinent History  CP, Hypothyroidism, Lennox-Gastaut Syndrome, Osteoporosis, hx pneumonia, profound MR, rickets, seizure discorder, R foot     Currently in Pain?  No/denies          LYMPHEDEMA/ONCOLOGY QUESTIONNAIRE - 07/30/17 1125      Lymphedema  Assessments   Lymphedema Assessments  Lower extremities      Right Lower Extremity Lymphedema   Other  RLE ankle-to popliteal (A-D) limb volume measures 2831.7 ml.      Left Lower Extremity Lymphedema   Other  LLE ankle-to popliteal (A-D) limb volume measures 2913.8 ml.    Other  Baseline Limb volume differential (LVD) measures 2,8%, L>R              OT Treatments/Exercises (OP) - 07/30/17 0001      ADLs   ADL Education Given  Yes    General Comments  Lymphed3ema self care edu to CG      Manual Therapy   Manual Therapy  Edema management;Compression Bandaging    Manual therapy comments  Baseline, knee length, BLE comparative limb volumetrics    Edema Management  skin care to LLE w/ low ph Eucerin lotion prior ro applying compression bandages    Compression Bandaging  12 cm Rosidal foam applied over cotton stockinet to tibial tuberosity, overlapping equally and w/ only enopugh tensioon to ensure close contour without wronkles and bunching.. 10 and 12 cm compression wraps applied inm graduated layers using gradient techniques. Stretch net over all.             OT Education - 07/30/17 1122  Education provided  Yes    Education Details  CGedu for LE self care throughout session included explanation of baseline volumetrics outcomes and how we measure limb volume in the future to track progress towards goals. Skilled edu for proper gradient compression wrap techniques.     Person(s) Educated  Parent(s)    Methods  Explanation;Demonstration    Comprehension  Verbalized understanding;Returned demonstration          OT Long Term Goals - 07/24/17 0859      OT LONG TERM GOAL #1   Title  Primary caregiver modified independent w/ lymphedema precautions and prevention principals and strategies using printed reference to limit LE progression and infection risk.    Baseline  Max A    Time  2    Period  Weeks    Status  New    Target Date  -- 10th OT visit for CDT      OT  LONG TERM GOAL #2   Baseline  Max A    Time  2    Period  Weeks    Status  New    Target Date  -- 10th OT visit for CDT      OT LONG TERM GOAL #3   Baseline  Dependent     Time  12    Period  Weeks    Status  New    Target Date  10/11/17      OT LONG TERM GOAL #4   Time  12    Period  Weeks    Status  New    Target Date  10/11/17      OT LONG TERM GOAL #5   Baseline  dependent    Time  6    Period  Months    Status  New    Target Date  01/19/18            Plan - 07/30/17 1129    Clinical Impression Statement  Kafi tolerated all aspects of OT for LE care today, including skin care, compression wrap application to LLE, CG edu and volumetric measurements. Baseline Limb volume differential (LVD) measures 2,8%, L>R. Swelling and tissue density is distributed  generally below both knees. LLE slightly larger than RLE, and LLE is palpably denser than the RLE. Caregiver thouroughly engaged throughout session. She assisted Pt w/ all SPT, with dressing and verbalized understanding of LVD results. Cont as per POC.     Occupational Profile and client history currently impacting functional performance  intractible seizures, CP, intelectual delay, quadriparesis, dependent on others for all besic and instrumental ADLs    Occupational performance deficits (Please refer to evaluation for details):  ADL's;IADL's;Work;Play;Leisure;Social Participation;Rest and Sleep;Education    Rehab Potential  Good    Current Impairments/barriers affecting progress:  impaired intellectual delay and quadraparesis    OT Frequency  3x / week    OT Treatment/Interventions  Self-care/ADL training;DME and/or AE instruction;Manual Therapy;Patient/family education;Manual lymph drainage;Compression bandaging;Therapeutic activities;Other (comment) BLE skin care during MLD with low ph Eucerin lotion and Palma Christii, skin grade castor oil.    Clinical Decision Making  Multiple treatment options, significant  modification of task necessary    Recommended Other Services  Fit with appropriate, knee length, BLE compression garments and or devices- For daytime consider custom, flat knit, open toe, Jobst Elvarex elastic classic stockings  And /Or adjustable, Velcro close, CircAid, short stretch leg and foot piece for ease of donning and doffing. Fit w/ HOS devices, ( consider  Jobst, ccl 2 Relax) to limit fibrosis formation and improve lymphatic transport during HOS.       Patient will benefit from skilled therapeutic intervention in order to improve the following deficits and impairments:  Decreased knowledge of use of DME, Decreased skin integrity, Increased edema, Decreased knowledge of precautions, Decreased safety awareness, Difficulty walking, Obesity, Decreased balance  Visit Diagnosis: Lymphedema, not elsewhere classified    Problem List Patient Active Problem List   Diagnosis Date Noted  . Lennox-Gastaut syndrome (HCC) 07/06/2017  . Acute decrease in level of consciousness 06/29/2017  . Lymphedema 01/22/2017  . HCAP (healthcare-associated pneumonia) 12/06/2016  . Arterial hypotension   . Sepsis (HCC) 11/04/2016  . Other constipation   . Bilateral lower extremity edema 04/21/2016  . Unsteadiness on feet   . Community acquired pneumonia of left lower lobe of lung (HCC) 04/05/2016  . Cough 04/03/2016  . Rhinitis, non-allergic 06/23/2015  . Special screening examination for human papillomavirus (HPV) 07/20/2014  . Amenorrhea 05/19/2014  . 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 Long-term use of high-risk medication 12/24/2012  . Morbid obesity (HCC) 12/24/2012  . 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 URINARY INCONTINENCE 02/25/2010  . Full incontinence of feces 02/25/2010  . 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 Hypothyroidism 01/29/2009  . 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 Infantile cerebral palsy (HCC) 01/29/2009   . GASTROESOPHAGEAL REFLUX DISEASE 01/29/2009  . Osteoporosis 01/29/2009  . Seizure disorder (HCC) 01/29/2009    Loel Dubonnet, MS, OTR/L, [facility] 07/30/17 11:33 AM  [place] [facility] MAIN [facility] SERVICES [address] [place], [place], [address] Phone: [phone]   Fax:  [phone]  Name: QUINLYNN CUTHBERT MRN: [id number] Date of Birth: 12/18/1985

## 2017-07-31 ENCOUNTER — Ambulatory Visit: Payer: Medicare Other | Admitting: Occupational Therapy

## 2017-07-31 DIAGNOSIS — I890 Lymphedema, not elsewhere classified: Secondary | ICD-10-CM

## 2017-07-31 NOTE — Therapy (Signed)
[facility] [address] [place], [place], [address] Phone: [phone]   Fax:  [phone]  Occupational Therapy Treatment  Patient Details  Name: Michele [name] MRN: [id number] Date of Birth: 02-08-1986 Referring Provider: Howard PouchLauren Feng, MD   Encounter Date: 07/31/2017  OT End of Session - 07/31/17 1148    Visit Number  3    Number of Visits  36    Date for OT Re-Evaluation  10/11/17    OT Start Time  0900    OT Stop Time  0955    OT Time Calculation (min)  55 min    Activity Tolerance  Patient tolerated treatment well;No increased pain;Other (comment) limited by impaired    mobility, ambulation , language limitations and significant intellectual delay    Behavior During Therapy  [facility]       Past Medical History:  Diagnosis Date  . Cerebral palsy (HCC)   . Hypothyroidism   . Lennox-Gastaut syndrome (HCC)   . Osteoporosis   . Pneumonia   . Profound mental retardation   . Reflux   . Rickets   . Seizures (HCC)     Past Surgical History:  Procedure Laterality Date  . FOOT MASS EXCISION Right   . Gastrocutaneous fistual repair  2012  . PEG TUBE PLACEMENT      There were no vitals filed for this visit.  Subjective Assessment - 07/31/17 1145    Subjective   Khalise presents for OT visit 3/ 36 for CDT to address BLE lymphedema. Pt is accommpanied by her mother today. Pt presents with LLE knee length compression wrap in place. Mother reports Pt had no difficulty tolerating wraps over the last 24 hours.    Patient is accompained by:  Family member    Pertinent History  CP, Hypothyroidism, Lennox-Gastaut Syndrome, Osteoporosis, hx pneumonia, profound MR, rickets, seizure discorder, R foot     Currently in Pain?  No/denies          LYMPHEDEMA/ONCOLOGY QUESTIONNAIRE - 07/30/17 1125      Lymphedema Assessments   Lymphedema Assessments  Lower extremities      Right Lower Extremity  Lymphedema   Other  RLE ankle-to popliteal (A-D) limb volume measures 2831.7 ml.      Left Lower Extremity Lymphedema   Other  LLE ankle-to popliteal (A-D) limb volume measures 2913.8 ml.    Other  Baseline Limb volume differential (LVD) measures 2,8%, L>R              OT Treatments/Exercises (OP) - 07/31/17 0001      ADLs   ADL Education Given  Yes      Manual Therapy   Manual Therapy  Edema management;Compression Bandaging    Edema Management  skin care to LLE w/ low ph Eucerin lotion prior ro applying compression bandages    Compression Bandaging  12 cm Rosidal foam applied over cotton stockinet to tibial tuberosity, overlapping equally and w/ only enopugh tensioon to ensure close contour without wronkles and bunching.. 10 and 12 cm compression wraps applied inm graduated layers using gradient techniques. Stretch net over all.             OT Education - 07/31/17 1147    Education provided  Yes    Education Details  Emphasis of visit LE self care for compression wrapping    Person(s) Educated  Parent(s)          OT Long Term Goals -  07/24/17 0859      OT LONG TERM GOAL #1   Title  Primary caregiver modified independent w/ lymphedema precautions and prevention principals and strategies using printed reference to limit LE progression and infection risk.    Baseline  Max A    Time  2    Period  Weeks    Status  New    Target Date  -- 10th OT visit for CDT      OT LONG TERM GOAL #2   Baseline  Max A    Time  2    Period  Weeks    Status  New    Target Date  -- 10th OT visit for CDT      OT LONG TERM GOAL #3   Baseline  Dependent     Time  12    Period  Weeks    Status  New    Target Date  10/11/17      OT LONG TERM GOAL #4   Time  12    Period  Weeks    Status  New    Target Date  10/11/17      OT LONG TERM GOAL #5   Baseline  dependent    Time  6    Period  Months    Status  New    Target Date  01/19/18            Plan - 07/31/17  1148    Clinical Impression Statement  Emphasis of visit on caregiver education for LE self care- compression wrap application using gradient techniques. Mother provided skilled instruction and multiple opportunities to practice. By end of session she was able to apply knee length wraps to LLE with mod A and verbal and physical cues. Cont as per POC.    Occupational Profile and client history currently impacting functional performance  intractible seizures, CP, intelectual delay, quadriparesis, dependent on others for all besic and instrumental ADLs    Occupational performance deficits (Please refer to evaluation for details):  ADL's;IADL's;Work;Play;Leisure;Social Participation;Rest and Sleep;Education    Rehab Potential  Good    Current Impairments/barriers affecting progress:  impaired intellectual delay and quadraparesis    OT Frequency  3x / week    OT Treatment/Interventions  Self-care/ADL training;DME and/or AE instruction;Manual Therapy;Patient/family education;Manual lymph drainage;Compression bandaging;Therapeutic activities;Other (comment) BLE skin care during MLD with low ph Eucerin lotion and Palma Christii, skin grade castor oil.    Clinical Decision Making  Multiple treatment options, significant modification of task necessary    Recommended Other Services  Fit with appropriate, knee length, BLE compression garments and or devices- For daytime consider custom, flat knit, open toe, Jobst Elvarex elastic classic stockings  And /Or adjustable, Velcro close, CircAid, short stretch leg and foot piece for ease of donning and doffing. Fit w/ HOS devices, ( consider Jobst, ccl 2 Relax) to limit fibrosis formation and improve lymphatic transport during HOS.       Patient will benefit from skilled therapeutic intervention in order to improve the following deficits and impairments:  Decreased knowledge of use of DME, Decreased skin integrity, Increased edema, Decreased knowledge of precautions,  Decreased safety awareness, Difficulty walking, Obesity, Decreased balance  Visit Diagnosis: Lymphedema, not elsewhere classified    Problem List Patient Active Problem List   Diagnosis Date Noted  . Lennox-Gastaut syndrome (HCC) 07/06/2017  . Acute decrease in level of consciousness 06/29/2017  . Lymphedema 01/22/2017  . HCAP (healthcare-associated pneumonia) 12/06/2016  . Arterial  hypotension   . Sepsis (HCC) 11/04/2016  . Other constipation   . Bilateral lower extremity edema 04/21/2016  . Unsteadiness on feet   . Community acquired pneumonia of left lower lobe of lung (HCC) 04/05/2016  . Cough 04/03/2016  . Rhinitis, non-allergic 06/23/2015  . Special screening examination for human papillomavirus (HPV) 07/20/2014  . Amenorrhea 05/19/2014  . 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 Long-term use of high-risk medication 12/24/2012  . Morbid obesity (HCC) 12/24/2012  . 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 URINARY INCONTINENCE 02/25/2010  . Full incontinence of feces 02/25/2010  . 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 Hypothyroidism 01/29/2009  . 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 Infantile cerebral palsy (HCC) 01/29/2009  . GASTROESOPHAGEAL REFLUX DISEASE 01/29/2009  . Osteoporosis 01/29/2009  . Seizure disorder (HCC) 01/29/2009   Loel Dubonnet, MS, OTR/L, [facility] 07/31/17 11:53 AM    [facility] MAIN [facility] SERVICES [address] [place], [place], [address] Phone: [phone]   Fax:  [phone]  Name: OTELIA HETTINGER MRN: [id number] Date of Birth: [birth year]

## 2017-08-01 ENCOUNTER — Ambulatory Visit: Payer: Medicare Other | Admitting: Occupational Therapy

## 2017-08-01 DIAGNOSIS — I890 Lymphedema, not elsewhere classified: Secondary | ICD-10-CM | POA: Diagnosis not present

## 2017-08-01 NOTE — Therapy (Signed)
[facility] MAIN [facility] SERVICES [address] [place], [place], [address] Phone: [phone]   Fax:  [phone]  Occupational Therapy Treatment  Patient Details  Name: Michele [name] MRN: [id number] Date of Birth: March 07, 1986 Referring Provider: Howard Pouch, MD   Encounter Date: 08/01/2017  OT End of Session - 08/01/17 1120    Visit Number  4    Number of Visits  36    Date for OT Re-Evaluation  10/11/17    OT Start Time  0903    OT Stop Time  0955    OT Time Calculation (min)  52 min    Activity Tolerance  Patient tolerated treatment well;No increased pain;Other (comment) limited by impaired    mobility, ambulation , language limitations and significant intellectual delay    Behavior During Therapy  [facility] for tasks assessed/performed       Past Medical History:  Diagnosis Date  . Cerebral palsy (HCC)   . Hypothyroidism   . Lennox-Gastaut syndrome (HCC)   . Osteoporosis   . Pneumonia   . Profound mental retardation   . Reflux   . Rickets   . Seizures (HCC)     Past Surgical History:  Procedure Laterality Date  . FOOT MASS EXCISION Right   . Gastrocutaneous fistual repair  2012  . PEG TUBE PLACEMENT      There were no vitals filed for this visit.  Subjective Assessment - 08/01/17 1115    Subjective   [name] presents for OT visit 4/ 36 for CDT to address BLE lymphedema. Pt is accommpanied by her mother today. Pt presents with LLE knee length compression wrap in place. Mother reports Pt had no difficulty tolerating wraps over the last 24 hours.    Patient is accompained by:  Family member    Pertinent History  CP, Hypothyroidism, Lennox-Gastaut Syndrome, Osteoporosis, hx pneumonia, profound MR, rickets, seizure discorder, R foot     Currently in Pain?  No/denies                   OT Treatments/Exercises (OP) - 08/01/17 0001      ADLs   ADL Education Given  Yes      Manual Therapy   Manual Therapy  Edema  management;Compression Bandaging;Manual Lymphatic Drainage (MLD)    Edema Management  skin care to LLE w/ low ph [facility] (low ph, skin grade castor oil) during MLD to hydrate skin and improve skin flexibility    Manual Lymphatic Drainage (MLD)  MLD to LLE utilizing short neck sequence, deep abdominal lymphatics, and functional inguinal and politeal LN.    Compression Bandaging  12 cm Rosidal foam applied over cotton stockinet to tibial tuberosity, overlapping equally and w/ only enopugh tensioon to ensure close contour without wronkles and bunching.. 10 and 12 cm compression wraps applied inm graduated layers using gradient techniques. Stretch net over all.             OT Education - 08/01/17 1118    Education provided  Yes    Education Details  Intro level CG edu for MLD, including rational , anatomy, and J stroke techniques. Cont skilled edu for compression wrapping. Mother required mod A w/ verbal and physical cues to apply knee length wrap using correct gradiwnt techniques.    Person(s) Educated  Parent(s)    Methods  Explanation;Demonstration;Tactile cues;Verbal cues;Handout    Comprehension  Verbalized understanding;Returned demonstration;Verbal cues required;Tactile cues required;Need further instruction  OT Long Term Goals - 07/24/17 0859      OT LONG TERM GOAL #1   Title  Primary caregiver modified independent w/ lymphedema precautions and prevention principals and strategies using printed reference to limit LE progression and infection risk.    Baseline  Max A    Time  2    Period  Weeks    Status  New    Target Date  -- 10th OT visit for CDT      OT LONG TERM GOAL #2   Baseline  Max A    Time  2    Period  Weeks    Status  New    Target Date  -- 10th OT visit for CDT      OT LONG TERM GOAL #3   Baseline  Dependent     Time  12    Period  Weeks    Status  New    Target Date  10/11/17      OT LONG TERM GOAL #4   Time  12    Period  Weeks     Status  New    Target Date  10/11/17      OT LONG TERM GOAL #5   Baseline  dependent    Time  6    Period  Months    Status  New    Target Date  01/19/18            Plan - 08/01/17 1120    Clinical Impression Statement  Continued skilled CG edu for compression wrapping and MLD. Commenced LLE MLD todaay. Pt had no difficulty tolerating. Limb volume of LLE continues to decrease by visual assessmentm and Pt accepts wraps without problem. Mother is diligent caregiver and  actively participates in all aspects of OT session. She'll continue to practice compression wrap techniques during visit interval. Cont as per POC.    Occupational Profile and client history currently impacting functional performance  intractible seizures, CP, intelectual delay, quadriparesis, dependent on others for all besic and instrumental ADLs    Occupational performance deficits (Please refer to evaluation for details):  ADL's;IADL's;Work;Play;Leisure;Social Participation;Rest and Sleep;Education    Rehab Potential  Good    Current Impairments/barriers affecting progress:  impaired intellectual delay and quadraparesis    OT Frequency  3x / week    OT Treatment/Interventions  Self-care/ADL training;DME and/or AE instruction;Manual Therapy;Patient/family education;Manual lymph drainage;Compression bandaging;Therapeutic activities;Other (comment) BLE skin care during MLD with low ph Eucerin lotion and Palma Christii, skin grade castor oil.    Clinical Decision Making  Multiple treatment options, significant modification of task necessary    Recommended Other Services  Fit with appropriate, knee length, BLE compression garments and or devices- For daytime consider custom, flat knit, open toe, Jobst Elvarex elastic classic stockings  And /Or adjustable, Velcro close, CircAid, short stretch leg and foot piece for ease of donning and doffing. Fit w/ HOS devices, ( consider Jobst, ccl 2 Relax) to limit fibrosis formation and  improve lymphatic transport during HOS.       Patient will benefit from skilled therapeutic intervention in order to improve the following deficits and impairments:  Decreased knowledge of use of DME, Decreased skin integrity, Increased edema, Decreased knowledge of precautions, Decreased safety awareness, Difficulty walking, Obesity, Decreased balance  Visit Diagnosis: Lymphedema, not elsewhere classified    Problem List Patient Active Problem List   Diagnosis Date Noted  . Lennox-Gastaut syndrome (HCC) 07/06/2017  . Acute decrease in level  of consciousness 06/29/2017  . Lymphedema 01/22/2017  . HCAP (healthcare-associated pneumonia) 12/06/2016  . Arterial hypotension   . Sepsis (HCC) 11/04/2016  . Other constipation   . Bilateral lower extremity edema 04/21/2016  . Unsteadiness on feet   . Community acquired pneumonia of left lower lobe of lung (HCC) 04/05/2016  . Cough 04/03/2016  . Rhinitis, non-allergic 06/23/2015  . Special screening examination for human papillomavirus (HPV) 07/20/2014  . Amenorrhea 05/19/2014  . 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 Long-term use of high-risk medication 12/24/2012  . Morbid obesity (HCC) 12/24/2012  . 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 URINARY INCONTINENCE 02/25/2010  . Full incontinence of feces 02/25/2010  . 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 Hypothyroidism 01/29/2009  . 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 Infantile cerebral palsy (HCC) 01/29/2009  . GASTROESOPHAGEAL REFLUX DISEASE 01/29/2009  . Osteoporosis 01/29/2009  . Seizure disorder (HCC) 01/29/2009    Loel Dubonnetheresa Shahid Flori, MS, OTR/L, [facility] 08/01/17 11:25 AM   [facility] [address] [place], [place], [address] Phone: [phone]   Fax:  [phone]  Name: Michele PineRobin C [name] MRN: [id number] Date of Birth: 1985-11-24

## 2017-08-02 ENCOUNTER — Telehealth (INDEPENDENT_AMBULATORY_CARE_PROVIDER_SITE_OTHER): Payer: Self-pay | Admitting: Family

## 2017-08-02 NOTE — Telephone Encounter (Signed)
PA has been approved and the pharmacy has been notified 

## 2017-08-02 NOTE — Telephone Encounter (Signed)
Yes ma'am this is it and no it was suspended

## 2017-08-02 NOTE — Telephone Encounter (Signed)
Who's calling (name and relationship to patient) : Michele [name] (mom) Best contact number: [phone] Provider they see: Blane OharaGoodpasture Reason for call: Mom left voice message that the pharmacy sent over a PA form for patient's medication.  Please call.       PRESCRIPTION REFILL ONLY  Name of prescription:  Pharmacy:

## 2017-08-02 NOTE — Telephone Encounter (Signed)
PA has been approved and the pharmacy has been notified

## 2017-08-06 ENCOUNTER — Ambulatory Visit: Payer: Medicare Other | Admitting: Occupational Therapy

## 2017-08-06 DIAGNOSIS — I890 Lymphedema, not elsewhere classified: Secondary | ICD-10-CM

## 2017-08-06 NOTE — Therapy (Signed)
[place] MAIN [facility] SERVICES [address] [place], [place], [address] Phone: [phone]   Fax:  [phone]  Occupational Therapy Treatment  Patient Details  Name: Michele [name] MRN: [id number] Date of Birth: 1985-09-23 Referring Provider: Everrett Coombe, MD   Encounter Date: 08/06/2017  OT End of Session - 08/06/17 1618    Visit Number  5    Number of Visits  36    Date for OT Re-Evaluation  10/11/17    OT Start Time  0900    OT Stop Time  0958    OT Time Calculation (min)  58 min    Activity Tolerance  Patient tolerated treatment well;No increased pain;Other (comment) limited by impaired    mobility, ambulation , language limitations and significant intellectual delay    Behavior During Therapy  [facility] for tasks assessed/performed       Past Medical History:  Diagnosis Date  . Cerebral palsy ([place])   . Hypothyroidism   . Lennox-Gastaut syndrome ([place])   . Osteoporosis   . Pneumonia   . Profound mental retardation   . Reflux   . Rickets   . Seizures ([place])     Past Surgical History:  Procedure Laterality Date  . FOOT MASS EXCISION Right   . Gastrocutaneous fistual repair  2012  . PEG TUBE PLACEMENT      There were no vitals filed for this visit.  Subjective Assessment - 08/06/17 0906    Subjective   Michele [name] presents for OT visit 5/ 36 for CDT to address BLE lymphedema. Pt is accommpanied by her mother today. Pt presents with LLE knee length compression wrap in place. Mother reports Pt had no difficulty tolerating wraps over the weekend. "I taught my daughter how to wrap and she did good."    Patient is accompained by:  Family member    Pertinent History  CP, Hypothyroidism, Lennox-Gastaut Syndrome, Osteoporosis, hx pneumonia, profound MR, rickets, seizure discorder, R foot     Currently in Pain?  No/denies          LYMPHEDEMA/ONCOLOGY QUESTIONNAIRE - 08/06/17 1000      Left Lower Extremity Lymphedema   Other  LLE ankle-to  popliteal (A-D) limb volume measures 2626.54 ml.    Other  LLE is decreased by 9.86% below the knee since initially measured on 2/11/20019              OT Treatments/Exercises (OP) - 08/06/17 0001      ADLs   ADL Education Given  Yes      Manual Therapy   Manual Therapy  Edema management;Compression Bandaging;Manual Lymphatic Drainage (MLD)    Manual therapy comments  lle COMPARATIVE LIMV VOLUMETRICS    Edema Management  skin care to LLE w/ low ph [facility] (low ph, skin grade castor oil) during MLD to hydrate skin and improve skin flexibility    Manual Lymphatic Drainage (MLD)  MLD to LLE utilizing short neck sequence, deep abdominal lymphatics, and functional inguinal and politeal LN.    Compression Bandaging  12 cm Rosidal foam applied over cotton stockinet to tibial tuberosity, overlapping equally and w/ only enopugh tensioon to ensure close contour without wronkles and bunching.. 10 and 12 cm compression wraps applied inm graduated layers using gradient techniques. Stretch net over all.             OT Education - 08/06/17 1617    Education provided  Yes    Education Details  Continued skilled Pt/caregiver  education  And LE ADL training throughout visit for lymphedema self care/ home program, including compression wrapping, compression garment and device wear/care, lymphatic pumping ther ex, simple self-MLD, and skin care. Discussed progress towards goals.    Person(s) Educated  Patient    Methods  Explanation;Demonstration;Tactile cues;Verbal cues;Handout    Comprehension  Verbalized understanding;Returned demonstration;Verbal cues required;Tactile cues required          OT Long Term Goals - 08/06/17 1621      OT LONG TERM GOAL #1   Title  Pt to follow Lymphedema precautions and prevention strategies with maximum caregiver assistance to limit LE progression and further functional decline. (Primary caregiver modified independent w/ lymphedema precautions and  prevention principals and strategies using printed reference to limit LE progression and infection risk.)    Baseline  Max A Goal Met 08/06/17    Time  2    Period  Weeks    Status  Achieved      OT LONG TERM GOAL #2   Title  Lymphedema (LE) management/ self-care: Pt able to apply knee length, multi layered, gradient compression wraps, one leg at a time, with max caregiver assist x 1 using proper techniques within 2 weeks to achieve optimal limb volume reduction in preparation for fitting compression garments/ devices.    Baseline  Dependent Goal Met 08/06/17    Time  2    Period  Weeks    Status  Achieved      OT LONG TERM GOAL #3   Title  Lymphedema (LE) management/ self-care:  Pt to achieve at least 10%  BLE limb volume reductions  during Intensive Phase CDT to limit LE progression, to reduce infection risk, and to improve functional mobility and transfers essential for optimal ADLs performance.    Baseline  Dependent  Nearly met w/ 9.86% limb volume reduction measured 08/06/17    Time  12    Period  Weeks    Status  Partially Met      OT LONG TERM GOAL #4   Title  Lymphedema (LE) management/ self-care:  Pt to tolerate daily compression wraps, compression garments and/ or HOS devices in keeping w/ prescribed wear regimes within 1 week of issue date of each to progress and retain clinical and functional gains and to limit LE progression.    Baseline  Max A    Time  12    Period  Weeks    Status  New      OT LONG TERM GOAL #5   Title  Lymphedema (LE) management/ self-care:  During Management Phase CDT Pt to sustain current limb volumes within 3%, and all other clinical gains achieved during OT treatment iwith maximum caregiver assistance  (to control limb swelling and associated pain/ discomfort, to  limit LE progression, to decrease infection risk and to limit further functional decline.    Baseline  dependent    Time  6    Period  Months    Status  New            Plan -  08/06/17 1615    Clinical Impression Statement  Completed LLE comparative limb volumetrics. LLE is decreased by 9.86% below the knee since initially measured on 2/11/20019. This value is closely approaching 10% reduction goal range. Provided MLD, skin care and compression wraps and Pt tolerated all without difficulty. Caregiver is independent with compression wraps based on wrap application Pt arrived in todaay. Goal met. Cont as per POC.  Occupational Profile and client history currently impacting functional performance  intractible seizures, CP, intelectual delay, quadriparesis, dependent on others for all besic and instrumental ADLs    Occupational performance deficits (Please refer to evaluation for details):  ADL's;IADL's;Work;Play;Leisure;Social Participation;Rest and Sleep;Education    Rehab Potential  Good    Current Impairments/barriers affecting progress:  impaired intellectual delay and quadraparesis    OT Frequency  3x / week    OT Treatment/Interventions  Self-care/ADL training;DME and/or AE instruction;Manual Therapy;Patient/family education;Manual lymph drainage;Compression bandaging;Therapeutic activities;Other (comment) BLE skin care during MLD with low ph Eucerin lotion and Palma Christii, skin grade castor oil.    Clinical Decision Making  Multiple treatment options, significant modification of task necessary    Recommended Other Services  Fit with appropriate, knee length, BLE compression garments and or devices- For daytime consider custom, flat knit, open toe, Jobst Elvarex elastic classic stockings  And /Or adjustable, Velcro close, CircAid, short stretch leg and foot piece for ease of donning and doffing. Fit w/ HOS devices, ( consider Jobst, ccl 2 Relax) to limit fibrosis formation and improve lymphatic transport during HOS.       Patient will benefit from skilled therapeutic intervention in order to improve the following deficits and impairments:  Decreased knowledge of use  of DME, Decreased skin integrity, Increased edema, Decreased knowledge of precautions, Decreased safety awareness, Difficulty walking, Obesity, Decreased balance  Visit Diagnosis: Lymphedema, not elsewhere classified    Problem List Patient Active Problem List   Diagnosis Date Noted  . Lennox-Gastaut syndrome ([place]) 07/06/2017  . Acute decrease in level of consciousness 06/29/2017  . Lymphedema 01/22/2017  . HCAP (healthcare-associated pneumonia) 12/06/2016  . Arterial hypotension   . Sepsis ([place]) 11/04/2016  . Other constipation   . Bilateral lower extremity edema 04/21/2016  . Unsteadiness on feet   . Community acquired pneumonia of left lower lobe of lung ([place]) 04/05/2016  . Cough 04/03/2016  . Rhinitis, non-allergic 06/23/2015  . Special screening examination for human papillomavirus (HPV) 07/20/2014  . Amenorrhea 05/19/2014  . 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 Long-term use of high-risk medication 12/24/2012  . Morbid obesity ([place]) 12/24/2012  . Congenital quadriplegia ([place]) 12/24/2012  . URINARY INCONTINENCE 02/25/2010  . Full incontinence of feces 02/25/2010  . DIGESTIVE SYSTEM COMPLICATION NEC 17/71/1657  . Hypothyroidism 01/29/2009  . 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 Infantile cerebral palsy ([place]) 01/29/2009  . GASTROESOPHAGEAL REFLUX DISEASE 01/29/2009  . Osteoporosis 01/29/2009  . Seizure disorder ([place]) 01/29/2009    Andrey Spearman, MS, OTR/L, [facility] 08/06/17 4:25 PM  [place] MAIN [facility] SERVICES [address] [place], [place], [address] Phone: [phone]   Fax:  [phone]  Name: ASHAWNTI TANGEN MRN: [id number] Date of Birth: 11-09-1985

## 2017-08-07 ENCOUNTER — Telehealth: Payer: Self-pay | Admitting: Student in an Organized Health Care Education/Training Program

## 2017-08-07 NOTE — Telephone Encounter (Signed)
Handicap form dropped off for at front desk for completion.  Verified that patient section of form has been completed.  Last DOS/WCC with PCP was 06/29/17.  Placed form in team folder to be completed by clinical staff.  Chari ManningLynette D Sells

## 2017-08-07 NOTE — Telephone Encounter (Signed)
Clinical info completed on Disability Placard form.  Place form in Dr. Latanya MaudlinFeng's box for completion.  Lamonte SakaiZimmerman Rumple, Ulys Favia D, [place]

## 2017-08-08 ENCOUNTER — Ambulatory Visit: Payer: Medicare Other | Admitting: Occupational Therapy

## 2017-08-09 NOTE — Telephone Encounter (Signed)
Form completed and placed in RN box

## 2017-08-10 NOTE — Telephone Encounter (Signed)
Patient mother made aware that form is available for pick up at front desk. Ples SpecterAlisa Augustino Savastano, RN [facility])

## 2017-08-11 IMAGING — DX DG CHEST 1V PORT
1 series · 1 of 1 positions shown · non-contrast
Comparison: Prior radiograph from 08/15/2016.

CLINICAL DATA: Initial evaluation for acute cough. History of
pneumonia.

EXAM:
PORTABLE CHEST 1 VIEW

[chest ap]
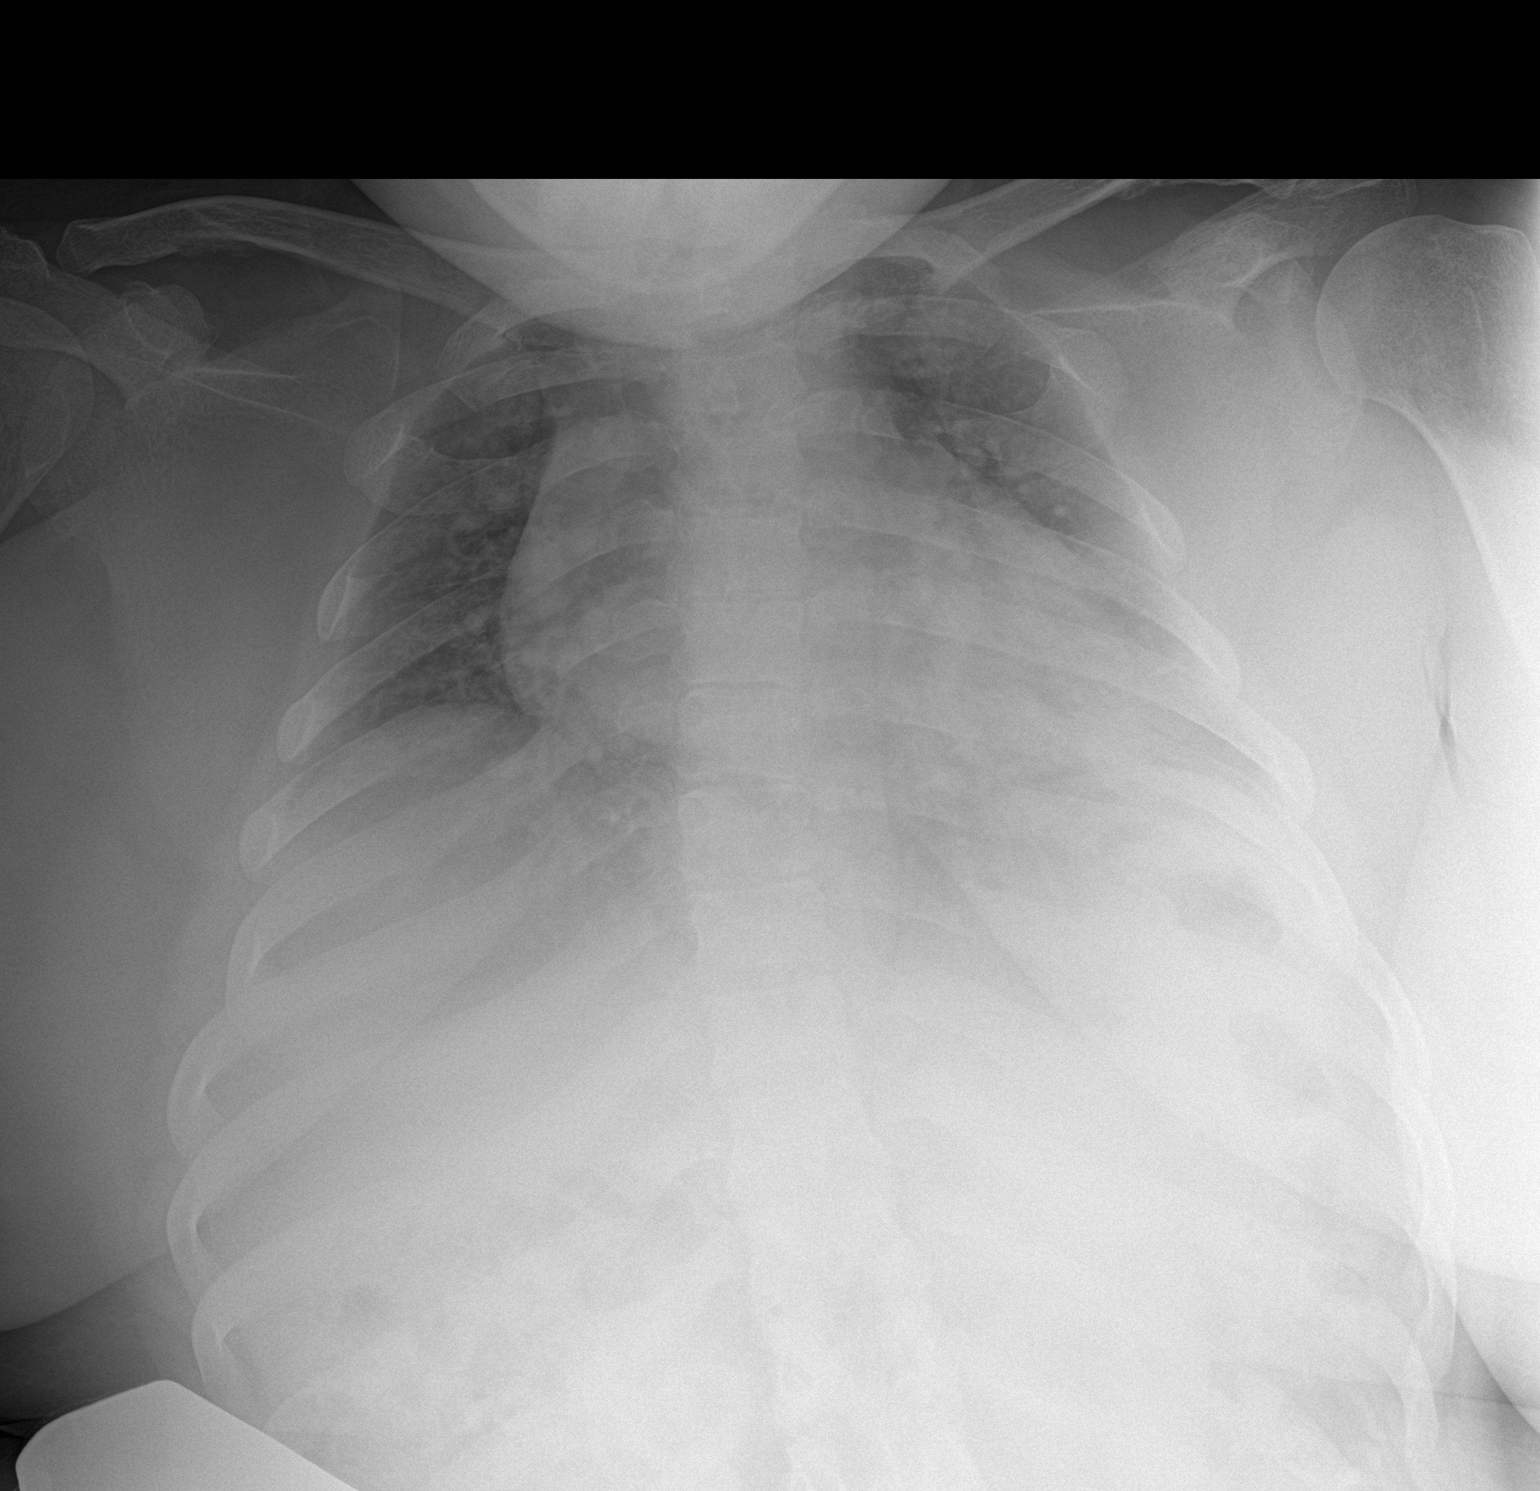

[1 of 1 positions shown; findings below may reference images not displayed]

FINDINGS: Stable cardiomegaly.  Mediastinal silhouette unchanged.

Lungs hypoinflated. Mild diffuse vascular congestion without frank
pulmonary edema. Patchy opacity overlies the retrocardiac left lower
lobe, which may reflect atelectasis or infiltrate. No other definite
focal airspace disease. No obvious pleural effusion. No
pneumothorax.

No acute osseus abnormality.
IMPRESSION: 1. Patchy opacity within the retrocardiac left lower lobe, which may
reflect atelectasis or infiltrate.
2. Cardiomegaly with mild diffuse pulmonary vascular congestion
without frank pulmonary edema.

## 2017-08-13 ENCOUNTER — Ambulatory Visit: Payer: Medicare Other | Admitting: Occupational Therapy

## 2017-08-13 DIAGNOSIS — I890 Lymphedema, not elsewhere classified: Secondary | ICD-10-CM

## 2017-08-13 NOTE — Therapy (Signed)
[place] MAIN [facility] SERVICES [address] [place], [place], [address] Phone: [phone]   Fax:  [phone]  Occupational Therapy Treatment Note and Progress Report  Patient Details  Name: Michele [name] MRN: [id number] Date of Birth: 02/24/1986 Referring Provider: Everrett Coombe, MD   Encounter Date: 08/13/2017  OT End of Session - 08/13/17 1019    Visit Number  6    Number of Visits  36    Date for OT Re-Evaluation  10/11/17    OT Start Time  0900    OT Stop Time  1005    OT Time Calculation (min)  65 min    Activity Tolerance  Patient tolerated treatment well;No increased pain;Other (comment) limited by impaired    mobility, ambulation , language limitations and significant intellectual delay    Behavior During Therapy  [facility] for tasks assessed/performed       Past Medical History:  Diagnosis Date  . Cerebral palsy ([place])   . Hypothyroidism   . Lennox-Gastaut syndrome ([place])   . Osteoporosis   . Pneumonia   . Profound mental retardation   . Reflux   . Rickets   . Seizures ([place])     Past Surgical History:  Procedure Laterality Date  . FOOT MASS EXCISION Right   . Gastrocutaneous fistual repair  2012  . PEG TUBE PLACEMENT      There were no vitals filed for this visit.  Subjective Assessment - 08/13/17 0910    Subjective   Michele [name] presents for OT visit 6/ 36 for CDT to address BLE lymphedema. Pt is accommpanied by her mother today. Pt presents with LLE knee length compression wrap in place. Mother reports Pt had no difficulty tolerating wraps over the weekend.     Patient is accompained by:  Family member    Pertinent History  CP, Hypothyroidism, Lennox-Gastaut Syndrome, Osteoporosis, hx pneumonia, profound MR, rickets, seizure discorder, R foot     Currently in Pain?  No/denies          LYMPHEDEMA/ONCOLOGY QUESTIONNAIRE - 08/13/17 1020      Left Lower Extremity Lymphedema   Other  LLE ankle-to popliteal (A-D) limb volume  measures 2578.84 ml.    Other  LLE is decreased an additional 1.82% below the knee since last measured on 2/18, and by a total of 11.50% since  initially measured on 2/11/20019 LLE 10% volume reduction goal met!              OT Treatments/Exercises (OP) - 08/13/17 0001      ADLs   ADL Education Given  Yes      Manual Therapy   Manual Therapy  Edema management;Compression Bandaging;Manual Lymphatic Drainage (MLD)    Manual therapy comments  lle COMPARATIVE LIMV VOLUMETRICS    Edema Management  skin care to LLE w/ low ph [facility] (low ph, skin grade castor oil) during MLD to hydrate skin and improve skin flexibility    Manual Lymphatic Drainage (MLD)  MLD to LLE utilizing short neck sequence, deep abdominal lymphatics, and functional inguinal and politeal LN.    Compression Bandaging  12 cm Rosidal foam applied over cotton stockinet to tibial tuberosity, overlapping equally and w/ only enopugh tensioon to ensure close contour without wronkles and bunching.. 10 and 12 cm compression wraps applied inm graduated layers using gradient techniques. Stretch net over all.             OT Education - 08/13/17 1018  Education provided  Yes    Education Details  Continued skilled Pt/caregiver education  And LE ADL training throughout visit for lymphedema self care/ home program, including compression wrapping, compression garment and device wear/care, lymphatic pumping ther ex, simple self-MLD, and skin care. Discussed progress towards goals.    Person(s) Educated  Patient;Parent(s)    Methods  Explanation;Demonstration    Comprehension  Verbalized understanding;Returned demonstration          OT Long Term Goals - 08/13/17 1025      OT LONG TERM GOAL #1   Title  Pt to follow Lymphedema precautions and prevention strategies with maximum caregiver assistance to limit LE progression and further functional decline. (Primary caregiver modified independent w/ lymphedema precautions  and prevention principals and strategies using printed reference to limit LE progression and infection risk.)    Baseline  Max A Goal Met 08/06/17    Time  2    Period  Weeks    Status  Achieved      OT LONG TERM GOAL #2   Title  Lymphedema (LE) management/ self-care: Pt able to apply knee length, multi layered, gradient compression wraps, one leg at a time, with max caregiver assist x 1 using proper techniques within 2 weeks to achieve optimal limb volume reduction in preparation for fitting compression garments/ devices.    Baseline  Dependent Goal Met 08/06/17    Time  2    Period  Weeks    Status  Achieved      OT LONG TERM GOAL #3   Title  Lymphedema (LE) management/ self-care:  Pt to achieve at least 10%  BLE limb volume reductions  during Intensive Phase CDT to limit LE progression, to reduce infection risk, and to improve functional mobility and transfers essential for optimal ADLs performance.    Baseline  Dependent  Nearly met w/ 9.86% limb volume reduction measured 08/06/17 LLE GOAL MET 08/13/17 w/ 11.5% reduction.    Time  12    Period  Weeks    Status  Partially Met      OT LONG TERM GOAL #4   Title  Lymphedema (LE) management/ self-care:  Pt to tolerate daily compression wraps, compression garments and/ or HOS devices in keeping w/ prescribed wear regimes within 1 week of issue date of each to progress and retain clinical and functional gains and to limit LE progression.    Baseline  Max A    Time  12    Period  Weeks    Status  New      OT LONG TERM GOAL #5   Title  Lymphedema (LE) management/ self-care:  During Management Phase CDT Pt to sustain current limb volumes within 3%, and all other clinical gains achieved during OT treatment iwith maximum caregiver assistance  (to control limb swelling and associated pain/ discomfort, to  limit LE progression, to decrease infection risk and to limit further functional decline.    Baseline  dependent    Time  6    Period  Months     Status  New            Plan - 08/13/17 1022    Clinical Impression Statement  Compled LLE comparative limb voumetrics again today in effort to assess progress towards goals and timing for compression garment measurements. LLE is decreased an additional 1.82% below the knee since last measured on 2/18, and by a total of 11.50% since  initially measured on 2/11/20019 LLE 10% volume  reduction GOAL MET. Provided MLD to LLE and applied gradient compression wraps. Mother agrees with plan to measure LLE again at Ercelle's last OT visit this week. . Cont as per POC.    Occupational Profile and client history currently impacting functional performance  intractible seizures, CP, intelectual delay, quadriparesis, dependent on others for all besic and instrumental ADLs    Occupational performance deficits (Please refer to evaluation for details):  ADL's;IADL's;Work;Play;Leisure;Social Participation;Rest and Sleep;Education    Rehab Potential  Good    Current Impairments/barriers affecting progress:  impaired intellectual delay and quadraparesis    OT Frequency  3x / week    OT Treatment/Interventions  Self-care/ADL training;DME and/or AE instruction;Manual Therapy;Patient/family education;Manual lymph drainage;Compression bandaging;Therapeutic activities;Other (comment) BLE skin care during MLD with low ph Eucerin lotion and Palma Christii, skin grade castor oil.    Clinical Decision Making  Multiple treatment options, significant modification of task necessary    Recommended Other Services  Fit with appropriate, knee length, BLE compression garments and or devices- For daytime consider custom, flat knit, open toe, Jobst Elvarex elastic classic stockings  And /Or adjustable, Velcro close, CircAid, short stretch leg and foot piece for ease of donning and doffing. Fit w/ HOS devices, ( consider Jobst, ccl 2 Relax) to limit fibrosis formation and improve lymphatic transport during HOS.       Patient will  benefit from skilled therapeutic intervention in order to improve the following deficits and impairments:  Decreased knowledge of use of DME, Decreased skin integrity, Increased edema, Decreased knowledge of precautions, Decreased safety awareness, Difficulty walking, Obesity, Decreased balance  Visit Diagnosis: Lymphedema, not elsewhere classified    Problem List Patient Active Problem List   Diagnosis Date Noted  . Lennox-Gastaut syndrome ([place]) 07/06/2017  . Acute decrease in level of consciousness 06/29/2017  . Lymphedema 01/22/2017  . HCAP (healthcare-associated pneumonia) 12/06/2016  . Arterial hypotension   . Sepsis ([place]) 11/04/2016  . Other constipation   . Bilateral lower extremity edema 04/21/2016  . Unsteadiness on feet   . Community acquired pneumonia of left lower lobe of lung ([place]) 04/05/2016  . Cough 04/03/2016  . Rhinitis, non-allergic 06/23/2015  . Special screening examination for human papillomavirus (HPV) 07/20/2014  . Amenorrhea 05/19/2014  . 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 Long-term use of high-risk medication 12/24/2012  . Morbid obesity ([place]) 12/24/2012  . Congenital quadriplegia ([place]) 12/24/2012  . URINARY INCONTINENCE 02/25/2010  . Full incontinence of feces 02/25/2010  . DIGESTIVE SYSTEM COMPLICATION NEC 03/49/1791  . Hypothyroidism 01/29/2009  . 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 Infantile cerebral palsy ([place]) 01/29/2009  . GASTROESOPHAGEAL REFLUX DISEASE 01/29/2009  . Osteoporosis 01/29/2009  . Seizure disorder ([place]) 01/29/2009    Andrey Spearman, MS, OTR/L, [facility] 08/13/17 10:27 AM   [place] MAIN [facility] SERVICES [address] [place], [place], [address] Phone: [phone]   Fax:  [phone]  Name: Michele [name] MRN: [id number] Date of Birth: 22-Oct-1985

## 2017-08-14 ENCOUNTER — Ambulatory Visit: Payer: Medicare Other | Admitting: Occupational Therapy

## 2017-08-14 DIAGNOSIS — I890 Lymphedema, not elsewhere classified: Secondary | ICD-10-CM

## 2017-08-15 ENCOUNTER — Ambulatory Visit: Payer: Medicare Other | Admitting: Occupational Therapy

## 2017-08-15 DIAGNOSIS — I890 Lymphedema, not elsewhere classified: Secondary | ICD-10-CM

## 2017-08-15 NOTE — Therapy (Signed)
[place] MAIN [facility] SERVICES [address] [place], [place], [address] Phone: [phone]   Fax:  [phone]  Occupational Therapy Treatment  Patient Details  Name: Michele [name] MRN: [id number] Date of Birth: 29-Apr-1986 Referring Provider: Everrett Coombe, MD   Encounter Date: 08/14/2017  OT End of Session - 08/14/17 0945    Visit Number  7    Number of Visits  36    Date for OT Re-Evaluation  10/11/17    OT Start Time  0910    OT Stop Time  1000    OT Time Calculation (min)  50 min    Activity Tolerance  Patient tolerated treatment well;No increased pain;Other (comment) limited by impaired    mobility, ambulation , language limitations and significant intellectual delay    Behavior During Therapy  [facility] for tasks assessed/performed       Past Medical History:  Diagnosis Date  . Cerebral palsy ([place])   . Hypothyroidism   . Lennox-Gastaut syndrome ([place])   . Osteoporosis   . Pneumonia   . Profound mental retardation   . Reflux   . Rickets   . Seizures ([place])     Past Surgical History:  Procedure Laterality Date  . FOOT MASS EXCISION Right   . Gastrocutaneous fistual repair  2012  . PEG TUBE PLACEMENT      There were no vitals filed for this visit.  Subjective Assessment - 08/14/17 0945    Subjective   [name] presents for OT visit 7/ 36 for CDT to address BLE lymphedema. Pt is accommpanied by her mother today. Pt presents with LLE knee length compression wrap in place. Pt and mother have no new complaints or concerns.    Patient is accompained by:  Family member    Pertinent History  CP, Hypothyroidism, Lennox-Gastaut Syndrome, Osteoporosis, hx pneumonia, profound MR, rickets, seizure discorder, R foot     Currently in Pain?  No/denies                   OT Treatments/Exercises (OP) - 08/15/17 0001      ADLs   ADL Education Given  Yes      Manual Therapy   Manual Therapy  Edema management;Compression Bandaging;Manual  Lymphatic Drainage (MLD)    Edema Management  skin care to LLE w/ low ph [facility] (low ph, skin grade castor oil) during MLD to hydrate skin and improve skin flexibility    Manual Lymphatic Drainage (MLD)  MLD to LLE utilizing short neck sequence, deep abdominal lymphatics, and functional inguinal and politeal LN.    Compression Bandaging  12 cm Rosidal foam applied over cotton stockinet to tibial tuberosity, overlapping equally and w/ only enopugh tensioon to ensure close contour without wronkles and bunching.. 10 and 12 cm compression wraps applied inm graduated layers using gradient techniques. Stretch net over all.             OT Education - 08/14/17 0946    Education provided  Yes    Education Details  Continued skilled Pt/caregiver education  And LE ADL training throughout visit for lymphedema self care/ home program, including compression wrapping, compression garment and device wear/care, lymphatic pumping ther ex, simple self-MLD, and skin care. Discussed progress towards goals.    Person(s) Educated  Patient;Parent(s)    Methods  Explanation;Demonstration    Comprehension  Verbalized understanding;Returned demonstration          OT Long Term Goals - 08/13/17 1025  OT LONG TERM GOAL #1   Title  Pt to follow Lymphedema precautions and prevention strategies with maximum caregiver assistance to limit LE progression and further functional decline. (Primary caregiver modified independent w/ lymphedema precautions and prevention principals and strategies using printed reference to limit LE progression and infection risk.)    Baseline  Max A Goal Met 08/06/17    Time  2    Period  Weeks    Status  Achieved      OT LONG TERM GOAL #2   Title  Lymphedema (LE) management/ self-care: Pt able to apply knee length, multi layered, gradient compression wraps, one leg at a time, with max caregiver assist x 1 using proper techniques within 2 weeks to achieve optimal limb volume  reduction in preparation for fitting compression garments/ devices.    Baseline  Dependent Goal Met 08/06/17    Time  2    Period  Weeks    Status  Achieved      OT LONG TERM GOAL #3   Title  Lymphedema (LE) management/ self-care:  Pt to achieve at least 10%  BLE limb volume reductions  during Intensive Phase CDT to limit LE progression, to reduce infection risk, and to improve functional mobility and transfers essential for optimal ADLs performance.    Baseline  Dependent  Nearly met w/ 9.86% limb volume reduction measured 08/06/17 LLE GOAL MET 08/13/17 w/ 11.5% reduction.    Time  12    Period  Weeks    Status  Partially Met      OT LONG TERM GOAL #4   Title  Lymphedema (LE) management/ self-care:  Pt to tolerate daily compression wraps, compression garments and/ or HOS devices in keeping w/ prescribed wear regimes within 1 week of issue date of each to progress and retain clinical and functional gains and to limit LE progression.    Baseline  Max A    Time  12    Period  Weeks    Status  New      OT LONG TERM GOAL #5   Title  Lymphedema (LE) management/ self-care:  During Management Phase CDT Pt to sustain current limb volumes within 3%, and all other clinical gains achieved during OT treatment iwith maximum caregiver assistance  (to control limb swelling and associated pain/ discomfort, to  limit LE progression, to decrease infection risk and to limit further functional decline.    Baseline  dependent    Time  6    Period  Months    Status  New            Plan - 08/15/17 0350    Clinical Impression Statement  Provided MLD, skin care and compression therapy according to protocol. Cont Parent/ CG training for LE self care home program throughout session. Pt xcontinues to demonstrate progress towards all goals. CG is diligent with home program . Cont as per POC.    Occupational Profile and client history currently impacting functional performance  intractible seizures, CP,  intelectual delay, quadriparesis, dependent on others for all besic and instrumental ADLs    Occupational performance deficits (Please refer to evaluation for details):  ADL's;IADL's;Work;Play;Leisure;Social Participation;Rest and Sleep;Education    Rehab Potential  Good    Current Impairments/barriers affecting progress:  impaired intellectual delay and quadraparesis    OT Frequency  3x / week    OT Treatment/Interventions  Self-care/ADL training;DME and/or AE instruction;Manual Therapy;Patient/family education;Manual lymph drainage;Compression bandaging;Therapeutic activities;Other (comment) BLE skin care during MLD with  low ph Eucerin lotion and Palma Christii, skin grade castor oil.    Clinical Decision Making  Multiple treatment options, significant modification of task necessary    Recommended Other Services  Fit with appropriate, knee length, BLE compression garments and or devices- For daytime consider custom, flat knit, open toe, Jobst Elvarex elastic classic stockings  And /Or adjustable, Velcro close, CircAid, short stretch leg and foot piece for ease of donning and doffing. Fit w/ HOS devices, ( consider Jobst, ccl 2 Relax) to limit fibrosis formation and improve lymphatic transport during HOS.       Patient will benefit from skilled therapeutic intervention in order to improve the following deficits and impairments:  Decreased knowledge of use of DME, Decreased skin integrity, Increased edema, Decreased knowledge of precautions, Decreased safety awareness, Difficulty walking, Obesity, Decreased balance  Visit Diagnosis: Lymphedema, not elsewhere classified    Problem List Patient Active Problem List   Diagnosis Date Noted  . Lennox-Gastaut syndrome ([place]) 07/06/2017  . Acute decrease in level of consciousness 06/29/2017  . Lymphedema 01/22/2017  . HCAP (healthcare-associated pneumonia) 12/06/2016  . Arterial hypotension   . Sepsis ([place]) 11/04/2016  . Other constipation   .  Bilateral lower extremity edema 04/21/2016  . Unsteadiness on feet   . Community acquired pneumonia of left lower lobe of lung ([place]) 04/05/2016  . Cough 04/03/2016  . Rhinitis, non-allergic 06/23/2015  . Special screening examination for human papillomavirus (HPV) 07/20/2014  . Amenorrhea 05/19/2014  . 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 Long-term use of high-risk medication 12/24/2012  . Morbid obesity ([place]) 12/24/2012  . Congenital quadriplegia ([place]) 12/24/2012  . URINARY INCONTINENCE 02/25/2010  . Full incontinence of feces 02/25/2010  . DIGESTIVE SYSTEM COMPLICATION NEC 54/00/8676  . Hypothyroidism 01/29/2009  . 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 Infantile cerebral palsy ([place]) 01/29/2009  . GASTROESOPHAGEAL REFLUX DISEASE 01/29/2009  . Osteoporosis 01/29/2009  . Seizure disorder ([place]) 01/29/2009    Andrey Spearman, MS, OTR/L, [facility] 08/15/17 9:51 AM  [place] MAIN [facility] SERVICES [address] [place], [place], [address] Phone: [phone]   Fax:  [phone]  Name: Michele [name] MRN: [id number] Date of Birth: 1986/06/02

## 2017-08-15 NOTE — Therapy (Signed)
[place] MAIN [facility] SERVICES [address] [place], [place], [address] Phone: [phone]   Fax:  [phone]  Occupational Therapy Treatment  Patient Details  Name: Michele [name] MRN: [id number] Date of Birth: [birth year] Referring Provider: Everrett Coombe, MD   Encounter Date: 08/15/2017  OT End of Session - 08/15/17 1305    Visit Number  8    Number of Visits  36    Date for OT Re-Evaluation  10/11/17    OT Start Time  0830    OT Stop Time  0930    OT Time Calculation (min)  60 min    Activity Tolerance  Patient tolerated treatment well;No increased pain;Other (comment) limited by impaired    mobility, ambulation , language limitations and significant intellectual delay    Behavior During Therapy  [facility] for tasks assessed/performed       Past Medical History:  Diagnosis Date  . Cerebral palsy ([place])   . Hypothyroidism   . Lennox-Gastaut syndrome ([place])   . Osteoporosis   . Pneumonia   . Profound mental retardation   . Reflux   . Rickets   . Seizures ([place])     Past Surgical History:  Procedure Laterality Date  . FOOT MASS EXCISION Right   . Gastrocutaneous fistual repair  2012  . PEG TUBE PLACEMENT      There were no vitals filed for this visit.  Subjective Assessment - 08/15/17 1304    Subjective   Nabila presents for OT visit 8/ 36 for CDT to address BLE lymphedema. Pt is accommpanied by her mother today. Pt presents with LLE knee length compression wrap in place. Pt and mother have no new complaints or concerns.    Patient is accompained by:  Family member    Pertinent History  CP, Hypothyroidism, Lennox-Gastaut Syndrome, Osteoporosis, hx pneumonia, profound MR, rickets, seizure discorder, R foot     Currently in Pain?  No/denies          LYMPHEDEMA/ONCOLOGY QUESTIONNAIRE - 08/15/17 1307      Left Lower Extremity Lymphedema   Other  LLE ankle-to popliteal (A-D) limb volume measures 2548.55 ml.    Other  LLE is decreased  an additional 7.38% below the knee since last measured on 2/25, and by a total of 12.54% since  initially measured on 2/11/20019.              OT Treatments/Exercises (OP) - 08/15/17 1306      ADLs   ADL Education Given  Yes      Manual Therapy   Manual Therapy  Edema management;Compression Bandaging;Manual Lymphatic Drainage (MLD)    Manual therapy comments  lle COMPARATIVE LIMV VOLUMETRICS    Edema Management  skin care to LLE w/ low ph [facility] (low ph, skin grade castor oil) during MLD to hydrate skin and improve skin flexibility    Manual Lymphatic Drainage (MLD)  MLD to LLE utilizing short neck sequence, deep abdominal lymphatics, and functional inguinal and politeal LN.    Compression Bandaging  12 cm Rosidal foam applied over cotton stockinet to tibial tuberosity, overlapping equally and w/ only enopugh tensioon to ensure close contour without wronkles and bunching.. 10 and 12 cm compression wraps applied inm graduated layers using gradient techniques. Stretch net over all.             OT Education - 08/15/17 1305    Education provided  Yes    Education Details  Continued skilled Pt/caregiver  education  And LE ADL training throughout visit for lymphedema self care/ home program, including compression wrapping, compression garment and device wear/care, lymphatic pumping ther ex, simple self-MLD, and skin care. Discussed progress towards goals.    Person(s) Educated  Patient;Parent(s)    Methods  Explanation;Demonstration    Comprehension  Verbalized understanding;Returned demonstration          OT Long Term Goals - 08/13/17 1025      OT LONG TERM GOAL #1   Title  Pt to follow Lymphedema precautions and prevention strategies with maximum caregiver assistance to limit LE progression and further functional decline. (Primary caregiver modified independent w/ lymphedema precautions and prevention principals and strategies using printed reference to limit LE  progression and infection risk.)    Baseline  Max A Goal Met 08/06/17    Time  2    Period  Weeks    Status  Achieved      OT LONG TERM GOAL #2   Title  Lymphedema (LE) management/ self-care: Pt able to apply knee length, multi layered, gradient compression wraps, one leg at a time, with max caregiver assist x 1 using proper techniques within 2 weeks to achieve optimal limb volume reduction in preparation for fitting compression garments/ devices.    Baseline  Dependent Goal Met 08/06/17    Time  2    Period  Weeks    Status  Achieved      OT LONG TERM GOAL #3   Title  Lymphedema (LE) management/ self-care:  Pt to achieve at least 10%  BLE limb volume reductions  during Intensive Phase CDT to limit LE progression, to reduce infection risk, and to improve functional mobility and transfers essential for optimal ADLs performance.    Baseline  Dependent  Nearly met w/ 9.86% limb volume reduction measured 08/06/17 LLE GOAL MET 08/13/17 w/ 11.5% reduction.    Time  12    Period  Weeks    Status  Partially Met      OT LONG TERM GOAL #4   Title  Lymphedema (LE) management/ self-care:  Pt to tolerate daily compression wraps, compression garments and/ or HOS devices in keeping w/ prescribed wear regimes within 1 week of issue date of each to progress and retain clinical and functional gains and to limit LE progression.    Baseline  Max A    Time  12    Period  Weeks    Status  New      OT LONG TERM GOAL #5   Title  Lymphedema (LE) management/ self-care:  During Management Phase CDT Pt to sustain current limb volumes within 3%, and all other clinical gains achieved during OT treatment iwith maximum caregiver assistance  (to control limb swelling and associated pain/ discomfort, to  limit LE progression, to decrease infection risk and to limit further functional decline.    Baseline  dependent    Time  6    Period  Months    Status  New            Plan - 08/15/17 1309    Clinical Impression  Statement   Pt continues to demonstrate progrtess towards OT goals for CDT.  LLE comparative limb volumetrics  reveals additiona limb volume decrease this week. LLE is decreased an additional 7.38% below the knee since last measured on 2/25, and by a total of 12.54% since  initially measured on 2/11/20019.Marland Kitchen Pt had no difficulty tolerating MLD and compression wraps today. Parent/CG demonstrated  good understanding of recommended custom compression garments after skilled teaching. Cont as per POC. [place] gatment measurements for LLE next week.     Occupational Profile and client history currently impacting functional performance  intractible seizures, CP, intelectual delay, quadriparesis, dependent on others for all besic and instrumental ADLs    Occupational performance deficits (Please refer to evaluation for details):  ADL's;IADL's;Work;Play;Leisure;Social Participation;Rest and Sleep;Education    Rehab Potential  Good    Current Impairments/barriers affecting progress:  impaired intellectual delay and quadraparesis    OT Frequency  3x / week    OT Treatment/Interventions  Self-care/ADL training;DME and/or AE instruction;Manual Therapy;Patient/family education;Manual lymph drainage;Compression bandaging;Therapeutic activities;Other (comment) BLE skin care during MLD with low ph Eucerin lotion and Palma Christii, skin grade castor oil.    Clinical Decision Making  Multiple treatment options, significant modification of task necessary    Recommended Other Services  Fit with appropriate, knee length, BLE compression garments and or devices- For daytime consider custom, flat knit, open toe, Jobst Elvarex elastic classic stockings  And /Or adjustable, Velcro close, CircAid, short stretch leg and foot piece for ease of donning and doffing. Fit w/ HOS devices, ( consider Jobst, ccl 2 Relax) to limit fibrosis formation and improve lymphatic transport during HOS.       Patient will benefit from skilled  therapeutic intervention in order to improve the following deficits and impairments:  Decreased knowledge of use of DME, Decreased skin integrity, Increased edema, Decreased knowledge of precautions, Decreased safety awareness, Difficulty walking, Obesity, Decreased balance  Visit Diagnosis: Lymphedema, not elsewhere classified    Problem List Patient Active Problem List   Diagnosis Date Noted  . Lennox-Gastaut syndrome ([place]) 07/06/2017  . Acute decrease in level of consciousness 06/29/2017  . Lymphedema 01/22/2017  . HCAP (healthcare-associated pneumonia) 12/06/2016  . Arterial hypotension   . Sepsis ([place]) 11/04/2016  . Other constipation   . Bilateral lower extremity edema 04/21/2016  . Unsteadiness on feet   . Community acquired pneumonia of left lower lobe of lung ([place]) 04/05/2016  . Cough 04/03/2016  . Rhinitis, non-allergic 06/23/2015  . Special screening examination for human papillomavirus (HPV) 07/20/2014  . Amenorrhea 05/19/2014  . 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 Long-term use of high-risk medication 12/24/2012  . Morbid obesity ([place]) 12/24/2012  . Congenital quadriplegia ([place]) 12/24/2012  . URINARY INCONTINENCE 02/25/2010  . Full incontinence of feces 02/25/2010  . DIGESTIVE SYSTEM COMPLICATION NEC 31/51/7616  . Hypothyroidism 01/29/2009  . 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 Infantile cerebral palsy ([place]) 01/29/2009  . GASTROESOPHAGEAL REFLUX DISEASE 01/29/2009  . Osteoporosis 01/29/2009  . Seizure disorder ([place]) 01/29/2009    Andrey Spearman, MS, OTR/L, [facility] 08/15/17 1:15 PM  [place] MAIN [facility] SERVICES [address] [place], [place], [address] Phone: [phone]   Fax:  [phone]  Name: Michele [name] MRN: [id number] Date of Birth: 08/06/1985

## 2017-08-20 ENCOUNTER — Ambulatory Visit: Payer: Medicare Other | Attending: Family Medicine | Admitting: Occupational Therapy

## 2017-08-20 DIAGNOSIS — I890 Lymphedema, not elsewhere classified: Secondary | ICD-10-CM | POA: Diagnosis not present

## 2017-08-20 NOTE — Therapy (Signed)
[place] MAIN [facility] SERVICES [address] [place], [place], [address] Phone: [phone]   Fax:  [phone]  Occupational Therapy Treatment  Patient Details  Name: Michele [name] MRN: [id number] Date of Birth: Mar 06, 1986 Referring Provider: Everrett Coombe, MD   Encounter Date: 08/20/2017  OT End of Session - 08/20/17 1203    Visit Number  9    Number of Visits  36    Date for OT Re-Evaluation  10/11/17    OT Start Time  0905    OT Stop Time  1005    OT Time Calculation (min)  60 min    Activity Tolerance  Patient tolerated treatment well;No increased pain;Other (comment) limited by impaired    mobility, ambulation , language limitations and significant intellectual delay    Behavior During Therapy  [facility] for tasks assessed/performed       Past Medical History:  Diagnosis Date  . Cerebral palsy ([place])   . Hypothyroidism   . Lennox-Gastaut syndrome ([place])   . Osteoporosis   . Pneumonia   . Profound mental retardation   . Reflux   . Rickets   . Seizures ([place])     Past Surgical History:  Procedure Laterality Date  . FOOT MASS EXCISION Right   . Gastrocutaneous fistual repair  2012  . PEG TUBE PLACEMENT      There were no vitals filed for this visit.  Subjective Assessment - 08/20/17 1200    Subjective   Michele [name] presents for OT visit 89 36 for CDT to address BLE lymphedema. Pt is accommpanied by her mother today. Pt presents with LLE knee length compression wrap in place. Pt's mother reports Pt tolerated compression wraps all weekend without difficulty.    Patient is accompained by:  Family member    Pertinent History  CP, Hypothyroidism, Lennox-Gastaut Syndrome, Osteoporosis, hx pneumonia, profound MR, rickets, seizure discorder, R foot     Currently in Pain?  No/denies                   OT Treatments/Exercises (OP) - 08/20/17 0001      ADLs   ADL Education Given  Yes      Manual Therapy   Manual Therapy  Edema  management;Compression Bandaging    Manual therapy comments  completed LLE anatomical measuremebnts for custon, knee length, elastic compression garments and HOS device.    Compression Bandaging  12 cm Rosidal foam applied over cotton stockinet to tibial tuberosity, overlapping equally and w/ only enopugh tensioon to ensure close contour without wronkles and bunching.. 10 and 12 cm compression wraps applied inm graduated layers using gradient techniques. Stretch net over all.             OT Education - 08/20/17 1202    Education provided  Yes    Education Details  Parent/ CG educated regardomg compression garment options and recommendations for Pt. Provided rational for why custom is recommended over OTS, and why HOS device is medically necessary.    Person(s) Educated  Parent(s)    Methods  Explanation;Demonstration    Comprehension  Verbalized understanding;Returned demonstration          OT Long Term Goals - 08/13/17 1025      OT LONG TERM GOAL #1   Title  Pt to follow Lymphedema precautions and prevention strategies with maximum caregiver assistance to limit LE progression and further functional decline. (Primary caregiver modified independent w/ lymphedema precautions and prevention principals and strategies  using printed reference to limit LE progression and infection risk.)    Baseline  Max A Goal Met 08/06/17    Time  2    Period  Weeks    Status  Achieved      OT LONG TERM GOAL #2   Title  Lymphedema (LE) management/ self-care: Pt able to apply knee length, multi layered, gradient compression wraps, one leg at a time, with max caregiver assist x 1 using proper techniques within 2 weeks to achieve optimal limb volume reduction in preparation for fitting compression garments/ devices.    Baseline  Dependent Goal Met 08/06/17    Time  2    Period  Weeks    Status  Achieved      OT LONG TERM GOAL #3   Title  Lymphedema (LE) management/ self-care:  Pt to achieve at least  10%  BLE limb volume reductions  during Intensive Phase CDT to limit LE progression, to reduce infection risk, and to improve functional mobility and transfers essential for optimal ADLs performance.    Baseline  Dependent  Nearly met w/ 9.86% limb volume reduction measured 08/06/17 LLE GOAL MET 08/13/17 w/ 11.5% reduction.    Time  12    Period  Weeks    Status  Partially Met      OT LONG TERM GOAL #4   Title  Lymphedema (LE) management/ self-care:  Pt to tolerate daily compression wraps, compression garments and/ or HOS devices in keeping w/ prescribed wear regimes within 1 week of issue date of each to progress and retain clinical and functional gains and to limit LE progression.    Baseline  Max A    Time  12    Period  Weeks    Status  New      OT LONG TERM GOAL #5   Title  Lymphedema (LE) management/ self-care:  During Management Phase CDT Pt to sustain current limb volumes within 3%, and all other clinical gains achieved during OT treatment iwith maximum caregiver assistance  (to control limb swelling and associated pain/ discomfort, to  limit LE progression, to decrease infection risk and to limit further functional decline.    Baseline  dependent    Time  6    Period  Months    Status  New            Plan - 08/20/17 1203    Clinical Impression Statement  Completed LLE anatomical measuurements for Jobst Elvarex SOFT, custom, elastic, knee length, ccl 3 compression stocking, and ccl 2 Jobst RELAX custom , knee length HOS device. Both garments and devices are mnedically necessary to meet Pt's specific compression and containmenrneeds for long term LE management, and to limit increaed fibrosis formation during HOS. Submitted to vendor, and wioll fit ASAP. Completed LLE compression wraps as established.    Occupational Profile and client history currently impacting functional performance  intractible seizures, CP, intelectual delay, quadriparesis, dependent on others for all besic and  instrumental ADLs    Occupational performance deficits (Please refer to evaluation for details):  ADL's;IADL's;Work;Play;Leisure;Social Participation;Rest and Sleep;Education    Rehab Potential  Good    Current Impairments/barriers affecting progress:  impaired intellectual delay and quadraparesis    OT Frequency  3x / week    OT Treatment/Interventions  Self-care/ADL training;DME and/or AE instruction;Manual Therapy;Patient/family education;Manual lymph drainage;Compression bandaging;Therapeutic activities;Other (comment) BLE skin care during MLD with low ph Eucerin lotion and Pal[place] Christii, skin grade castor oil.    Clinical  Decision Making  Multiple treatment options, significant modification of task necessary    Recommended Other Services  Fit with appropriate, knee length, BLE compression garments and or devices- For daytime consider custom, flat knit, open toe, Jobst Elvarex elastic classic stockings  And /Or adjustable, Velcro close, CircAid, short stretch leg and foot piece for ease of donning and doffing. Fit w/ HOS devices, ( consider Jobst, ccl 2 Relax) to limit fibrosis formation and improve lymphatic transport during HOS.       Patient will benefit from skilled therapeutic intervention in order to improve the following deficits and impairments:  Decreased knowledge of use of DME, Decreased skin integrity, Increased edema, Decreased knowledge of precautions, Decreased safety awareness, Difficulty walking, Obesity, Decreased balance  Visit Diagnosis: Lymphedema, not elsewhere classified    Problem List Patient Active Problem List   Diagnosis Date Noted  . Lennox-Gastaut syndrome ([place]) 07/06/2017  . Acute decrease in level of consciousness 06/29/2017  . Lymphedema 01/22/2017  . HCAP (healthcare-associated pneumonia) 12/06/2016  . Arterial hypotension   . Sepsis ([place]) 11/04/2016  . Other constipation   . Bilateral lower extremity edema 04/21/2016  . Unsteadiness on feet   .  Community acquired pneumonia of left lower lobe of lung ([place]) 04/05/2016  . Cough 04/03/2016  . Rhinitis, non-allergic 06/23/2015  . Special screening examination for human papillomavirus (HPV) 07/20/2014  . Amenorrhea 05/19/2014  . 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 Long-term use of high-risk medication 12/24/2012  . Morbid obesity ([place]) 12/24/2012  . Congenital quadriplegia ([place]) 12/24/2012  . URINARY INCONTINENCE 02/25/2010  . Full incontinence of feces 02/25/2010  . DIGESTIVE SYSTEM COMPLICATION NEC 50/56/9794  . Hypothyroidism 01/29/2009  . 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 Infantile cerebral palsy ([place]) 01/29/2009  . GASTROESOPHAGEAL REFLUX DISEASE 01/29/2009  . Osteoporosis 01/29/2009  . Seizure disorder ([place]) 01/29/2009    Andrey Spearman, MS, OTR/L, [facility] 08/20/17 12:07 PM   [place] MAIN [facility] SERVICES [address] [place], [place], [address] Phone: [phone]   Fax:  [phone]  Name: Michele [name] MRN: [id number] Date of Birth: Feb 28, 1986

## 2017-08-22 ENCOUNTER — Ambulatory Visit: Payer: Medicare Other | Admitting: Occupational Therapy

## 2017-08-22 DIAGNOSIS — I890 Lymphedema, not elsewhere classified: Secondary | ICD-10-CM | POA: Diagnosis not present

## 2017-08-22 NOTE — Therapy (Signed)
[place] MAIN [facility] SERVICES [address] [place], [place], [address] Phone: [phone]   Fax:  [phone]  Occupational Therapy Treatment Note and Progress Report:  Lymphedema Care  Patient Details  Name: Michele [name] MRN: [id number] Date of Birth: 25-Jul-1985 Referring Provider: Everrett Coombe, MD   Encounter Date: 08/22/2017  OT End of Session - 08/22/17 0910    Visit Number  10    Number of Visits  36    Date for OT Re-Evaluation  10/11/17    OT Start Time  0905    OT Stop Time  0955    OT Time Calculation (min)  50 min    Activity Tolerance  Patient tolerated treatment well;No increased pain;Other (comment) limited by impaired    mobility, ambulation , language limitations and significant intellectual delay    Behavior During Therapy  [facility] for tasks assessed/performed       Past Medical History:  Diagnosis Date  . Cerebral palsy ([place])   . Hypothyroidism   . Lennox-Gastaut syndrome ([place])   . Osteoporosis   . Pneumonia   . Profound mental retardation   . Reflux   . Rickets   . Seizures ([place])     Past Surgical History:  Procedure Laterality Date  . FOOT MASS EXCISION Right   . Gastrocutaneous fistual repair  2012  . PEG TUBE PLACEMENT      There were no vitals filed for this visit.  Subjective Assessment - 08/22/17 1120    Subjective   Willia presents for OT visit 10 36 for CDT to address BLE lymphedema. Pt is accommpanied by her mother today. Pt presents with LLE knee length compression wrap in place. Pt's mother reports Pt tolerated compression wraps all weekend without difficulty.    Patient is accompained by:  Family member    Pertinent History  CP, Hypothyroidism, Lennox-Gastaut Syndrome, Osteoporosis, hx pneumonia, profound MR, rickets, seizure discorder, R foot                    OT Treatments/Exercises (OP) - 08/22/17 0001      ADLs   ADL Education Given  Yes      Manual Therapy   Manual Therapy   Edema management;Compression Bandaging;Manual Lymphatic Drainage (MLD)    Edema Management  skin care to LLE w/ low ph [facility] (low ph, skin grade castor oil) during MLD to hydrate skin and improve skin flexibility    Manual Lymphatic Drainage (MLD)  MLD to LLE and RLE utilizing short neck sequence, deep abdominal lymphatics, and functional inguinal and politeal LN.    Compression Bandaging  12 cm Rosidal foam applied over cotton stockinet to tibial tuberosity, overlapping equally and w/ only enopugh tensioon to ensure close contour without wronkles and bunching.. 10 and 12 cm compression wraps applied inm graduated layers using gradient techniques. Stretch net over all.             OT Education - 08/22/17 1121    Education provided  Yes    Education Details  Continued skilled Pt/caregiver education  And LE ADL training throughout visit for lymphedema self care/ home program, including compression wrapping, compression garment and device wear/care, lymphatic pumping ther ex, simple self-MLD, and skin care. Discussed progress towards goals.    Person(s) Educated  Parent(s)    Methods  Explanation;Demonstration    Comprehension  Verbalized understanding;Returned demonstration          OT Long Term Goals -  08/22/17 1122      OT LONG TERM GOAL #1   Title  Pt to follow Lymphedema precautions and prevention strategies with maximum caregiver assistance to limit LE progression and further functional decline. (Primary caregiver modified independent w/ lymphedema precautions and prevention principals and strategies using printed reference to limit LE progression and infection risk.)    Baseline  Max A  08/22/2017: Parent/ Primary CG achieved.    Time  2    Period  Weeks    Status  Achieved      OT LONG TERM GOAL #2   Title  Lymphedema (LE) management/ self-care: Pt able to apply knee length, multi layered, gradient compression wraps, one leg at a time, with max caregiver assist x 1 using  proper techniques within 2 weeks to achieve optimal limb volume reduction in preparation for fitting compression garments/ devices.    Baseline  Dependent    08/22/2017: Parent/ Primary CG achieved. Independent    Time  2    Period  Weeks    Status  Achieved      OT LONG TERM GOAL #3   Title  Lymphedema (LE) management/ self-care:  Pt to achieve at least 10%  BLE limb volume reductions  during Intensive Phase CDT to limit LE progression, to reduce infection risk, and to improve functional mobility and transfers essential for optimal ADLs performance.    Baseline  Dependent  08/22/2017: achied for LLE w/ 12% thus far.    Time  12    Period  Weeks    Status  Partially Met      OT LONG TERM GOAL #4   Title  Lymphedema (LE) management/ self-care:  Pt to tolerate daily compression wraps, compression garments and/ or HOS devices in keeping w/ prescribed wear regimes within 1 week of issue date of each to progress and retain clinical and functional gains and to limit LE progression.    Baseline  Max A 08/22/2017: Partially met. Pt tolerated LLE compression wraps without difficulty. LLE custom day and night time compression garments are ordered. awaiting Medicaid preauthorization.    Time  12    Period  Weeks    Status  New      OT LONG TERM GOAL #5   Title  Lymphedema (LE) management/ self-care:  During Management Phase CDT Pt to sustain current limb volumes within 3%, and all other clinical gains achieved during OT treatment iwith maximum caregiver assistance  (to control limb swelling and associated pain/ discomfort, to  limit LE progression, to decrease infection risk and to limit further functional decline.    Baseline  dependent    Time  6    Period  Months    Status  On-going            Plan - 08/22/17 1156    Clinical Impression Statement  Pt tolerated LLE MLD, skin care and compression wraps to knee without difficulty today. Pt continues to demonstrate progress towards all goals . See  Goals section for update on each. LLE custom compression garments have been ordered. We are awaiting Medicaid preautorization . Once LLE garments aare fitted, we'll commence RLE CDT. Cont as per POC.    Occupational Profile and client history currently impacting functional performance  intractible seizures, CP, intelectual delay, quadriparesis, dependent on others for all besic and instrumental ADLs    Occupational performance deficits (Please refer to evaluation for details):  ADL's;IADL's;Work;Play;Leisure;Social Participation;Rest and Sleep;Education    Rehab Potential  Good  Current Impairments/barriers affecting progress:  impaired intellectual delay and quadraparesis    OT Frequency  3x / week    OT Treatment/Interventions  Self-care/ADL training;DME and/or AE instruction;Manual Therapy;Patient/family education;Manual lymph drainage;Compression bandaging;Therapeutic activities;Other (comment) BLE skin care during MLD with low ph Eucerin lotion and Palma Christii, skin grade castor oil.    Clinical Decision Making  Multiple treatment options, significant modification of task necessary    Recommended Other Services  Fit with appropriate, knee length, BLE compression garments and or devices- For daytime consider custom, flat knit, open toe, Jobst Elvarex elastic classic stockings  And /Or adjustable, Velcro close, CircAid, short stretch leg and foot piece for ease of donning and doffing. Fit w/ HOS devices, ( consider Jobst, ccl 2 Relax) to limit fibrosis formation and improve lymphatic transport during HOS.       Patient will benefit from skilled therapeutic intervention in order to improve the following deficits and impairments:  Decreased knowledge of use of DME, Decreased skin integrity, Increased edema, Decreased knowledge of precautions, Decreased safety awareness, Difficulty walking, Obesity, Decreased balance  Visit Diagnosis: Lymphedema, not elsewhere classified    Problem  List Patient Active Problem List   Diagnosis Date Noted  . Lennox-Gastaut syndrome ([place]) 07/06/2017  . Acute decrease in level of consciousness 06/29/2017  . Lymphedema 01/22/2017  . HCAP (healthcare-associated pneumonia) 12/06/2016  . Arterial hypotension   . Sepsis ([place]) 11/04/2016  . Other constipation   . Bilateral lower extremity edema 04/21/2016  . Unsteadiness on feet   . Community acquired pneumonia of left lower lobe of lung ([place]) 04/05/2016  . Cough 04/03/2016  . Rhinitis, non-allergic 06/23/2015  . Special screening examination for human papillomavirus (HPV) 07/20/2014  . Amenorrhea 05/19/2014  . 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 Long-term use of high-risk medication 12/24/2012  . Morbid obesity ([place]) 12/24/2012  . Congenital quadriplegia ([place]) 12/24/2012  . URINARY INCONTINENCE 02/25/2010  . Full incontinence of feces 02/25/2010  . DIGESTIVE SYSTEM COMPLICATION NEC 36/46/8032  . Hypothyroidism 01/29/2009  . 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 Infantile cerebral palsy ([place]) 01/29/2009  . GASTROESOPHAGEAL REFLUX DISEASE 01/29/2009  . Osteoporosis 01/29/2009  . Seizure disorder ([place]) 01/29/2009    Andrey Spearman, MS, OTR/L, [facility] 08/22/17 11:59 AM  [place] MAIN [facility] SERVICES [address] [place], [place], [address] Phone: [phone]   Fax:  [phone]  Name: Michele [name] MRN: [id number] Date of Birth: Nov 24, 1985

## 2017-08-27 ENCOUNTER — Ambulatory Visit: Payer: Medicare Other | Admitting: Occupational Therapy

## 2017-08-27 DIAGNOSIS — I890 Lymphedema, not elsewhere classified: Secondary | ICD-10-CM | POA: Diagnosis not present

## 2017-08-27 NOTE — Therapy (Signed)
[place] MAIN [facility] SERVICES [address] [place], [place], [address] Phone: [phone]   Fax:  [phone]  Occupational Therapy Treatment  Patient Details  Name: Michele [name] MRN: [id number] Date of Birth: 06-Sep-1985 Referring Provider: Everrett Coombe, MD   Encounter Date: 08/27/2017  OT End of Session - 08/27/17 1209    Visit Number  11    Number of Visits  36    Date for OT Re-Evaluation  10/11/17    OT Start Time  0910    OT Stop Time  0957    OT Time Calculation (min)  47 min    Activity Tolerance  Patient tolerated treatment well;No increased pain;Other (comment) limited by impaired    mobility, ambulation , language limitations and significant intellectual delay    Behavior During Therapy  [facility] for tasks assessed/performed       Past Medical History:  Diagnosis Date  . Cerebral palsy ([place])   . Hypothyroidism   . Lennox-Gastaut syndrome ([place])   . Osteoporosis   . Pneumonia   . Profound mental retardation   . Reflux   . Rickets   . Seizures ([place])     Past Surgical History:  Procedure Laterality Date  . FOOT MASS EXCISION Right   . Gastrocutaneous fistual repair  2012  . PEG TUBE PLACEMENT      There were no vitals filed for this visit.  Subjective Assessment - 08/27/17 1206    Subjective   Michele [name] presents for OT visit 11/ 36 for CDT to address BLE lymphedema. Pt is accommpanied by her mother today. Pt presents with LLE knee length compression wrap in place. Mother tells me she has not yet heard from DME vendor [place] custom compression garments ordered last week.    Patient is accompained by:  Family member    Pertinent History  CP, Hypothyroidism, Lennox-Gastaut Syndrome, Osteoporosis, hx pneumonia, profound MR, rickets, seizure discorder, R foot     Currently in Pain?  No/denies                   OT Treatments/Exercises (OP) - 08/27/17 0001      ADLs   ADL Education Given  Yes      Manual Therapy    Manual Therapy  Edema management;Compression Bandaging;Manual Lymphatic Drainage (MLD)    Edema Management  skin care to LLE w/ low ph [facility] (low ph, skin grade castor oil) during MLD to hydrate skin and improve skin flexibility    Manual Lymphatic Drainage (MLD)  MLD to LLE and RLE utilizing short neck sequence, deep abdominal lymphatics, and functional inguinal and politeal LN.    Compression Bandaging  12 cm Rosidal foam applied over cotton stockinet to tibial tuberosity, overlapping equally and w/ only enopugh tensioon to ensure close contour without wronkles and bunching.. 10 and 12 cm compression wraps applied inm graduated layers using gradient techniques. Stretch net over all.             OT Education - 08/27/17 1208    Education provided  Yes    Education Details  Continued skilled Pt/caregiver education  And LE ADL training throughout visit for lymphedema self care/ home program, including compression wrapping, compression garment and device wear/care, lymphatic pumping ther ex, simple self-MLD, and skin care. Discussed progress towards goals.    Person(s) Educated  Parent(s)    Methods  Explanation;Demonstration    Comprehension  Verbalized understanding;Returned demonstration  OT Long Term Goals - 08/22/17 1122      OT LONG TERM GOAL #1   Title  Pt to follow Lymphedema precautions and prevention strategies with maximum caregiver assistance to limit LE progression and further functional decline. (Primary caregiver modified independent w/ lymphedema precautions and prevention principals and strategies using printed reference to limit LE progression and infection risk.)    Baseline  Max A  08/22/2017: Parent/ Primary CG achieved.    Time  2    Period  Weeks    Status  Achieved      OT LONG TERM GOAL #2   Title  Lymphedema (LE) management/ self-care: Pt able to apply knee length, multi layered, gradient compression wraps, one leg at a time, with max  caregiver assist x 1 using proper techniques within 2 weeks to achieve optimal limb volume reduction in preparation for fitting compression garments/ devices.    Baseline  Dependent    08/22/2017: Parent/ Primary CG achieved. Independent    Time  2    Period  Weeks    Status  Achieved      OT LONG TERM GOAL #3   Title  Lymphedema (LE) management/ self-care:  Pt to achieve at least 10%  BLE limb volume reductions  during Intensive Phase CDT to limit LE progression, to reduce infection risk, and to improve functional mobility and transfers essential for optimal ADLs performance.    Baseline  Dependent  08/22/2017: achied for LLE w/ 12% thus far.    Time  12    Period  Weeks    Status  Partially Met      OT LONG TERM GOAL #4   Title  Lymphedema (LE) management/ self-care:  Pt to tolerate daily compression wraps, compression garments and/ or HOS devices in keeping w/ prescribed wear regimes within 1 week of issue date of each to progress and retain clinical and functional gains and to limit LE progression.    Baseline  Max A 08/22/2017: Partially met. Pt tolerated LLE compression wraps without difficulty. LLE custom day and night time compression garments are ordered. awaiting Medicaid preauthorization.    Time  12    Period  Weeks    Status  New      OT LONG TERM GOAL #5   Title  Lymphedema (LE) management/ self-care:  During Management Phase CDT Pt to sustain current limb volumes within 3%, and all other clinical gains achieved during OT treatment iwith maximum caregiver assistance  (to control limb swelling and associated pain/ discomfort, to  limit LE progression, to decrease infection risk and to limit further functional decline.    Baseline  dependent    Time  6    Period  Months    Status  On-going            Plan - 08/27/17 1209    Clinical Impression Statement  [name] tolerated manual therapies to LLE this morning without difficulty. It appears  that LLE limb volume reduction is  approaching a clinical plateau. LLE custom garment and HOS device have been ordered. Mom is doing an excellent job between visits assistinf Pt w/ home program. Cont as per POC. Commence CDT to less affected RLE as soon as KLLE garments are fitted.    Occupational Profile and client history currently impacting functional performance  intractible seizures, CP, intelectual delay, quadriparesis, dependent on others for all besic and instrumental ADLs    Occupational performance deficits (Please refer to evaluation for details):  ADL's;IADL's;Work;Play;Leisure;Social Participation;Rest and Sleep;Education    Rehab Potential  Good    Current Impairments/barriers affecting progress:  impaired intellectual delay and quadraparesis    OT Frequency  3x / week    OT Treatment/Interventions  Self-care/ADL training;DME and/or AE instruction;Manual Therapy;Patient/family education;Manual lymph drainage;Compression bandaging;Therapeutic activities;Other (comment) BLE skin care during MLD with low ph Eucerin lotion and Palma Christii, skin grade castor oil.    Clinical Decision Making  Multiple treatment options, significant modification of task necessary    Recommended Other Services  Fit with appropriate, knee length, BLE compression garments and or devices- For daytime consider custom, flat knit, open toe, Jobst Elvarex elastic classic stockings  And /Or adjustable, Velcro close, CircAid, short stretch leg and foot piece for ease of donning and doffing. Fit w/ HOS devices, ( consider Jobst, ccl 2 Relax) to limit fibrosis formation and improve lymphatic transport during HOS.       Patient will benefit from skilled therapeutic intervention in order to improve the following deficits and impairments:  Decreased knowledge of use of DME, Decreased skin integrity, Increased edema, Decreased knowledge of precautions, Decreased safety awareness, Difficulty walking, Obesity, Decreased balance  Visit Diagnosis: Lymphedema,  not elsewhere classified    Problem List Patient Active Problem List   Diagnosis Date Noted  . Lennox-Gastaut syndrome ([place]) 07/06/2017  . Acute decrease in level of consciousness 06/29/2017  . Lymphedema 01/22/2017  . HCAP (healthcare-associated pneumonia) 12/06/2016  . Arterial hypotension   . Sepsis ([place]) 11/04/2016  . Other constipation   . Bilateral lower extremity edema 04/21/2016  . Unsteadiness on feet   . Community acquired pneumonia of left lower lobe of lung ([place]) 04/05/2016  . Cough 04/03/2016  . Rhinitis, non-allergic 06/23/2015  . Special screening examination for human papillomavirus (HPV) 07/20/2014  . Amenorrhea 05/19/2014  . 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 Long-term use of high-risk medication 12/24/2012  . Morbid obesity ([place]) 12/24/2012  . Congenital quadriplegia ([place]) 12/24/2012  . URINARY INCONTINENCE 02/25/2010  . Full incontinence of feces 02/25/2010  . DIGESTIVE SYSTEM COMPLICATION NEC 88/89/1694  . Hypothyroidism 01/29/2009  . 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 Infantile cerebral palsy ([place]) 01/29/2009  . GASTROESOPHAGEAL REFLUX DISEASE 01/29/2009  . Osteoporosis 01/29/2009  . Seizure disorder ([place]) 01/29/2009    Andrey Spearman, MS, OTR/L, [facility] 08/27/17 12:12 PM  [place] MAIN [facility] SERVICES [address] [place], [place], [address] Phone: [phone]   Fax:  [phone]  Name: Michele [name] MRN: [id number] Date of Birth: [birth year]

## 2017-08-28 ENCOUNTER — Encounter (INDEPENDENT_AMBULATORY_CARE_PROVIDER_SITE_OTHER): Payer: Self-pay | Admitting: Family

## 2017-08-28 ENCOUNTER — Telehealth (INDEPENDENT_AMBULATORY_CARE_PROVIDER_SITE_OTHER): Payer: Self-pay | Admitting: Family

## 2017-08-28 NOTE — Telephone Encounter (Signed)
PAs have been done. Waiting on Approvals

## 2017-08-28 NOTE — Telephone Encounter (Signed)
°  Who's calling (name and relationship to patient) : [name] (mom) Best contact number: [phone] Provider they see: [phone] Reason for call: Mom called stated she need a PA for patient's medication     PRESCRIPTION REFILL ONLY  Name of prescription: All meds   Pharmacy: [facility] [phone] [address]

## 2017-08-29 ENCOUNTER — Ambulatory Visit: Payer: Medicare Other | Admitting: Occupational Therapy

## 2017-08-29 DIAGNOSIS — I890 Lymphedema, not elsewhere classified: Secondary | ICD-10-CM

## 2017-08-29 NOTE — Therapy (Signed)
[place] MAIN [facility] SERVICES [address] [place], [place], [address] Phone: [phone]   Fax:  [phone]  Occupational Therapy Treatment  Patient Details  Name: Michele [name] MRN: [id number] Date of Birth: 10/23/1985 Referring Provider: Everrett Coombe, MD   Encounter Date: 08/29/2017  OT End of Session - 08/29/17 0959    Visit Number  12    Number of Visits  36    Date for OT Re-Evaluation  10/11/17    OT Start Time  0804    OT Stop Time  0901    OT Time Calculation (min)  57 min    Activity Tolerance  Patient tolerated treatment well;No increased pain;Other (comment) limited by impaired    mobility, ambulation , language limitations and significant intellectual delay    Behavior During Therapy  [facility] for tasks assessed/performed       Past Medical History:  Diagnosis Date  . Cerebral palsy ([place])   . Hypothyroidism   . Lennox-Gastaut syndrome ([place])   . Osteoporosis   . Pneumonia   . Profound mental retardation   . Reflux   . Rickets   . Seizures ([place])     Past Surgical History:  Procedure Laterality Date  . FOOT MASS EXCISION Right   . Gastrocutaneous fistual repair  2012  . PEG TUBE PLACEMENT      There were no vitals filed for this visit.  Subjective Assessment - 08/29/17 0908    Subjective   Michele [name] presents for OT visit 11/ 36 for CDT to address BLE lymphedema. Pt is accommpanied by her mother today. Pt presents with LLE knee length compression wrap in place. Mother tells me she has not yet heard from DME vendor [place] custom compression garments ordered last week.  (Pended)     Patient is accompained by:  Family member  (Pended)     Pertinent History  CP, Hypothyroidism, Lennox-Gastaut Syndrome, Osteoporosis, hx pneumonia, profound MR, rickets, seizure discorder, R foot   (Pended)                    OT Treatments/Exercises (OP) - 08/29/17 0001      ADLs   ADL Education Given  Yes      Manual Therapy    Manual Therapy  Edema management;Compression Bandaging;Manual Lymphatic Drainage (MLD)    Edema Management  skin care to LLE w/ low ph [facility] (low ph, skin grade castor oil) during MLD to hydrate skin and improve skin flexibility    Manual Lymphatic Drainage (MLD)  MLD to LLE and RLE utilizing short neck sequence, deep abdominal lymphatics, and functional inguinal and politeal LN.    Compression Bandaging  12 cm Rosidal foam applied over cotton stockinet to tibial tuberosity, overlapping equally and w/ only enopugh tensioon to ensure close contour without wronkles and bunching.. 10 and 12 cm compression wraps applied inm graduated layers using gradient techniques. Stretch net over all.             OT Education - 08/29/17 0959    Education provided  Yes    Education Details  Continued skilled Pt/caregiver education  And LE ADL training throughout visit for lymphedema self care/ home program, including compression wrapping, compression garment and device wear/care, lymphatic pumping ther ex, simple self-MLD, and skin care. Discussed progress towards goals.    Person(s) Educated  Parent(s)    Methods  Explanation;Demonstration    Comprehension  Verbalized understanding;Returned demonstration  OT Long Term Goals - 08/22/17 1122      OT LONG TERM GOAL #1   Title  Pt to follow Lymphedema precautions and prevention strategies with maximum caregiver assistance to limit LE progression and further functional decline. (Primary caregiver modified independent w/ lymphedema precautions and prevention principals and strategies using printed reference to limit LE progression and infection risk.)    Baseline  Max A  08/22/2017: Parent/ Primary CG achieved.    Time  2    Period  Weeks    Status  Achieved      OT LONG TERM GOAL #2   Title  Lymphedema (LE) management/ self-care: Pt able to apply knee length, multi layered, gradient compression wraps, one leg at a time, with max  caregiver assist x 1 using proper techniques within 2 weeks to achieve optimal limb volume reduction in preparation for fitting compression garments/ devices.    Baseline  Dependent    08/22/2017: Parent/ Primary CG achieved. Independent    Time  2    Period  Weeks    Status  Achieved      OT LONG TERM GOAL #3   Title  Lymphedema (LE) management/ self-care:  Pt to achieve at least 10%  BLE limb volume reductions  during Intensive Phase CDT to limit LE progression, to reduce infection risk, and to improve functional mobility and transfers essential for optimal ADLs performance.    Baseline  Dependent  08/22/2017: achied for LLE w/ 12% thus far.    Time  12    Period  Weeks    Status  Partially Met      OT LONG TERM GOAL #4   Title  Lymphedema (LE) management/ self-care:  Pt to tolerate daily compression wraps, compression garments and/ or HOS devices in keeping w/ prescribed wear regimes within 1 week of issue date of each to progress and retain clinical and functional gains and to limit LE progression.    Baseline  Max A 08/22/2017: Partially met. Pt tolerated LLE compression wraps without difficulty. LLE custom day and night time compression garments are ordered. awaiting Medicaid preauthorization.    Time  12    Period  Weeks    Status  New      OT LONG TERM GOAL #5   Title  Lymphedema (LE) management/ self-care:  During Management Phase CDT Pt to sustain current limb volumes within 3%, and all other clinical gains achieved during OT treatment iwith maximum caregiver assistance  (to control limb swelling and associated pain/ discomfort, to  limit LE progression, to decrease infection risk and to limit further functional decline.    Baseline  dependent    Time  6    Period  Months    Status  On-going            Plan - 08/29/17 1000    Clinical Impression Statement  Provided manual lymphatic drainage , skin care, and comnpression therapy to LLE in keeping w/ CDT Intensive protocol.  Awaiting LLE custom garment delivery for fitting. Pt continues to demonstrate progress towards all goals. Her parent is supportive and diligent with home program. Cont as per POC.    Occupational Profile and client history currently impacting functional performance  intractible seizures, CP, intelectual delay, quadriparesis, dependent on others for all besic and instrumental ADLs    Occupational performance deficits (Please refer to evaluation for details):  ADL's;IADL's;Work;Play;Leisure;Social Participation;Rest and Sleep;Education    Rehab Potential  Good    Current Impairments/barriers  affecting progress:  impaired intellectual delay and quadraparesis    OT Frequency  3x / week    OT Treatment/Interventions  Self-care/ADL training;DME and/or AE instruction;Manual Therapy;Patient/family education;Manual lymph drainage;Compression bandaging;Therapeutic activities;Other (comment) BLE skin care during MLD with low ph Eucerin lotion and Palma Christii, skin grade castor oil.    Clinical Decision Making  Multiple treatment options, significant modification of task necessary    Recommended Other Services  Fit with appropriate, knee length, BLE compression garments and or devices- For daytime consider custom, flat knit, open toe, Jobst Elvarex elastic classic stockings  And /Or adjustable, Velcro close, CircAid, short stretch leg and foot piece for ease of donning and doffing. Fit w/ HOS devices, ( consider Jobst, ccl 2 Relax) to limit fibrosis formation and improve lymphatic transport during HOS.       Patient will benefit from skilled therapeutic intervention in order to improve the following deficits and impairments:  Decreased knowledge of use of DME, Decreased skin integrity, Increased edema, Decreased knowledge of precautions, Decreased safety awareness, Difficulty walking, Obesity, Decreased balance  Visit Diagnosis: Lymphedema, not elsewhere classified    Problem List Patient Active Problem  List   Diagnosis Date Noted  . Lennox-Gastaut syndrome ([place]) 07/06/2017  . Acute decrease in level of consciousness 06/29/2017  . Lymphedema 01/22/2017  . HCAP (healthcare-associated pneumonia) 12/06/2016  . Arterial hypotension   . Sepsis ([place]) 11/04/2016  . Other constipation   . Bilateral lower extremity edema 04/21/2016  . Unsteadiness on feet   . Community acquired pneumonia of left lower lobe of lung ([place]) 04/05/2016  . Cough 04/03/2016  . Rhinitis, non-allergic 06/23/2015  . Special screening examination for human papillomavirus (HPV) 07/20/2014  . Amenorrhea 05/19/2014  . 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 Long-term use of high-risk medication 12/24/2012  . Morbid obesity ([place]) 12/24/2012  . Congenital quadriplegia ([place]) 12/24/2012  . URINARY INCONTINENCE 02/25/2010  . Full incontinence of feces 02/25/2010  . DIGESTIVE SYSTEM COMPLICATION NEC 94/49/6759  . Hypothyroidism 01/29/2009  . 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 Infantile cerebral palsy ([place]) 01/29/2009  . GASTROESOPHAGEAL REFLUX DISEASE 01/29/2009  . Osteoporosis 01/29/2009  . Seizure disorder ([place]) 01/29/2009    Andrey Spearman, MS, OTR/L, [facility] 08/29/17 10:02 AM   [place] MAIN [facility] SERVICES [address] [place], [place], [address] Phone: [phone]   Fax:  [phone]  Name: Michele [name] MRN: [id number] Date of Birth: 09/30/1985

## 2017-08-30 ENCOUNTER — Telehealth (INDEPENDENT_AMBULATORY_CARE_PROVIDER_SITE_OTHER): Payer: Self-pay | Admitting: Family

## 2017-08-30 ENCOUNTER — Telehealth: Payer: Self-pay | Admitting: Family

## 2017-08-30 NOTE — Telephone Encounter (Signed)
Spoke with D[place] at [facility] to initiate prior authorization. Two of the five medications, Depakote and Carnitor, have been approved but we are waiting for the approval for Topamax, Keppra, and Banzel.  The pharmacy has been trying to get a 34 day refill instead of a 30 day refill so insurance will not pay unless it is approved.   Mom did not make us aware that they changed the patient insurance from Medicaid to [employer] [facility] [employer] so this is the reason it is taking so long to get the refills. I will update mom once the PA's have been approved.

## 2017-08-30 NOTE — Telephone Encounter (Signed)
Who's calling (name and relationship to patient) : [name] (mom)  Best contact number: [phone]  Provider they see: Elveria Risingina Goodpasture   Reason for call: Patients mother is requesting that we call the insurance [phone]([id number]) and give medical necessity information for the medications below.    PRESCRIPTION REFILL ONLY  Name of prescription: Kepra                                        Tompamax                                        Depakote                                          Banzel

## 2017-08-30 NOTE — Telephone Encounter (Signed)
°  Who's calling (name and relationship to patient) : Jonny RuizJohn Scientist, research (physical sciences)(pharmacist)  Best contact number: [phone]#[id number]  Provider they see: Inetta Fermoina  Reason for call: Representative wants to know is it okay if the patient is given generic brand of medication below and disregards to the prior authorization    Name of prescription: CARNITOR 1 GM/10ML solution

## 2017-08-30 NOTE — Telephone Encounter (Signed)
°  Who's calling (name and relationship to patient) : Mindi JunkerMarsha Scientist, research (physical sciences)(Pharmacist from [facility]) Best contact number: [phone] case#: [id number] Provider they see: Inetta Fermoina  Reason for call: Mindi JunkerMarsha called in reference to a PA request for non-formulary carnitor solution. She had two questions she needed answered for clarification.

## 2017-08-31 NOTE — Telephone Encounter (Signed)
Forms have been placed on [name]'s desk to be filled out and handled Monday

## 2017-08-31 NOTE — Telephone Encounter (Signed)
Received a "Coverage Determination Request" form from [facility] for medication ( Depakote). They are requesting that provider fills out form and attaches any pertinent medical history or information for this patient that may support approval. After document has been complete please fax to [phone].  *Placed document in providers basket up front*

## 2017-09-03 ENCOUNTER — Ambulatory Visit: Payer: Medicare Other | Admitting: Occupational Therapy

## 2017-09-04 ENCOUNTER — Ambulatory Visit: Payer: Medicare Other | Admitting: Occupational Therapy

## 2017-09-04 ENCOUNTER — Other Ambulatory Visit (INDEPENDENT_AMBULATORY_CARE_PROVIDER_SITE_OTHER): Payer: Self-pay | Admitting: Family

## 2017-09-04 DIAGNOSIS — G40319 Generalized idiopathic epilepsy and epileptic syndromes, intractable, without status epilepticus: Secondary | ICD-10-CM

## 2017-09-04 DIAGNOSIS — I890 Lymphedema, not elsewhere classified: Secondary | ICD-10-CM | POA: Diagnosis not present

## 2017-09-04 MED ORDER — LEVOCARNITINE 1 GM/10ML PO SOLN: ORAL | 5 refills | Status: DC

## 2017-09-04 NOTE — Therapy (Signed)
[place] MAIN [facility] SERVICES [address] [place], [place], [address] Phone: [phone]   Fax:  [phone]  Occupational Therapy Treatment  Patient Details  Name: Michele [name] MRN: [id number] Date of Birth: [birth year] Referring Provider: Everrett Coombe, MD   Encounter Date: 09/04/2017  OT End of Session - 09/04/17 1108    Visit Number  13    Number of Visits  36    Date for OT Re-Evaluation  10/11/17    OT Start Time  0906    OT Stop Time  0954    OT Time Calculation (min)  48 min    Activity Tolerance  Patient tolerated treatment well;No increased pain;Other (comment) limited by impaired    mobility, ambulation , language limitations and significant intellectual delay    Behavior During Therapy  [facility] for tasks assessed/performed       Past Medical History:  Diagnosis Date  . Cerebral palsy ([place])   . Hypothyroidism   . Lennox-Gastaut syndrome ([place])   . Osteoporosis   . Pneumonia   . Profound mental retardation   . Reflux   . Rickets   . Seizures ([place])     Past Surgical History:  Procedure Laterality Date  . FOOT MASS EXCISION Right   . Gastrocutaneous fistual repair  2012  . PEG TUBE PLACEMENT      There were no vitals filed for this visit.  Subjective Assessment - 09/04/17 1108    Subjective   [name] presents for OT visit 13/ 36 for CDT to address BLE lymphedema. Pt is accommpanied by her mother today. Pt presents with LLE knee length compression wrap in place. Mother reports last visit cx due to illness. DME vendor notified OT after visit ended that Medicaid preauthorization for compression garments is complete and garments have been ordered.    Patient is accompained by:  Family member    Pertinent History  CP, Hypothyroidism, Lennox-Gastaut Syndrome, Osteoporosis, hx pneumonia, profound MR, rickets, seizure discorder, R foot           LYMPHEDEMA/ONCOLOGY QUESTIONNAIRE - 09/04/17 1103      Left Lower Extremity  Lymphedema   Other  LLE ankle-to popliteal (A-D) limb volume measures 2703.71 ml.    Other  LLE is increased in volume by 6.1% since last measured on 08/15/2017.  LLE A-D volume is decreased by 7.2% overall since commencing OT for CDT on 2/11.              OT Treatments/Exercises (OP) - 09/04/17 0001      ADLs   ADL Education Given  Yes      Manual Therapy   Manual Therapy  Edema management;Compression Bandaging;Manual Lymphatic Drainage (MLD)    Manual therapy comments  LLE comparative limb volumetrics    Edema Management  skin care to LLE w/ low ph [facility] (low ph, skin grade castor oil) during MLD to hydrate skin and improve skin flexibility    Manual Lymphatic Drainage (MLD)  MLD to LLE and RLE utilizing short neck sequence, deep abdominal lymphatics, and functional inguinal and politeal LN.    Compression Bandaging  12 cm Rosidal foam applied over cotton stockinet to tibial tuberosity, overlapping equally and w/ only enopugh tensioon to ensure close contour without wronkles and bunching.. 10 and 12 cm compression wraps applied inm graduated layers using gradient techniques. Stretch net over all.             OT Education - 09/04/17 1106  Education provided  Yes    Education Details  Scientist, research (life sciences) edu for LE self care throughout session. Reviewed results of LLE comparative volumetrics. Discussed fluctuation in limb volume, dietary and positional impact on volume, and discuyssed progress towards LTG.    Person(s) Educated  Parent(s)    Methods  Explanation;Demonstration    Comprehension  Verbalized understanding;Returned demonstration          OT Long Term Goals - 08/22/17 1122      OT LONG TERM GOAL #1   Title  Pt to follow Lymphedema precautions and prevention strategies with maximum caregiver assistance to limit LE progression and further functional decline. (Primary caregiver modified independent w/ lymphedema precautions and prevention principals and  strategies using printed reference to limit LE progression and infection risk.)    Baseline  Max A  08/22/2017: Parent/ Primary CG achieved.    Time  2    Period  Weeks    Status  Achieved      OT LONG TERM GOAL #2   Title  Lymphedema (LE) management/ self-care: Pt able to apply knee length, multi layered, gradient compression wraps, one leg at a time, with max caregiver assist x 1 using proper techniques within 2 weeks to achieve optimal limb volume reduction in preparation for fitting compression garments/ devices.    Baseline  Dependent    08/22/2017: Parent/ Primary CG achieved. Independent    Time  2    Period  Weeks    Status  Achieved      OT LONG TERM GOAL #3   Title  Lymphedema (LE) management/ self-care:  Pt to achieve at least 10%  BLE limb volume reductions  during Intensive Phase CDT to limit LE progression, to reduce infection risk, and to improve functional mobility and transfers essential for optimal ADLs performance.    Baseline  Dependent  08/22/2017: achied for LLE w/ 12% thus far.    Time  12    Period  Weeks    Status  Partially Met      OT LONG TERM GOAL #4   Title  Lymphedema (LE) management/ self-care:  Pt to tolerate daily compression wraps, compression garments and/ or HOS devices in keeping w/ prescribed wear regimes within 1 week of issue date of each to progress and retain clinical and functional gains and to limit LE progression.    Baseline  Max A 08/22/2017: Partially met. Pt tolerated LLE compression wraps without difficulty. LLE custom day and night time compression garments are ordered. awaiting Medicaid preauthorization.    Time  12    Period  Weeks    Status  New      OT LONG TERM GOAL #5   Title  Lymphedema (LE) management/ self-care:  During Management Phase CDT Pt to sustain current limb volumes within 3%, and all other clinical gains achieved during OT treatment iwith maximum caregiver assistance  (to control limb swelling and associated pain/ discomfort,  to  limit LE progression, to decrease infection risk and to limit further functional decline.    Baseline  dependent    Time  6    Period  Months    Status  On-going            Plan - 09/04/17 1109    Clinical Impression Statement  Completed LLE comparative limb volumetrics. LLE is INcreased in volume by 6.1% since last measured on 08/15/2017, and  decreased by 7.2% overall since commencing OT for CDT on 2/11. Fluctuation may  be indicative of clinical plateau, decreased compliance with comptession wraps between visits, and / or  systemic fluid retention.  Despite this increase, Pt continuesto retain 7% reduction as clinical gain. Pt tolerated MLD and compression wrapping to LLE without difficulty . Cont as per POC.    Occupational Profile and client history currently impacting functional performance  intractible seizures, CP, intelectual delay, quadriparesis, dependent on others for all besic and instrumental ADLs    Occupational performance deficits (Please refer to evaluation for details):  ADL's;IADL's;Work;Play;Leisure;Social Participation;Rest and Sleep;Education    Rehab Potential  Good    Current Impairments/barriers affecting progress:  impaired intellectual delay and quadraparesis    OT Frequency  3x / week    OT Treatment/Interventions  Self-care/ADL training;DME and/or AE instruction;Manual Therapy;Patient/family education;Manual lymph drainage;Compression bandaging;Therapeutic activities;Other (comment) BLE skin care during MLD with low ph Eucerin lotion and [place], skin grade castor oil.    Clinical Decision Making  Multiple treatment options, significant modification of task necessary    Recommended Other Services  Fit with appropriate, knee length, BLE compression garments and or devices- For daytime consider custom, flat knit, open toe, Jobst Elvarex elastic classic stockings  And /Or adjustable, Velcro close, CircAid, short stretch leg and foot piece for ease of donning  and doffing. Fit w/ HOS devices, ( consider Jobst, ccl 2 Relax) to limit fibrosis formation and improve lymphatic transport during HOS.       Patient will benefit from skilled therapeutic intervention in order to improve the following deficits and impairments:  Decreased knowledge of use of DME, Decreased skin integrity, Increased edema, Decreased knowledge of precautions, Decreased safety awareness, Difficulty walking, Obesity, Decreased balance  Visit Diagnosis: Lymphedema, not elsewhere classified    Problem List Patient Active Problem List   Diagnosis Date Noted  . Lennox-Gastaut syndrome ([place]) 07/06/2017  . Acute decrease in level of consciousness 06/29/2017  . Lymphedema 01/22/2017  . HCAP (healthcare-associated pneumonia) 12/06/2016  . Arterial hypotension   . Sepsis ([place]) 11/04/2016  . Other constipation   . Bilateral lower extremity edema 04/21/2016  . Unsteadiness on feet   . Community acquired pneumonia of left lower lobe of lung ([place]) 04/05/2016  . Cough 04/03/2016  . Rhinitis, non-allergic 06/23/2015  . Special screening examination for human papillomavirus (HPV) 07/20/2014  . Amenorrhea 05/19/2014  . 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 Long-term use of high-risk medication 12/24/2012  . Morbid obesity ([place]) 12/24/2012  . Congenital quadriplegia ([place]) 12/24/2012  . URINARY INCONTINENCE 02/25/2010  . Full incontinence of feces 02/25/2010  . DIGESTIVE SYSTEM COMPLICATION NEC 73/42/8768  . Hypothyroidism 01/29/2009  . 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 Infantile cerebral palsy ([place]) 01/29/2009  . GASTROESOPHAGEAL REFLUX DISEASE 01/29/2009  . Osteoporosis 01/29/2009  . Seizure disorder ([place]) 01/29/2009    Andrey Spearman, MS, OTR/L, [facility] 09/04/17 11:19 AM  [place] MAIN [facility] SERVICES [address] [place], [place], [address] Phone: [phone]   Fax:   [phone]  Name: Michele [name] MRN: [id number] Date of Birth: 25-May-1986

## 2017-09-05 ENCOUNTER — Ambulatory Visit: Payer: Medicare Other | Admitting: Occupational Therapy

## 2017-09-05 DIAGNOSIS — I890 Lymphedema, not elsewhere classified: Secondary | ICD-10-CM

## 2017-09-05 NOTE — Therapy (Signed)
[place] MAIN [facility] SERVICES [address] [place], [place], [address] Phone: [phone]   Fax:  [phone]  Occupational Therapy Treatment  Patient Details  Name: Michele [name] MRN: [id number] Date of Birth: [birth year] Referring Provider: Everrett Coombe, MD   Encounter Date: 09/05/2017  OT End of Session - 09/05/17 1220    Visit Number  14    Number of Visits  36    Date for OT Re-Evaluation  10/11/17    OT Start Time  0830    OT Stop Time  0845    OT Time Calculation (min)  15 min    Activity Tolerance  Patient tolerated treatment well;No increased pain;Other (comment) limited by impaired    mobility, ambulation , language limitations and significant intellectual delay    Behavior During Therapy  [facility] for tasks assessed/performed       Past Medical History:  Diagnosis Date  . Cerebral palsy ([place])   . Hypothyroidism   . Lennox-Gastaut syndrome ([place])   . Osteoporosis   . Pneumonia   . Profound mental retardation   . Reflux   . Rickets   . Seizures ([place])     Past Surgical History:  Procedure Laterality Date  . FOOT MASS EXCISION Right   . Gastrocutaneous fistual repair  2012  . PEG TUBE PLACEMENT      There were no vitals filed for this visit.  Subjective Assessment - 09/05/17 1219    Subjective   Michele [name] presents for OT visit 14/ 36 for CDT to address BLE lymphedema. Pt is accommpanied by her mother today. Pt arrives for unscheduled appointment. Parent in agreement with abbreviated session to remove nd reapply compression wraps.    Patient is accompained by:  Family member    Pertinent History  CP, Hypothyroidism, Lennox-Gastaut Syndrome, Osteoporosis, hx pneumonia, profound MR, rickets, seizure discorder, R foot     Currently in Pain?  No/denies          LYMPHEDEMA/ONCOLOGY QUESTIONNAIRE - 09/04/17 1103      Left Lower Extremity Lymphedema   Other  LLE ankle-to popliteal (A-D) limb volume measures 2703.71 ml.    Other   LLE is increased in volume by 6.1% since last measured on 08/15/2017.  LLE A-D volume is decreased by 7.2% overall since commencing OT for CDT on 2/11.              OT Treatments/Exercises (OP) - 09/05/17 0001      Manual Therapy   Manual Therapy  Edema management;Compression Bandaging    Edema Management  skin care to LLE w/ low ph [facility] (low ph, skin grade castor oil) during MLD to hydrate skin and improve skin flexibility    Compression Bandaging  12 cm Rosidal foam applied over cotton stockinet to tibial tuberosity, overlapping equally and w/ only enopugh tensioon to ensure close contour without wronkles and bunching.. 10 and 12 cm compression wraps applied inm graduated layers using gradient techniques. Stretch net over all.             OT Education - 09/05/17 1220    Education provided  No          OT Long Term Goals - 08/22/17 1122      OT LONG TERM GOAL #1   Title  Pt to follow Lymphedema precautions and prevention strategies with maximum caregiver assistance to limit LE progression and further functional decline. (Primary caregiver modified independent w/ lymphedema precautions and prevention principals  and strategies using printed reference to limit LE progression and infection risk.)    Baseline  Max A  08/22/2017: Parent/ Primary CG achieved.    Time  2    Period  Weeks    Status  Achieved      OT LONG TERM GOAL #2   Title  Lymphedema (LE) management/ self-care: Pt able to apply knee length, multi layered, gradient compression wraps, one leg at a time, with max caregiver assist x 1 using proper techniques within 2 weeks to achieve optimal limb volume reduction in preparation for fitting compression garments/ devices.    Baseline  Dependent    08/22/2017: Parent/ Primary CG achieved. Independent    Time  2    Period  Weeks    Status  Achieved      OT LONG TERM GOAL #3   Title  Lymphedema (LE) management/ self-care:  Pt to achieve at least 10%  BLE limb  volume reductions  during Intensive Phase CDT to limit LE progression, to reduce infection risk, and to improve functional mobility and transfers essential for optimal ADLs performance.    Baseline  Dependent  08/22/2017: achied for LLE w/ 12% thus far.    Time  12    Period  Weeks    Status  Partially Met      OT LONG TERM GOAL #4   Title  Lymphedema (LE) management/ self-care:  Pt to tolerate daily compression wraps, compression garments and/ or HOS devices in keeping w/ prescribed wear regimes within 1 week of issue date of each to progress and retain clinical and functional gains and to limit LE progression.    Baseline  Max A 08/22/2017: Partially met. Pt tolerated LLE compression wraps without difficulty. LLE custom day and night time compression garments are ordered. awaiting Medicaid preauthorization.    Time  12    Period  Weeks    Status  New      OT LONG TERM GOAL #5   Title  Lymphedema (LE) management/ self-care:  During Management Phase CDT Pt to sustain current limb volumes within 3%, and all other clinical gains achieved during OT treatment iwith maximum caregiver assistance  (to control limb swelling and associated pain/ discomfort, to  limit LE progression, to decrease infection risk and to limit further functional decline.    Baseline  dependent    Time  6    Period  Months    Status  On-going            Plan - 09/05/17 1223    Clinical Impression Statement  Pt arrived for unscheduled appointmnent. Removed LLE wraps, inspected skin, applied  low ph Eucerin lotion, and reapplied gradient wraps. Good abbreviated session.    Occupational Profile and client history currently impacting functional performance  intractible seizures, CP, intelectual delay, quadriparesis, dependent on others for all besic and instrumental ADLs    Occupational performance deficits (Please refer to evaluation for details):  ADL's;IADL's;Work;Play;Leisure;Social Participation;Rest and Sleep;Education     Rehab Potential  Good    Current Impairments/barriers affecting progress:  impaired intellectual delay and quadraparesis    OT Frequency  3x / week    OT Treatment/Interventions  Self-care/ADL training;DME and/or AE instruction;Manual Therapy;Patient/family education;Manual lymph drainage;Compression bandaging;Therapeutic activities;Other (comment) BLE skin care during MLD with low ph Eucerin lotion and Palma Christii, skin grade castor oil.    Clinical Decision Making  Multiple treatment options, significant modification of task necessary    Recommended Other Services  Fit with appropriate, knee length, BLE compression garments and or devices- For daytime consider custom, flat knit, open toe, Jobst Elvarex elastic classic stockings  And /Or adjustable, Velcro close, CircAid, short stretch leg and foot piece for ease of donning and doffing. Fit w/ HOS devices, ( consider Jobst, ccl 2 Relax) to limit fibrosis formation and improve lymphatic transport during HOS.       Patient will benefit from skilled therapeutic intervention in order to improve the following deficits and impairments:  Decreased knowledge of use of DME, Decreased skin integrity, Increased edema, Decreased knowledge of precautions, Decreased safety awareness, Difficulty walking, Obesity, Decreased balance  Visit Diagnosis: Lymphedema, not elsewhere classified    Problem List Patient Active Problem List   Diagnosis Date Noted  . Lennox-Gastaut syndrome ([place]) 07/06/2017  . Acute decrease in level of consciousness 06/29/2017  . Lymphedema 01/22/2017  . HCAP (healthcare-associated pneumonia) 12/06/2016  . Arterial hypotension   . Sepsis ([place]) 11/04/2016  . Other constipation   . Bilateral lower extremity edema 04/21/2016  . Unsteadiness on feet   . Community acquired pneumonia of left lower lobe of lung ([place]) 04/05/2016  . Cough 04/03/2016  . Rhinitis, non-allergic 06/23/2015  . Special screening examination for human  papillomavirus (HPV) 07/20/2014  . Amenorrhea 05/19/2014  . 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 Long-term use of high-risk medication 12/24/2012  . Morbid obesity ([place]) 12/24/2012  . Congenital quadriplegia ([place]) 12/24/2012  . URINARY INCONTINENCE 02/25/2010  . Full incontinence of feces 02/25/2010  . DIGESTIVE SYSTEM COMPLICATION NEC 01/07/8287  . Hypothyroidism 01/29/2009  . 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 Infantile cerebral palsy ([place]) 01/29/2009  . GASTROESOPHAGEAL REFLUX DISEASE 01/29/2009  . Osteoporosis 01/29/2009  . Seizure disorder ([place]) 01/29/2009    Andrey Spearman, MS, OTR/L, [facility] 09/05/17 12:25 PM  [place] MAIN [facility] SERVICES [address] [place], [place], [address] Phone: [phone]   Fax:  [phone]  Name: KOBE JANSMA MRN: [id number] Date of Birth: [birth year]

## 2017-09-06 ENCOUNTER — Encounter: Payer: Medicare Other | Admitting: Occupational Therapy

## 2017-09-10 ENCOUNTER — Ambulatory Visit: Payer: Medicare Other | Admitting: Occupational Therapy

## 2017-09-10 DIAGNOSIS — I890 Lymphedema, not elsewhere classified: Secondary | ICD-10-CM | POA: Diagnosis not present

## 2017-09-10 NOTE — Therapy (Signed)
[place] MAIN [facility] SERVICES [address] [place], [place], [address] Phone: [phone]   Fax:  [phone]  Occupational Therapy Treatment  Patient Details  Name: Michele [name] MRN: [id number] Date of Birth: Apr 16, 1986 Referring Provider: Everrett Coombe, MD   Encounter Date: 09/10/2017  OT End of Session - 09/10/17 1159    Visit Number  15    Number of Visits  36    Date for OT Re-Evaluation  10/11/17    OT Start Time  0904    OT Stop Time  0954    OT Time Calculation (min)  50 min    Activity Tolerance  Patient tolerated treatment well;No increased pain;Other (comment) limited by impaired    mobility, ambulation , language limitations and significant intellectual delay    Behavior During Therapy  [facility] for tasks assessed/performed       Past Medical History:  Diagnosis Date  . Cerebral palsy ([place])   . Hypothyroidism   . Lennox-Gastaut syndrome ([place])   . Osteoporosis   . Pneumonia   . Profound mental retardation   . Reflux   . Rickets   . Seizures ([place])     Past Surgical History:  Procedure Laterality Date  . FOOT MASS EXCISION Right   . Gastrocutaneous fistual repair  2012  . PEG TUBE PLACEMENT      There were no vitals filed for this visit.  Subjective Assessment - 09/10/17 1157    Subjective   Michele [name] presents for OT visit 15/ 36 for CDT to address BLE lymphedema. Pt is accommpanied by her mother today. Pt presents w/ compression wraps in place.    Patient is accompained by:  Family member    Pertinent History  CP, Hypothyroidism, Lennox-Gastaut Syndrome, Osteoporosis, hx pneumonia, profound MR, rickets, seizure discorder, R foot     Currently in Pain?  No/denies                   OT Treatments/Exercises (OP) - 09/10/17 0001      ADLs   ADL Education Given  Yes      Manual Therapy   Manual Therapy  Edema management;Compression Bandaging;Manual Lymphatic Drainage (MLD)    Edema Management  skin care to LLE  w/ low ph [facility] (low ph, skin grade castor oil) during MLD to hydrate skin and improve skin flexibility    Manual Lymphatic Drainage (MLD)  MLD to LLE and RLE utilizing short neck sequence, deep abdominal lymphatics, and functional inguinal and politeal LN.    Compression Bandaging  12 cm Rosidal foam applied over cotton stockinet to tibial tuberosity, overlapping equally and w/ only enopugh tensioon to ensure close contour without wronkles and bunching.. 10 and 12 cm compression wraps applied inm graduated layers using gradient techniques. Stretch net over all.             OT Education - 09/10/17 1159    Education provided  No    Education Details  Cont w/elf care edu throughout session.    Person(s) Educated  Patient;Parent(s)    Methods  Explanation;Demonstration    Comprehension  Verbalized understanding;Returned demonstration          OT Long Term Goals - 08/22/17 1122      OT LONG TERM GOAL #1   Title  Pt to follow Lymphedema precautions and prevention strategies with maximum caregiver assistance to limit LE progression and further functional decline. (Primary caregiver modified independent w/ lymphedema precautions and prevention  principals and strategies using printed reference to limit LE progression and infection risk.)    Baseline  Max A  08/22/2017: Parent/ Primary CG achieved.    Time  2    Period  Weeks    Status  Achieved      OT LONG TERM GOAL #2   Title  Lymphedema (LE) management/ self-care: Pt able to apply knee length, multi layered, gradient compression wraps, one leg at a time, with max caregiver assist x 1 using proper techniques within 2 weeks to achieve optimal limb volume reduction in preparation for fitting compression garments/ devices.    Baseline  Dependent    08/22/2017: Parent/ Primary CG achieved. Independent    Time  2    Period  Weeks    Status  Achieved      OT LONG TERM GOAL #3   Title  Lymphedema (LE) management/ self-care:  Pt to  achieve at least 10%  BLE limb volume reductions  during Intensive Phase CDT to limit LE progression, to reduce infection risk, and to improve functional mobility and transfers essential for optimal ADLs performance.    Baseline  Dependent  08/22/2017: achied for LLE w/ 12% thus far.    Time  12    Period  Weeks    Status  Partially Met      OT LONG TERM GOAL #4   Title  Lymphedema (LE) management/ self-care:  Pt to tolerate daily compression wraps, compression garments and/ or HOS devices in keeping w/ prescribed wear regimes within 1 week of issue date of each to progress and retain clinical and functional gains and to limit LE progression.    Baseline  Max A 08/22/2017: Partially met. Pt tolerated LLE compression wraps without difficulty. LLE custom day and night time compression garments are ordered. awaiting Medicaid preauthorization.    Time  12    Period  Weeks    Status  New      OT LONG TERM GOAL #5   Title  Lymphedema (LE) management/ self-care:  During Management Phase CDT Pt to sustain current limb volumes within 3%, and all other clinical gains achieved during OT treatment iwith maximum caregiver assistance  (to control limb swelling and associated pain/ discomfort, to  limit LE progression, to decrease infection risk and to limit further functional decline.    Baseline  dependent    Time  6    Period  Months    Status  On-going            Plan - 09/10/17 1200    Clinical Impression Statement  Pt tolerated all aspects of manual therapy for LE care today. Swelling is well controlled in LLE and appears to have reached a clinical plateau in terms of swelling reduction. Parent in agreement with plan to decrease visit frequency from 3 x to 2 x weekly while awaiting custom LLE garment fitting.     Occupational Profile and client history currently impacting functional performance  intractible seizures, CP, intelectual delay, quadriparesis, dependent on others for all besic and  instrumental ADLs    Occupational performance deficits (Please refer to evaluation for details):  ADL's;IADL's;Work;Play;Leisure;Social Participation;Rest and Sleep;Education    Rehab Potential  Good    Current Impairments/barriers affecting progress:  impaired intellectual delay and quadraparesis    OT Frequency  3x / week    OT Treatment/Interventions  Self-care/ADL training;DME and/or AE instruction;Manual Therapy;Patient/family education;Manual lymph drainage;Compression bandaging;Therapeutic activities;Other (comment) BLE skin care during MLD with low ph  Eucerin lotion and Palma Christii, skin grade castor oil.    Clinical Decision Making  Multiple treatment options, significant modification of task necessary    Recommended Other Services  Fit with appropriate, knee length, BLE compression garments and or devices- For daytime consider custom, flat knit, open toe, Jobst Elvarex elastic classic stockings  And /Or adjustable, Velcro close, CircAid, short stretch leg and foot piece for ease of donning and doffing. Fit w/ HOS devices, ( consider Jobst, ccl 2 Relax) to limit fibrosis formation and improve lymphatic transport during HOS.       Patient will benefit from skilled therapeutic intervention in order to improve the following deficits and impairments:  Decreased knowledge of use of DME, Decreased skin integrity, Increased edema, Decreased knowledge of precautions, Decreased safety awareness, Difficulty walking, Obesity, Decreased balance  Visit Diagnosis: Lymphedema, not elsewhere classified    Problem List Patient Active Problem List   Diagnosis Date Noted  . Lennox-Gastaut syndrome ([place]) 07/06/2017  . Acute decrease in level of consciousness 06/29/2017  . Lymphedema 01/22/2017  . HCAP (healthcare-associated pneumonia) 12/06/2016  . Arterial hypotension   . Sepsis ([place]) 11/04/2016  . Other constipation   . Bilateral lower extremity edema 04/21/2016  . Unsteadiness on feet   .  Community acquired pneumonia of left lower lobe of lung ([place]) 04/05/2016  . Cough 04/03/2016  . Rhinitis, non-allergic 06/23/2015  . Special screening examination for human papillomavirus (HPV) 07/20/2014  . Amenorrhea 05/19/2014  . 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 Long-term use of high-risk medication 12/24/2012  . Morbid obesity ([place]) 12/24/2012  . Congenital quadriplegia ([place]) 12/24/2012  . URINARY INCONTINENCE 02/25/2010  . Full incontinence of feces 02/25/2010  . DIGESTIVE SYSTEM COMPLICATION NEC 50/08/7046  . Hypothyroidism 01/29/2009  . 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 Infantile cerebral palsy ([place]) 01/29/2009  . GASTROESOPHAGEAL REFLUX DISEASE 01/29/2009  . Osteoporosis 01/29/2009  . Seizure disorder ([employer]) 01/29/2009    Andrey Spearman, MS, OTR/L, [facility] 09/10/17 12:03 PM  [place] MAIN [facility] SERVICES [address] [place], [place], [address] Phone: [phone]   Fax:  [phone]  Name: Michele [name] MRN: [id number] Date of Birth: 06/11/1986

## 2017-09-11 ENCOUNTER — Other Ambulatory Visit: Payer: Self-pay | Admitting: Student in an Organized Health Care Education/Training Program

## 2017-09-11 ENCOUNTER — Ambulatory Visit: Payer: Medicare Other | Admitting: Occupational Therapy

## 2017-09-11 DIAGNOSIS — R600 Localized edema: Secondary | ICD-10-CM

## 2017-09-12 IMAGING — CR DG CHEST 2V
2 series · 2 of 2 positions shown · non-contrast
Comparison: Chest radiograph performed 11/03/2016

CLINICAL DATA: Acute onset of cough and fever.  Initial encounter.

EXAM:
CHEST  2 VIEW

[w chest lat]
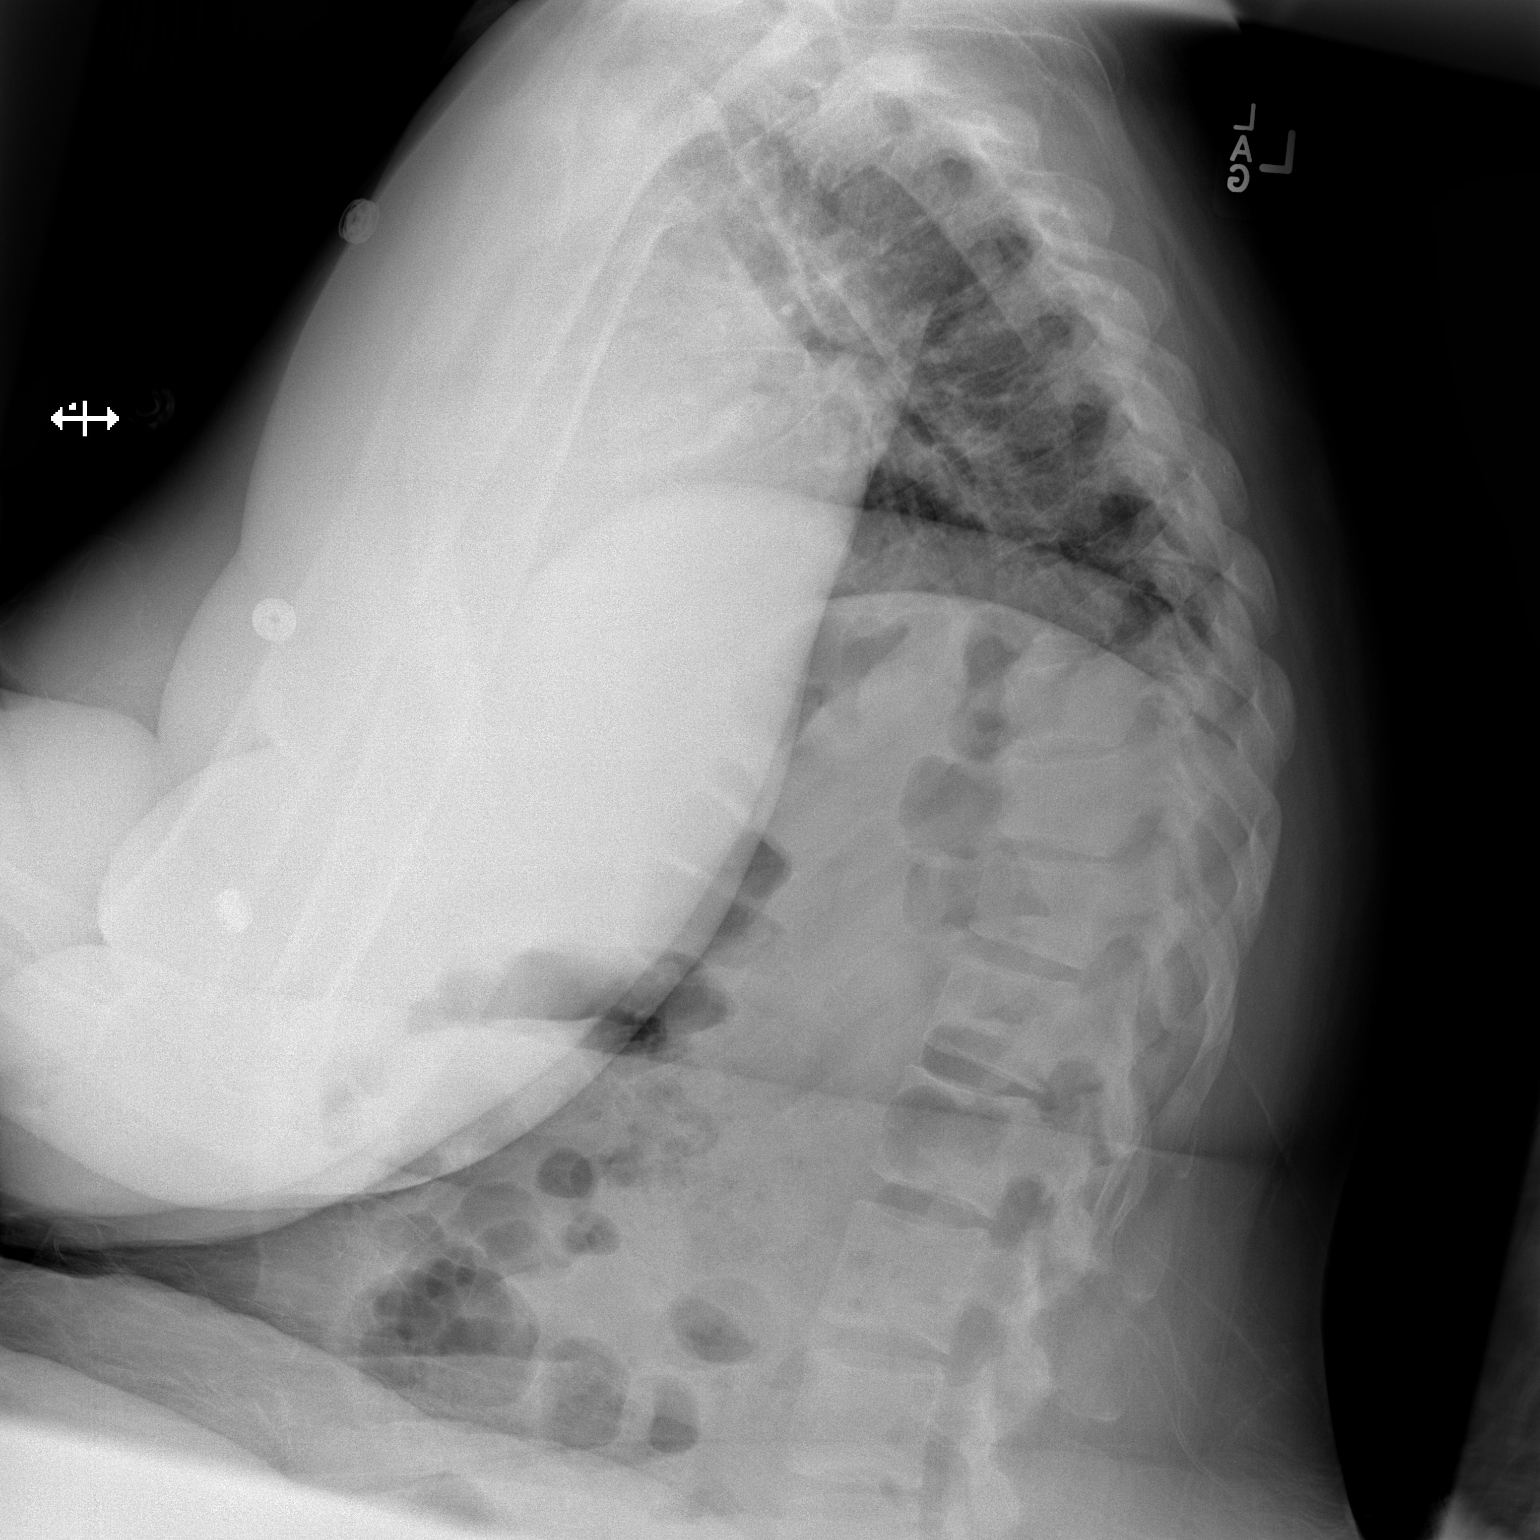

[x chest ap]
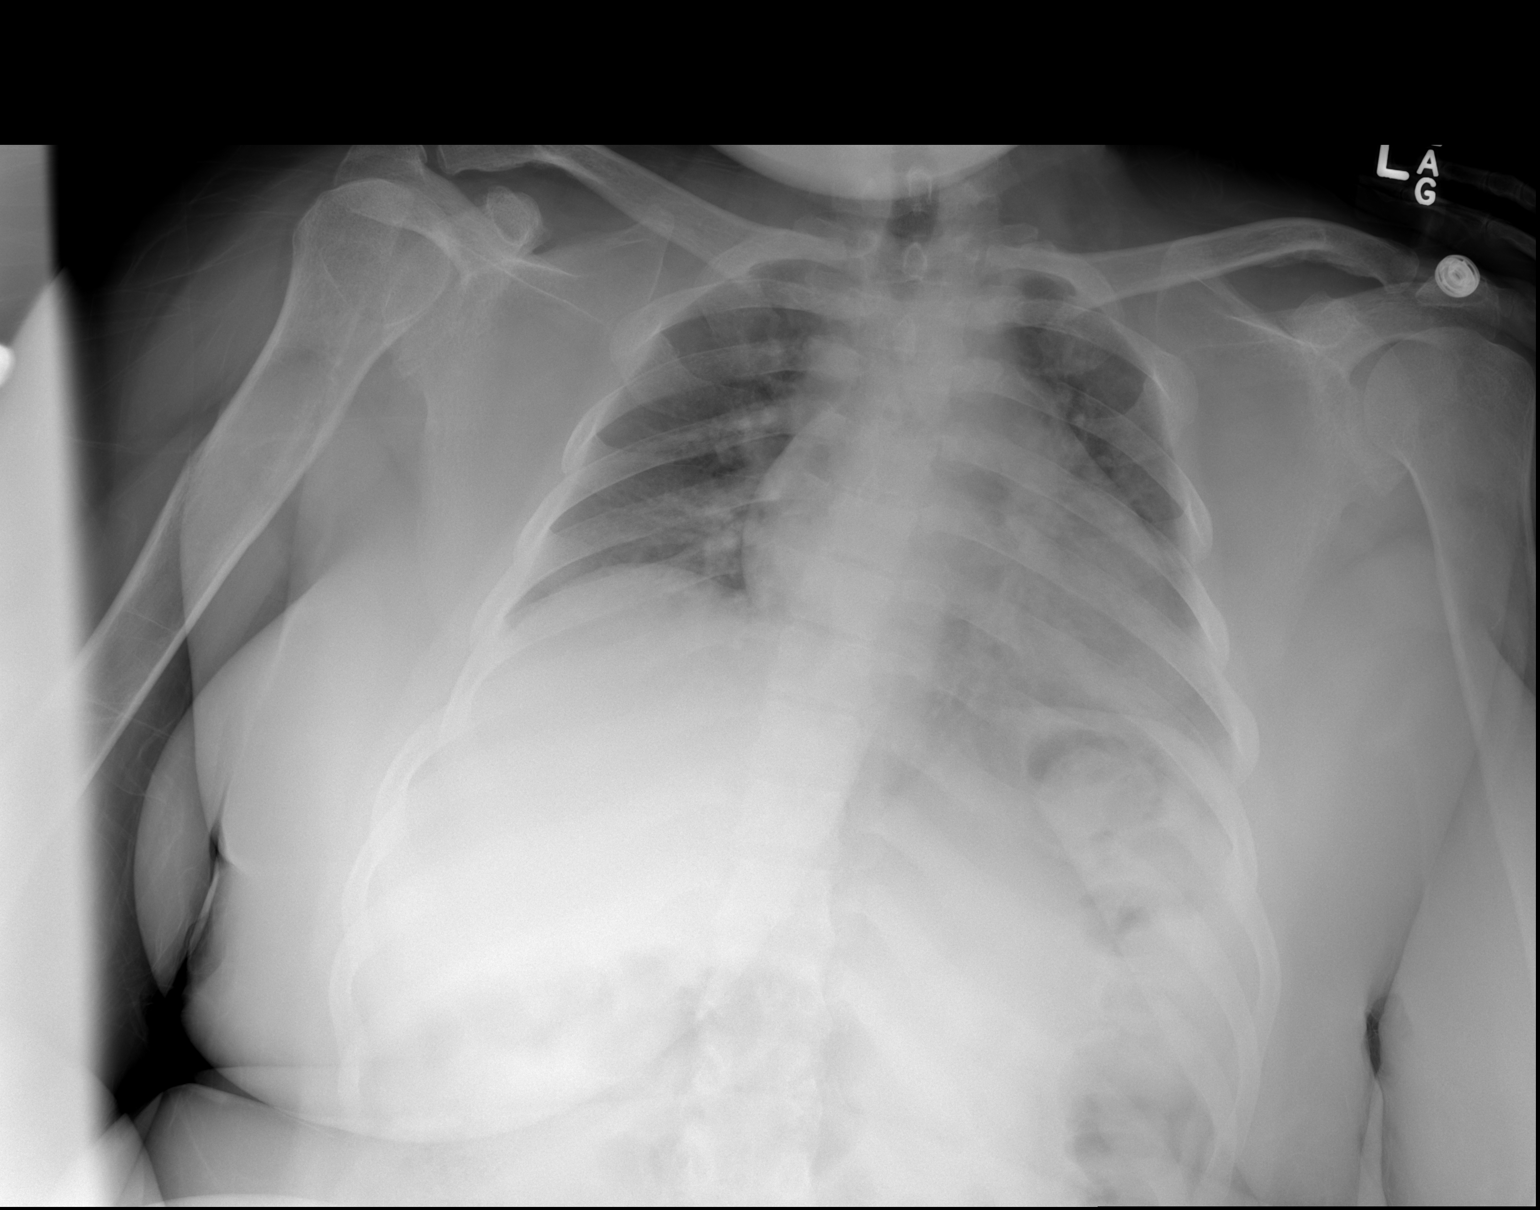

[2 of 2 positions shown; findings below may reference images not displayed]

FINDINGS: The lungs are hypoexpanded. Mild vascular crowding is noted.
Retrocardiac opacity may reflect atelectasis or possibly mild
pneumonia. There is no evidence of pleural effusion or pneumothorax.

The heart is borderline enlarged. No acute osseous abnormalities are
seen.
IMPRESSION: Lungs hypoexpanded. Retrocardiac airspace opacity may reflect
atelectasis or possibly mild pneumonia. Borderline cardiomegaly.

## 2017-09-13 ENCOUNTER — Ambulatory Visit: Payer: Medicare Other | Admitting: Occupational Therapy

## 2017-09-13 DIAGNOSIS — I890 Lymphedema, not elsewhere classified: Secondary | ICD-10-CM

## 2017-09-13 NOTE — Therapy (Signed)
[place] MAIN [facility] SERVICES [address] [place], [place], [address] Phone: [phone]   Fax:  [phone]  Occupational Therapy Treatment  Patient Details  Name: Michele [name] MRN: [id number] Date of Birth: 1986/03/05 Referring Provider: Everrett Coombe, MD   Encounter Date: 09/13/2017  OT End of Session - 09/13/17 1141    Visit Number  16    Number of Visits  36    Date for OT Re-Evaluation  10/11/17    OT Start Time  0900    OT Stop Time  0946    OT Time Calculation (min)  46 min    Activity Tolerance  Patient tolerated treatment well;No increased pain;Other (comment) limited by impaired    mobility, ambulation , language limitations and significant intellectual delay    Behavior During Therapy  [facility] for tasks assessed/performed       Past Medical History:  Diagnosis Date  . Cerebral palsy ([place])   . Hypothyroidism   . Lennox-Gastaut syndrome ([place])   . Osteoporosis   . Pneumonia   . Profound mental retardation   . Reflux   . Rickets   . Seizures ([place])     Past Surgical History:  Procedure Laterality Date  . FOOT MASS EXCISION Right   . Gastrocutaneous fistual repair  2012  . PEG TUBE PLACEMENT      There were no vitals filed for this visit.  Subjective Assessment - 09/13/17 1137    Subjective   Michele [name] presents for OT visit 16/ 36 for CDT to address BLE lymphedema. Pt is accommpanied by her mother today. Pt presents w/ compression wraps in place. Mother brings   new , custom, LLU compression garment and LLE HOS device to clinic for fitting.    Patient is accompained by:  Family member    Pertinent History  CP, Hypothyroidism, Lennox-Gastaut Syndrome, Osteoporosis, hx pneumonia, profound MR, rickets, seizure discorder, R foot     Currently in Pain?  No/denies                   OT Treatments/Exercises (OP) - 09/13/17 0001      ADLs   ADL Education Given  Yes      Manual Therapy   Manual Therapy  Edema  management;Compression Bandaging    Manual therapy comments  LLU compression garment fitting, assessment and remeasurement for LLE. Completed initial RLE garment/ device measurements.    Compression Bandaging  12 cm Rosidal foam applied over cotton stockinet to tibial tuberosity, overlapping equally and w/ only enopugh tensioon to ensure close contour without wronkles and bunching.. 10 and 12 cm compression wraps applied inm graduated layers using gradient techniques. Stretch net over all.             OT Education - 09/13/17 1139    Education provided  Yes    Education Details  LE ADL training included  explanation garment fitting process and results. Provided instruction for garment and device wear and care regimes andlaundry instructions.    Person(s) Educated  Patient;Parent(s)    Methods  Explanation;Demonstration    Comprehension  Verbalized understanding;Returned demonstration;Need further instruction          OT Long Term Goals - 08/22/17 1122      OT LONG TERM GOAL #1   Title  Pt to follow Lymphedema precautions and prevention strategies with maximum caregiver assistance to limit LE progression and further functional decline. (Primary caregiver modified independent w/ lymphedema precautions and prevention  principals and strategies using printed reference to limit LE progression and infection risk.)    Baseline  Max A  08/22/2017: Parent/ Primary CG achieved.    Time  2    Period  Weeks    Status  Achieved      OT LONG TERM GOAL #2   Title  Lymphedema (LE) management/ self-care: Pt able to apply knee length, multi layered, gradient compression wraps, one leg at a time, with max caregiver assist x 1 using proper techniques within 2 weeks to achieve optimal limb volume reduction in preparation for fitting compression garments/ devices.    Baseline  Dependent    08/22/2017: Parent/ Primary CG achieved. Independent    Time  2    Period  Weeks    Status  Achieved      OT LONG  TERM GOAL #3   Title  Lymphedema (LE) management/ self-care:  Pt to achieve at least 10%  BLE limb volume reductions  during Intensive Phase CDT to limit LE progression, to reduce infection risk, and to improve functional mobility and transfers essential for optimal ADLs performance.    Baseline  Dependent  08/22/2017: achied for LLE w/ 12% thus far.    Time  12    Period  Weeks    Status  Partially Met      OT LONG TERM GOAL #4   Title  Lymphedema (LE) management/ self-care:  Pt to tolerate daily compression wraps, compression garments and/ or HOS devices in keeping w/ prescribed wear regimes within 1 week of issue date of each to progress and retain clinical and functional gains and to limit LE progression.    Baseline  Max A 08/22/2017: Partially met. Pt tolerated LLE compression wraps without difficulty. LLE custom day and night time compression garments are ordered. awaiting Medicaid preauthorization.    Time  12    Period  Weeks    Status  New      OT LONG TERM GOAL #5   Title  Lymphedema (LE) management/ self-care:  During Management Phase CDT Pt to sustain current limb volumes within 3%, and all other clinical gains achieved during OT treatment iwith maximum caregiver assistance  (to control limb swelling and associated pain/ discomfort, to  limit LE progression, to decrease infection risk and to limit further functional decline.    Baseline  dependent    Time  6    Period  Months    Status  On-going            Plan - 09/13/17 1142    Clinical Impression Statement  LLE custom daytime compression knee high fits well at landmarks, but is too short overall. HOS device is also too short at top edge, despite anatomical measurement for leg length correct at initially measured 28 cm. OT completed new measurements for LLE, spacing landmarks at B, B1, C, and D 2-3 cm further apart and increasing overall length from 28 cm to 30 cm. Completed new circumferential measurements at these landmarks.  for both HOS and daytime garments. Completed anatomical measurents for RLE garment and device, and faxred order and troubleshooting photos to [employer]. Cont 2 x weekly and fit remakes and new RLE compression ASAP.    Occupational Profile and client history currently impacting functional performance  intractible seizures, CP, intelectual delay, quadriparesis, dependent on others for all besic and instrumental ADLs    Occupational performance deficits (Please refer to evaluation for details):  ADL's;IADL's;Work;Play;Leisure;Social Participation;Rest and Sleep;Education  Rehab Potential  Good    Current Impairments/barriers affecting progress:  impaired intellectual delay and quadraparesis    OT Frequency  3x / week    OT Treatment/Interventions  Self-care/ADL training;DME and/or AE instruction;Manual Therapy;Patient/family education;Manual lymph drainage;Compression bandaging;Therapeutic activities;Other (comment) BLE skin care during MLD with low ph Eucerin lotion and Palma Christii, skin grade castor oil.    Clinical Decision Making  Multiple treatment options, significant modification of task necessary    Recommended Other Services  Fit with appropriate, knee length, BLE compression garments and or devices- For daytime consider custom, flat knit, open toe, Jobst Elvarex elastic classic stockings  And /Or adjustable, Velcro close, CircAid, short stretch leg and foot piece for ease of donning and doffing. Fit w/ HOS devices, ( consider Jobst, ccl 2 Relax) to limit fibrosis formation and improve lymphatic transport during HOS.       Patient will benefit from skilled therapeutic intervention in order to improve the following deficits and impairments:  Decreased knowledge of use of DME, Decreased skin integrity, Increased edema, Decreased knowledge of precautions, Decreased safety awareness, Difficulty walking, Obesity, Decreased balance  Visit Diagnosis: Lymphedema, not elsewhere classified    Problem  List Patient Active Problem List   Diagnosis Date Noted  . Lennox-Gastaut syndrome ([place]) 07/06/2017  . Acute decrease in level of consciousness 06/29/2017  . Lymphedema 01/22/2017  . HCAP (healthcare-associated pneumonia) 12/06/2016  . Arterial hypotension   . Sepsis ([place]) 11/04/2016  . Other constipation   . Bilateral lower extremity edema 04/21/2016  . Unsteadiness on feet   . Community acquired pneumonia of left lower lobe of lung ([place]) 04/05/2016  . Cough 04/03/2016  . Rhinitis, non-allergic 06/23/2015  . Special screening examination for human papillomavirus (HPV) 07/20/2014  . Amenorrhea 05/19/2014  . 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 Long-term use of high-risk medication 12/24/2012  . Morbid obesity ([place]) 12/24/2012  . Congenital quadriplegia ([place]) 12/24/2012  . URINARY INCONTINENCE 02/25/2010  . Full incontinence of feces 02/25/2010  . DIGESTIVE SYSTEM COMPLICATION NEC 25/48/6282  . Hypothyroidism 01/29/2009  . 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 Infantile cerebral palsy ([place]) 01/29/2009  . GASTROESOPHAGEAL REFLUX DISEASE 01/29/2009  . Osteoporosis 01/29/2009  . Seizure disorder ([place]) 01/29/2009    Andrey Spearman, MS, OTR/L, [facility] 09/13/17 11:47 AM  [place] MAIN [facility] SERVICES [address] [place], [place], [address] Phone: [phone]   Fax:  [phone]  Name: Michele [name] MRN: [id number] Date of Birth: 1985-10-18

## 2017-09-17 ENCOUNTER — Ambulatory Visit: Payer: Medicare Other | Attending: Family Medicine | Admitting: Occupational Therapy

## 2017-09-17 DIAGNOSIS — I890 Lymphedema, not elsewhere classified: Secondary | ICD-10-CM | POA: Insufficient documentation

## 2017-09-17 NOTE — Therapy (Signed)
[place] MAIN [facility] SERVICES [address] [place], [place], [address] Phone: [phone]   Fax:  [phone]  Occupational Therapy Treatment  Patient Details  Name: Michele [name] MRN: [id number] Date of Birth: [birth year] Referring Provider: Everrett Coombe, MD   Encounter Date: 09/17/2017  OT End of Session - 09/17/17 0956    Visit Number  17    Number of Visits  36    Date for OT Re-Evaluation  10/11/17    OT Start Time  0904    OT Stop Time  0952    OT Time Calculation (min)  48 min    Activity Tolerance  Patient tolerated treatment well;No increased pain;Other (comment) limited by impaired    mobility, ambulation , language limitations and significant intellectual delay    Behavior During Therapy  [facility] for tasks assessed/performed       Past Medical History:  Diagnosis Date  . Cerebral palsy ([place])   . Hypothyroidism   . Lennox-Gastaut syndrome ([place])   . Osteoporosis   . Pneumonia   . Profound mental retardation   . Reflux   . Rickets   . Seizures ([place])     Past Surgical History:  Procedure Laterality Date  . FOOT MASS EXCISION Right   . Gastrocutaneous fistual repair  2012  . PEG TUBE PLACEMENT      There were no vitals filed for this visit.  Subjective Assessment - 09/17/17 0909    Subjective   Michele [name] presents for OT visit 16/ 36 for CDT to address BLE lymphedema. Pt is accommpanied by her mother today. Pt presents w/  1 of 2 knee length compression wraps in place. Mother questions increased distal leg swelling and slight redness at anterior ankle.    Patient is accompained by:  Family member    Pertinent History  CP, Hypothyroidism, Lennox-Gastaut Syndrome, Osteoporosis, hx pneumonia, profound MR, rickets, seizure discorder, R foot     Currently in Pain?  No/denies                   OT Treatments/Exercises (OP) - 09/17/17 0001      ADLs   ADL Education Given  Yes      Manual Therapy   Manual Therapy  Edema  management;Manual Lymphatic Drainage (MLD);Compression Bandaging    Edema Management  skin care to LLE w/ low ph [facility] (low ph, skin grade castor oil) during MLD to hydrate skin and improve skin flexibility    Manual Lymphatic Drainage (MLD)  MLD to LLE and RLE utilizing short neck sequence, deep abdominal lymphatics, and functional inguinal and politeal LN.    Compression Bandaging  added 12 cm wide roll of cast padding (Artiflex) from anikle to mid calf      to elarge circumference of lower leg to reduce tendency for wraps to bunch at narrowest location on leg. Applied compression wraps over Artiflex as established. Mother will appy 2nd SS bandage over first when they arrive home.             OT Education - 09/17/17 0956    Education provided  Yes    Education Details  cont Pt/ CG edu for LE self care throughout session    Person(s) Educated  Patient;Parent(s)    Methods  Explanation;Demonstration    Comprehension  Verbalized understanding;Returned demonstration          OT Long Term Goals - 08/22/17 1122      OT LONG TERM  GOAL #1   Title  Pt to follow Lymphedema precautions and prevention strategies with maximum caregiver assistance to limit LE progression and further functional decline. (Primary caregiver modified independent w/ lymphedema precautions and prevention principals and strategies using printed reference to limit LE progression and infection risk.)    Baseline  Max A  08/22/2017: Parent/ Primary CG achieved.    Time  2    Period  Weeks    Status  Achieved      OT LONG TERM GOAL #2   Title  Lymphedema (LE) management/ self-care: Pt able to apply knee length, multi layered, gradient compression wraps, one leg at a time, with max caregiver assist x 1 using proper techniques within 2 weeks to achieve optimal limb volume reduction in preparation for fitting compression garments/ devices.    Baseline  Dependent    08/22/2017: Parent/ Primary CG achieved. Independent     Time  2    Period  Weeks    Status  Achieved      OT LONG TERM GOAL #3   Title  Lymphedema (LE) management/ self-care:  Pt to achieve at least 10%  BLE limb volume reductions  during Intensive Phase CDT to limit LE progression, to reduce infection risk, and to improve functional mobility and transfers essential for optimal ADLs performance.    Baseline  Dependent  08/22/2017: achied for LLE w/ 12% thus far.    Time  12    Period  Weeks    Status  Partially Met      OT LONG TERM GOAL #4   Title  Lymphedema (LE) management/ self-care:  Pt to tolerate daily compression wraps, compression garments and/ or HOS devices in keeping w/ prescribed wear regimes within 1 week of issue date of each to progress and retain clinical and functional gains and to limit LE progression.    Baseline  Max A 08/22/2017: Partially met. Pt tolerated LLE compression wraps without difficulty. LLE custom day and night time compression garments are ordered. awaiting Medicaid preauthorization.    Time  12    Period  Weeks    Status  New      OT LONG TERM GOAL #5   Title  Lymphedema (LE) management/ self-care:  During Management Phase CDT Pt to sustain current limb volumes within 3%, and all other clinical gains achieved during OT treatment iwith maximum caregiver assistance  (to control limb swelling and associated pain/ discomfort, to  limit LE progression, to decrease infection risk and to limit further functional decline.    Baseline  dependent    Time  6    Period  Months    Status  On-going            Plan - 09/17/17 0957    Clinical Impression Statement  Pt tolerated MLD and skin care today without difficulty. Modified bandaging configuration by adding Artiflex distally to limit bunching at distal leg and redness at ankle and to improve lymphatic circulation . Vendor emailed to report garment remakes have been ordered. Cont as per POC.  Reduce frequency to 2 x weekly until garment fitting is complete.      Occupational Profile and client history currently impacting functional performance  intractible seizures, CP, intelectual delay, quadriparesis, dependent on others for all besic and instrumental ADLs    Occupational performance deficits (Please refer to evaluation for details):  ADL's;IADL's;Work;Play;Leisure;Social Participation;Rest and Sleep;Education    Rehab Potential  Good    Current Impairments/barriers affecting progress:  impaired intellectual delay and quadraparesis    OT Frequency  2x / week    OT Treatment/Interventions  Self-care/ADL training;DME and/or AE instruction;Manual Therapy;Patient/family education;Manual lymph drainage;Compression bandaging;Therapeutic activities;Other (comment) BLE skin care during MLD with low ph Eucerin lotion and Palma Christii, skin grade castor oil.    Clinical Decision Making  Multiple treatment options, significant modification of task necessary    Recommended Other Services  Fit with appropriate, knee length, BLE compression garments and or devices- For daytime consider custom, flat knit, open toe, Jobst Elvarex elastic classic stockings  And /Or adjustable, Velcro close, CircAid, short stretch leg and foot piece for ease of donning and doffing. Fit w/ HOS devices, ( consider Jobst, ccl 2 Relax) to limit fibrosis formation and improve lymphatic transport during HOS.       Patient will benefit from skilled therapeutic intervention in order to improve the following deficits and impairments:  Decreased knowledge of use of DME, Decreased skin integrity, Increased edema, Decreased knowledge of precautions, Decreased safety awareness, Difficulty walking, Obesity, Decreased balance  Visit Diagnosis: Lymphedema, not elsewhere classified    Problem List Patient Active Problem List   Diagnosis Date Noted  . Lennox-Gastaut syndrome ([place]) 07/06/2017  . Acute decrease in level of consciousness 06/29/2017  . Lymphedema 01/22/2017  . HCAP  (healthcare-associated pneumonia) 12/06/2016  . Arterial hypotension   . Sepsis ([place]) 11/04/2016  . Other constipation   . Bilateral lower extremity edema 04/21/2016  . Unsteadiness on feet   . Community acquired pneumonia of left lower lobe of lung ([place]) 04/05/2016  . Cough 04/03/2016  . Rhinitis, non-allergic 06/23/2015  . Special screening examination for human papillomavirus (HPV) 07/20/2014  . Amenorrhea 05/19/2014  . 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 Long-term use of high-risk medication 12/24/2012  . Morbid obesity ([place]) 12/24/2012  . Congenital quadriplegia ([place]) 12/24/2012  . URINARY INCONTINENCE 02/25/2010  . Full incontinence of feces 02/25/2010  . DIGESTIVE SYSTEM COMPLICATION NEC 16/57/9038  . Hypothyroidism 01/29/2009  . 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 Infantile cerebral palsy ([place]) 01/29/2009  . GASTROESOPHAGEAL REFLUX DISEASE 01/29/2009  . Osteoporosis 01/29/2009  . Seizure disorder ([place]) 01/29/2009    Andrey Spearman, MS, OTR/L, [facility] 09/17/17 10:00 AM  [place] MAIN [facility] SERVICES [address] [place], [place], [address] Phone: [phone]   Fax:  [phone]  Name: Michele [name] MRN: [id number] Date of Birth: 05-Sep-1985

## 2017-09-18 ENCOUNTER — Encounter: Payer: Medicare Other | Admitting: Occupational Therapy

## 2017-09-20 ENCOUNTER — Ambulatory Visit: Payer: Medicare Other | Admitting: Occupational Therapy

## 2017-09-20 DIAGNOSIS — I890 Lymphedema, not elsewhere classified: Secondary | ICD-10-CM

## 2017-09-20 NOTE — Therapy (Signed)
[place] MAIN [facility] SERVICES [address] [place], [place], [address] Phone: [phone]   Fax:  [phone]  Occupational Therapy Treatment  Patient Details  Name: Michele [name] MRN: [id number] Date of Birth: [birth year] Referring Provider: Everrett Coombe, MD   Encounter Date: 09/20/2017  OT End of Session - 09/20/17 1015    Visit Number  18    Number of Visits  36    Date for OT Re-Evaluation  10/11/17    OT Start Time  0900    OT Stop Time  0946    OT Time Calculation (min)  46 min    Activity Tolerance  Patient tolerated treatment well;No increased pain;Other (comment) limited by impaired    mobility, ambulation , language limitations and significant intellectual delay    Behavior During Therapy  [facility] for tasks assessed/performed       Past Medical History:  Diagnosis Date  . Cerebral palsy ([place])   . Hypothyroidism   . Lennox-Gastaut syndrome ([place])   . Osteoporosis   . Pneumonia   . Profound mental retardation   . Reflux   . Rickets   . Seizures ([place])     Past Surgical History:  Procedure Laterality Date  . FOOT MASS EXCISION Right   . Gastrocutaneous fistual repair  2012  . PEG TUBE PLACEMENT      There were no vitals filed for this visit.  Subjective Assessment - 09/20/17 1013    Subjective   Michele [name] presents for OT visit 18/ 36 for CDT to address BLE lymphedema. Pt is accommpanied by her mother today. Pt presents w/  1 of 2 knee length compression wraps in place. Mother has no new concerns today.    Patient is accompained by:  Family member    Pertinent History  CP, Hypothyroidism, Lennox-Gastaut Syndrome, Osteoporosis, hx pneumonia, profound MR, rickets, seizure discorder, R foot     Currently in Pain?  No/denies                   OT Treatments/Exercises (OP) - 09/20/17 0001      ADLs   ADL Education Given  Yes      Manual Therapy   Manual Therapy  Edema management;Manual Lymphatic Drainage  (MLD);Compression Bandaging    Edema Management  skin care to LLE w/ low ph [facility] (low ph, skin grade castor oil) during MLD to hydrate skin and improve skin flexibility    Manual Lymphatic Drainage (MLD)  MLD to LLE and RLE utilizing short neck sequence, deep abdominal lymphatics, and functional inguinal and politeal LN.    Compression Bandaging  added 12 cm wide roll of cast padding (Artiflex) from anikle to mid calf      to elarge circumference of lower leg to reduce tendency for wraps to bunch at narrowest location on leg. Applied compression wraps over Artiflex as established. Mother will appy 2nd SS bandage over first when they arrive home.             OT Education - 09/20/17 1015    Education provided  Yes    Education Details  Continued skilled Pt/caregiver education  And LE ADL training throughout visit for lymphedema self care/ home program, including compression wrapping, compression garment and device wear/care, lymphatic pumping ther ex, simple self-MLD, and skin care. Discussed progress towards goals.     Person(s) Educated  Patient;Parent(s)    Methods  Explanation;Demonstration    Comprehension  Verbalized understanding;Returned demonstration  OT Long Term Goals - 08/22/17 1122      OT LONG TERM GOAL #1   Title  Pt to follow Lymphedema precautions and prevention strategies with maximum caregiver assistance to limit LE progression and further functional decline. (Primary caregiver modified independent w/ lymphedema precautions and prevention principals and strategies using printed reference to limit LE progression and infection risk.)    Baseline  Max A  08/22/2017: Parent/ Primary CG achieved.    Time  2    Period  Weeks    Status  Achieved      OT LONG TERM GOAL #2   Title  Lymphedema (LE) management/ self-care: Pt able to apply knee length, multi layered, gradient compression wraps, one leg at a time, with max caregiver assist x 1 using proper  techniques within 2 weeks to achieve optimal limb volume reduction in preparation for fitting compression garments/ devices.    Baseline  Dependent    08/22/2017: Parent/ Primary CG achieved. Independent    Time  2    Period  Weeks    Status  Achieved      OT LONG TERM GOAL #3   Title  Lymphedema (LE) management/ self-care:  Pt to achieve at least 10%  BLE limb volume reductions  during Intensive Phase CDT to limit LE progression, to reduce infection risk, and to improve functional mobility and transfers essential for optimal ADLs performance.    Baseline  Dependent  08/22/2017: achied for LLE w/ 12% thus far.    Time  12    Period  Weeks    Status  Partially Met      OT LONG TERM GOAL #4   Title  Lymphedema (LE) management/ self-care:  Pt to tolerate daily compression wraps, compression garments and/ or HOS devices in keeping w/ prescribed wear regimes within 1 week of issue date of each to progress and retain clinical and functional gains and to limit LE progression.    Baseline  Max A 08/22/2017: Partially met. Pt tolerated LLE compression wraps without difficulty. LLE custom day and night time compression garments are ordered. awaiting Medicaid preauthorization.    Time  12    Period  Weeks    Status  New      OT LONG TERM GOAL #5   Title  Lymphedema (LE) management/ self-care:  During Management Phase CDT Pt to sustain current limb volumes within 3%, and all other clinical gains achieved during OT treatment iwith maximum caregiver assistance  (to control limb swelling and associated pain/ discomfort, to  limit LE progression, to decrease infection risk and to limit further functional decline.    Baseline  dependent    Time  6    Period  Months    Status  On-going            Plan - 09/20/17 1016    Clinical Impression Statement  Performed MLD, skin care and reapplied compression wraps  today. Pt was in a playful mood and a little rowdy today, so 1 hr session abbreviated to 46  minutes. Pt continues to demonstrate progress towards all goals. Cojnt as per POC 2 x weekly until custom compression garments and devices are fitted.    Occupational Profile and client history currently impacting functional performance  intractible seizures, CP, intelectual delay, quadriparesis, dependent on others for all besic and instrumental ADLs    Occupational performance deficits (Please refer to evaluation for details):  ADL's;IADL's;Work;Play;Leisure;Social Participation;Rest and Sleep;Education    Rehab Potential  Good    Current Impairments/barriers affecting progress:  impaired intellectual delay and quadraparesis    OT Frequency  2x / week    OT Treatment/Interventions  Self-care/ADL training;DME and/or AE instruction;Manual Therapy;Patient/family education;Manual lymph drainage;Compression bandaging;Therapeutic activities;Other (comment) BLE skin care during MLD with low ph Eucerin lotion and Palma Christii, skin grade castor oil.    Clinical Decision Making  Multiple treatment options, significant modification of task necessary    Recommended Other Services  Fit with appropriate, knee length, BLE compression garments and or devices- For daytime consider custom, flat knit, open toe, Jobst Elvarex elastic classic stockings  And /Or adjustable, Velcro close, CircAid, short stretch leg and foot piece for ease of donning and doffing. Fit w/ HOS devices, ( consider Jobst, ccl 2 Relax) to limit fibrosis formation and improve lymphatic transport during HOS.       Patient will benefit from skilled therapeutic intervention in order to improve the following deficits and impairments:  Decreased knowledge of use of DME, Decreased skin integrity, Increased edema, Decreased knowledge of precautions, Decreased safety awareness, Difficulty walking, Obesity, Decreased balance  Visit Diagnosis: Lymphedema, not elsewhere classified    Problem List Patient Active Problem List   Diagnosis Date Noted   . Lennox-Gastaut syndrome ([place]) 07/06/2017  . Acute decrease in level of consciousness 06/29/2017  . Lymphedema 01/22/2017  . HCAP (healthcare-associated pneumonia) 12/06/2016  . Arterial hypotension   . Sepsis ([place]) 11/04/2016  . Other constipation   . Bilateral lower extremity edema 04/21/2016  . Unsteadiness on feet   . Community acquired pneumonia of left lower lobe of lung ([place]) 04/05/2016  . Cough 04/03/2016  . Rhinitis, non-allergic 06/23/2015  . Special screening examination for human papillomavirus (HPV) 07/20/2014  . Amenorrhea 05/19/2014  . 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 Long-term use of high-risk medication 12/24/2012  . Morbid obesity ([place]) 12/24/2012  . Congenital quadriplegia ([place]) 12/24/2012  . URINARY INCONTINENCE 02/25/2010  . Full incontinence of feces 02/25/2010  . DIGESTIVE SYSTEM COMPLICATION NEC 24/23/5361  . Hypothyroidism 01/29/2009  . 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 Infantile cerebral palsy ([place]) 01/29/2009  . GASTROESOPHAGEAL REFLUX DISEASE 01/29/2009  . Osteoporosis 01/29/2009  . Seizure disorder ([place]) 01/29/2009    Andrey Spearman, MS, OTR/L, [facility] 09/20/17 10:21 AM   [place] MAIN [facility] SERVICES [address] [place], [place], [address] Phone: [phone]   Fax:  [phone]  Name: ANHAR MCDERMOTT MRN: [id number] Date of Birth: May 18, 1986

## 2017-09-24 ENCOUNTER — Ambulatory Visit: Payer: Medicare Other | Admitting: Occupational Therapy

## 2017-09-24 ENCOUNTER — Telehealth: Payer: Self-pay

## 2017-09-24 NOTE — Telephone Encounter (Signed)
Received fax from [facility] needing clinical questions answered for patient's Protonix suspension. Questions placed in PCP's box. Ples SpecterAlisa Amarise Lillo, RN [facility])

## 2017-09-25 ENCOUNTER — Ambulatory Visit: Payer: Medicare Other | Admitting: Occupational Therapy

## 2017-09-25 DIAGNOSIS — I890 Lymphedema, not elsewhere classified: Secondary | ICD-10-CM | POA: Diagnosis not present

## 2017-09-25 NOTE — Therapy (Signed)
[place] MAIN [facility] SERVICES [address] [place], [place], [address] Phone: [phone]   Fax:  [phone]  Occupational Therapy Treatment  Patient Details  Name: Michele [name] MRN: [id number] Date of Birth: 01/04/1986 Referring Provider: Everrett Coombe, MD   Encounter Date: 09/25/2017  OT End of Session - 09/25/17 1649    Visit Number  19    Number of Visits  36    Date for OT Re-Evaluation  10/11/17    OT Start Time  0900    OT Stop Time  0945    OT Time Calculation (min)  45 min    Equipment Utilized During Treatment  Tyvek sock, friction gloves    Activity Tolerance  Patient tolerated treatment well;No increased pain;Other (comment) limited by impaired    mobility, ambulation , language limitations and significant intellectual delay    Behavior During Therapy  [facility] for tasks assessed/performed       Past Medical History:  Diagnosis Date  . Cerebral palsy ([place])   . Hypothyroidism   . Lennox-Gastaut syndrome ([place])   . Osteoporosis   . Pneumonia   . Profound mental retardation   . Reflux   . Rickets   . Seizures ([place])     Past Surgical History:  Procedure Laterality Date  . FOOT MASS EXCISION Right   . Gastrocutaneous fistual repair  2012  . PEG TUBE PLACEMENT      There were no vitals filed for this visit.  Subjective Assessment - 09/25/17 1312    Subjective   Michele [name] presents for OT visit 19/ 36 for CDT to address BLE lymphedema. Pt is accommpanied by her mother today. Mother brings remade custom compression garments and HOS devices to clinic for fitting.    Patient is accompained by:  Family member    Pertinent History  CP, Hypothyroidism, Lennox-Gastaut Syndrome, Osteoporosis, hx pneumonia, profound MR, rickets, seizure discorder, R foot     Currently in Pain?  No/denies                   OT Treatments/Exercises (OP) - 09/25/17 0001      ADLs   ADL Education Given  Yes             OT Education -  09/25/17 1313    Education provided  Yes    Education Details  Emphasis of Pt/ CG edu today on proper techniques for donning / doffing custom compression garments and HOS devices utilizing assistive devices. Reviewed printed references for compression garment wear and care guidelines.    Person(s) Educated  Patient;Parent(s)    Methods  Explanation;Demonstration;Tactile cues;Verbal cues;Handout    Comprehension  Verbalized understanding;Returned demonstration;Verbal cues required;Tactile cues required          OT Long Term Goals - 08/22/17 1122      OT LONG TERM GOAL #1   Title  Pt to follow Lymphedema precautions and prevention strategies with maximum caregiver assistance to limit LE progression and further functional decline. (Primary caregiver modified independent w/ lymphedema precautions and prevention principals and strategies using printed reference to limit LE progression and infection risk.)    Baseline  Max A  08/22/2017: Parent/ Primary CG achieved.    Time  2    Period  Weeks    Status  Achieved      OT LONG TERM GOAL #2   Title  Lymphedema (LE) management/ self-care: Pt able to apply knee length, multi layered, gradient compression wraps,  one leg at a time, with max caregiver assist x 1 using proper techniques within 2 weeks to achieve optimal limb volume reduction in preparation for fitting compression garments/ devices.    Baseline  Dependent    08/22/2017: Parent/ Primary CG achieved. Independent    Time  2    Period  Weeks    Status  Achieved      OT LONG TERM GOAL #3   Title  Lymphedema (LE) management/ self-care:  Pt to achieve at least 10%  BLE limb volume reductions  during Intensive Phase CDT to limit LE progression, to reduce infection risk, and to improve functional mobility and transfers essential for optimal ADLs performance.    Baseline  Dependent  08/22/2017: achied for LLE w/ 12% thus far.    Time  12    Period  Weeks    Status  Partially Met      OT LONG  TERM GOAL #4   Title  Lymphedema (LE) management/ self-care:  Pt to tolerate daily compression wraps, compression garments and/ or HOS devices in keeping w/ prescribed wear regimes within 1 week of issue date of each to progress and retain clinical and functional gains and to limit LE progression.    Baseline  Max A 08/22/2017: Partially met. Pt tolerated LLE compression wraps without difficulty. LLE custom day and night time compression garments are ordered. awaiting Medicaid preauthorization.    Time  12    Period  Weeks    Status  New      OT LONG TERM GOAL #5   Title  Lymphedema (LE) management/ self-care:  During Management Phase CDT Pt to sustain current limb volumes within 3%, and all other clinical gains achieved during OT treatment iwith maximum caregiver assistance  (to control limb swelling and associated pain/ discomfort, to  limit LE progression, to decrease infection risk and to limit further functional decline.    Baseline  dependent    Time  6    Period  Months    Status  On-going            Plan - 09/25/17 1635    Clinical Impression Statement  Pt fitted with REMADE, BLE, knee length, custom, flat knit, Jobst Elvarex elastic compression stockings today and custom, Jobst RELAX devices for HOS. Both remakes fitted perfectly. After skilled education mother was able to don and doff garments correctly using assistive devices, with extra time, and with min OT assistance. MMother also educated on compression garment/ device wear and care. Parent agrees with plan to wash and wear garmwents and devices daily as perscribed and return for 1 week F/U for final fitting assessment and limb volumetrics. If Pt is doing well with no complaints at that time we'll see Michele [name] back in 4-6 weeks.     Occupational Profile and client history currently impacting functional performance  intractible seizures, CP, intelectual delay, quadriparesis, dependent on others for all besic and instrumental ADLs     Occupational performance deficits (Please refer to evaluation for details):  ADL's;IADL's;Work;Play;Leisure;Social Participation;Rest and Sleep;Education    Rehab Potential  Good    Current Impairments/barriers affecting progress:  impaired intellectual delay and quadraparesis    OT Frequency  2x / week    OT Treatment/Interventions  Self-care/ADL training;DME and/or AE instruction;Manual Therapy;Patient/family education;Manual lymph drainage;Compression bandaging;Therapeutic activities;Other (comment) BLE skin care during MLD with low ph Eucerin lotion and Palma Christii, skin grade castor oil.    Clinical Decision Making  Multiple treatment options, significant  modification of task necessary    Recommended Other Services  Fit with appropriate, knee length, BLE compression garments and or devices- For daytime consider custom, flat knit, open toe, Jobst Elvarex elastic classic stockings  And /Or adjustable, Velcro close, CircAid, short stretch leg and foot piece for ease of donning and doffing. Fit w/ HOS devices, ( consider Jobst, ccl 2 Relax) to limit fibrosis formation and improve lymphatic transport during HOS.       Patient will benefit from skilled therapeutic intervention in order to improve the following deficits and impairments:  Decreased knowledge of use of DME, Decreased skin integrity, Increased edema, Decreased knowledge of precautions, Decreased safety awareness, Difficulty walking, Obesity, Decreased balance  Visit Diagnosis: Lymphedema, not elsewhere classified    Problem List Patient Active Problem List   Diagnosis Date Noted  . Lennox-Gastaut syndrome ([place]) 07/06/2017  . Acute decrease in level of consciousness 06/29/2017  . Lymphedema 01/22/2017  . HCAP (healthcare-associated pneumonia) 12/06/2016  . Arterial hypotension   . Sepsis ([place]) 11/04/2016  . Other constipation   . Bilateral lower extremity edema 04/21/2016  . Unsteadiness on feet   . Community acquired  pneumonia of left lower lobe of lung ([place]) 04/05/2016  . Cough 04/03/2016  . Rhinitis, non-allergic 06/23/2015  . Special screening examination for human papillomavirus (HPV) 07/20/2014  . Amenorrhea 05/19/2014  . 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 Long-term use of high-risk medication 12/24/2012  . Morbid obesity ([place]) 12/24/2012  . Congenital quadriplegia ([place]) 12/24/2012  . URINARY INCONTINENCE 02/25/2010  . Full incontinence of feces 02/25/2010  . DIGESTIVE SYSTEM COMPLICATION NEC 16/03/9603  . Hypothyroidism 01/29/2009  . 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 Infantile cerebral palsy ([place]) 01/29/2009  . GASTROESOPHAGEAL REFLUX DISEASE 01/29/2009  . Osteoporosis 01/29/2009  . Seizure disorder ([place]) 01/29/2009    Andrey Spearman, MS, OTR/L, [facility] 09/25/17 4:50 PM   [place] MAIN [facility] SERVICES [address] [place], [place], [address] Phone: [phone]   Fax:  [phone]  Name: Michele [name] MRN: [id number] Date of Birth: [birth year]

## 2017-09-27 ENCOUNTER — Other Ambulatory Visit: Payer: Self-pay | Admitting: Family Medicine

## 2017-09-27 ENCOUNTER — Telehealth (INDEPENDENT_AMBULATORY_CARE_PROVIDER_SITE_OTHER): Payer: Self-pay | Admitting: Family

## 2017-09-27 ENCOUNTER — Other Ambulatory Visit: Payer: Self-pay | Admitting: Student in an Organized Health Care Education/Training Program

## 2017-09-27 ENCOUNTER — Encounter: Payer: Medicare Other | Admitting: Occupational Therapy

## 2017-09-27 DIAGNOSIS — K219 Gastro-esophageal reflux disease without esophagitis: Secondary | ICD-10-CM

## 2017-09-27 DIAGNOSIS — G40319 Generalized idiopathic epilepsy and epileptic syndromes, intractable, without status epilepticus: Secondary | ICD-10-CM

## 2017-09-27 MED ORDER — LEVOCARNITINE 1 GM/10ML PO SOLN: ORAL | 5 refills | Status: DC

## 2017-09-27 NOTE — Telephone Encounter (Signed)
Rx has been electronically sent to the pharmacy 

## 2017-09-29 ENCOUNTER — Other Ambulatory Visit: Payer: Self-pay | Admitting: Student in an Organized Health Care Education/Training Program

## 2017-10-01 ENCOUNTER — Ambulatory Visit: Payer: Medicare Other | Admitting: Occupational Therapy

## 2017-10-02 ENCOUNTER — Encounter: Payer: Medicare Other | Admitting: Occupational Therapy

## 2017-10-03 ENCOUNTER — Telehealth: Payer: Self-pay

## 2017-10-03 NOTE — Telephone Encounter (Signed)
[facility] need clarification on the sig for Protonix that was eprescribed.   Call back is [phone]. Ples SpecterAlisa Brake, RN [facility])

## 2017-10-04 ENCOUNTER — Encounter: Payer: Medicare Other | Admitting: Occupational Therapy

## 2017-10-08 ENCOUNTER — Ambulatory Visit: Payer: Medicare Other | Admitting: Occupational Therapy

## 2017-10-08 ENCOUNTER — Encounter: Payer: Self-pay | Admitting: Occupational Therapy

## 2017-10-08 ENCOUNTER — Other Ambulatory Visit: Payer: Self-pay | Admitting: Student in an Organized Health Care Education/Training Program

## 2017-10-08 DIAGNOSIS — K219 Gastro-esophageal reflux disease without esophagitis: Secondary | ICD-10-CM

## 2017-10-08 DIAGNOSIS — I890 Lymphedema, not elsewhere classified: Secondary | ICD-10-CM | POA: Diagnosis not present

## 2017-10-08 MED ORDER — PANTOPRAZOLE SODIUM 40 MG PO PACK: PACK | ORAL | 0 refills | Status: DC

## 2017-10-08 NOTE — Telephone Encounter (Signed)
Pt mother informed of medication refill. Sunday SpillersSharon T Celise Bazar, CMA

## 2017-10-08 NOTE — Telephone Encounter (Signed)
Clinical questions and a request for clarification for this medication to be approved are in your box still. Ples SpecterAlisa Shaneca Orne, RN [facility] RN)

## 2017-10-08 NOTE — Telephone Encounter (Signed)
Another note sent to PCP. Alisa Brake, RN ([facility] RN)  

## 2017-10-08 NOTE — Telephone Encounter (Signed)
This med was refilled on 4/13 but I can send it again

## 2017-10-08 NOTE — Telephone Encounter (Signed)
[facility] Will await response. Ples SpecterAlisa Takenya Travaglini, RN [facility] RN)

## 2017-10-08 NOTE — Telephone Encounter (Signed)
Another note sent to PCP. Ples SpecterAlisa Brake, RN [facility])

## 2017-10-09 ENCOUNTER — Encounter: Payer: Medicare Other | Admitting: Occupational Therapy

## 2017-10-09 NOTE — Patient Instructions (Signed)

## 2017-10-09 NOTE — Therapy (Signed)
[place] MAIN [facility] SERVICES [address] [place], [place], [address] Phone: [phone]   Fax:  [phone]  Occupational Therapy Treatment Note and Progress Report: Lymphedema Care  Patient Details  Name: Michele [name] MRN: [id number] Date of Birth: November 30, 1985 Referring Provider: Everrett Coombe, MD   Encounter Date: 10/08/2017  OT End of Session - 10/09/17 1017    Visit Number  20    Number of Visits  36    Date for OT Re-Evaluation  10/11/17    OT Start Time  0907    OT Stop Time  0953    OT Time Calculation (min)  46 min    Equipment Utilized During Treatment  Tyvek sock, friction gloves    Activity Tolerance  Patient tolerated treatment well;No increased pain;Other (comment) limited by impaired    mobility, ambulation , language limitations and significant intellectual delay    Behavior During Therapy  [facility] for tasks assessed/performed       Past Medical History:  Diagnosis Date  . Cerebral palsy ([place])   . Hypothyroidism   . Lennox-Gastaut syndrome ([place])   . Osteoporosis   . Pneumonia   . Profound mental retardation   . Reflux   . Rickets   . Seizures ([place])     Past Surgical History:  Procedure Laterality Date  . FOOT MASS EXCISION Right   . Gastrocutaneous fistual repair  2012  . PEG TUBE PLACEMENT      There were no vitals filed for this visit.  Subjective Assessment - 10/08/17 0920    Subjective   Michele [name] presents for OT visit 20/ 36 for CDT to address BLE lymphedema. Michele [name] was last seen on April 4th and she returns today for follow up as she transitions to Managemnent  Phase CDT. He mother reports no new concerns. She is happy with Michele [name]'s custom compression garments for daytime and HOS.  (Pended)     Patient is accompained by:  Family member  (Pended)     Pertinent History  CP, Hypothyroidism, Lennox-Gastaut Syndrome, Osteoporosis, hx pneumonia, profound MR, rickets, seizure discorder, R foot   (Pended)     Currently in  Pain?  No/denies  (Pended)                    OT Treatments/Exercises (OP) - 10/09/17 0001      ADLs   ADL Education Given  Yes      Manual Therapy   Manual Therapy  Edema management    Edema Management  custom compression garment f/u- garment fit and function assessment. CG support             OT Education - 10/08/17 1013    Education provided  Yes    Education Details  Reviewed LE precautinas and all LE self-care protocols and garment wear and care regimes. Reviewed donning and doffing using assistive devices.    Person(s) Educated  Parent(s)    Methods  Explanation;Demonstration;Handout    Comprehension  Verbalized understanding;Returned demonstration          OT Long Term Goals - 10/08/17 1022      OT LONG TERM GOAL #1   Title  Pt to follow Lymphedema precautions and prevention strategies with maximum caregiver assistance to limit LE progression and further functional decline. (Primary caregiver modified independent w/ lymphedema precautions and prevention principals and strategies using printed reference to limit LE progression and infection risk.)    Baseline  Max A  08/22/2017: Parent/ Primary CG achieved.    Time  2    Period  Weeks    Status  Achieved      OT LONG TERM GOAL #2   Title  Lymphedema (LE) management/ self-care: Pt able to apply knee length, multi layered, gradient compression wraps, one leg at a time, with max caregiver assist x 1 using proper techniques within 2 weeks to achieve optimal limb volume reduction in preparation for fitting compression garments/ devices.    Baseline  Dependent    08/22/2017: Parent/ Primary CG achieved. Independent    Time  2    Period  Weeks    Status  Achieved      OT LONG TERM GOAL #3   Title  Lymphedema (LE) management/ self-care:  Pt to achieve at least 10%  BLE limb volume reductions  during Intensive Phase CDT to limit LE progression, to reduce infection risk, and to improve functional mobility and  transfers essential for optimal ADLs performance.    Baseline  Dependent  08/22/2017: achied for LLE w/ 12% thus far.    Time  12    Period  Weeks    Status  Partially Met      OT LONG TERM GOAL #4   Title  Lymphedema (LE) management/ self-care:  Pt to tolerate daily compression wraps, compression garments and/ or HOS devices in keeping w/ prescribed wear regimes within 1 week of issue date of each to progress and retain clinical and functional gains and to limit LE progression.    Baseline  Max A 08/22/2017: Partially met. Pt tolerated LLE compression wraps without difficulty. LLE custom day and night time compression garments are ordered. awaiting Medicaid preauthorization.    Time  12    Period  Weeks    Status  Achieved      OT LONG TERM GOAL #5   Title  Lymphedema (LE) management/ self-care:  During Management Phase CDT Pt to sustain current limb volumes within 3%, and all other clinical gains achieved during OT treatment iwith maximum caregiver assistance  (to control limb swelling and associated pain/ discomfort, to  limit LE progression, to decrease infection risk and to limit further functional decline.    Baseline  dependent    Time  6    Period  Months    Status  On-going            Plan - 10/08/17 1017    Clinical Impression Statement  Parent states she is very pleased with custom compression knee highs. Pt is using them daily and other CG are able to don and doff without difficulty. Mother reports Michele [name] is tolerating them without difficulty. Swelling appears well controlled an  garment fit, function and comfort appears to be appropriate. Provided review of all LE self care protocols and reviewed LE precautions and prevention principles. By end of session mother able to perform full BLE simple self-MLD sequences for BLE w/ Max A. Mom seemed reluctant to perform MLD during session. Provided handout for reference and will review at long term F/U. Today marks transition to Management  Phase CDT for BLE lymphedema. Pt will return in 6-8 weeks and will call PRN to check progress towards long term volumetric retention goal.     Occupational Profile and client history currently impacting functional performance  intractible seizures, CP, intelectual delay, quadriparesis, dependent on others for all besic and instrumental ADLs    Occupational performance deficits (Please refer to evaluation for details):  ADL's;IADL's;Work;Play;Leisure;Social  Participation;Rest and Sleep;Education    Rehab Potential  Good    Current Impairments/barriers affecting progress:  impaired intellectual delay and quadraparesis    OT Frequency  2x / week    OT Treatment/Interventions  Self-care/ADL training;DME and/or AE instruction;Manual Therapy;Patient/family education;Manual lymph drainage;Compression bandaging;Therapeutic activities;Other (comment) BLE skin care during MLD with low ph Eucerin lotion and Palma Christii, skin grade castor oil.    Clinical Decision Making  Multiple treatment options, significant modification of task necessary    Recommended Other Services  Fit with appropriate, knee length, BLE compression garments and or devices- For daytime consider custom, flat knit, open toe, Jobst Elvarex elastic classic stockings  And /Or adjustable, Velcro close, CircAid, short stretch leg and foot piece for ease of donning and doffing. Fit w/ HOS devices, ( consider Jobst, ccl 2 Relax) to limit fibrosis formation and improve lymphatic transport during HOS.       Patient will benefit from skilled therapeutic intervention in order to improve the following deficits and impairments:  Decreased knowledge of use of DME, Decreased skin integrity, Increased edema, Decreased knowledge of precautions, Decreased safety awareness, Difficulty walking, Obesity, Decreased balance  Visit Diagnosis: Lymphedema, not elsewhere classified    Problem List Patient Active Problem List   Diagnosis Date Noted  .  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 Acute decrease in level of consciousness 06/29/2017  . Lymphedema 01/22/2017  . HCAP (healthcare-associated pneumonia) 12/06/2016  . Arterial hypotension   . Sepsis ([place]) 11/04/2016  . Other constipation   . Bilateral lower extremity edema 04/21/2016  . Unsteadiness on feet   . Community acquired pneumonia of left lower lobe of lung ([place]) 04/05/2016  . Cough 04/03/2016  . Rhinitis, non-allergic 06/23/2015  . Special screening examination for human papillomavirus (HPV) 07/20/2014  . Amenorrhea 05/19/2014  . 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 Long-term use of high-risk medication 12/24/2012  . Morbid obesity ([place]) 12/24/2012  . Congenital quadriplegia ([place]) 12/24/2012  . URINARY INCONTINENCE 02/25/2010  . Full incontinence of feces 02/25/2010  . DIGESTIVE SYSTEM COMPLICATION NEC 53/00/5110  . Hypothyroidism 01/29/2009  . 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 Infantile cerebral palsy ([place]) 01/29/2009  . GASTROESOPHAGEAL REFLUX DISEASE 01/29/2009  . Osteoporosis 01/29/2009  . Seizure disorder ([place]) 01/29/2009    Andrey Spearman, MS, OTR/L, [facility] 10/09/17 10:24 AM  [place] MAIN [facility] SERVICES [address] [place], [place], [address] Phone: [phone]   Fax:  [phone]  Name: Michele [name] MRN: [id number] Date of Birth: 01-16-1986

## 2017-10-11 ENCOUNTER — Encounter: Payer: Medicare Other | Admitting: Occupational Therapy

## 2017-10-12 ENCOUNTER — Other Ambulatory Visit: Payer: Self-pay | Admitting: Student in an Organized Health Care Education/Training Program

## 2017-10-15 ENCOUNTER — Encounter: Payer: Medicare Other | Admitting: Occupational Therapy

## 2017-10-16 ENCOUNTER — Encounter: Payer: Medicare Other | Admitting: Occupational Therapy

## 2017-10-18 ENCOUNTER — Encounter: Payer: Medicare Other | Admitting: Occupational Therapy

## 2017-10-22 ENCOUNTER — Encounter: Payer: Medicare Other | Admitting: Occupational Therapy

## 2017-10-23 ENCOUNTER — Encounter: Payer: Medicare Other | Admitting: Occupational Therapy

## 2017-10-25 ENCOUNTER — Other Ambulatory Visit: Payer: Self-pay | Admitting: Student in an Organized Health Care Education/Training Program

## 2017-10-25 ENCOUNTER — Encounter: Payer: Medicare Other | Admitting: Occupational Therapy

## 2017-10-25 DIAGNOSIS — R600 Localized edema: Secondary | ICD-10-CM

## 2017-10-29 ENCOUNTER — Other Ambulatory Visit: Payer: Self-pay | Admitting: Student in an Organized Health Care Education/Training Program

## 2017-11-05 ENCOUNTER — Emergency Department (HOSPITAL_COMMUNITY): Payer: Medicare Other

## 2017-11-05 ENCOUNTER — Emergency Department (HOSPITAL_COMMUNITY)
Admission: EM | Admit: 2017-11-05 | Discharge: 2017-11-05 | Disposition: A | Discharge status: Home / Self Care | Payer: Medicare Other | Attending: Emergency Medicine | Admitting: Emergency Medicine

## 2017-11-05 DIAGNOSIS — J189 Pneumonia, unspecified organism: Secondary | ICD-10-CM | POA: Diagnosis not present

## 2017-11-05 DIAGNOSIS — G40812 Lennox-Gastaut syndrome, not intractable, without status epilepticus: Secondary | ICD-10-CM | POA: Diagnosis not present

## 2017-11-05 DIAGNOSIS — E039 Hypothyroidism, unspecified: Secondary | ICD-10-CM | POA: Diagnosis not present

## 2017-11-05 DIAGNOSIS — G809 Cerebral palsy, unspecified: Secondary | ICD-10-CM | POA: Insufficient documentation

## 2017-11-05 DIAGNOSIS — R079 Chest pain, unspecified: Secondary | ICD-10-CM | POA: Diagnosis not present

## 2017-11-05 DIAGNOSIS — R569 Unspecified convulsions: Secondary | ICD-10-CM

## 2017-11-05 DIAGNOSIS — Z79899 Other long term (current) drug therapy: Secondary | ICD-10-CM | POA: Insufficient documentation

## 2017-11-05 LAB — CBC WITH DIFFERENTIAL/PLATELET
Basophils Absolute: 0 10*3/uL (ref 0.0–0.1)
Basophils Relative: 0 %
Eosinophils Absolute: 0 10*3/uL (ref 0.0–0.7)
Eosinophils Relative: 0 %
HEMATOCRIT: 44.3 % (ref 36.0–46.0)
HEMOGLOBIN: 13.5 g/dL (ref 12.0–15.0)
LYMPHS ABS: 0.8 10*3/uL (ref 0.7–4.0)
Lymphocytes Relative: 8 %
MCH: 31 pg (ref 26.0–34.0)
MCHC: 30.5 g/dL (ref 30.0–36.0)
MCV: 101.6 fL — ABNORMAL HIGH (ref 78.0–100.0)
MONOS PCT: 10 %
Monocytes Absolute: 1.1 10*3/uL — ABNORMAL HIGH (ref 0.1–1.0)
NEUTROS ABS: 8.8 10*3/uL — AB (ref 1.7–7.7)
NEUTROS PCT: 82 %
Platelets: 182 10*3/uL (ref 150–400)
RBC: 4.36 MIL/uL (ref 3.87–5.11)
RDW: 16.6 % — ABNORMAL HIGH (ref 11.5–15.5)
WBC: 10.8 10*3/uL — ABNORMAL HIGH (ref 4.0–10.5)

## 2017-11-05 LAB — COMPREHENSIVE METABOLIC PANEL
ALT: 19 U/L (ref 14–54)
AST: 29 U/L (ref 15–41)
Albumin: 3 g/dL — ABNORMAL LOW (ref 3.5–5.0)
Alkaline Phosphatase: 55 U/L (ref 38–126)
Anion gap: 8 (ref 5–15)
BILIRUBIN TOTAL: 0.2 mg/dL — AB (ref 0.3–1.2)
BUN: 13 mg/dL (ref 6–20)
CALCIUM: 9.2 mg/dL (ref 8.9–10.3)
CO2: 24 mmol/L (ref 22–32)
CREATININE: 0.87 mg/dL (ref 0.44–1.00)
Chloride: 112 mmol/L — ABNORMAL HIGH (ref 101–111)
GFR calc Af Amer: >60 mL/min (ref >60)
Glucose, Bld: 90 mg/dL (ref 65–99)
Potassium: 4.3 mmol/L (ref 3.5–5.1)
Sodium: 144 mmol/L (ref 135–145)
TOTAL PROTEIN: 7.3 g/dL (ref 6.5–8.1)

## 2017-11-05 LAB — URINALYSIS, ROUTINE W REFLEX MICROSCOPIC
BACTERIA UA: NONE SEEN
Bilirubin Urine: NEGATIVE
Glucose, UA: NEGATIVE mg/dL
Hgb urine dipstick: NEGATIVE
Ketones, ur: 5 mg/dL — AB
Nitrite: NEGATIVE
Protein, ur: 30 mg/dL — AB
Specific Gravity, Urine: 1.018 (ref 1.005–1.030)
pH: 9 — ABNORMAL HIGH (ref 5.0–8.0)

## 2017-11-05 LAB — VALPROIC ACID LEVEL: Valproic Acid Lvl: 60 ug/mL (ref 50.0–100.0)

## 2017-11-05 LAB — I-STAT CG4 LACTIC ACID, ED
LACTIC ACID, VENOUS: 2.51 mmol/L — AB (ref 0.5–1.9)
LACTIC ACID, VENOUS: 1.56 mmol/L (ref 0.5–1.9)

## 2017-11-05 MED ORDER — SODIUM CHLORIDE 0.9 % IV SOLN
1.0000 g | Freq: Once | INTRAVENOUS | Status: AC
  Administered 2017-11-05: 1 g via INTRAVENOUS
  Filled 2017-11-05: qty 10

## 2017-11-05 MED ORDER — AMOXICILLIN-POT CLAVULANATE 875-125 MG PO TABS: 1.0000 | ORAL_TABLET | Freq: Two times a day (BID) | ORAL | 0 refills | Status: DC

## 2017-11-05 MED ORDER — SODIUM CHLORIDE 0.9 % IV BOLUS
1000.0000 mL | Freq: Once | INTRAVENOUS | Status: AC
  Administered 2017-11-05: 1000 mL via INTRAVENOUS

## 2017-11-05 NOTE — Discharge Instructions (Addendum)
Follow-up with your doctors in the next few days.  Watch for worsening shortness of breath or decrease in her oral intake.  Watch for continued high fevers.  Watch for recurrent seizures.

## 2017-11-05 NOTE — ED Triage Notes (Signed)
Transported by [facility] from home--witnessed seizure this morning witnessed by caregiver. Lasted approximately 1 minute. Hx of seizures and CP. Patient is non-verbal but per EMS pt responds to painful stimuli. CBG read 111 mg/dl.

## 2017-11-05 NOTE — ED Provider Notes (Signed)
[facility]-EMERGENCY DEPT Provider Note   CSN: [id number] Arrival date & time: 11/05/17  7829     History   Chief Complaint Chief Complaint  Patient presents with  . Seizures    HPI Michele [name] is a 32 y.o. female. Level 5 caveat due to nonverbal status. HPI Patient presents with seizure. history of Lanoxin Gestalt syndrome.  Reportedly had tonic-clonic seizure at home.  Unable to provide much history.  Seizure reportedly witnessed by caregiver. Past Medical History:  Diagnosis Date  . Cerebral palsy (HCC)   . Hypothyroidism   . Lennox-Gastaut syndrome (HCC)   . Osteoporosis   . Pneumonia   . Profound mental retardation   . Reflux   . Rickets   . Seizures [facility]     Patient Active Problem List   Diagnosis Date Noted  . Lennox-Gastaut syndrome (HCC) 07/06/2017  . Acute decrease in level of consciousness 06/29/2017  . Lymphedema 01/22/2017  . HCAP (healthcare-associated pneumonia) 12/06/2016  . Arterial hypotension   . Sepsis (HCC) 11/04/2016  . Other constipation   . Bilateral lower extremity edema 04/21/2016  . Unsteadiness on feet   . Community acquired pneumonia of left lower lobe of lung (HCC) 04/05/2016  . Cough 04/03/2016  . Rhinitis, non-allergic 06/23/2015  . Special screening examination for human papillomavirus (HPV) 07/20/2014  . Amenorrhea 05/19/2014  . 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 Long-term use of high-risk medication 12/24/2012  . Morbid obesity (HCC) 12/24/2012  . 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 URINARY INCONTINENCE 02/25/2010  . Full incontinence of feces 02/25/2010  . 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 Hypothyroidism 01/29/2009  . 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 Infantile cerebral palsy (HCC) 01/29/2009  . GASTROESOPHAGEAL REFLUX DISEASE 01/29/2009  . Osteoporosis 01/29/2009  . Seizure disorder (HCC) 01/29/2009    Past  Surgical History:  Procedure Laterality Date  . FOOT MASS EXCISION Right   . Gastrocutaneous fistual repair  2012  . PEG TUBE PLACEMENT       OB History   None      Home Medications    Prior to Admission medications   Medication Sig Start Date End Date Taking? Authorizing Provider  acetaminophen (TYLENOL) 160 MG/5ML suspension Take 320 mg by mouth every 6 (six) hours as needed for mild pain.    Yes [provider]  BANZEL 40 MG/ML SUSP Take 5ml in the morning and 5ml at night 07/04/17  Yes Goodpasture, [name], NP  Calcium Carbonate-Vitamin D (CALCARB 600/D) 600-400 MG-UNIT per tablet Take 1 tablet by mouth daily. Only gives once a week.   Yes [provider]  clotrimazole (LOTRIMIN) 1 % cream APPLY EXTERNALLY TO THE AFFECTED AREA TWICE DAILY Patient taking differently: APPLY EXTERNALLY TO THE AFFECTED AREA TWICE DAILY PRN 01/12/16  Yes Gottschalk, Ashly M, DO  DEPAKOTE SPRINKLES 125 MG capsule TAKE 8 CAPSULES BY MOUTH TWICE DAILY 07/04/17  Yes Goodpasture, [name], NP  DIASTAT ACUDIAL 20 MG GEL DIAL TO 15 MG, GIVE RECTALLY FOR SEIZURES LASTING 2 MINUTES OR LONGER 07/04/17  Yes Elveria Rising, NP  feeding supplement, ENSURE ENLIVE, (ENSURE ENLIVE) LIQD Take 237 mLs by mouth 3 (three) times daily between meals. 04/11/16  Yes Beaulah Dinning, MD  fluticasone (FLONASE) 50 MCG/ACT nasal spray SHAKE LIQUID AND USE 2 SPRAYS IN EACH NOSTRIL DAILY 05/04/17  Yes Jeneen Rinks J, DO  furosemide (LASIX) 20 MG tablet TAKE 1 TABLET BY MOUTH DAILY AS NEEDED FOR FLUID RETENTION  10/26/17  Yes Howard Pouch, MD  hydrocortisone cream 0.5 % Apply 1 application topically 2 (two) times daily. 10/06/15  Yes Delynn Flavin M, DO  KEPPRA 100 MG/ML solution Take 12ml by mouth every morning and 12ml by mouth at night 07/04/17  Yes Goodpasture, [name], NP  lactulose (CHRONULAC) 10 GM/15ML solution TAKE 30 ML BY MOUTH TWICE DAILY 10/29/17  Yes Howard Pouch, MD  levOCARNitine (CARNITOR) 1 GM/10ML solution  TAKE 5 MLS BY MOUTH THREE TIMES DAILY 09/27/17  Yes Elveria Rising, NP  liver oil-zinc oxide (DESITIN) 40 % ointment Apply 1 application topically daily.   Yes [provider]  nystatin (NYSTATIN) powder APPLY TO AFFECTED DIAPER AREAS WITH EVERY DIAPER CHANGE UNTIL RASH CLEARS 05/29/17  Yes Howard Pouch, MD  ondansetron (ZOFRAN) 4 MG/5ML solution TAKE 5 MLS FOR PAIN EVERY 8 HOURS AS NEEDED FOR NAUSEA OR VOMITING 06/29/14  Yes Hess, Bryan R, DO  pantoprazole sodium (PROTONIX) 40 mg/20 mL PACK PLACE 10 MLS INTO THE FEEDING TUBE DAILY Patient taking differently: Take 40 mg by mouth daily.  10/08/17  Yes Howard Pouch, MD  TOPAMAX SPRINKLE 25 MG capsule TAKE 6 CAPSULES BY MOUTH IN THE MORNING AND 6 CAPSULES BY MOUTH IN THE EVENING WITH FOOD 07/04/17  Yes Elveria Rising, NP  amoxicillin-clavulanate (AUGMENTIN) 875-125 MG tablet Take 1 tablet by mouth every 12 (twelve) hours. 11/05/17   Benjiman Core, MD  oseltamivir (TAMIFLU) 75 MG capsule Take 1 capsule (75 mg total) by mouth every 12 (twelve) hours. Patient not taking: Reported on 07/04/2017 06/29/17   Cathren Laine, MD    Family History Family History  Problem Relation Age of Onset  . Hypertension Mother   . Heart disease Maternal Grandmother        Died at 10  . Stroke Maternal Grandfather        died at 80  . Hypertension Paternal Grandmother   . Diabetes Unknown        MGGM  . Heart disease Unknown        MGGF  . Kidney disease Unknown        Maternal Great Aunt   . Cancer Maternal Aunt        lung  . Colon cancer Neg Hx     Social History Social History   Tobacco Use  . Smoking status: Never Smoker  . Smokeless tobacco: Never Used  Substance Use Topics  . Alcohol use: No  . Drug use: No     Allergies   Carbamazepine   Review of Systems Review of Systems  Unable to perform ROS: Patient nonverbal     Physical Exam Updated Vital Signs BP 117/72   Pulse (!) 115   Temp (!) 101.1 F (38.4 C) (Rectal)    Resp (!) 31   SpO2 100%   Physical Exam  Constitutional: She appears well-developed.  HENT:  Head: Atraumatic.  Eyes: Pupils are equal, round, and reactive to light.  Neck: Neck supple.  Cardiovascular:  Tachycardia  Pulmonary/Chest:  Mildly harsh breath sounds with cough.  Abdominal: There is no tenderness.  Musculoskeletal: She exhibits edema.  Edema bilateral lower extremities.Is a  Neurological:  Patient will respond to pain.  Breathing spontaneously.  Moving extremities.  Skin: Skin is warm.     ED Treatments / Results  Labs (all labs ordered are listed, but only abnormal results are displayed) Labs Reviewed  CBC WITH DIFFERENTIAL/PLATELET - Abnormal; Notable for the following components:      Result Value  WBC 10.8 (*)    MCV 101.6 (*)    RDW 16.6 (*)    Neutro Abs 8.8 (*)    Monocytes Absolute 1.1 (*)    All other components within normal limits  URINALYSIS, ROUTINE W REFLEX MICROSCOPIC - Abnormal; Notable for the following components:   pH 9.0 (*)    Ketones, ur 5 (*)    Protein, ur 30 (*)    Leukocytes, UA TRACE (*)    All other components within normal limits  COMPREHENSIVE METABOLIC PANEL - Abnormal; Notable for the following components:   Chloride 112 (*)    Albumin 3.0 (*)    Total Bilirubin 0.2 (*)    All other components within normal limits  I-STAT CG4 LACTIC ACID, ED - Abnormal; Notable for the following components:   Lactic Acid, Venous 2.51 (*)    All other components within normal limits  VALPROIC ACID LEVEL  I-STAT CG4 LACTIC ACID, ED    EKG None  Radiology Dg Chest Portable 1 View  Result Date: 11/05/2017 CLINICAL DATA:  Seizure. EXAM: PORTABLE CHEST 1 VIEW COMPARISON:  06/29/2017 FINDINGS: The cardiac silhouette, mediastinal and hilar contours are stable. Increased retrocardiac density on the left suspicious for infiltrate. No pleural effusion. The bony thorax is intact. IMPRESSION: Suspect left lower lobe infiltrate. Electronically  Signed   By: Rudie Meyer M.D.   On: 11/05/2017 09:38    Procedures Procedures (including critical care time)  Medications Ordered in ED Medications  cefTRIAXone (ROCEPHIN) 1 g in sodium chloride 0.9 % 100 mL IVPB (0 g Intravenous Stopped 11/05/17 1149)  sodium chloride 0.9 % bolus 1,000 mL (1,000 mLs Intravenous New Bag/Given 11/05/17 1128)     Initial Impression / Assessment and Plan / ED Course  I have reviewed the triage vital signs and the nursing notes.  Pertinent labs & imaging results that were available during my care of the patient were reviewed by me and considered in my medical decision making (see chart for details).     Patient with breakthrough seizure and fever.  Labs reassuring overall but found to have pneumonia on x-ray.  Discussed with patient's family.  They are very involved in her medical care feels if she can be managed at home.  They have history of treating her.  Will discharge with antibiotics.  Has been given IV antibiotics here.  Will return for worsening symptoms.  Family was also informed of the risk of increased breakthrough seizures.  Follow-up with her PCP in the next couple days.  Discharge home. 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

## 2017-11-05 NOTE — ED Notes (Signed)
[facility] has been contacted regarding patient transport.  

## 2017-11-05 NOTE — ED Notes (Signed)
Bed: WA18 Expected date:  Expected time:  Means of arrival:  Comments: EMS-SZ 

## 2017-11-09 ENCOUNTER — Inpatient Hospital Stay (HOSPITAL_COMMUNITY)
Admission: EM | Admit: 2017-11-09 | Discharge: 2017-11-12 | DRG: 194 | Disposition: A | Discharge status: Home / Self Care | Payer: Medicare Other | Attending: Family Medicine | Admitting: Family Medicine

## 2017-11-09 ENCOUNTER — Emergency Department (HOSPITAL_COMMUNITY): Payer: Medicare Other

## 2017-11-09 ENCOUNTER — Encounter (HOSPITAL_COMMUNITY): Payer: Self-pay | Admitting: Pharmacy Technician

## 2017-11-09 DIAGNOSIS — Z6841 Body Mass Index (BMI) 40.0 and over, adult: Secondary | ICD-10-CM | POA: Diagnosis not present

## 2017-11-09 DIAGNOSIS — G40812 Lennox-Gastaut syndrome, not intractable, without status epilepticus: Secondary | ICD-10-CM | POA: Diagnosis not present

## 2017-11-09 DIAGNOSIS — R0902 Hypoxemia: Secondary | ICD-10-CM | POA: Diagnosis not present

## 2017-11-09 DIAGNOSIS — G808 Other cerebral palsy: Secondary | ICD-10-CM | POA: Diagnosis not present

## 2017-11-09 DIAGNOSIS — B379 Candidiasis, unspecified: Secondary | ICD-10-CM | POA: Diagnosis not present

## 2017-11-09 DIAGNOSIS — G809 Cerebral palsy, unspecified: Secondary | ICD-10-CM

## 2017-11-09 DIAGNOSIS — J189 Pneumonia, unspecified organism: Secondary | ICD-10-CM | POA: Diagnosis not present

## 2017-11-09 DIAGNOSIS — R509 Fever, unspecified: Secondary | ICD-10-CM | POA: Diagnosis not present

## 2017-11-09 DIAGNOSIS — E039 Hypothyroidism, unspecified: Secondary | ICD-10-CM | POA: Diagnosis present

## 2017-11-09 DIAGNOSIS — F73 Profound intellectual disabilities: Secondary | ICD-10-CM | POA: Diagnosis not present

## 2017-11-09 DIAGNOSIS — K219 Gastro-esophageal reflux disease without esophagitis: Secondary | ICD-10-CM | POA: Diagnosis present

## 2017-11-09 DIAGNOSIS — Z79899 Other long term (current) drug therapy: Secondary | ICD-10-CM

## 2017-11-09 DIAGNOSIS — R05 Cough: Secondary | ICD-10-CM | POA: Diagnosis not present

## 2017-11-09 DIAGNOSIS — M810 Age-related osteoporosis without current pathological fracture: Secondary | ICD-10-CM | POA: Diagnosis present

## 2017-11-09 DIAGNOSIS — R402441 Other coma, without documented Glasgow coma scale score, or with partial score reported, in the field [EMT or ambulance]: Secondary | ICD-10-CM | POA: Diagnosis not present

## 2017-11-09 LAB — I-STAT CHEM 8, ED
BUN: 10 mg/dL (ref 6–20)
CALCIUM ION: 1.19 mmol/L (ref 1.15–1.40)
CHLORIDE: 110 mmol/L (ref 101–111)
CREATININE: 0.8 mg/dL (ref 0.44–1.00)
Glucose, Bld: 88 mg/dL (ref 65–99)
HEMATOCRIT: 37 % (ref 36.0–46.0)
Hemoglobin: 12.6 g/dL (ref 12.0–15.0)
Potassium: 3.8 mmol/L (ref 3.5–5.1)
Sodium: 143 mmol/L (ref 135–145)
TCO2: 23 mmol/L (ref 22–32)

## 2017-11-09 LAB — CBC WITH DIFFERENTIAL/PLATELET
ABS IMMATURE GRANULOCYTES: 0.2 10*3/uL — AB (ref 0.0–0.1)
Basophils Absolute: 0 10*3/uL (ref 0.0–0.1)
Basophils Relative: 0 %
Eosinophils Absolute: 0 10*3/uL (ref 0.0–0.7)
Eosinophils Relative: 0 %
HEMATOCRIT: 39.4 % (ref 36.0–46.0)
Hemoglobin: 11.6 g/dL — ABNORMAL LOW (ref 12.0–15.0)
IMMATURE GRANULOCYTES: 2 %
LYMPHS ABS: 1.4 10*3/uL (ref 0.7–4.0)
Lymphocytes Relative: 12 %
MCH: 29.8 pg (ref 26.0–34.0)
MCHC: 29.4 g/dL — ABNORMAL LOW (ref 30.0–36.0)
MCV: 101.3 fL — AB (ref 78.0–100.0)
MONO ABS: 1.6 10*3/uL — AB (ref 0.1–1.0)
MONOS PCT: 14 %
NEUTROS ABS: 8.6 10*3/uL — AB (ref 1.7–7.7)
Neutrophils Relative %: 72 %
PLATELETS: 199 10*3/uL (ref 150–400)
RBC: 3.89 MIL/uL (ref 3.87–5.11)
RDW: 17.1 % — ABNORMAL HIGH (ref 11.5–15.5)
WBC: 11.9 10*3/uL — ABNORMAL HIGH (ref 4.0–10.5)

## 2017-11-09 LAB — I-STAT CG4 LACTIC ACID, ED: Lactic Acid, Venous: 0.87 mmol/L (ref 0.5–1.9)

## 2017-11-09 MED ORDER — DIVALPROEX SODIUM 125 MG PO CSDR: 1000.0000 mg | DELAYED_RELEASE_CAPSULE | Freq: Two times a day (BID) | ORAL | Status: DC

## 2017-11-09 MED ORDER — GUAIFENESIN 100 MG/5ML PO LIQD
200.0000 mg | Freq: Four times a day (QID) | ORAL | Status: DC | PRN
  Filled 2017-11-09: qty 10

## 2017-11-09 MED ORDER — ACETAMINOPHEN 160 MG/5ML PO SOLN: 650.0000 mg | Freq: Four times a day (QID) | ORAL | Status: DC | PRN

## 2017-11-09 MED ORDER — SODIUM CHLORIDE 0.9 % IV SOLN
1.0000 g | INTRAVENOUS | Status: DC
  Administered 2017-11-10 – 2017-11-11 (×2): 1 g via INTRAVENOUS
  Filled 2017-11-09 (×2): qty 10

## 2017-11-09 MED ORDER — TOPIRAMATE 25 MG PO CPSP
150.0000 mg | ORAL_CAPSULE | Freq: Two times a day (BID) | ORAL | Status: DC
  Filled 2017-11-09: qty 6

## 2017-11-09 MED ORDER — TOPIRAMATE 25 MG PO CPSP: 150.0000 mg | ORAL_CAPSULE | Freq: Two times a day (BID) | ORAL | Status: DC

## 2017-11-09 MED ORDER — LEVOCARNITINE 1 GM/10ML PO SOLN
500.0000 mg | Freq: Three times a day (TID) | ORAL | Status: DC
  Administered 2017-11-09 – 2017-11-12 (×8): 500 mg via ORAL
  Filled 2017-11-09 (×11): qty 5

## 2017-11-09 MED ORDER — MICONAZOLE NITRATE 1200 & 2 MG & % VA KIT: 1.0000 | PACK | Freq: Every day | VAGINAL | Status: DC

## 2017-11-09 MED ORDER — TOPIRAMATE 25 MG PO CPSP
150.0000 mg | ORAL_CAPSULE | Freq: Two times a day (BID) | ORAL | Status: DC
  Administered 2017-11-10 – 2017-11-12 (×6): 150 mg via ORAL
  Filled 2017-11-09 (×6): qty 6

## 2017-11-09 MED ORDER — ENOXAPARIN SODIUM 40 MG/0.4ML ~~LOC~~ SOLN
40.0000 mg | SUBCUTANEOUS | Status: DC
  Administered 2017-11-09 – 2017-11-11 (×3): 40 mg via SUBCUTANEOUS
  Filled 2017-11-09 (×3): qty 0.4

## 2017-11-09 MED ORDER — NYSTATIN 100000 UNIT/GM EX POWD
Freq: Three times a day (TID) | CUTANEOUS | Status: DC
  Administered 2017-11-09 – 2017-11-12 (×8): via TOPICAL
  Filled 2017-11-09 (×3): qty 15

## 2017-11-09 MED ORDER — LEVETIRACETAM 100 MG/ML PO SOLN
1200.0000 mg | Freq: Two times a day (BID) | ORAL | Status: DC
  Administered 2017-11-10 – 2017-11-12 (×6): 1200 mg via ORAL
  Filled 2017-11-09 (×11): qty 15

## 2017-11-09 MED ORDER — SODIUM CHLORIDE 0.9 % IV SOLN
500.0000 mg | INTRAVENOUS | Status: DC
  Administered 2017-11-10 – 2017-11-11 (×2): 500 mg via INTRAVENOUS
  Filled 2017-11-09 (×2): qty 500

## 2017-11-09 MED ORDER — LEVOCARNITINE 1 GM/10ML PO SOLN: 500.0000 mg | Freq: Three times a day (TID) | ORAL | Status: DC

## 2017-11-09 MED ORDER — FLUCONAZOLE 40 MG/ML PO SUSR
150.0000 mg | Freq: Once | ORAL | Status: AC
  Administered 2017-11-09: 152 mg via ORAL
  Filled 2017-11-09: qty 3.8

## 2017-11-09 MED ORDER — SODIUM CHLORIDE 0.9 % IV SOLN
500.0000 mg | Freq: Once | INTRAVENOUS | Status: AC
  Administered 2017-11-09: 500 mg via INTRAVENOUS
  Filled 2017-11-09: qty 500

## 2017-11-09 MED ORDER — ACETAMINOPHEN 325 MG PO TABS: 650.0000 mg | ORAL_TABLET | Freq: Once | ORAL | Status: DC

## 2017-11-09 MED ORDER — FLUTICASONE PROPIONATE 50 MCG/ACT NA SUSP
2.0000 | Freq: Every day | NASAL | Status: DC
  Administered 2017-11-10 – 2017-11-12 (×3): 2 via NASAL
  Filled 2017-11-09: qty 16

## 2017-11-09 MED ORDER — RUFINAMIDE 40 MG/ML PO SUSP
5.0000 mL | Freq: Two times a day (BID) | ORAL | Status: DC
  Administered 2017-11-09 – 2017-11-12 (×6): 5 mL via ORAL
  Filled 2017-11-09 (×7): qty 5

## 2017-11-09 MED ORDER — ENSURE ENLIVE PO LIQD
237.0000 mL | Freq: Three times a day (TID) | ORAL | Status: DC
  Administered 2017-11-09 – 2017-11-12 (×8): 237 mL via ORAL

## 2017-11-09 MED ORDER — ACETAMINOPHEN 650 MG RE SUPP
650.0000 mg | Freq: Once | RECTAL | Status: AC
  Administered 2017-11-09: 650 mg via RECTAL
  Filled 2017-11-09: qty 1

## 2017-11-09 MED ORDER — CLOTRIMAZOLE 1 % VA CREA
1.0000 | TOPICAL_CREAM | Freq: Every day | VAGINAL | Status: DC
  Administered 2017-11-11: 1 via VAGINAL
  Filled 2017-11-09: qty 45

## 2017-11-09 MED ORDER — LACTULOSE 10 GM/15ML PO SOLN
30.0000 g | Freq: Two times a day (BID) | ORAL | Status: DC
  Administered 2017-11-09 – 2017-11-11 (×4): 30 g via ORAL
  Filled 2017-11-09 (×7): qty 45

## 2017-11-09 MED ORDER — SODIUM CHLORIDE 0.9 % IV SOLN
1.0000 g | Freq: Once | INTRAVENOUS | Status: AC
  Administered 2017-11-09: 1 g via INTRAVENOUS
  Filled 2017-11-09: qty 10

## 2017-11-09 MED ORDER — DIVALPROEX SODIUM 125 MG PO CSDR
1000.0000 mg | DELAYED_RELEASE_CAPSULE | Freq: Two times a day (BID) | ORAL | Status: DC
  Filled 2017-11-09 (×2): qty 8

## 2017-11-09 MED ORDER — RUFINAMIDE 40 MG/ML PO SUSP: 5.0000 mL | Freq: Two times a day (BID) | ORAL | Status: DC

## 2017-11-09 MED ORDER — TOPIRAMATE 25 MG PO CPSP
150.0000 mg | ORAL_CAPSULE | Freq: Two times a day (BID) | ORAL | Status: DC
  Filled 2017-11-09 (×4): qty 6

## 2017-11-09 MED ORDER — LEVETIRACETAM 100 MG/ML PO SOLN: 1200.0000 mg | Freq: Two times a day (BID) | ORAL | Status: DC

## 2017-11-09 MED ORDER — FUROSEMIDE 20 MG PO TABS
20.0000 mg | ORAL_TABLET | Freq: Every day | ORAL | Status: DC
  Administered 2017-11-09: 20 mg via ORAL
  Filled 2017-11-09: qty 1

## 2017-11-09 MED ORDER — PANTOPRAZOLE SODIUM 40 MG PO PACK
40.0000 mg | PACK | Freq: Every day | ORAL | Status: DC
  Administered 2017-11-09 – 2017-11-12 (×4): 40 mg
  Filled 2017-11-09 (×4): qty 20

## 2017-11-09 NOTE — Progress Notes (Signed)
Michele [name] [id number] Admission Data: 11/09/2017 6:40 PM Attending Provider: Tobey Grim, MD  [id number], Leotis Shames, MD Consults/ Treatment Team:   Michele [name] is a 32 y.o. female patient admitted from ED awake, alert  & orientated  X 3,  Full Code, VSS - Blood pressure (!) 104/91, pulse (!) 103, temperature 98.6 F (37 C), temperature source Axillary, resp. rate 20, SpO2 90 %.,  no c/o shortness of breath, no c/o chest pain, no distress noted.  IV site WDL:  upper arm left, condition patent and no redness with a transparent dsg that's clean dry and intact.  Allergies:   Allergies  Allergen Reactions  . Carbamazepine Rash     Past Medical History:  Diagnosis Date  . Cerebral palsy (HCC)   . Hypothyroidism   . Lennox-Gastaut syndrome (HCC)   . Osteoporosis   . Pneumonia   . Profound mental retardation   . Reflux   . Rickets   . Seizures (HCC)     History:  obtained from mother. Tobacco/alcohol: denied none  Pt orientation to unit, room and routine. Information packet given to patient/family and safety video watched.  Admission INP armband ID verified with patient/family, and in place. SR up x 2, fall risk assessment complete with Patient and family verbalizing understanding of risks associated with falls. Pt verbalizes an understanding of how to use the call bell and to call for help before getting out of bed.  Skin, clean-dry- intact without evidence of bruising, or skin tears.   No evidence of skin break down noted on exam. no rashes, no ecchymoses, no petechiae, no nodules, no jaundice, no purpura, no wounds    Will cont to monitor and assist as needed.  Camillo Flaming, RN 11/09/2017 6:40 PM

## 2017-11-09 NOTE — ED Provider Notes (Signed)
[facility] EMERGENCY DEPARTMENT Provider Note   CSN: [id number] Arrival date & time: 11/09/17  2440     History   Chief Complaint Chief Complaint  Patient presents with  . Pneumonia  level V caveat patient mentally disabled. History is obtained from patient's mother and from  hospital records  HPI Michele [name] is a 32 y.o. female.patient has been coughing for the past 2 days. She was seen [facility] emergency department yesterday and diagnosed with pneumonia. Started on Augmentin. This morning she was less responsive. Had temperature 102. EMS was called.EMS treated patient with supplemental oxygen. Patient became more responsive.mother reports her last seizure was 4 days ago.  HPI  Past Medical History:  Diagnosis Date  . Cerebral palsy (HCC)   . Hypothyroidism   . Lennox-Gastaut syndrome (HCC)   . Osteoporosis   . Pneumonia   . Profound mental retardation   . Reflux   . Rickets   . Seizures [facility])     Patient Active Problem List   Diagnosis Date Noted  . Lennox-Gastaut syndrome (HCC) 07/06/2017  . Acute decrease in level of consciousness 06/29/2017  . Lymphedema 01/22/2017  . HCAP (healthcare-associated pneumonia) 12/06/2016  . Arterial hypotension   . Sepsis (HCC) 11/04/2016  . Other constipation   . Bilateral lower extremity edema 04/21/2016  . Unsteadiness on feet   . Community acquired pneumonia of left lower lobe of lung (HCC) 04/05/2016  . Cough 04/03/2016  . Rhinitis, non-allergic 06/23/2015  . Special screening examination for human papillomavirus (HPV) 07/20/2014  . Amenorrhea 05/19/2014  . 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 Long-term use of high-risk medication 12/24/2012  . Morbid obesity (HCC) 12/24/2012  . 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 URINARY INCONTINENCE 02/25/2010  . Full incontinence of feces 02/25/2010  . 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 Hypothyroidism  01/29/2009  . 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 Infantile cerebral palsy (HCC) 01/29/2009  . GASTROESOPHAGEAL REFLUX DISEASE 01/29/2009  . Osteoporosis 01/29/2009  . Seizure disorder (HCC) 01/29/2009    Past Surgical History:  Procedure Laterality Date  . FOOT MASS EXCISION Right   . Gastrocutaneous fistual repair  2012  . PEG TUBE PLACEMENT       OB History   None      Home Medications    Prior to Admission medications   Medication Sig Start Date End Date Taking? Authorizing Provider  acetaminophen (TYLENOL) 160 MG/5ML suspension Take 320 mg by mouth every 6 (six) hours as needed for mild pain.     [provider]  amoxicillin-clavulanate (AUGMENTIN) 875-125 MG tablet Take 1 tablet by mouth every 12 (twelve) hours. 11/05/17   Benjiman Core, MD  BANZEL 40 MG/ML SUSP Take 5ml in the morning and 5ml at night 07/04/17   Elveria Rising, NP  Calcium Carbonate-Vitamin D (CALCARB 600/D) 600-400 MG-UNIT tablet Take 1 tablet by mouth daily. Only gives once a week.    [provider]  clotrimazole (LOTRIMIN) 1 % cream APPLY EXTERNALLY TO THE AFFECTED AREA TWICE DAILY Patient taking differently: APPLY EXTERNALLY TO THE AFFECTED AREA TWICE DAILY PRN 01/12/16   Delynn Flavin M, DO  DEPAKOTE SPRINKLES 125 MG capsule TAKE 8 CAPSULES BY MOUTH TWICE DAILY 07/04/17   Elveria Rising, NP  DIASTAT ACUDIAL 20 MG GEL DIAL TO 15 MG, GIVE RECTALLY FOR SEIZURES LASTING 2 MINUTES OR LONGER 07/04/17   Elveria Rising, NP  feeding supplement, ENSURE ENLIVE, (ENSURE ENLIVE) LIQD Take 237 mLs  by mouth 3 (three) times daily between meals. 04/11/16   Beaulah Dinning, MD  fluticasone (FLONASE) 50 MCG/ACT nasal spray SHAKE LIQUID AND USE 2 SPRAYS IN [facility] NOSTRIL DAILY 05/04/17   Leland Her, DO  furosemide (LASIX) 20 MG tablet TAKE 1 TABLET BY MOUTH DAILY AS NEEDED FOR FLUID RETENTION 10/26/17   Howard Pouch, MD  hydrocortisone cream 0.5 % Apply 1 application  topically 2 (two) times daily. 10/06/15   Raliegh Ip, DO  KEPPRA 100 MG/ML solution Take 12ml by mouth every morning and 12ml by mouth at night 07/04/17   Elveria Rising, NP  lactulose (CHRONULAC) 10 GM/15ML solution TAKE 30 ML BY MOUTH TWICE DAILY 10/29/17   Howard Pouch, MD  levOCARNitine (CARNITOR) 1 GM/10ML solution TAKE 5 MLS BY MOUTH THREE TIMES DAILY 09/27/17   Elveria Rising, NP  liver oil-zinc oxide (DESITIN) 40 % ointment Apply 1 application topically daily.    [provider]  nystatin (NYSTATIN) powder APPLY TO AFFECTED DIAPER AREAS WITH EVERY DIAPER CHANGE UNTIL RASH CLEARS 05/29/17   Howard Pouch, MD  ondansetron [facility]) 4 MG/5ML solution TAKE 5 MLS FOR PAIN EVERY 8 HOURS AS NEEDED FOR NAUSEA OR VOMITING 06/29/14   Hess, Twana First, DO  oseltamivir (TAMIFLU) 75 MG capsule Take 1 capsule (75 mg total) by mouth every 12 (twelve) hours. Patient not taking: Reported on 07/04/2017 06/29/17   Cathren Laine, MD  pantoprazole sodium (PROTONIX) 40 mg/20 mL PACK PLACE 10 MLS INTO THE FEEDING TUBE DAILY Patient taking differently: Take 40 mg by mouth daily.  10/08/17   Howard Pouch, MD  TOPAMAX SPRINKLE 25 MG capsule TAKE 6 CAPSULES BY MOUTH IN THE MORNING AND 6 CAPSULES BY MOUTH IN THE EVENING WITH FOOD 07/04/17   Elveria Rising, NP    Family History Family History  Problem Relation Age of Onset  . Hypertension Mother   . Heart disease Maternal Grandmother        Died at 63  . Stroke Maternal Grandfather        died at 58  . Hypertension Paternal Grandmother   . Diabetes Unknown        MGGM  . Heart disease Unknown        MGGF  . Kidney disease Unknown        Maternal Great Aunt   . Cancer Maternal Aunt        lung  . Colon cancer Neg Hx     Social History Social History   Tobacco Use  . Smoking status: Never Smoker  . Smokeless tobacco: Never Used  Substance Use Topics  . Alcohol use: No  . Drug use: No     Allergies   Carbamazepine   Review of  Systems Review of Systems  Unable to perform ROS: Other  Constitutional: Positive for fever.  Respiratory: Positive for cough.    Unable to obtain complete review of systems. Patient mentally disabled  Physical Exam Updated Vital Signs There were no vitals taken for this visit.  Physical Exam  Constitutional: No distress.  HENT:  Head: Normocephalic and atraumatic.  Eyes: Pupils are equal, round, and reactive to light. Conjunctivae are normal.  Neck: Neck supple. No tracheal deviation present. No thyromegaly present.  Cardiovascular: Regular rhythm.  No murmur heard. tachycardic  Pulmonary/Chest: Effort normal and breath sounds normal.  Abdominal: Soft. Bowel sounds are normal. She exhibits no distension. There is no tenderness.  obese  Musculoskeletal: Normal range of motion. She exhibits no edema  or tenderness.  Neurological: She is alert. Coordination normal.  Skin: Skin is warm and dry. No rash noted.  Psychiatric: She has a normal mood and affect.  Nursing note and vitals reviewed.    ED Treatments / Results  Labs (all labs ordered are listed, but only abnormal results are displayed) Labs Reviewed  CULTURE, BLOOD (ROUTINE X 2)  CULTURE, BLOOD (ROUTINE X 2)  CBC WITH DIFFERENTIAL/PLATELET  I-STAT CHEM 8, ED  I-STAT CG4 LACTIC ACID, ED    EKG None  Radiology No results found.  Procedures Procedures (including critical care time) Results for orders placed or performed during the hospital encounter of 11/09/17  I-stat chem 8, ed  Result Value Ref Range   Sodium 143 135 - 145 mmol/L   Potassium 3.8 3.5 - 5.1 mmol/L   Chloride 110 101 - 111 mmol/L   BUN 10 6 - 20 mg/dL   Creatinine, Ser 4.09 0.44 - 1.00 mg/dL   Glucose, Bld 88 65 - 99 mg/dL   Calcium, Ion 8.11 9.14 - 1.40 mmol/L   TCO2 23 22 - 32 mmol/L   Hemoglobin 12.6 12.0 - 15.0 g/dL   HCT 78.2 95.6 - 21.3 %  I-Stat CG4 Lactic Acid, ED  Result Value Ref Range   Lactic Acid, Venous 0.87 0.5 - 1.9  mmol/L   Dg Chest Port 1 View  Result Date: 11/09/2017 CLINICAL DATA:  Cough and fever EXAM: PORTABLE CHEST 1 VIEW COMPARISON:  11/05/2017 FINDINGS: Cardiac shadow is stable. The lungs are hypoinflated but clear. No bony abnormality is seen. IMPRESSION: Poor inspiratory effort without focal infiltrate. Electronically Signed   By: Alcide Clever M.D.   On: 11/09/2017 08:54   Dg Chest Portable 1 View  Result Date: 11/05/2017 CLINICAL DATA:  Seizure. EXAM: PORTABLE CHEST 1 VIEW COMPARISON:  06/29/2017 FINDINGS: The cardiac silhouette, mediastinal and hilar contours are stable. Increased retrocardiac density on the left suspicious for infiltrate. No pleural effusion. The bony thorax is intact. IMPRESSION: Suspect left lower lobe infiltrate. Electronically Signed   By: Rudie Meyer M.D.   On: 11/05/2017 09:38   Medications Ordered in ED Medications  cefTRIAXone (ROCEPHIN) 1 g in sodium chloride 0.9 % 100 mL IVPB (has no administration in time range)  azithromycin (ZITHROMAX) 500 mg in sodium chloride 0.9 % 250 mL IVPB (has no administration in time range)     Initial Impression / Assessment and Plan / ED Course  I have reviewed the triage vital signs and the nursing notes.  Pertinent labs & imaging results that were available during my care of the patient were reviewed by me and considered in my medical decision making (see chart for details).     Pulse oximetry on room air 90%, consistent with mild hypoxemia. Patient placed on oxygen 2 L nasal cannula pulse oximetry and oxygen 2 L 9%, normal chest x-ray viewed by me. In light of cough, fever, we'll treat for community acquired pneumonia.IV antibiotics, patient has oxygen requirement In light of oxygen requirement willarrange for overnight. IV antibiotics. Supplemental oxygen.Family medicine service consulted , will arrange for overnight stay. Lab work remarkable for normal electrolytes Final Clinical Impressions(s) / ED Diagnoses  Diagnosis  #1 community acquired pneumonia #2 fever Final diagnoses:  None  #3 hypoxia  ED Discharge Orders    None       Doug Sou, MD 11/09/17 1013

## 2017-11-09 NOTE — H&P (Signed)
[facility] Admission History and Physical Service Pager: [phone]  Patient name: Michele [name] Medical record number: [id number] Date of birth: July 23, 1985 Age: 32 y.o. Gender: female  Primary Care Provider: Everrett Coombe, MD Consultants: None  Code Status: Full   Chief Complaint: Cough   Assessment and Plan: Michele [name] is a 32 y.o. female presenting by EMS with cough and decreased responsiveness, recently diagnoses with CAP. PMH is significant for infantile CP, intellectual disability, seizure disorder, GERD.   CAP: Patient seen in [facility] ED for cough and fever. CXR on 5/20 was concerning for LLL PNA. She was started on Augmentin. Presented today by EMS for decreased responsiveness at home. Required NRB with EMS. Is now more alert and requiring 2L [place] to maintain appropriate O2 saturations. Temperature of 101 degrees with tachycardia and increased RR to 25. CXR obtained today was poor quality due to body habitus and poor inspiratory effort. LA WNL. Isat Chem 8 unremarkable.  -admit to MedSurg, vital signs per unit with continuous pulse ox  -supplemental O2 prn, wean as tolerated  -continue CTX and Azithromycin for CAP (5/24- ); transition to PO abx as tolerated -CBC pending  -blood cultures pending  -aspiration precautions   Infantile cerebral palsy/intellectual disability:completely dependent upon mother for ADLs. Lower extremity edema, non-pitting. Takes Lasix 20 mg daily prn.  - OOB with assistance only -lasix 20 mg daily    Seizure disorder: takes Depakote 1019m BID. Keppra 12067mBID. Topamax 15023mID, Carnitor 0.5mh29mD, Banzel susp 5mg 61m. Diastat prn rectal.  - continue home regimen   GERD: on Protonix 10cc daily  - Continue Protonix    FEN/GI: Heart Healthy Diet  Prophylaxis: Lovenox   Disposition: Admit to FPTS; Attending [place]   History of Present Illness:  Michele [name] 32 y.57 female presenting by EMS for decreased  responsiveness. Found to have hypoxia on RA. [facility]. Started on Augmentin, mom believes she has received 3 days of therapy. Has continued to have cough and fever at home. Now back to her normal self in terms of responsiveness and much more "spunky". She has not been eating as much as usual but has continued to drink lots of fluids.   Review Of Systems: Per HPI with the following additions:   Review of Systems  Constitutional: Positive for fever and malaise/fatigue.  Respiratory: Positive for cough. Negative for hemoptysis, shortness of breath and wheezing.   Gastrointestinal: Negative for abdominal pain, constipation, diarrhea and vomiting.  Genitourinary: Negative for hematuria.  Skin: Negative for rash.    Patient Active Problem List   Diagnosis Date Noted  . Lennox-Gastaut syndrome (HCC) [place]18/2019  . Acute decrease in level of consciousness 06/29/2017  . Lymphedema 01/22/2017  . HCAP (healthcare-associated pneumonia) 12/06/2016  . Arterial hypotension   . Sepsis (HCC) [place]19/2018  . Other constipation   . Bilateral lower extremity edema 04/21/2016  . Unsteadiness on feet   . Community acquired pneumonia of left lower lobe of lung (HCC) [place]18/2017  . Cough 04/03/2016  . Rhinitis, non-allergic 06/23/2015  . Special screening examination for human papillomavirus (HPV) 07/20/2014  . Amenorrhea 05/19/2014  . 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 Long-term use of high-risk medication 12/24/2012  . Morbid obesity (HCC) [place]08/2014  . Congenital quadriplegia (HCC) [place]08/2014  . URINARY INCONTINENCE 02/25/2010  . Full incontinence of feces 02/25/2010  . DIGESTIVE SYSTEM COMPLICATION NEC 10/3114/24/4695  Hypothyroidism 01/29/2009  . 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 Infantile cerebral palsy ([place]) 01/29/2009  . GASTROESOPHAGEAL REFLUX DISEASE 01/29/2009  . Osteoporosis 01/29/2009  . Seizure  disorder ([place]) 01/29/2009    Past Medical History: Past Medical History:  Diagnosis Date  . Cerebral palsy ([place])   . Hypothyroidism   . Lennox-Gastaut syndrome ([place])   . Osteoporosis   . Pneumonia   . Profound mental retardation   . Reflux   . Rickets   . Seizures ([place])     Past Surgical History: Past Surgical History:  Procedure Laterality Date  . FOOT MASS EXCISION Right   . Gastrocutaneous fistual repair  2012  . PEG TUBE PLACEMENT      Social History: Social History   Tobacco Use  . Smoking status: Never Smoker  . Smokeless tobacco: Never Used  Substance Use Topics  . Alcohol use: No  . Drug use: No    Please also refer to relevant sections of EMR.  Family History: Family History  Problem Relation Age of Onset  . Hypertension Mother   . Heart disease Maternal Grandmother        Died at 80  . Stroke Maternal Grandfather        died at 44  . Hypertension Paternal Grandmother   . Diabetes Unknown        MGGM  . Heart disease Unknown        MGGF  . Kidney disease Unknown        Maternal Great Aunt   . Cancer Maternal Aunt        lung  . Colon cancer Neg Hx     Allergies and Medications: Allergies  Allergen Reactions  . Carbamazepine Rash   No current facility-administered medications on file prior to encounter.    Current Outpatient Medications on File Prior to Encounter  Medication Sig Dispense Refill  . acetaminophen (TYLENOL) 160 MG/5ML suspension Take 320 mg by mouth every 6 (six) hours as needed for mild pain.     Marland Kitchen acetaminophen (TYLENOL) 650 MG suppository Place 650 mg rectally every 8 (eight) hours as needed for mild pain.    Marland Kitchen amoxicillin-clavulanate (AUGMENTIN) 875-125 MG tablet Take 1 tablet by mouth every 12 (twelve) hours. 14 tablet 0  . BANZEL 40 MG/ML SUSP Take 69m in the morning and 534mat night 460 mL 5  . Calcium Carbonate-Vitamin D (CALCARB 600/D) 600-400 MG-UNIT tablet Take 1 tablet by mouth daily. Only gives once a week.     . clotrimazole (LOTRIMIN) 1 % cream APPLY EXTERNALLY TO THE AFFECTED AREA TWICE DAILY (Patient taking differently: APPLY EXTERNALLY TO THE AFFECTED AREA TWICE DAILY PRN) 60 g 0  . DEPAKOTE SPRINKLES 125 MG capsule TAKE 8 CAPSULES BY MOUTH TWICE DAILY 496 capsule 5  . DIASTAT ACUDIAL 20 MG GEL DIAL TO 15 MG, GIVE RECTALLY FOR SEIZURES LASTING 2 MINUTES OR LONGER 2 Package 5  . feeding supplement, ENSURE ENLIVE, (ENSURE ENLIVE) LIQD Take 237 mLs by mouth 3 (three) times daily between meals. 237 mL 12  . fluticasone (FLONASE) 50 MCG/ACT nasal spray SHAKE LIQUID AND USE 2 SPRAYS IN EACH NOSTRIL DAILY 16 g 5  . furosemide (LASIX) 20 MG tablet TAKE 1 TABLET BY MOUTH DAILY AS NEEDED FOR FLUID RETENTION 90 tablet 0  . guaiFENesin (ROBITUSSIN) 100 MG/5ML liquid Take 200 mg by mouth as needed for cough or congestion.    . hydrocortisone cream 0.5 % Apply 1 application topically 2 (two) times daily.  60 g 0  . ibuprofen (ADVIL,MOTRIN) 200 MG tablet Take 400 mg by mouth as needed for moderate pain.    Marland Kitchen KEPPRA 100 MG/ML solution Take 4m by mouth every morning and 121mby mouth at night 816 mL 5  . lactulose (CHRONULAC) 10 GM/15ML solution TAKE 30 ML BY MOUTH TWICE DAILY 1892 mL 0  . levOCARNitine (CARNITOR) 1 GM/10ML solution TAKE 5 MLS BY MOUTH THREE TIMES DAILY 510 mL 5  . liver oil-zinc oxide (DESITIN) 40 % ointment Apply 1 application topically daily.    . miconazole (MONISTAT 1 COMBINATION PACK) kit Place 1 each vaginally at bedtime.    . Marland Kitchenystatin (NYSTATIN) powder APPLY TO AFFECTED DIAPER AREAS WITH EVERY DIAPER CHANGE UNTIL RASH CLEARS 60 g 0  . ondansetron (ZOFRAN) 4 MG/5ML solution TAKE 5 MLS FOR PAIN EVERY 8 HOURS AS NEEDED FOR NAUSEA OR VOMITING 50 mL 11  . pantoprazole sodium (PROTONIX) 40 mg/20 mL PACK PLACE 10 MLS INTO THE FEEDING TUBE DAILY (Patient taking differently: Take 40 mg by mouth daily. ) 30 each 0  . TOPAMAX SPRINKLE 25 MG capsule TAKE 6 CAPSULES BY MOUTH IN THE MORNING AND 6  CAPSULES BY MOUTH IN THE EVENING WITH FOOD 408 capsule 5    Objective: BP 118/72 (BP Location: Right Arm)   Pulse (!) 128   Temp (!) 101 F (38.3 C) (Oral)   Resp (!) 25   SpO2 95%  Exam: General: obese female lying in bed, NAD  Eyes: EOMI, PERRL  ENTM: uncooperative with oral exam, lips appear moist  Neck: Supple.  Cardiovascular: Tachycardic, regular rhythm.  Respiratory: Coarse breath sounds. Difficult to perform good respiratory exam due to body habitus and lack of patient cooperation. No respiratory distress noted.  Gastrointestinal: +BS, no distention, no TTP  MSK: Moves all extremities. 1+ LE edema present.  Derm: Warm and dry.  Neuro: tracks with eyes, follows some commands, no verbalizations  Psych: Calm affect.   Labs and Imaging: CBC BMET  Recent Labs  Lab 11/05/17 0830 11/09/17 0948  WBC 10.8*  --   HGB 13.5 12.6  HCT 44.3 37.0  PLT 182  --    Recent Labs  Lab 11/05/17 0947 11/09/17 0948  NA 144 143  K 4.3 3.8  CL 112* 110  CO2 24  --   BUN 13 10  CREATININE 0.87 0.80  GLUCOSE 90 88  CALCIUM 9.2  --      Dg Chest Port 1 View  Result Date: 11/09/2017 CLINICAL DATA:  Cough and fever EXAM: PORTABLE CHEST 1 VIEW COMPARISON:  11/05/2017 FINDINGS: Cardiac shadow is stable. The lungs are hypoinflated but clear. No bony abnormality is seen. IMPRESSION: Poor inspiratory effort without focal infiltrate. Electronically Signed   By: MaInez Catalina.D.   On: 11/09/2017 08:54   Dg Chest Portable 1 View  Result Date: 11/05/2017 CLINICAL DATA:  Seizure. EXAM: PORTABLE CHEST 1 VIEW COMPARISON:  06/29/2017 FINDINGS: The cardiac silhouette, mediastinal and hilar contours are stable. Increased retrocardiac density on the left suspicious for infiltrate. No pleural effusion. The bony thorax is intact. IMPRESSION: Suspect left lower lobe infiltrate. Electronically Signed   By: P.Marijo Sanes.D.   On: 11/05/2017 09:38    WaNicolette BangDO 11/09/2017, 10:03  AM PGY-3, [facility] pager: [phone] pages welcome

## 2017-11-09 NOTE — ED Notes (Signed)
Family at bedside, pt eating lunch tray

## 2017-11-09 NOTE — ED Triage Notes (Signed)
Pt to ER from home via [facility] for unresponsiveness and hypoxia - patient has cerebral palsy, is cared for at home by mother. Was seen and discharged yesterday with diagnosis of pneumonia - d/c with antibiotics. Pt mother activated EMS due to fever and unresponsive this morning. On EMS arrival, was placed on NRB and required oxygen once more alert, sats in high 70's on RA.

## 2017-11-10 DIAGNOSIS — G809 Cerebral palsy, unspecified: Secondary | ICD-10-CM

## 2017-11-10 DIAGNOSIS — J189 Pneumonia, unspecified organism: Secondary | ICD-10-CM | POA: Diagnosis not present

## 2017-11-10 LAB — BASIC METABOLIC PANEL
ANION GAP: 8 (ref 5–15)
BUN: 13 mg/dL (ref 6–20)
CALCIUM: 8.5 mg/dL — AB (ref 8.9–10.3)
CO2: 22 mmol/L (ref 22–32)
Chloride: 113 mmol/L — ABNORMAL HIGH (ref 101–111)
Creatinine, Ser: 0.89 mg/dL (ref 0.44–1.00)
GFR calc Af Amer: >60 mL/min (ref >60)
GLUCOSE: 72 mg/dL (ref 65–99)
Potassium: 5.5 mmol/L — ABNORMAL HIGH (ref 3.5–5.1)
SODIUM: 143 mmol/L (ref 135–145)

## 2017-11-10 LAB — CBC
HEMATOCRIT: 36.3 % (ref 36.0–46.0)
Hemoglobin: 11 g/dL — ABNORMAL LOW (ref 12.0–15.0)
MCH: 30.7 pg (ref 26.0–34.0)
MCHC: 30.3 g/dL (ref 30.0–36.0)
MCV: 101.4 fL — AB (ref 78.0–100.0)
Platelets: 180 10*3/uL (ref 150–400)
RBC: 3.58 MIL/uL — ABNORMAL LOW (ref 3.87–5.11)
RDW: 16.9 % — AB (ref 11.5–15.5)
WBC: 13 10*3/uL — ABNORMAL HIGH (ref 4.0–10.5)

## 2017-11-10 LAB — HIV ANTIBODY (ROUTINE TESTING W REFLEX): HIV SCREEN 4TH GENERATION: NONREACTIVE

## 2017-11-10 MED ORDER — LORAZEPAM 2 MG/ML IJ SOLN: 1.0000 mg | INTRAMUSCULAR | Status: DC | PRN

## 2017-11-10 MED ORDER — DIVALPROEX SODIUM 125 MG PO CSDR
1000.0000 mg | DELAYED_RELEASE_CAPSULE | Freq: Two times a day (BID) | ORAL | Status: DC
  Administered 2017-11-10 – 2017-11-12 (×6): 1000 mg via ORAL
  Filled 2017-11-10 (×7): qty 8

## 2017-11-10 MED ORDER — FLUCONAZOLE 40 MG/ML PO SUSR
150.0000 mg | Freq: Once | ORAL | Status: AC
  Administered 2017-11-10: 152 mg
  Filled 2017-11-10: qty 3.8

## 2017-11-10 NOTE — Plan of Care (Signed)
  Problem: Safety: Goal: Ability to remain free from injury will improve Outcome: Progressing   Problem: Clinical Measurements: Goal: Will remain free from infection Note:  Vitals stable Patient doesn't keep oxygen tubing in nose or pulse oximeter on continuously

## 2017-11-10 NOTE — Progress Notes (Signed)
Lab stated cbc drawn was clotted  Reordered cbc to be drawn

## 2017-11-10 NOTE — Progress Notes (Addendum)
Family Medicine Teaching Service Daily Progress Note Intern Pager: [phone]  Patient name: Michele [name] Medical record number: [id number] Date of birth: [birth year] Age: 32 y.o. Gender: female  Primary Care Provider: Howard Pouch, MD Consultants: None Code Status: FULL   Pt Overview and Major Events to Date:  Admit 5/24 CTX (5/24-  ) Azithro (5/24-  )  Assessment and Plan: Michele [name] is a 32 y.o. female presenting by EMS with cough and decreased responsiveness, recently diagnosed with CAP. PMH is significant for infantile CP, intellectual disability, seizure disorder, GERD.    CAP:  Has remained afebrile overnight with mild white count to 13.  Appears comfortable, satting 93% on room air. Treating with CTX and Azithromycin for CAP.  CXR obtained and was poor quality due to body habitus and poor inspiratory effort.  Mother at bedside and feels she is making slow improvement but is at her baseline.  -supplemental O2 prn, wean as tolerated  -continue CTX and Azithromycin for CAP (5/24- ); transition to PO abx when able  -Strep pneumo/legionella pending -blood cx pending  -aspiration precautions   Infantile cerebral palsy/intellectual disability:completely dependent upon mother for ADLs. Lower extremity edema, non-pitting. Takes Lasix 20 mg daily prn.  - OOB with assistance only -continue home lasix 20 mg daily prn     Seizure disorder: takes Depakote  BID. Keppra  BID. Topamax  BID, Carnitor 0.34mh TID, Banzel susp  BID. Diastat prn rectal.  - continue home regimen - will add Ativan 1 mg prn in case of seizure   GERD: on Protonix 40 mg per tube daily  - Continue Protonix    FEN/GI: Heart Healthy Diet  Prophylaxis: Lovenox    Disposition: pending clinical improvement   Subjective:  Mother at bedside.  States pt did well overnight.  No fevers or shortness of breath.  Has a mild cough but otherwise no complaints.   Objective: Temp:  [97.4 F (36.3  C)-99 F (37.2 C)] 98.6 F (37 C) (05/25 0800) Pulse Rate:  [84-107] 84 (05/25 0800) Resp:  [16-21] 16 (05/25 0800) BP: (95-124)/(66-91) 97/68 (05/25 0800) SpO2:  [90 %-95 %] 92 % (05/25 0800)   Physical Exam: General: obese 32 yo female lying in bed, NAD  Eyes: EOMI, PERRL  ENTM: MMM  Neck: Supple  Cardiovascular: RRR no MRG   Respiratory: Coarse breath sounds. No respiratory distress noted  Gastrointestinal: no distention, no TTP, +bs  MSK: Moves all extremities. 1+ LE edema present.  Derm: Warm and dry, no rash  Neuro: tracks with eyes, follows some commands at baseline, no verbalizations  Psych: Calm affect.   Laboratory: Recent Labs  Lab 11/05/17 0830 11/09/17 0930 11/09/17 0948 11/10/17 0728  WBC 10.8* 11.9*  --  13.0*  HGB 13.5 11.6* 12.6 11.0*  HCT 44.3 39.4 37.0 36.3  PLT 182 199  --  180   Recent Labs  Lab 11/05/17 0947 11/09/17 0948 11/10/17 0310  NA 144 143 143  K 4.3 3.8 5.5*  CL 112* 110 113*  CO2 24  --  22  BUN CREATININE 0.87 0.80 0.89  CALCIUM 9.2  --  8.5*  PROT 7.3  --   --   BILITOT 0.2*  --   --   ALKPHOS 55  --   --   ALT 19  --   --   AST 29  --   --   GLUCOSE 90 88 72   Imaging/Diagnostic Tests: No results found.  Freddrick March, MD PGY-2, [facility]

## 2017-11-11 ENCOUNTER — Inpatient Hospital Stay (HOSPITAL_COMMUNITY): Payer: Medicare Other

## 2017-11-11 DIAGNOSIS — J189 Pneumonia, unspecified organism: Secondary | ICD-10-CM | POA: Diagnosis not present

## 2017-11-11 DIAGNOSIS — M810 Age-related osteoporosis without current pathological fracture: Secondary | ICD-10-CM | POA: Diagnosis present

## 2017-11-11 DIAGNOSIS — E039 Hypothyroidism, unspecified: Secondary | ICD-10-CM | POA: Diagnosis present

## 2017-11-11 DIAGNOSIS — G808 Other cerebral palsy: Secondary | ICD-10-CM | POA: Diagnosis present

## 2017-11-11 DIAGNOSIS — G40812 Lennox-Gastaut syndrome, not intractable, without status epilepticus: Secondary | ICD-10-CM | POA: Diagnosis present

## 2017-11-11 DIAGNOSIS — F73 Profound intellectual disabilities: Secondary | ICD-10-CM | POA: Diagnosis present

## 2017-11-11 DIAGNOSIS — Z6841 Body Mass Index (BMI) 40.0 and over, adult: Secondary | ICD-10-CM | POA: Diagnosis not present

## 2017-11-11 DIAGNOSIS — Z79899 Other long term (current) drug therapy: Secondary | ICD-10-CM | POA: Diagnosis not present

## 2017-11-11 DIAGNOSIS — K219 Gastro-esophageal reflux disease without esophagitis: Secondary | ICD-10-CM | POA: Diagnosis present

## 2017-11-11 DIAGNOSIS — B379 Candidiasis, unspecified: Secondary | ICD-10-CM | POA: Diagnosis present

## 2017-11-11 DIAGNOSIS — G809 Cerebral palsy, unspecified: Secondary | ICD-10-CM | POA: Diagnosis not present

## 2017-11-11 LAB — BASIC METABOLIC PANEL
Anion gap: 9 (ref 5–15)
BUN: 16 mg/dL (ref 6–20)
CALCIUM: 8.9 mg/dL (ref 8.9–10.3)
CO2: 24 mmol/L (ref 22–32)
Chloride: 112 mmol/L — ABNORMAL HIGH (ref 101–111)
Creatinine, Ser: 0.9 mg/dL (ref 0.44–1.00)
GFR calc Af Amer: >60 mL/min (ref >60)
GLUCOSE: 89 mg/dL (ref 65–99)
Potassium: 3.9 mmol/L (ref 3.5–5.1)
Sodium: 145 mmol/L (ref 135–145)

## 2017-11-11 LAB — CBC
HCT: 36.9 % (ref 36.0–46.0)
Hemoglobin: 10.8 g/dL — ABNORMAL LOW (ref 12.0–15.0)
MCH: 30.1 pg (ref 26.0–34.0)
MCHC: 29.3 g/dL — AB (ref 30.0–36.0)
MCV: 102.8 fL — AB (ref 78.0–100.0)
Platelets: 212 10*3/uL (ref 150–400)
RBC: 3.59 MIL/uL — ABNORMAL LOW (ref 3.87–5.11)
RDW: 17.1 % — AB (ref 11.5–15.5)
WBC: 13.6 10*3/uL — ABNORMAL HIGH (ref 4.0–10.5)

## 2017-11-11 LAB — C DIFFICILE QUICK SCREEN W PCR REFLEX
C Diff antigen: NEGATIVE
C Diff interpretation: NOT DETECTED
C Diff toxin: NEGATIVE

## 2017-11-11 MED ORDER — ZINC OXIDE 40 % EX OINT
TOPICAL_OINTMENT | Freq: Three times a day (TID) | CUTANEOUS | Status: DC
  Administered 2017-11-11 – 2017-11-12 (×3): via TOPICAL
  Filled 2017-11-11: qty 57

## 2017-11-11 MED ORDER — LEVOFLOXACIN IN D5W 750 MG/150ML IV SOLN
750.0000 mg | INTRAVENOUS | Status: DC
  Administered 2017-11-11: 750 mg via INTRAVENOUS
  Filled 2017-11-11 (×2): qty 150

## 2017-11-11 NOTE — Plan of Care (Signed)
  Problem: Education: Goal: Knowledge of General Education information will improve Outcome: Progressing  Patient mother wanted to hold lactulose due to several stool during day shift.  Patient remains calm overnight no distress noted.

## 2017-11-11 NOTE — Progress Notes (Signed)
Family Medicine Teaching Service Daily Progress Note Intern Pager: [phone]  Patient name: Michele [name] Medical record number: [id number] Date of birth: Oct 21, 1985 Age: 32 y.o. Gender: female  Primary Care Provider: Howard Pouch, MD Consultants: None Code Status: FULL   Pt Overview and Major Events to Date:  Admit 5/24 CTX (5/24- 5/26) Azithro (5/24-5/26) IV Levaquin (5/26-  )  Assessment and Plan: Michele [name] is a 32 y.o. female presenting by EMS with cough and decreased responsiveness, recently diagnosed with CAP. PMH is significant for infantile CP, intellectual disability, seizure disorder, GERD.    CAP:  Has remained afebrile overnight however with increasing white count to 13.6 from 10.8 over several days.  Appears comfortable, satting 93% on RA.  CXR obtained and was poor quality due to body habitus and poor inspiratory effort.  Will obtain repeat CXR this morning.  -increase abx to IV levaquin and reassess for improvement in white count; will discuss with pharmacy for transitioning to antibiotic which may be crushed with her applesauce/puddings as she no longer has tube and she is unable to take tabs -supplemental O2 prn  -Strep pneumo/legionella pending -blood cx NGx1D -aspiration precautions  -vitals per unit routine   Yeast infection.  Slightly worse yesterday with nystatin.  Diflucan x1 given in ED on admission and repeated per tube 5/25. -continue to monitor   Infantile cerebral palsy/intellectual disability:completely dependent upon mother for ADLs. Lower extremity edema, non-pitting. Takes Lasix 20 mg daily prn.  - OOB with assistance only -continue home lasix 20 mg daily prn     Seizure disorder: takes Depakote 1000 mg BID. Keppra 1200 mg BID. Topamax 150 mg BID, Carnitor 0.5 mh TID, Banzel susp 5 mg BID. Diastat prn rectal.  - continue home regimen - will add Ativan 1 mg prn in case of seizure   GERD: on Protonix 40 mg - Continue Protonix   FEN/GI:  Heart Healthy Diet  Prophylaxis: Lovenox    Disposition: pending clinical improvement   Subjective:  Mother at bedside and all concerns addressed.  She has had a cough.  No fevers or chills overnight.  No shortness of breath reported.   Objective: Temp:  [98 F (36.7 C)-99.7 F (37.6 C)] 98 F (36.7 C) (05/26 0419) Pulse Rate:  [84-103] 90 (05/26 0419) Resp:  [18-22] 18 (05/26 0419) BP: (91-122)/(44-66) 91/44 (05/26 0419) SpO2:  [87 %-99 %] 87 % (05/26 0419)   Physical Exam: General: obese 32 yo female lying in bed, NAD, alert  Eyes: EOMI, PERRL  ENTM: MMM, o/p clear  Neck: Supple Cardiovascular: RRR no MRG, 2+ pedal pulses  Respiratory: Coarse breath sounds, normal effort on RA  Gastrointestinal: no distention, no TTP, +bs  MSK: Moves all extremities. Trace LE edema present.  Derm: Warm and dry, no rash  Neuro: tracks with eyes, follows some commands at baseline, no verbalizations  Psych: Calm affect.   Laboratory: Recent Labs  Lab 11/09/17 0930 11/09/17 0948 11/10/17 0728 11/11/17 0318  WBC 11.9*  --  13.0* 13.6*  HGB 11.6* 12.6 11.0* 10.8*  HCT 39.4 37.0 36.3 36.9  PLT 199  --  180 212   Recent Labs  Lab 11/05/17 0947 11/09/17 0948 11/10/17 0310 11/11/17 0318  NA 144 143 143 145  K 4.3 3.8 5.5* 3.9  CL 112* 110 113* 112*  CO2 24  --  22 24  BUN CREATININE 0.87 0.80 0.89 0.90  CALCIUM 9.2  --  8.5* 8.9  PROT  7.3  --   --   --   BILITOT 0.2*  --   --   --   ALKPHOS 55  --   --   --   ALT 19  --   --   --   AST 29  --   --   --   GLUCOSE 90 88 72 89   Imaging/Diagnostic Tests: No results found.   Michele March, MD PGY-2, [facility]

## 2017-11-12 ENCOUNTER — Encounter (HOSPITAL_COMMUNITY): Payer: Self-pay | Admitting: General Practice

## 2017-11-12 ENCOUNTER — Other Ambulatory Visit: Payer: Self-pay

## 2017-11-12 LAB — CBC
HCT: 37.1 % (ref 36.0–46.0)
Hemoglobin: 11 g/dL — ABNORMAL LOW (ref 12.0–15.0)
MCH: 30.6 pg (ref 26.0–34.0)
MCHC: 29.6 g/dL — ABNORMAL LOW (ref 30.0–36.0)
MCV: 103.1 fL — AB (ref 78.0–100.0)
PLATELETS: 234 10*3/uL (ref 150–400)
RBC: 3.6 MIL/uL — ABNORMAL LOW (ref 3.87–5.11)
RDW: 17.2 % — AB (ref 11.5–15.5)
WBC: 11.8 10*3/uL — AB (ref 4.0–10.5)

## 2017-11-12 LAB — BASIC METABOLIC PANEL
Anion gap: 8 (ref 5–15)
BUN: 15 mg/dL (ref 6–20)
CALCIUM: 9 mg/dL (ref 8.9–10.3)
CO2: 25 mmol/L (ref 22–32)
Chloride: 115 mmol/L — ABNORMAL HIGH (ref 101–111)
Creatinine, Ser: 0.76 mg/dL (ref 0.44–1.00)
GFR calc Af Amer: >60 mL/min (ref >60)
Glucose, Bld: 83 mg/dL (ref 65–99)
Potassium: 3.5 mmol/L (ref 3.5–5.1)
SODIUM: 148 mmol/L — AB (ref 135–145)

## 2017-11-12 MED ORDER — LEVOFLOXACIN 25 MG/ML PO SOLN
750.0000 mg | Freq: Every day | ORAL | Status: DC
  Administered 2017-11-12: 750 mg via ORAL
  Filled 2017-11-12: qty 30

## 2017-11-12 MED ORDER — CLOTRIMAZOLE 1 % VA CREA: 1.0000 | TOPICAL_CREAM | Freq: Every day | VAGINAL | 0 refills | Status: DC

## 2017-11-12 MED ORDER — LEVOFLOXACIN 25 MG/ML PO SOLN: 750.0000 mg | Freq: Every day | ORAL | 0 refills | Status: AC

## 2017-11-12 MED ORDER — LOPERAMIDE HCL 2 MG PO CAPS: 2.0000 mg | ORAL_CAPSULE | ORAL | 0 refills | Status: DC | PRN

## 2017-11-12 MED ORDER — LEVOFLOXACIN 25 MG/ML PO SOLN: 250.0000 mg | Freq: Three times a day (TID) | ORAL | 0 refills | Status: DC

## 2017-11-12 MED ORDER — LOPERAMIDE HCL 2 MG PO CAPS
2.0000 mg | ORAL_CAPSULE | ORAL | Status: DC | PRN
  Filled 2017-11-12: qty 1

## 2017-11-12 NOTE — Progress Notes (Signed)
Pt discharged to home. IV removed, pressure held on site, hemostasis achieved. AVS given to mother, discharge instructions discussed, verbalized understanding. Home meds retrieved from pharmacy, given back to mother. Patient will take wheelchair transport down to lobby. Pt in stable condition.

## 2017-11-12 NOTE — Progress Notes (Signed)
Family Medicine Teaching Service Daily Progress Note Intern Pager: [phone]  Patient name: Michele [name] Medical record number: [id number] Date of birth: [birth year] Age: 32 y.o. Gender: female  Primary Care Provider: Howard Pouch, MD Consultants: None Code Status: FULL   Pt Overview and Major Events to Date:  Admit 5/24 CTX (5/24- 5/26) Azithro (5/24-5/26) IV Levaquin (5/26-  )  po Levaquin (5/27- )   Assessment and Plan: Michele [name] is a 32 y.o. female presenting by EMS with cough and decreased responsiveness, recently diagnosed with CAP. PMH is significant for infantile CP, intellectual disability, seizure disorder, GERD.    CAP:  Treating for presumed CAP with IV levaquin.  Afebrile overnight and white count downtrending 13.6>11.8.  Appears alert and comfortable,  satting 94% on RA.  Repeat CXR obtained which revealed no focal consolidation, mild hazy RLL airspace disease which may reflect atelectasis vs pna.  BCx NGx2D.  -transition to po levaquin today - will opt for liquid form as she is unable to swallow tabs - Levaquin 250 mg TID   -Strep pneumo/legionella pending -aspiration precautions  -vitals per unit routine   Diarrhea  Mother reports frequent episodes of diarrhea, not overly foul smelling.  C diff negative.  Mother also reports irritation of her bottom likely 2/2 diarrhea.   -continue topical desitin ointment  -add imodium prn   Yeast infection.   Diflucan x1 given in ED on admission and repeated per tube 5/25. -continue nystatin topical powder -continue clotrimazole vaginal cream   Infantile cerebral palsy/intellectual disability:completely dependent upon mother for ADLs. Lower extremity edema, non-pitting. Takes Lasix 20 mg daily prn.  - OOB with assistance only -continue home lasix 20 mg daily prn     Seizure disorder: takes Depakote 1000 mg BID. Keppra 1200 mg BID. Topamax 150 mg BID, Carnitor 0.5 mh TID, Banzel susp 5 mg BID. Diastat prn rectal.  -  continue home regimen - will add Ativan 1 mg prn in case of seizure   GERD: on Protonix 40 mg - Continue Protonix   FEN/GI: Heart Healthy Diet  Prophylaxis: Lovenox    Disposition: Plan for discharge home this afternoon after first dose of po antibiotics   Subjective:  No fevers or chills overnight.  No SOB. Mother at bedside, expresses wanting to go home today on oral antibiotics.    Objective: Temp:  [97.5 F (36.4 C)-98.9 F (37.2 C)] 97.5 F (36.4 C) (05/27 0516) Pulse Rate:  [91-96] 92 (05/27 0516) Resp:  [18-20] 18 (05/27 0516) BP: (99-111)/(47-74) 101/47 (05/27 0516) SpO2:  [89 %-94 %] 94 % (05/27 0522)   Physical Exam: General: obese 32 yo female lying in bed, alert and interactive, NAD  ENTM: MMM, o/p clear  Neck: Supple  Cardiovascular: RRR no MRG  Respiratory: normal effort on RA  Gastrointestinal: no distention, no TTP, +bs  MSK: Moves all extremities Derm: Warm and dry, no rash  Neuro: alert, interacts with provider, no verbalizations  Psych: Calm affect.   Laboratory: Recent Labs  Lab 11/10/17 0728 11/11/17 0318 11/12/17 0207  WBC 13.0* 13.6* 11.8*  HGB 11.0* 10.8* 11.0*  HCT 36.3 36.9 37.1  PLT 180 212 234   Recent Labs  Lab 11/10/17 0310 11/11/17 0318 11/12/17 0207  NA 143 145 148*  K 5.5* 3.9 3.5  CL 113* 112* 115*  CO2 BUN CREATININE 0.89 0.90 0.76  CALCIUM 8.5* 8.9 9.0  GLUCOSE 72 89 83   Imaging/Diagnostic Tests: Dg  Chest 2 View  Result Date: 11/11/2017 CLINICAL DATA:  Community-acquired pneumonia EXAM: CHEST - 2 VIEW COMPARISON:  11/09/2017 FINDINGS: Low lung volumes. There is bilateral interstitial prominence likely related to low lung volumes. There is mild hazy right lower lobe airspace disease. There is no focal consolidation. There is no pleural effusion or pneumothorax. The heart and mediastinal contours are unremarkable. The osseous structures are unremarkable. IMPRESSION: 1. No focal consolidation.  Mild hazy right lower lobe airspace disease which may reflect atelectasis versus pneumonia. Electronically Signed   By: Elige Ko   On: 11/11/2017 17:00   Freddrick March, MD PGY-2, [facility]

## 2017-11-12 NOTE — Discharge Summary (Signed)
[facility] Discharge Summary  Patient name: Michele [name] Medical record number: [id number] Date of birth: [birth year] Age: 32 y.o. Gender: female Date of Admission: 11/09/2017  Date of Discharge: 11/12/2017 Admitting Physician: Kinnie Feil, MD  Primary Care Provider: Everrett Coombe, MD Consultants: None  Indication for Hospitalization: CAP  Discharge Diagnoses/Problem List:  CAP Diarrhea Yeast infection Cerebral palsy with intellectual disability Seizure disorder GERD  Disposition: home  Discharge Condition: stable, improved  Discharge Exam:  General:obese 32 yo female lying in bed, alert and interactive, NAD  ENTM:MMM, o/p clear  Neck:Supple  Cardiovascular:RRR no MRG  Respiratory: normal effort on RA  Gastrointestinal:no distention, no TTP, +bs  NZU:DODQV all extremities Derm:Warm and dry, no rash  Neuro:alert, interacts with provider, no verbalizations Psych:Calm affect.    Brief Hospital Course:  Michele Brossard Fordis a 32 y.o.femalepresenting by EMSwithcough and decreased responsiveness, recently diagnosed with CAP. PMH is significant forinfantile CP, intellectual disability, seizure disorder, GERD.    Patient presented with progressive cough, shortness of breath and fever found to have immune acquired pneumonia based on chest x-ray.  She was seen at [facility] for similar presentation.  She was discharged on Augmentin during her initial visit but at times did not seem to improve.  Via EMS after decreased responsiveness per mother found to have hypoxia on room air.  Upon arrival, patient was tachycardic at 128 with fever of 101F and normotensive with respirations in the mid 20s satting 95% on 2 L [place].  CXR performed showing suspected left lower lobe infiltrate concerning for pneumonia.  Patient was started on ceftriaxone and azithromycin, however was transitioned to Levaquin as of 5/26 due to concern for inadequate response.   Patient weaned to room air without fever and able to tolerate oral solution Levaquin along with diet.  Patient was discharged with 3 additional days of Levaquin 750 mg daily.  Patient was stable and scheduled for follow-up with PCP.  Issues for Follow Up:  1. Make sure patient has Levaquin oral solution at follow-up.  Continue treatment on 5/28-5/30 to complete 5-day course of antibiotics. 2. Patient has chronic diarrhea and lactulose was held.  Patient was given Imodium.  Abdomen was soft and nondistended on discharge.  Reassess and decide whether lactulose should be restarted or if Imodium is appropriate.  Significant Procedures: None  Significant Labs and Imaging:  Recent Labs  Lab 11/10/17 0728 11/11/17 0318 11/12/17 0207  WBC 13.0* 13.6* 11.8*  HGB 11.0* 10.8* 11.0*  HCT 36.3 36.9 37.1  PLT 180 212 234   Recent Labs  Lab 11/09/17 0948 11/10/17 0310 11/11/17 0318 11/12/17 0207  NA 143 143 145 148*  K 3.8 5.5* 3.9 3.5  CL 110 113* 112* 115*  CO2  --  _0 GLUCOSE 88 72 89 83  BUN _1 CREATININE 0.80 0.89 0.90 0.76  CALCIUM  --  8.5* 8.9 9.0    Results/Tests Pending at Time of Discharge: none  Discharge Medications:  Allergies as of 11/12/2017      Reactions   Carbamazepine Rash      Medication List    STOP taking these medications   amoxicillin-clavulanate 875-125 MG tablet Commonly known as:  AUGMENTIN     TAKE these medications   acetaminophen 160 MG/5ML suspension Commonly known as:  TYLENOL Take 320 mg by mouth every 6 (six) hours as needed for mild pain.   acetaminophen 650 MG suppository Commonly known as:  TYLENOL  Place 650 mg rectally every 8 (eight) hours as needed for mild pain.   BANZEL 40 MG/ML Susp Generic drug:  Rufinamide Take 25m in the morning and 531mat night   CALCARB 600/D 600-400 MG-UNIT tablet Generic drug:  Calcium Carbonate-Vitamin D Take 1 tablet by mouth daily. Only gives once a week.   clotrimazole 1 %  cream Commonly known as:  LOTRIMIN APPLY EXTERNALLY TO THE AFFECTED AREA TWICE DAILY What changed:  See the new instructions.   clotrimazole 1 % vaginal cream Commonly known as:  GYNE-LOTRIMIN Place 1 Applicatorful vaginally at bedtime.   DEPAKOTE SPRINKLES 125 MG capsule Generic drug:  divalproex TAKE 8 CAPSULES BY MOUTH TWICE DAILY   DIASTAT ACUDIAL 20 MG Gel Generic drug:  diazepam DIAL TO 15 MG, GIVE RECTALLY FOR SEIZURES LASTING 2 MINUTES OR LONGER   feeding supplement (ENSURE ENLIVE) Liqd Take 237 mLs by mouth 3 (three) times daily between meals.   fluticasone 50 MCG/ACT nasal spray Commonly known as:  FLONASE SHAKE LIQUID AND USE 2 SPRAYS IN EACH NOSTRIL DAILY   furosemide 20 MG tablet Commonly known as:  LASIX TAKE 1 TABLET BY MOUTH DAILY AS NEEDED FOR FLUID RETENTION   guaiFENesin 100 MG/5ML liquid Commonly known as:  ROBITUSSIN Take 200 mg by mouth as needed for cough or congestion.   hydrocortisone cream 0.5 % Apply 1 application topically 2 (two) times daily.   ibuprofen 200 MG tablet Commonly known as:  ADVIL,MOTRIN Take 400 mg by mouth as needed for moderate pain.   KEPPRA 100 MG/ML solution Generic drug:  levETIRAcetam Take 1255my mouth every morning and 40m43m mouth at night   lactulose 10 GM/15ML solution Commonly known as:  CHRONULAC TAKE 30 ML BY MOUTH TWICE DAILY   levOCARNitine 1 GM/10ML solution Commonly known as:  CARNITOR TAKE 5 MLS BY MOUTH THREE TIMES DAILY   levofloxacin 25 MG/ML solution Commonly known as:  LEVAQUIN Take 30 mLs (750 mg total) by mouth daily for 3 days. Start taking on:  11/13/2017   liver oil-zinc oxide 40 % ointment Commonly known as:  DESITIN Apply 1 application topically daily.   loperamide 2 MG capsule Commonly known as:  IMODIUM Take 1 capsule (2 mg total) by mouth as needed for diarrhea or loose stools.   miconazole kit Commonly known as:  MONISTAT 1 COMBINATION PACK Place 1 each vaginally at  bedtime.   nystatin powder Commonly known as:  nystatin APPLY TO AFFECTED DIAPER AREAS WITH EVERY DIAPER CHANGE UNTIL RASH CLEARS   ondansetron 4 MG/5ML solution Commonly known as:  ZOFRAN TAKE 5 MLS FOR PAIN EVERY 8 HOURS AS NEEDED FOR NAUSEA OR VOMITING   pantoprazole sodium 40 mg/20 mL Pack Commonly known as:  PROTONIX PLACE 10 MLS INTO THE FEEDING TUBE DAILY What changed:    how much to take  how to take this  when to take this  additional instructions   TOPAMAX SPRINKLE 25 MG capsule Generic drug:  topiramate TAKE 6 CAPSULES BY MOUTH IN THE MORNING AND 6 CAPSULES BY MOUTH IN THE EVENING WITH FOOD       Discharge Instructions: Please refer to Patient Instructions section of EMR for full details.  Patient was counseled important signs and symptoms that should prompt return to medical care, changes in medications, dietary instructions, activity restrictions, and follow up appointments.   Follow-Up Appointments: Follow-up Information    [facility] on 11/13/2017.   Why:  Go to appointment at  1:45 PM. Please arrive 15 minuntes early and bring your medications. Contact information: [address] [place], Luis Llorens Torres, DO 11/12/2017, 2:17 PM PGY-2, [place]

## 2017-11-12 NOTE — Discharge Instructions (Signed)
Continue the Levaquin daily for 3 more days.  Follow-up at the clinic as scheduled.        Community-Acquired Pneumonia, Adult Pneumonia is an infection of the lungs. One type of pneumonia can happen while a person is in a hospital. A different type can happen when a person is not in a hospital (community-acquired pneumonia). It is easy for this kind to spread from person to person. It can spread to you if you breathe near an infected person who coughs or sneezes. Some symptoms include:  A dry cough.  A wet (productive) cough.  Fever.  Sweating.  Chest pain.  Follow these instructions at home:  Take over-the-counter and prescription medicines only as told by your doctor. ? Only take cough medicine if you are losing sleep. ? If you were prescribed an antibiotic medicine, take it as told by your doctor. Do not stop taking the antibiotic even if you start to feel better.  Sleep with your head and neck raised (elevated). You can do this by putting a few pillows under your head, or you can sleep in a recliner.  Do not use tobacco products. These include cigarettes, chewing tobacco, and e-cigarettes. If you need help quitting, ask your doctor.  Drink enough water to keep your pee (urine) clear or pale yellow. A shot (vaccine) can help prevent pneumonia. Shots are often suggested for:  People older than 31 years of age.  People older than 31 years of age: ? Who are having cancer treatment. ? Who have long-term (chronic) lung disease. ? Who have problems with their body's defense system (immune system).  You may also prevent pneumonia if you take these actions:  Get the flu (influenza) shot every year.  Go to the dentist as often as told.  Wash your hands often. If soap and water are not available, use hand sanitizer.  Contact a doctor if:  You have a fever.  You lose sleep because your cough medicine does not help. Get help right away if:  You are short of breath and  it gets worse.  You have more chest pain.  Your sickness gets worse. This is very serious if: ? You are an older adult. ? Your body's defense system is weak.  You cough up blood. This information is not intended to replace advice given to you by your health care provider. Make sure you discuss any questions you have with your health care provider. Document Released: 11/22/2007 Document Revised: 11/11/2015 Document Reviewed: 09/30/2014 Elsevier Interactive Patient Education  [employer].

## 2017-11-13 ENCOUNTER — Other Ambulatory Visit: Payer: Self-pay

## 2017-11-13 ENCOUNTER — Ambulatory Visit (INDEPENDENT_AMBULATORY_CARE_PROVIDER_SITE_OTHER): Payer: Medicare Other | Admitting: Family Medicine

## 2017-11-13 VITALS — BP 100/60 | HR 98 | Temp 99.1°F

## 2017-11-13 DIAGNOSIS — J181 Lobar pneumonia, unspecified organism: Secondary | ICD-10-CM | POA: Diagnosis present

## 2017-11-13 DIAGNOSIS — J189 Pneumonia, unspecified organism: Secondary | ICD-10-CM

## 2017-11-13 NOTE — Assessment & Plan Note (Addendum)
Patient appears to be improving per mother and does not appear to be in any distress. Cont Levaquin for full course.  Original vital signs were concerning with a pulse of 112 and a pulse ox of 84% however on recheck her pulse came down to 98 and her pulse ox improved to 88% on room air. Given her improvement in vitals, overall non-distressed appearance, and improvement compared to discharge I will have her follow up in one week.  Strict return precautions discussed with mother. Red flags and reasons to seek immediate care also discussed.

## 2017-11-13 NOTE — Patient Instructions (Signed)
It was great to meet you today! Thank you for letting me participate in your care!  Today, we discussed your recent hospitalization due to community acquired pneumonia. You are being treated with antibiotics and it is important to continue taking them until the full course is finished.  I will have her return in one week due to her vital signs being somewhat abnormal. If she begins having difficulty breathing, not acting like herself, becomes unresponsive, has a fever, develops nausea, vomiting, and/or diarrhea please seek immediate medical care.  Be well, Jules Schick, DO PGY-1, [facility]

## 2017-11-13 NOTE — Progress Notes (Signed)
Subjective: Chief Complaint  Patient presents with  . Hospitalization Follow-up     HPI: Michele [name] is a 32 y.o. presenting to clinic today to discuss the following:  Hospital follow up for [facility] PNA Patient is non-verbal. Patient is accompanied by her mother. Per mom, she is doing better and is eating but not back to normal. She is not struggling to breath or appear short of breath. Her respirations are within normal range and she seems better since being discharged from the hospital. Mom has picked up the antibiotics and is administering them as prescribed. She is tolerating the antibiotics well and is having diarrhea but per mom she has diarrhea at times at baseline due to lactulose. Her diarrhea is not watery or foul-smelling.  Yeast Infection secondary to antibiotics Improving. Per mom cream appears to be helping. Patient is not scratching at the area and does not appear to be uncomfortable.  Denies fever, chills, SOB, abdominal pain, nausea, vomiting, or constipation. Endorses cough but improving, diarrhea but not changed from baseline.  Health Maintenance: None     ROS noted in HPI.   Past Medical, Surgical, Social, and Family History Reviewed & Updated per EMR.   Pertinent Historical Findings include:   Social History   Tobacco Use  Smoking Status Never Smoker  Smokeless Tobacco Never Used    Objective: BP 100/60   Pulse 98   Temp 99.1 F (37.3 C) (Axillary)   LMP 10/15/2017   SpO2 (!) 88%  Vitals and nursing notes reviewed  Physical Exam  Constitutional: She appears well-nourished.  Obese, wheel-chair bound  HENT:  Head: Normocephalic and atraumatic.  Eyes: Pupils are equal, round, and reactive to light. Conjunctivae are normal.  Neck: Normal range of motion.  Cardiovascular: Regular rhythm, normal heart sounds and intact distal pulses.  No murmur heard. Tachycardia  Pulmonary/Chest: Effort normal. No stridor. No respiratory distress. She  has no wheezes. She has no rales.  Transmitted upper airway sounds with mild rhonchi in right side  Abdominal: Soft. Bowel sounds are normal. She exhibits no distension. There is no tenderness.  Neurological: She is alert.  Skin: Skin is warm and dry. No rash noted.  Vitals reviewed.   Results for orders placed or performed during the hospital encounter of 11/09/17 (from the past 72 hour(s))  CBC     Status: Abnormal   Collection Time: 11/11/17  3:18 AM  Result Value Ref Range   WBC 13.6 (H) 4.0 - 10.5 K/uL   RBC 3.59 (L) 3.87 - 5.11 MIL/uL   Hemoglobin 10.8 (L) 12.0 - 15.0 g/dL   HCT 36.9 36.0 - 46.0 %   MCV 102.8 (H) 78.0 - 100.0 fL   MCH 30.1 26.0 - 34.0 pg   MCHC 29.3 (L) 30.0 - 36.0 g/dL   RDW 17.1 (H) 11.5 - 15.5 %   Platelets 212 150 - 400 K/uL    Comment: Performed at [facility], [place] [address], [place], [place] [address]  Basic metabolic panel     Status: Abnormal   Collection Time: 11/11/17  3:18 AM  Result Value Ref Range   Sodium 145 135 - 145 mmol/L   Potassium 3.9 3.5 - 5.1 mmol/L    Comment: DELTA CHECK NOTED   Chloride 112 (H) 101 - 111 mmol/L   CO2 24 22 - 32 mmol/L   Glucose, Bld 89 65 - 99 mg/dL   BUN 16 6 - 20 mg/dL   Creatinine, Ser  0.90 0.44 - 1.00 mg/dL   Calcium 8.9 8.9 - 10.3 mg/dL   GFR calc non Af Amer >60 >60 mL/min   GFR calc Af Amer >60 >60 mL/min    Comment: (NOTE) The eGFR has been calculated using the CKD EPI equation. This calculation has not been validated in all clinical situations. eGFR's persistently <60 mL/min signify possible Chronic Kidney Disease.    Anion gap 9 5 - 15    Comment: Performed at [facility] [address], [place], [place] [address]  C difficile quick scan w PCR reflex     Status: None   Collection Time: 11/11/17  5:35 PM  Result Value Ref Range   C Diff antigen NEGATIVE NEGATIVE   C Diff toxin NEGATIVE NEGATIVE   C Diff interpretation No C. difficile detected.     Comment: Performed at [facility], [place] [address] [place], [place] [address]  CBC     Status: Abnormal   Collection Time: 11/12/17  2:07 AM  Result Value Ref Range   WBC 11.8 (H) 4.0 - 10.5 K/uL    Comment: REPEATED TO VERIFY WHITE COUNT CONFIRMED ON SMEAR    RBC 3.60 (L) 3.87 - 5.11 MIL/uL   Hemoglobin 11.0 (L) 12.0 - 15.0 g/dL   HCT 37.1 36.0 - 46.0 %   MCV 103.1 (H) 78.0 - 100.0 fL   MCH 30.6 26.0 - 34.0 pg   MCHC 29.6 (L) 30.0 - 36.0 g/dL   RDW 17.2 (H) 11.5 - 15.5 %   Platelets 234 150 - 400 K/uL    Comment: Performed at [facility], [place] [address] [place], [place] [address]  Basic metabolic panel     Status: Abnormal   Collection Time: 11/12/17  2:07 AM  Result Value Ref Range   Sodium 148 (H) 135 - 145 mmol/L   Potassium 3.5 3.5 - 5.1 mmol/L   Chloride 115 (H) 101 - 111 mmol/L   CO2 25 22 - 32 mmol/L   Glucose, Bld 83 65 - 99 mg/dL   BUN 15 6 - 20 mg/dL   Creatinine, Ser 0.76 0.44 - 1.00 mg/dL   Calcium 9.0 8.9 - 10.3 mg/dL   GFR calc non Af Amer >60 >60 mL/min   GFR calc Af Amer >60 >60 mL/min    Comment: (NOTE) The eGFR has been calculated using the CKD EPI equation. This calculation has not been validated in all clinical situations. eGFR's persistently <60 mL/min signify possible Chronic Kidney Disease.    Anion gap 8 5 - 15    Comment: Performed at [facility] [address] [place], [place] [address]    Assessment/Plan:  CAP (community acquired pneumonia) Patient appears to be improving per mother and does not appear to be in any distress. Cont Levaquin for full course.  Original vital signs were concerning with a pulse of 112 and a pulse ox of 84% however on recheck her pulse came down to 98 and her pulse ox improved to 88% on room air. Given her improvement in vitals, overall non-distressed appearance, and improvement compared to discharge I will have her follow up in one week.  Strict return precautions discussed with mother. Red flags and reasons to seek  immediate care also discussed.   PATIENT EDUCATION PROVIDED: See AVS    Diagnosis and plan along with any newly prescribed medication(s) were discussed in detail with this patient today. The patient verbalized understanding and agreed with  the plan. Patient advised if symptoms worsen return to clinic or ER.   Health Maintainance:   No orders of the defined types were placed in this encounter.   No orders of the defined types were placed in this encounter.    Harolyn Rutherford, DO 11/13/2017, 1:43 PM PGY-1, [place]

## 2017-11-14 LAB — CULTURE, BLOOD (ROUTINE X 2)
Culture: NO GROWTH
Culture: NO GROWTH
SPECIAL REQUESTS: ADEQUATE

## 2017-11-19 ENCOUNTER — Ambulatory Visit: Payer: Medicare Other | Attending: Family Medicine | Admitting: Occupational Therapy

## 2017-11-19 DIAGNOSIS — I890 Lymphedema, not elsewhere classified: Secondary | ICD-10-CM | POA: Diagnosis not present

## 2017-11-19 NOTE — Therapy (Signed)
[place] MAIN [facility] SERVICES [address] [place], [place], [address] Phone: [phone]   Fax:  [phone]  Occupational Therapy Treatment  Patient Details  Name: Michele [name] MRN: [id number] Date of Birth: 1985-07-02 Referring Provider: Everrett Coombe, MD   Encounter Date: 11/19/2017  OT End of Session - 11/19/17 1434    Visit Number  21    Number of Visits  36    Date for OT Re-Evaluation  02/17/18    OT Start Time  0900    OT Stop Time  0950    OT Time Calculation (min)  50 min    Equipment Utilized During Treatment  Tyvek sock, friction gloves    Activity Tolerance  Patient tolerated treatment well;No increased pain;Other (comment) limited by impaired    mobility, ambulation , language limitations and significant intellectual delay    Behavior During Therapy  [facility] for tasks assessed/performed       Past Medical History:  Diagnosis Date  . Cerebral palsy ([place])   . Hypothyroidism   . Lennox-Gastaut syndrome ([place])   . Osteoporosis   . Pneumonia   . Profound mental retardation   . Reflux   . Rickets   . Seizures ([place])     Past Surgical History:  Procedure Laterality Date  . FOOT MASS EXCISION Right   . Gastrocutaneous fistual repair  2012  . PEG TUBE PLACEMENT    . PEG TUBE REMOVAL  2012 ?    There were no vitals filed for this visit.  Subjective Assessment - 11/19/17 1002    Subjective   Michele [name] returns to OT today for CDT visit # 21 to address BLE lymphedema. Pt is accompanied by her mother and primary CG today, who reports Michele [name] was admitted to the hospital on 5/24 and DC on 5/27 2/2 unresolved episode of CAP. Pt was last seen for lympjhedema care on 09/25/17. Mother reports they have been  diligent with daily compression garments and GOS devices, skin care and MLD. Mother has no new concerns today. "Aivah is doing good with her legs. She lost some weight in the hospital too."    Patient is accompained by:  Family member    Currently in Pain?  No/denies          LYMPHEDEMA/ONCOLOGY QUESTIONNAIRE - 11/19/17 1418      Left Lower Extremity Lymphedema   Other  LLE ankle-to popliteal (A-D) limb volume measures 3536.49 ml.    Other  LLE is increased in volume by  30.80% since last measured on 09/04/17.% since last measured on 08/15/2017.  LLE A-D volume is decreased by 7.2% overall since commencing OT for CDT on 2/11.              OT Treatments/Exercises (OP) - 11/19/17 0001      ADLs   ADL Education Given  Yes      Manual Therapy   Manual Therapy  Edema management    Manual therapy comments  completed anatomical measurements for custom compression garment replacements. Completed comparative BLE limb volumetric measureuents.    Edema Management  compression garment assessment             OT Education - 11/19/17 1007    Education provided  Yes    Education Details  Reviewed LE Management Phase CDT goals, self care regimes, and garment replacement schedule. Reviewed new garment measurements, progress towards goals, and POC    Person(s) Educated  Patient;Parent(s)    Methods  Explanation;Demonstration;Handout    Comprehension  Verbalized understanding;Returned demonstration          OT Long Term Goals - 11/19/17 1431      OT LONG TERM GOAL #1   Title  Pt to follow Lymphedema precautions and prevention strategies with maximum caregiver assistance to limit LE progression and further functional decline. (Primary caregiver modified independent w/ lymphedema precautions and prevention principals and strategies using printed reference to limit LE progression and infection risk.)    Baseline  Max A  08/22/2017: Parent/ Primary CG achieved.    Time  2    Period  Weeks    Status  Achieved      OT LONG TERM GOAL #2   Title  Lymphedema (LE) management/ self-care: Pt able to apply knee length, multi layered, gradient compression wraps, one leg at a time, with max caregiver assist x 1 using proper  techniques within 2 weeks to achieve optimal limb volume reduction in preparation for fitting compression garments/ devices.    Baseline  Dependent    08/22/2017: Parent/ Primary CG achieved. Independent    Time  2    Period  Weeks    Status  Achieved      OT LONG TERM GOAL #3   Title  Lymphedema (LE) management/ self-care:  Pt to achieve at least 10%  BLE limb volume reductions  during Intensive Phase CDT to limit LE progression, to reduce infection risk, and to improve functional mobility and transfers essential for optimal ADLs performance.    Baseline  Dependent  08/22/2017: achied for LLE w/ 12% thus far.    Time  12    Period  Weeks    Status  Partially Met      OT LONG TERM GOAL #4   Title  Lymphedema (LE) management/ self-care:  Pt to tolerate daily compression wraps, compression garments and/ or HOS devices in keeping w/ prescribed wear regimes within 1 week of issue date of each to progress and retain clinical and functional gains and to limit LE progression.    Baseline  Max A 08/22/2017: Partially met. Pt tolerated LLE compression wraps without difficulty. LLE custom day and night time compression garments are ordered. awaiting Medicaid preauthorization.    Time  12    Period  Weeks    Status  Achieved      OT LONG TERM GOAL #5   Title  Lymphedema (LE) management/ self-care:  During Management Phase CDT Pt to sustain current limb volumes within 3%, and all other clinical gains achieved during OT treatment iwith maximum caregiver assistance  (to control limb swelling and associated pain/ discomfort, to  limit LE progression, to decrease infection risk and to limit further functional decline.    Baseline  dependent  11/19/17: LLE limb volume increased 30.80% and RLE A-D volume increased 19.29% since last measured on 09/04/17.    Time  6    Period  Months    Status  Not Met            Plan - 11/19/17 1420    Clinical Impression Statement  Pt returns to OT for F/U on LE care. Pt  presents w/ custom daytime compression garments in place. Pt last seen on 09/04/17. when she transitioned from Intensive t Management Phase CDT.  Parent reports 100% compliance with all LE self-care home program components.  Pt was recently hospitalized for CAP  w/ altered consciousness.  Completed custom compression garment assessment. Garments show sign of normal  wear and tear. Considering how long it takes to obtain replacement garments from DME vendors for Medicaid recipients,, mother in agreement with initiating garment replacement now. Completed anatomical measurements for custom compression garment replacements. Completed comparative BLE limb volumetric measureuents. Despite noticable weight loss since last seen, LLE is dramatically increasedi n limb volume below the knee today by 30.80%. RLE is  also significantly increased by 19.29% below the knee. It's unclear why volumes are  increased  so significantly. We'll remeasure limb volumes next visit and Mother agrees with plan to use compression wraps a couple of times a week until nect OT visit in ~ 1 month.  Volumetric increase may  be partially due to     medications         used during recent hospitalization,  and / or may be related to systemic fluid retention. Garment replacements have been  ordered.   We'll fit replacement garments and repeat limb volumetrics next visit.    Occupational Profile and client history currently impacting functional performance  intractible seizures, CP, intelectual delay, quadriparesis, dependent on others for all besic and instrumental ADLs    Occupational performance deficits (Please refer to evaluation for details):  ADL's;IADL's;Work;Play;Leisure;Social Participation;Rest and Sleep;Education    Rehab Potential  Good    Current Impairments/barriers affecting progress:  impaired intellectual delay and quadraparesis    OT Frequency  2x / week    OT Treatment/Interventions  Self-care/ADL training;DME and/or AE  instruction;Manual Therapy;Patient/family education;Manual lymph drainage;Compression bandaging;Therapeutic activities;Other (comment) BLE skin care during MLD with low ph Eucerin lotion and Palma Christii, skin grade castor oil.    Clinical Decision Making  Multiple treatment options, significant modification of task necessary    Recommended Other Services  Fit with appropriate, knee length, BLE compression garments and or devices- For daytime consider custom, flat knit, open toe, Jobst Elvarex elastic classic stockings  And /Or adjustable, Velcro close, CircAid, short stretch leg and foot piece for ease of donning and doffing. Fit w/ HOS devices, ( consider Jobst, ccl 2 Relax) to limit fibrosis formation and improve lymphatic transport during HOS.       Patient will benefit from skilled therapeutic intervention in order to improve the following deficits and impairments:  Decreased knowledge of use of DME, Decreased skin integrity, Increased edema, Decreased knowledge of precautions, Decreased safety awareness, Difficulty walking, Obesity, Decreased balance  Visit Diagnosis: Lymphedema, not elsewhere classified - Plan: Ot plan of care cert/re-cert    Problem List Patient Active Problem List   Diagnosis Date Noted  . Community acquired pneumonia 11/11/2017  . CAP (community acquired pneumonia) 11/09/2017  . Moniliasis   . Lennox-Gastaut syndrome ([place]) 07/06/2017  . Acute decrease in level of consciousness 06/29/2017  . Lymphedema 01/22/2017  . HCAP (healthcare-associated pneumonia) 12/06/2016  . Arterial hypotension   . Sepsis ([place]) 11/04/2016  . Other constipation   . Bilateral lower extremity edema 04/21/2016  . Unsteadiness on feet   . Community acquired pneumonia of left lower lobe of lung ([place]) 04/05/2016  . Cough 04/03/2016  . Rhinitis, non-allergic 06/23/2015  . Special screening examination for human papillomavirus (HPV) 07/20/2014  . Amenorrhea 05/19/2014  . 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 Long-term use of high-risk medication 12/24/2012  . Morbid obesity ([place]) 12/24/2012  . Congenital quadriplegia ([place]) 12/24/2012  . URINARY INCONTINENCE 02/25/2010  . Full incontinence of feces 02/25/2010  . DIGESTIVE SYSTEM COMPLICATION NEC 34/74/2595  . Hypothyroidism 01/29/2009  . 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  Cerebral palsy ([place]) 01/29/2009  . GASTROESOPHAGEAL REFLUX DISEASE 01/29/2009  . Osteoporosis 01/29/2009  . Seizure disorder ([place]) 01/29/2009    Andrey Spearman, MS, OTR/L, [facility] 11/19/17 2:49 PM   [place] MAIN [facility] SERVICES [address] [place], [place], [address] Phone: [phone]   Fax:  [phone]  Name: WILEEN DUNCANSON MRN: [id number] Date of Birth: 02-16-1986

## 2017-11-21 ENCOUNTER — Ambulatory Visit (INDEPENDENT_AMBULATORY_CARE_PROVIDER_SITE_OTHER): Payer: Medicare Other | Admitting: Student

## 2017-11-21 ENCOUNTER — Encounter: Payer: Self-pay | Admitting: Student

## 2017-11-21 ENCOUNTER — Other Ambulatory Visit: Payer: Self-pay

## 2017-11-21 VITALS — BP 98/56 | HR 71 | Temp 98.0°F | Ht <= 58 in | Wt 208.8 lb

## 2017-11-21 DIAGNOSIS — J189 Pneumonia, unspecified organism: Secondary | ICD-10-CM

## 2017-11-21 DIAGNOSIS — J181 Lobar pneumonia, unspecified organism: Secondary | ICD-10-CM | POA: Diagnosis present

## 2017-11-21 NOTE — Patient Instructions (Signed)
It was great seeing you all today! I am glad troponins feeling well.  Oxygen saturation is 92% which is good.  His heart and lung exam is also normal. She can follow-up with us as needed.  If you have any questions or concerns before then, please call the clinic at [phone].  Please check-out at the front desk before leaving the clinic.   Take Care,   Dr. Alanda SlimGonfa

## 2017-11-21 NOTE — Progress Notes (Signed)
Subjective:    Michele [name] is a 32 y.o. old female here for follow-up on aspirational  Pneumonia.  She is here with her mother.  HPI Aspirational pneumonia: Patient was hospitalized 11/09/2017 to 11/12/2017 for aspiration pneumonia.  She was initially treated with ceftriaxone and azithromycin and transitioned to Levaquin.  She was discharged on Levaquin.  She completed 5 days course of antibiotic.  She presented to clinic 11/13/2017 for hospital follow-up.  At that visit she was slightly tachycardic to 112 and pulse ox of 84%.  However, tachycardia and hypoxemia improved to 98 and 88% on recheck.  So she was advised to follow-up in clinic today. Today, patient appears to be in a good mood.  Mother has no concerns except that her appetite is not back to normal.  She is still on Ensure and boost.  She is also multivitamin.  She denies any problem breathing.  She denies further hypoxemia.  Patient's mother likes to know when she can return to school.   PMH/Problem List: has Hypothyroidism; Profound intellectual disabilities; Cerebral palsy (HCC); GASTROESOPHAGEAL REFLUX DISEASE; Osteoporosis; Seizure disorder (HCC); URINARY INCONTINENCE; DIGESTIVE SYSTEM COMPLICATION NEC; Full incontinence of feces; 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); Long-term use of high-risk medication; Morbid obesity (HCC); Congenital quadriplegia (HCC); Amenorrhea; Special screening examination for human papillomavirus (HPV); Rhinitis, non-allergic; Unsteadiness on feet; Bilateral lower extremity edema; Other constipation; Sepsis (HCC); Arterial hypotension; Lymphedema; Acute decrease in level of consciousness; Lennox-Gastaut syndrome (HCC); CAP (community acquired pneumonia); and Moniliasis on their problem list.   has a past medical history of Cerebral palsy (HCC), Hypothyroidism, Lennox-Gastaut syndrome (HCC), Osteoporosis, Pneumonia, Profound mental retardation, Reflux, Rickets, and Seizures (HCC).  FH:    Family History  Problem Relation Age of Onset  . Hypertension Mother   . Heart disease Maternal Grandmother        Died at 5664  . Stroke Maternal Grandfather        died at 1779  . Hypertension Paternal Grandmother   . Diabetes Unknown        MGGM  . Heart disease Unknown        MGGF  . Kidney disease Unknown        Maternal Great Aunt   . Cancer Maternal Aunt        lung  . Colon cancer Neg Hx     SH Social History   Tobacco Use  . Smoking status: Never Smoker  . Smokeless tobacco: Never Used  Substance Use Topics  . Alcohol use: No  . Drug use: No    Review of Systems Review of systems negative except for pertinent positives and negatives in history of present illness above.     Objective:     Vitals:   11/21/17 0931  BP: (!) 98/56  Pulse: 71  Temp: 98 F (36.7 C)  TempSrc: Axillary  SpO2: 92%  Weight: 208 lb 12.8 oz (94.7 kg)  Height: 4\' 10"  (1.473 m)   Body mass index is 43.64 kg/m.  Physical Exam  GEN: appears well and happy. No apparent distress. CVS: RRR, nl s1 & s2, no murmurs, no edema RESP: no IWOB, poor air movement over lower lung fields partly due to poor effort, CTAB MSK: no focal tenderness or notable swelling NEURO: Patient with cerebral palsy, wheelchair-bound  PSYCH: happy    Assessment and Plan:  1. Community acquired pneumonia of right lower lobe of lung (HCC): resolved.  Patient's mother denies cardiopulmonary symptoms.  Lung exam within normal limits except  for poor air movement over lower lung fields partly due to poor effort.  Follow-up as needed. Gave her letter for return to school.  Return if symptoms worsen or fail to improve.  Almon Hercules, MD 11/21/17 Pager: [phone]

## 2017-11-25 ENCOUNTER — Other Ambulatory Visit: Payer: Self-pay | Admitting: Student in an Organized Health Care Education/Training Program

## 2017-11-25 DIAGNOSIS — K219 Gastro-esophageal reflux disease without esophagitis: Secondary | ICD-10-CM

## 2017-12-17 ENCOUNTER — Encounter (INDEPENDENT_AMBULATORY_CARE_PROVIDER_SITE_OTHER): Payer: Self-pay | Admitting: Family

## 2017-12-17 ENCOUNTER — Ambulatory Visit (INDEPENDENT_AMBULATORY_CARE_PROVIDER_SITE_OTHER): Payer: Medicare Other | Admitting: Family

## 2017-12-17 VITALS — BP 108/68 | HR 88 | Ht 59.0 in | Wt 214.0 lb

## 2017-12-17 DIAGNOSIS — G808 Other cerebral palsy: Secondary | ICD-10-CM

## 2017-12-17 DIAGNOSIS — G40319 Generalized idiopathic epilepsy and epileptic syndromes, intractable, without status epilepticus: Secondary | ICD-10-CM | POA: Diagnosis not present

## 2017-12-17 DIAGNOSIS — G40814 Lennox-Gastaut syndrome, intractable, without status epilepticus: Secondary | ICD-10-CM

## 2017-12-17 DIAGNOSIS — F73 Profound intellectual disabilities: Secondary | ICD-10-CM | POA: Diagnosis not present

## 2017-12-17 MED ORDER — LEVOCARNITINE 1 GM/10ML PO SOLN: ORAL | 5 refills | Status: DC

## 2017-12-17 MED ORDER — KEPPRA 100 MG/ML PO SOLN: ORAL | 5 refills | Status: DC

## 2017-12-17 MED ORDER — BANZEL 40 MG/ML PO SUSP: ORAL | 5 refills | Status: DC

## 2017-12-17 MED ORDER — DEPAKOTE SPRINKLES 125 MG PO CSDR: DELAYED_RELEASE_CAPSULE | ORAL | 5 refills | Status: DC

## 2017-12-17 MED ORDER — DIASTAT ACUDIAL 20 MG RE GEL: RECTAL | 5 refills | Status: DC

## 2017-12-17 MED ORDER — TOPAMAX SPRINKLE 25 MG PO CPSP: ORAL_CAPSULE | ORAL | 5 refills | Status: DC

## 2017-12-17 NOTE — Progress Notes (Signed)
Patient: Michele [name] MRN: [id number] Sex: female DOB: 06-14-1986  Provider: Elveria Risingina Goodpasture, NP [facility]  Note type: Routine return visit  History of Present Illness: Referral Source: Asher MuirSusan Overstreet, MD History from: mother, patient and [facility] chart Chief Complaint: Seizures  Michele PineRobin C [name] is a 32 y.o. 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 She was last seen July 04, 2017. Michele BallRobin had seizures as a child associated with high fever. She has intellectual delay and quadriparesis. She is completely dependent upon others for all activities of daily living. Michele BallRobin is taking and tolerating brand Depakote, Keppra, Topamax and Banzel for her seizure disorder. When she has seizures, Michele BallRobin usually turns blue and requires blow by oxygen to recover. Mom tells me that Michele BallRobin had recent seizures in the setting of fever and aspiration pneumonia last month. She has been doing well since recovering from this illness. Mom said that she had a brief episode of her arms raising and jerking yesterday with no change in awareness. It is not clear if this was a seizure.   Michele BallRobin attends a day program and enjoys the activities there. Mom says that the workers try to get Sh[place] up and to take steps each day and that she has been doing well with that. She has not had any falls and has been otherwise generally healthy since her last visit. Mom has no other health concerns for Michele BallRobin today other than previously mentioned.   Review of Systems: Please see the HPI for neurologic and other pertinent review of systems. Otherwise, all other systems were reviewed and were negative.    Past Medical History:  Diagnosis Date  . Cerebral palsy (HCC)   . Hypothyroidism   . Lennox-Gastaut syndrome (HCC)   . Osteoporosis   . Pneumonia   . Profound mental retardation   . Reflux   . Rickets   . Seizures (HCC)    Hospitalizations: Yes.  ,  Head Injury: No., Nervous System Infections: No., Immunizations up to date: Yes.   Past Medical History Comments: See HPI   Surgical History Past Surgical History:  Procedure Laterality Date  . FOOT MASS EXCISION Right   . Gastrocutaneous fistual repair  2012  . PEG TUBE PLACEMENT    . PEG TUBE REMOVAL  2012 ?    Family History family history includes Cancer in her maternal aunt; Diabetes in her unknown relative; Heart disease in her maternal grandmother and unknown relative; Hypertension in her mother and paternal grandmother; Kidney disease in her unknown relative; Stroke in her maternal grandfather. Family History is otherwise negative for migraines, seizures, cognitive impairment, blindness, deafness, birth defects, chromosomal disorder, autism.  Social History Social History   Socioeconomic History  . Marital status: Single    Spouse name: Not on file  . Number of children: 0  . Years of education: Not on file  . Highest education level: Not on file  Occupational History  . Occupation: Disabled   Social Needs  . Financial resource strain: Not on file  . Food insecurity:    Worry: Not on file    Inability: Not on file  . Transportation needs:    Medical: Not on file    Non-medical: Not on file  Tobacco Use  . Smoking status: Never Smoker  . Smokeless tobacco: Never Used  Substance and Sexual Activity  . Alcohol use: No  . Drug use: No  . Sexual activity: Never  Lifestyle  . Physical activity:    Days per week: Not on file    Minutes per session: Not on file  . Stress: Not on file  Relationships  . Social connections:    Talks on phone: Not on file    Gets together: Not on file    Attends religious service: Not on file    Active member of club or organization: Not on file    Attends meetings of clubs or organizations: Not on file    Relationship status: Not on file  Other Topics Concern  . Not on file  Social History Narrative   0 caffeine drinks     Lives with mother who helps ADLs.   Graduated from [employer] in 2010. Now attends day program at [employer].    Enjoys playing in water, ripping paper, watching TV, music and going out.    Allergies Allergies  Allergen Reactions  . Carbamazepine Rash    Physical Exam BP 108/68   Pulse 88   Ht 4\' 11"  (1.499 m)   Wt 214 lb (97.1 kg)   BMI 43.22 kg/m  General: well developed, well nourished obese female, seated in wheelchair, in no evident distress; black hair, brown eyes, even handed Head: normocephalic and atraumatic. Oropharynx benign. No dysmorphic features. Neck: supple with no carotid bruits. Cardiovascular: regular rate and rhythm, no murmurs. Respiratory: Clear to auscultation bilaterally Abdomen: Bowel sounds present all four quadrants, abdomen soft, non-tender, non-distended. Musculoskeletal: Her limbs are difficult to examine due to excess adipose tissue Skin: no rashes or neurocutaneous lesions. She has no upper front teeth  Neurologic Exam Mental Status: Awake and fully alert. Has no language.  Smiles responsively. Claps her hands repeatedly. Grabs at the examiner. Fairly tolerant to invasions in to her space but grabs at examiner frequently. Cranial Nerves: Fundoscopic exam - red reflex present.  Unable to fully visualize fundus.  Pupils equal briskly reactive to light.  Turns to localize faces and objects in the periphery. Turns to localize sounds in the periphery. Facial movements are symmetric, has mild lower facial weakness with drooling.   Motor: Mild spastic quadriparesis with increased tone in her legs Sensory: Withdrawal x 4 Coordination: Unable to adequately assess due to patient's inability to participate in examination. No dysmetria when reaching for objects. Gait and Station: She needs 2 person assistance to stand. I did not get her out of her chair today. Reflexes: Difficult to adequately assess due to her lack of cooperation and excessive adipose tissue. Toes  neutral. No clonus  Impression 1. 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  Congenital quadriparesis 4.  Significant intellectual delay   Recommendations for plan of care The patient's previous [facility] records were reviewed. Adessa has neither had nor required imaging or lab studies since the last visit other than what was performed at a recent hospital visit for aspiration pneumonia. She is a 32 year old woman with history of Lennox-Gastaut encephalopathy, generalized convulsive epilepsy, congenital quadriparesis and significant intellectual delay. She is taking and tolerating Depakote, Keppra, Topamax, and Banzel for her seizure disorder. She had recent seizures associated with illness but has been otherwise seizure free since her last visit. Mahnoor will continue her medications without change for now and will return for follow up in 6 months or sooner if needed. Mom agreed with the plans made today.   The medication list was reviewed and reconciled.  No changes were made in the prescribed medications today.  A complete medication  list was provided to her mother.  Allergies as of 12/17/2017      Reactions   Carbamazepine Rash      Medication List        Accurate as of 12/17/17 11:59 PM. Always use your most recent med list.          acetaminophen 160 MG/5ML suspension Commonly known as:  TYLENOL Take 320 mg by mouth every 6 (six) hours as needed for mild pain.   acetaminophen 650 MG suppository Commonly known as:  TYLENOL Place 650 mg rectally every 8 (eight) hours as needed for mild pain.   BANZEL 40 MG/ML Susp Generic drug:  Rufinamide Take 5ml in the morning and 5ml at night   CALCARB 600/D 600-400 MG-UNIT tablet Generic drug:  Calcium Carbonate-Vitamin D Take 1 tablet by mouth daily. Only gives once a week.   clotrimazole 1 % vaginal cream Commonly known as:  GYNE-LOTRIMIN Place 1 Applicatorful vaginally at bedtime.   DEPAKOTE SPRINKLES 125 MG  capsule Generic drug:  divalproex TAKE 8 CAPSULES BY MOUTH TWICE DAILY   DIASTAT ACUDIAL 20 MG Gel Generic drug:  diazepam DIAL TO 15 MG, GIVE RECTALLY FOR SEIZURES LASTING 2 MINUTES OR LONGER   feeding supplement (ENSURE ENLIVE) Liqd Take 237 mLs by mouth 3 (three) times daily between meals.   fluticasone 50 MCG/ACT nasal spray Commonly known as:  FLONASE SHAKE LIQUID AND USE 2 SPRAYS IN EACH NOSTRIL DAILY   furosemide 20 MG tablet Commonly known as:  LASIX TAKE 1 TABLET BY MOUTH DAILY AS NEEDED FOR FLUID RETENTION   guaiFENesin 100 MG/5ML liquid Commonly known as:  ROBITUSSIN Take 200 mg by mouth as needed for cough or congestion.   hydrocortisone cream 0.5 % Apply 1 application topically 2 (two) times daily.   ibuprofen 200 MG tablet Commonly known as:  ADVIL,MOTRIN Take 400 mg by mouth as needed for moderate pain.   KEPPRA 100 MG/ML solution Generic drug:  levETIRAcetam Take 12ml by mouth every morning and 12ml by mouth at night   lactulose 10 GM/15ML solution Commonly known as:  CHRONULAC TAKE 30 ML BY MOUTH TWICE DAILY   levOCARNitine 1 GM/10ML solution Commonly known as:  CARNITOR TAKE 5 MLS BY MOUTH THREE TIMES DAILY   liver oil-zinc oxide 40 % ointment Commonly known as:  DESITIN Apply 1 application topically daily.   loperamide 2 MG capsule Commonly known as:  IMODIUM Take 1 capsule (2 mg total) by mouth as needed for diarrhea or loose stools.   nystatin powder Commonly known as:  nystatin APPLY TO AFFECTED DIAPER AREAS WITH EVERY DIAPER CHANGE UNTIL RASH CLEARS   ondansetron 4 MG/5ML solution Commonly known as:  ZOFRAN TAKE 5 MLS FOR PAIN EVERY 8 HOURS AS NEEDED FOR NAUSEA OR VOMITING   pantoprazole sodium 40 mg/20 mL Pack Commonly known as:  PROTONIX PLACE 10 MLS INTO THE FEEDING TUBE DAILY   PROTONIX 40 mg/20 mL Pack Generic drug:  pantoprazole sodium MIX AND GIVE 1 PACKET DAILY AS DIRECTED   TOPAMAX SPRINKLE 25 MG capsule Generic drug:   topiramate TAKE 6 CAPSULES BY MOUTH IN THE MORNING AND 6 CAPSULES BY MOUTH IN THE EVENING WITH FOOD       Total time spent with the patient was 30 minutes, of which 50% or more was spent in counseling and coordination of care.   Elveria Rising NP-C

## 2017-12-17 NOTE — Patient Instructions (Signed)
Thank you for coming in today.   Instructions for you until your next appointment are as follows: 1. I sent in the refill for Michele [name]'s Levocarnitine to the pharmacy electronically. I have printed the prescriptions for her seizure medications and given to you.  2. Please let me know if Michele [name] has any more seizures or if you have any other concerns. 3. I have written a note for Michele [name] to return the day program and for your work.  3. I would like to see Michele [name] back for follow up in 6 months or sooner if needed.

## 2017-12-19 ENCOUNTER — Encounter (INDEPENDENT_AMBULATORY_CARE_PROVIDER_SITE_OTHER): Payer: Self-pay | Admitting: Family

## 2017-12-23 ENCOUNTER — Other Ambulatory Visit: Payer: Self-pay | Admitting: Student in an Organized Health Care Education/Training Program

## 2017-12-23 DIAGNOSIS — R600 Localized edema: Secondary | ICD-10-CM

## 2017-12-24 ENCOUNTER — Other Ambulatory Visit: Payer: Self-pay

## 2017-12-25 ENCOUNTER — Other Ambulatory Visit: Payer: Self-pay | Admitting: Student in an Organized Health Care Education/Training Program

## 2017-12-25 MED ORDER — LACTULOSE 10 GM/15ML PO SOLN: ORAL | 0 refills | Status: DC

## 2017-12-26 ENCOUNTER — Other Ambulatory Visit: Payer: Self-pay

## 2017-12-26 DIAGNOSIS — K219 Gastro-esophageal reflux disease without esophagitis: Secondary | ICD-10-CM

## 2017-12-27 MED ORDER — PANTOPRAZOLE SODIUM 40 MG PO PACK: PACK | ORAL | 5 refills | Status: DC

## 2018-01-07 ENCOUNTER — Telehealth: Payer: Self-pay | Admitting: Student in an Organized Health Care Education/Training Program

## 2018-01-07 ENCOUNTER — Telehealth (INDEPENDENT_AMBULATORY_CARE_PROVIDER_SITE_OTHER): Payer: Self-pay | Admitting: Family

## 2018-01-07 NOTE — Telephone Encounter (Signed)
Patient needs a letter stating that she needs a new wheelchair ramp bc one she has now is unsafe.  [phone] Margaretha Glassing(Loretta pt mom)

## 2018-01-07 NOTE — Telephone Encounter (Signed)
°  Who's calling (name and relationship to patient) : Martin,[name] (Mother) Best contact number: [phone] (M) Provider they see: Elveria Risingina Goodpasture, NP Reason for call:   Mother of patient is calling in regards to needing a letter written by Inetta Fermoina for a new wheelchair ramp for patient. She stated if Inetta Fermoina had any questions to reach out to her she's home all day.

## 2018-01-07 NOTE — Telephone Encounter (Signed)
Letter has been given to [facility]

## 2018-01-07 NOTE — Telephone Encounter (Signed)
°  Who's calling (name and relationship to patient) : Jerilee FieldVeretta (mom) Best contact number: [phone] Provider they see: Blane OharaGoodpasture  Reason for call: Mom dropped of a note for Elveria Risingina Goodpasture concerning patient needing a new wheelchair ramp.    Put note in [name]'s mailbox   PRESCRIPTION REFILL ONLY  Name of prescription:  Pharmacy:

## 2018-01-09 ENCOUNTER — Encounter: Payer: Self-pay | Admitting: Student in an Organized Health Care Education/Training Program

## 2018-01-09 NOTE — Telephone Encounter (Signed)
I have printed and mailed letter out to patient.

## 2018-01-09 NOTE — Telephone Encounter (Signed)
Michele [name] [phone] came by office inquiring about still needing the letter from PCP in order for patient to get a wheelchair ramp.  Thanks.

## 2018-01-09 NOTE — Telephone Encounter (Signed)
Copied from other office phone note. Lamonte SakaiZimmerman Rumple, Glenyce Randle D, CMA      Gennette PacSells, Lynette D 6 minutes ago (9:04 AM)      Dorna MaiLoretta Martin [phone] came by office inquiring about still needing the letter from PCP in order for patient to get a wheelchair ramp.  Thanks.      Documentation

## 2018-01-09 NOTE — Telephone Encounter (Signed)
Letter written to be sent to the patient and routed to front desk. Also available in epic.

## 2018-01-09 NOTE — Telephone Encounter (Signed)
Contacted mom and informed her of this and also printed letter and placed up front for mom to pick up. Lamonte SakaiZimmerman Rumple, April D, [place]

## 2018-01-10 NOTE — Telephone Encounter (Signed)
Please let Mom know that the letter has been written and is at the front desk for pick up. Thanks, Inetta Fermoina

## 2018-01-11 ENCOUNTER — Telehealth (INDEPENDENT_AMBULATORY_CARE_PROVIDER_SITE_OTHER): Payer: Self-pay | Admitting: Family

## 2018-01-11 NOTE — Telephone Encounter (Signed)
°  Who's calling (name and relationship to patient) : Michele [name] (mom)  Best contact number:  [phone]  Provider they see: Blane OharaGoodpasture  Reason for call: Mom LVM at 8:36am that she received a call from office on yesterday and was returning call.  I call Mrs Michele [name] back at 8:51am that the letter was ready for pickup from Elveria Risingina Goodpasture.      PRESCRIPTION REFILL ONLY  Name of prescription:  Pharmacy:

## 2018-01-16 ENCOUNTER — Telehealth: Payer: Self-pay

## 2018-01-16 NOTE — Telephone Encounter (Signed)
Michele ClinesMary [name] from the [facility], needs the letter and certificate of medical necessity that she faxed over for patient's wheelchair ramp signed as soon as possible and returned. The papers are in PCPs mailbox.  Call back is [phone]  Ples SpecterAlisa Brake, RN [facility] RN)

## 2018-01-17 ENCOUNTER — Encounter: Payer: Self-pay | Admitting: Family Medicine

## 2018-01-17 ENCOUNTER — Other Ambulatory Visit: Payer: Self-pay

## 2018-01-17 ENCOUNTER — Telehealth: Payer: Self-pay

## 2018-01-17 ENCOUNTER — Ambulatory Visit (INDEPENDENT_AMBULATORY_CARE_PROVIDER_SITE_OTHER): Payer: Medicare Other | Admitting: Family Medicine

## 2018-01-17 VITALS — BP 100/82 | HR 119 | Temp 100.2°F | Ht 59.0 in

## 2018-01-17 DIAGNOSIS — R4689 Other symptoms and signs involving appearance and behavior: Secondary | ICD-10-CM

## 2018-01-17 NOTE — Assessment & Plan Note (Signed)
Patient was reported by day program staff as "not as interactive as normal" so mom brought her in.  Mom reports daughter at baseline since she picked her up.  No pain expressed, afebrile, no changes in food/bowel/urine lately.  Return precautions given with school note and sent home with mom in agreement with plan.  Cath was offered for urine sample but shared decision making was employed and it was decided against

## 2018-01-17 NOTE — Patient Instructions (Signed)
It was a pleasure to see you today! Thank you for choosing [facility] for your primary care. Michele [name] was seen for "not acting normal" at daycare. Come back to the clinic if you have any other routine questions, and go to the emergency room if you get a temperature above 100.4 since fevers often trigger seizures for you, if you can't eat regularly or if you have any significant bowel/urine changes.  If you don't hear from us in two weeks, please give us a call to verify your results. Otherwise, we look forward to seeing you again at your next visit. If you have any questions or concerns before then, please call the clinic at [phone].   Please bring all your medications to every doctors visit   Sign up for My Chart to have easy access to your labs results, and communication with your Primary care physician.     Please check-out at the front desk before leaving the clinic.     Best,  Dr. Marthenia RollingScott Fate [name] FAMILY MEDICINE RESIDENT - PGY2 01/17/2018 3:18 PM

## 2018-01-17 NOTE — Telephone Encounter (Signed)
Patient mother drove up with patient at 1215 wanting her to be seen. Stated day care had asked for her to be picked up because she "wasn't acting right". Patient does not appear to be in distress. Patient is non-verbal. O2 sat is 96%, T 98.7 axillary, BP 107/88. Patient mother thinks she is acting "off". Would prefer to wait for appt rather than go to Urgent Care. Appt made for today at 245. Patient mother will drop off her great-grandchild and then come back.   Ples SpecterAlisa Justino Boze, RN [facility])

## 2018-01-17 NOTE — Progress Notes (Signed)
    Subjective:  Michele [name] is a 32 y.o. female who [facility] with a chief complaint of "not acting right at daycare".   HPI: Her day program called mom and was concerns because she was not as interactive as normal (baseline is nonverbal, but will make eyecontact, smile, tear papers, and clap).  They did not report any sick symtpoms, traumas or seizure symptoms.  Just that she was less interactive.  On ride here, mom thought she was basically acting at her baseline.  Mom was concerned about 100.2 axillary temp by our nurse but said her daughter was otherwise well appearing.  Last seizure was ~2843month ago.    Objective:  Physical Exam: BP 100/82   Pulse (!) 119   Temp 100.2 F (37.9 C) (Axillary)   Ht 4\' 11"  (1.499 m)   LMP 01/05/2018   SpO2 95%   BMI 43.22 kg/m   Gen: NAD, resting comfortably, non-verbal (only grunts),  In wheelchair CV: RRR with no murmurs appreciated Pulm: NWOB, CTAB with no crackles, wheezes, or rhonchi MSK: no pitting edema, cyanosis, or clubbing noted Skin: warm, dry Neuro: not moving legs, but was able to clap at will and tear paper, no facial droop.  At baseline per mom Psych:not assesable given patient's non-communicative status  No results found for this or any previous visit (from the past 72 hour(s)).   Assessment/Plan:  Change in behavior Patient was reported by day program staff as "not as interactive as normal" so mom brought her in.  Mom reports daughter at baseline since she picked her up.  No pain expressed, afebrile, no changes in food/bowel/urine lately.  Return precautions given with school note and sent home with mom in agreement with plan.  Michele [name] was offered for urine sample but shared decision making was employed and it was decided against   Marthenia RollingScott Ludie Hudon, DO FAMILY MEDICINE RESIDENT - PGY2 01/17/2018 9:35 PM

## 2018-01-17 NOTE — Telephone Encounter (Signed)
Form and letter completed and placed in fax box

## 2018-01-24 NOTE — Telephone Encounter (Signed)
Letter has been picked up 

## 2018-01-25 ENCOUNTER — Other Ambulatory Visit (INDEPENDENT_AMBULATORY_CARE_PROVIDER_SITE_OTHER): Payer: Self-pay | Admitting: Family

## 2018-01-25 DIAGNOSIS — G40319 Generalized idiopathic epilepsy and epileptic syndromes, intractable, without status epilepticus: Secondary | ICD-10-CM

## 2018-02-06 ENCOUNTER — Encounter (INDEPENDENT_AMBULATORY_CARE_PROVIDER_SITE_OTHER): Payer: Self-pay | Admitting: Orthopaedic Surgery

## 2018-02-06 ENCOUNTER — Ambulatory Visit (INDEPENDENT_AMBULATORY_CARE_PROVIDER_SITE_OTHER): Payer: Medicare Other

## 2018-02-06 ENCOUNTER — Ambulatory Visit (INDEPENDENT_AMBULATORY_CARE_PROVIDER_SITE_OTHER): Payer: Medicare Other | Admitting: Orthopaedic Surgery

## 2018-02-06 VITALS — Ht 59.0 in | Wt 214.0 lb

## 2018-02-06 DIAGNOSIS — M25572 Pain in left ankle and joints of left foot: Secondary | ICD-10-CM | POA: Diagnosis not present

## 2018-02-06 NOTE — Progress Notes (Signed)
Office Visit Note   Patient: Michele [name]           Date of Birth: 30-Jun-1985           MRN: [id number] Visit Date: 02/06/2018              Requested by: Howard PouchFeng, Lauren, MD [address] [place], [place] [address] PCP: Howard PouchFeng, Lauren, MD   Assessment & Plan: Visit Diagnoses:  1. Pain in left ankle and joints of left foot   2. Morbid obesity (HCC)     Plan: We discussed dietary changes for healthier food and decreased volume of food intake since she is gradually gaining weight and BMI is greater than 40.  We discussed potential future ambulation problems with continued weight gain.  She has planovalgus foot deformities but no new acute fractures.  Her family states she is what I was put in front of her and we discussed changing her diet with slightly less volume and healthier food intake with less calories.  X-rays today demonstrated no new fractures.  Follow-Up Instructions: No follow-ups on file.   Orders:  Orders Placed This Encounter  Procedures  . XR Foot Complete Left   No orders of the defined types were placed in this encounter.     Procedures: No procedures performed   Clinical Data: No additional findings.   Subjective: Chief Complaint  Patient presents with  . Left Foot - Pain    HPI 32 year old female with cerebral palsy and also Lennox-Gaustat syndrome is seen with altered gait and slight limp.  She has planovalgus foot deformity bilaterally and is gradually gained weight over several years.  Past history of left fourth and fifth metatarsal fracture prior to 2017 and previous x-rays and CT scan are available for comparison.  She also had a right tibial fracture which may been a stress fracture with interval healing periosteal callus formation noted on 10/23/2016 office visit.  Patient is followed by Dr. Mosetta PuttFeng at the family practice clinic.  Review of Systems review of systems positive for CP nonverbal communication, history of seizure disorder, pneumonia, obesity,  history of incontinence, previous right tibia fracture left fourth fifth metatarsal fractures.  Otherwise negative as it pertains to HPI 14 point review of systems updated.   Objective: Vital Signs: Ht 4\' 11"  (1.499 m)   Wt 214 lb (97.1 kg)   BMI 43.22 kg/m   Physical Exam  Constitutional: She is oriented to person, place, and time. She appears well-developed.  Obese.  Child verbalizes but does not talk.  HENT:  Head: Normocephalic.  Right Ear: External ear normal.  Left Ear: External ear normal.  Eyes: Pupils are equal, round, and reactive to light.  Neck: No tracheal deviation present. No thyromegaly present.  Cardiovascular: Normal rate.  Pulmonary/Chest: Effort normal.  Abdominal: Soft.  Neurological: She is alert and oriented to person, place, and time.  Skin: Skin is warm and dry.  Psychiatric: She has a normal mood and affect. Her behavior is normal.    Ortho Exam well-healed old scar of the dorsum of her foot.  No tenderness over the metatarsals.  Increased adipose tissue over the forefoot.  Bilateral planovalgus.  Subtalar motion is normal she ambulates in the hall without limping.  No adduction hip tightness.  No significant hamstring contracture.  Ankles dorsiflex to neutral and she walks with a flatfoot gait but is not toe walking.  Specialty Comments:  No specialty comments available.  Imaging: Xr Foot Complete Left  Result Date: 02/06/2018 Three-view x-rays left foot obtained and reviewed.  There is some degenerative spurring of the talonavicular joint.  Remodeling changes in the fourth and fifth metatarsal and previous fracture unchanged from comparison 2017 x-rays.  No acute changes are noted.  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  Talonavicular degenerative changes.    PMFS History: Patient Active Problem List   Diagnosis Date Noted  . Change in behavior 01/17/2018  . CAP (community  acquired pneumonia) 11/09/2017  . Moniliasis   . Lennox-Gastaut syndrome (HCC) 07/06/2017  . Acute decrease in level of consciousness 06/29/2017  . Lymphedema 01/22/2017  . Arterial hypotension   . Sepsis (HCC) 11/04/2016  . Other constipation   . Bilateral lower extremity edema 04/21/2016  . Unsteadiness on feet   . Rhinitis, non-allergic 06/23/2015  . Special screening examination for human papillomavirus (HPV) 07/20/2014  . Amenorrhea 05/19/2014  . 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 Long-term use of high-risk medication 12/24/2012  . Morbid obesity (HCC) 12/24/2012  . 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 URINARY INCONTINENCE 02/25/2010  . Full incontinence of feces 02/25/2010  . 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 Hypothyroidism 01/29/2009  . 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 Cerebral palsy (HCC) 01/29/2009  . GASTROESOPHAGEAL REFLUX DISEASE 01/29/2009  . Osteoporosis 01/29/2009  . Seizure disorder (HCC) 01/29/2009   Past Medical History:  Diagnosis Date  . Cerebral palsy (HCC)   . Hypothyroidism   . Lennox-Gastaut syndrome (HCC)   . Osteoporosis   . Pneumonia   . Profound mental retardation   . Reflux   . Rickets   . Seizures (HCC)     Family History  Problem Relation Age of Onset  . Hypertension Mother   . Heart disease Maternal Grandmother        Died at 464  . Stroke Maternal Grandfather        died at 4479  . Hypertension Paternal Grandmother   . Diabetes Unknown        MGGM  . Heart disease Unknown        MGGF  . Kidney disease Unknown        Maternal Great Aunt   . Cancer Maternal Aunt        lung  . Colon cancer Neg Hx     Past Surgical History:  Procedure Laterality Date  . FOOT MASS EXCISION Right   . Gastrocutaneous fistual repair  2012  . PEG TUBE PLACEMENT    . PEG TUBE REMOVAL  2012 ?   Social History   Occupational History  . Occupation: Disabled   Tobacco  Use  . Smoking status: Never Smoker  . Smokeless tobacco: Never Used  Substance and Sexual Activity  . Alcohol use: No  . Drug use: No  . Sexual activity: Never

## 2018-02-20 ENCOUNTER — Ambulatory Visit (INDEPENDENT_AMBULATORY_CARE_PROVIDER_SITE_OTHER): Payer: Medicare Other | Admitting: Family Medicine

## 2018-02-20 DIAGNOSIS — B372 Candidiasis of skin and nail: Secondary | ICD-10-CM | POA: Diagnosis not present

## 2018-02-20 MED ORDER — CLOTRIMAZOLE 1 % EX CREA: 1.0000 "application " | TOPICAL_CREAM | Freq: Two times a day (BID) | CUTANEOUS | 1 refills | Status: DC

## 2018-02-20 NOTE — Progress Notes (Signed)
   Subjective:   Patient ID: Michele BIRCHENOUGH    DOB: 1986-01-06, 32 y.o. female   MRN: [id number]  CC: yeast infection in skin folds   HPI: Michele [name] is a 32 y.o. female who presents to clinic today for the following issue.  Yeast infection Mother noticed last Wednesday/Thursday, she started using nystatin powder topically which helped minimally.  Also has been applying protective cream containing zinc oxide in affected areas.  Rash is within groin skin folds.  No vaginal discharge or rash in lower areas.  She has been itching the areas.  No fever, chills, nausea, vomiting. Wears loose clothing.  She wears diapers for urinary and fecal incontinence.  Mother would like something else prescribed for this.   ROS: See HPI for pertinent ROS.  Social: pt is a never smoker.  Medications reviewed.  Objective:   BP 100/80   Pulse 82   Temp 97.7 F (36.5 C) (Axillary)   LMP 02/11/2018   SpO2 97%  Vitals and nursing note reviewed.  General: 31 yo female, nonverbal at baseline, mother at visit, NAD CV: RRR no MRG  Lungs: CTAB, normal effort  Abdomen: soft, NTND, +bs Skin: warm, dry.  Erythema and mild skin breakdown in skin folds without surrounding warmth noted beneath abdominal fold, no open lesions, skin is moist Neuro: alert, nonverbal   Assessment & Plan:   Yeast dermatitis Advised to keep area dry following bathing, avoid tight clothing. Has been using topical nystatin powder with minimal improvement.  Treat with topical clotrimazole.  Follow up if no improvement or worsening.   Meds ordered this encounter  Medications  . clotrimazole (LOTRIMIN) 1 % cream    Sig: Apply 1 application topically 2 (two) times daily.    Dispense:  30 g    Refill:  1   Freddrick March, MD [facility], PGY-3 03/01/2018 8:42 AM

## 2018-02-20 NOTE — Patient Instructions (Signed)
It was nice seeing you today!  Debbe was seen in clinic for yeast infection.  I am prescribing her a topical ointment you can apply twice daily to the affected areas.  Additionally as we discussed, it is advisable to wear loose clothing and breathable cottons/linens as this usually occurs in areas that sweat. Make sure that your skin is completely dry after bathing before you dressed.  Please call clinic if you have any questions.  Be well,  Freddrick March MD

## 2018-02-21 ENCOUNTER — Ambulatory Visit: Payer: Medicare Other

## 2018-02-22 ENCOUNTER — Other Ambulatory Visit: Payer: Self-pay | Admitting: Student in an Organized Health Care Education/Training Program

## 2018-03-01 NOTE — Assessment & Plan Note (Signed)
Advised to keep area dry following bathing, avoid tight clothing. Has been using topical nystatin powder with minimal improvement.  Treat with topical clotrimazole.  Follow up if no improvement or worsening.

## 2018-03-20 ENCOUNTER — Other Ambulatory Visit: Payer: Self-pay | Admitting: Student in an Organized Health Care Education/Training Program

## 2018-03-20 DIAGNOSIS — R600 Localized edema: Secondary | ICD-10-CM

## 2018-03-23 ENCOUNTER — Other Ambulatory Visit: Payer: Self-pay | Admitting: Student in an Organized Health Care Education/Training Program

## 2018-04-12 ENCOUNTER — Ambulatory Visit (INDEPENDENT_AMBULATORY_CARE_PROVIDER_SITE_OTHER): Payer: Medicare Other | Admitting: *Deleted

## 2018-04-12 DIAGNOSIS — Z23 Encounter for immunization: Secondary | ICD-10-CM | POA: Diagnosis not present

## 2018-04-20 ENCOUNTER — Other Ambulatory Visit (INDEPENDENT_AMBULATORY_CARE_PROVIDER_SITE_OTHER): Payer: Self-pay | Admitting: Family

## 2018-04-20 DIAGNOSIS — G40319 Generalized idiopathic epilepsy and epileptic syndromes, intractable, without status epilepticus: Secondary | ICD-10-CM

## 2018-05-23 ENCOUNTER — Ambulatory Visit (INDEPENDENT_AMBULATORY_CARE_PROVIDER_SITE_OTHER): Payer: Medicare Other | Admitting: Student in an Organized Health Care Education/Training Program

## 2018-05-23 ENCOUNTER — Encounter: Payer: Self-pay | Admitting: Student in an Organized Health Care Education/Training Program

## 2018-05-23 ENCOUNTER — Other Ambulatory Visit: Payer: Self-pay

## 2018-05-23 VITALS — BP 98/78 | HR 77 | Temp 97.2°F | Ht 59.0 in | Wt 214.0 lb

## 2018-05-23 DIAGNOSIS — R609 Edema, unspecified: Secondary | ICD-10-CM | POA: Diagnosis not present

## 2018-05-23 NOTE — Patient Instructions (Signed)
It was a pleasure seeing you today in our clinic. Here is the treatment plan we have discussed and agreed upon together:  You received a script for a lift chair. Please let me know if there is anything else I can do to help with this.  Please continue to use compression stockings, leg elevation and low salt to help with leg swelling.  Our clinic's number is [phone]. Please call with questions or concerns about what we discussed today.  Be well, Dr. Mosetta PuttFeng

## 2018-05-23 NOTE — Progress Notes (Signed)
   CC: check up, LE swelling  HPI: Michele [name] is a 32 y.o. female  Who presents today for LE edema. History provided by mother. Patient is nonverbal.  Past Medical History:  Diagnosis Date  . Cerebral palsy (HCC)   . Hypothyroidism   . Lennox-Gastaut syndrome (HCC)   . Osteoporosis   . Pneumonia   . Profound mental retardation   . Reflux   . Rickets   . Seizures (HCC)      Bilateral lower extremity swelling This is a chronic problem. Compression stockings are used consistently. Her mother asks for a paper script for a lift chair which would allow her to have her legs elevated without having to be transferred out of the chair at school. No calf tenderness. No dyspnea or cough. No chest pain. She has been acting like herself.   Days home from school Patient goes to a day program. Her mother has kept her home because other people in the program were noted to be sneezing and coughing. Michele [name] has been fine. No infectious symptoms, no rhinorrhea or congestion, no abdominal pain, no N/V. She has been acting like herself. Mom would like a note to keep Michele [name] out of the daycare program until Monday because she is concerned that Michele [name] becomes sick very easily.   Review of Symptoms:  See HPI for ROS.   CC, SH/smoking status, and VS noted.  Objective: BP 98/78   Pulse 77   Temp (!) 97.2 F (36.2 C) (Axillary)   Ht 4\' 11"  (1.499 m)   Wt 214 lb (97.1 kg)   LMP 05/01/2018 (Approximate)   SpO2 96%   BMI 43.22 kg/m  GEN: NAD, profound mental retardation, non-verbal, wheelchair bound NECK: full ROM, no thyromegaly RESPIRATORY: clear to auscultation bilaterally with no wheezes, rhonchi or rales CV: RRR, no m/r/g, 1-2+edema LE bil without calf tenderness, no erythema GI: soft, non-tender, non-distended SKIN: warm and dry, no rashes or lesions PSYCH: AAOx3, appropriate affect   Assessment and plan:  LE edema Continue compression stockings, leg elevation. Paper script for chair that  allows leg elevation was provided. Advised to consider low salt diet  Letter for school - patient is not sick but mom feels strongly about keeping her home until Monday. She goes to a daily prograM. Mom discussed that she does not want Jalise exposed to viral illnesses. Advised that this may be difficult to avoid completely during this season however note was provided.  Howard PouchLauren Lakaisha Danish, MD,MS,  PGY3 05/23/2018 5:00 PM

## 2018-05-25 ENCOUNTER — Encounter (HOSPITAL_COMMUNITY): Payer: Self-pay

## 2018-05-25 ENCOUNTER — Ambulatory Visit (HOSPITAL_COMMUNITY)
Admission: EM | Admit: 2018-05-25 | Discharge: 2018-05-25 | Disposition: A | Discharge status: Home / Self Care | Payer: Medicare Other | Attending: Radiology | Admitting: Radiology

## 2018-05-25 DIAGNOSIS — J069 Acute upper respiratory infection, unspecified: Secondary | ICD-10-CM | POA: Diagnosis not present

## 2018-05-25 MED ORDER — CETIRIZINE HCL 1 MG/ML PO SOLN: 10.0000 mg | Freq: Every day | ORAL | 0 refills | Status: DC

## 2018-05-25 MED ORDER — POLYMYXIN B-TRIMETHOPRIM 10000-0.1 UNIT/ML-% OP SOLN: 1.0000 [drp] | OPHTHALMIC | 0 refills | Status: DC

## 2018-05-25 NOTE — ED Provider Notes (Signed)
[facility]    CSN: [id number] Arrival date & time: 05/25/18  1003     History   Chief Complaint Chief Complaint  Patient presents with  . Eye Drainage    HPI Michele [name] is a 32 y.o. female.   32 year old female came in complaining of eye discharge from bilateral eyes worse on left than right and upper respiratory congestion.  Mother at bedside states that patient attends a adult daycare where other students have similar signs and symptoms.  Mother denies any cough fever nausea vomiting or diarrhea.  No other known sick contacts.  Condition is acute in nature.  Condition made better by nothing.  Condition is made worse by nothing.  Mother denies any treatment prior to arrival at this facility.     Past Medical History:  Diagnosis Date  . Cerebral palsy (HCC)   . Hypothyroidism   . Lennox-Gastaut syndrome (HCC)   . Osteoporosis   . Pneumonia   . Profound mental retardation   . Reflux   . Rickets   . Seizures [facility](HCC)     Patient Active Problem List   Diagnosis Date Noted  . Change in behavior 01/17/2018  . CAP (community acquired pneumonia) 11/09/2017  . Moniliasis   . Lennox-Gastaut syndrome (HCC) 07/06/2017  . Acute decrease in level of consciousness 06/29/2017  . Lymphedema 01/22/2017  . Arterial hypotension   . Sepsis (HCC) 11/04/2016  . Other constipation   . Bilateral lower extremity edema 04/21/2016  . Unsteadiness on feet   . Rhinitis, non-allergic 06/23/2015  . Special screening examination for human papillomavirus (HPV) 07/20/2014  . Amenorrhea 05/19/2014  . 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 Long-term use of high-risk medication 12/24/2012  . Morbid obesity (HCC) 12/24/2012  . 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 Yeast dermatitis 02/01/2011  . URINARY INCONTINENCE 02/25/2010  . Full incontinence of feces 02/25/2010  . 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 Hypothyroidism  01/29/2009  . 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 Cerebral palsy (HCC) 01/29/2009  . GASTROESOPHAGEAL REFLUX DISEASE 01/29/2009  . Osteoporosis 01/29/2009  . Seizure disorder (HCC) 01/29/2009    Past Surgical History:  Procedure Laterality Date  . FOOT MASS EXCISION Right   . Gastrocutaneous fistual repair  2012  . PEG TUBE PLACEMENT    . PEG TUBE REMOVAL  2012 ?    OB History   None      Home Medications    Prior to Admission medications   Medication Sig Start Date End Date Taking? Authorizing Provider  acetaminophen (TYLENOL) 160 MG/5ML suspension Take 320 mg by mouth every 6 (six) hours as needed for mild pain.     [provider]  acetaminophen (TYLENOL) 650 MG suppository Place 650 mg rectally every 8 (eight) hours as needed for mild pain.    [provider]  Mariane DuvalBANZEL 40 MG/ML SUSP Take 5ml in the morning and 5ml at night 12/17/17   Elveria RisingGoodpasture, [name], NP  Calcium Carbonate-Vitamin D (CALCARB 600/D) 600-400 MG-UNIT tablet Take 1 tablet by mouth daily. Only gives once a week.    [provider]  clotrimazole (GYNE-LOTRIMIN) 1 % vaginal cream Place 1 Applicatorful vaginally at bedtime. 11/12/17   Freddrick MarchAmin, Yashika, MD  clotrimazole (LOTRIMIN) 1 % cream Apply 1 application topically 2 (two) times daily. 02/20/18   Freddrick MarchAmin, Yashika, MD  DEPAKOTE SPRINKLES 125 MG capsule TAKE 8 CAPSULES BY MOUTH TWICE DAILY 12/17/17   Elveria RisingGoodpasture, [name], NP  DIASTAT ACUDIAL 20  MG GEL DIAL TO 15 MG, GIVE RECTALLY FOR SEIZURES LASTING 2 MINUTES OR LONGER 01/25/18   Elveria Rising, NP  feeding supplement, ENSURE ENLIVE, (ENSURE ENLIVE) LIQD Take 237 mLs by mouth 3 (three) times daily between meals. 04/11/16   Beaulah Dinning, MD  fluticasone (FLONASE) 50 MCG/ACT nasal spray SHAKE LIQUID AND USE 2 SPRAYS IN [facility] NOSTRIL DAILY 05/04/17   Leland Her, DO  furosemide (LASIX) 20 MG tablet TAKE 1 TABLET BY MOUTH DAILY AS NEEDED FOR FLUID RETENTION 03/21/18   Howard Pouch, MD    guaiFENesin (ROBITUSSIN) 100 MG/5ML liquid Take 200 mg by mouth as needed for cough or congestion.    [provider]  hydrocortisone cream 0.5 % Apply 1 application topically 2 (two) times daily. 10/06/15   Raliegh Ip, DO  ibuprofen (ADVIL,MOTRIN) 200 MG tablet Take 400 mg by mouth as needed for moderate pain.    [provider]  KEPPRA 100 MG/ML solution Take 12ml by mouth every morning and 12ml by mouth at night 12/17/17   Elveria Rising, NP  lactulose (CHRONULAC) 10 GM/15ML solution TAKE 30 MLS BY MOUTH TWICE DAILY 02/26/18   Howard Pouch, MD  levOCARNitine (CARNITOR) 1 GM/10ML solution TAKE 5 MLS BY MOUTH THREE TIMES DAILY 04/22/18   Elveria Rising, NP  liver oil-zinc oxide (DESITIN) 40 % ointment Apply 1 application topically daily.    [provider]  loperamide (IMODIUM) 2 MG capsule Take 1 capsule (2 mg total) by mouth as needed for diarrhea or loose stools. 11/12/17   Freddrick March, MD  nystatin (NYSTATIN) powder APPLY TO AFFECTED DAIPER AREAS WITH EVERY DIAPER CHANGE UNTIL RASH CLEARS 03/25/18   Howard Pouch, MD  ondansetron [facility]) 4 MG/5ML solution TAKE 5 MLS FOR PAIN EVERY 8 HOURS AS NEEDED FOR NAUSEA OR VOMITING 06/29/14   Hess, Judie Grieve R, DO  pantoprazole sodium (PROTONIX) 40 mg/20 mL PACK MIX AND GIVE 1 PACKET DAILY AS DIRECTED 12/27/17   Howard Pouch, MD  TOPAMAX SPRINKLE 25 MG capsule TAKE 6 CAPSULES BY MOUTH IN THE MORNING AND 6 CAPSULES BY MOUTH IN THE EVENING WITH FOOD 12/17/17   Elveria Rising, NP    Family History Family History  Problem Relation Age of Onset  . Hypertension Mother   . Heart disease Maternal Grandmother        Died at 47  . Stroke Maternal Grandfather        died at 44  . Hypertension Paternal Grandmother   . Diabetes Unknown        MGGM  . Heart disease Unknown        MGGF  . Kidney disease Unknown        Maternal Great Aunt   . Cancer Maternal Aunt        lung  . Colon cancer Neg Hx     Social  History Social History   Tobacco Use  . Smoking status: Never Smoker  . Smokeless tobacco: Never Used  Substance Use Topics  . Alcohol use: No  . Drug use: No     Allergies   Carbamazepine   Review of Systems Review of Systems  Constitutional: Positive for fever.  HENT: Positive for congestion.        Eye discharge     Physical Exam Triage Vital Signs ED Triage Vitals  Enc Vitals Group     BP 05/25/18 1025 117/80     Pulse Rate 05/25/18 1025 80     Resp 05/25/18 1025 18  Temp 05/25/18 1025 97.6 F (36.4 C)     Temp Source 05/25/18 1025 Oral     SpO2 05/25/18 1025 99 %     Weight --      Height --      Head Circumference --      Peak Flow --      Pain Score 05/25/18 1029 0     Pain Loc --      Pain Edu? --      Excl. in GC? --    No data found.  Updated Vital Signs BP 117/80 (BP Location: Right Arm)   Pulse 80   Temp 97.6 F (36.4 C) (Oral)   Resp 18   LMP 05/01/2018 (Approximate)   SpO2 99%   Visual Acuity Right Eye Distance:   Left Eye Distance:   Bilateral Distance:    Right Eye Near:   Left Eye Near:    Bilateral Near:     Physical Exam  Constitutional: She is oriented to person, place, and time. She appears well-developed and well-nourished.  HENT:  Head: Normocephalic and atraumatic.  Eyes: Conjunctivae are normal.  Clear discharge noted from bilateral eyes worse on left than right.  Nasal congestion noted bilateral nares clear in nature.  Neck: Normal range of motion.  Pulmonary/Chest: Effort normal.  Musculoskeletal: Normal range of motion.  Neurological: She is alert and oriented to person, place, and time.  Skin: Skin is warm.  Psychiatric: She has a normal mood and affect.  Nursing note and vitals reviewed.    UC Treatments / Results  Labs (all labs ordered are listed, but only abnormal results are displayed) Labs Reviewed - No data to display  EKG None  Radiology No results found.  Procedures Procedures  (including critical care time)  Medications Ordered in UC Medications - No data to display  Initial Impression / Assessment and Plan / UC Course  I have reviewed the triage vital signs and the nursing notes.  Pertinent labs & imaging results that were available during my care of the patient were reviewed by me and considered in my medical decision making (see chart for details).      Final Clinical Impressions(s) / UC Diagnoses   Final diagnoses:  None   Discharge Instructions   None    ED Prescriptions    None     Controlled Substance Prescriptions [place] Controlled Substance Registry consulted? Not Applicable   Alene Mires, NP 05/25/18 1110

## 2018-05-25 NOTE — ED Triage Notes (Signed)
Pt present eye drainage in both eyes that started last night.

## 2018-05-25 NOTE — ED Notes (Signed)
Staff is unable to do a visual acuity on patient,because of  her mental disabilities

## 2018-06-17 ENCOUNTER — Other Ambulatory Visit (INDEPENDENT_AMBULATORY_CARE_PROVIDER_SITE_OTHER): Payer: Self-pay | Admitting: Family

## 2018-06-17 ENCOUNTER — Ambulatory Visit (INDEPENDENT_AMBULATORY_CARE_PROVIDER_SITE_OTHER): Payer: Medicare Other | Admitting: Family Medicine

## 2018-06-17 ENCOUNTER — Other Ambulatory Visit: Payer: Self-pay

## 2018-06-17 VITALS — BP 110/75 | HR 76 | Temp 97.6°F

## 2018-06-17 DIAGNOSIS — H1031 Unspecified acute conjunctivitis, right eye: Secondary | ICD-10-CM | POA: Diagnosis not present

## 2018-06-17 DIAGNOSIS — H109 Unspecified conjunctivitis: Secondary | ICD-10-CM | POA: Insufficient documentation

## 2018-06-17 DIAGNOSIS — G40319 Generalized idiopathic epilepsy and epileptic syndromes, intractable, without status epilepticus: Secondary | ICD-10-CM

## 2018-06-17 MED ORDER — BANZEL 40 MG/ML PO SUSP: ORAL | 5 refills | Status: DC

## 2018-06-17 MED ORDER — DEPAKOTE SPRINKLES 125 MG PO CSDR: DELAYED_RELEASE_CAPSULE | ORAL | 5 refills | Status: DC

## 2018-06-17 MED ORDER — ERYTHROMYCIN 5 MG/GM OP OINT: 1.0000 "application " | TOPICAL_OINTMENT | Freq: Four times a day (QID) | OPHTHALMIC | 0 refills | Status: AC

## 2018-06-17 MED ORDER — TOPAMAX SPRINKLE 25 MG PO CPSP: ORAL_CAPSULE | ORAL | 5 refills | Status: DC

## 2018-06-17 MED ORDER — LEVOCARNITINE 1 GM/10ML PO SOLN: ORAL | 5 refills | Status: DC

## 2018-06-17 NOTE — Progress Notes (Signed)
  Subjective:   Patient ID: Michele [name]    DOB: 09-19-1985, 32 y.o. female   MRN: [id number]  Michele PineRobin C [name] is a 32 y.o. female with a history of hypothyroidism, GERD, cerebral palsy, seizure d/o, congenital quadriplegia, ID here for   EYE COMPLAINT Mom states she was doing patient's hair yesterday and thinks she may have nicked her eye in the process.  And she woke up this morning had matting and redness to her right eye.  She tried some ointment on her eye that she received from her other daughter but is unsure what the name of the medication is.  She also tried some Polytrim that she received from the urgent care 12/7 for bilateral eye clear watery discharge.  States symptoms from 12/7 improved with Polytrim and Zyrtec, was told symptoms were from allergies.  Does not feel the Polytrim has helped this time but only used once this morning.  Patient does not wear glasses or contact lens.  Denies recent eye surgeries.  Mom denies nasal discharge, ear discharge, sneezing, vomiting, fevers.  Review of Systems:  Per HPI.  PMFSH, medications and smoking status reviewed.  Objective:   BP 110/75   Pulse 76   Temp 97.6 F (36.4 C) (Axillary)   SpO2 93%  Vitals and nursing note reviewed.  General: Obese female, wheelchair-bound, in no acute distress with non-toxic appearance HEENT: normocephalic, atraumatic, moist mucous membranes.  Right conjunctiva injected with tearing.  Mattering noted to the right eye.  Unable to perform Snellen exam.  TMs clear bilaterally.  Assessment & Plan:   Conjunctivitis Likely irritation from trauma.  PERRL, no obvious damage to cornea or pupil.  Injected conjunctiva to right eye.  Given no frank discharge, conjunctivitis may not be bacterial however exam difficult due to lack of patient cooperation.  Unable to perform visual acuity.   Will provide erythromycin ointment for empiric treatment of bacterial inoculation during trauma.  No orders of the defined types were  placed in this encounter.  Meds ordered this encounter  Medications  . erythromycin ophthalmic ointment    Sig: Place 1 application into the right eye 4 (four) times daily for 5 days.    Dispense:  20 g    Refill:  0    Ellwood DenseAlison Candy Leverett, DO PGY-2, [facility] 06/17/2018 12:08 PM

## 2018-06-17 NOTE — Assessment & Plan Note (Signed)
Likely irritation from trauma.  PERRL, no obvious damage to cornea or pupil.  Injected conjunctiva to right eye.  Given no frank discharge, conjunctivitis may not be bacterial however exam difficult due to lack of patient cooperation.  Unable to perform visual acuity.   Will provide erythromycin ointment for empiric treatment of bacterial inoculation during trauma.

## 2018-06-17 NOTE — Telephone Encounter (Signed)
Please send to pharmacy.

## 2018-06-17 NOTE — Patient Instructions (Signed)
It was great to see you!  Our plans for today:  - Use the ointment for the next 5 days. If she does not get better by next week, come back to see us.  Take care and seek immediate care sooner if you develop any concerns.   Dr. Mollie [facility]umball [facility]

## 2018-06-24 ENCOUNTER — Encounter (INDEPENDENT_AMBULATORY_CARE_PROVIDER_SITE_OTHER): Payer: Self-pay | Admitting: Family

## 2018-06-24 ENCOUNTER — Ambulatory Visit (INDEPENDENT_AMBULATORY_CARE_PROVIDER_SITE_OTHER): Payer: Medicare Other | Admitting: Family

## 2018-06-24 VITALS — BP 130/70 | HR 76 | Ht 59.0 in | Wt 217.8 lb

## 2018-06-24 DIAGNOSIS — G40319 Generalized idiopathic epilepsy and epileptic syndromes, intractable, without status epilepticus: Secondary | ICD-10-CM | POA: Diagnosis not present

## 2018-06-24 DIAGNOSIS — F73 Profound intellectual disabilities: Secondary | ICD-10-CM | POA: Diagnosis not present

## 2018-06-24 DIAGNOSIS — R600 Localized edema: Secondary | ICD-10-CM

## 2018-06-24 DIAGNOSIS — G40814 Lennox-Gastaut syndrome, intractable, without status epilepticus: Secondary | ICD-10-CM

## 2018-06-24 DIAGNOSIS — G40909 Epilepsy, unspecified, not intractable, without status epilepticus: Secondary | ICD-10-CM

## 2018-06-24 NOTE — Progress Notes (Signed)
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 She is taking and tolerating brand Depakote, Keppra, Topamax and Banzel for her seizure disorder. Sarabi typically turns blue and requires blow by oxygen when seizures occur. She is completely dependent upon others for all activities of daily living.  Today's concerns:  Mom reports today that [name] has been generally healthy and doing well since her last visit. She has ongoing problems with edema in her feet and lower legs, and Mom plans to talk to her PCP about a referral to a dietician for [name]. She says that [name] has a large appetite and enjoys snacks such as potato chips. Nahal wears anti-embolism stockings but Mom says that they do not seem to reduce her edema. Mom reports that [name] has had occasional very brief non-convulsive seizures since her last visit but no convulsions. With the non-convulsive seizures, she stares, jerks and sometimes raises her arms slightly.   Review of systems: Please see HPI for neurologic and other pertinent review of systems. Otherwise all other systems were reviewed and were negative.  Problem List: Patient Active Problem List   Diagnosis Date Noted  . Conjunctivitis 06/17/2018  . Change in behavior 01/17/2018  . CAP (community acquired pneumonia) 11/09/2017  . Moniliasis   . Lennox-Gastaut syndrome (HCC) 07/06/2017  . Acute decrease in level of consciousness 06/29/2017  . Lymphedema 01/22/2017  . Arterial hypotension   . Sepsis (HCC) 11/04/2016  . Other constipation   . Bilateral lower extremity edema 04/21/2016  . Unsteadiness on feet   . Rhinitis, non-allergic  06/23/2015  . Special screening examination for human papillomavirus (HPV) 07/20/2014  . Amenorrhea 05/19/2014  . 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 Long-term use of high-risk medication 12/24/2012  . Morbid obesity (HCC) 12/24/2012  . 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 Yeast dermatitis 02/01/2011  . URINARY INCONTINENCE 02/25/2010  . Full incontinence of feces 02/25/2010  . 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 Hypothyroidism 01/29/2009  . 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 Cerebral palsy (HCC) 01/29/2009  . GASTROESOPHAGEAL REFLUX DISEASE 01/29/2009  . Osteoporosis 01/29/2009  . Seizure disorder (HCC) 01/29/2009     Past Medical History:  Diagnosis Date  . Cerebral palsy (HCC)   . Hypothyroidism   . Lennox-Gastaut syndrome (HCC)   . Osteoporosis   . Pneumonia   . Profound mental retardation   . Reflux   . Rickets   . Seizures (HCC)     Past medical history comments: see HPI  Surgical history: Past Surgical History:  Procedure Laterality Date  . FOOT MASS EXCISION Right   . Gastrocutaneous fistual repair  2012  . PEG TUBE PLACEMENT    . PEG TUBE REMOVAL  2012 ?     Family history: family history includes Cancer in her maternal aunt; Diabetes in an other family member; Heart disease in her maternal grandmother and another family member; Hypertension in her mother and paternal grandmother; Kidney disease in an other family member; Stroke in her maternal grandfather.   Social history: Social History   Socioeconomic History  . Marital status: Single    Spouse name: Not on file  . Number of children: 0  .  Years of education: Not on file  . Highest education level: Not on file  Occupational History  . Occupation: Disabled   Social Needs  . Financial resource strain: Not on file  . Food insecurity:    Worry: Not on file    Inability: Not on file  . Transportation needs:    Medical: Not  on file    Non-medical: Not on file  Tobacco Use  . Smoking status: Never Smoker  . Smokeless tobacco: Never Used  Substance and Sexual Activity  . Alcohol use: No  . Drug use: No  . Sexual activity: Never  Lifestyle  . Physical activity:    Days per week: Not on file    Minutes per session: Not on file  . Stress: Not on file  Relationships  . Social connections:    Talks on phone: Not on file    Gets together: Not on file    Attends religious service: Not on file    Active member of club or organization: Not on file    Attends meetings of clubs or organizations: Not on file    Relationship status: Not on file  . Intimate partner violence:    Fear of current or ex partner: Not on file    Emotionally abused: Not on file    Physically abused: Not on file    Forced sexual activity: Not on file  Other Topics Concern  . Not on file  Social History Narrative   0 caffeine drinks    Lives with mother who helps ADLs.   Graduated from [employer] in 2010. Now attends day program at [employer].    Enjoys playing in water, ripping paper, watching TV, music and going out.    Past/failed meds: Carbamazepine   Allergies: Allergies  Allergen Reactions  . Carbamazepine Rash     Immunizations: Immunization History  Administered Date(s) Administered  . Influenza Split 04/14/2011, 03/22/2012  . Influenza Whole 05/26/2009, 04/04/2010  . Influenza,inj,Quad PF,6+ Mos 04/03/2013, 04/04/2014, 03/08/2015, 03/08/2016, 03/26/2017, 04/12/2018  . Tdap 01/14/2014     Physical Exam: BP 130/70   Pulse 76   Ht 4\' 11"  (1.499 m)   Wt 217 lb 12.8 oz (98.8 kg)   BMI 43.99 kg/m   General: well developed, well nourished obese female, seated in wheelchair, in no evident distress; black hair, brown eyes, even handed Head: normocephalic and atraumatic. Oropharynx benign. No dysmorphic features. Neck: supple with no carotid bruits. Cardiovascular: regular rate and rhythm, no murmurs. Respiratory:  Clear to auscultation bilaterally Abdomen: Bowel sounds present all four quadrants, abdomen soft, non-tender, non-distended.  Musculoskeletal: Her limbs are difficult to examine due to her excess adipose tissue. She has large amount of edema in her feet today.  Skin: no rashes or neurocutaneous lesions  Neurologic Exam Mental Status: Awake and fully alert. Has no language other than to occasionally say her name "Robbie".  Smiles responsively. Claps her hands repeatedly and grabs at the examiner. Fairly tolerant of invasions into her space but is unable to follow instructions or participate in examination. Cranial Nerves: Fundoscopic exam - red reflex present.  Unable to fully visualize fundus.  Pupils equal briskly reactive to light.  Turns to localize faces and objects in the periphery. Turns to localize sounds in the periphery. Facial movements are symmetric. Motor: Mild increased tone in her lower legs Sensory: Withdrawal x 4 Coordination: Unable to adequately assess due to patient's inability to participate in examination. No dysmetria when reaching for objects. Gait  and Station: Unable to independently stand and bear weight. Reflexes: Difficult to assess due to her inability to cooperate and excessive adipose tissue. Toes neutral. No clonus.  Impression: 1.  Lennox Gastaut encephalopathy 2.  Generalized non-convulsive and convulsive epilepsy 3.  Increased tone in her legs 4.  Significant developmental and intellectual delays  Recommendations for plan of care: The patient's previus [facility] records were reviewed. [name] has neither had nor required imaging or lab studies since the last visit. She is a 33 year old woman with Lennox Gastaut encephalopathy, epilepsy, and significant developmental and intellectual delays. She is taking and tolerating brand Depakote, Keppra, Topamax and Banzel and has not experienced convulsive seizures since her last visit. She has a large amount of edema in her feet  and I encouraged Mom to talk with her PCP about that. I also talked with Mom about limiting salty snacks and portions as [name] is very obese. I will see Salaya back in follow up in 6 months or sooner if needed. Mom agreed with the plans made today.  The medication list was reviewed and reconciled. No changes were made in the prescribed medications today. A complete medication list was provided to the patient's mother.  Allergies as of 06/24/2018      Reactions   Carbamazepine Rash      Medication List       Accurate as of June 24, 2018 11:59 PM. Always use your most recent med list.        acetaminophen 160 MG/5ML suspension Commonly known as:  TYLENOL Take 320 mg by mouth every 6 (six) hours as needed for mild pain.   acetaminophen 650 MG suppository Commonly known as:  TYLENOL Place 650 mg rectally every 8 (eight) hours as needed for mild pain.   BANZEL 40 MG/ML Susp Generic drug:  Rufinamide Take 5ml in the morning and 5ml at night   CALCARB 600/D 600-400 MG-UNIT tablet Generic drug:  Calcium Carbonate-Vitamin D Take 1 tablet by mouth daily. Only gives once a week.   cetirizine HCl 1 MG/ML solution Commonly known as:  ZYRTEC Take 10 mLs (10 mg total) by mouth daily for 7 days.   clotrimazole 1 % cream Commonly known as:  LOTRIMIN Apply 1 application topically 2 (two) times daily.   clotrimazole 1 % vaginal cream Commonly known as:  GYNE-LOTRIMIN Place 1 Applicatorful vaginally at bedtime.   DEPAKOTE SPRINKLES 125 MG capsule Generic drug:  divalproex TAKE 8 CAPSULES BY MOUTH TWICE DAILY   DIASTAT ACUDIAL 20 MG Gel Generic drug:  diazepam DIAL TO 15 MG, GIVE RECTALLY FOR SEIZURES LASTING 2 MINUTES OR LONGER   feeding supplement (ENSURE ENLIVE) Liqd Take 237 mLs by mouth 3 (three) times daily between meals.   fluticasone 50 MCG/ACT nasal spray Commonly known as:  FLONASE SHAKE LIQUID AND USE 2 SPRAYS IN EACH NOSTRIL DAILY   furosemide 20 MG tablet Commonly  known as:  LASIX TAKE 1 TABLET BY MOUTH DAILY AS NEEDED FOR FLUID RETENTION   guaiFENesin 100 MG/5ML liquid Commonly known as:  ROBITUSSIN Take 200 mg by mouth as needed for cough or congestion.   hydrocortisone cream 0.5 % Apply 1 application topically 2 (two) times daily.   ibuprofen 200 MG tablet Commonly known as:  ADVIL,MOTRIN Take 400 mg by mouth as needed for moderate pain.   KEPPRA 100 MG/ML solution Generic drug:  levETIRAcetam Take 12ml by mouth every morning and 12ml by mouth at night   lactulose 10 GM/15ML solution Commonly  known as:  CHRONULAC TAKE 30 MLS BY MOUTH TWICE DAILY   levOCARNitine 1 GM/10ML solution Commonly known as:  CARNITOR TAKE 5 MLS BY MOUTH THREE TIMES DAILY   liver oil-zinc oxide 40 % ointment Commonly known as:  DESITIN Apply 1 application topically daily.   loperamide 2 MG capsule Commonly known as:  IMODIUM Take 1 capsule (2 mg total) by mouth as needed for diarrhea or loose stools.   nystatin powder Commonly known as:  nystatin APPLY TO AFFECTED DAIPER AREAS WITH EVERY DIAPER CHANGE UNTIL RASH CLEARS   ondansetron 4 MG/5ML solution Commonly known as:  ZOFRAN TAKE 5 MLS FOR PAIN EVERY 8 HOURS AS NEEDED FOR NAUSEA OR VOMITING   pantoprazole sodium 40 mg/20 mL Pack Commonly known as:  PROTONIX MIX AND GIVE 1 PACKET DAILY AS DIRECTED   TOPAMAX SPRINKLE 25 MG capsule Generic drug:  topiramate TAKE 6 CAPSULES BY MOUTH IN THE MORNING AND 6 CAPSULES BY MOUTH IN THE EVENING WITH FOOD   trimethoprim-polymyxin b ophthalmic solution Commonly known as:  POLYTRIM Place 1 drop into both eyes every 4 (four) hours.      Total time spent with the patient was 20 minutes, of which 50% or more was spent in counseling and coordination of care.   Elveria Risingina Shakeia Krus NP-C [facility] Ph. [phone] Fax [phone]

## 2018-06-24 NOTE — Patient Instructions (Signed)
Thank you for coming in today.   Instructions for you until your next appointment are as follows: 1. Continue giving Tatia's medications as you have been giving them. Let me know if she has any convulsive seizures. 2. I am concerned about the swelling in her hands, legs and feet. Try to work on reducing amount of salt/sodium in her diet as well reducing portion sizes and snacks. I agree that it would be good for Cassie to be evaluated by a dietician to help her to lose some weight and hopefully improve the swelling.  3.Please plan to return for follow up in 6 months or sooner if needed.

## 2018-06-27 ENCOUNTER — Encounter (INDEPENDENT_AMBULATORY_CARE_PROVIDER_SITE_OTHER): Payer: Self-pay | Admitting: Family

## 2018-07-04 ENCOUNTER — Ambulatory Visit (INDEPENDENT_AMBULATORY_CARE_PROVIDER_SITE_OTHER): Payer: Medicare Other | Admitting: Student in an Organized Health Care Education/Training Program

## 2018-07-04 ENCOUNTER — Other Ambulatory Visit: Payer: Self-pay

## 2018-07-04 ENCOUNTER — Encounter: Payer: Self-pay | Admitting: Student in an Organized Health Care Education/Training Program

## 2018-07-04 VITALS — BP 114/76 | HR 80 | Temp 97.9°F | Ht 59.0 in | Wt 217.0 lb

## 2018-07-04 DIAGNOSIS — R600 Localized edema: Secondary | ICD-10-CM | POA: Diagnosis not present

## 2018-07-04 DIAGNOSIS — R609 Edema, unspecified: Secondary | ICD-10-CM | POA: Diagnosis not present

## 2018-07-04 NOTE — Patient Instructions (Signed)
It was a pleasure seeing you today in our clinic.  Here is the treatment plan we have discussed and agreed upon together:  A consult was placed to Lymphedema clinic at today's visit.  You will receive a call to schedule an appointment. If you do not receive a call within two weeks please call our office so we can place the consult again.  We drew blood work at today's visit. I will call or send you a letter with these results. If you do not hear from me within the next week, please give our office a call.  Our clinic's number is [phone]. Please call with questions or concerns about what we discussed today.  Be well, Dr. Mosetta Putt

## 2018-07-05 LAB — BASIC METABOLIC PANEL
BUN/Creatinine Ratio: 23 (ref 9–23)
BUN: 21 mg/dL — ABNORMAL HIGH (ref 6–20)
CHLORIDE: 103 mmol/L (ref 96–106)
CO2: 22 mmol/L (ref 20–29)
Calcium: 9.5 mg/dL (ref 8.7–10.2)
Creatinine, Ser: 0.92 mg/dL (ref 0.57–1.00)
GFR calc Af Amer: 95 mL/min/{1.73_m2} (ref >59)
GFR, EST NON AFRICAN AMERICAN: 83 mL/min/{1.73_m2} (ref >59)
Glucose: 98 mg/dL (ref 65–99)
POTASSIUM: 4.4 mmol/L (ref 3.5–5.2)
SODIUM: 142 mmol/L (ref 134–144)

## 2018-07-05 NOTE — Progress Notes (Signed)
   CC: LE edema  HPI: Michele [name] is a 33 y.o. female with PMH: Patient Active Problem List   Diagnosis Date Noted  . Lennox-Gastaut syndrome (HCC) 07/06/2017  . Lymphedema 01/22/2017  . Bilateral lower extremity edema 04/21/2016  . Unsteadiness on feet   . Amenorrhea 05/19/2014  . 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 Morbid obesity (HCC) 12/24/2012  . 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 URINARY INCONTINENCE 02/25/2010  . Full incontinence of feces 02/25/2010  . Hypothyroidism 01/29/2009  . 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 Cerebral palsy (HCC) 01/29/2009  . GASTROESOPHAGEAL REFLUX DISEASE 01/29/2009  . Osteoporosis 01/29/2009  . Seizure disorder (HCC) 01/29/2009   History is provided by patient's mother who is the primary care-taker. Patient is non-verbal. Patient has a history of LE edema which is chronic. She usually uses compression stockings and leg elevation. She uses lasix PRN leg swelling which is effective. She has not had any redness, sores, drainage. No recent fevers. She has been acting normally/at her baseline.    Review of Symptoms:  See HPI for ROS.   CC, SH/smoking status, and VS noted.  Objective: BP 114/76   Pulse 80   Temp 97.9 F (36.6 C) (Oral)   Ht 4\' 11"  (1.499 m)   Wt 217 lb (98.4 kg)   LMP 06/07/2018 (Approximate)   SpO2 97%   BMI 43.83 kg/m  GEN: Severely intellectually disabled, developmentally delayed nonverbal female sits in wheelchair RESPIRATORY: Comfortable work of breathing, CTA throughout, poor effort CV: Regular rate noted, distal extremities well perfused. 0-1+ edema GI: Soft, nondistended SKIN: warm and dry, no rashes or lesions   Assessment and plan:  Bilateral lower extremity edema Chronic. Mild. Most likely dependent edema. Mom gives lasix PRN, about every other day or 2-3 times per week. Echo in 2018 without evidence of CHF. Patient has had success with  [place] lymphedema clinic in the past, mom requests referral back to this - amb referral to lymphedema clinic - ok to continue lasix PRN LE edema - compression stockings as tolerated - leg elevation as tolerated - check BMP for K and Cr today   Orders Placed This Encounter  Procedures  . Basic metabolic panel  . Ambulatory referral to Physical Therapy    Referral Priority:   Routine    Referral Type:   Physical Medicine    Referral Reason:   Specialty Services Required    Requested Specialty:   Rehabilitation    Number of Visits Requested:   1    No orders of the defined types were placed in this encounter.    Howard Pouch, MD,MS,  PGY3 07/05/2018 10:39 AM

## 2018-07-05 NOTE — Assessment & Plan Note (Signed)
Chronic. Mild. Most likely dependent edema. Mom gives lasix PRN, about every other day or 2-3 times per week. Echo in 2018 without evidence of CHF. Patient has had success with [place] lymphedema clinic in the past, mom requests referral back to this - amb referral to lymphedema clinic - ok to continue lasix PRN LE edema - compression stockings as tolerated - leg elevation as tolerated - check BMP for K and Cr today

## 2018-07-22 ENCOUNTER — Telehealth (INDEPENDENT_AMBULATORY_CARE_PROVIDER_SITE_OTHER): Payer: Self-pay | Admitting: Family

## 2018-07-22 DIAGNOSIS — G40319 Generalized idiopathic epilepsy and epileptic syndromes, intractable, without status epilepticus: Secondary | ICD-10-CM

## 2018-07-22 MED ORDER — KEPPRA 100 MG/ML PO SOLN: ORAL | 5 refills | Status: DC

## 2018-07-22 NOTE — Telephone Encounter (Signed)
RX has been faxed to the pharmacy.

## 2018-07-22 NOTE — Telephone Encounter (Signed)
°  Who's calling (name and relationship to patient) : [name] - Mother   Best contact number: [phone]  Provider they see: Elveria Risingina Goodpasture   Reason for call:  Mom called in stating Fredrika's Keppra has to be refilled.    PRESCRIPTION REFILL ONLY  Name of prescription: Keppra   [facility] [address] [place]

## 2018-07-23 ENCOUNTER — Emergency Department (HOSPITAL_COMMUNITY): Payer: Medicare Other

## 2018-07-23 ENCOUNTER — Emergency Department (HOSPITAL_COMMUNITY)
Admission: EM | Admit: 2018-07-23 | Discharge: 2018-07-23 | Disposition: A | Discharge status: Home / Self Care | Payer: Medicare Other | Attending: Emergency Medicine | Admitting: Emergency Medicine

## 2018-07-23 DIAGNOSIS — Z79899 Other long term (current) drug therapy: Secondary | ICD-10-CM | POA: Insufficient documentation

## 2018-07-23 DIAGNOSIS — E039 Hypothyroidism, unspecified: Secondary | ICD-10-CM | POA: Insufficient documentation

## 2018-07-23 DIAGNOSIS — R0602 Shortness of breath: Secondary | ICD-10-CM | POA: Diagnosis not present

## 2018-07-23 DIAGNOSIS — R0902 Hypoxemia: Secondary | ICD-10-CM | POA: Diagnosis not present

## 2018-07-23 DIAGNOSIS — J101 Influenza due to other identified influenza virus with other respiratory manifestations: Secondary | ICD-10-CM

## 2018-07-23 DIAGNOSIS — G809 Cerebral palsy, unspecified: Secondary | ICD-10-CM | POA: Insufficient documentation

## 2018-07-23 DIAGNOSIS — R9431 Abnormal electrocardiogram [ECG] [EKG]: Secondary | ICD-10-CM | POA: Diagnosis not present

## 2018-07-23 DIAGNOSIS — R079 Chest pain, unspecified: Secondary | ICD-10-CM | POA: Diagnosis not present

## 2018-07-23 DIAGNOSIS — R000 Tachycardia, unspecified: Secondary | ICD-10-CM | POA: Diagnosis not present

## 2018-07-23 DIAGNOSIS — J111 Influenza due to unidentified influenza virus with other respiratory manifestations: Secondary | ICD-10-CM | POA: Insufficient documentation

## 2018-07-23 DIAGNOSIS — R5383 Other fatigue: Secondary | ICD-10-CM | POA: Diagnosis present

## 2018-07-23 DIAGNOSIS — R0789 Other chest pain: Secondary | ICD-10-CM | POA: Diagnosis not present

## 2018-07-23 DIAGNOSIS — R404 Transient alteration of awareness: Secondary | ICD-10-CM | POA: Diagnosis not present

## 2018-07-23 DIAGNOSIS — J9811 Atelectasis: Secondary | ICD-10-CM | POA: Diagnosis not present

## 2018-07-23 LAB — CBC WITH DIFFERENTIAL/PLATELET
Abs Immature Granulocytes: 0.17 10*3/uL — ABNORMAL HIGH (ref 0.00–0.07)
Basophils Absolute: 0 10*3/uL (ref 0.0–0.1)
Basophils Relative: 0 %
Eosinophils Absolute: 0 10*3/uL (ref 0.0–0.5)
Eosinophils Relative: 0 %
HCT: 42.2 % (ref 36.0–46.0)
Hemoglobin: 12.5 g/dL (ref 12.0–15.0)
Immature Granulocytes: 2 %
Lymphocytes Relative: 8 %
Lymphs Abs: 0.8 10*3/uL (ref 0.7–4.0)
MCH: 30.5 pg (ref 26.0–34.0)
MCHC: 29.6 g/dL — ABNORMAL LOW (ref 30.0–36.0)
MCV: 102.9 fL — ABNORMAL HIGH (ref 80.0–100.0)
Monocytes Absolute: 1.3 10*3/uL — ABNORMAL HIGH (ref 0.1–1.0)
Monocytes Relative: 13 %
NRBC: 0.5 % — AB (ref 0.0–0.2)
Neutro Abs: 7.8 10*3/uL — ABNORMAL HIGH (ref 1.7–7.7)
Neutrophils Relative %: 77 %
Platelets: 233 10*3/uL (ref 150–400)
RBC: 4.1 MIL/uL (ref 3.87–5.11)
RDW: 15.8 % — ABNORMAL HIGH (ref 11.5–15.5)
WBC: 10.1 10*3/uL (ref 4.0–10.5)

## 2018-07-23 LAB — URINALYSIS, ROUTINE W REFLEX MICROSCOPIC
Bacteria, UA: NONE SEEN
Bilirubin Urine: NEGATIVE
Glucose, UA: NEGATIVE mg/dL
Hgb urine dipstick: NEGATIVE
Ketones, ur: 20 mg/dL — AB
Leukocytes, UA: NEGATIVE
Nitrite: NEGATIVE
Protein, ur: 30 mg/dL — AB
Specific Gravity, Urine: 1.023 (ref 1.005–1.030)
pH: 8 (ref 5.0–8.0)

## 2018-07-23 LAB — INFLUENZA PANEL BY PCR (TYPE A & B)
INFLBPCR: NEGATIVE
Influenza A By PCR: POSITIVE — AB

## 2018-07-23 LAB — COMPREHENSIVE METABOLIC PANEL
ALT: 22 U/L (ref 0–44)
AST: 24 U/L (ref 15–41)
Albumin: 3.1 g/dL — ABNORMAL LOW (ref 3.5–5.0)
Alkaline Phosphatase: 62 U/L (ref 38–126)
Anion gap: 8 (ref 5–15)
BUN: 15 mg/dL (ref 6–20)
CO2: 25 mmol/L (ref 22–32)
Calcium: 9.2 mg/dL (ref 8.9–10.3)
Chloride: 106 mmol/L (ref 98–111)
Creatinine, Ser: 0.84 mg/dL (ref 0.44–1.00)
GFR calc Af Amer: >60 mL/min (ref >60)
GFR calc non Af Amer: >60 mL/min (ref >60)
Glucose, Bld: 97 mg/dL (ref 70–99)
Potassium: 3.8 mmol/L (ref 3.5–5.1)
Sodium: 139 mmol/L (ref 135–145)
Total Bilirubin: 0.3 mg/dL (ref 0.3–1.2)
Total Protein: 7.3 g/dL (ref 6.5–8.1)

## 2018-07-23 LAB — PREGNANCY, URINE: Preg Test, Ur: NEGATIVE

## 2018-07-23 LAB — LACTIC ACID, PLASMA: Lactic Acid, Venous: 1.5 mmol/L (ref 0.5–1.9)

## 2018-07-23 MED ORDER — ONDANSETRON 4 MG PO TBDP: 4.0000 mg | ORAL_TABLET | Freq: Four times a day (QID) | ORAL | 0 refills | Status: DC | PRN

## 2018-07-23 MED ORDER — SODIUM CHLORIDE 0.9 % IV BOLUS (SEPSIS)
1000.0000 mL | Freq: Once | INTRAVENOUS | Status: AC
  Administered 2018-07-23: 1000 mL via INTRAVENOUS

## 2018-07-23 MED ORDER — ACETAMINOPHEN 160 MG/5ML PO SOLN
1000.0000 mg | Freq: Once | ORAL | Status: AC
  Administered 2018-07-23: 1000 mg via ORAL
  Filled 2018-07-23: qty 40.6

## 2018-07-23 MED ORDER — OSELTAMIVIR PHOSPHATE 75 MG PO CAPS
75.0000 mg | ORAL_CAPSULE | Freq: Once | ORAL | Status: AC
  Administered 2018-07-23: 75 mg via ORAL
  Filled 2018-07-23: qty 1

## 2018-07-23 MED ORDER — OSELTAMIVIR PHOSPHATE 75 MG PO CAPS: 75.0000 mg | ORAL_CAPSULE | Freq: Two times a day (BID) | ORAL | 0 refills | Status: DC

## 2018-07-23 MED ORDER — ACETAMINOPHEN 500 MG PO TABS
1000.0000 mg | ORAL_TABLET | Freq: Once | ORAL | Status: DC
  Filled 2018-07-23: qty 2

## 2018-07-23 MED ORDER — ONDANSETRON 4 MG PO TBDP
4.0000 mg | ORAL_TABLET | Freq: Once | ORAL | Status: AC
  Administered 2018-07-23: 4 mg via ORAL
  Filled 2018-07-23: qty 1

## 2018-07-23 NOTE — ED Triage Notes (Signed)
Per EMS, from home w/ Mom.  Pt has CP and seizures, normally non-verbal.  Per Mom report she has "been less responsive but more tired."  Mom stats she has not wanted to sleep in the bed rather sleep sitting up.  No N/V.  originally 96% on RA, rhonchi lung sounds given 1 duo neb.  Per EMS Pt is a lot more alert.  CBG 82, 99.5 temp, 116/74, 110bpm.

## 2018-07-23 NOTE — ED Notes (Signed)
Pt discharged from ED; instructions provided and scripts given; Pt encouraged to return to ED if symptoms worsen and to f/u with PCP; Pt verbalized understanding of all instructions 

## 2018-07-23 NOTE — ED Provider Notes (Signed)
TIME SEEN: 2:06 AM  CHIEF COMPLAINT: Decreased responsiveness  HPI: Patient is a 33 year old female with history of cerebral palsy, Lennox-Gastaut syndrome, mental retardation who is nonverbal at baseline who presents to the emergency department with EMS for lethargy, malaise, cough today.  Oral temperature of 99.5 with EMS.  Did have rhonchorous breath sounds and nasal congestion.  Given DuoNeb in route.  No hypoxia.  States that she seems to be more alert per EMS.  Blood glucose 82.  No recent vomiting per family.  Is on lactulose chronically.  Wheelchair-bound at baseline.  ROS: Level 5 caveat as patient is nonverbal  PAST MEDICAL HISTORY/PAST SURGICAL HISTORY:  Past Medical History:  Diagnosis Date  . Cerebral palsy (HCC)   . Hypothyroidism   . Lennox-Gastaut syndrome (HCC)   . Osteoporosis   . Pneumonia   . Profound mental retardation   . Reflux   . Rickets   . Seizures (HCC)     MEDICATIONS:  Prior to Admission medications   Medication Sig Start Date End Date Taking? Authorizing Provider  acetaminophen (TYLENOL) 160 MG/5ML suspension Take 320 mg by mouth every 6 (six) hours as needed for mild pain.     [provider]  acetaminophen (TYLENOL) 650 MG suppository Place 650 mg rectally every 8 (eight) hours as needed for mild pain.    [provider]  Mariane Duval 40 MG/ML SUSP Take 5ml in the morning and 5ml at night 06/17/18   Elveria Rising, NP  Calcium Carbonate-Vitamin D (CALCARB 600/D) 600-400 MG-UNIT tablet Take 1 tablet by mouth daily. Only gives once a week.    [provider]  cetirizine HCl (ZYRTEC) 1 MG/ML solution Take 10 mLs (10 mg total) by mouth daily for 7 days. 05/25/18 06/01/18  Alene Mires, NP  clotrimazole (GYNE-LOTRIMIN) 1 % vaginal cream Place 1 Applicatorful vaginally at bedtime. 11/12/17   Freddrick March, MD  clotrimazole (LOTRIMIN) 1 % cream Apply 1 application topically 2 (two) times daily. 02/20/18   Freddrick March, MD   DEPAKOTE SPRINKLES 125 MG capsule TAKE 8 CAPSULES BY MOUTH TWICE DAILY 06/17/18   Elveria Rising, NP  DIASTAT ACUDIAL 20 MG GEL DIAL TO 15 MG, GIVE RECTALLY FOR SEIZURES LASTING 2 MINUTES OR LONGER 01/25/18   Elveria Rising, NP  feeding supplement, ENSURE ENLIVE, (ENSURE ENLIVE) LIQD Take 237 mLs by mouth 3 (three) times daily between meals. 04/11/16   Beaulah Dinning, MD  fluticasone (FLONASE) 50 MCG/ACT nasal spray SHAKE LIQUID AND USE 2 SPRAYS IN [facility] NOSTRIL DAILY 05/04/17   Leland Her, DO  furosemide (LASIX) 20 MG tablet TAKE 1 TABLET BY MOUTH DAILY AS NEEDED FOR FLUID RETENTION 03/21/18   Howard Pouch, MD  guaiFENesin (ROBITUSSIN) 100 MG/5ML liquid Take 200 mg by mouth as needed for cough or congestion.    [provider]  hydrocortisone cream 0.5 % Apply 1 application topically 2 (two) times daily. 10/06/15   Raliegh Ip, DO  ibuprofen (ADVIL,MOTRIN) 200 MG tablet Take 400 mg by mouth as needed for moderate pain.    [provider]  KEPPRA 100 MG/ML solution Take 12ml by mouth every morning and 12ml by mouth at night 07/22/18   Elveria Rising, NP  lactulose (CHRONULAC) 10 GM/15ML solution TAKE 30 MLS BY MOUTH TWICE DAILY 02/26/18   Howard Pouch, MD  levOCARNitine (CARNITOR) 1 GM/10ML solution TAKE 5 MLS BY MOUTH THREE TIMES DAILY 06/17/18   Elveria Rising, NP  liver oil-zinc oxide (DESITIN) 40 % ointment Apply 1  application topically daily.    [provider]  loperamide (IMODIUM) 2 MG capsule Take 1 capsule (2 mg total) by mouth as needed for diarrhea or loose stools. 11/12/17   Freddrick March, MD  nystatin (NYSTATIN) powder APPLY TO AFFECTED DAIPER AREAS WITH EVERY DIAPER CHANGE UNTIL RASH CLEARS 03/25/18   Howard Pouch, MD  ondansetron [facility]) 4 MG/5ML solution TAKE 5 MLS FOR PAIN EVERY 8 HOURS AS NEEDED FOR NAUSEA OR VOMITING 06/29/14   Hess, Judie Grieve R, DO  pantoprazole sodium (PROTONIX) 40 mg/20 mL PACK MIX AND GIVE 1 PACKET DAILY AS DIRECTED  12/27/17   Howard Pouch, MD  TOPAMAX SPRINKLE 25 MG capsule TAKE 6 CAPSULES BY MOUTH IN THE MORNING AND 6 CAPSULES BY MOUTH IN THE EVENING WITH FOOD 06/17/18   Elveria Rising, NP  trimethoprim-polymyxin b (POLYTRIM) ophthalmic solution Place 1 drop into both eyes every 4 (four) hours. 05/25/18   Alene Mires, NP    ALLERGIES:  Allergies  Allergen Reactions  . Carbamazepine Rash    SOCIAL HISTORY:  Social History   Tobacco Use  . Smoking status: Never Smoker  . Smokeless tobacco: Never Used  Substance Use Topics  . Alcohol use: No    FAMILY HISTORY: Family History  Problem Relation Age of Onset  . Hypertension Mother   . Heart disease Maternal Grandmother        Died at 74  . Stroke Maternal Grandfather        died at 103  . Hypertension Paternal Grandmother   . Diabetes Other        MGGM  . Heart disease Other        MGGF  . Kidney disease Other        Maternal Great Aunt   . Cancer Maternal Aunt        lung  . Colon cancer Neg Hx     EXAM: There were no vitals taken for this visit. CONSTITUTIONAL:  Chronically ill-appearing, no distress, nonverbal at baseline, alert HEAD: Normocephalic, atraumatic EYES: Conjunctivae clear, pupils appear equal, EOMI ENT: normal nose; moist mucous membranes, clear nasal congestion noted from both nostrils NECK: Supple, no meningismus, no nuchal rigidity, no LAD  CARD: Regular and tachycardic; S1 and S2 appreciated; no murmurs, no clicks, no rubs, no gallops RESP: Normal chest excursion without splinting or tachypnea; breath sounds clear and equal bilaterally; no wheezes, no rhonchi, no rales, no hypoxia or respiratory distress, speaking full sentences ABD/GI: Normal bowel sounds; non-distended; soft, non-tender, no rebound, no guarding, no peritoneal signs, no hepatosplenomegaly BACK:  The back appears normal EXT:non-tender to palpation; no edema; normal capillary refill; no cyanosis SKIN: Normal color for age and race;  warm; no rash NEURO: Moves all extremities equally   MEDICAL DECISION MAKING: Patient here with low-grade fever, nasal congestion, rhonchorous breath sounds, decreased responsiveness today.  Now at her neurologic baseline.  Differential includes UTI, pneumonia, influenza.  Will check labs, urine, chest x-ray, flu swab.  Will give IV fluids.  ED PROGRESS: Mother at bedside.  She reports patient has been around family member with influenza.  She states patient has had nasal congestion, cough.  No known fever.  Patient has had a flu shot this year.  Mother reports that she has been sleeping more and did not want to get off the couch and get into her bed tonight.  She also had a hard time getting patient up to transfer into her wheelchair.  States this is why she had to  call 911.  She was concerned patient could have the flu or pneumonia.  Patient's labs, urine reassuring.  X-ray shows no pneumonia.  She is flu a positive.  Given patient's history of chronic medical conditions, will start her on Tamiflu.  Discussed with mother that this can cause nausea and vomiting.  Will discharge with nausea medicine as well.  Vital signs have improved as fever has come down.  I feel she is safe to be discharged.  Mother comfortable with this plan.   EKG Interpretation  Date/Time:  Tuesday July 23 2018 02:15:31 EST Ventricular Rate:  127 PR Interval:  150 QRS Duration: 80 QT Interval:  300 QTC Calculation: 436 R Axis:   73 Text Interpretation:  Sinus tachycardia Cannot rule out Inferior infarct , age undetermined Abnormal ECG Confirmed by Ward, Baxter HireKristen [id number]) on 07/23/2018 2:25:35 AM          Ward, Layla MawKristen N, DO 07/23/18 [phone]

## 2018-07-23 NOTE — Discharge Instructions (Signed)
You may alternate Tylenol 1000 mg every 6 hours as needed for pain and Ibuprofen 800 mg every 8 hours as needed for pain.  Please take Ibuprofen with food. ° °

## 2018-07-23 NOTE — ED Notes (Signed)
Patient transported to X-ray 

## 2018-08-07 ENCOUNTER — Ambulatory Visit: Payer: Medicare Other | Attending: Family Medicine | Admitting: Occupational Therapy

## 2018-08-07 ENCOUNTER — Other Ambulatory Visit: Payer: Self-pay

## 2018-08-07 ENCOUNTER — Encounter: Payer: Self-pay | Admitting: Occupational Therapy

## 2018-08-07 DIAGNOSIS — I890 Lymphedema, not elsewhere classified: Secondary | ICD-10-CM

## 2018-08-07 NOTE — Patient Instructions (Signed)

## 2018-08-14 ENCOUNTER — Encounter: Payer: Self-pay | Admitting: Occupational Therapy

## 2018-08-15 NOTE — Therapy (Signed)
[place] MAIN [facility] SERVICES [address] [place], [place], [address] Phone: [phone]   Fax:  [phone]  Occupational Therapy Evaluation  Patient Details  Name: Michele [name] MRN: [id number] Date of Birth: Apr 09, 1986 No data recorded  Encounter Date: 08/07/2018  OT End of Session - 08/15/18 0904    Visit Number  1    Number of Visits  36    Date for OT Re-Evaluation  11/05/18    Activity Tolerance  Patient tolerated treatment well;No increased pain    Behavior During Therapy  WFL for tasks assessed/performed       Past Medical History:  Diagnosis Date  . Cerebral palsy ([place])   . Hypothyroidism   . Lennox-Gastaut syndrome ([place])   . Osteoporosis   . Pneumonia   . Profound mental retardation   . Reflux   . Rickets   . Seizures ([place])     Past Surgical History:  Procedure Laterality Date  . FOOT MASS EXCISION Right   . Gastrocutaneous fistual repair  2012  . PEG TUBE PLACEMENT    . PEG TUBE REMOVAL  2012 ?    There were no vitals filed for this visit.  Subjective Assessment - 08/15/18 0300    Subjective   Stacie Templin returns to Occupational Therapy today for evaluation and treatment of BLE lymphedema. She is referred by Kinnie Feil, MD. She is accompanied by her mother who is her primary caregiver. Michele [name] is well known to this OT. She successfully completed intensive phase Complete Decvongestive Therapy with management phase follow-aong support in 2019 with all goals met.  Michele [name]'s mother reports that her custom compression stockins are worn out and need to be replaced.     Patient is accompained by:  Family member    Pertinent History  moderate, stage II, BLE lymphedema-onset unsure, CP, Hypothyroidism, Lennox-Gastaut Syndrome, hx pneumonia, Siezure disorder, reflux, osteoporosis, falls hx    Limitations  chronic, progressive, stage II BLE lymphedema and associated leg pain, difficulty walking, impaired  balance, impaired  cognitive impairment,    Repetition  Increases Symptoms    Special Tests  + Stemmer sign base of toes bilaterally        [facility] OT Assessment - 08/15/18 0001      Assessment   Medical Diagnosis  Mild-moderate, BLE, stage II, lymphedema- - onset unsure    Prior Therapy  Intensive and management phase CDT w/ manual lymphatic drainage, skin care, ther ex and compression therapy. CG edu for all self-care protocols for optimal LE management; fitted with ccl 3, (35-45 mmHg) flat knit, Elvarex     , knee length compression garments and HOS device      Precautions   Precautions  Fall   LE Precautions: HYPOTHYROID, LE skin precautions     Restrictions   Weight Bearing Restrictions  No      Balance Screen   Has the patient fallen in the past 6 months  No    Has the patient had a decrease in activity level because of a fear of falling?   No    Is the patient reluctant to leave their home because of a fear of falling?   No      Home  Environment   Family/patient expects to be discharged to:  Private residence    Living Arrangements  Parent    Available Help at Discharge  Family    Type of [place] Access  Ramped entrance    Home Layout  One level    Bathroom Licensed conveyancer - manual    Lives With  Family      Prior Function   Level of Independence  Needs assistance with transfers;Needs assistance with gait;Needs assistance with ADLs;Requires assistive device for independence    Vocation  On disability;Other (comment)   day program     ADL   Eating/Feeding  Maximal assistance    Grooming  Maximal assistance    Upper Body Bathing  Maximal assistance    Lower Body Bathing  Maximal assistance    Upper Body Dressing  Maximal assistance       LYMPHEDEMA/ONCOLOGY QUESTIONNAIRE - 08/15/18 0827      Right Lower Extremity Lymphedema   Other  RLE ankle-to popliteal (A-D) limb volume measures 3786.57 ml.     Other  RLE A-D limb volume is increased by 12.10% since last measured on 11/19/2017    Other  Limb volume differential (LVD) measures 0.16%, l>r. tHIS VALUE REVEALS essentially symmetrical limb volumes below the knees at pr4esent. Pt is R dominant and LLE has historically been greater in LVD than RLE.      Left Lower Extremity Lymphedema   Other  LLE ankle-to popliteal (A-D) limb volume measures 3792.44 ml.    Other  LLE is increased in volume by 7.24% since last measured on 11/19/17.         Mild-ModerateStage II,  LLE lymphedema  2/2  unknown etiology, suspected Primary LE Skin  Description Hyper-Keratosis Peau' de Orange Shiny Tight Fibrotic Fatty Doughy Indurated   absent   x Moderate x     Hydration Dry Flaky Erythema Macerated   mild mild     Color Redness Present Pallor Blanching Hemosiderin Staining Other   mild        Odor Malodorous Yeast Fungal infection  Absent      x   Temperature Warm Cool wnl   x     Pitting Edema   1+ 2+ 3+ 4+ Non-pitting        x   Girth Symmetrical Asymmetrical Other Distribution    L>R     Stemmer Sign Positive Negative     +bilaterally, L>R    Lymphorrea History Of:  Present Absent      x   Wounds History Of Present Absent Venous Arterial Pressure Size   denies            Signs of Infection Redness Warmth Erythema Acute Swelling Drainage Borders                   Scars Adhesions Hypersensitivity        Sensation Light Touch Deep pressure Hypersensitivty   Present Impaired Present Impaired Absent Impaired   In tact  In tact                 OT Education - 08/15/18 0904    Education Details  Provided Pt and family skilled education regarding lymphatic structure and function, etiologies, onset patterns and stages of progression. Discussed  impact of obesity on lymphatic system function. Outlined Complete Decongestive Therapy (CDT)  as standard of care and provided in depth information regarding 4 primary  components of both Intensive and Self Management Phases, including Manual Lymph Drainage (MLD), compression wrapping and garments, skin care, and therapeutic exercise.   Pilar Plate discussion of high burden  of care and in this case, need for consistent , daily caregiver assistance for optimal clinical outcome. Discussed  Importance of daily, ongoing LE self-care essential to retaining clinical gains and limiting progression.  Lastly, reviewed lymphedema precautions, including cellulitis risk and difficulty with wound healing. Provided printed Lymphedema Workbook for reference.    Person(s) Educated  Patient;Parent(s)    Methods  Explanation;Demonstration;Handout    Comprehension  Verbalized understanding;Returned demonstration;Need further instruction          OT Long Term Goals - 08/15/18 0904      OT LONG TERM GOAL #1   Title  Pt to follow Lymphedema precautions and prevention strategies with maximum caregiver assistance to limit LE progression and further functional decline. (Primary caregiver modified independent w/ lymphedema precautions and prevention principals and strategies using printed reference to limit LE progression and infection risk.)    Baseline  Dependent    Time  2    Period  Weeks    Status  New    Target Date  --   3rd OT Rx visit     OT LONG TERM GOAL #2   Title  Lymphedema (LE) management/ self-care: Pt able to apply knee length, multi layered, gradient compression wraps, one leg at a time, with max caregiver assist x 1 using proper techniques within 2 weeks to achieve optimal limb volume reduction in preparation for fitting compression garments/ devices.    Baseline  Dependent      Time  1    Period  Weeks    Status  New    Target Date  --   3rd OTn Rx visit     OT LONG TERM GOAL #3   Title  Lymphedema (LE) management/ self-care:  Pt to achieve at least 10%  BLE limb volume reductions  during Intensive Phase CDT to limit LE progression, to reduce infection risk, and  to improve functional mobility and transfers essential for optimal ADLs performance.    Baseline  Dependent     Time  12    Period  Weeks    Status  New    Target Date  11/05/18      OT LONG TERM GOAL #4   Title  Lymphedema (LE) management/ self-care:  Pt to tolerate daily compression wraps, compression garments and/ or HOS devices in keeping w/ prescribed wear regimes within 1 week of issue date of each to progress and retain clinical and functional gains and to limit LE progression.    Baseline  dependent    Time  12    Period  Weeks    Status  New    Target Date  11/05/18      OT LONG TERM GOAL #5   Title  Lymphedema (LE) management/ self-care:  During Management Phase CDT Pt to sustain current limb volumes within 3%, and all other clinical gains achieved during OT treatment iwith maximum caregiver assistance  (to control limb swelling and associated pain/ discomfort, to  limit LE progression, to decrease infection risk and to limit further functional decline.    Baseline  dependent    Time  6    Period  Months    Status  New    Target Date  02/04/19            Plan - 08/15/18 1018    Clinical Impression Statement  Michele [name] is a 33 y o female presenting with mild-moderate, stage II, BLE lymphedema 2/2 unknown etiology, but suspected primary type.  Mother reports onset in childhood without known precipitating event. Family hx of leg swelling is unclear. Michele [name] is well known to this Warden/ranger. She successfully completed intensive and management phase Comple Decongestive Therapy course in 2019 with good progress towards all OT goals and excellent caregiver compliance with LE self-care hme program.  Jalei returns today with progression of lymphedema, and custom BLE compression garments are worn out and in need of replacement.  Limb volumes are increased  relatively symetrically by 29.95% on the right compared with initial evaluation last year on 07/30/17, and by 33.93% on  the LLE . Artifacts contributing to these symmetrical increases include weight gain and possible systemic fluid retension. Michele [name] will benefit from repeat course of CDT to include manual lymphatic drainage,  (MLD), skin care, ther ex, compression therapies and high emphasis on caregiver education and training. The short term goals of CDT are to decrease lymphatic congestion and tissue density in the limbs and fit with appropriate compression to limit progression and increased nifection risk. Without skilled OT for intensive and management phase CDT condition will worsen and Pt will experience further functional decline.    Occupational Profile and client history currently impacting functional performance  dependent for lymphedema care, non-verbal    Occupational performance deficits (Please refer to evaluation for details):  ADL's;IADL's;Work;Play;Leisure;Social Participation;Rest and Sleep;Education    Rehab Potential  Good    Current Impairments/barriers affecting progress:  impaired intellectual delay and quadraparesis    OT Frequency  2x / week    OT Treatment/Interventions  Self-care/ADL training;DME and/or AE instruction;Manual Therapy;Patient/family education;Manual lymph drainage;Compression bandaging;Therapeutic activities;Other (comment)   BLE skin care during MLD with low ph Eucerin lotion and [place], skin grade castor oil.   Clinical Decision Making  Multiple treatment options, significant modification of task necessary    Recommended Other Services  Replace worn out existing custom BLE knee length compression stockings- 2 pr-1 to wash and one to wear; replace existing worn out HOS device    Consulted and Agree with Plan of Care  Family member/caregiver       Patient will benefit from skilled therapeutic intervention in order to improve the following deficits and impairments:  Decreased knowledge of use of DME, Decreased skin integrity, Increased edema, Decreased knowledge of  precautions, Decreased safety awareness, Difficulty walking, Obesity, Decreased balance, Abnormal gait, Decreased activity tolerance, Decreased cognition, Decreased range of motion, Impaired flexibility, Pain, Decreased mobility, Decreased coordination  Visit Diagnosis: Lymphedema, not elsewhere classified - Plan: Ot plan of care cert/re-cert    Problem List Patient Active Problem List   Diagnosis Date Noted  . Lennox-Gastaut syndrome ([place]) 07/06/2017  . Lymphedema 01/22/2017  . Bilateral lower extremity edema 04/21/2016  . Unsteadiness on feet   . Amenorrhea 05/19/2014  . 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 Morbid obesity ([place]) 12/24/2012  . Congenital quadriplegia ([place]) 12/24/2012  . URINARY INCONTINENCE 02/25/2010  . Full incontinence of feces 02/25/2010  . Hypothyroidism 01/29/2009  . 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 Cerebral palsy ([place]) 01/29/2009  . GASTROESOPHAGEAL REFLUX DISEASE 01/29/2009  . Osteoporosis 01/29/2009  . Seizure disorder ([place]) 01/29/2009    Michele Spearman, MS, OTR/L, [facility] 08/15/18 10:25 AM  [place] MAIN [facility] SERVICES [address] [place], [place], [address] Phone: [phone]   Fax:  [phone]  Name: MERTICE UFFELMAN MRN: [id number] Date of Birth: [birth year]

## 2018-08-15 NOTE — Therapy (Deleted)
[place] [facility] MAIN [facility] SERVICES [address] [place], [place], [address] Phone: [phone]   Fax:  [phone]  Occupational Therapy Evaluation  Patient Details  Name: Michele [name] MRN: [id number] Date of Birth: 1985/12/11 No data recorded  Encounter Date: 08/07/2018    Past Medical History:  Diagnosis Date  . Cerebral palsy (HCC)   . Hypothyroidism   . Lennox-Gastaut syndrome (HCC)   . Osteoporosis   . Pneumonia   . Profound mental retardation   . Reflux   . Rickets   . Seizures (HCC)     Past Surgical History:  Procedure Laterality Date  . FOOT MASS EXCISION Right   . Gastrocutaneous fistual repair  2012  . PEG TUBE PLACEMENT    . PEG TUBE REMOVAL  2012 ?    There were no vitals filed for this visit.   Marland Kitchenot    LYMPHEDEMA/ONCOLOGY QUESTIONNAIRE - 08/15/18 0827      Right Lower Extremity Lymphedema   Other  RLE ankle-to popliteal (A-D) limb volume measures 3786.57 ml.    Other  RLE A-D limb volume is increased by 12.10% since last measured on 11/19/2017    Other  Limb volume differential (LVD) measures 0.16%, l>r. tHIS VALUE REVEALS essentially symmetrical limb volumes below the knees at pr4esent. Pt is R dominant and LLE has historically been greater in LVD than RLE.      Left Lower Extremity Lymphedema   Other  LLE ankle-to popliteal (A-D) limb volume measures 3792.44 ml.    Other  LLE is increased in volume by 7.24% since last measured on 11/19/17.                         OT Long Term Goals - 08/07/18 1000      OT LONG TERM GOAL #1   Title  Pt to follow Lymphedema precautions and prevention strategies with maximum caregiver assistance to limit LE progression and further functional decline. (Primary caregiver modified independent w/ lymphedema precautions and prevention principals and strategies using printed reference to limit LE progression and infection risk.)    Baseline  Dependent    Time  2     Period  Weeks    Status  New    Target Date  --   8th OT Rx visit     OT LONG TERM GOAL #2   Title  Lymphedema (LE) management/ self-care: Pt able to apply knee length, multi layered, gradient compression wraps, one leg at a time, with max caregiver assist x 1 using proper techniques within 2 weeks to achieve optimal limb volume reduction in preparation for fitting compression garments/ devices.    Baseline  Dependent      Time  2    Period  Weeks    Status  New    Target Date  --   8th OT Rx visit     OT LONG TERM GOAL #3   Title  Lymphedema (LE) management/ self-care:  Pt to achieve at least 10%  BLE limb volume reductions  during Intensive Phase CDT to limit LE progression, to reduce infection risk, and to improve functional mobility and transfers essential for optimal ADLs performance.    Baseline  Dependent     Time  12    Period  Weeks    Status  New    Target Date  11/05/18      OT LONG TERM GOAL #4   Title  Lymphedema (LE) management/ self-care:  Pt to tolerate daily compression wraps, compression garments and/ or HOS devices in keeping w/ prescribed wear regimes within 1 week of issue date of each to progress and retain clinical and functional gains and to limit LE progression.    Baseline  dependent    Time  12    Period  Weeks    Status  New    Target Date  11/05/18      OT LONG TERM GOAL #5   Title  Lymphedema (LE) management/ self-care:  During Management Phase CDT Pt to sustain current limb volumes within 3%, and all other clinical gains achieved during OT treatment iwith maximum caregiver assistance  (to control limb swelling and associated pain/ discomfort, to  limit LE progression, to decrease infection risk and to limit further functional decline.    Baseline  dependent    Time  6    Period  Months    Status  New    Target Date  02/03/19            Plan - 08/15/18 0825    Clinical Impression Statement  Michele [name] is a 33 y o female presenting with  mild-moderate, stage II, BLE lymphedema 2/2 unknown etiology, but suspected primary type.  Mother reports onset in childhood without known precipitating event. Family hx of leg swelling is unclear. Michele [name] is well known to this Acupuncturist. She successfully completed intensive and management phase Comple Decongestive Therapy course in 2019 with good progress towards all OT goals and excellent caregiver compliance with LE self-care hme program.  Michele [name] returns today with progression of lymphedema, and custom BLE compression garments are worn out and in need of replacement.  Limb volumes are increased  relatively symetrically by 29.95% on the right compared with initial evaluation last year on 07/30/17, and by 33.93% on the LLE . Artifacts contributing to these symmetrical increases include weight gain and possible systemic fluid retension. Michele [name] will benefit from repeat course of CDT to include manual lymphatic drainage,  (MLD), skin care, ther ex, compression therapies and high emphasis on caregiver education and training. The short term goals of CDT are to decrease lymphatic congestion and tissue density in the limbs and fit with appropriate compression to limit progression and increased nifection risk. Without skilled OT for intensive and management phase CDT condition will worsen and Pt will experience further functional decline.    Occupational Profile and client history currently impacting functional performance  intractible seizures, CP, intelectual delay, quadriparesis, dependent on others for all besic and instrumental ADLs    Occupational performance deficits (Please refer to evaluation for details):  ADL's;IADL's;Work;Play;Leisure;Social Participation;Rest and Sleep;Education    Rehab Potential  Good    Current Impairments/barriers affecting progress:  impaired intellectual delay and quadraparesis    OT Frequency  2x / week    OT Treatment/Interventions  Self-care/ADL training;DME and/or  AE instruction;Manual Therapy;Patient/family education;Manual lymph drainage;Compression bandaging;Therapeutic activities;Other (comment)   BLE skin care during MLD with low ph Eucerin lotion and Michele [name], skin grade castor oil.   Clinical Decision Making  Multiple treatment options, significant modification of task necessary    Recommended Other Services  Fit with appropriate, knee length, BLE compression garments and or devices- For daytime consider custom, flat knit, open toe, Jobst Elvarex elastic classic stockings  And /Or adjustable, Velcro close, CircAid, short stretch leg and foot piece for ease of donning and doffing. Fit w/ HOS devices, ( consider Jobst, ccl 2 Relax)  to limit fibrosis formation and improve lymphatic transport during HOS.       Patient will benefit from skilled therapeutic intervention in order to improve the following deficits and impairments:  Decreased knowledge of use of DME, Decreased skin integrity, Increased edema, Decreased knowledge of precautions, Decreased safety awareness, Difficulty walking, Obesity, Decreased balance  Visit Diagnosis: Lymphedema, not elsewhere classified - Plan: Ot plan of care cert/re-cert    Problem List Patient Active Problem List   Diagnosis Date Noted  . Lennox-Gastaut syndrome (HCC) 07/06/2017  . Lymphedema 01/22/2017  . Bilateral lower extremity edema 04/21/2016  . Unsteadiness on feet   . Amenorrhea 05/19/2014  . 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 Morbid obesity (HCC) 12/24/2012  . 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 URINARY INCONTINENCE 02/25/2010  . Full incontinence of feces 02/25/2010  . Hypothyroidism 01/29/2009  . 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 Cerebral palsy (HCC) 01/29/2009  . GASTROESOPHAGEAL REFLUX DISEASE 01/29/2009  . Osteoporosis 01/29/2009  . Seizure disorder (HCC) 01/29/2009    Michele [name] 08/15/2018, 9:02 AM  [facility] MAIN [facility] SERVICES [address] [place], [place], [address] Phone: [phone]   Fax:  [phone]  Name: Michele [name] MRN: [id number] Date of Birth: 1985/09/09

## 2018-08-17 ENCOUNTER — Other Ambulatory Visit: Payer: Self-pay | Admitting: Student in an Organized Health Care Education/Training Program

## 2018-08-17 DIAGNOSIS — K219 Gastro-esophageal reflux disease without esophagitis: Secondary | ICD-10-CM

## 2018-08-19 ENCOUNTER — Telehealth (INDEPENDENT_AMBULATORY_CARE_PROVIDER_SITE_OTHER): Payer: Self-pay | Admitting: Family

## 2018-08-19 DIAGNOSIS — G40319 Generalized idiopathic epilepsy and epileptic syndromes, intractable, without status epilepticus: Secondary | ICD-10-CM

## 2018-08-19 MED ORDER — DIASTAT ACUDIAL 20 MG RE GEL: RECTAL | 1 refills | Status: DC

## 2018-08-19 NOTE — Telephone Encounter (Signed)
Rx has been printed and placed on [name]'s desk 

## 2018-08-20 ENCOUNTER — Ambulatory Visit: Payer: Medicare Other | Attending: Family Medicine | Admitting: Occupational Therapy

## 2018-08-20 DIAGNOSIS — I890 Lymphedema, not elsewhere classified: Secondary | ICD-10-CM | POA: Diagnosis not present

## 2018-08-20 NOTE — Therapy (Signed)
[place] [facility] MAIN [facility] SERVICES [address] [place], [place], [address] Phone: [phone]   Fax:  [phone]  Occupational Therapy Treatment  Patient Details  Name: Michele [name] MRN: [id number] Date of Birth: 23-Jul-1985 No data recorded  Encounter Date: 08/20/2018  OT End of Session - 08/20/18 1651    Visit Number  2    Number of Visits  36    Date for OT Re-Evaluation  11/05/18    OT Start Time  0800    OT Stop Time  0854    OT Time Calculation (min)  54 min    Activity Tolerance  Patient tolerated treatment well;No increased pain    Behavior During Therapy  WFL for tasks assessed/performed       Past Medical History:  Diagnosis Date  . Cerebral palsy (HCC)   . Hypothyroidism   . Lennox-Gastaut syndrome (HCC)   . Osteoporosis   . Pneumonia   . Profound mental retardation   . Reflux   . Rickets   . Seizures (HCC)     Past Surgical History:  Procedure Laterality Date  . FOOT MASS EXCISION Right   . Gastrocutaneous fistual repair  2012  . PEG TUBE PLACEMENT    . PEG TUBE REMOVAL  2012 ?    There were no vitals filed for this visit.  Subjective Assessment - 08/20/18 0817    Subjective   Michele [name] presents for OT Rx visit 2/ 36 to address BLE LE. Discussed outcomes of comparative limb volumetrics at initial eval and  she agrees to discuss dramatic BLE limb volume increase over time w/ [name]'s PCP. to rule out some other unknown systemic cause.     Patient is accompanied by:  Family member    Pertinent History  moderate, stage II, BLE lymphedema-onset unsure, CP, Hypothyroidism, Lennox-Gastaut Syndrome, hx pneumonia, Siezure disorder, reflux, osteoporosis, falls hx    Limitations  chronic, progressive, stage II BLE lymphedema and associated leg pain, difficulty walking, impaired  balance, impaired cognitive impairment,    Repetition  Increases Symptoms    Special Tests  + Stemmer sign base of toes bilaterally    Currently in Pain?   No/denies   no outward signs/ symptoms of leg pain. Pt is non-verbal.                  OT Treatments/Exercises (OP) - 08/20/18 0001      ADLs   ADL Education Given  Yes      Manual Therapy   Manual Therapy  Edema management;Compression Bandaging    Compression Bandaging  Commenced multi layer, knee length , gradient compression wrapping to RLE. Applied single layer of 0.4 cm  Rosidal foam in circumferential pattern w/ equidistant overlays from pase of toes to below knee. Aplied 8 cm wide wrap to foot and ankle, floowed by 10 cm wrap from low ankle to proxuimal calf. Applied final 12 cm wide shoert stretch wrap from achilles insertion to below knee. Secured all with 2" wide masking tape under stretch net.              OT Education - 08/20/18 1649    Education Details  Commenced initial review of gradient wrapping techniques with parent. Provided CG edu re volumetric outcome dicussing differences between LE and fluid retention in the limbs 2/2 systemic causes    Person(s) Educated  Patient;Parent(s)    Methods  Explanation;Demonstration;Handout;Tactile cues;Verbal cues    Comprehension  Verbalized understanding;Returned demonstration;Need further  instruction;Verbal cues required          OT Long Term Goals - 08/15/18 0904      OT LONG TERM GOAL #1   Title  Pt to follow Lymphedema precautions and prevention strategies with maximum caregiver assistance to limit LE progression and further functional decline. (Primary caregiver modified independent w/ lymphedema precautions and prevention principals and strategies using printed reference to limit LE progression and infection risk.)    Baseline  Dependent    Time  2    Period  Weeks    Status  New    Target Date  --   3rd OT Rx visit     OT LONG TERM GOAL #2   Title  Lymphedema (LE) management/ self-care: Pt able to apply knee length, multi layered, gradient compression wraps, one leg at a time, with max caregiver  assist x 1 using proper techniques within 2 weeks to achieve optimal limb volume reduction in preparation for fitting compression garments/ devices.    Baseline  Dependent      Time  1    Period  Weeks    Status  New    Target Date  --   3rd OTn Rx visit     OT LONG TERM GOAL #3   Title  Lymphedema (LE) management/ self-care:  Pt to achieve at least 10%  BLE limb volume reductions  during Intensive Phase CDT to limit LE progression, to reduce infection risk, and to improve functional mobility and transfers essential for optimal ADLs performance.    Baseline  Dependent     Time  12    Period  Weeks    Status  New    Target Date  11/05/18      OT LONG TERM GOAL #4   Title  Lymphedema (LE) management/ self-care:  Pt to tolerate daily compression wraps, compression garments and/ or HOS devices in keeping w/ prescribed wear regimes within 1 week of issue date of each to progress and retain clinical and functional gains and to limit LE progression.    Baseline  dependent    Time  12    Period  Weeks    Status  New    Target Date  11/05/18      OT LONG TERM GOAL #5   Title  Lymphedema (LE) management/ self-care:  During Management Phase CDT Pt to sustain current limb volumes within 3%, and all other clinical gains achieved during OT treatment iwith maximum caregiver assistance  (to control limb swelling and associated pain/ discomfort, to  limit LE progression, to decrease infection risk and to limit further functional decline.    Baseline  dependent    Time  6    Period  Months    Status  New    Target Date  02/04/19            Plan - 08/20/18 1652    Clinical Impression Statement  After CG edu for comparative limb volumetrics outcomes Pt's mother confirmed verbally her understanding  of differential lymphedema dx and limb swelling related to systemic   failer erelated to kidney, cardiac , etc malfunction. CG agrees to consult with her PCP to rull our contributing causes. It also  appears that De has gained quite a bit of weight since last course of CDT, but mother is unble to verify. Pt tolerated application of compression wraps without difficulty. CG      voiced understanding of techniques after review , including demonstration.  Cont compression wrap edu next visit with opportunities for CG to practice.     Occupational Profile and client history currently impacting functional performance  dependent for lymphedema care, non-verbal    Occupational performance deficits (Please refer to evaluation for details):  ADL's;IADL's;Work;Play;Leisure;Social Participation;Rest and Sleep;Education    Rehab Potential  Good    Clinical Decision Making  Multiple treatment options, significant modification of task necessary    OT Frequency  2x / week    OT Treatment/Interventions  Self-care/ADL training;DME and/or AE instruction;Manual Therapy;Patient/family education;Manual lymph drainage;Compression bandaging;Therapeutic activities;Other (comment)   BLE skin care during MLD with low ph Eucerin lotion and Palma Christii, skin grade castor oil.   Recommended Other Services  Replace worn out existing custom BLE knee length compression stockings- 2 pr-1 to wash and one to wear; replace existing worn out HOS device    Consulted and Agree with Plan of Care  Family member/caregiver       Patient will benefit from skilled therapeutic intervention in order to improve the following deficits and impairments:     Visit Diagnosis: Lymphedema, not elsewhere classified    Problem List Patient Active Problem List   Diagnosis Date Noted  . Lennox-Gastaut syndrome (HCC) 07/06/2017  . Lymphedema 01/22/2017  . Bilateral lower extremity edema 04/21/2016  . Unsteadiness on feet   . Amenorrhea 05/19/2014  . 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 Morbid obesity (HCC) 12/24/2012  . 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 URINARY INCONTINENCE 02/25/2010   . Full incontinence of feces 02/25/2010  . Hypothyroidism 01/29/2009  . 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 Cerebral palsy (HCC) 01/29/2009  . GASTROESOPHAGEAL REFLUX DISEASE 01/29/2009  . Osteoporosis 01/29/2009  . Seizure disorder (HCC) 01/29/2009    Loel Dubonnet, MS, OTR/L, [facility] 08/20/18 5:01 PM  [place] [facility] MAIN [facility] SERVICES [address] [place], [place], [address] Phone: [phone]   Fax:  [phone]  Name: Michele [name] MRN: [id number] Date of Birth: August 07, 1985

## 2018-08-20 NOTE — Telephone Encounter (Signed)
Rx has been faxed to the pharmacy 

## 2018-08-22 ENCOUNTER — Ambulatory Visit: Payer: Medicare Other | Admitting: Occupational Therapy

## 2018-08-26 ENCOUNTER — Other Ambulatory Visit: Payer: Self-pay | Admitting: Student in an Organized Health Care Education/Training Program

## 2018-08-26 ENCOUNTER — Ambulatory Visit (INDEPENDENT_AMBULATORY_CARE_PROVIDER_SITE_OTHER): Payer: Medicare Other | Admitting: Student in an Organized Health Care Education/Training Program

## 2018-08-26 ENCOUNTER — Encounter: Payer: Self-pay | Admitting: Student in an Organized Health Care Education/Training Program

## 2018-08-26 ENCOUNTER — Ambulatory Visit: Payer: Medicare Other | Admitting: Occupational Therapy

## 2018-08-26 ENCOUNTER — Other Ambulatory Visit: Payer: Self-pay

## 2018-08-26 VITALS — BP 118/78 | HR 75 | Temp 97.5°F | Wt 221.0 lb

## 2018-08-26 DIAGNOSIS — I890 Lymphedema, not elsewhere classified: Secondary | ICD-10-CM

## 2018-08-26 DIAGNOSIS — R600 Localized edema: Secondary | ICD-10-CM

## 2018-08-26 NOTE — Progress Notes (Signed)
CC: LE Edema  HPI: Michele [name] is a 33 y.o. female   Michele [name] is a 33 year old with past medical history that is significant for cerebral palsy, Lennox Gasteux syndrome, seizure disorder, hypothyroidism, congenital quadriplegia, profound intellectual disabilities presenting with lower extremity edema.   Bilateral lower extremity edema This is a chronic problem for the patient dating in notes back to 2013. Patient has previously been seen and diagnosed with lymphedema years ago, and previously benefited from lymphedema occupational therapy in the past.  She was recently sent back to lymphedema clinic and has undergone multiple treatments there.  Today, her OT noticed that she seems to have increased weight on which may be related to hypervolemia rather than lymphedema.  Additionally per chart review it appears that the patient was seen in the emergency department on 2/4 at which time a chest x-ray indicated mild cardiomegaly and pulmonary edema.  Her last echo was in 2018 which was normal.  She occasionally takes Lasix for lower extremity edema.  Ismenia is been acting at her baseline per her mother's report.  She has not had any dyspnea, cough, or chest pain.  Review of Symptoms:  See HPI for ROS.   CC, SH/smoking status, and VS noted.  Objective: BP 118/78   Pulse 75   Temp (!) 97.5 F (36.4 C) (Oral)   Wt 221 lb (100.2 kg) Comment: unable to weigh- mom declined  LMP 08/12/2018   SpO2 99%   BMI 44.64 kg/m  GEN: Developmentally delayed and intellectually disabled female sits in wheelchair, appears younger than stated age EYE: EOMI NECK: full ROM, no thyromegaly RESPIRATORY: clear to auscultation bilaterally with no wheezes, rhonchi or rales, poor effort due to inability to follow instructions well CV: RRR, no m/r/g, ~2+ peripheral edema LE bilaterally with legs wrapped GI: soft, non-tender, non-distended, no hepatosplenomegaly SKIN: warm and dry, no rashes or lesions  Assessment  and plan:  Bilateral lower extremity edema This is a chronic problem.  Patient is noted to have increased weight, up 4 pounds from previous visit in 06/2018.  Shortness of breath.  She is behaving at her baseline.  She has been eating her normal diet.  She has a history of hypothyroidism for which she was took Synthroid through her childhood.  She is been off of Synthroid for several years. Patient is very well-appearing in the office today. However, given increase weight, concern from lymphedema OT, and evidence of mild cardiomegaly on recent CXR, we should rule out acute cause for LE edema. - TSH - cardiac echo ordered -CMP - CBC - strict return precautions - follow up in 2 weeks, will call to get pt in sooner if workup warrants closer follow up  Orders Placed This Encounter  Procedures  . CBC with Differential  . Comprehensive metabolic panel    Order Specific Question:   Has the patient fasted?    Answer:   No  . TSH  . ECHOCARDIOGRAM COMPLETE    Standing Status:   Future    Standing Expiration Date:   11/26/2019    Order Specific Question:   Where should this test be performed    Answer:   [place]    Order Specific Question:   Perflutren DEFINITY (image enhancing agent) should be administered unless hypersensitivity or allergy exist    Answer:   Administer Perflutren    Order Specific Question:   Reason for exam-Echo    Answer:   Other-Full Diagnosis List    Order  Specific Question:   Full ICD-10/Reason for Exam    Answer:   Edema [782.3.ICD-9-CM]    No orders of the defined types were placed in this encounter.    Howard Pouch, MD,MS,  PGY3 08/27/2018 7:03 AM

## 2018-08-26 NOTE — Assessment & Plan Note (Signed)
This is a chronic problem.  Patient is noted to have increased weight, up 4 pounds from previous visit in 06/2018.  Shortness of breath.  She is behaving at her baseline.  She has been eating her normal diet.  She has a history of hypothyroidism for which she was took Synthroid through her childhood.  She is been off of Synthroid for several years. Patient is very well-appearing in the office today. However, given increase weight, concern from lymphedema OT, and evidence of mild cardiomegaly on recent CXR, we should rule out acute cause for LE edema. - TSH - cardiac echo ordered -CMP - CBC - strict return precautions - follow up in 2 weeks, will call to get pt in sooner if workup warrants closer follow up

## 2018-08-26 NOTE — Therapy (Signed)
[facility] MAIN [facility] SERVICES [address] [place], [place], [address] Phone: [phone]   Fax:  [phone]  Occupational Therapy Treatment  Patient Details  Name: Michele [name] MRN: [id number] Date of Birth: [birth year] No data recorded  Encounter Date: 08/26/2018  OT End of Session - 08/26/18 1124    Visit Number  3    Number of Visits  36    Date for OT Re-Evaluation  11/05/18    OT Start Time  0905    OT Stop Time  0955    OT Time Calculation (min)  50 min    Activity Tolerance  Patient tolerated treatment well;No increased pain    Behavior During Therapy  WFL for tasks assessed/performed       Past Medical History:  Diagnosis Date  . Cerebral palsy (HCC)   . Hypothyroidism   . Lennox-Gastaut syndrome (HCC)   . Osteoporosis   . Pneumonia   . Profound mental retardation   . Reflux   . Rickets   . Seizures (HCC)     Past Surgical History:  Procedure Laterality Date  . FOOT MASS EXCISION Right   . Gastrocutaneous fistual repair  2012  . PEG TUBE PLACEMENT    . PEG TUBE REMOVAL  2012 ?    There were no vitals filed for this visit.  Subjective Assessment - 08/26/18 1116    Subjective   Ski presents for OT Rx visit 3/ 36 to address BLE LE. Pt presents with BLE knee length wraps in place. Mother reports she has been applying comperession wraps and changing daily as directed since last visit on 08/20/18.     Patient is accompanied by:  Family member    Pertinent History  moderate, stage II, BLE lymphedema-onset unsure, CP, Hypothyroidism, Lennox-Gastaut Syndrome, hx pneumonia, Siezure disorder, reflux, osteoporosis, falls hx    Limitations  chronic, progressive, stage II BLE lymphedema and associated leg pain, difficulty walking, impaired  balance, impaired cognitive impairment,    Repetition  Increases Symptoms    Special Tests  + Stemmer sign base of toes bilaterally    Currently in Pain?  Other (Comment)   no observable  signs of pain                  OT Treatments/Exercises (OP) - 08/26/18 0001      ADLs   ADL Education Given  Yes      Manual Therapy   Manual Therapy  Edema management;Manual Lymphatic Drainage (MLD);Compression Bandaging    Manual Lymphatic Drainage (MLD)  MLD to RLE using short neck sequence, deep abdominal lymphatics, and functional inguinal LN. Provided skin care using low ph castor oil               during mld to hydrate dry skin and improve tissue mobility    Compression Bandaging  BLE knee length short stretch wraps as established.             OT Education - 08/26/18 1123    Education Details  Cont CG edu for multilayer compression wraps    Person(s) Educated  Patient;Parent(s)    Methods  Explanation;Demonstration;Handout;Tactile cues;Verbal cues    Comprehension  Verbalized understanding;Returned demonstration;Need further instruction;Verbal cues required          OT Long Term Goals - 08/15/18 0904      OT LONG TERM GOAL #1   Title  Pt to follow Lymphedema precautions and prevention strategies with maximum caregiver  assistance to limit LE progression and further functional decline. (Primary caregiver modified independent w/ lymphedema precautions and prevention principals and strategies using printed reference to limit LE progression and infection risk.)    Baseline  Dependent    Time  2    Period  Weeks    Status  New    Target Date  --   3rd OT Rx visit     OT LONG TERM GOAL #2   Title  Lymphedema (LE) management/ self-care: Pt able to apply knee length, multi layered, gradient compression wraps, one leg at a time, with max caregiver assist x 1 using proper techniques within 2 weeks to achieve optimal limb volume reduction in preparation for fitting compression garments/ devices.    Baseline  Dependent      Time  1    Period  Weeks    Status  New    Target Date  --   3rd OTn Rx visit     OT LONG TERM GOAL #3   Title  Lymphedema (LE)  management/ self-care:  Pt to achieve at least 10%  BLE limb volume reductions  during Intensive Phase CDT to limit LE progression, to reduce infection risk, and to improve functional mobility and transfers essential for optimal ADLs performance.    Baseline  Dependent     Time  12    Period  Weeks    Status  New    Target Date  11/05/18      OT LONG TERM GOAL #4   Title  Lymphedema (LE) management/ self-care:  Pt to tolerate daily compression wraps, compression garments and/ or HOS devices in keeping w/ prescribed wear regimes within 1 week of issue date of each to progress and retain clinical and functional gains and to limit LE progression.    Baseline  dependent    Time  12    Period  Weeks    Status  New    Target Date  11/05/18      OT LONG TERM GOAL #5   Title  Lymphedema (LE) management/ self-care:  During Management Phase CDT Pt to sustain current limb volumes within 3%, and all other clinical gains achieved during OT treatment iwith maximum caregiver assistance  (to control limb swelling and associated pain/ discomfort, to  limit LE progression, to decrease infection risk and to limit further functional decline.    Baseline  dependent    Time  6    Period  Months    Status  New    Target Date  02/04/19            Plan - 08/26/18 1125    Clinical Impression Statement  BLE are visibly decreased in limb volume below the knees since last visit.  Mild chafing observed at R ankle circumferentially. Added Artiflex cast padding circumferentially at ankle to reduce concentration of compression at narrowest ankles. Pt tolerated all aspects of manual therapy today without difficulty. CG wrapping skills improving steadily. Cont as per OPC.    Occupational Profile and client history currently impacting functional performance  dependent for lymphedema care, non-verbal    Occupational performance deficits (Please refer to evaluation for details):  ADL's;IADL's;Work;Play;Leisure;Social  Participation;Rest and Sleep;Education    Rehab Potential  Good    Clinical Decision Making  Multiple treatment options, significant modification of task necessary    OT Frequency  2x / week    OT Treatment/Interventions  Self-care/ADL training;DME and/or AE instruction;Manual Therapy;Patient/family education;Manual lymph drainage;Compression bandaging;Therapeutic  activities;Other (comment)   BLE skin care during MLD with low ph Eucerin lotion and Palma Christii, skin grade castor oil.   Recommended Other Services  Replace worn out existing custom BLE knee length compression stockings- 2 pr-1 to wash and one to wear; replace existing worn out HOS device    Consulted and Agree with Plan of Care  Family member/caregiver       Patient will benefit from skilled therapeutic intervention in order to improve the following deficits and impairments:     Visit Diagnosis: Lymphedema, not elsewhere classified    Problem List Patient Active Problem List   Diagnosis Date Noted  . Lennox-Gastaut syndrome (HCC) 07/06/2017  . Lymphedema 01/22/2017  . Bilateral lower extremity edema 04/21/2016  . Unsteadiness on feet   . Amenorrhea 05/19/2014  . 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 Morbid obesity (HCC) 12/24/2012  . 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 URINARY INCONTINENCE 02/25/2010  . Full incontinence of feces 02/25/2010  . Hypothyroidism 01/29/2009  . 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 Cerebral palsy (HCC) 01/29/2009  . GASTROESOPHAGEAL REFLUX DISEASE 01/29/2009  . Osteoporosis 01/29/2009  . Seizure disorder (HCC) 01/29/2009    Loel Dubonnet, MS, OTR/L, [facility] 08/26/18 11:34 AM  [facility] MAIN [facility] SERVICES [address] [place], [place], [address] Phone: [phone]   Fax:  [phone]  Name: AMARIE GAISER MRN: [id number] Date of Birth: 01-08-1986

## 2018-08-27 ENCOUNTER — Encounter (HOSPITAL_COMMUNITY): Payer: Self-pay | Admitting: Family Medicine

## 2018-08-27 ENCOUNTER — Telehealth: Payer: Self-pay | Admitting: Student in an Organized Health Care Education/Training Program

## 2018-08-27 ENCOUNTER — Telehealth: Payer: Self-pay | Admitting: *Deleted

## 2018-08-27 ENCOUNTER — Ambulatory Visit (HOSPITAL_COMMUNITY)
Admission: RE | Admit: 2018-08-27 | Discharge: 2018-08-27 | Disposition: A | Discharge status: Home / Self Care | Payer: Medicare Other | Source: Ambulatory Visit | Attending: Family Medicine | Admitting: Family Medicine

## 2018-08-27 DIAGNOSIS — R600 Localized edema: Secondary | ICD-10-CM | POA: Insufficient documentation

## 2018-08-27 DIAGNOSIS — G809 Cerebral palsy, unspecified: Secondary | ICD-10-CM | POA: Diagnosis not present

## 2018-08-27 LAB — CBC WITH DIFFERENTIAL/PLATELET
BASOS: 0 %
Basophils Absolute: 0 10*3/uL (ref 0.0–0.2)
EOS (ABSOLUTE): 0.1 10*3/uL (ref 0.0–0.4)
Eos: 1 %
HEMOGLOBIN: 12.3 g/dL (ref 11.1–15.9)
Hematocrit: 35.8 % (ref 34.0–46.6)
Immature Grans (Abs): 0.1 10*3/uL (ref 0.0–0.1)
Immature Granulocytes: 1 %
Lymphocytes Absolute: 3.9 10*3/uL — ABNORMAL HIGH (ref 0.7–3.1)
Lymphs: 46 %
MCH: 31.7 pg (ref 26.6–33.0)
MCHC: 34.4 g/dL (ref 31.5–35.7)
MCV: 92 fL (ref 79–97)
Monocytes Absolute: 0.7 10*3/uL (ref 0.1–0.9)
Monocytes: 9 %
Neutrophils Absolute: 3.6 10*3/uL (ref 1.4–7.0)
Neutrophils: 43 %
Platelets: 201 10*3/uL (ref 150–450)
RBC: 3.88 x10E6/uL (ref 3.77–5.28)
RDW: 14.7 % (ref 11.7–15.4)
WBC: 8.3 10*3/uL (ref 3.4–10.8)

## 2018-08-27 LAB — COMPREHENSIVE METABOLIC PANEL
ALT: 12 IU/L (ref 0–32)
AST: 19 IU/L (ref 0–40)
Albumin/Globulin Ratio: 1.1 — ABNORMAL LOW (ref 1.2–2.2)
Albumin: 3.6 g/dL — ABNORMAL LOW (ref 3.8–4.8)
Alkaline Phosphatase: 63 IU/L (ref 39–117)
BUN / CREAT RATIO: 17 (ref 9–23)
BUN: 15 mg/dL (ref 6–20)
Bilirubin Total: <0.2 mg/dL (ref 0.0–1.2)
CO2: 23 mmol/L (ref 20–29)
Calcium: 9.7 mg/dL (ref 8.7–10.2)
Chloride: 107 mmol/L — ABNORMAL HIGH (ref 96–106)
Creatinine, Ser: 0.89 mg/dL (ref 0.57–1.00)
GFR calc Af Amer: 99 mL/min/{1.73_m2} (ref >59)
GFR calc non Af Amer: 86 mL/min/{1.73_m2} (ref >59)
Globulin, Total: 3.2 g/dL (ref 1.5–4.5)
Glucose: 84 mg/dL (ref 65–99)
Potassium: 4.4 mmol/L (ref 3.5–5.2)
SODIUM: 145 mmol/L — AB (ref 134–144)
Total Protein: 6.8 g/dL (ref 6.0–8.5)

## 2018-08-27 LAB — TSH: TSH: 2.41 u[IU]/mL (ref 0.450–4.500)

## 2018-08-27 MED ORDER — OSELTAMIVIR PHOSPHATE 75 MG PO CAPS: 75.0000 mg | ORAL_CAPSULE | Freq: Every day | ORAL | 0 refills | Status: DC

## 2018-08-27 NOTE — Telephone Encounter (Signed)
Mom walks in, she was exposed to flu by her OT visit @ [facility] yesterday.  She was called by the director and was told to ask her provider for tamiflu.  Spoke with Dr. Leveda Anna, verbal given and called in as directed. Anab Vivar, Maryjo Rochester, CMA

## 2018-08-27 NOTE — Progress Notes (Signed)
  Echocardiogram 2D Echocardiogram has been performed.  Michele [name] 08/27/2018, 8:51 AM

## 2018-08-28 ENCOUNTER — Ambulatory Visit: Payer: Medicare Other | Admitting: Occupational Therapy

## 2018-08-29 ENCOUNTER — Telehealth: Payer: Self-pay

## 2018-08-29 NOTE — Telephone Encounter (Signed)
Please let Michele [name]'s mother know that her echocardiogram and her blood work were normal. [name]'s swelling is due to venous insufficiency, and she should continue using compression sleeves and leg elevation when possible. Low salt diet will help. We will not make any changes to her medications.

## 2018-08-29 NOTE — Telephone Encounter (Signed)
Patient's mother calling for results of echocardiogram.  Call back is [phone] or [phone]  Ples Specter, RN [facility] RN)

## 2018-08-30 ENCOUNTER — Encounter (HOSPITAL_BASED_OUTPATIENT_CLINIC_OR_DEPARTMENT_OTHER): Payer: Self-pay | Admitting: Student in an Organized Health Care Education/Training Program

## 2018-08-30 NOTE — Telephone Encounter (Signed)
Informed pt mother of below. Michele [name], CMA

## 2018-09-02 ENCOUNTER — Ambulatory Visit: Payer: Medicare Other | Admitting: Occupational Therapy

## 2018-09-04 ENCOUNTER — Ambulatory Visit: Payer: Medicare Other | Admitting: Occupational Therapy

## 2018-09-04 NOTE — Telephone Encounter (Signed)
Pt mother would like to know if her daughter still needs to come into the office for her f/u next Monday or if it can be discussed over the phone to keep from bringing her into the office. Please advise

## 2018-09-05 NOTE — Telephone Encounter (Signed)
Called and spoke with the patient's mother. Michele [name]'s appointment was to go over results, no new complaints. Her mother is agreeable to cancelling the appointment in favor of staying home and practicing social distancing in the setting of the coronavirus pandemic. I will cancel her appointment.

## 2018-09-09 ENCOUNTER — Ambulatory Visit: Payer: Medicare Other | Admitting: Occupational Therapy

## 2018-09-09 ENCOUNTER — Encounter: Payer: Self-pay | Admitting: Occupational Therapy

## 2018-09-09 ENCOUNTER — Ambulatory Visit: Payer: Medicare Other | Admitting: Student in an Organized Health Care Education/Training Program

## 2018-09-09 NOTE — Therapy (Signed)
Left VM on Pt''s mother's phone stating I called to check in on Michele [name] and to address any questions or concerns since Outpatient Rehab closed abruptly last week due to caronavirus. Left outpatient rehab's direct phone number and instructed mother to call with any questions or concerns.  Loel Dubonnet, Michele, OTR/L, CLT-LANA

## 2018-09-11 ENCOUNTER — Ambulatory Visit: Payer: Medicare Other | Admitting: Occupational Therapy

## 2018-09-16 ENCOUNTER — Encounter: Payer: Medicare Other | Admitting: Occupational Therapy

## 2018-09-18 ENCOUNTER — Encounter: Payer: Medicare Other | Admitting: Occupational Therapy

## 2018-09-23 ENCOUNTER — Encounter: Payer: Medicare Other | Admitting: Occupational Therapy

## 2018-09-25 ENCOUNTER — Encounter: Payer: Medicare Other | Admitting: Occupational Therapy

## 2018-09-30 ENCOUNTER — Encounter: Payer: Medicare Other | Admitting: Occupational Therapy

## 2018-10-02 ENCOUNTER — Encounter: Payer: Medicare Other | Admitting: Occupational Therapy

## 2018-10-07 ENCOUNTER — Encounter: Payer: Medicare Other | Admitting: Occupational Therapy

## 2018-10-09 ENCOUNTER — Encounter: Payer: Medicare Other | Admitting: Occupational Therapy

## 2018-10-14 ENCOUNTER — Encounter: Payer: Medicare Other | Admitting: Occupational Therapy

## 2018-10-15 ENCOUNTER — Other Ambulatory Visit: Payer: Self-pay | Admitting: Family Medicine

## 2018-10-16 ENCOUNTER — Encounter: Payer: Medicare Other | Admitting: Occupational Therapy

## 2018-10-19 ENCOUNTER — Other Ambulatory Visit: Payer: Self-pay | Admitting: Student in an Organized Health Care Education/Training Program

## 2018-10-21 ENCOUNTER — Encounter: Payer: Medicare Other | Admitting: Occupational Therapy

## 2018-10-23 ENCOUNTER — Encounter: Payer: Medicare Other | Admitting: Occupational Therapy

## 2018-10-28 ENCOUNTER — Encounter: Payer: Medicare Other | Admitting: Occupational Therapy

## 2018-10-28 ENCOUNTER — Other Ambulatory Visit: Payer: Self-pay | Admitting: Student in an Organized Health Care Education/Training Program

## 2018-10-30 ENCOUNTER — Encounter: Payer: Medicare Other | Admitting: Occupational Therapy

## 2018-11-04 ENCOUNTER — Encounter: Payer: Medicare Other | Admitting: Occupational Therapy

## 2018-11-06 ENCOUNTER — Encounter: Payer: Medicare Other | Admitting: Occupational Therapy

## 2018-11-13 ENCOUNTER — Encounter: Payer: Medicare Other | Admitting: Occupational Therapy

## 2018-11-14 ENCOUNTER — Other Ambulatory Visit: Payer: Self-pay | Admitting: Student in an Organized Health Care Education/Training Program

## 2018-11-14 DIAGNOSIS — R600 Localized edema: Secondary | ICD-10-CM

## 2018-11-20 ENCOUNTER — Ambulatory Visit (INDEPENDENT_AMBULATORY_CARE_PROVIDER_SITE_OTHER): Payer: Medicare Other | Admitting: Orthopaedic Surgery

## 2018-11-20 ENCOUNTER — Other Ambulatory Visit: Payer: Self-pay

## 2018-11-20 ENCOUNTER — Ambulatory Visit (INDEPENDENT_AMBULATORY_CARE_PROVIDER_SITE_OTHER): Payer: Medicare Other | Admitting: Student in an Organized Health Care Education/Training Program

## 2018-11-20 ENCOUNTER — Encounter: Payer: Self-pay | Admitting: Orthopaedic Surgery

## 2018-11-20 ENCOUNTER — Encounter: Payer: Self-pay | Admitting: Student in an Organized Health Care Education/Training Program

## 2018-11-20 VITALS — Ht 59.0 in | Wt 221.0 lb

## 2018-11-20 VITALS — BP 110/68

## 2018-11-20 DIAGNOSIS — G40814 Lennox-Gastaut syndrome, intractable, without status epilepticus: Secondary | ICD-10-CM | POA: Diagnosis not present

## 2018-11-20 DIAGNOSIS — G809 Cerebral palsy, unspecified: Secondary | ICD-10-CM

## 2018-11-20 DIAGNOSIS — Q666 Other congenital valgus deformities of feet: Secondary | ICD-10-CM | POA: Diagnosis not present

## 2018-11-20 DIAGNOSIS — I890 Lymphedema, not elsewhere classified: Secondary | ICD-10-CM | POA: Diagnosis not present

## 2018-11-20 NOTE — Progress Notes (Signed)
   CC: needs hospital bed  HPI: Michele [name] is a 33 y.o. female with PMH significant for:  Past Medical History:  Diagnosis Date  . Cerebral palsy (HCC)   . Hypothyroidism   . Lennox-Gastaut syndrome (HCC)   . Osteoporosis   . Pneumonia   . Profound mental retardation   . Reflux   . Rickets   . Seizures [facility])      Patient's mother brings her in because she requires a hospital bed for frequent repositioning. Patient has had a hospital bed in the past however she now requires a new one as her previous one is no longer functioning.   Michele [name] is mostly wheelchair bound due to hx of cerebral palsy. She has significant intellectual disability. She gets significant swelling in her legs and therefore requires leg elevation when possible.  Review of Symptoms:  See HPI for ROS.   CC, SH/smoking status, and VS noted.  Objective: BP 110/68   LMP 11/20/2018 (Exact Date)  GEN: NAD, alert, cooperative, and pleasant, global developmental delay RESPIRATORY: Comfortable work of breathing, speaks in full sentences CV: Regular rate noted, distal extremities well perfused and warm without edema GI: Soft, nondistended SKIN: warm and dry, no rashes or lesions NEURO: II-XII grossly intact MSK: Moves 4 extremities equally PSYCH: global developmental delay  Assessment and plan:  Cerebral palsy (HCC) Due to hx of cerebral palsy, patient requires frequent changes in body position. The patient has a medical condition which requires positioning of the body in ways not feasible with an ordinary bed.  - script for semi-electric hospital bed was sent - patient will require this long-term for her lifetime.   Orders Placed This Encounter  Procedures  . For home use only DME Hospital bed    Order Specific Question:   Length of Need    Answer:   Lifetime    Order Specific Question:   The above medical condition requires:    Answer:   Patient requires the ability to reposition frequently    Order  Specific Question:   Bed type    Answer:   Semi-electric    No orders of the defined types were placed in this encounter.    Howard Pouch, MD,MS,  PGY3 11/21/2018 4:28 PM

## 2018-11-20 NOTE — Patient Instructions (Signed)
It was a pleasure seeing you today in our clinic and it has been a pleasure caring for you. If you have any difficulty with the paper script for the hospital bed please give me a call.   Our clinic's number is [phone]. Please call with questions or concerns about what we discussed today.  Be well, Dr. Mosetta Putt

## 2018-11-20 NOTE — Progress Notes (Signed)
Office Visit Note   Patient: Michele [name]           Date of Birth: 1986/04/20           MRN: [id number] Visit Date: 11/20/2018              Requested by: Howard PouchFeng, Lauren, MD [address] [place], [place] [id number] PCP: Howard PouchFeng, Lauren, MD   Assessment & Plan: Visit Diagnoses:  1. Lymphedema   2. 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 Congenital pes planovalgus     Plan: She can call with lymphedema clinic get rescheduled.  New TED hose needed.  We discussed applying her old TED hose first and in the morning when she first wakes up when the edema is the least during the day.  Patient has planovalgus foot deformity but does not ambulate enough to have any specific special shoe needs.  Follow-up here on a as needed basis.  Follow-Up Instructions: Return if symptoms worsen or fail to improve.   Orders:  No orders of the defined types were placed in this encounter.  No orders of the defined types were placed in this encounter.     Procedures: No procedures performed   Clinical Data: No additional findings.   Subjective: Chief Complaint  Patient presents with  . Right Foot - Edema  . Left Foot - Edema    HPI 33 year old female with Lennox-Gastaut syndrome and bilateral chronic lymphedema with planovalgus foot deformity has had increased lymphedema since the lymphedema clinic and [facility] has been closed due to the coronavirus.  Her feet are down and swelling when she first wakes up.  When she is wear TED hose she has had some problems with increased that occurs anteriorly at the ankle joint.  She has been walking some.  Her mother had a call from the lymphedema clinic and she anticipates scheduling appointment as they reopen shortly.  She has TED hose but needs some new ones.  Sometimes uses Lasix which certainly helps with the edema.  Right great toe nail was noted some subungual hematoma at the proximal portion with unrecognized trauma.   Review of Systems System positive for seizures, above syndrome, urinary incontinence morbid obesity osteoporosis, lymphedema, otherwise 14 point view of systems negative as pertains HPI.  Objective: Vital Signs: Ht 4\' 11"  (1.499 m)   Wt 221 lb (100.2 kg)   BMI 44.64 kg/m   Physical Exam Constitutional:      Appearance: She is well-developed.  HENT:     Head: Normocephalic.     Right Ear: External ear normal.     Left Ear: External ear normal.  Eyes:     Pupils: Pupils are equal, round, and reactive to light.  Neck:     Thyroid: No thyromegaly.     Trachea: No tracheal deviation.  Cardiovascular:     Rate and Rhythm: Normal rate.  Pulmonary:     Effort: Pulmonary effort is normal.  Abdominal:     Palpations: Abdomen is soft.  Skin:    General: Skin is warm and dry.  Neurological:     Mental Status: She is alert. Mental status is at baseline.     Ortho Exam patient has small right subungual hematoma proximal portion of the nailbed without eponychial hematoma.  Good capillary refill of the tip.  Motion extension flexion of the toe does not appear to be painful.  There is bilateral lymphedema noted.  No evidence of cellulitis.  Specialty Comments:  No specialty comments available.  Imaging: No results found.   PMFS History: Patient Active Problem List   Diagnosis Date Noted  . Lennox-Gastaut syndrome (HCC) 07/06/2017  . Lymphedema 01/22/2017  . Bilateral lower extremity edema 04/21/2016  . Unsteadiness on feet   . Amenorrhea 05/19/2014  . 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 Morbid obesity (HCC) 12/24/2012  . 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 URINARY INCONTINENCE 02/25/2010  . Full incontinence of feces 02/25/2010  . Hypothyroidism 01/29/2009  . 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 Cerebral palsy (HCC) 01/29/2009  . GASTROESOPHAGEAL REFLUX DISEASE 01/29/2009  . Osteoporosis 01/29/2009  . Seizure  disorder (HCC) 01/29/2009   Past Medical History:  Diagnosis Date  . Cerebral palsy (HCC)   . Hypothyroidism   . Lennox-Gastaut syndrome (HCC)   . Osteoporosis   . Pneumonia   . Profound mental retardation   . Reflux   . Rickets   . Seizures (HCC)     Family History  Problem Relation Age of Onset  . Hypertension Mother   . Heart disease Maternal Grandmother        Died at 31  . Stroke Maternal Grandfather        died at 58  . Hypertension Paternal Grandmother   . Diabetes Other        MGGM  . Heart disease Other        MGGF  . Kidney disease Other        Maternal Great Aunt   . Cancer Maternal Aunt        lung  . Colon cancer Neg Hx     Past Surgical History:  Procedure Laterality Date  . FOOT MASS EXCISION Right   . Gastrocutaneous fistual repair  2012  . PEG TUBE PLACEMENT    . PEG TUBE REMOVAL  2012 ?   Social History   Occupational History  . Occupation: Disabled   Tobacco Use  . Smoking status: Never Smoker  . Smokeless tobacco: Never Used  Substance and Sexual Activity  . Alcohol use: No  . Drug use: No  . Sexual activity: Never

## 2018-11-21 ENCOUNTER — Encounter: Payer: Self-pay | Admitting: Student in an Organized Health Care Education/Training Program

## 2018-11-21 NOTE — Assessment & Plan Note (Signed)
Due to hx of cerebral palsy, patient requires frequent changes in body position. The patient has a medical condition which requires positioning of the body in ways not feasible with an ordinary bed.  - script for semi-electric hospital bed was sent - patient will require this long-term for her lifetime.

## 2018-11-27 ENCOUNTER — Other Ambulatory Visit: Payer: Self-pay

## 2018-11-27 ENCOUNTER — Ambulatory Visit: Payer: Medicare Other | Attending: Family Medicine | Admitting: Occupational Therapy

## 2018-11-27 DIAGNOSIS — I890 Lymphedema, not elsewhere classified: Secondary | ICD-10-CM | POA: Insufficient documentation

## 2018-11-27 NOTE — Therapy (Signed)
[facility] MAIN [facility] [address] [place], [place], [address] Phone: [phone]   Fax:  [phone]  Occupational Therapy Treatment  Patient Details  Name: Michele PineRobin C Herrman MRN: [id number] Date of Birth: 05/15/1986 No data recorded  Encounter Date: 11/27/2018  OT End of Session - 11/27/18 1127    Visit Number  4    Number of Visits  36    Date for OT Re-Evaluation  02/25/19    OT Start Time  0915    OT Stop Time  1015    OT Time Calculation (min)  60 min    Activity Tolerance  Patient tolerated treatment well;No increased pain    Behavior During Therapy  WFL for tasks assessed/performed       Past Medical History:  Diagnosis Date  . Cerebral palsy (HCC)   . Hypothyroidism   . Lennox-Gastaut syndrome (HCC)   . Osteoporosis   . Pneumonia   . Profound mental retardation   . Reflux   . Rickets   . Seizures (HCC)     Past Surgical History:  Procedure Laterality Date  . FOOT MASS EXCISION Right   . Gastrocutaneous fistual repair  2012  . PEG TUBE PLACEMENT    . PEG TUBE REMOVAL  2012 ?    There were no vitals filed for this visit.  Subjective Assessment - 11/27/18 0919    Subjective   Michele [name] presents for OT Rx visit 4/ 36 to address BLE LE. Pt was last seen on 08/26/18. Intensive phase CDT was interrupted by corona virus closing of outpatient rehab services. Pt's mother accompanies her today. Her mother reports that she thinks Michele [name] has gained significant amout of weight during quarantine. Pt's mother, , Michele [name], requests , despite increased leg volumed identidied in March, that we skip CDT and fit   replacement garments ASAP b/c  she has   transportation problems   at present.    Patient is accompanied by:  Family member    Pertinent History  moderate, stage II, BLE lymphedema-onset unsure, CP, Hypothyroidism, Lennox-Gastaut Syndrome, hx pneumonia, Siezure disorder, reflux, osteoporosis, falls hx    Limitations  chronic,  progressive, stage II BLE lymphedema and associated leg pain, difficulty walking, impaired  balance, impaired cognitive impairment,    Repetition  Increases Symptoms    Special Tests  + Stemmer sign base of toes bilaterally    Currently in Pain?  No/denies                   OT Treatments/Exercises (OP) - 11/27/18 0001      ADLs   ADL Education Given  Yes      Manual Therapy   Manual Therapy  Edema management    Manual therapy comments  completed anatomical measurements for replacement comprression garments for custom stockings and HOS devices. Faxed to DME vendor.             OT Education - 11/27/18 0925    Education Details  Cont family CG edu for LE self care. Reviewed garment  measuring, ordering and fitting process. Provided vendor contact.    Person(s) Educated  Patient;Parent(s)    Methods  Explanation;Demonstration;Handout;Tactile cues;Verbal cues    Comprehension  Verbalized understanding;Returned demonstration;Need further instruction;Verbal cues required          OT Long Term Goals - 08/15/18 0904      OT LONG TERM GOAL #1   Title  Pt to follow Lymphedema precautions and prevention  strategies with maximum caregiver assistance to limit LE progression and further functional decline. (Primary caregiver modified independent w/ lymphedema precautions and prevention principals and strategies using printed reference to limit LE progression and infection risk.)    Baseline  Dependent    Time  2    Period  Weeks    Status  New    Target Date  --   3rd OT Rx visit     OT LONG TERM GOAL #2   Title  Lymphedema (LE) management/ self-care: Pt able to apply knee length, multi layered, gradient compression wraps, one leg at a time, with max caregiver assist x 1 using proper techniques within 2 weeks to achieve optimal limb volume reduction in preparation for fitting compression garments/ devices.    Baseline  Dependent      Time  1    Period  Weeks    Status   New    Target Date  --   3rd OTn Rx visit     OT LONG TERM GOAL #3   Title  Lymphedema (LE) management/ self-care:  Pt to achieve at least 10%  BLE limb volume reductions  during Intensive Phase CDT to limit LE progression, to reduce infection risk, and to improve functional mobility and transfers essential for optimal ADLs performance.    Baseline  Dependent     Time  12    Period  Weeks    Status  New    Target Date  11/05/18      OT LONG TERM GOAL #4   Title  Lymphedema (LE) management/ self-care:  Pt to tolerate daily compression wraps, compression garments and/ or HOS devices in keeping w/ prescribed wear regimes within 1 week of issue date of each to progress and retain clinical and functional gains and to limit LE progression.    Baseline  dependent    Time  12    Period  Weeks    Status  New    Target Date  11/05/18      OT LONG TERM GOAL #5   Title  Lymphedema (LE) management/ self-care:  During Management Phase CDT Pt to sustain current limb volumes within 3%, and all other clinical gains achieved during OT treatment iwith maximum caregiver assistance  (to control limb swelling and associated pain/ discomfort, to  limit LE progression, to decrease infection risk and to limit further functional decline.    Baseline  dependent    Time  6    Period  Months    Status  New    Target Date  02/04/19            Plan - 11/27/18 1128    Clinical Impression Statement  Despite significantly increased limb volumes identified in March before Covid   quarantine limited Pt's access to care, Pt's mother requests foregoingintensive phase CDT ( compression wraps, MLD, skin care and ther ex) and prioritizing replacing worn out, poorly fitting custom compression garments and nightime devices. Pt's mother has serious   car repairs   she's unable to afford at present, so attendance at  frequent medical appointments is not ossible at this time. In keeping with mother's wishes  we completed  anatomical measurements for replacement compression garments/ devices and submitted to DME vendor. Vendor will ship directly tp Pt's mother and Michele [name] will return for fittting ASAP.     Occupational Profile and client history currently impacting functional performance  dependent for lymphedema care, non-verbal    Occupational performance  deficits (Please refer to evaluation for details):  ADL's;IADL's;Work;Play;Leisure;Social Participation;Rest and Sleep;Education    Rehab Potential  Good    Clinical Decision Making  Multiple treatment options, significant modification of task necessary    OT Frequency  2x / week    OT Treatment/Interventions  Self-care/ADL training;DME and/or AE instruction;Manual Therapy;Patient/family education;Manual lymph drainage;Compression bandaging;Therapeutic activities;Other (comment)   BLE skin care during MLD with low ph Eucerin lotion and [place] Christii, skin grade castor oil.   Recommended Other Services  Replace worn out existing custom BLE knee length compression stockings- 2 pr-1 to wash and one to wear; replace existing worn out HOS device    Consulted and Agree with Plan of Care  Family member/caregiver       Patient will benefit from skilled therapeutic intervention in order to improve the following deficits and impairments:     Visit Diagnosis: Lymphedema, not elsewhere classified - Plan: Ot plan of care cert/re-cert    Problem List Patient Active Problem List   Diagnosis Date Noted  . Lennox-Gastaut syndrome ([place]) 07/06/2017  . Lymphedema 01/22/2017  . Bilateral lower extremity edema 04/21/2016  . Unsteadiness on feet   . Amenorrhea 05/19/2014  . 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 Morbid obesity ([place]) 12/24/2012  . 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 URINARY INCONTINENCE 02/25/2010  . Full incontinence of feces 02/25/2010  . Hypothyroidism 01/29/2009  . Profound intellectual disabilities  01/29/2009  . Cerebral palsy ([place]) 01/29/2009  . GASTROESOPHAGEAL REFLUX DISEASE 01/29/2009  . Osteoporosis 01/29/2009  . Seizure disorder ([place]) 01/29/2009   Andrey Spearman, MS, OTR/L, [facility] 11/27/18 11:38 AM   [place] MAIN [facility] SERVICES [address] [place], [place], [address] Phone: [phone]   Fax:  [phone]  Name: BLANCH STANG MRN: [id number] Date of Birth: [birth year]

## 2018-12-02 ENCOUNTER — Other Ambulatory Visit: Payer: Self-pay | Admitting: Student in an Organized Health Care Education/Training Program

## 2018-12-04 ENCOUNTER — Telehealth: Payer: Self-pay | Admitting: Student in an Organized Health Care Education/Training Program

## 2018-12-04 ENCOUNTER — Encounter: Payer: Medicare Other | Admitting: Occupational Therapy

## 2018-12-04 NOTE — Telephone Encounter (Signed)
[place] form [employer] [facility] called informing that pt's PCP isn't [facility] certified. Please give [place] a call back.

## 2018-12-05 ENCOUNTER — Telehealth: Payer: Self-pay | Admitting: Student in an Organized Health Care Education/Training Program

## 2018-12-05 NOTE — Telephone Encounter (Signed)
Pt's mom called today about pt's hospital bed for home that was signed by her PCP but the [facility] for pt's bed needs to be signed by an MD that is [employer]. Please give pt's mom a call back.

## 2018-12-09 ENCOUNTER — Other Ambulatory Visit: Payer: Self-pay | Admitting: Family Medicine

## 2018-12-09 NOTE — Telephone Encounter (Signed)
Dr. Erin Hearing signed the script under my name and I placed it back in the fax pile.

## 2018-12-11 ENCOUNTER — Encounter: Payer: Medicare Other | Admitting: Occupational Therapy

## 2018-12-11 NOTE — Telephone Encounter (Signed)
LVM to call office to inform pt mom of below.Michele [name], CMA

## 2018-12-11 NOTE — Telephone Encounter (Signed)
THis was handled in another phone note. Dorrie Cocuzza Zimmerman Rumple, CMA

## 2018-12-12 NOTE — Telephone Encounter (Signed)
Pt mom had called and stated that they had not received the form with [employer] certified signature.  Originally the form that was completed was not for the hospital bed.  Form was located and signed by [facility] certified doctor and re-faxed to the correct place.  Mom was going to check to be sure that they had received it today.  Form will be set to scan in pts chart.Hamish Banks Zimmerman Rumple, CMA

## 2018-12-17 ENCOUNTER — Telehealth: Payer: Self-pay | Admitting: *Deleted

## 2018-12-17 ENCOUNTER — Other Ambulatory Visit: Payer: Self-pay | Admitting: Student in an Organized Health Care Education/Training Program

## 2018-12-17 DIAGNOSIS — R600 Localized edema: Secondary | ICD-10-CM

## 2018-12-17 NOTE — Telephone Encounter (Signed)
LVM on home and mobile to call office back.  I called [facility] to see what was going on with this request.  I spoke with a referral person by the name of Daria and she stated that she did not receive the fax with the [employer] certified doctor.  I told her we have sent it twice with two different [employer] certified doctors.  Went and found fax and faxed again. Called back and confirmed that they received the fax, she reviewed it to be sure that everything was on it and she stated that she would send it to the department that would take care of getting this set up,  I asked her how long did she think it would be and she could not give me a time but said it should be pretty quick depending on how many orders were before this one. Ita Fritzsche Zimmerman Rumple, CMA

## 2018-12-17 NOTE — Telephone Encounter (Signed)
Pts mom called back and I informed her of below. Told her if she does not hear from them to please give me a call back so I can figure out what is going on. Aqil Goetting Zimmerman Rumple, CMA

## 2018-12-18 ENCOUNTER — Encounter: Payer: Medicare Other | Admitting: Occupational Therapy

## 2018-12-18 ENCOUNTER — Other Ambulatory Visit (INDEPENDENT_AMBULATORY_CARE_PROVIDER_SITE_OTHER): Payer: Self-pay | Admitting: Family

## 2018-12-18 DIAGNOSIS — G40319 Generalized idiopathic epilepsy and epileptic syndromes, intractable, without status epilepticus: Secondary | ICD-10-CM

## 2018-12-18 MED ORDER — KEPPRA 100 MG/ML PO SOLN: ORAL | 5 refills | Status: DC

## 2018-12-18 MED ORDER — LEVOCARNITINE 1 GM/10ML PO SOLN: ORAL | 5 refills | Status: DC

## 2018-12-18 MED ORDER — DIASTAT ACUDIAL 20 MG RE GEL: RECTAL | 5 refills | Status: DC

## 2018-12-18 MED ORDER — DEPAKOTE SPRINKLES 125 MG PO CSDR: DELAYED_RELEASE_CAPSULE | ORAL | 5 refills | Status: DC

## 2018-12-18 MED ORDER — TOPAMAX SPRINKLE 25 MG PO CPSP: ORAL_CAPSULE | ORAL | 5 refills | Status: DC

## 2018-12-18 MED ORDER — BANZEL 40 MG/ML PO SUSP: ORAL | 5 refills | Status: DC

## 2018-12-23 ENCOUNTER — Encounter (INDEPENDENT_AMBULATORY_CARE_PROVIDER_SITE_OTHER): Payer: Self-pay | Admitting: Family

## 2018-12-23 ENCOUNTER — Ambulatory Visit (INDEPENDENT_AMBULATORY_CARE_PROVIDER_SITE_OTHER): Payer: Medicare Other | Admitting: Family

## 2018-12-23 ENCOUNTER — Other Ambulatory Visit: Payer: Self-pay

## 2018-12-23 DIAGNOSIS — G40909 Epilepsy, unspecified, not intractable, without status epilepticus: Secondary | ICD-10-CM | POA: Diagnosis not present

## 2018-12-23 DIAGNOSIS — F73 Profound intellectual disabilities: Secondary | ICD-10-CM | POA: Diagnosis not present

## 2018-12-23 DIAGNOSIS — G40319 Generalized idiopathic epilepsy and epileptic syndromes, intractable, without status epilepticus: Secondary | ICD-10-CM

## 2018-12-23 DIAGNOSIS — G40814 Lennox-Gastaut syndrome, intractable, without status epilepticus: Secondary | ICD-10-CM

## 2018-12-23 NOTE — Patient Instructions (Signed)
Thank you for talking with me by phone today.   Instructions for you until your next appointment are as follows: 1. Continue giving Michele [name]'s medications as you have been giving them.  2. Let me know if Michele [name] has any seizures.  3. Please sign up for MyChart if you have not done so 4. Please plan to return for follow up in 6 months or sooner if needed.

## 2018-12-23 NOTE — Progress Notes (Signed)
This is a Pediatric Specialist E-Visit follow up consult provided via Telephone Michele [name] and their parent/guardian Michele [name] (name of consenting adult) consented to an E-Visit consult today.  Location of patient: Michele [name] is at Home (location) Location of provider: Elveria Risingina Blaine Guiffre, FNP is at office (location) Patient was referred by Howard PouchFeng, Lauren, MD   The following participants were involved in this E-Visit: Michele [name],CMA            Elveria Risingina Sadeen Wiegel, FNP (list of participants and their roles)  Chief Complain/ Reason for E-Visit today: Seizures Total time on call: 13 min Follow up: 6 months     Michele [name]   MRN:  [id number]  07/04/1985   Provider: Elveria Risingina Chantele Corado NP-C [facility]  Visit type: Routine Follow-Up  Last visit: 06/24/2018   [facility]  Brief history:  Copied from previous record: [name] 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 She is taking and tolerating brand Depakote, Keppra, Topamax and Banzel for her seizure disorder. Michele [name] typically turns blue and requires blow by oxygen when seizures occur. She is completely dependent upon others for all activities of daily living.   Today's concerns: Mom reports today that Michele [name] has been remained seizure free since her last visit. She is compliant with medication and usually sleeps all night. She was treated in the ED in February for influenza. Mom said that she had an episode during that illness where she was difficult to arouse but did not have convulsive seizure activity. She has ongoing problems with lymphedema in her feet and lower legs. Michele [name] is being cared for at home and not attending community activities due to Covid 19 pandemic.  Michele [name] has been otherwise generally healthy since she was last seen. Mom has no other health concerns for Michele [name] today other than  previously mentioned.   Review of systems: Please see HPI for neurologic and other pertinent review of systems. Otherwise all other systems were reviewed and were negative.  Problem List: Patient Active Problem List   Diagnosis Date Noted  . Lennox-Gastaut syndrome (HCC) 07/06/2017  . Lymphedema 01/22/2017  . Bilateral lower extremity edema 04/21/2016  . Unsteadiness on feet   . Amenorrhea 05/19/2014  . 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 Morbid obesity (HCC) 12/24/2012  . 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 URINARY INCONTINENCE 02/25/2010  . Full incontinence of feces 02/25/2010  . Hypothyroidism 01/29/2009  . 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 Cerebral palsy (HCC) 01/29/2009  . GASTROESOPHAGEAL REFLUX DISEASE 01/29/2009  . Osteoporosis 01/29/2009  . Seizure disorder (HCC) 01/29/2009     Past Medical History:  Diagnosis Date  . Cerebral palsy (HCC)   . Hypothyroidism   . Lennox-Gastaut syndrome (HCC)   . Osteoporosis   . Pneumonia   . Profound mental retardation   . Reflux   . Rickets   . Seizures (HCC)     Past medical history comments: See HPI   Surgical history: Past Surgical History:  Procedure Laterality Date  . FOOT MASS EXCISION Right   . Gastrocutaneous fistual repair  2012  . PEG TUBE PLACEMENT    . PEG TUBE REMOVAL  2012 ?     Family history: family history includes Cancer in her maternal aunt; Diabetes in an other family member; Heart disease in her maternal grandmother and another family member; Hypertension in her mother and paternal grandmother; Kidney disease  in an other family member; Stroke in her maternal grandfather.   Social history: Social History   Socioeconomic History  . Marital status: Single    Spouse name: Not on file  . Number of children: 0  . Years of education: Not on file  . Highest education level: Not on file  Occupational History  . Occupation: Disabled    Social Needs  . Financial resource strain: Not on file  . Food insecurity    Worry: Not on file    Inability: Not on file  . Transportation needs    Medical: Not on file    Non-medical: Not on file  Tobacco Use  . Smoking status: Never Smoker  . Smokeless tobacco: Never Used  Substance and Sexual Activity  . Alcohol use: No  . Drug use: No  . Sexual activity: Never  Lifestyle  . Physical activity    Days per week: Not on file    Minutes per session: Not on file  . Stress: Not on file  Relationships  . Social Musicianconnections    Talks on phone: Not on file    Gets together: Not on file    Attends religious service: Not on file    Active member of club or organization: Not on file    Attends meetings of clubs or organizations: Not on file    Relationship status: Not on file  . Intimate partner violence    Fear of current or ex partner: Not on file    Emotionally abused: Not on file    Physically abused: Not on file    Forced sexual activity: Not on file  Other Topics Concern  . Not on file  Social History Narrative   0 caffeine drinks    Lives with mother who helps ADLs.   Graduated from [employer] in 2010. Now attends day program at [employer].    Enjoys playing in water, ripping paper, watching TV, music and going out.      Past/failed meds:  Carbamazepine  Allergies: Allergies  Allergen Reactions  . Carbamazepine Rash      Immunizations: Immunization History  Administered Date(s) Administered  . Influenza Split 04/14/2011, 03/22/2012  . Influenza Whole 05/26/2009, 04/04/2010  . Influenza,inj,Quad PF,6+ Mos 04/03/2013, 04/04/2014, 03/08/2015, 03/08/2016, 03/26/2017, 04/12/2018  . Tdap 01/14/2014      Physical Exam: There were no vitals taken for this visit. There was no examination as it was a telephone visit.   Impression: 1. Lennox Gastaut encephalopathy 2. Generalized non-convulsive and convulsive epilepsy 3. Increased tone in her legs 4. Significant  developmental and intellectual delays   Recommendations for plan of care: [facility]. She has neither had nor required imaging or lab studies since the last visit. Michele [name] is a 33 year old woman with history of Lennox Gastaut encephalopathy, convulsive and nonconvulsive seizures, increased tone in her legs and significant developmental and intellectual delays.  She is taking and tolerating brand Depakote, Keppra, Topamax, and Banzel and has remained seizure free since her last visit on this regimen I talked with Mom about the importance of continued compliance with medication and for Trudie to get at least 8 hours of sleep each night. I asked Mom to let me know if Michele [name] has any seizures. I will see her back in follow up in 6 months or sooner if needed.   The medication list was reviewed and reconciled. No changes were made in the prescribed medications today. A complete medication  list was provided to the patient.  Allergies as of 12/23/2018      Reactions   Carbamazepine Rash      Medication List       Accurate as of December 23, 2018 10:36 AM. If you have any questions, ask your nurse or doctor.        STOP taking these medications   guaiFENesin 100 MG/5ML liquid Commonly known as: ROBITUSSIN Stopped by: Elveria Risingina Shareese Macha, NP   ondansetron 4 MG/5ML solution Commonly known as: ZOFRAN Stopped by: Elveria Risingina Elaiza Shoberg, NP   oseltamivir 75 MG capsule Commonly known as: Tamiflu Stopped by: Elveria Risingina Odean Mcelwain, NP     TAKE these medications   acetaminophen 160 MG/5ML suspension Commonly known as: TYLENOL Take 320 mg by mouth every 6 (six) hours as needed for mild pain.   acetaminophen 650 MG suppository Commonly known as: TYLENOL Place 650 mg rectally every 8 (eight) hours as needed for mild pain.   Banzel 40 MG/ML Susp Generic drug: Rufinamide Take 5ml in the morning and 5ml at night   Calcarb 600/D 600-400 MG-UNIT tablet Generic drug: Calcium  Carbonate-Vitamin D Take 1 tablet by mouth daily. Only gives once a week.   cetirizine HCl 1 MG/ML solution Commonly known as: ZYRTEC Take 10 mLs (10 mg total) by mouth daily for 7 days.   clotrimazole 1 % cream Commonly known as: LOTRIMIN APPLY EXTERNALLY TO THE AFFECTED AREA TWICE DAILY   Depakote Sprinkles 125 MG capsule Generic drug: divalproex TAKE 8 CAPSULES BY MOUTH TWICE DAILY   Diastat AcuDial 20 MG Gel Generic drug: diazepam DIAL TO 15 MG, GIVE RECTALLY FOR SEIZURES LASTING 2 MINUTES OR LONGER   feeding supplement (ENSURE ENLIVE) Liqd Take 237 mLs by mouth 3 (three) times daily between meals.   fluticasone 50 MCG/ACT nasal spray Commonly known as: FLONASE SHAKE LIQUID AND USE 2 SPRAYS IN EACH NOSTRIL DAILY   furosemide 20 MG tablet Commonly known as: LASIX TAKE 1 TABLET BY MOUTH DAILY AS NEEDED FOR FLUID RETENTION   hydrocortisone cream 0.5 % Apply 1 application topically 2 (two) times daily.   ibuprofen 200 MG tablet Commonly known as: ADVIL Take 400 mg by mouth as needed for moderate pain.   Keppra 100 MG/ML solution Generic drug: levETIRAcetam Take 12ml by mouth every morning and 12ml by mouth at night   lactulose 10 GM/15ML solution Commonly known as: CHRONULAC TAKE 30 MLS BY MOUTH TWICE DAILY   levOCARNitine 1 GM/10ML solution Commonly known as: CARNITOR TAKE 5 MLS BY MOUTH THREE TIMES DAILY   liver oil-zinc oxide 40 % ointment Commonly known as: DESITIN Apply 1 application topically daily.   loperamide 2 MG capsule Commonly known as: IMODIUM Take 1 capsule (2 mg total) by mouth as needed for diarrhea or loose stools.   nystatin powder Commonly known as: nystatin APPLY TOPICALLY TO AFFECTED DIAPER AREA WITH EVERY DIAPER CHANGE UNTIL RASH RESOLVES   ondansetron 4 MG disintegrating tablet Commonly known as: Zofran ODT Take 1 tablet (4 mg total) by mouth every 6 (six) hours as needed.   pantoprazole sodium 40 mg/20 mL Pack Commonly known  as: Protonix MIX 1 PACK AND TAKE DAILY AS DIRECTED   Topamax Sprinkle 25 MG capsule Generic drug: topiramate TAKE 6 CAPSULES BY MOUTH IN THE MORNING AND 6 CAPSULES BY MOUTH IN THE EVENING WITH FOOD   trimethoprim-polymyxin b ophthalmic solution Commonly known as: Polytrim Place 1 drop into both eyes every 4 (four) hours.      Total time  spent on the phone with the patient's mother was 13 minutes, of which 50% or more was spent in counseling and coordination of care.  Rockwell [place] NP-C [facility] Ph. [phone] Fax [phone]

## 2018-12-24 ENCOUNTER — Encounter: Payer: Medicare Other | Admitting: Occupational Therapy

## 2018-12-26 ENCOUNTER — Encounter (INDEPENDENT_AMBULATORY_CARE_PROVIDER_SITE_OTHER): Payer: Self-pay | Admitting: Family

## 2018-12-30 ENCOUNTER — Encounter: Payer: Medicare Other | Admitting: Occupational Therapy

## 2018-12-31 ENCOUNTER — Encounter: Payer: Medicare Other | Admitting: Occupational Therapy

## 2019-01-01 ENCOUNTER — Encounter: Payer: Medicare Other | Admitting: Occupational Therapy

## 2019-01-06 ENCOUNTER — Encounter: Payer: Medicare Other | Admitting: Occupational Therapy

## 2019-01-07 ENCOUNTER — Other Ambulatory Visit: Payer: Self-pay

## 2019-01-07 ENCOUNTER — Ambulatory Visit: Payer: Medicare Other | Attending: Family Medicine | Admitting: Occupational Therapy

## 2019-01-07 DIAGNOSIS — I890 Lymphedema, not elsewhere classified: Secondary | ICD-10-CM | POA: Diagnosis not present

## 2019-01-07 NOTE — Therapy (Signed)
[place] MAIN [facility] SERVICES [address] [place], [place], [address] Phone: [phone]   Fax:  [phone]  Occupational Therapy Treatment Note and Discharge Summary  Patient Details  Name: Michele [name] MRN: [id number] Date of Birth: 1986-03-17 No data recorded  Encounter Date: 01/07/2019  OT End of Session - 01/07/19 1005    Visit Number  5    Number of Visits  36    Date for OT Re-Evaluation  02/25/19    OT Start Time  0915    OT Stop Time  0945    OT Time Calculation (min)  30 min    Activity Tolerance  Patient tolerated treatment well;No increased pain    Behavior During Therapy  WFL for tasks assessed/performed       Past Medical History:  Diagnosis Date  . Cerebral palsy ([place])   . Hypothyroidism   . Lennox-Gastaut syndrome ([place])   . Osteoporosis   . Pneumonia   . Profound mental retardation   . Reflux   . Rickets   . Seizures ([place])     Past Surgical History:  Procedure Laterality Date  . FOOT MASS EXCISION Right   . Gastrocutaneous fistual repair  2012  . PEG TUBE PLACEMENT    . PEG TUBE REMOVAL  2012 ?    There were no vitals filed for this visit.  Subjective Assessment - 01/07/19 0920    Subjective   Michele [name] presents for OT Rx visit 5/ 36 to address BLE LE. Pt was last seen on 6/10//20 when she was measured for custom compression knee highs and HOS devices. Pt returns to clinic today with new custom garments for fitting. Mom reports garment arrived 3 weeks and Michele [name] has been wearing them daily ever since.    Patient is accompanied by:  Family member    Pertinent History  moderate, stage II, BLE lymphedema-onset unsure, CP, Hypothyroidism, Lennox-Gastaut Syndrome, hx pneumonia, Siezure disorder, reflux, osteoporosis, falls hx    Limitations  chronic, progressive, stage II BLE lymphedema and associated leg pain, difficulty walking, impaired  balance, impaired cognitive impairment,    Repetition  Increases Symptoms     Special Tests  + Stemmer sign base of toes bilaterally                   OT Treatments/Exercises (OP) - 01/07/19 0001      ADLs   ADL Education Given  Yes      Manual Therapy   Manual Therapy  Edema management    Manual therapy comments  compression garment / device fitting- replacements             OT Education - 01/07/19 1005    Education Details  Skilled LE self care training for wear and care regimes for custom elastic compression garments for full-time daily use issued and fit today. Provided training for donning and doffing garments using assistive devices (friction gloves and Tyvek sock).    Person(s) Educated  Patient;Parent(s)    Methods  Explanation;Demonstration;Handout;Tactile cues;Verbal cues    Comprehension  Verbalized understanding;Returned demonstration;Need further instruction;Verbal cues required          OT Long Term Goals - 01/07/19 1009      OT LONG TERM GOAL #1   Title  Pt to follow Lymphedema precautions and prevention strategies with maximum caregiver assistance to limit LE progression and further functional decline. (Primary caregiver modified independent w/ lymphedema precautions and prevention principals and strategies using printed  reference to limit LE progression and infection risk.)    Baseline  Dependent    Time  2    Period  Weeks    Status  Achieved      OT LONG TERM GOAL #2   Title  Lymphedema (LE) management/ self-care: Pt able to apply knee length, multi layered, gradient compression wraps, one leg at a time, with max caregiver assist x 1 using proper techniques within 2 weeks to achieve optimal limb volume reduction in preparation for fitting compression garments/ devices.    Baseline  Dependent      Time  1    Period  Weeks    Status  Achieved      OT LONG TERM GOAL #3   Title  Lymphedema (LE) management/ self-care:  Pt to achieve at least 10%  BLE limb volume reductions  during Intensive Phase CDT to limit LE  progression, to reduce infection risk, and to improve functional mobility and transfers essential for optimal ADLs performance.    Baseline  Dependent     Time  12    Period  Weeks    Status  Achieved      OT LONG TERM GOAL #4   Title  Lymphedema (LE) management/ self-care:  Pt to tolerate daily compression wraps, compression garments and/ or HOS devices in keeping w/ prescribed wear regimes within 1 week of issue date of each to progress and retain clinical and functional gains and to limit LE progression.    Baseline  dependent    Time  12    Period  Weeks    Status  Achieved      OT LONG TERM GOAL #5   Title  Lymphedema (LE) management/ self-care:  During Management Phase CDT Pt to sustain current limb volumes within 3%, and all other clinical gains achieved during OT treatment iwith maximum caregiver assistance  (to control limb swelling and associated pain/ discomfort, to  limit LE progression, to decrease infection risk and to limit further functional decline.    Baseline  dependent    Time  6    Period  Months    Status  On-going            Plan - 01/07/19 1006    Clinical Impression Statement  Completed initial fitting for new/ replacement BLE custom Elvarex compression knee highs and BLE custom Jobst RELAX for HOS. New garments fit very well and mother is able to don them correctly using assistive devices after skilled teaching. DME vendor emailed requesting shipment of 2nd pair daytime garments with confirmation of correct fit. Parent agrees to plan for DC OT today. No further services for lymphedema care are needed at present. All goals met.    Occupational Profile and client history currently impacting functional performance  dependent for lymphedema care, non-verbal    Occupational performance deficits (Please refer to evaluation for details):  ADL's;IADL's;Work;Play;Leisure;Social Participation;Rest and Sleep;Education    Rehab Potential  Good    Clinical Decision Making   Multiple treatment options, significant modification of task necessary    OT Frequency  2x / week    OT Treatment/Interventions  Self-care/ADL training;DME and/or AE instruction;Manual Therapy;Patient/family education;Manual lymph drainage;Compression bandaging;Therapeutic activities;Other (comment)   BLE skin care during MLD with low ph Eucerin lotion and Palma Christii, skin grade castor oil.   Recommended Other Services  Replace worn out existing custom BLE knee length compression stockings- 2 pr-1 to wash and one to wear; replace existing worn out  HOS device    Consulted and Agree with Plan of Care  Family member/caregiver       Patient will benefit from skilled therapeutic intervention in order to improve the following deficits and impairments:           Visit Diagnosis: 1. Lymphedema, not elsewhere classified       Problem List Patient Active Problem List   Diagnosis Date Noted  . Lennox-Gastaut syndrome ([place]) 07/06/2017  . Lymphedema 01/22/2017  . Bilateral lower extremity edema 04/21/2016  . Unsteadiness on feet   . Amenorrhea 05/19/2014  . 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 Morbid obesity ([place]) 12/24/2012  . 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 URINARY INCONTINENCE 02/25/2010  . Full incontinence of feces 02/25/2010  . Hypothyroidism 01/29/2009  . 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 Cerebral palsy ([place]) 01/29/2009  . GASTROESOPHAGEAL REFLUX DISEASE 01/29/2009  . Osteoporosis 01/29/2009  . Seizure disorder ([place]) 01/29/2009    Andrey Spearman, MS, OTR/L, [facility] 01/07/19 10:11 AM  [place] MAIN [facility] SERVICES [address] [place], [place], [address] Phone: [phone]   Fax:  [phone]  Name: Michele [name] MRN: [id number] Date of Birth: 04/03/1986

## 2019-01-08 ENCOUNTER — Encounter: Payer: Medicare Other | Admitting: Occupational Therapy

## 2019-01-09 ENCOUNTER — Encounter: Payer: Self-pay | Admitting: Family Medicine

## 2019-01-09 ENCOUNTER — Other Ambulatory Visit: Payer: Self-pay

## 2019-01-09 ENCOUNTER — Ambulatory Visit (INDEPENDENT_AMBULATORY_CARE_PROVIDER_SITE_OTHER): Payer: Medicare Other | Admitting: Family Medicine

## 2019-01-09 VITALS — BP 100/62 | HR 84 | Wt 221.0 lb

## 2019-01-09 DIAGNOSIS — R34 Anuria and oliguria: Secondary | ICD-10-CM | POA: Diagnosis not present

## 2019-01-09 NOTE — Patient Instructions (Signed)
Thank you for coming to see me today. It was a pleasure. Today we talked about:   Michele [name]'s decreased urination:  We will check her blood to make sure her kidneys are okay.  Try to keep her hydrated as much as possible.  We will let you know her lab results.  If she makes less urine, is not acting like herself, has a fever please let us know right away.  If you have any questions or concerns, please do not hesitate to call the office at [phone].  Best,   [place] Constable, DO

## 2019-01-09 NOTE — Progress Notes (Signed)
Subjective: Chief Complaint  Patient presents with  . Vaginitis     HPI: Michele [name] is a 33 y.o. presenting to clinic today to discuss the following:  Mother, Margaretha GlassingLoretta, present and providing history.  1 Yeast infection and Decreased Urination Mother notes that she thinks that she has a yeast infection after patient's aunt bathed her and used a new soap that seems to have irritated her.  Happened about 2 days ago.  Notes that she has been itching and acting as though she is irritated.  She has given her two doses of Monistat from the pharmacy.  Has been using the cream and powder.  Mom also concerned that she is having decrease in her urination and that she may be dehydrated.  Mom has to give her water in a syringe and thinks that she may be drinking less water.  She notes that she had one urination yesterday and then had one today.  No change in color, odor, or hematuria.  She has otherwise been in her usual state of health.  No fevers, no seizures.  She has not been acting like she has been in pain.      ROS noted in HPI.   Past Medical, Surgical, Social, and Family History Reviewed & Updated per EMR.   Pertinent Historical Findings include:   Social History   Tobacco Use  Smoking Status Never Smoker  Smokeless Tobacco Never Used      Objective: BP 100/62   Pulse 84   Wt 221 lb (100.2 kg)   LMP 12/16/2018 (Approximate)   SpO2 97%   BMI 44.64 kg/m  Vitals and nursing notes reviewed  Physical Exam:  General: 33 y.o. female in NAD, sitting up in wheelchair Cardio: RRR no m/r/g Lungs: CTAB, no wheezing, no rhonchi, no crackles, no IWOB on RA Abdomen: Soft, non-tender to palpation, non-distended, positive bowel sounds, no CVA tenderness Skin: warm and dry Extremities: 1+ BLE edema with compression stockings Neuro: wheelchair bound at baseline, smiles, makes some noises, but no words, follows some of mother's commands    Results for orders placed or performed  in visit on 01/09/19 (from the past 72 hour(s))  Basic Metabolic Panel     Status: None   Collection Time: 01/09/19  2:56 PM  Result Value Ref Range   Glucose 91 65 - 99 mg/dL   BUN 15 6 - 20 mg/dL   Creatinine, Ser 1.610.90 0.57 - 1.00 mg/dL   GFR calc non Af Amer 85 >59 mL/min/1.73   GFR calc Af Amer 98 >59 mL/min/1.73   BUN/Creatinine Ratio 17 9 - 23   Sodium 141 134 - 144 mmol/L   Potassium 4.3 3.5 - 5.2 mmol/L   Chloride 106 96 - 106 mmol/L   CO2 23 20 - 29 mmol/L   Calcium 9.5 8.7 - 10.2 mg/dL    Assessment/Plan:  Decreased urination Reassured that patient continues to urinate and is without fevers and otherwise acting normally.  Will obtain BMP to assess renal function and possibility of obstruction.  Discussed with mother to hydrate and if urination decreases further, to bring her back.  Would consider straight cath at that time.     PATIENT EDUCATION PROVIDED: See AVS    Diagnosis and plan along with any newly prescribed medication(s) were discussed in detail with this patient today. The patient verbalized understanding and agreed with the plan. Patient advised if symptoms worsen return to clinic or ER.   Health Maintainance:  Orders Placed This Encounter  Procedures  . Basic Metabolic Panel    No orders of the defined types were placed in this encounter.    [place] Constable, DO 01/10/2019, 8:38 AM PGY-2 [place]

## 2019-01-10 ENCOUNTER — Encounter: Payer: Self-pay | Admitting: Family Medicine

## 2019-01-10 DIAGNOSIS — R34 Anuria and oliguria: Secondary | ICD-10-CM | POA: Insufficient documentation

## 2019-01-10 LAB — BASIC METABOLIC PANEL
BUN/Creatinine Ratio: 17 (ref 9–23)
BUN: 15 mg/dL (ref 6–20)
CO2: 23 mmol/L (ref 20–29)
Calcium: 9.5 mg/dL (ref 8.7–10.2)
Chloride: 106 mmol/L (ref 96–106)
Creatinine, Ser: 0.9 mg/dL (ref 0.57–1.00)
GFR calc Af Amer: 98 mL/min/{1.73_m2} (ref >59)
GFR calc non Af Amer: 85 mL/min/{1.73_m2} (ref >59)
Glucose: 91 mg/dL (ref 65–99)
Potassium: 4.3 mmol/L (ref 3.5–5.2)
Sodium: 141 mmol/L (ref 134–144)

## 2019-01-10 NOTE — Assessment & Plan Note (Signed)
Reassured that patient continues to urinate and is without fevers and otherwise acting normally.  Will obtain BMP to assess renal function and possibility of obstruction.  Discussed with mother to hydrate and if urination decreases further, to bring her back.  Would consider straight cath at that time.

## 2019-01-13 ENCOUNTER — Encounter: Payer: Medicare Other | Admitting: Occupational Therapy

## 2019-01-15 ENCOUNTER — Encounter: Payer: Medicare Other | Admitting: Occupational Therapy

## 2019-01-17 ENCOUNTER — Other Ambulatory Visit: Payer: Self-pay | Admitting: Family Medicine

## 2019-01-20 ENCOUNTER — Encounter: Payer: Medicare Other | Admitting: Occupational Therapy

## 2019-01-22 ENCOUNTER — Other Ambulatory Visit: Payer: Self-pay

## 2019-01-22 ENCOUNTER — Encounter: Payer: Medicare Other | Admitting: Occupational Therapy

## 2019-01-22 DIAGNOSIS — R6889 Other general symptoms and signs: Secondary | ICD-10-CM | POA: Diagnosis not present

## 2019-01-22 DIAGNOSIS — Z20822 Contact with and (suspected) exposure to covid-19: Secondary | ICD-10-CM

## 2019-01-23 LAB — NOVEL CORONAVIRUS, NAA: SARS-CoV-2, NAA: NOT DETECTED

## 2019-01-27 ENCOUNTER — Encounter: Payer: Medicare Other | Admitting: Occupational Therapy

## 2019-01-29 ENCOUNTER — Encounter: Payer: Medicare Other | Admitting: Occupational Therapy

## 2019-02-05 ENCOUNTER — Encounter: Payer: Medicare Other | Admitting: Occupational Therapy

## 2019-02-10 ENCOUNTER — Encounter: Payer: Medicare Other | Admitting: Occupational Therapy

## 2019-02-12 ENCOUNTER — Encounter: Payer: Medicare Other | Admitting: Occupational Therapy

## 2019-02-17 ENCOUNTER — Encounter: Payer: Medicare Other | Admitting: Occupational Therapy

## 2019-02-19 ENCOUNTER — Encounter: Payer: Medicare Other | Admitting: Occupational Therapy

## 2019-03-03 ENCOUNTER — Other Ambulatory Visit: Payer: Self-pay

## 2019-03-03 ENCOUNTER — Ambulatory Visit (INDEPENDENT_AMBULATORY_CARE_PROVIDER_SITE_OTHER): Payer: Medicare Other | Admitting: *Deleted

## 2019-03-03 DIAGNOSIS — Z23 Encounter for immunization: Secondary | ICD-10-CM

## 2019-03-03 NOTE — Progress Notes (Signed)
Pt tolerated vaccine well. Tarena Gockley, CMA  

## 2019-03-10 ENCOUNTER — Other Ambulatory Visit: Payer: Self-pay

## 2019-03-10 DIAGNOSIS — R600 Localized edema: Secondary | ICD-10-CM

## 2019-03-10 MED ORDER — FUROSEMIDE 20 MG PO TABS: ORAL_TABLET | ORAL | 0 refills | Status: DC

## 2019-03-13 ENCOUNTER — Telehealth: Payer: Self-pay

## 2019-03-13 NOTE — Telephone Encounter (Signed)
[employer] calling to verify we received paperwork that was faxed to [place] on 9/16 for [employer]. Please call [phone] and leave a message as to if we received and faxed back the paperwork. Ottis Stain, CMA

## 2019-03-17 NOTE — Telephone Encounter (Signed)
Hi Sharry, I have called them and let them know that we faxed the paper work to them. Thank you.

## 2019-03-17 NOTE — Telephone Encounter (Signed)
Thank you!! Shari 

## 2019-03-18 ENCOUNTER — Telehealth: Payer: Self-pay | Admitting: Family Medicine

## 2019-03-18 ENCOUNTER — Other Ambulatory Visit: Payer: Self-pay

## 2019-03-18 NOTE — Telephone Encounter (Signed)
Pt's mom came into office for a refill on her  Lactulose. Please give pt's mom a call

## 2019-03-19 MED ORDER — LACTULOSE 10 GM/15ML PO SOLN: ORAL | 2 refills | Status: DC

## 2019-03-26 NOTE — Telephone Encounter (Signed)
Mom calls nurse line stating the paperwork was received by [employer], however needs to be signed by an attending. They have faxed new paper work, I put in your box. Once filled out, have an attending sign, and return back to me. Thanks! I have an extra copy in the nurse room with me as well.

## 2019-03-27 NOTE — Telephone Encounter (Signed)
Thank you. I will take a look at this today when I come for clinic

## 2019-03-28 NOTE — Telephone Encounter (Signed)
Form returned with [place] signature. I faxed to appropriate number. I made a copy for batch scanning and have one at my desk.

## 2019-04-07 ENCOUNTER — Other Ambulatory Visit: Payer: Self-pay | Admitting: Student in an Organized Health Care Education/Training Program

## 2019-04-07 DIAGNOSIS — K219 Gastro-esophageal reflux disease without esophagitis: Secondary | ICD-10-CM

## 2019-05-09 ENCOUNTER — Other Ambulatory Visit: Payer: Self-pay | Admitting: Family Medicine

## 2019-05-27 ENCOUNTER — Telehealth: Payer: Self-pay | Admitting: *Deleted

## 2019-05-27 MED ORDER — FLUTICASONE PROPIONATE 50 MCG/ACT NA SUSP: NASAL | 5 refills | Status: DC

## 2019-05-28 NOTE — Telephone Encounter (Signed)
Pharmacy calls nurse line requesting directions for recent Flonase prescription. Please advise.

## 2019-05-29 ENCOUNTER — Telehealth: Payer: Self-pay | Admitting: Family Medicine

## 2019-05-29 NOTE — Telephone Encounter (Signed)
Pt mother is calling and would like to check on the status of having a new prescription for pt's ensure. Pt mother said [facility] has sent the form twice.

## 2019-05-29 NOTE — Telephone Encounter (Signed)
Will forward to MD. Lynette Topete,CMA  

## 2019-05-30 ENCOUNTER — Other Ambulatory Visit: Payer: Self-pay | Admitting: Family Medicine

## 2019-05-30 MED ORDER — FLUTICASONE PROPIONATE 50 MCG/ACT NA SUSP: 2.0000 | Freq: Two times a day (BID) | NASAL | 5 refills | Status: DC

## 2019-05-30 MED ORDER — ENSURE ENLIVE PO LIQD: 237.0000 mL | Freq: Three times a day (TID) | ORAL | 12 refills | Status: DC

## 2019-05-30 NOTE — Telephone Encounter (Signed)
I have refilled the pt's ensure via Epic. I have not received any forms from [place].

## 2019-05-30 NOTE — Telephone Encounter (Signed)
I have refilled the flonase prescription with directions. Thanks

## 2019-06-02 ENCOUNTER — Other Ambulatory Visit: Payer: Self-pay

## 2019-06-02 ENCOUNTER — Telehealth (INDEPENDENT_AMBULATORY_CARE_PROVIDER_SITE_OTHER): Payer: Medicare Other | Admitting: Family Medicine

## 2019-06-02 DIAGNOSIS — R0989 Other specified symptoms and signs involving the circulatory and respiratory systems: Secondary | ICD-10-CM

## 2019-06-02 DIAGNOSIS — T7840XA Allergy, unspecified, initial encounter: Secondary | ICD-10-CM | POA: Diagnosis not present

## 2019-06-02 HISTORY — DX: Other specified symptoms and signs involving the circulatory and respiratory systems: R09.89

## 2019-06-02 MED ORDER — CETIRIZINE HCL 1 MG/ML PO SOLN: 10.0000 mg | Freq: Every day | ORAL | 2 refills | Status: DC

## 2019-06-02 NOTE — Assessment & Plan Note (Addendum)
The patient has been having puffy eyes, nasal congestion and "feeling unwell letter".  No shortness of breath or fevers.  Could be due to seasonal allergies, viral upper respiratory infection, or Covid.  Mom reports the patient is feeling better this morning, denies any contact with sick individuals. -Recommended mom keep monitoring the patient's respiratory status including oxygen saturations -Flonase daily to each nostril to help decrease nasal congestion -Tylenol 500 mg every 6 hours as needed for any fevers, pain -Cetirizine ordered to help control any allergies -Encouraged to keep the patient well-hydrated -Strict return precautions given, including fever unresponsive to antipyretics, inability to tolerate p.o., or shortness of breath

## 2019-06-02 NOTE — Progress Notes (Signed)
[place] Telemedicine Visit  Patient consented to have virtual visit. Method of visit: Video was attempted, but technology challenges prevented patient from using video, so visit was conducted via telephone.  Encounter participants: Patient: Michele [name] - located at [facility] [phone] Provider: Daisy Floro - located at [facility] Others (if applicable): Mom [name]  Chief Complaint: Congestion  HPI: Mom calls in today on the patient's behalf reporting "I think her allergies are acting up". Mom states that the patient's eyes have been puffy, she has sounded congested.  The patient has seemed "under the weather". Mom has been monitoring the patient, she reports she has not had any fevers and her oxygen saturations have been within normal limit.  Mom has been administering Tylenol and Flonase as needed to help with stuffy nose.  She denies any sick contacts, shortness of breath, nausea, vomiting, diarrhea, blood in stools.  She will occasionally have a small cough, but this is not unusual for the patient.  Mom reports the patient is feeling better this morning.  She tried to feed the patient breakfast this morning, but she did not want to eat breakfast so she had an Ensure instead and drink all of it.   ROS: per HPI  Pertinent PMHx: Obesity, Seizure Disorder, Cerebral Palsy, Primarily wheelchair bound but can ambulate short distances with walker, GERD, fecal incontinence, profound intellectual disability  Exam:  Respiratory: No respiratory distress  Assessment/Plan: Upper respiratory symptom The patient has been having puffy eyes, nasal congestion and "feeling unwell letter".  No shortness of breath or fevers.  Could be due to seasonal allergies, viral upper respiratory infection, or Covid.  Mom reports the patient is feeling better this morning, denies any contact with sick individuals. -Recommended mom keep monitoring the patient's respiratory status  including oxygen saturations -Flonase daily to each nostril to help decrease nasal congestion -Tylenol 500 mg every 6 hours as needed for any fevers, pain -Cetirizine ordered to help control any allergies -Encouraged to keep the patient well-hydrated -Strict return precautions given, including fever unresponsive to antipyretics, inability to tolerate p.o., or shortness of breath  Time spent during visit with patient: 11 minutes  Milus Banister, [place], PGY-2 06/02/2019 11:01 AM

## 2019-06-06 ENCOUNTER — Other Ambulatory Visit: Payer: Self-pay | Admitting: Family Medicine

## 2019-06-06 ENCOUNTER — Telehealth: Payer: Self-pay | Admitting: Family Medicine

## 2019-06-06 DIAGNOSIS — K219 Gastro-esophageal reflux disease without esophagitis: Secondary | ICD-10-CM

## 2019-06-06 NOTE — Telephone Encounter (Signed)
error 

## 2019-06-09 ENCOUNTER — Ambulatory Visit: Payer: Self-pay

## 2019-06-09 NOTE — Chronic Care Management (AMB) (Signed)
  Care Management     Care Management Outreach Note  06/09/2019 Name: Michele [name] MRN: [id number] DOB: 07/10/1985  Michele [name] is a 33 y.o. year old female who is a primary care patient of Lattie Haw, MD . The Care Management team was consulted for assistance with . Care Coordination for Medical Necesscity paperwork  [place] reached out to the mother of  Michele [name] today by phone to inform her that the medical necessity paperwork that she called and inquired about for her daughter is in the office and awaiting the physicians signature.  The physician has been notified.   Lazaro Arms RN, BSN, [facility] Care Management Coordinator [place] Phone: [phone] Office: [phone] Fax: [phone]

## 2019-06-19 ENCOUNTER — Other Ambulatory Visit: Payer: Self-pay | Admitting: Family Medicine

## 2019-06-25 ENCOUNTER — Ambulatory Visit (INDEPENDENT_AMBULATORY_CARE_PROVIDER_SITE_OTHER): Payer: Medicare Other | Admitting: Family

## 2019-06-25 ENCOUNTER — Encounter (INDEPENDENT_AMBULATORY_CARE_PROVIDER_SITE_OTHER): Payer: Self-pay | Admitting: Family

## 2019-06-25 ENCOUNTER — Other Ambulatory Visit: Payer: Self-pay

## 2019-06-25 VITALS — BP 106/62 | HR 100 | Ht 59.0 in | Wt 220.0 lb

## 2019-06-25 DIAGNOSIS — G40814 Lennox-Gastaut syndrome, intractable, without status epilepticus: Secondary | ICD-10-CM | POA: Diagnosis not present

## 2019-06-25 DIAGNOSIS — G808 Other cerebral palsy: Secondary | ICD-10-CM

## 2019-06-25 DIAGNOSIS — F73 Profound intellectual disabilities: Secondary | ICD-10-CM

## 2019-06-25 DIAGNOSIS — G40319 Generalized idiopathic epilepsy and epileptic syndromes, intractable, without status epilepticus: Secondary | ICD-10-CM

## 2019-06-25 DIAGNOSIS — R600 Localized edema: Secondary | ICD-10-CM

## 2019-06-25 MED ORDER — DIASTAT ACUDIAL 20 MG RE GEL: RECTAL | 5 refills | Status: DC

## 2019-06-25 MED ORDER — KEPPRA 100 MG/ML PO SOLN: ORAL | 5 refills | Status: DC

## 2019-06-25 MED ORDER — LEVOCARNITINE 1 GM/10ML PO SOLN: ORAL | 5 refills | Status: DC

## 2019-06-25 MED ORDER — TOPAMAX SPRINKLE 25 MG PO CPSP: ORAL_CAPSULE | ORAL | 5 refills | Status: DC

## 2019-06-25 MED ORDER — BANZEL 40 MG/ML PO SUSP: ORAL | 5 refills | Status: DC

## 2019-06-25 MED ORDER — DEPAKOTE SPRINKLES 125 MG PO CSDR: DELAYED_RELEASE_CAPSULE | ORAL | 5 refills | Status: DC

## 2019-06-25 NOTE — Progress Notes (Signed)
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 She is taking and tolerating brand Depakote, Keppra, Topamax and Banzel for her seizure disorder. [name] typically turns blue and requires blow by oxygen when seizures occur. She is completely dependent upon others for all activities of daily living. She is cared for at home by her mother.  Today's concerns: Mom reports today that [name] has had 2 brief seizures since her last visit in July. She has been compliant with medication and usually sleeps well at night. She continues to have significant edema in her legs and feet, and Mom says that she was recently fitted for new TED hose. Ziggy has been otherwise generally healthy. Mom has no other health concerns for her today other than previously mentioned.   Review of systems: Please see HPI for neurologic and other pertinent review of systems. Otherwise all other systems were reviewed and were negative.  Problem List: Patient Active Problem List   Diagnosis Date Noted  . Allergies 06/02/2019  . Upper respiratory symptom 06/02/2019  . Decreased urination 01/10/2019  . Lennox-Gastaut syndrome (HCC) 07/06/2017  . Lymphedema 01/22/2017  . Bilateral lower extremity edema 04/21/2016  . Unsteadiness on feet   . Amenorrhea 05/19/2014  . 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 Morbid obesity (HCC) 12/24/2012  . 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 URINARY INCONTINENCE 02/25/2010  . Full incontinence of feces 02/25/2010  . Hypothyroidism 01/29/2009  . Profound  intellectual disabilities 01/29/2009  . Cerebral palsy (HCC) 01/29/2009  . GASTROESOPHAGEAL REFLUX DISEASE 01/29/2009  . Osteoporosis 01/29/2009  . Seizure disorder (HCC) 01/29/2009     Past Medical History:  Diagnosis Date  . Cerebral palsy (HCC)   . Hypothyroidism   . Lennox-Gastaut syndrome (HCC)   . Osteoporosis   . Pneumonia   . Profound mental retardation   . Reflux   . Rickets   . Seizures (HCC)     Past medical history comments: See HPI  Surgical history: Past Surgical History:  Procedure Laterality Date  . FOOT MASS EXCISION Right   . Gastrocutaneous fistual repair  2012  . PEG TUBE PLACEMENT    . PEG TUBE REMOVAL  2012 ?     Family history: family history includes Cancer in her maternal aunt; Diabetes in an other family member; Heart disease in her maternal grandmother and another family member; Hypertension in her mother and paternal grandmother; Kidney disease in an other family member; Stroke in her maternal grandfather.   Social history: Social History   Socioeconomic History  . Marital status: Single    Spouse name: Not on file  . Number of children: 0  . Years of education: Not on file  . Highest education level: Not on file  Occupational History  . Occupation: Disabled   Tobacco Use  . Smoking status: Never Smoker  . Smokeless tobacco: Never Used  Substance and Sexual Activity  . Alcohol use: No  . Drug use: No  . Sexual activity: Never  Other Topics Concern  . Not on file  Social History Narrative   0 caffeine drinks    Lives with mother who helps  ADLs.   Graduated from [employer] in 2010. Now attends day program at [employer].    Enjoys playing in water, ripping paper, watching TV, music and going out.   Social Determinants of Health   Financial Resource Strain:   . Difficulty of Paying Living Expenses: Not on file  Food Insecurity:   . Worried About Charity fundraiser in the Last Year: Not on file  . Ran Out of Food in the Last Year:  Not on file  Transportation Needs:   . Lack of Transportation (Medical): Not on file  . Lack of Transportation (Non-Medical): Not on file  Physical Activity:   . Days of Exercise per Week: Not on file  . Minutes of Exercise per Session: Not on file  Stress:   . Feeling of Stress : Not on file  Social Connections:   . Frequency of Communication with Friends and Family: Not on file  . Frequency of Social Gatherings with Friends and Family: Not on file  . Attends Religious Services: Not on file  . Active Member of Clubs or Organizations: Not on file  . Attends Archivist Meetings: Not on file  . Marital Status: Not on file  Intimate Partner Violence:   . Fear of Current or Ex-Partner: Not on file  . Emotionally Abused: Not on file  . Physically Abused: Not on file  . Sexually Abused: Not on file    Past/failed meds: Carbamazepine  Allergies: Allergies  Allergen Reactions  . Carbamazepine Rash    Immunizations: Immunization History  Administered Date(s) Administered  . Influenza Split 04/14/2011, 03/22/2012  . Influenza Whole 05/26/2009, 04/04/2010  . Influenza,inj,Quad PF,6+ Mos 04/03/2013, 04/04/2014, 03/08/2015, 03/08/2016, 03/26/2017, 04/12/2018, 03/03/2019  . Tdap 01/14/2014    Diagnostics/Screenings:  Physical Exam: BP 106/62   Pulse 100   Ht 4\' 11"  (1.499 m)   Wt 220 lb (99.8 kg)   BMI 44.43 kg/m   General: well developed, well nourished obese woman, seated in wheelchair, in no evident distress; black hair, brown eyes, even handed Head: microcephalic and atraumatic. Oropharynx benign. No dysmorphic features. Neck: supple with no carotid bruits. Cardiovascular: regular rate and rhythm, no murmurs. Has edema in her lower legs and feet.  Respiratory: Clear to auscultation bilaterally Abdomen: Bowel sounds present all four quadrants, abdomen soft, non-tender, non-distended. No hepatosplenomegaly or masses palpated. Musculoskeletal: No skeletal  deformities or obvious scoliosis. Has increased tone in her legs. Skin: no rashes or neurocutaneous lesions  Neurologic Exam Mental Status: Awake and fully alert. Has no language.  Smiles responsively. Claps her hands eagerly throughout most of the visit. Tolerant of invasions in to her space.  Unable to follow commands or participate in examination.  Cranial Nerves: Fundoscopic exam - red reflex present.  Unable to fully visualize fundus.  Pupils equal briskly reactive to light.  Turns to localize faces and objects in the periphery. Turns to localize sounds in the periphery. Facial movements are symmetric. Motor: Increased tone in the legs.   Sensory: Withdrawal x 4 Coordination: Unable to adequately assess due to patient's inability to participate in examination. No dysmetria when reaching for objects. Gait and Station: Unable to stand and bear weight.  Reflexes: Diminished and symmetric. Toes neutral. No clonus  Impression: 1. Lennox Gastaut encephalopathy 2. Generalized non-convulsive and convulsive epilepsy 3. Increased tone in her legs 4. Significant developmental and intellectual delays  Recommendations for plan of care: The patient's previous [facility] were reviewed. Reniyah has neither had nor required imaging  or lab studies since the last visit other than what has been performed by her other providers. She is a 34 year old woman with Lennox-Gastaut encephalopathy, epilepsy, increased tone in her legs, developmental and intellectual delays. She is taking and tolerating brand name Depakote, Keppra, Topamax and Banzel for her seizure disorder. Tarisa is cared for at home by her mother and Mom is devoted to her care. I asked Mom to let me know if Jerilee has any seizures or if she has any concerns. I will see her back in follow up in 6 months or sooner if needed. Mom agreed with the plans made today.  The medication list was reviewed and reconciled. No changes were made in the prescribed  medications today. A complete medication list was provided to the patient.  Allergies as of 06/25/2019      Reactions   Carbamazepine Rash      Medication List       Accurate as of June 25, 2019 11:59 PM. If you have any questions, ask your nurse or doctor.        acetaminophen 160 MG/5ML suspension Commonly known as: TYLENOL Take 320 mg by mouth every 6 (six) hours as needed for mild pain.   acetaminophen 650 MG suppository Commonly known as: TYLENOL Place 650 mg rectally every 8 (eight) hours as needed for mild pain.   Banzel 40 MG/ML Susp Generic drug: Rufinamide Take 45ml in the morning and 72ml at night   Calcarb 600/D 600-400 MG-UNIT tablet Generic drug: Calcium Carbonate-Vitamin D Take 1 tablet by mouth daily. Only gives once a week.   cetirizine HCl 1 MG/ML solution Commonly known as: ZYRTEC Take 10 mLs (10 mg total) by mouth daily for 7 days.   clotrimazole 1 % cream Commonly known as: LOTRIMIN APPLY EXTERNALLY TO THE AFFECTED AREA TWICE DAILY   Depakote Sprinkles 125 MG capsule Generic drug: divalproex TAKE 8 CAPSULES BY MOUTH TWICE DAILY   Diastat AcuDial 20 MG Gel Generic drug: diazepam DIAL TO 15 MG, GIVE RECTALLY FOR SEIZURES LASTING 2 MINUTES OR LONGER   feeding supplement (ENSURE ENLIVE) Liqd Take 237 mLs by mouth 3 (three) times daily between meals.   fluticasone 50 MCG/ACT nasal spray Commonly known as: FLONASE Place 2 sprays into both nostrils 2 (two) times daily. poonam patel MD   furosemide 20 MG tablet Commonly known as: LASIX Take 1 tablet PO PRN for fluid retention   hydrocortisone cream 0.5 % Apply 1 application topically 2 (two) times daily.   ibuprofen 200 MG tablet Commonly known as: ADVIL Take 400 mg by mouth as needed for moderate pain.   Keppra 100 MG/ML solution Generic drug: levETIRAcetam Take 6ml by mouth every morning and 70ml by mouth at night   lactulose 10 GM/15ML solution Commonly known as: CHRONULAC TAKE 30  ML BY MOUTH TWICE DAILY   levOCARNitine 1 GM/10ML solution Commonly known as: CARNITOR TAKE 5 MLS BY MOUTH THREE TIMES DAILY   liver oil-zinc oxide 40 % ointment Commonly known as: DESITIN Apply 1 application topically daily.   loperamide 2 MG capsule Commonly known as: IMODIUM Take 1 capsule (2 mg total) by mouth as needed for diarrhea or loose stools.   nystatin powder Commonly known as: nystatin APPLY TO AFFECTED DAIPER AREAS WITH EVERY DIAPER CHANGE UNTIL RASH CLEARS   ondansetron 4 MG disintegrating tablet Commonly known as: Zofran ODT Take 1 tablet (4 mg total) by mouth every 6 (six) hours as needed.   pantoprazole sodium 40  mg/20 mL Pack Commonly known as: PROTONIX MIX 1 PACK AND TAKE DAILY AS DIRECTED   Topamax Sprinkle 25 MG capsule Generic drug: topiramate TAKE 6 CAPSULES BY MOUTH IN THE MORNING AND 6 CAPSULES BY MOUTH IN THE EVENING WITH FOOD   trimethoprim-polymyxin b ophthalmic solution Commonly known as: Polytrim Place 1 drop into both eyes every 4 (four) hours.       Total time spent with the patient was 30 minutes, of which 50% or more was spent in counseling and coordination of care.  Elveria Rising NP-C [facility] Ph. [phone] Fax [phone]

## 2019-06-28 ENCOUNTER — Encounter (INDEPENDENT_AMBULATORY_CARE_PROVIDER_SITE_OTHER): Payer: Self-pay | Admitting: Family

## 2019-06-28 NOTE — Patient Instructions (Signed)
Thank you for coming in today.   Instructions for you until your next appointment are as follows: 1. Continue giving Michele [name]'s medications as you have been doing 2. Let me know if she has any seizures or if you have any concerns 3. Please sign up for MyChart if you have not done so 4. Please plan to return for follow up in 6 months or sooner if needed.

## 2019-07-05 ENCOUNTER — Telehealth: Payer: Self-pay | Admitting: Family Medicine

## 2019-07-05 NOTE — Telephone Encounter (Signed)
**  After Hours/ Emergency Line Call**  Received a call from patient's mother to report that Michele [name] has had decreased urination.  This has been going on for the past 2 weeks.  Patient has been tolerating p.o. well.  Mother reports that patient is urinating 2-3 times a day, however she is giving her Lasix due to her chronic lymphedema.  Patient has no fevers, chills, nausea, vomiting, diarrhea, constipation, abdominal pain, shortness of breath.  Mom says that legs are chronically swollen and wrapped in TED hose.  Given that this is gone on for 2 weeks, and patient sounds relatively stable.  Recommend the patient come into the office on Monday to be evaluated for fluid status, but as of right now does not sound infectious.  Discussed red flag symptoms including signs of trouble breathing or infection, would need to come to the emergency department.   Will forward to PCP.      Garnette Gunner, MD PGY-3, [facility] 07/05/2019 12:02 PM

## 2019-07-07 ENCOUNTER — Other Ambulatory Visit: Payer: Self-pay

## 2019-07-07 ENCOUNTER — Ambulatory Visit (INDEPENDENT_AMBULATORY_CARE_PROVIDER_SITE_OTHER): Payer: Medicare Other | Admitting: Student in an Organized Health Care Education/Training Program

## 2019-07-07 VITALS — BP 100/62 | HR 83 | Wt 221.4 lb

## 2019-07-07 DIAGNOSIS — R601 Generalized edema: Secondary | ICD-10-CM

## 2019-07-07 NOTE — Patient Instructions (Signed)
It was a pleasure to see you today!  To summarize our discussion for this visit:  Thank you for bringing Ms. Bonnell in. I'm sorry to hear that she is having excess swelling. Her lungs sound very clear and her breathing seems to be doing well.   Today, we will check some blood work to see how her kidneys/liver/heart are doing. Based on those results, we may want to do some more treatments or testing. I'll keep you updated as the results come back.  In the meantime, I would recommend taking her lasix twice per day and limiting her salt intake  Some additional health maintenance measures we should update are: Health Maintenance Due  Topic Date Due  . PAP SMEAR-Modifier  06/05/2014  .    Please return to our clinic to see [place] in a few days.  Call the clinic at [phone] if your symptoms worsen or you have any concerns.   Thank you for allowing me to take part in your care,  Dr. Jamelle Rushing

## 2019-07-07 NOTE — Progress Notes (Signed)
   Subjective:    Patient ID: Michele [name], female    DOB: 04-26-1986, 34 y.o.   MRN: [id number]  CC: swelling and urination concerns  HPI:  [name] presents with her mother who provides history. Mother endorses that Michele [name] has had 1 week of worsening whole body swelling that is most noticeable in her arms and breasts but also endorses swelling in lower extremities and abdomen.  Patient seems otherwise well and has not noticed any signs of respiratory distress.  Patient has swelling at baseline and wears compression stockings and takes 20 mg Lasix daily with instructions from cardiologist to increase dose on a as needed basis with unclear instructions on what the need for increased dose is. Mother has been giving patient 40 mg of Lasix daily and has not seen an increase in urine output.  She has been voiding about 1-2 times per day.  Smoking status reviewed   ROS: pertinent noted in the HPI   I have personally reviewed pertinent past medical history, surgical, family, and social history as appropriate. Objective:  BP 100/62   Pulse 83   Wt 221 lb 6.4 oz (100.4 kg)   LMP 06/27/2019 (Approximate)   SpO2 96%   BMI 44.72 kg/m   Vitals and nursing note reviewed  General: NAD, pleasant, able to participate in exam Cardiac: RRR, S1 S2 present. normal heart sounds, no murmurs. Respiratory: CTAB, normal effort, No wheezes, rales or rhonchi Extremities: Nonpitting edema of lower extremities and upper extremities. Skin: warm and dry, no rashes noted Neuro: alert, no obvious focal deficits Psych: Normal affect and mood  Assessment & Plan:   Anasarca Diffuse nonpitting edema and otherwise well-appearing patient with clear lungs and good respiratory status. Unable to assess difference from baseline as I have not met this patient before. Recommended lab work to assess kidney, liver and BNP. Recommended mother administer 20 mg Lasix twice daily, limit salt intake and schedule follow-up  appointment to review labs later this week or return sooner if patient has any respiratory decline at all.  Orders Placed This Encounter  Procedures  . Basic Metabolic Panel  . Comprehensive metabolic panel    Doristine Mango, [facility] PGY-2

## 2019-07-08 LAB — COMPREHENSIVE METABOLIC PANEL
ALT: 14 IU/L (ref 0–32)
AST: 20 IU/L (ref 0–40)
Albumin/Globulin Ratio: 1.2 (ref 1.2–2.2)
Albumin: 3.6 g/dL — ABNORMAL LOW (ref 3.8–4.8)
Alkaline Phosphatase: 67 IU/L (ref 39–117)
BUN/Creatinine Ratio: 12 (ref 9–23)
BUN: 12 mg/dL (ref 6–20)
Bilirubin Total: <0.2 mg/dL (ref 0.0–1.2)
CO2: 24 mmol/L (ref 20–29)
Calcium: 9.7 mg/dL (ref 8.7–10.2)
Chloride: 107 mmol/L — ABNORMAL HIGH (ref 96–106)
Creatinine, Ser: 0.98 mg/dL (ref 0.57–1.00)
GFR calc Af Amer: 88 mL/min/{1.73_m2} (ref >59)
GFR calc non Af Amer: 76 mL/min/{1.73_m2} (ref >59)
Globulin, Total: 2.9 g/dL (ref 1.5–4.5)
Glucose: 86 mg/dL (ref 65–99)
Potassium: 3.9 mmol/L (ref 3.5–5.2)
Sodium: 143 mmol/L (ref 134–144)
Total Protein: 6.5 g/dL (ref 6.0–8.5)

## 2019-07-09 ENCOUNTER — Ambulatory Visit (INDEPENDENT_AMBULATORY_CARE_PROVIDER_SITE_OTHER): Payer: Medicare Other | Admitting: Family Medicine

## 2019-07-09 ENCOUNTER — Encounter: Payer: Self-pay | Admitting: Family Medicine

## 2019-07-09 ENCOUNTER — Other Ambulatory Visit: Payer: Self-pay

## 2019-07-09 VITALS — BP 110/74 | HR 80 | Wt 221.0 lb

## 2019-07-09 DIAGNOSIS — R600 Localized edema: Secondary | ICD-10-CM | POA: Diagnosis not present

## 2019-07-09 DIAGNOSIS — R601 Generalized edema: Secondary | ICD-10-CM | POA: Diagnosis present

## 2019-07-09 NOTE — Progress Notes (Signed)
   Subjective:    Patient ID: Michele [name], female    DOB: 09/01/1985, 34 y.o.   MRN: [id number]   CC: Michele [name] is a 34 yr old female who presents with her mother today for follow up of lab results  HPI:  Worsening edema  Pt has hx of bilateral lower extremity for the last 2 years. She was prescribed 20mg  Lasix PRN which  Mother gives occasionally when her edema has worsened. Over the past few weeks edema has worsened and mother noticed it spreading to her upper body including arms, breasts etc. Has increased Lasix to 20mg  BID which has helped a little. Pt sometimes responds to this dose and has adequate urine output but has had recent hx of decreased urine output. Pt wears diapers. She saw Dr in clinic 2 days ago who recommended CMP and follow up today. Mother reports Michele [name] was on Synthyroid a long time ago but was discontinued by a physician but unsure why. Denies chest pain, SOB, cough or fevers.   Smoking status reviewed   ROS: pertinent noted in the HPI   Past medical history, surgical, family, and social history reviewed and updated in the EMR as appropriate. Reviewed problem list.   Objective:  BP 110/74   Pulse 80   Wt 221 lb (100.2 kg)   LMP 06/27/2019 (Approximate)   BMI 44.64 kg/m   Vitals and nursing note reviewed  General: NAD, pleasant. Cardiac: RRR, S1 S2 present. normal heart sounds, no murmurs. Respiratory: chest clear, no crackles or wheeze Extremities: Non pitting edema of UE, LE and breast tissue. Non tender on palpation. Skin: warm and dry, no rashes noted Neuro: alert, no obvious focal deficits Psych: Normal affect and mood   Assessment & Plan:    Bilateral lower extremity edema Slow worsening non-pitting edema of lower extremities, involving the rest to the body with weight gain of 1lb since visit 2 days ago and top differentials are untreated thyroid disease and lymphedema. Pt was taking synthroid when she was younger but discontinued. Last  TSH 3/9 2.4. Edema is not pitting and low concern [facility] exacerbation or renal disease where edema would be pitting. CMP tested yesterday was normal. Last echo March 2020 showed EF 55-60%.  -TSH and CBC today -Urine protein creat ratio -Continue same dose of Lasix, increase dose unlikely to help with treatment of non pitting edema -Follow up with me in 1 week -I will call pt's mother of results   5/9, MD  [facility] PGY-1

## 2019-07-09 NOTE — Assessment & Plan Note (Addendum)
Slow worsening non-pitting edema of lower extremities, involving the rest to the body with weight gain of 1lb since visit 2 days ago. top differentials are untreated thyroid disease and lymphedema. Pt was taking synthroid when she was younger but discontinued, unsure why. Last TSH 3/9 2.4. Edema is not pitting and low concern CCF exacerbation or renal disease where edema would be pitting. CMP tested yesterday was normal. Last echo March 2020 showed EF 55-60%.  -TSH and CBC today -Urine protein creat ratio -Continue same dose of Lasix, increase dose unlikely to help with treatment of non pitting edema -Follow up with me in 1 week -I will call pt's mother of results

## 2019-07-09 NOTE — Assessment & Plan Note (Signed)
Diffuse nonpitting edema and otherwise well-appearing patient with clear lungs and good respiratory status. Unable to assess difference from baseline as I have not met this patient before. Recommended lab work to assess kidney, liver and BNP. Recommended mother administer 20 mg Lasix twice daily, limit salt intake and schedule follow-up appointment to review labs later this week or return sooner if patient has any respiratory decline at all.

## 2019-07-09 NOTE — Patient Instructions (Signed)
Michele [name],  It was lovely to meet you and your mom today!! I think the fluid that is collecting in your body maybe due to an underactive thyroid gland and we may need to start you back on synthroid. We have checked your thyroid function today and a complete blood count. I will be in touch with the results. Keep the lasix at the same dose for now. Please come and see me in a weeks time for a follow up.  Best wishes,  Dr Allena Katz

## 2019-07-10 LAB — CBC
Hematocrit: 38.5 % (ref 34.0–46.6)
Hemoglobin: 13.5 g/dL (ref 11.1–15.9)
MCH: 34 pg — ABNORMAL HIGH (ref 26.6–33.0)
MCHC: 35.1 g/dL (ref 31.5–35.7)
MCV: 97 fL (ref 79–97)
Platelets: 219 10*3/uL (ref 150–450)
RBC: 3.97 x10E6/uL (ref 3.77–5.28)
RDW: 13.7 % (ref 11.7–15.4)
WBC: 9.4 10*3/uL (ref 3.4–10.8)

## 2019-07-10 LAB — TSH: TSH: 2.68 u[IU]/mL (ref 0.450–4.500)

## 2019-07-11 ENCOUNTER — Other Ambulatory Visit: Payer: Self-pay | Admitting: Family Medicine

## 2019-07-11 ENCOUNTER — Telehealth: Payer: Self-pay | Admitting: Family Medicine

## 2019-07-14 ENCOUNTER — Telehealth: Payer: Self-pay | Admitting: Family Medicine

## 2019-07-14 NOTE — Telephone Encounter (Signed)
Called pt's mother to inform her of Michele [name]'s lab results. Recommended using Lasix on PRN basis for peripheral edema. Edema likely lymphedema. Recommended compression stockings and physical therapy.

## 2019-07-16 ENCOUNTER — Encounter: Payer: Self-pay | Admitting: Family Medicine

## 2019-07-16 ENCOUNTER — Other Ambulatory Visit: Payer: Self-pay

## 2019-07-16 ENCOUNTER — Ambulatory Visit (INDEPENDENT_AMBULATORY_CARE_PROVIDER_SITE_OTHER): Payer: Medicare Other | Admitting: Family Medicine

## 2019-07-16 DIAGNOSIS — I890 Lymphedema, not elsewhere classified: Secondary | ICD-10-CM | POA: Diagnosis not present

## 2019-07-16 DIAGNOSIS — Z0000 Encounter for general adult medical examination without abnormal findings: Secondary | ICD-10-CM | POA: Diagnosis not present

## 2019-07-16 NOTE — Progress Notes (Signed)
   Subjective:    Patient ID: Michele [name], female    DOB: [birth year], 34 y.o.   MRN: [id number]   CC: Michele [name] is 34 yr old female who presents today for a follow up of lymphedema   HPI:  Lymphedema  Was seen last week in clinic for worsening non pitting edema. Mother had been giving pt Lasix 20 mg PRN and using compression stockings. Checked CBC and TSH last week as there was a hx of using Synthroid but discontinued in the past by a provider. Pt is euthyroid. Pt's weight is 221lb. Over the last week mother reports edema has not worsened and is about the same. Compression stockings have help. Weight has been stable. Has brought in physiotherapy form to be filled out for referral.   Pap smear Asked mother if patient has ever had a pap smear as it said pt declined this in 2012. Mother said she has not declined and would like her to have one if she can tolerate it.  Smoking status reviewed   ROS: pertinent noted in the HPI    Past medical history, surgical, family, and social history reviewed and updated in the EMR as appropriate. Reviewed problem list.   Objective:  BP 112/80   Pulse 97   Ht 4\' 11"  (1.499 m)   Wt 221 lb (100.2 kg)   LMP 06/27/2019 (Approximate)   SpO2 97%   BMI 44.64 kg/m   Vitals and nursing note reviewed  General: NAD, pleasant, able to participate in exam Cardiac: RRR, S1 S2 present. normal heart sounds, no murmurs. Respiratory: CTAB, normal effort, No wheezes, rales or rhonchi Extremities: non pitting edema in LE, mildly improved from last week Skin: warm and dry, no rashes noted Neuro: alert, no obvious focal deficits Psych: Normal affect and mood   Assessment & Plan:    Lymphedema Lymphedema is stable likely secondary to recent weight gain and chronic comorbidities. Edema 2/2 thyroid disease ruled out as TSH normal.  -Continue use of compression stockings -Physiotherapy referral form filled out, mother will drop off form -Continue Lasix  PRN -Weight loss counseling provided to mother: introducing more vegetables -Review in 1-2 months  Health care maintenance Pt has never had a pap smear. Asked mother to book clinic visit at her convenience. We will need to book room 12 as it has a bed which we can bring down so Michele [name] can transfer to it.    [name], MD  [facility] PGY-1

## 2019-07-16 NOTE — Assessment & Plan Note (Signed)
Pt has never had a pap smear. Asked mother to book clinic visit at her convenience. We will need to book room 12 as it has a bed which we can bring down so Lara can transfer to it.

## 2019-07-16 NOTE — Assessment & Plan Note (Addendum)
Lymphedema is stable likely secondary to recent weight gain and chronic comorbidities. Edema 2/2 thyroid disease ruled out as TSH normal.  -Continue use of compression stockings -Physiotherapy referral form filled out, mother will drop off form -Continue Lasix PRN -Weight loss counseling provided to mother: introducing more vegetables -Review in 1-2 months

## 2019-07-16 NOTE — Patient Instructions (Addendum)
Hi Michele [name],  It was lovely to see you again today! Your weight is stable today which is great. Please continue to use the compression stockings. I have referred you to physiotherapy, they will give you exercises to help with the drainage of the fluid. We discussed each healthier options with your mom and not eating after a certain time in the evening. I can see you in again in a few months and we can see how your weight is doing. Losing weight will help reduce the swelling.  I look forward to seeing you again.  Best wishes,  Dr Allena Katz

## 2019-07-17 ENCOUNTER — Ambulatory Visit: Payer: Medicare Other | Admitting: Physical Therapy

## 2019-07-22 ENCOUNTER — Ambulatory Visit: Payer: Medicare Other | Admitting: Physical Therapy

## 2019-07-24 ENCOUNTER — Ambulatory Visit: Payer: Medicare Other | Admitting: Physical Therapy

## 2019-07-28 NOTE — Telephone Encounter (Signed)
erro  neous encounter

## 2019-08-01 ENCOUNTER — Other Ambulatory Visit: Payer: Self-pay

## 2019-08-01 ENCOUNTER — Emergency Department (HOSPITAL_COMMUNITY): Payer: Medicare Other

## 2019-08-01 ENCOUNTER — Encounter (HOSPITAL_COMMUNITY): Payer: Self-pay | Admitting: Emergency Medicine

## 2019-08-01 ENCOUNTER — Telehealth: Payer: Self-pay | Admitting: Family Medicine

## 2019-08-01 ENCOUNTER — Inpatient Hospital Stay (HOSPITAL_COMMUNITY)
Admission: EM | Admit: 2019-08-01 | Discharge: 2019-08-10 | DRG: 871 | Disposition: A | Discharge status: Home Health | Payer: Medicare Other | Attending: Family Medicine | Admitting: Family Medicine

## 2019-08-01 DIAGNOSIS — G808 Other cerebral palsy: Secondary | ICD-10-CM | POA: Diagnosis present

## 2019-08-01 DIAGNOSIS — J9601 Acute respiratory failure with hypoxia: Secondary | ICD-10-CM | POA: Diagnosis present

## 2019-08-01 DIAGNOSIS — J1282 Pneumonia due to coronavirus disease 2019: Secondary | ICD-10-CM | POA: Diagnosis present

## 2019-08-01 DIAGNOSIS — A4189 Other specified sepsis: Secondary | ICD-10-CM | POA: Diagnosis present

## 2019-08-01 DIAGNOSIS — E039 Hypothyroidism, unspecified: Secondary | ICD-10-CM | POA: Diagnosis present

## 2019-08-01 DIAGNOSIS — E875 Hyperkalemia: Secondary | ICD-10-CM | POA: Diagnosis not present

## 2019-08-01 DIAGNOSIS — U071 COVID-19: Secondary | ICD-10-CM

## 2019-08-01 DIAGNOSIS — E870 Hyperosmolality and hypernatremia: Secondary | ICD-10-CM | POA: Diagnosis not present

## 2019-08-01 DIAGNOSIS — R6510 Systemic inflammatory response syndrome (SIRS) of non-infectious origin without acute organ dysfunction: Secondary | ICD-10-CM

## 2019-08-01 DIAGNOSIS — G40409 Other generalized epilepsy and epileptic syndromes, not intractable, without status epilepticus: Secondary | ICD-10-CM | POA: Diagnosis present

## 2019-08-01 DIAGNOSIS — G40909 Epilepsy, unspecified, not intractable, without status epilepticus: Secondary | ICD-10-CM | POA: Diagnosis not present

## 2019-08-01 DIAGNOSIS — T380X5A Adverse effect of glucocorticoids and synthetic analogues, initial encounter: Secondary | ICD-10-CM | POA: Diagnosis present

## 2019-08-01 DIAGNOSIS — I890 Lymphedema, not elsewhere classified: Secondary | ICD-10-CM | POA: Diagnosis present

## 2019-08-01 DIAGNOSIS — G800 Spastic quadriplegic cerebral palsy: Secondary | ICD-10-CM | POA: Diagnosis not present

## 2019-08-01 DIAGNOSIS — N179 Acute kidney failure, unspecified: Secondary | ICD-10-CM | POA: Diagnosis present

## 2019-08-01 DIAGNOSIS — Z6841 Body Mass Index (BMI) 40.0 and over, adult: Secondary | ICD-10-CM

## 2019-08-01 DIAGNOSIS — E861 Hypovolemia: Secondary | ICD-10-CM | POA: Diagnosis not present

## 2019-08-01 DIAGNOSIS — B370 Candidal stomatitis: Secondary | ICD-10-CM | POA: Diagnosis present

## 2019-08-01 DIAGNOSIS — K219 Gastro-esophageal reflux disease without esophagitis: Secondary | ICD-10-CM | POA: Diagnosis present

## 2019-08-01 DIAGNOSIS — D7589 Other specified diseases of blood and blood-forming organs: Secondary | ICD-10-CM | POA: Diagnosis present

## 2019-08-01 DIAGNOSIS — R0902 Hypoxemia: Secondary | ICD-10-CM | POA: Diagnosis not present

## 2019-08-01 DIAGNOSIS — B3789 Other sites of candidiasis: Secondary | ICD-10-CM | POA: Diagnosis present

## 2019-08-01 DIAGNOSIS — F73 Profound intellectual disabilities: Secondary | ICD-10-CM | POA: Diagnosis present

## 2019-08-01 DIAGNOSIS — M810 Age-related osteoporosis without current pathological fracture: Secondary | ICD-10-CM | POA: Diagnosis present

## 2019-08-01 DIAGNOSIS — Z79899 Other long term (current) drug therapy: Secondary | ICD-10-CM

## 2019-08-01 DIAGNOSIS — G809 Cerebral palsy, unspecified: Secondary | ICD-10-CM | POA: Diagnosis not present

## 2019-08-01 DIAGNOSIS — E038 Other specified hypothyroidism: Secondary | ICD-10-CM | POA: Diagnosis not present

## 2019-08-01 DIAGNOSIS — R0602 Shortness of breath: Secondary | ICD-10-CM | POA: Diagnosis present

## 2019-08-01 LAB — CBC WITH DIFFERENTIAL/PLATELET
Abs Immature Granulocytes: 0.1 10*3/uL — ABNORMAL HIGH (ref 0.00–0.07)
Basophils Absolute: 0 10*3/uL (ref 0.0–0.1)
Basophils Relative: 0 %
Eosinophils Absolute: 0 10*3/uL (ref 0.0–0.5)
Eosinophils Relative: 0 %
HCT: 42 % (ref 36.0–46.0)
Hemoglobin: 12.7 g/dL (ref 12.0–15.0)
Immature Granulocytes: 1 %
Lymphocytes Relative: 14 %
Lymphs Abs: 1 10*3/uL (ref 0.7–4.0)
MCH: 32.6 pg (ref 26.0–34.0)
MCHC: 30.2 g/dL (ref 30.0–36.0)
MCV: 108 fL — ABNORMAL HIGH (ref 80.0–100.0)
Monocytes Absolute: 0.8 10*3/uL (ref 0.1–1.0)
Monocytes Relative: 11 %
Neutro Abs: 5.4 10*3/uL (ref 1.7–7.7)
Neutrophils Relative %: 74 %
Platelets: 181 10*3/uL (ref 150–400)
RBC: 3.89 MIL/uL (ref 3.87–5.11)
RDW: 14.7 % (ref 11.5–15.5)
WBC: 7.4 10*3/uL (ref 4.0–10.5)
nRBC: 0.4 % — ABNORMAL HIGH (ref 0.0–0.2)

## 2019-08-01 LAB — COMPREHENSIVE METABOLIC PANEL
ALT: 22 U/L (ref 0–44)
AST: 33 U/L (ref 15–41)
Albumin: 2.6 g/dL — ABNORMAL LOW (ref 3.5–5.0)
Alkaline Phosphatase: 49 U/L (ref 38–126)
Anion gap: 8 (ref 5–15)
BUN: 10 mg/dL (ref 6–20)
CO2: 23 mmol/L (ref 22–32)
Calcium: 8.5 mg/dL — ABNORMAL LOW (ref 8.9–10.3)
Chloride: 107 mmol/L (ref 98–111)
Creatinine, Ser: 0.99 mg/dL (ref 0.44–1.00)
GFR calc Af Amer: >60 mL/min (ref >60)
GFR calc non Af Amer: >60 mL/min (ref >60)
Glucose, Bld: 94 mg/dL (ref 70–99)
Potassium: 4.1 mmol/L (ref 3.5–5.1)
Sodium: 138 mmol/L (ref 135–145)
Total Bilirubin: 0.5 mg/dL (ref 0.3–1.2)
Total Protein: 6.2 g/dL — ABNORMAL LOW (ref 6.5–8.1)

## 2019-08-01 LAB — URINALYSIS, ROUTINE W REFLEX MICROSCOPIC
Bilirubin Urine: NEGATIVE
Glucose, UA: NEGATIVE mg/dL
Ketones, ur: 15 mg/dL — AB
Leukocytes,Ua: NEGATIVE
Nitrite: NEGATIVE
Protein, ur: NEGATIVE mg/dL
Specific Gravity, Urine: >1.03 — ABNORMAL HIGH (ref 1.005–1.030)
pH: 6.5 (ref 5.0–8.0)

## 2019-08-01 LAB — CBC
HCT: 43.2 % (ref 36.0–46.0)
Hemoglobin: 13.5 g/dL (ref 12.0–15.0)
MCH: 33.4 pg (ref 26.0–34.0)
MCHC: 31.3 g/dL (ref 30.0–36.0)
MCV: 106.9 fL — ABNORMAL HIGH (ref 80.0–100.0)
Platelets: 160 10*3/uL (ref 150–400)
RBC: 4.04 MIL/uL (ref 3.87–5.11)
RDW: 14.6 % (ref 11.5–15.5)
WBC: 7.1 10*3/uL (ref 4.0–10.5)
nRBC: 0.3 % — ABNORMAL HIGH (ref 0.0–0.2)

## 2019-08-01 LAB — CREATININE, SERUM
Creatinine, Ser: 1 mg/dL (ref 0.44–1.00)
GFR calc Af Amer: >60 mL/min (ref >60)
GFR calc non Af Amer: >60 mL/min (ref >60)

## 2019-08-01 LAB — URINALYSIS, MICROSCOPIC (REFLEX)

## 2019-08-01 LAB — I-STAT BETA HCG BLOOD, ED (MC, WL, AP ONLY): I-stat hCG, quantitative: <5 m[IU]/mL (ref <5)

## 2019-08-01 LAB — RESPIRATORY PANEL BY RT PCR (FLU A&B, COVID)
Influenza A by PCR: NEGATIVE
Influenza B by PCR: NEGATIVE
SARS Coronavirus 2 by RT PCR: POSITIVE — AB

## 2019-08-01 LAB — APTT: aPTT: 30 seconds (ref 24–36)

## 2019-08-01 LAB — LACTIC ACID, PLASMA
Lactic Acid, Venous: 1 mmol/L (ref 0.5–1.9)
Lactic Acid, Venous: 0.9 mmol/L (ref 0.5–1.9)

## 2019-08-01 LAB — PROCALCITONIN: Procalcitonin: <0.1 ng/mL

## 2019-08-01 LAB — D-DIMER, QUANTITATIVE: D-Dimer, Quant: 0.51 ug/mL-FEU — ABNORMAL HIGH (ref 0.00–0.50)

## 2019-08-01 LAB — ABO/RH: ABO/RH(D): O POS

## 2019-08-01 LAB — FERRITIN: Ferritin: 61 ng/mL (ref 11–307)

## 2019-08-01 LAB — FIBRINOGEN: Fibrinogen: 373 mg/dL (ref 210–475)

## 2019-08-01 LAB — LACTATE DEHYDROGENASE: LDH: 229 U/L — ABNORMAL HIGH (ref 98–192)

## 2019-08-01 LAB — HIV ANTIBODY (ROUTINE TESTING W REFLEX): HIV Screen 4th Generation wRfx: NONREACTIVE

## 2019-08-01 LAB — PROTIME-INR
INR: 1 (ref 0.8–1.2)
Prothrombin Time: 13.1 seconds (ref 11.4–15.2)

## 2019-08-01 LAB — TRIGLYCERIDES: Triglycerides: 118 mg/dL (ref <150)

## 2019-08-01 LAB — C-REACTIVE PROTEIN: CRP: 4 mg/dL — ABNORMAL HIGH (ref <1.0)

## 2019-08-01 MED ORDER — LEVETIRACETAM 100 MG/ML PO SOLN
1200.0000 mg | Freq: Two times a day (BID) | ORAL | Status: DC
  Administered 2019-08-01: 1200 mg via ORAL

## 2019-08-01 MED ORDER — LEVETIRACETAM 100 MG/ML PO SOLN
1200.0000 mg | Freq: Two times a day (BID) | ORAL | Status: DC
  Administered 2019-08-01 – 2019-08-10 (×18): 1200 mg via ORAL
  Filled 2019-08-01 (×23): qty 15

## 2019-08-01 MED ORDER — DIVALPROEX SODIUM 125 MG PO CSDR
1000.0000 mg | DELAYED_RELEASE_CAPSULE | Freq: Two times a day (BID) | ORAL | Status: DC
  Administered 2019-08-01 – 2019-08-10 (×18): 1000 mg via ORAL
  Filled 2019-08-01 (×19): qty 8

## 2019-08-01 MED ORDER — FUROSEMIDE 20 MG PO TABS: 20.0000 mg | ORAL_TABLET | Freq: Every day | ORAL | Status: DC

## 2019-08-01 MED ORDER — ACETAMINOPHEN 325 MG PO TABS
650.0000 mg | ORAL_TABLET | Freq: Four times a day (QID) | ORAL | Status: DC | PRN
  Administered 2019-08-03 – 2019-08-07 (×3): 650 mg via ORAL
  Filled 2019-08-01 (×3): qty 2

## 2019-08-01 MED ORDER — ENOXAPARIN SODIUM 40 MG/0.4ML ~~LOC~~ SOLN
40.0000 mg | Freq: Every day | SUBCUTANEOUS | Status: DC
  Administered 2019-08-01 – 2019-08-02 (×2): 40 mg via SUBCUTANEOUS
  Filled 2019-08-01 (×2): qty 0.4

## 2019-08-01 MED ORDER — DEXAMETHASONE SODIUM PHOSPHATE 10 MG/ML IJ SOLN
6.0000 mg | INTRAMUSCULAR | Status: DC
  Administered 2019-08-02 – 2019-08-10 (×9): 6 mg via INTRAVENOUS
  Filled 2019-08-01 (×9): qty 1

## 2019-08-01 MED ORDER — SODIUM CHLORIDE 0.9 % IV SOLN
2.0000 g | Freq: Once | INTRAVENOUS | Status: AC
  Administered 2019-08-01: 2 g via INTRAVENOUS
  Filled 2019-08-01: qty 2

## 2019-08-01 MED ORDER — RUFINAMIDE 40 MG/ML PO SUSP
200.0000 mg | Freq: Two times a day (BID) | ORAL | Status: DC
  Administered 2019-08-01: 08:00:00 200 mg via ORAL
  Filled 2019-08-01: qty 5

## 2019-08-01 MED ORDER — PANTOPRAZOLE SODIUM 40 MG PO PACK
40.0000 mg | PACK | Freq: Every day | ORAL | Status: DC
  Administered 2019-08-01 – 2019-08-10 (×10): 40 mg via ORAL
  Filled 2019-08-01 (×9): qty 20

## 2019-08-01 MED ORDER — RUFINAMIDE 40 MG/ML PO SUSP
200.0000 mg | Freq: Two times a day (BID) | ORAL | Status: DC
  Filled 2019-08-01: qty 5

## 2019-08-01 MED ORDER — DEXAMETHASONE SODIUM PHOSPHATE 10 MG/ML IJ SOLN
6.0000 mg | Freq: Once | INTRAMUSCULAR | Status: DC
  Administered 2019-08-01: 07:00:00 6 mg via INTRAVENOUS
  Filled 2019-08-01: qty 1

## 2019-08-01 MED ORDER — VANCOMYCIN HCL IN DEXTROSE 1-5 GM/200ML-% IV SOLN
1000.0000 mg | Freq: Once | INTRAVENOUS | Status: AC
  Administered 2019-08-01: 08:00:00 1000 mg via INTRAVENOUS
  Filled 2019-08-01: qty 200

## 2019-08-01 MED ORDER — SODIUM CHLORIDE 0.9 % IV SOLN: Freq: Once | INTRAVENOUS | Status: AC

## 2019-08-01 MED ORDER — DIVALPROEX SODIUM 125 MG PO CSDR
1000.0000 mg | DELAYED_RELEASE_CAPSULE | Freq: Two times a day (BID) | ORAL | Status: DC
  Administered 2019-08-01: 08:00:00 1000 mg via ORAL
  Filled 2019-08-01: qty 8

## 2019-08-01 MED ORDER — LACTULOSE 10 GM/15ML PO SOLN
20.0000 g | Freq: Two times a day (BID) | ORAL | Status: DC
  Administered 2019-08-01 – 2019-08-10 (×17): 20 g via ORAL
  Filled 2019-08-01 (×18): qty 30

## 2019-08-01 MED ORDER — TOPIRAMATE 25 MG PO CPSP
150.0000 mg | ORAL_CAPSULE | Freq: Two times a day (BID) | ORAL | Status: DC
  Administered 2019-08-01 – 2019-08-10 (×18): 150 mg via ORAL
  Filled 2019-08-01 (×19): qty 6

## 2019-08-01 MED ORDER — TOPIRAMATE 25 MG PO CPSP
150.0000 mg | ORAL_CAPSULE | Freq: Two times a day (BID) | ORAL | Status: DC
  Administered 2019-08-01: 150 mg via ORAL
  Filled 2019-08-01: qty 6

## 2019-08-01 MED ORDER — SODIUM CHLORIDE 0.9 % IV SOLN: 2.0000 g | Freq: Three times a day (TID) | INTRAVENOUS | Status: DC

## 2019-08-01 MED ORDER — ENSURE ENLIVE PO LIQD
237.0000 mL | Freq: Three times a day (TID) | ORAL | Status: DC
  Administered 2019-08-01 – 2019-08-07 (×7): 237 mL via ORAL

## 2019-08-01 MED ORDER — VANCOMYCIN HCL IN DEXTROSE 1-5 GM/200ML-% IV SOLN: 1000.0000 mg | Freq: Two times a day (BID) | INTRAVENOUS | Status: DC

## 2019-08-01 MED ORDER — RUFINAMIDE 40 MG/ML PO SUSP
200.0000 mg | Freq: Two times a day (BID) | ORAL | Status: DC
  Administered 2019-08-01 – 2019-08-10 (×18): 200 mg via ORAL
  Filled 2019-08-01 (×18): qty 5

## 2019-08-01 MED ORDER — METRONIDAZOLE IN NACL 5-0.79 MG/ML-% IV SOLN
500.0000 mg | Freq: Once | INTRAVENOUS | Status: AC
  Administered 2019-08-01: 500 mg via INTRAVENOUS
  Filled 2019-08-01: qty 100

## 2019-08-01 MED ORDER — SODIUM CHLORIDE 0.9 % IV SOLN
200.0000 mg | Freq: Once | INTRAVENOUS | Status: AC
  Administered 2019-08-01: 200 mg via INTRAVENOUS
  Filled 2019-08-01: qty 40

## 2019-08-01 MED ORDER — LEVOCARNITINE 1 GM/10ML PO SOLN
500.0000 mg | Freq: Three times a day (TID) | ORAL | Status: DC
  Administered 2019-08-01 – 2019-08-10 (×29): 500 mg via ORAL
  Filled 2019-08-01 (×32): qty 5

## 2019-08-01 MED ORDER — SODIUM CHLORIDE 0.9 % IV SOLN
100.0000 mg | Freq: Every day | INTRAVENOUS | Status: AC
  Administered 2019-08-02 – 2019-08-05 (×4): 100 mg via INTRAVENOUS
  Filled 2019-08-01 (×4): qty 20

## 2019-08-01 NOTE — ED Notes (Signed)
Per Nurse manager 5 W they cannot take pt and her mother

## 2019-08-01 NOTE — ED Notes (Signed)
100.0 rectal

## 2019-08-01 NOTE — Progress Notes (Signed)
Pharmacy Antibiotic Note  JOURDYN HASLER is a 34 y.o. female admitted on 08/01/2019 with sepsis.  Pharmacy has been consulted for Vancomycin/Cefepime dosing. WBC WNL. Renal function OK. Febrile.   Plan: Vancomycin 1000 mg IV q12h >>Estimated AUC: 539 Cefepime 2g IV q8h Trend WBC, temp, renal function  F/U infectious work-up Drug levels as indicated   Height: 4\' 11"  (149.9 cm) Weight: 222 lb (100.7 kg) IBW/kg (Calculated) : 43.2  Temp (24hrs), Avg:99.6 F (37.6 C), Min:99.2 F (37.3 C), Max:100 F (37.8 C)  Recent Labs  Lab 08/01/19 0551  WBC 7.4  CREATININE 0.99  LATICACIDVEN 1.0    Estimated Creatinine Clearance: 84.5 mL/min (by C-G formula based on SCr of 0.99 mg/dL).    Allergies  Allergen Reactions  . Carbamazepine Rash   09/29/19, PharmD, BCPS Clinical Pharmacist Phone: [phone]

## 2019-08-01 NOTE — Progress Notes (Signed)
Pt arrived to 5W23 with mother. Mother to stay in rm with pt and provide necessary ADL care d/t pt's status. Mother has been informed of necessity to stay in rm at all times. Pt comfortable. VS stable. Oriented to room.

## 2019-08-01 NOTE — Telephone Encounter (Signed)
Noted and agreed.

## 2019-08-01 NOTE — ED Notes (Signed)
Per MD-- has already received Decadron for today-- DO NOT GIVE A SECOND DOSE today

## 2019-08-01 NOTE — ED Provider Notes (Signed)
[facility] EMERGENCY DEPARTMENT Provider Note   CSN: [id number] Arrival date & time: 08/01/19  0459     History Chief Complaint  Patient presents with  . Fever    Michele [name] is a 34 y.o. female.  The history is provided by a parent.  Fever Max temp prior to arrival:  101 Temp source:  Oral Severity:  Moderate Onset quality:  Gradual Duration:  2 days Timing:  Constant Progression:  Waxing and waning Chronicity:  New Relieved by:  Nothing Worsened by:  Nothing Ineffective treatments:  None tried Associated symptoms: cough   Associated symptoms: no congestion, no rhinorrhea and no vomiting   Risk factors comment:  Mom works in a nursing home with a covid outbreak       Past Medical History:  Diagnosis Date  . Cerebral palsy ([place])   . Hypothyroidism   . 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 Osteoporosis   . Pneumonia   . Profound mental retardation   . Reflux   . Rickets   . Seizures [facility])     Patient Active Problem List   Diagnosis Date Noted  . Anasarca 07/09/2019  . Allergies 06/02/2019  . Upper respiratory symptom 06/02/2019  . Decreased urination 01/10/2019  . 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 Lymphedema 01/22/2017  . Bilateral lower extremity edema 04/21/2016  . Unsteadiness on feet   . Health care maintenance 07/20/2014  . Amenorrhea 05/19/2014  . 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 Morbid obesity ([place]) 12/24/2012  . 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 URINARY INCONTINENCE 02/25/2010  . Full incontinence of feces 02/25/2010  . Hypothyroidism 01/29/2009  . 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 Cerebral palsy ([place]) 01/29/2009  . GASTROESOPHAGEAL REFLUX DISEASE 01/29/2009  . Osteoporosis 01/29/2009  . Seizure disorder ([place]) 01/29/2009    Past Surgical History:  Procedure Laterality Date  . FOOT MASS EXCISION Right   . Gastrocutaneous fistual repair   2012  . PEG TUBE PLACEMENT    . PEG TUBE REMOVAL  2012 ?     OB History   No obstetric history on file.     Family History  Problem Relation Age of Onset  . Hypertension Mother   . Heart disease Maternal Grandmother        Died at 27  . Stroke Maternal Grandfather        died at 36  . Hypertension Paternal Grandmother   . Diabetes Other        MGGM  . Heart disease Other        MGGF  . Kidney disease Other        Maternal Great Aunt   . Cancer Maternal Aunt        lung  . Colon cancer Neg Hx     Social History   Tobacco Use  . Smoking status: Never Smoker  . Smokeless tobacco: Never Used  Substance Use Topics  . Alcohol use: No  . Drug use: No    Home Medications Prior to Admission medications   Medication Sig Start Date End Date Taking? Authorizing Provider  acetaminophen (TYLENOL) 160 MG/5ML suspension Take 320 mg by mouth every 6 (six) hours as needed for mild pain.    Yes [provider]  acetaminophen (TYLENOL) 500 MG tablet Take 500 mg by mouth every 6 (six) hours as needed for fever.   Yes [provider]  acetaminophen (TYLENOL) 650 MG suppository Place 650 mg rectally  every 8 (eight) hours as needed for mild pain.   Yes [provider]  BANZEL 40 MG/ML SUSP Take 10ml in the morning and 20ml at night Patient taking differently: Take 200 mg by mouth in the morning and at bedtime. Take 26ml in the morning and 35ml at night 06/25/19  Yes Goodpasture, [name], NP  Calcium Carbonate-Vitamin D (CALCARB 600/D) 600-400 MG-UNIT tablet Take 1 tablet by mouth daily. Only gives once a week.   Yes [provider]  DEPAKOTE SPRINKLES 125 MG capsule TAKE 8 CAPSULES BY MOUTH TWICE DAILY Patient taking differently: Take 1,000 mg by mouth 2 (two) times daily. TAKE 8 CAPSULES BY MOUTH TWICE DAILY 06/25/19  Yes Goodpasture, [name], NP  DIASTAT ACUDIAL 20 MG GEL DIAL TO 15 MG, GIVE RECTALLY FOR SEIZURES LASTING 2 MINUTES OR LONGER 06/25/19  Yes Elveria Rising, NP  feeding supplement, ENSURE ENLIVE, (ENSURE ENLIVE) LIQD Take 237 mLs by mouth 3 (three) times daily between meals. 05/30/19  Yes Towanda Octave, MD  fluticasone (FLONASE) 50 MCG/ACT nasal spray Place 2 sprays into both nostrils 2 (two) times daily. poonam patel MD Patient taking differently: Place 2 sprays into both nostrils daily. poonam patel MD 05/30/19  Yes Towanda Octave, MD  furosemide (LASIX) 20 MG tablet Take 1 tablet PO PRN for fluid retention Patient taking differently: Take 20 mg by mouth daily.  03/10/19  Yes Towanda Octave, MD  hydrocortisone cream 0.5 % Apply 1 application topically 2 (two) times daily. Patient taking differently: Apply 1 application topically 2 (two) times daily as needed (irritation).  10/06/15  Yes Gottschalk, Kathie Rhodes M, DO  ibuprofen (ADVIL,MOTRIN) 200 MG tablet Take 400 mg by mouth as needed for moderate pain.   Yes [provider]  KEPPRA 100 MG/ML solution Take 68ml by mouth every morning and 51ml by mouth at night 06/25/19  Yes Goodpasture, [name], NP  lactulose (CHRONULAC) 10 GM/15ML solution TAKE 30 ML BY MOUTH TWICE DAILY Patient taking differently: Take 20 g by mouth 2 (two) times daily. TAKE 30 ML BY MOUTH TWICE DAILY 03/19/19  Yes Towanda Octave, MD  levOCARNitine (CARNITOR) 1 GM/10ML solution TAKE 5 MLS BY MOUTH THREE TIMES DAILY Patient taking differently: Take 500 mg by mouth 3 (three) times daily. TAKE 5 MLS BY MOUTH THREE TIMES DAILY 06/25/19  Yes Elveria Rising, NP  liver oil-zinc oxide (DESITIN) 40 % ointment Apply 1 application topically daily.   Yes [provider]  nystatin (NYSTATIN) powder APPLY TO AFFECTED DAIPER AREAS WITH EVERY DIAPER CHANGE UNTIL RASH CLEARS 05/09/19  Yes Nicki Gu[place], MD  pantoprazole sodium (PROTONIX) 40 mg/20 mL PACK MIX 1 PACK AND TAKE DAILY AS DIRECTED Patient taking differently: Take 40 mg by mouth daily. MIX 1 PACK AND TAKE DAILY AS DIRECTED 06/06/19  Yes Towanda Octave, MD  TOPAMAX SPRINKLE 25  MG capsule TAKE 6 CAPSULES BY MOUTH IN THE MORNING AND 6 CAPSULES BY MOUTH IN THE EVENING WITH FOOD Patient taking differently: Take 150 mg by mouth 2 (two) times daily. TAKE 6 CAPSULES BY MOUTH IN THE MORNING AND 6 CAPSULES BY MOUTH IN THE EVENING WITH FOOD 06/25/19  Yes Elveria Rising, NP  clotrimazole (LOTRIMIN) 1 % cream APPLY EXTERNALLY TO THE AFFECTED AREA TWICE DAILY Patient not taking: Reported on 08/01/2019 06/25/19   Towanda Octave, MD  loperamide (IMODIUM) 2 MG capsule Take 1 capsule (2 mg total) by mouth as needed for diarrhea or loose stools. Patient not taking: Reported on 06/25/2019 11/12/17   Freddrick March, MD  ondansetron (ZOFRAN ODT) 4 MG disintegrating tablet Take 1 tablet (4 mg total) by mouth every 6 (six) hours as needed. Patient not taking: Reported on 12/23/2018 07/23/18   Ward, Layla Maw, DO    Allergies    Carbamazepine  Review of Systems   Review of Systems  Unable to perform ROS: Acuity of condition  Constitutional: Positive for fever.  HENT: Negative for congestion and rhinorrhea.   Respiratory: Positive for cough.   Gastrointestinal: Negative for vomiting.    Physical Exam Updated Vital Signs BP 112/65   Pulse 97   Temp 100 F (37.8 C) (Rectal)   Resp (!) 23   LMP 07/21/2019   SpO2 100%   Physical Exam Vitals and nursing note reviewed.  Constitutional:      Appearance: Normal appearance. She is not diaphoretic.  HENT:     Head: Normocephalic and atraumatic.     Nose: Nose normal.  Eyes:     Conjunctiva/sclera: Conjunctivae normal.     Pupils: Pupils are equal, round, and reactive to light.  Cardiovascular:     Rate and Rhythm: Normal rate and regular rhythm.     Pulses: Normal pulses.     Heart sounds: Normal heart sounds.  Pulmonary:     Effort: No respiratory distress.     Breath sounds: No stridor. No wheezing, rhonchi or rales.  Abdominal:     General: Abdomen is flat. Bowel sounds are normal.     Tenderness: There is no abdominal tenderness.  There is no guarding or rebound.  Musculoskeletal:     Cervical back: Normal range of motion and neck supple.     Right lower leg: Edema present.     Left lower leg: Edema present.  Skin:    General: Skin is warm and dry.     Capillary Refill: Capillary refill takes less than 2 seconds.  Neurological:     GCS: GCS eye subscore is 4. GCS verbal subscore is 1. GCS motor subscore is 5.     ED Results / Procedures / Treatments   Labs (all labs ordered are listed, but only abnormal results are displayed) Results for orders placed or performed during the hospital encounter of 08/01/19  Comprehensive metabolic panel  Result Value Ref Range   Sodium 138 135 - 145 mmol/L   Potassium 4.1 3.5 - 5.1 mmol/L   Chloride 107 98 - 111 mmol/L   CO2 23 22 - 32 mmol/L   Glucose, Bld 94 70 - 99 mg/dL   BUN 10 6 - 20 mg/dL   Creatinine, Ser 5.97 0.44 - 1.00 mg/dL   Calcium 8.5 (L) 8.9 - 10.3 mg/dL   Total Protein 6.2 (L) 6.5 - 8.1 g/dL   Albumin 2.6 (L) 3.5 - 5.0 g/dL   AST 33 15 - 41 U/L   ALT 22 0 - 44 U/L   Alkaline Phosphatase 49 38 - 126 U/L   Total Bilirubin 0.5 0.3 - 1.2 mg/dL   GFR calc non Af Amer >60 >60 mL/min   GFR calc Af Amer >60 >60 mL/min   Anion gap 8 5 - 15  Lactic acid, plasma  Result Value Ref Range   Lactic Acid, Venous 1.0 0.5 - 1.9 mmol/L  CBC with Differential  Result Value Ref Range   WBC 7.4 4.0 - 10.5 K/uL   RBC 3.89 3.87 - 5.11 MIL/uL   Hemoglobin 12.7 12.0 - 15.0 g/dL   HCT 41.6 38.4 - 53.6 %   MCV 108.0 (H)  80.0 - 100.0 fL   MCH 32.6 26.0 - 34.0 pg   MCHC 30.2 30.0 - 36.0 g/dL   RDW 16.1 09.6 - 04.5 %   Platelets 181 150 - 400 K/uL   nRBC 0.4 (H) 0.0 - 0.2 %   Neutrophils Relative % 74 %   Neutro Abs 5.4 1.7 - 7.7 K/uL   Lymphocytes Relative 14 %   Lymphs Abs 1.0 0.7 - 4.0 K/uL   Monocytes Relative 11 %   Monocytes Absolute 0.8 0.1 - 1.0 K/uL   Eosinophils Relative 0 %   Eosinophils Absolute 0.0 0.0 - 0.5 K/uL   Basophils Relative 0 %   Basophils  Absolute 0.0 0.0 - 0.1 K/uL   Immature Granulocytes 1 %   Abs Immature Granulocytes 0.10 (H) 0.00 - 0.07 K/uL  I-Stat beta hCG blood, ED  Result Value Ref Range   I-stat hCG, quantitative <5.0 <5 mIU/mL   Comment 3           DG Chest Portable 1 View  Result Date: 08/01/2019 CLINICAL DATA:  Cough and fever EXAM: PORTABLE CHEST 1 VIEW COMPARISON:  07/23/2018 FINDINGS: Low volume chest with patchy bilateral pulmonary infiltrate. Normal heart size. No visible effusion or edema. IMPRESSION: Patchy bilateral pneumonia. Electronically Signed   By: Marnee Spring M.D.   On: 08/01/2019 06:14      EKG  Date: 08/01/2019  Rate: 96  Rhythm: normal sinus rhythm  QRS Axis: normal  Intervals: borderline short PR  ST/T Wave abnormalities: normal  Conduction Disutrbances: none  Narrative Interpretation: borderline short PR      Procedures Procedures (including critical care time)  Medications Ordered in ED Medications  ceFEPIme (MAXIPIME) 2 g in sodium chloride 0.9 % 100 mL IVPB (has no administration in time range)  metroNIDAZOLE (FLAGYL) IVPB 500 mg (has no administration in time range)  vancomycin (VANCOCIN) IVPB 1000 mg/200 mL premix (has no administration in time range)  dexamethasone (DECADRON) injection 6 mg (has no administration in time range)    ED Course  I have reviewed the triage vital signs and the nursing notes.  Pertinent labs & imaging results that were available during my care of the patient were reviewed by me and considered in my medical decision making (see chart for details).    History, exam and CXR are consistent with covid.  I suspect the patient has a UTI as well.      IVF held as patient already has anasarca and the patient's history and exam are consistent with covid and IVF will worsen her pulmonary status precipitously.    MDM Reviewed: previous chart, nursing note and vitals Reviewed previous: labs Interpretation: labs, ECG and x-ray (CXR covid by  me, normal white count lactate and electrolytes ) Total time providing critical care: 30-74 minutes (multiple antibiotics given along with steroids ). This excludes time spent performing separately reportable procedures and services. Consults: admitting MD  CRITICAL CARE Performed by: Fareed Fung K Daysia Vandenboom-Rasch Total critical care time: 60 minutes Critical care time was exclusive of separately billable procedures and treating other patients. Critical care was necessary to treat or prevent imminent or life-threatening deterioration. Critical care was time spent personally by me on the following activities: development of treatment plan with patient and/or surrogate as well as nursing, discussions with consultants, evaluation of patient's response to treatment, examination of patient, obtaining history from patient or surrogate, ordering and performing treatments and interventions, ordering and review of laboratory studies, ordering and review of radiographic studies,  pulse oximetry and re-evaluation of patient's condition.  Final Clinical Impression(s) / ED Diagnoses Final diagnoses:  Hypoxia  Cough  SIRS (systemic inflammatory response syndrome) (HCC)   Admit to family medicine.     Ardean Simonich, MD 08/01/19 [phone]

## 2019-08-01 NOTE — Telephone Encounter (Signed)
Received after hours emergency page from mother (primary care taker) that Patient had temp 101.4 and increased swelling about baseline.  This is a complicated patient with lymphedema, CP, incontinence, obesity, and intellectual disabilities.  Mom does not think respirations are different than baseline but reports O2 sats are 89% which reports as not unusual for her daughter.  She thinks urination has been decreased lately despite reported good response to BID lasix 20 prn (which was given twice yesterday).  No known sick contacts  Given the complexity of this patient along with fever and reported O2 sats, I explained she needs to be seen today by a physician.  As we don't have clinic appts available today at the [facility], we discussed pros/cons of urgent care vs ED and mom elects to come to ED for evaluation.  Mom will need to be allowed back to ED room with patient as guardian/primary care giver  -Dr. Parke Simmers

## 2019-08-01 NOTE — Discharge Summary (Addendum)
Family Medicine Teaching [facility] Discharge Summary  Patient name: Michele [name] Medical record number: [id number] Date of birth: 13-Aug-1985 Age: 34 y.o. Gender: female Date of Admission: 08/01/2019  Date of Discharge: 08/10/2019 Admitting Physician: Marthenia Rolling, DO  Primary Care Provider: Towanda Octave, MD Consultants: None  Indication for Hospitalization: Sepsis criteria with bilateral pneumonia secondary to Covid  Discharge Diagnoses/Problem List:  Active Problems:   Hypothyroidism   Cerebral palsy (HCC)   Seizure disorder (HCC)   Hypernatremia   Pneumonia due to COVID-19 virus   AKI (acute kidney injury) (HCC)   Acute hypoxemic respiratory failure due to COVID-19 [facility])   COVID-19 Cerebral Palsy COVID-19 Infection with bilateral pneumonia Macrocytosis without anemia Subclinical hypothryoidism  Disposition: To home  Discharge Condition: Stable and improved  Discharge Exam:  BP 97/66 (BP Location: Right Arm)   Pulse 79   Temp 98 F (36.7 C) (Axillary)   Resp (!) 24   Ht 4\' 11"  (1.499 m)   Wt 93.6 kg   LMP 07/21/2019   SpO2 92%   BMI 41.68 kg/m  General: Resting comfortably, NAD HEENT: NCAT, MMM Cardiac: RRR no m/g/r Lungs: Clear anteriorly with diminished bibasilarly, satting appropriately on 1.5L nasal cannula Abdomen: soft Msk: Moves all extremities spontaneously  Ext: Warm, dry, 1+ pitting pedal edema at baseline  Brief Hospital Course:  COVID-19: Patient presented with several days of fever, found to be COVID-19 positive with bilateral pneumonia. Patient received tocilizumab x1 dose on her 14th.  She received the full remdesivir infusion course, February 12 through February 16.  She received 10 days of Decadron, February 12 through February 21.  Her initial oxygen requirement in hospital to 4 L via nasal cannula, however her oxygen requirement was 2 L nasal cannula on day of discharge.  She was sent home with home oxygen. Had mildly elevated  transaminitis, AST in 50s. Please follow as outpatient for resolution. Tocilizumab and remdesivir can cause transaminitis.  Oral candidiasis: Patient had white exudate on tongue, mother noticed decreased oral intake.  Thought most likely to be due to Decadron.  Patient was treated with nystatin for duration of Decadron treatment.  She has normal p.o. intake at discharge, no signs of thrush.   Patient had normal MCV at 97 on July 09, 2019.  However since admission her MCV has been elevated to 105-112.  No noted anemia, hemoglobin was within normal limits at 12-13.  Vitamin B12 was within normal limits.  TSH elevated to 5.118, free T4 was within normal limits.  Patient has history of hypothyroidism, and previously was treated with Synthroid.  She is no longer on Synthroid for several years.  Differential is reticulocytosis in the setting of acute illness vs subclinical hypothyroidism vs euthyroid sick syndrome.  Will follow thyroid levels and MCV as an outpatient.  Issues for Follow Up:  1.  Monitor respiratory status.  Sent home with 1-2 L of oxygen at all times, please wean this down as appropriate. 1. Lymphedema: Patient has history of lymphedema. Total free water consumption down during this admission and hypernatremia, thus home lasix dose stopped. Please follow up about fluid status as when she resumes normal PO intake she may require lasix PRN. 2. Please obtain a CMP on follow up due to mild hypovolemic hypernatremia (147 at d/c) and mild transaminitis (AST 67/ALT 51) in the setting of recent remdesivir infusion.  3. Macrocytosis without anemia: possibly reticulocytosis in the setting of acute illness. Follow up CBC periodically.  4. Subclinical hypothyroidism vs  Euthyroid sick syndrome: Follow thyroid levels in 4-6 weeks   Significant Procedures: none  Significant Labs and Imaging:  Recent Labs  Lab 08/06/19 0531 08/06/19 0531 08/07/19 0500 08/07/19 1624 08/08/19 0715  WBC 7.9  --   7.7  --  7.6  HGB 13.1  --  13.8  --  12.3  HCT 42.7   < > 45.6 38.7 41.6  PLT 177  --  193  --  165   < > = values in this interval not displayed.   Recent Labs  Lab 08/06/19 0531 08/06/19 0531 08/07/19 0500 08/07/19 0500 08/08/19 0715 08/08/19 0715 08/09/19 0650 08/09/19 1643 08/10/19 0500  NA 148*  --  148*  --  149*  --  151* 147*  --   K 3.5   < > 3.4*   < > 3.8   < > 3.4* 3.8  --   CL 108  --  108  --  115*  --  115* 113*  --   CO2 30  --  29  --  24  --  26 27  --   GLUCOSE 82  --  84  --  82  --  97 133*  --   BUN 22*  --  23*  --  21*  --  17 15  --   CREATININE 0.92  --  0.98  --  0.98  --  0.94 1.05*  --   CALCIUM 9.1  --  9.1  --  8.3*  --  8.6* 8.7*  --   ALKPHOS 46  --  43  --  32*  --  37*  --  36*  AST 40  --  52*  --  73*  --  54*  --  67*  ALT 23  --  29  --  34  --  36  --  51*  ALBUMIN 2.4*  --  2.7*  --  2.1*  --  2.2*  --  2.3*   < > = values in this interval not displayed.   EXAM: 08/01/2019 PORTABLE CHEST 1 VIEW COMPARISON:  07/23/2018 FINDINGS: Low volume chest with patchy bilateral pulmonary infiltrate. Normal heart size. No visible effusion or edema. IMPRESSION: Patchy bilateral pneumonia.  EXAM: 08/03/2019 PORTABLE CHEST 1 VIEW COMPARISON:  08/01/2019 FINDINGS: Cardiac shadow is stable. Patient is significantly rotated to the right. Patchy opacities are again identified within the lungs worst in the left upper lobe consistent with the given clinical history. These have increased somewhat in the interval from the prior exam. No bony abnormality is seen. IMPRESSION: Increase in patchy opacities bilaterally consistent with the given clinical history.  Results/Tests Pending at Time of Discharge: none  Discharge Medications:  Allergies as of 08/10/2019      Reactions   Carbamazepine Rash      Medication List    STOP taking these medications   clotrimazole 1 % cream Commonly known as: LOTRIMIN   loperamide 2 MG capsule Commonly  known as: IMODIUM   ondansetron 4 MG disintegrating tablet Commonly known as: Zofran ODT     TAKE these medications   acetaminophen 650 MG suppository Commonly known as: TYLENOL Place 650 mg rectally every 8 (eight) hours as needed for mild pain. What changed: Another medication with the same name was removed. Continue taking this medication, and follow the directions you see here.   acetaminophen 500 MG tablet Commonly known as: TYLENOL Take 500 mg by mouth every 6 (six) hours  as needed for fever. What changed: Another medication with the same name was removed. Continue taking this medication, and follow the directions you see here.   Banzel 40 MG/ML Susp Generic drug: Rufinamide Take 55ml in the morning and 34ml at night What changed:   how much to take  how to take this  when to take this   Calcarb 600/D 600-400 MG-UNIT tablet Generic drug: Calcium Carbonate-Vitamin D Take 1 tablet by mouth daily. Only gives once a week.   Depakote Sprinkles 125 MG capsule Generic drug: divalproex TAKE 8 CAPSULES BY MOUTH TWICE DAILY What changed:   how much to take  how to take this  when to take this   Diastat AcuDial 20 MG Gel Generic drug: diazepam DIAL TO 15 MG, GIVE RECTALLY FOR SEIZURES LASTING 2 MINUTES OR LONGER   feeding supplement (ENSURE ENLIVE) Liqd Take 237 mLs by mouth 3 (three) times daily between meals.   fluticasone 50 MCG/ACT nasal spray Commonly known as: FLONASE Place 2 sprays into both nostrils 2 (two) times daily. poonam patel MD What changed: when to take this   furosemide 20 MG tablet Commonly known as: LASIX Take 1 tablet PO PRN for fluid retention What changed:   how much to take  how to take this  when to take this  additional instructions   hydrocortisone cream 0.5 % Apply 1 application topically 2 (two) times daily. What changed:   when to take this  reasons to take this   ibuprofen 200 MG tablet Commonly known as: ADVIL Take  400 mg by mouth as needed for moderate pain.   Keppra 100 MG/ML solution Generic drug: levETIRAcetam Take 83ml by mouth every morning and 20ml by mouth at night   lactulose 10 GM/15ML solution Commonly known as: CHRONULAC TAKE 30 ML BY MOUTH TWICE DAILY What changed:   how much to take  how to take this  when to take this   levOCARNitine 1 GM/10ML solution Commonly known as: CARNITOR TAKE 5 MLS BY MOUTH THREE TIMES DAILY What changed:   how much to take  how to take this  when to take this   liver oil-zinc oxide 40 % ointment Commonly known as: DESITIN Apply 1 application topically daily.   nystatin powder Commonly known as: nystatin APPLY TO AFFECTED DAIPER AREAS WITH EVERY DIAPER CHANGE UNTIL RASH CLEARS   pantoprazole sodium 40 mg/20 mL Pack Commonly known as: PROTONIX MIX 1 PACK AND TAKE DAILY AS DIRECTED What changed: See the new instructions.   Topamax Sprinkle 25 MG capsule Generic drug: topiramate TAKE 6 CAPSULES BY MOUTH IN THE MORNING AND 6 CAPSULES BY MOUTH IN THE EVENING WITH FOOD What changed:   how much to take  how to take this  when to take this            Durable Medical Equipment  (From admission, onward)         Start     Ordered   08/10/19 0907  DME Oxygen  (Discharge Planning)  Once    Question Answer Comment  Length of Need 6 Months   Mode or (Route) Nasal cannula   Liters per Minute 2   Frequency Continuous (stationary and portable oxygen unit needed)   Oxygen delivery system Gas      08/10/19 0907          Discharge Instructions: Please refer to Patient Instructions section of EMR for full details.  Patient was counseled important signs and symptoms  that should prompt return to medical care, changes in medications, dietary instructions, activity restrictions, and follow up appointments.   Follow-Up Appointments: Follow-up Information    [facility] Follow up on 08/13/2019.   Why: Virtual  appointment at 10:05 AM with Dr. Foster Simpson.  You will receive a call from our clinic at this time, please do not present to the clinic. Contact information: [address] [place] [address] [phone]          Shirlean Mylar, MD 08/10/2019, 8:29 PM PGY-1, [facility]

## 2019-08-01 NOTE — ED Triage Notes (Signed)
Pt mother brings in pt (pt is non verbal). Mother reports fever x 2 days, last gave tylenol around 0100, has not urinated at all since yesterday, has had a change in her breathing and her saturations have been in the 80's this morning, decreased appetite.

## 2019-08-01 NOTE — H&P (Signed)
Family Medicine Teaching [facility] Admission History and Physical Service Pager: [phone]  Patient name: Michele [name] Medical record number: [id number] Date of birth: 1986-02-24 Age: 34 y.o. Gender: female  Primary Care Provider: Towanda Octave, MD Consultants: n/a Code Status: Full  Chief Complaint: Fever with urinary changes and desaturation  Assessment and Plan: Michele [name] is a 34 y.o. female presenting with fever with urinary changes and new oxygen requirement. PMH is significant for cerebral palsy, obesity, seizure disorder, lymphedema, nonverbal intellectual disability  Sepsis criteria with bilateral pneumonia: Patient is nonverbal and unable to report shortness of breath.  Mom who is nursing home staff noted desaturation to upper 80s at home with fever of 101.  She described this as a normal oxygen saturation for her daughter, although chart review shows saturations in the mid and upper 90s as baseline.  Mom does not describe any cough or any known sick contacts although she works in a nursing home but is currently experiencing the Covid outbreak.  Chest x-ray showed patchy bilateral pneumonia more likely than edema.  Auscultation with diffuse rhonchi.  Patient not showing increased work of breathing on exam but was tachypneic to lower 20s.  There is suspicion of coronavirus and RVP is pending. -Admit to MedSurg, Dr. Pollie Meyer -Airborne and contact precautions pending RVP -Incentive spirometry every 2 while awake -We will hold on remdesivir and dexamethasone until Covid result -O2 supplementation to 94 until Covid result then may reduce to 90-92 -Tylenol for fever as needed -Given potential fluid overload status of patient was not given IV fluids, blood pressures are stable -Start Vanco -Start cefepime -Start Flagyl -Blood cultures drawn x2 -We will continue twice daily Lasix  Suspected UTI: Potentially second contributor to fever, mom describes no urination over the last  24 hours prior to admission.  Mom believes that her child is fluid overloaded and has been using as needed Lasix 20 mg twice daily for the last 3 days.  She says this dose does produce a urinary response.  ED physician noted odor and believes there is likely UTI although lab is still pending. -Urine cultures drawn -Antibiotics as above -Strict IxO's  Infantile cerebral palsy/intellectual disability stable, no change in mental status per [id number] dependent upon mother for ADLs. Lower extremity edema, non-pitting.  - OOB with assistance only -Mom to be allowed to attend patient in the room  ?  Lymphedema versus fluid overload versus obesity: Exam did not show clear pitting edema, mom states that "swelling "is roughly baseline for the patient and has not been acutely changed.  I am unsure if this is lymphedema or obesity and does not appear to be clear volume overload to me.  Mom reports that she believes while they do not actively restrict, just based on physical ability her daughter drinks less than 2 L/day. -Strict I's and O's, monitor reaction to Lasix  Seizure disorder: Stable, no seizures reported.  Takes Depakote 1000mg  BID. Keppra 1200mg  BID. Topamax 150mg  BID, Carnitor 500 mg TID, Banzel susp 5mg  BID. Diastat prn rectal.  - continue home regimen  GERD: Chronic stable on Protonix 10cc daily  - Continue Protonix   Obesity: Patient is currently listed as taking Ensure 3 times a day, could potentially benefit from normalizing weight.  Will consult nutrition  FEN/GI: Heart Healthy Diet  Prophylaxis: Lovenox   FEN/GI: Heart healthy Prophylaxis: Lovenox  Disposition: MedSurg  History of Present Illness:  Michele [name] is a 34 y.o. female presenting with 2-day history of  fever at home reported to be 101.4, 101 in the ED.  Mom also reports 24-hour cessation of urine output, she said due to belief of fluid overload status she has been given Lasix 20 mg twice daily for the last 3  days.  She states this is a good urine producer for her daughter.  Of note mom reports that baseline oxygen saturations are somewhere between 89 and 92 although prior chart review from other outpatient office visits seems to indicate mid 90s to upper 90s.  Mother works in a nursing home and operates as primary caregiver for patient who requires 24-hour care.  Mom also reports decrease in appetite over the last 2 days.  Mom denies that there is been any known sick exposure to the patient although mom works in a nursing home which has a current Covid outbreak.  Mom says she is tested regularly and is not positive at this time. (We did discuss the importance of her checking in with her employer before returning to work with a Covid test pending and her daughter).   Mom states that she has been extremely regular and all of her medications and that her daughter has been well with the exception of the symptoms described above.  Mom confirms full code.  Review Of Systems: Per HPI with the following additions:     Patient Active Problem List   Diagnosis Date Noted  . Upper respiratory infection 08/01/2019  . Anasarca 07/09/2019  . Allergies 06/02/2019  . Upper respiratory symptom 06/02/2019  . Decreased urination 01/10/2019  . Lennox-Gastaut syndrome (HCC) 07/06/2017  . Lymphedema 01/22/2017  . Bilateral lower extremity edema 04/21/2016  . Unsteadiness on feet   . Health care maintenance 07/20/2014  . Amenorrhea 05/19/2014  . 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 Morbid obesity (HCC) 12/24/2012  . 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 URINARY INCONTINENCE 02/25/2010  . Full incontinence of feces 02/25/2010  . Hypothyroidism 01/29/2009  . 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 Cerebral palsy (HCC) 01/29/2009  . GASTROESOPHAGEAL REFLUX DISEASE 01/29/2009  . Osteoporosis 01/29/2009  . Seizure disorder (HCC) 01/29/2009    Past Medical  History: Past Medical History:  Diagnosis Date  . Cerebral palsy (HCC)   . Hypothyroidism   . Lennox-Gastaut syndrome (HCC)   . Osteoporosis   . Pneumonia   . Profound mental retardation   . Reflux   . Rickets   . Seizures (HCC)     Past Surgical History: Past Surgical History:  Procedure Laterality Date  . FOOT MASS EXCISION Right   . Gastrocutaneous fistual repair  2012  . PEG TUBE PLACEMENT    . PEG TUBE REMOVAL  2012 ?    Social History: Social History   Tobacco Use  . Smoking status: Never Smoker  . Smokeless tobacco: Never Used  Substance Use Topics  . Alcohol use: No  . Drug use: No   Additional social history:   Please also refer to relevant sections of EMR.  Family History: Family History  Problem Relation Age of Onset  . Hypertension Mother   . Heart disease Maternal Grandmother        Died at 79  . Stroke Maternal Grandfather        died at 2  . Hypertension Paternal Grandmother   . Diabetes Other        MGGM  . Heart disease Other        MGGF  . Kidney disease Other  Maternal [employer]   . Cancer Maternal Aunt        lung  . Colon cancer Neg Hx      Allergies and Medications: Allergies  Allergen Reactions  . Carbamazepine Rash   No current facility-administered medications on file prior to encounter.   Current Outpatient Medications on File Prior to Encounter  Medication Sig Dispense Refill  . acetaminophen (TYLENOL) 160 MG/5ML suspension Take 320 mg by mouth every 6 (six) hours as needed for mild pain.     Marland Kitchen acetaminophen (TYLENOL) 500 MG tablet Take 500 mg by mouth every 6 (six) hours as needed for fever.    Marland Kitchen acetaminophen (TYLENOL) 650 MG suppository Place 650 mg rectally every 8 (eight) hours as needed for mild pain.    Marland Kitchen BANZEL 40 MG/ML SUSP Take 37ml in the morning and 4ml at night (Patient taking differently: Take 200 mg by mouth in the morning and at bedtime. Take 20ml in the morning and 71ml at night) 460 mL 5  . Calcium  Carbonate-Vitamin D (CALCARB 600/D) 600-400 MG-UNIT tablet Take 1 tablet by mouth daily. Only gives once a week.    Marland Kitchen DEPAKOTE SPRINKLES 125 MG capsule TAKE 8 CAPSULES BY MOUTH TWICE DAILY (Patient taking differently: Take 1,000 mg by mouth 2 (two) times daily. TAKE 8 CAPSULES BY MOUTH TWICE DAILY) 496 capsule 5  . DIASTAT ACUDIAL 20 MG GEL DIAL TO 15 MG, GIVE RECTALLY FOR SEIZURES LASTING 2 MINUTES OR LONGER 1 Package 5  . feeding supplement, ENSURE ENLIVE, (ENSURE ENLIVE) LIQD Take 237 mLs by mouth 3 (three) times daily between meals. 237 mL 12  . fluticasone (FLONASE) 50 MCG/ACT nasal spray Place 2 sprays into both nostrils 2 (two) times daily. poonam patel MD (Patient taking differently: Place 2 sprays into both nostrils daily. poonam patel MD) 16 g 5  . furosemide (LASIX) 20 MG tablet Take 1 tablet PO PRN for fluid retention (Patient taking differently: Take 20 mg by mouth daily. ) 90 tablet 0  . hydrocortisone cream 0.5 % Apply 1 application topically 2 (two) times daily. (Patient taking differently: Apply 1 application topically 2 (two) times daily as needed (irritation). ) 60 g 0  . ibuprofen (ADVIL,MOTRIN) 200 MG tablet Take 400 mg by mouth as needed for moderate pain.    Marland Kitchen KEPPRA 100 MG/ML solution Take 43ml by mouth every morning and 87ml by mouth at night 816 mL 5  . lactulose (CHRONULAC) 10 GM/15ML solution TAKE 30 ML BY MOUTH TWICE DAILY (Patient taking differently: Take 20 g by mouth 2 (two) times daily. TAKE 30 ML BY MOUTH TWICE DAILY) 236 mL 2  . levOCARNitine (CARNITOR) 1 GM/10ML solution TAKE 5 MLS BY MOUTH THREE TIMES DAILY (Patient taking differently: Take 500 mg by mouth 3 (three) times daily. TAKE 5 MLS BY MOUTH THREE TIMES DAILY) 510 mL 5  . liver oil-zinc oxide (DESITIN) 40 % ointment Apply 1 application topically daily.    Marland Kitchen nystatin (NYSTATIN) powder APPLY TO AFFECTED DAIPER AREAS WITH EVERY DIAPER CHANGE UNTIL RASH CLEARS 60 g 0  . pantoprazole sodium (PROTONIX) 40 mg/20 mL  PACK MIX 1 PACK AND TAKE DAILY AS DIRECTED (Patient taking differently: Take 40 mg by mouth daily. MIX 1 PACK AND TAKE DAILY AS DIRECTED) 20 mL 3  . TOPAMAX SPRINKLE 25 MG capsule TAKE 6 CAPSULES BY MOUTH IN THE MORNING AND 6 CAPSULES BY MOUTH IN THE EVENING WITH FOOD (Patient taking differently: Take 150 mg by mouth 2 (two)  times daily. TAKE 6 CAPSULES BY MOUTH IN THE MORNING AND 6 CAPSULES BY MOUTH IN THE EVENING WITH FOOD) 408 capsule 5  . clotrimazole (LOTRIMIN) 1 % cream APPLY EXTERNALLY TO THE AFFECTED AREA TWICE DAILY (Patient not taking: Reported on 08/01/2019) 30 g 1  . loperamide (IMODIUM) 2 MG capsule Take 1 capsule (2 mg total) by mouth as needed for diarrhea or loose stools. (Patient not taking: Reported on 06/25/2019) 30 capsule 0  . ondansetron (ZOFRAN ODT) 4 MG disintegrating tablet Take 1 tablet (4 mg total) by mouth every 6 (six) hours as needed. (Patient not taking: Reported on 12/23/2018) 20 tablet 0    Objective: BP 112/65   Pulse 97   Temp 100 F (37.8 C) (Rectal)   Resp (!) 23   Ht 4\' 11"  (1.499 m)   Wt 100.7 kg   LMP 07/21/2019   SpO2 100%   BMI 44.84 kg/m  Exam: General: Nonverbal but in no obvious distress Eyes: Clear sclera ENTM: No stridor, epistaxis Cardiovascular: Regular rate and rhythm, no obvious murmur on exam Respiratory: Slightly tachypneic to mid 20s, no increased work of breathing, no cough, diffuse rhonchi Gastrointestinal: Mild suprapubic tenderness to firm pressure, no obvious mass or abnormality palpated MSK: No bony injury noted Derm: No rash to exposed skin Neuro: No acute focal neuro deficit indicating stroke Psych: Nonverbal but would respond to voice.  Alert  Labs and Imaging: CBC BMET  Recent Labs  Lab 08/01/19 0551  WBC 7.4  HGB 12.7  HCT 42.0  PLT 181   Recent Labs  Lab 08/01/19 0551  NA 138  K 4.1  CL 107  CO2 23  BUN 10  CREATININE 0.99  GLUCOSE 94  CALCIUM 8.5*     DG Chest Portable 1 View  Result Date:  08/01/2019 CLINICAL DATA:  Cough and fever EXAM: PORTABLE CHEST 1 VIEW COMPARISON:  07/23/2018 FINDINGS: Low volume chest with patchy bilateral pulmonary infiltrate. Normal heart size. No visible effusion or edema. IMPRESSION: Patchy bilateral pneumonia. Electronically Signed   By: 09/21/2018 M.D.   On: 08/01/2019 06:14    09/29/2019, DO 08/01/2019, 7:20 AM PGY-3,  Family Medicine FPTS Intern pager: [phone], text pages welcome

## 2019-08-01 NOTE — Progress Notes (Signed)
Initial Nutrition Assessment   RD working remotely.  INTERVENTION:  Continue Ensure Enlive po TID, each supplement provides 350 kcal and 20 grams of protein.  Encourage adequate PO intake.   NUTRITION DIAGNOSIS:   Increased nutrient needs related to acute illness(COVID) as evidenced by estimated needs.  GOAL:   Patient will meet greater than or equal to 90% of their needs  MONITOR:   PO intake, Supplement acceptance, Skin, Weight trends, Labs, I & O's  REASON FOR ASSESSMENT:   Consult Assessment of nutrition requirement/status  ASSESSMENT:   34 y.o. female presenting with fever with urinary changes and new oxygen requirement. PMH is significant for cerebral palsy, obesity, seizure disorder, lymphedema, nonverbal intellectual disability. Pt hypoxic in ED with bilateral CXR infiltrates. COVID positive. MD suspects COVID PNA.  RD contacted mother via inpatient room phone. Mother reports pt with decreased po and appetite over the past 2 days. She reports pt usually eats well prior to acute illness with usual consumption of 3 meals a day with Ensure shakes TID. Pt with no weight loss per weight records. Pt currently has Ensure ordered. RD to continue with current orders to aid in caloric and protein needs.   Unable to complete Nutrition-Focused physical exam at this time.   Labs and medications reviewed.   Diet Order:   Diet Order            Diet Heart Room service appropriate? Yes; Fluid consistency: Thin  Diet effective now              EDUCATION NEEDS:   Not appropriate for education at this time  Skin:  Skin Assessment: Reviewed RN Assessment  Last BM:  Unknown  Height:   Ht Readings from Last 1 Encounters:  08/01/19 4\' 11"  (1.499 m)    Weight:   Wt Readings from Last 1 Encounters:  08/01/19 100.7 kg    BMI:  Body mass index is 44.84 kg/m.  Estimated Nutritional Needs:   Kcal:  1500-1800  Protein:  80-100 grams  Fluid:  >/= 1.5  L/day    09/29/19, MS, RD, LDN RD pager number/after hours weekend pager number on Amion.

## 2019-08-02 DIAGNOSIS — R6510 Systemic inflammatory response syndrome (SIRS) of non-infectious origin without acute organ dysfunction: Secondary | ICD-10-CM

## 2019-08-02 DIAGNOSIS — R0902 Hypoxemia: Secondary | ICD-10-CM

## 2019-08-02 LAB — COMPREHENSIVE METABOLIC PANEL
ALT: 22 U/L (ref 0–44)
AST: 30 U/L (ref 15–41)
Albumin: 2.2 g/dL — ABNORMAL LOW (ref 3.5–5.0)
Alkaline Phosphatase: 44 U/L (ref 38–126)
Anion gap: 9 (ref 5–15)
BUN: 9 mg/dL (ref 6–20)
CO2: 25 mmol/L (ref 22–32)
Calcium: 8.9 mg/dL (ref 8.9–10.3)
Chloride: 109 mmol/L (ref 98–111)
Creatinine, Ser: 1 mg/dL (ref 0.44–1.00)
GFR calc Af Amer: >60 mL/min (ref >60)
GFR calc non Af Amer: >60 mL/min (ref >60)
Glucose, Bld: 129 mg/dL — ABNORMAL HIGH (ref 70–99)
Potassium: 3.6 mmol/L (ref 3.5–5.1)
Sodium: 143 mmol/L (ref 135–145)
Total Bilirubin: 0.3 mg/dL (ref 0.3–1.2)
Total Protein: 7 g/dL (ref 6.5–8.1)

## 2019-08-02 LAB — D-DIMER, QUANTITATIVE: D-Dimer, Quant: 0.39 ug/mL-FEU (ref 0.00–0.50)

## 2019-08-02 LAB — URINE CULTURE

## 2019-08-02 LAB — CBC
HCT: 42.1 % (ref 36.0–46.0)
Hemoglobin: 12.9 g/dL (ref 12.0–15.0)
MCH: 32.7 pg (ref 26.0–34.0)
MCHC: 30.6 g/dL (ref 30.0–36.0)
MCV: 106.6 fL — ABNORMAL HIGH (ref 80.0–100.0)
Platelets: 160 10*3/uL (ref 150–400)
RBC: 3.95 MIL/uL (ref 3.87–5.11)
RDW: 14.8 % (ref 11.5–15.5)
WBC: 8.4 10*3/uL (ref 4.0–10.5)
nRBC: 0 % (ref 0.0–0.2)

## 2019-08-02 LAB — FERRITIN: Ferritin: 69 ng/mL (ref 11–307)

## 2019-08-02 LAB — C-REACTIVE PROTEIN: CRP: 7.3 mg/dL — ABNORMAL HIGH (ref <1.0)

## 2019-08-02 MED ORDER — ENOXAPARIN SODIUM 60 MG/0.6ML ~~LOC~~ SOLN: 50.0000 mg | Freq: Every day | SUBCUTANEOUS | Status: DC

## 2019-08-02 NOTE — Progress Notes (Signed)
Family Medicine Teaching Service Daily Progress Note Intern Pager: [phone]  Patient name: Michele [name] Medical record number: [id number] Date of birth: [birth year] Age: 34 y.o. Gender: female  Primary Care Provider: Lattie Haw, MD Consultants: None Code Status: Full  Pt Overview and Major Events to Date:  Michele [name] is a 34 y.o. female presenting with fever with and new oxygen requirement. PMH is significant for cerebral palsy, obesity, seizure disorder, lymphedema, nonverbal intellectual disability  Assessment and Plan:  Sepsis secondary to COVID-19. Sepsis resolved. COVID-19 infection stable. Found to be COVID-19 positive on admission. Patient with stable vitals with Tmax of 100.4 at 0449 today (2/13). Heart rate normal, RR 19 and O2 sats 93-97% on room air. WBC count from yesterday was 7.1, still pending this am. Lactic acid on admission was 1.0. Patient resting comfortably in bed, non-verbal at baseline. EKG showed NSR with short PR interval, CXR significant for patchy bilateral pneumonia but when compared to previous CXR on 2/4 it was unchanged. - Cont continuous pulse ox to keep O2 sats above 94% on room air. If below, start O2 [place] and dexamethasone - Cont vitals per floor - Cont Telemetry - Follow up BCx - Discontinued Vanc (2/12) and Cefepime (2/12) and Flagyl (2/12) as this is all most likely COVID PNA. - Cont Tylenol scheduled - Cont Remdesivir infusion (2/12-) for total of 5 doses, day 2 of 5 - Cont Decadron (2/12-) for total of 10 days; day 2 of 10 - Cont to follow daily COVID labs (CRP, D-Dimer, and Ferrritin) - Daily CBC and BMP  Cerebral Palsy. Chronic, stable. - Cont to monitor for signs of discomfort and spacticity.  Seizure Disorder. Chronic, stable. Takes Depakote 1000mg  BID. Keppra 1200mg  BID. Topamax 150mg  BID, Carnitor 500 mg TID, Banzel susp 5mg  BID. Diastat prn rectal.  - continue home regimen  Lymphedema Chronic. Stable. On home lasix at 20mg  daily  PRN. - Cont strict I/Os, daily weights - Holding home Lasix.  GERD Chronic, stable.  - Cont Protonix 40mg  daily  Obestiy - Reg diet while in hospital - Recommend monitoring weight outpatient but challenging to get weight loss given patient's CP.  FEN/GI: Protonix PPx: Lovenox  Disposition: stable  Subjective:  Saw patient this am resting in bed with blanket over her head. I used to the the PCP for mom who was not satisfied with my care I provided in clinic. She asked if another doctor could see her daughter. I informed her I would arrange for another provider to care for her daughter while in the hospital.   Objective: Temp:  [98.8 F (37.1 C)-100.4 F (38 C)] 100.4 F (38 C) (02/13 0449) Pulse Rate:  [90-93] 93 (02/12 2020) Resp:  [19] 19 (02/12 2020) BP: (116)/(80) 116/80 (02/12 2020) SpO2:  [93 %-97 %] 93 % (02/12 2020) Physical Exam: Declined per mom who is HCPOA. Will be done today by Dr. Owens Shark who is the attending.  Laboratory: Recent Labs  Lab 08/01/19 0551 08/01/19 1230  WBC 7.4 7.1  HGB 12.7 13.5  HCT 42.0 43.2  PLT 181 160   Recent Labs  Lab 08/01/19 0551 08/01/19 0751  NA 138  --   K 4.1  --   CL 107  --   CO2 23  --   BUN 10  --   CREATININE 0.99 1.00  CALCIUM 8.5*  --   PROT 6.2*  --   BILITOT 0.5  --   ALKPHOS 49  --   ALT 22  --  AST 33  --   GLUCOSE 94  --    2/12  D-Dimer: 0.51  Ferritin: 61  Fibrinogen: 373  CRP: 4.0  LA: 0.9  U/A w/reflex micro:  Moderate Hgb, 15 ketones, No Leuks or nitrites; Few bacteria, 0-5 RBC, 0-5 WBC  Infectious Work up: BCx: pending UCx: pending COVID-19: Positive    Imaging/Diagnostic Tests: 2/12 CXR  IMPRESSION: Patchy bilateral pneumonia.   Arlyce Harman, DO 08/02/2019, 8:23 AM PGY-3, [place] [facility] FPTS Intern pager: [phone], text pages welcome

## 2019-08-02 NOTE — Progress Notes (Signed)
MEWS/VS Documentation      08/02/2019 0845 08/02/2019 1243 08/02/2019 1246 08/02/2019 1248   MEWS Score:  1  3  3  1    MEWS Score Color:  Green  Yellow  Yellow  Green   Resp:  --  (!) 32  (!) 29  20   Pulse:  --  (!) 104  (!) 108  --   BP:  --  113/71  --  --   Temp:  --  98.6 F (37 C)  --  --   Level of Consciousness:  Alert  --  --  --    MEWS yellow due to low oxygen and high RR. Patient was repositioned a her cannula came off. At this time patient is resting quietly, no acute distress noted, no complaints. Mom at bedside. Will continue to monitor.

## 2019-08-02 NOTE — Progress Notes (Signed)
FPTS Interim Progress Note  S: Went to check in on patient due to some elevated respiratory rate and oxygen requirement during the day.  Patient was resting comfortably, and her mother was at bedside.  Her mother reports that she is breathing comfortably and doing well although she and her nurse did notice some thrush on her tongue today.  O: BP 99/62   Pulse (!) 106   Temp 99.1 F (37.3 C) (Axillary)   Resp (!) 24   Ht 4\' 11"  (1.499 m)   Wt 100.7 kg   LMP 07/21/2019   SpO2 93%   BMI 44.84 kg/m   General: Comfortable appearing female asleep, resting comfortably Cardiac: Mildly tachycardic, regular rhythm Respiratory: Crackles bilaterally but appropriate air movement and normal respiratory rate, O2 saturation 96% on 1 L O2  A/P: Ms. Gutridge appears to be stable currently.  Reassured that her oxygen requirement has improved from 2 L to 1 L today and she is maintaining appropriate oxygen saturations with normal respiratory effort.  Ms. Claros was unwilling to open her mouth for an exam this evening, so will examine for thrush in the morning.  Ala Dach, MD 08/02/2019, 8:50 PM PGY-3, [facility] Family Medicine Service pager [phone]

## 2019-08-02 NOTE — Progress Notes (Signed)
FPTS Interim Progress Note Visited patients room. She is sleeping comfortably. VSS. Mother at bedside.   Lavonda Jumbo, DO 08/02/2019, 12:40 AM PGY-1, [facility] Family Medicine Service pager [phone]

## 2019-08-02 NOTE — Progress Notes (Signed)
Patient's mother in her room per MD request. MOM was informed about the risk she is exposing herself being in the same room with her daughter. Mom verbalized that she was tested positive for COVID 19 and she is aware about the risk. Patient is more cooperative when mom is with her. Will continue to monitor.

## 2019-08-03 ENCOUNTER — Inpatient Hospital Stay (HOSPITAL_COMMUNITY): Payer: Medicare Other

## 2019-08-03 DIAGNOSIS — U071 COVID-19: Secondary | ICD-10-CM | POA: Diagnosis present

## 2019-08-03 DIAGNOSIS — G809 Cerebral palsy, unspecified: Secondary | ICD-10-CM

## 2019-08-03 DIAGNOSIS — J9601 Acute respiratory failure with hypoxia: Secondary | ICD-10-CM

## 2019-08-03 DIAGNOSIS — E038 Other specified hypothyroidism: Secondary | ICD-10-CM

## 2019-08-03 DIAGNOSIS — N179 Acute kidney failure, unspecified: Secondary | ICD-10-CM | POA: Diagnosis not present

## 2019-08-03 HISTORY — DX: Acute respiratory failure with hypoxia: J96.01

## 2019-08-03 HISTORY — DX: COVID-19: U07.1

## 2019-08-03 LAB — BASIC METABOLIC PANEL
Anion gap: 10 (ref 5–15)
BUN: 12 mg/dL (ref 6–20)
CO2: 27 mmol/L (ref 22–32)
Calcium: 8.9 mg/dL (ref 8.9–10.3)
Chloride: 107 mmol/L (ref 98–111)
Creatinine, Ser: 1.08 mg/dL — ABNORMAL HIGH (ref 0.44–1.00)
GFR calc Af Amer: >60 mL/min (ref >60)
GFR calc non Af Amer: >60 mL/min (ref >60)
Glucose, Bld: 95 mg/dL (ref 70–99)
Potassium: 3.5 mmol/L (ref 3.5–5.1)
Sodium: 144 mmol/L (ref 135–145)

## 2019-08-03 LAB — CBC
HCT: 39.7 % (ref 36.0–46.0)
Hemoglobin: 12.3 g/dL (ref 12.0–15.0)
MCH: 32.7 pg (ref 26.0–34.0)
MCHC: 31 g/dL (ref 30.0–36.0)
MCV: 105.6 fL — ABNORMAL HIGH (ref 80.0–100.0)
Platelets: 141 10*3/uL — ABNORMAL LOW (ref 150–400)
RBC: 3.76 MIL/uL — ABNORMAL LOW (ref 3.87–5.11)
RDW: 14.8 % (ref 11.5–15.5)
WBC: 7.9 10*3/uL (ref 4.0–10.5)
nRBC: 0.4 % — ABNORMAL HIGH (ref 0.0–0.2)

## 2019-08-03 LAB — C-REACTIVE PROTEIN: CRP: 10.6 mg/dL — ABNORMAL HIGH (ref <1.0)

## 2019-08-03 LAB — D-DIMER, QUANTITATIVE: D-Dimer, Quant: 0.43 ug/mL-FEU (ref 0.00–0.50)

## 2019-08-03 LAB — HEPATITIS B SURFACE ANTIGEN: Hepatitis B Surface Ag: NONREACTIVE

## 2019-08-03 LAB — FERRITIN: Ferritin: 81 ng/mL (ref 11–307)

## 2019-08-03 MED ORDER — FUROSEMIDE 20 MG PO TABS
20.0000 mg | ORAL_TABLET | Freq: Every day | ORAL | Status: DC
  Administered 2019-08-03: 10:00:00 20 mg via ORAL
  Filled 2019-08-03: qty 1

## 2019-08-03 MED ORDER — FLUTICASONE PROPIONATE 50 MCG/ACT NA SUSP
1.0000 | Freq: Every day | NASAL | Status: DC | PRN
  Administered 2019-08-03: 1 via NASAL
  Filled 2019-08-03: qty 16

## 2019-08-03 MED ORDER — NYSTATIN 100000 UNIT/ML MT SUSP
5.0000 mL | Freq: Four times a day (QID) | OROMUCOSAL | Status: DC
  Administered 2019-08-03 – 2019-08-10 (×28): 500000 [IU] via ORAL
  Filled 2019-08-03 (×25): qty 5

## 2019-08-03 MED ORDER — TOCILIZUMAB 400 MG/20ML IV SOLN
6.0000 mg/kg | Freq: Once | INTRAVENOUS | Status: AC
  Administered 2019-08-03: 604 mg via INTRAVENOUS
  Filled 2019-08-03: qty 20.2

## 2019-08-03 MED ORDER — ENOXAPARIN SODIUM 60 MG/0.6ML ~~LOC~~ SOLN
50.0000 mg | Freq: Every day | SUBCUTANEOUS | Status: DC
  Administered 2019-08-03 – 2019-08-10 (×8): 50 mg via SUBCUTANEOUS
  Filled 2019-08-03 (×8): qty 0.6

## 2019-08-03 NOTE — Progress Notes (Addendum)
Family Medicine Teaching Service Daily Progress Note Intern Pager: [phone]  Patient name: Michele [name] Medical record number: [id number] Date of birth: 1986/02/06 Age: 34 y.o. Gender: female  Primary Care Braelin Costlow: Towanda Octave, MD Consultants: None Code Status: Full  Pt Overview and Major Events to Date:  BEVELY [name] is a 34 y.o. female presenting with fever with and new oxygen requirement. PMH is significant for cerebral palsy, obesity, seizure disorder, lymphedema, nonverbal intellectual disability  Assessment and Plan:  COVID-19 Pneumonia: Worsening.  Oxygen requirement worsened overnight from 1L to 3L, now with O2 saturations in upper 80s.  Patient is also febrile again on 1/14 to 102 F at 0500.  Respiratory rate slightly elevated, although work of breathing appears reassuring on exam.  WBC count and COVID inflammatory markers pending.  Patient resting comfortably in bed, non-verbal at baseline, appears tired.  On day 3 of remdesivir and dexamethasone.  Procalcitonin was negative, so unlikely to be bacterial pneumonia.  Will transfer to progressive, obtain CXR, watch oxygen status closely, try to prone patient, and discuss possibility of starting tocilizumab with patient's mother if quantiferon gold, hepatitis B labs are negative.   - attempt to prone - f/u hepatitis B antigen, quantiferon gold - follow up repeat CXR - Cont continuous pulse ox to keep O2 sats above 94% on room air - Cont vitals per floor - Cont Telemetry - Follow up BCx -no growth 1 day - Cont Tylenol scheduled - Cont Remdesivir infusion (2/12-) for total of 5 doses, day 3 of 5 - Cont Decadron (2/12-) for total of 10 days; day 3 of 10 - Cont to follow daily COVID labs (CRP, D-Dimer, and Ferrritin) - Daily CBC and BMP - restart Lasix 20 mg PO daily to assist respiratory status  Thrush.  First noted on 2/13 by patient's mother and her nurse.  Unable to exhibited today due to lack of patient cooperation.  Likely  due to Decadron. -Start nystatin solution  Cerebral Palsy. Chronic, stable. - Cont to monitor for signs of discomfort and spacticity.  Seizure Disorder. Chronic, stable. Takes Depakote 1000mg  BID. Keppra 1200mg  BID. Topamax 150mg  BID, Carnitor 500 mg TID, Banzel susp 5mg  BID. Diastat prn rectal.  - continue home regimen  Lymphedema Chronic. Stable. On home lasix at 20mg  daily PRN.  P.o. intake 400 mL in last 24 hours. UOP 700 ml in last 24 hours (0.6 ml/kg/hr) - Cont strict I/Os, daily weights - restarting home Lasix 20 mg daily 2/14   GERD Chronic, stable.  - Cont Protonix 40mg  daily  Obesity - Reg diet while in hospital - Recommend monitoring weight outpatient but challenging to get weight loss given patient's CP.  FEN/GI: Protonix PPx: Lovenox  Disposition: stable  Subjective:  Mother reports concern about thrush and reports that [name] appears to be breathing normally but does look flushed.  Patient's mother is agreeable to trying to prone Michele [name] but does not think she will tolerate this for long.  She will consider starting tocilizumab after discussion of risks and benefits and is okay with checking necessary labs beforehand.  Objective: Temp:  [98.6 F (37 C)-102 F (38.9 C)] 102 F (38.9 C) (02/14 0515) Pulse Rate:  [100-108] 106 (02/13 1933) Resp:  [20-32] 24 (02/13 1933) BP: (99-113)/(62-71) 99/62 (02/13 1933) SpO2:  [76 %-98 %] 93 % (02/13 1933) Physical Exam: General: tired appearing female Cardiac: mildly tachycardic, regular rate, no murmur Respiratory: mildly increased respiratory rate but no significantly increased work of breathing, crackles throughout, current  O2 saturation of 88% on 3L O2 Extremities: lymphedema present in bilateral upper and lower extremities  Laboratory: Recent Labs  Lab 08/01/19 0551 08/01/19 1230 08/02/19 1229  WBC 7.4 7.1 8.4  HGB 12.7 13.5 12.9  HCT 42.0 43.2 42.1  PLT 181 160 160   Recent Labs  Lab 08/01/19 0551  08/01/19 0751 08/02/19 1229  NA 138  --  143  K 4.1  --  3.6  CL 107  --  109  CO2 23  --  25  BUN 10  --  9  CREATININE 0.99 1.00 1.00  CALCIUM 8.5*  --  8.9  PROT 6.2*  --  7.0  BILITOT 0.5  --  0.3  ALKPHOS 49  --  44  ALT 22  --  22  AST 33  --  30  GLUCOSE 94  --  129*   2/12 - current  D-Dimer: 0.51>0.39  Ferritin: 61>69  CRP: 4.0>7.3  Infectious Work up: BCx: NG1D UCx: multiple species COVID-19: Positive    Imaging/Diagnostic Tests: 2/12 CXR  IMPRESSION: Patchy bilateral pneumonia.   Kathrene Alu, MD 08/03/2019, 5:40 AM PGY-3, [place] Intern pager: [phone], text pages welcome

## 2019-08-04 LAB — BASIC METABOLIC PANEL
Anion gap: 9 (ref 5–15)
BUN: 16 mg/dL (ref 6–20)
CO2: 30 mmol/L (ref 22–32)
Calcium: 8.9 mg/dL (ref 8.9–10.3)
Chloride: 105 mmol/L (ref 98–111)
Creatinine, Ser: 0.95 mg/dL (ref 0.44–1.00)
GFR calc Af Amer: >60 mL/min (ref >60)
GFR calc non Af Amer: >60 mL/min (ref >60)
Glucose, Bld: 91 mg/dL (ref 70–99)
Potassium: 3.7 mmol/L (ref 3.5–5.1)
Sodium: 144 mmol/L (ref 135–145)

## 2019-08-04 LAB — CBC
HCT: 38.6 % (ref 36.0–46.0)
Hemoglobin: 11.9 g/dL — ABNORMAL LOW (ref 12.0–15.0)
MCH: 32.7 pg (ref 26.0–34.0)
MCHC: 30.8 g/dL (ref 30.0–36.0)
MCV: 106 fL — ABNORMAL HIGH (ref 80.0–100.0)
Platelets: 141 10*3/uL — ABNORMAL LOW (ref 150–400)
RBC: 3.64 MIL/uL — ABNORMAL LOW (ref 3.87–5.11)
RDW: 14.6 % (ref 11.5–15.5)
WBC: 9.5 10*3/uL (ref 4.0–10.5)
nRBC: 0.4 % — ABNORMAL HIGH (ref 0.0–0.2)

## 2019-08-04 LAB — QUANTIFERON-TB GOLD PLUS (RQFGPL)
QuantiFERON Mitogen Value: >10 IU/mL
QuantiFERON Nil Value: 0.33 IU/mL
QuantiFERON TB1 Ag Value: 0.28 IU/mL
QuantiFERON TB2 Ag Value: 0.24 IU/mL

## 2019-08-04 LAB — QUANTIFERON-TB GOLD PLUS: QuantiFERON-TB Gold Plus: NEGATIVE

## 2019-08-04 LAB — C-REACTIVE PROTEIN: CRP: 13.9 mg/dL — ABNORMAL HIGH (ref <1.0)

## 2019-08-04 LAB — FERRITIN: Ferritin: 78 ng/mL (ref 11–307)

## 2019-08-04 LAB — D-DIMER, QUANTITATIVE: D-Dimer, Quant: 0.49 ug/mL-FEU (ref 0.00–0.50)

## 2019-08-04 MED ORDER — FUROSEMIDE 20 MG PO TABS
20.0000 mg | ORAL_TABLET | Freq: Once | ORAL | Status: AC
  Administered 2019-08-04: 12:00:00 20 mg via ORAL
  Filled 2019-08-04: qty 1

## 2019-08-04 NOTE — Progress Notes (Addendum)
Family Medicine Teaching Service Daily Progress Note Intern Pager: [phone]  Patient name: Michele [name] Medical record number: [id number] Date of birth: 05-13-1986 Age: 34 y.o. Gender: female  Primary Care Provider: Towanda Octave, MD Consultants: None Code Status: Full  Pt Overview and Major Events to Date:  Michele [name] is a 34 y.o. female presenting with fever with and new oxygen requirement. PMH is significant for cerebral palsy, obesity, seizure disorder, lymphedema, nonverbal intellectual disability  Assessment and Plan:  COVID-19 Pneumonia:    Patient's mother agreed to starting tocilizumab which was given yesterday. Oxygen requirement continues at 3 L, now with O2 saturations in mid 90s.  Patient afebrile overnight.  A.m. labs show WBC 9.5, CRP increasing at 13.9, up from 10.6 yesterday.  D-dimer mildly increased from 0.43 yesterday to 0.49 today.  Patient resting comfortably in bed, non-verbal at baseline, appears tired.  On day 4 of remdesivir and dexamethasone.  Procalcitonin was negative, so unlikely to be bacterial pneumonia.   - attempt to prone - Cont continuous pulse ox to keep O2 sats above 94% on room air - Cont vitals per floor - Cont Telemetry - Follow up BCx -no growth 3 days - Cont Tylenol scheduled - Cont Remdesivir infusion (2/12-) for total of 5 doses, day 4 of 5 - Cont Decadron (2/12-) for total of 10 days; day 4 of 10 - Cont to follow daily COVID labs (CRP, D-Dimer, and Ferrritin) - Daily CBC and BMP -Restart home Lasix -Daily weights  Thrush.  First noted on 2/13 by patient's mother and her nurse. Likely due to Decadron.  Unable to be viewed due to lack of patient cooperation.  Patient's mother feels the patient's thrush is part of the reason that her p.o. intake was so poor yesterday, she endorses the patient seems to be doing better today and expect the patient will have better p.o. intake. -Start nystatin solution  Cerebral Palsy. Chronic, stable. - Cont  to monitor for signs of discomfort and spacticity.  Seizure Disorder. Chronic, stable. Takes Depakote 1000mg  BID. Keppra 1200mg  BID. Topamax 150mg  BID, Carnitor 500 mg TID, Banzel susp 5mg  BID. Diastat prn rectal.  - continue home regimen  Lymphedema Chronic. Stable. On home lasix at 20mg  daily PRN.  P.o. intake 89ml in last 24 hours. UOP in last 24 hours with 1 additional unmeasured urine output. - Cont strict I/Os, daily weights -Hold home Lasix  GERD Chronic, stable.  - Cont Protonix 40mg  daily  Obesity - Reg diet while in hospital - Recommend monitoring weight outpatient but challenging to get weight loss given patient's CP.  FEN/GI: Protonix PPx: Lovenox  Disposition: stable  Subjective:  Patient mother endorses that patient seems to be feeling better.  She states that patient has not eaten or drank much in the past 24 hours which she believes is due to thrush.  She states that she feels her intake will be better today as she seems to feel better.  Objective: Temp:  [97.5 F (36.4 C)-101.4 F (38.6 C)] 97.5 F (36.4 C) (02/15 0347) Pulse Rate:  [70-111] 78 (02/15 0400) Resp:  [14-29] 27 (02/15 0010) BP: (89-119)/(52-80) 90/67 (02/15 0400) SpO2:  [86 %-98 %] 95 % (02/15 0400) Physical Exam: General: Lying in bed, not appearing in distress Heart: Regular rate and rhythm with no murmurs appreciated Lungs: Satting well on 3 L nasal cannula, no respiratory distress, fine crackles noted bilaterally Abdomen: No abdominal pain on palpation Skin: Warm and dry  Laboratory: Recent Labs  Lab 08/01/19 1230 08/02/19 1229 08/03/19 0715  WBC 7.1 8.4 7.9  HGB 13.5 12.9 12.3  HCT 43.2 42.1 39.7  PLT 160 160 141*   Recent Labs  Lab 08/01/19 0551 08/01/19 0551 08/01/19 0751 08/02/19 1229 08/03/19 0715  NA 138  --   --  143 144  K 4.1  --   --  3.6 3.5  CL 107  --   --  109 107  CO2 23  --   --  25 27  BUN 10  --   --  9 12  CREATININE 0.99   < > 1.00 1.00  1.08*  CALCIUM 8.5*  --   --  8.9 8.9  PROT 6.2*  --   --  7.0  --   BILITOT 0.5  --   --  0.3  --   ALKPHOS 49  --   --  44  --   ALT 22  --   --  22  --   AST 33  --   --  30  --   GLUCOSE 94  --   --  129* 95   < > = values in this interval not displayed.   2/12 - current  D-Dimer: 0.51>0.39  Ferritin: 61>69  CRP: 4.0>7.3  Infectious Work up: BCx: NG1D UCx: multiple species COVID-19: Positive    Imaging/Diagnostic Tests: 2/12 CXR  IMPRESSION: Patchy bilateral pneumonia.   Lurline Del, DO 08/04/2019, 6:19 AM PGY-3, [place] Intern pager: [phone], text pages welcome

## 2019-08-04 NOTE — Consult Note (Signed)
   [facility] Inpatient Consult   08/04/2019  Michele [name] 1985/11/07 [id number]   Patient is a Medicare Next Gen member in the Embedded [facility] Family Medicine practice.  This office provides the transition of care follow up and will alert the Embedded RN Care Coordinator of patient's admission.  Chart review reveals patient is per MD progress notes of history and physical which includes but not limited to the following:  Michele [name] is a 34 y.o. female presenting with fever with and new oxygen requirement. PMH is significant for cerebral palsy, obesity, seizure disorder, lymphedema, nonverbal intellectual disability.  Admitting Diagnosis of   COVID-19 Pneumonia.  Patient's mother is her contact.  Plan:  Will update Embedded team at disposition of needs. Follow up for transition of care is with [facility] with the Embedded Chronic Care Management team, if appropriate and mother agrees.  For questions, please contact:  Charlesetta Shanks, RN BSN CCM [facility]  [phone] business mobile phone Toll free office [phone]  Fax number: [phone] [email or web address]

## 2019-08-05 LAB — BASIC METABOLIC PANEL
Anion gap: 8 (ref 5–15)
BUN: 20 mg/dL (ref 6–20)
CO2: 30 mmol/L (ref 22–32)
Calcium: 8.8 mg/dL — ABNORMAL LOW (ref 8.9–10.3)
Chloride: 107 mmol/L (ref 98–111)
Creatinine, Ser: 0.88 mg/dL (ref 0.44–1.00)
GFR calc Af Amer: >60 mL/min (ref >60)
GFR calc non Af Amer: >60 mL/min (ref >60)
Glucose, Bld: 92 mg/dL (ref 70–99)
Potassium: 3.8 mmol/L (ref 3.5–5.1)
Sodium: 145 mmol/L (ref 135–145)

## 2019-08-05 LAB — CBC
HCT: 40 % (ref 36.0–46.0)
Hemoglobin: 12.2 g/dL (ref 12.0–15.0)
MCH: 32.4 pg (ref 26.0–34.0)
MCHC: 30.5 g/dL (ref 30.0–36.0)
MCV: 106.1 fL — ABNORMAL HIGH (ref 80.0–100.0)
Platelets: 156 10*3/uL (ref 150–400)
RBC: 3.77 MIL/uL — ABNORMAL LOW (ref 3.87–5.11)
RDW: 14.6 % (ref 11.5–15.5)
WBC: 8.2 10*3/uL (ref 4.0–10.5)
nRBC: 0.7 % — ABNORMAL HIGH (ref 0.0–0.2)

## 2019-08-05 LAB — D-DIMER, QUANTITATIVE: D-Dimer, Quant: 0.33 ug/mL-FEU (ref 0.00–0.50)

## 2019-08-05 LAB — HEPATIC FUNCTION PANEL
ALT: 19 U/L (ref 0–44)
AST: 32 U/L (ref 15–41)
Albumin: 2.2 g/dL — ABNORMAL LOW (ref 3.5–5.0)
Alkaline Phosphatase: 42 U/L (ref 38–126)
Bilirubin, Direct: <0.1 mg/dL (ref 0.0–0.2)
Total Bilirubin: 0.5 mg/dL (ref 0.3–1.2)
Total Protein: 5.8 g/dL — ABNORMAL LOW (ref 6.5–8.1)

## 2019-08-05 LAB — FERRITIN: Ferritin: 83 ng/mL (ref 11–307)

## 2019-08-05 LAB — C-REACTIVE PROTEIN: CRP: 9.2 mg/dL — ABNORMAL HIGH (ref <1.0)

## 2019-08-05 NOTE — Progress Notes (Signed)
Family Medicine Teaching Service Daily Progress Note Intern Pager: [phone]  Patient name: Michele [name] Medical record number: [id number] Date of birth: 04-Jan-1986 Age: 34 y.o. Gender: female  Primary Care Provider: Towanda Octave, MD Consultants: None Code Status: Full  Pt Overview and Major Events to Date:  Michele [name] is a 34 y.o. female presenting with fever with and new oxygen requirement. PMH is significant for cerebral palsy, obesity, seizure disorder, lymphedema, nonverbal intellectual disability  Assessment and Plan:  COVID-19 Pneumonia:    Tocilizumab dose x1 given 2/14. Oxygen requirement continues at 3 L, now with O2 saturations in mid 90s.  Patient afebrile overnight.  A.m. labs show WBC 8.2, CRP decreased to 9.2, down from 13.9 yesterday.  D-dimer decreased from 0.49 yesterday to 0.33 today.  Patient resting comfortably in bed, non-verbal at baseline, appears tired.  CMP pending. On day 5/5 of remdesivir and 5/10 dexamethasone.  Procalcitonin was negative, so unlikely to be bacterial pneumonia.   - attempt to prone - Cont continuous pulse ox to keep O2 sats above 94% on room air - Cont vitals per floor - Cont Telemetry - Follow up BCx -no growth 4 days - Cont Tylenol scheduled - Cont Remdesivir infusion (2/12-) for total of 5 doses, day 5 of 5 - Cont Decadron (2/12-) for total of 10 days; day 5 of 10 - Cont to follow daily COVID labs (CRP, D-Dimer, and Ferrritin) - Daily CBC and CMP -Continue home Lasix -Daily weights  Thrush.  First noted on 2/13 by patient's mother and her nurse. Likely due to Decadron.  Unable to be viewed due to lack of patient cooperation.  Patient's mother feels the patient's thrush is part of the reason that her p.o. intake was so poor since 2/15, she endorses the patient seems to be doing better today and expect the patient will have better p.o. intake. -Continue nystatin solution  Lymphedema Chronic. Stable. On home lasix at 20mg  daily PRN.   P.o. intake not charted in last 48 hours. Mother reports that PO intake is very poor, patient has not had any water or ensure to drink at all. Likely due to feeling poorly from COVID-19, but also possibly due to thrush. Patient should receive 1shift bladder scans, no UOP charted in 48 hours. Patient on strict I/Os, will continue and request daily weights, qshift bladder scans. If no UOP and poor PO intake, can consider IV bolus, but must balance with lymphedema which is present and at baseline per mother. - Cont strict I/Os, daily weights, qshift bladder scans - Hold home Lasix  Cerebral Palsy. Chronic, stable. - Cont to monitor for signs of discomfort and spacticity.  Seizure Disorder. Chronic, stable. Takes Depakote 1000mg  BID. Keppra 1200mg  BID. Topamax 150mg  BID, Carnitor 500 mg TID, Banzel susp 5mg  BID. Diastat prn rectal.  - continue home regimen  GERD Chronic, stable.  - Cont Protonix 40mg  daily  Obesity - Reg diet while in hospital - Recommend monitoring weight outpatient but challenging to get weight loss given patient's CP.  FEN/GI: Protonix PPx: Lovenox  Disposition: stable  Subjective:  Patient at baseline cognition, but not eating or drinking per mother.  Objective: Temp:  [97.3 F (36.3 C)-98.4 F (36.9 C)] 97.3 F (36.3 C) (02/16 0430) Pulse Rate:  [52-78] 56 (02/16 0528) Resp:  [19-26] 24 (02/16 0528) BP: (85-116)/(51-76) 85/51 (02/16 0528) SpO2:  [91 %-98 %] 98 % (02/16 0528) Physical Exam: General: Lying in bed, not appearing in distress Heart: Regular rate and rhythm  with no murmurs appreciated Lungs: Satting well on 3 L nasal cannula, no respiratory distress, fine crackles noted bilaterally with diminished air flow at bilateral bases Abdomen: No abdominal pain on palpation Skin: Warm and dry Extremities: +1 pitting pedal edema  Laboratory: Recent Labs  Lab 08/03/19 0715 08/04/19 0610 08/05/19 0402  WBC 7.9 9.5 8.2  HGB 12.3 11.9* 12.2  HCT 39.7  38.6 40.0  PLT 141* 141* 156   Recent Labs  Lab 08/01/19 0551 08/01/19 0751 08/02/19 1229 08/02/19 1229 08/03/19 0715 08/04/19 0610 08/05/19 0402  NA 138   < > 143   < > 144 144 145  K 4.1   < > 3.6   < > 3.5 3.7 3.8  CL 107   < > 109   < > 107 105 107  CO2 23   < > 25   < > 27 30 30   BUN 10   < > 9   < > 12 16 20   CREATININE 0.99   < > 1.00   < > 1.08* 0.95 0.88  CALCIUM 8.5*   < > 8.9   < > 8.9 8.9 8.8*  PROT 6.2*  --  7.0  --   --   --   --   BILITOT 0.5  --  0.3  --   --   --   --   ALKPHOS 49  --  44  --   --   --   --   ALT 22  --  22  --   --   --   --   AST 33  --  30  --   --   --   --   GLUCOSE 94   < > 129*   < > 95 91 92   < > = values in this interval not displayed.   2/12 -   D-Dimer: 0.51>0.39  Ferritin: 61>69  CRP: 4.0>7.3  Infectious Work up: BCx: NG3D UCx: multiple species COVID-19: Positive    Imaging/Diagnostic Tests: 2/12 CXR  IMPRESSION: Patchy bilateral pneumonia.   Gladys Damme, MD 08/05/2019, 6:27 AM PGY-1, [place] Intern pager: [phone], text pages welcome

## 2019-08-05 NOTE — Care Management Important Message (Signed)
Important Message  Patient Details  Name: Michele VANDERHOOF MRN: [id number] Date of Birth: 09/15/1985   Medicare Important Message Given:  Yes - Important Message mailed due to current National Emergency    Verbal consent obtained due to current National Emergency  Relationship to patient: Mother Contact Name: Mother of Patient Call Date: 08/05/19  Time: 1537 Phone: [phone] Outcome: Spoke with contact       Dorena Bodo 08/05/2019, 3:38 PM

## 2019-08-06 LAB — D-DIMER, QUANTITATIVE: D-Dimer, Quant: 0.35 ug/mL-FEU (ref 0.00–0.50)

## 2019-08-06 LAB — BASIC METABOLIC PANEL
Anion gap: 10 (ref 5–15)
BUN: 22 mg/dL — ABNORMAL HIGH (ref 6–20)
CO2: 30 mmol/L (ref 22–32)
Calcium: 9.1 mg/dL (ref 8.9–10.3)
Chloride: 108 mmol/L (ref 98–111)
Creatinine, Ser: 0.92 mg/dL (ref 0.44–1.00)
GFR calc Af Amer: >60 mL/min (ref >60)
GFR calc non Af Amer: >60 mL/min (ref >60)
Glucose, Bld: 82 mg/dL (ref 70–99)
Potassium: 3.5 mmol/L (ref 3.5–5.1)
Sodium: 148 mmol/L — ABNORMAL HIGH (ref 135–145)

## 2019-08-06 LAB — CULTURE, BLOOD (ROUTINE X 2)
Culture: NO GROWTH
Culture: NO GROWTH
Special Requests: ADEQUATE
Special Requests: ADEQUATE

## 2019-08-06 LAB — CBC
HCT: 42.7 % (ref 36.0–46.0)
Hemoglobin: 13.1 g/dL (ref 12.0–15.0)
MCH: 32.3 pg (ref 26.0–34.0)
MCHC: 30.7 g/dL (ref 30.0–36.0)
MCV: 105.4 fL — ABNORMAL HIGH (ref 80.0–100.0)
Platelets: 177 10*3/uL (ref 150–400)
RBC: 4.05 MIL/uL (ref 3.87–5.11)
RDW: 14.5 % (ref 11.5–15.5)
WBC: 7.9 10*3/uL (ref 4.0–10.5)
nRBC: 1.5 % — ABNORMAL HIGH (ref 0.0–0.2)

## 2019-08-06 LAB — HEPATIC FUNCTION PANEL
ALT: 23 U/L (ref 0–44)
AST: 40 U/L (ref 15–41)
Albumin: 2.4 g/dL — ABNORMAL LOW (ref 3.5–5.0)
Alkaline Phosphatase: 46 U/L (ref 38–126)
Bilirubin, Direct: 0.1 mg/dL (ref 0.0–0.2)
Indirect Bilirubin: 0.6 mg/dL (ref 0.3–0.9)
Total Bilirubin: 0.7 mg/dL (ref 0.3–1.2)
Total Protein: 6.2 g/dL — ABNORMAL LOW (ref 6.5–8.1)

## 2019-08-06 LAB — FERRITIN: Ferritin: 88 ng/mL (ref 11–307)

## 2019-08-06 LAB — C-REACTIVE PROTEIN: CRP: 5.7 mg/dL — ABNORMAL HIGH (ref <1.0)

## 2019-08-06 MED ORDER — SODIUM CHLORIDE 0.9 % IV BOLUS
500.0000 mL | Freq: Once | INTRAVENOUS | Status: AC
  Administered 2019-08-06: 500 mL via INTRAVENOUS

## 2019-08-06 NOTE — Progress Notes (Signed)
Family Medicine Teaching Service Daily Progress Note Intern Pager: [phone]  Patient name: Michele [name] Medical record number: [id number] Date of birth: 31-Jul-1985 Age: 34 y.o. Gender: female  Primary Care Provider: Lattie Haw, MD Consultants: None Code Status: Full  Pt Overview and Major Events to Date:  Michele [name] is a 34 y.o. female presenting with fever with and new oxygen requirement. PMH is significant for cerebral palsy, obesity, seizure disorder, lymphedema, nonverbal intellectual disability  Assessment and Plan: Michele [name] a 34 y.o.femalepresenting with fever with and new oxygen requirement. PMH is significant forcerebral palsy, obesity, seizure disorder, lymphedema, nonverbal intellectual disability  COVID-19 Pneumonia:    Tocilizumab dose x1 given 2/14. Oxygen requirement continues at 3.5 L, now with O2 saturations at 100%, turned down to 2L and maintaining at 98%.  Patient afebrile. A.m. labs show WBC 7.9, CRP decreased to 5.7, down from 9.2 yesterday.  D-dimer stable from 0.33 yesterday to 0.35 today.  Patient resting comfortably in bed, non-verbal at baseline. CMP WNL. Completed remdesivir treatment yesterday, on day 6/10 dexamethasone.  If patient continues to require <4L O2 overnight, can go home with home oxygen tomorrow. - attempt to prone - Cont continuous pulse ox to keep O2 sats above 94% on room air - Cont vitals per floor - Cont Telemetry - Follow up BCx -no growth 5 days - Cont Tylenol scheduled - Remdesivir infusion complete (2/12-2/16)  - Cont Decadron (2/12-) for total of 10 days; day 6 of 10 - Cont to follow daily COVID labs (CRP, D-Dimer, and Ferrritin) - Daily CBC and CMP -Daily weights  Hypernatremia Sodium level 148 today, likely due to hypovolemia from poor PO intake. Will give 538mL bolus of NS over 4 hours and recheck tomorrow. See lymphedema below. - Hold lasix today - Daily BMP - 500 mL NS bolus over 4 hours  Thrush.  First noted on  2/13 by patient's mother and her nurse. Likely due to Decadron. Unable to be viewed due to lack of patient cooperation, mother helped show Dr. Owens Shark patient's mouth, which appeared clear.  Decadron to continue for 4 more days, plan to continue nystatin for duration of decadron. -Continue nystatin solution for duration of decadron  Lymphedema Chronic. Stable. On home lasix at 20mg  daily PRN. PO intake either very poor again today, or not charted. Yesterday UOP improved to 950 mL, or 0.4 mL/kg/hr, still lower than would prefer. All bladder scans with urine <120mL. Currently patient on strict I/Os, will continue and request daily weights, qshift bladder scans. - Cont strict I/Os, daily weights, qshift bladder scans - Hold home Lasix  Cerebral Palsy. Chronic, stable. - Cont to monitor for signs of discomfort and spacticity.  Seizure Disorder. Chronic, stable. Takes Depakote 1000mg  BID. Keppra 1200mg  BID. Topamax 150mg  BID, Carnitor 500 mg TID, Banzel susp 5mg  BID. Diastat prn rectal.  - continue home regimen  GERD Chronic, stable.  - Cont Protonix 40mg  daily  Obesity - Reg diet while in hospital - Recommend monitoring weight outpatient but challenging to get weight loss given patient's CP.  FEN/GI: Protonix PPx: Lovenox  Disposition: stable  Subjective:  Patient at baseline cognition, but not eating or drinking per mother.  Objective: Temp:  [97.3 F (36.3 C)-98.2 F (36.8 C)] 97.6 F (36.4 C) (02/17 0750) Pulse Rate:  [59-92] 92 (02/17 0750) Resp:  [16-30] 16 (02/17 0750) BP: (80-118)/(63-85) 92/75 (02/17 0750) SpO2:  [86 %-95 %] 90 % (02/17 0750) Physical Exam: General: Lying in bed, not appearing in  distress Heart: Regular rate and rhythm with no murmurs appreciated Lungs: Satting well on 2 L nasal cannula, no respiratory distress, fine crackles noted bilaterally with diminished air flow at bilateral bases Abdomen: No abdominal pain on palpation Skin: Warm and  dry Extremities: +1 pitting pedal edema  Laboratory: Recent Labs  Lab 08/04/19 0610 08/05/19 0402 08/06/19 0531  WBC 9.5 8.2 7.9  HGB 11.9* 12.2 13.1  HCT 38.6 40.0 42.7  PLT 141* 156 177   Recent Labs  Lab 08/02/19 1229 08/03/19 0715 08/04/19 0610 08/05/19 0402 08/06/19 0531  NA 143   < > 144 145 148*  K 3.6   < > 3.7 3.8 3.5  CL 109   < > 105 107 108  CO2 25   < > 30 30 30   BUN 9   < > 16 20 22*  CREATININE 1.00   < > 0.95 0.88 0.92  CALCIUM 8.9   < > 8.9 8.8* 9.1  PROT 7.0  --   --  5.8* 6.2*  BILITOT 0.3  --   --  0.5 0.7  ALKPHOS 44  --   --  42 46  ALT 22  --   --  19 23  AST 30  --   --  32 40  GLUCOSE 129*   < > 91 92 82   < > = values in this interval not displayed.   2/12 -   D-Dimer: 0.51>0.39>0.33>0.35  Ferritin: 61>69>88  CRP: 4.0>7.3>13.9>9.2>5.7  Infectious Work up: BCx: NG5D UCx: multiple species COVID-19: Positive   Imaging/Diagnostic Tests: 2/12 CXR  IMPRESSION: Patchy bilateral pneumonia.   4/12, MD 08/06/2019, 8:48 AM PGY-1, [facility] Family Medicine FPTS Intern pager: [phone], text pages welcome

## 2019-08-06 NOTE — Progress Notes (Signed)
Patient's Sat O2 dropped to 77% on 2L oxygen, [place]. Oxygen was increased to 3 L. Sat O2 came up to 93%. Will continue to monitor.

## 2019-08-07 DIAGNOSIS — G808 Other cerebral palsy: Secondary | ICD-10-CM

## 2019-08-07 DIAGNOSIS — J1282 Pneumonia due to coronavirus disease 2019: Secondary | ICD-10-CM

## 2019-08-07 DIAGNOSIS — G40909 Epilepsy, unspecified, not intractable, without status epilepticus: Secondary | ICD-10-CM

## 2019-08-07 DIAGNOSIS — J9601 Acute respiratory failure with hypoxia: Secondary | ICD-10-CM

## 2019-08-07 LAB — CBC
HCT: 45.6 % (ref 36.0–46.0)
Hemoglobin: 13.8 g/dL (ref 12.0–15.0)
MCH: 32.5 pg (ref 26.0–34.0)
MCHC: 30.3 g/dL (ref 30.0–36.0)
MCV: 107.3 fL — ABNORMAL HIGH (ref 80.0–100.0)
Platelets: 193 10*3/uL (ref 150–400)
RBC: 4.25 MIL/uL (ref 3.87–5.11)
RDW: 14.6 % (ref 11.5–15.5)
WBC: 7.7 10*3/uL (ref 4.0–10.5)
nRBC: 1.7 % — ABNORMAL HIGH (ref 0.0–0.2)

## 2019-08-07 LAB — VITAMIN B12: Vitamin B-12: 612 pg/mL (ref 180–914)

## 2019-08-07 LAB — FERRITIN: Ferritin: 107 ng/mL (ref 11–307)

## 2019-08-07 LAB — HEPATIC FUNCTION PANEL
ALT: 29 U/L (ref 0–44)
AST: 52 U/L — ABNORMAL HIGH (ref 15–41)
Albumin: 2.7 g/dL — ABNORMAL LOW (ref 3.5–5.0)
Alkaline Phosphatase: 43 U/L (ref 38–126)
Bilirubin, Direct: 0.1 mg/dL (ref 0.0–0.2)
Indirect Bilirubin: 0.5 mg/dL (ref 0.3–0.9)
Total Bilirubin: 0.6 mg/dL (ref 0.3–1.2)
Total Protein: 6.6 g/dL (ref 6.5–8.1)

## 2019-08-07 LAB — BASIC METABOLIC PANEL
Anion gap: 11 (ref 5–15)
BUN: 23 mg/dL — ABNORMAL HIGH (ref 6–20)
CO2: 29 mmol/L (ref 22–32)
Calcium: 9.1 mg/dL (ref 8.9–10.3)
Chloride: 108 mmol/L (ref 98–111)
Creatinine, Ser: 0.98 mg/dL (ref 0.44–1.00)
GFR calc Af Amer: >60 mL/min (ref >60)
GFR calc non Af Amer: >60 mL/min (ref >60)
Glucose, Bld: 84 mg/dL (ref 70–99)
Potassium: 3.4 mmol/L — ABNORMAL LOW (ref 3.5–5.1)
Sodium: 148 mmol/L — ABNORMAL HIGH (ref 135–145)

## 2019-08-07 LAB — D-DIMER, QUANTITATIVE: D-Dimer, Quant: 0.42 ug/mL-FEU (ref 0.00–0.50)

## 2019-08-07 LAB — C-REACTIVE PROTEIN: CRP: 3.1 mg/dL — ABNORMAL HIGH (ref <1.0)

## 2019-08-07 LAB — TSH: TSH: 5.118 u[IU]/mL — ABNORMAL HIGH (ref 0.350–4.500)

## 2019-08-07 MED ORDER — SODIUM CHLORIDE 0.9 % IV BOLUS: 500.0000 mL | Freq: Once | INTRAVENOUS | Status: DC

## 2019-08-07 MED ORDER — ENSURE ENLIVE PO LIQD
237.0000 mL | Freq: Two times a day (BID) | ORAL | Status: DC
  Administered 2019-08-08: 237 mL via ORAL

## 2019-08-07 MED ORDER — SODIUM CHLORIDE 0.9 % IV BOLUS
500.0000 mL | Freq: Once | INTRAVENOUS | Status: AC
  Administered 2019-08-07: 14:00:00 500 mL via INTRAVENOUS

## 2019-08-07 MED ORDER — BOOST / RESOURCE BREEZE PO LIQD CUSTOM
1.0000 | Freq: Two times a day (BID) | ORAL | Status: DC
  Administered 2019-08-07 – 2019-08-10 (×5): 1 via ORAL

## 2019-08-07 MED ORDER — SODIUM CHLORIDE 0.9 % IV BOLUS
500.0000 mL | Freq: Once | INTRAVENOUS | Status: AC
  Administered 2019-08-07: 21:00:00 500 mL via INTRAVENOUS

## 2019-08-07 MED ORDER — ORAL CARE MOUTH RINSE
15.0000 mL | Freq: Two times a day (BID) | OROMUCOSAL | Status: DC
  Administered 2019-08-07 – 2019-08-10 (×7): 15 mL via OROMUCOSAL

## 2019-08-07 NOTE — Progress Notes (Signed)
   08/07/19 0319  Vitals  Pulse Rate 76  ECG Heart Rate 74  Resp 17  Oxygen Therapy  SpO2 (!) 79 %  O2 Device Room Air (Patient had taken off O2 [place].)  MEWS Score  MEWS Temp 0  MEWS Systolic 1  MEWS Pulse 0  MEWS RR 0  MEWS LOC 0  MEWS Score 1  MEWS Score Color Green   SpO2 increased to 88-90% on O2 2L [place]. Patient restless and SpO2 not picking up well. This RN turned off light and exited room to allow patient to calm down. Will continue to monitor.

## 2019-08-07 NOTE — Progress Notes (Signed)
   08/06/19 2014  MEWS Score  Temp 98.5 F (36.9 C)  BP (!) 149/114 (Patient moving around.)  Pulse Rate (!) 54  ECG Heart Rate (!) 55  Resp (!) 29  SpO2 94 %  O2 Device Nasal Cannula  O2 Flow Rate (L/min) 3 L/min  MEWS Score  MEWS Temp 0  MEWS Systolic 0  MEWS Pulse 0  MEWS RR 2  MEWS LOC 0  MEWS Score 2  MEWS Score Color Yellow  MEWS Assessment  Is this an acute change? No   This RN attempted to measure BP in right arm and right leg a few times. Patient restless every time BP cuff inflates, despite reassurance given. Only BP result given is 149/114. Patient intermittently moaning, otherwise no distress noted. BP cuff left on patient to attempt to get accurate BP when RN not in room, making patient anxious. Patient's mother in room with patient, aware of current status and plan. No questions. Mother verbalized understanding. Patient anxious and uncooperative with assessment and meds. Patient's mother declined Ensure. Patient repositioning in bed from side to side at this time. Call light within reach. Will continue to monitor.

## 2019-08-07 NOTE — Progress Notes (Signed)
   08/07/19 0418  MEWS Score  Pulse Rate 68  ECG Heart Rate 72  Resp (!) 21  SpO2 (!) 84 %  O2 Device Nasal Cannula  Patient Activity (if Appropriate) In bed  O2 Flow Rate (L/min) 2 L/min  MEWS Score  MEWS Temp 0  MEWS Systolic 1  MEWS Pulse 0  MEWS RR 1  MEWS LOC 0  MEWS Score 2  MEWS Score Color Yellow  MEWS Assessment  Is this an acute change? No  Provider Notification  Provider Name/Title Clent Ridges, MD  Date Provider Notified 08/07/19  Time Provider Notified [phone]  Notification Type Page  Notification Reason Other (Comment) (Patient restless. SpO2 decreased on O2 2L [place].)  Response No new orders  Date of Provider Response 08/07/19  Time of Provider Response 0430 (MD in to see patient.)   Tylenol given PO. SpO2 sensor changed. Patient more calm at this time. SpO2 back up to 92-96% on O2 3L [place]. Will continue to monitor.

## 2019-08-07 NOTE — Progress Notes (Signed)
Family Medicine Teaching Service Daily Progress Note Intern Pager: [phone]  Patient name: Michele [name] Medical record number: [id number] Date of birth: [birth year] Age: 34 y.o. Gender: female  Primary Care Provider: Lattie Haw, MD Consultants: None Code Status: Full  Pt Overview and Major Events to Date:  RENNEE [name] is a 34 y.o. female presenting with fever with and new oxygen requirement. PMH is significant for cerebral palsy, obesity, seizure disorder, lymphedema, nonverbal intellectual disability  Assessment and Plan: Michele Peralta Fordis a 34 y.o.femalepresenting with fever with and new oxygen requirement. PMH is significant forcerebral palsy, obesity, seizure disorder, lymphedema, nonverbal intellectual disability  COVID-19 Pneumonia:    SpO2 dropped to 70s, however erroneous read as patient did not have pulse ox on finger. While in the room with patient, pulse ox now on foot and witnessed desat to mid 80s with nasal cannula appropriately applied. Increased to [place] from 3L to 4L.  Expect we can wean patient down further tomorrow and she can go home on oxygen. Patient afebrile. A.m. labs show WBC 7.7, CRP decreased to 3.1, down from 5.7 yesterday.  D-dimer elevated from 0.33 yesterday to 0.42 today.  Patient resting comfortably in bed, non-verbal at baseline. CMP WNL. Tocilizumab dose x1 given 2/14. Completed remdesivir treatment 2/16, on day 7/10 dexamethasone.  - attempt to prone - Cont continuous pulse ox to keep O2 sats above 94% on room air - Cont vitals per floor - Cont Telemetry - BCx -no growth 5 days - Cont Tylenol scheduled - Remdesivir infusion complete (2/12-2/16)  - Cont Decadron (2/12-) for total of 10 days; day 7 of 10 - Cont to follow daily COVID labs (CRP, D-Dimer, and Ferrritin) - Daily CBC and CMP - Daily weights -PT/OT eval and treat  Hypernatremia Sodium level 148 2/17 and today after 500 mL bolus. Likely due to hypovolemia from poor PO intake. Patient has  lymphedema and home dose of lasix 20 mg, lasix currently held. Wt at admission was 222lbs, today 206. Despite baseline pedal edema, likely patient is overall volume depleted and could stand another 500 mL NS bolus today. 2/16 UOP improved to 950 mL, or 0.4 mL/kg/hr, still lower than would prefer, however [place] not charted, so expected UOP is greater than this. Creatinine at baseline 0.98. 2/17 UOP charted as 600 mL. All bladder scans with urine <192mL. Improved PO intake with 30 mL.  - Hold lasix today - 500 mL bolus over 4 hours - Daily BMP  MCV elevation without anemia Patient had normal MCV on 07/09/19 at 97, since admission has been elevated to 105-108 since admission on 08/01/19. No anemia, hgb 12-13 WNL. Highly unlikely to be alcohol related due to cognitive status and close care by mother. Plan ot work up with vit b12, folate, and TSH labs. - f/u vit b12, folate, TSH  Thrush.  First noted on 2/13 by patient's mother and her nurse. Likely due to Decadron. Unable to be viewed due to lack of patient cooperation, mother helped show Dr. Owens Shark patient's mouth, which appeared clear.  Decadron to continue for 3 more days, plan to continue nystatin for duration of decadron. -Continue nystatin solution for duration of decadron  Lymphedema Chronic. Stable. On home lasix at 20mg  daily PRN. PO intake either very poor again today, or not charted. Currently patient on strict I/Os, will continue and request daily weights, qshift bladder scans. - Cont strict I/Os, daily weights, qshift bladder scans - Hold home Lasix - See hypernatremia treatment above  Cerebral  Palsy. Chronic, stable. - Cont to monitor for signs of discomfort and spacticity.  Seizure Disorder. Chronic, stable. Takes Depakote 1000mg  BID. Keppra 1200mg  BID. Topamax 150mg  BID, Carnitor 500 mg TID, Banzel susp 5mg  BID. Diastat prn rectal.  - continue home regimen  GERD Chronic, stable.  - Cont Protonix 40mg  daily  Obesity - Reg diet  while in hospital - Recommend monitoring weight outpatient but challenging to get weight loss given patient's CP.  FEN/GI: Protonix PPx: Lovenox  Disposition: stable  Subjective:  Patient much more alert, moving about, overall appears much brighter than previous days  Objective: Temp:  [97.7 F (36.5 C)-98.5 F (36.9 C)] 97.8 F (36.6 C) (02/18 0435) Pulse Rate:  [44-81] 55 (02/18 0600) Resp:  [14-33] 21 (02/18 0600) BP: (88-149)/(43-114) 132/92 (02/18 0435) SpO2:  [79 %-100 %] 97 % (02/18 0600) Weight:  [93.6 kg] 93.6 kg (02/18 0435) Physical Exam: General: Lying in bed, not appearing in distress Heart: Regular rate and rhythm with no murmurs appreciated Lungs: Satting well on 4 L nasal cannula, no respiratory distress, diminished air flow at bilateral bases Abdomen: No abdominal pain on palpation Skin: Warm and dry Extremities: +1 pitting pedal edema  Laboratory: Recent Labs  Lab 08/05/19 0402 08/06/19 0531 08/07/19 0500  WBC 8.2 7.9 7.7  HGB 12.2 13.1 13.8  HCT 40.0 42.7 45.6  PLT 156 177 193   Recent Labs  Lab 08/05/19 0402 08/06/19 0531 08/07/19 0500  NA 145 148* 148*  K 3.8 3.5 3.4*  CL 107 108 108  CO2 30 30 29   BUN 20 22* 23*  CREATININE 0.88 0.92 0.98  CALCIUM 8.8* 9.1 9.1  PROT 5.8* 6.2* 6.6  BILITOT 0.5 0.7 0.6  ALKPHOS 42 46 43  ALT 19 23 29   AST 32 40 52*  GLUCOSE 92 82 84   2/12 -   D-Dimer: 0.51>0.39>0.33>0.35>0.42  Ferritin: 61>69>88>107  CRP: 4.0>7.3>13.9>9.2>5.7>3.1  Infectious Work up: BCx: NG5D UCx: multiple species COVID-19: Positive   Imaging/Diagnostic Tests: 2/12 CXR  IMPRESSION: Patchy bilateral pneumonia.   08/07/19, MD 08/07/2019, 8:06 AM PGY-1, [facility] Family Medicine FPTS Intern pager: [phone], text pages welcome

## 2019-08-07 NOTE — Progress Notes (Addendum)
MEWS/VS Documentation      08/07/2019 0700 08/07/2019 0807 08/07/2019 1035 08/07/2019 1209   MEWS Score:  0  1  0  2   MEWS Score Color:  Green  Green  Green  Yellow   Resp:  --  --  15  (!) 21   Pulse:  --  --  77  73   BP:  --  (!) 111/56  --  (!) 89/62   Temp:  --  97.9 F (36.6 C)  --  97.8 F (36.6 C)   O2 Device:  --  Nasal Cannula  Nasal Cannula  Nasal Cannula   O2 Flow Rate (L/min):  --  3 L/min  3 L/min  3 L/min   Level of Consciousness:  --  Alert  --  Alert    Patient;s BP low 89/62 (hard to measure due to patient being restless when checking VS). MD aware. Per order patient will receive NS 500 ml bolus. Will continue to monitor.

## 2019-08-07 NOTE — Progress Notes (Signed)
Nutrition Follow-up   RD working remotely.  DOCUMENTATION CODES:   Morbid obesity  INTERVENTION:  Provide Boost Breeze po BID, each supplement provides 250 kcal and 9 grams of protein.  Provide Ensure Enlive po BID, each supplement provides 350 kcal and 20 grams of protein.  Encourage adequate PO intake.  NUTRITION DIAGNOSIS:   Increased nutrient needs related to acute illness(COVID) as evidenced by estimated needs; ongoing  GOAL:   Patient will meet greater than or equal to 90% of their needs; progressing  MONITOR:   PO intake, Supplement acceptance, Skin, Weight trends, Labs, I & O's  REASON FOR ASSESSMENT:   Consult Assessment of nutrition requirement/status  ASSESSMENT:   34 y.o. female presenting with fever with urinary changes and new oxygen requirement. PMH is significant for cerebral palsy, obesity, seizure disorder, lymphedema, nonverbal intellectual disability. Pt hypoxic in ED with bilateral CXR infiltrates. COVID positive. MD suspects COVID PNA.  Meal completion 75%. Pt currently has Ensure ordered with varied consumption. RD to modify nutritional supplement orders and order Boost Breeze to help aid in caloric and protein needs.   Labs and medications reviewed. Sodium elevated at 148. IV fluids given. Lasix on hold.   Diet Order:   Diet Order            Diet Heart Room service appropriate? Yes; Fluid consistency: Thin  Diet effective now              EDUCATION NEEDS:   Not appropriate for education at this time  Skin:  Skin Assessment: Reviewed RN Assessment  Last BM:  2/17  Height:   Ht Readings from Last 1 Encounters:  08/01/19 4\' 11"  (1.499 m)    Weight:   Wt Readings from Last 1 Encounters:  08/07/19 93.6 kg    BMI:  Body mass index is 41.68 kg/m.  Estimated Nutritional Needs:   Kcal:  1500-1800  Protein:  80-100 grams  Fluid:  >/= 1.5 L/day   08/09/19, MS, RD, LDN RD pager number/after hours weekend pager  number on Amion.

## 2019-08-08 DIAGNOSIS — G800 Spastic quadriplegic cerebral palsy: Secondary | ICD-10-CM

## 2019-08-08 DIAGNOSIS — U071 COVID-19: Secondary | ICD-10-CM

## 2019-08-08 LAB — FOLATE RBC
Folate, Hemolysate: 286 ng/mL
Folate, RBC: 739 ng/mL (ref >498)
Hematocrit: 38.7 % (ref 34.0–46.6)

## 2019-08-08 LAB — HEPATIC FUNCTION PANEL
ALT: 34 U/L (ref 0–44)
AST: 73 U/L — ABNORMAL HIGH (ref 15–41)
Albumin: 2.1 g/dL — ABNORMAL LOW (ref 3.5–5.0)
Alkaline Phosphatase: 32 U/L — ABNORMAL LOW (ref 38–126)
Bilirubin, Direct: 0.3 mg/dL — ABNORMAL HIGH (ref 0.0–0.2)
Indirect Bilirubin: 0.3 mg/dL (ref 0.3–0.9)
Total Bilirubin: 0.6 mg/dL (ref 0.3–1.2)
Total Protein: 5 g/dL — ABNORMAL LOW (ref 6.5–8.1)

## 2019-08-08 LAB — CBC
HCT: 41.6 % (ref 36.0–46.0)
Hemoglobin: 12.3 g/dL (ref 12.0–15.0)
MCH: 33.3 pg (ref 26.0–34.0)
MCHC: 29.6 g/dL — ABNORMAL LOW (ref 30.0–36.0)
MCV: 112.7 fL — ABNORMAL HIGH (ref 80.0–100.0)
Platelets: 165 10*3/uL (ref 150–400)
RBC: 3.69 MIL/uL — ABNORMAL LOW (ref 3.87–5.11)
RDW: 14.8 % (ref 11.5–15.5)
WBC: 7.6 10*3/uL (ref 4.0–10.5)
nRBC: 1.8 % — ABNORMAL HIGH (ref 0.0–0.2)

## 2019-08-08 LAB — BASIC METABOLIC PANEL
Anion gap: 10 (ref 5–15)
BUN: 21 mg/dL — ABNORMAL HIGH (ref 6–20)
CO2: 24 mmol/L (ref 22–32)
Calcium: 8.3 mg/dL — ABNORMAL LOW (ref 8.9–10.3)
Chloride: 115 mmol/L — ABNORMAL HIGH (ref 98–111)
Creatinine, Ser: 0.98 mg/dL (ref 0.44–1.00)
GFR calc Af Amer: >60 mL/min (ref >60)
GFR calc non Af Amer: >60 mL/min (ref >60)
Glucose, Bld: 82 mg/dL (ref 70–99)
Potassium: 3.8 mmol/L (ref 3.5–5.1)
Sodium: 149 mmol/L — ABNORMAL HIGH (ref 135–145)

## 2019-08-08 LAB — D-DIMER, QUANTITATIVE: D-Dimer, Quant: 0.38 ug/mL-FEU (ref 0.00–0.50)

## 2019-08-08 LAB — T4, FREE: Free T4: 0.97 ng/dL (ref 0.61–1.12)

## 2019-08-08 LAB — FERRITIN: Ferritin: 93 ng/mL (ref 11–307)

## 2019-08-08 LAB — C-REACTIVE PROTEIN: CRP: 1.1 mg/dL — ABNORMAL HIGH (ref <1.0)

## 2019-08-08 MED ORDER — SODIUM CHLORIDE 0.9 % IV BOLUS
500.0000 mL | Freq: Once | INTRAVENOUS | Status: AC
  Administered 2019-08-08: 500 mL via INTRAVENOUS

## 2019-08-08 NOTE — Progress Notes (Signed)
Occupational Therapy Evaluation Patient Details Name: Michele [name] MRN: [id number] DOB: 15-Dec-1985 Today's Date: 08/08/2019    History of Present Illness Michele [name] a 34 y.o.femalepresenting with fever with and new oxygen requirement. PMH is significant forcerebral palsy, obesity, seizure disorder, lymphedema, nonverbal intellectual disability.  Positive for Covid PNA.    Clinical Impression   PTA, pt lived with mother and sister. Pt's mother is primary caregiver and assists extensively with ADLs. At baseline, pt was able to ambulate short household distances. Currently, pt requires Mod A + 2 for bed mobility, sit to stand transfers, and mobility to sink with trial of HHA vs RW. Pt with difficulty following directions and implementing safety techniques. Pt Total A for toileting hygiene after bowel incontinence. Pt on 1 L O2 at rest, but required 3 L O2 after exertion with stats dropping to 80s. Attempted to instruct pt in pursed lip breathing, but pt with difficulty following instructions. Family reports preference to take pt home to familiar environment, so recommend HHOT to follow up and ensure safety in the home. Will continue to follow acutely.     Follow Up Recommendations  Home health OT;Supervision/Assistance - 24 hour(Family preference for taking pt home, declines SNF)    Equipment Recommendations  Other (comment)(Defer to next venue)    Recommendations for Other Services       Precautions / Restrictions Precautions Precautions: Fall;Other (comment)(Airborne) Restrictions Weight Bearing Restrictions: No      Mobility Bed Mobility Overal bed mobility: Needs Assistance Bed Mobility: Rolling;Sidelying to Sit;Sit to Supine Rolling: Mod assist;+2 for physical assistance Sidelying to sit: Mod assist;+2 for physical assistance   Sit to supine: Max assist;+2 for physical assistance   General bed mobility comments: Pt was soiled heavily with BM on arrival.  Assisted mom in  cleaning pt.  Then pt mod assist to come to EOB needing assist with LEs and with trunk elevation.  Also needed max assist to lie back down with assist for LEs and for trunk.    Transfers Overall transfer level: Needs assistance Equipment used: Rolling walker (2 wheeled);2 person hand held assist Transfers: Sit to/from Stand Sit to Stand: Min assist;Mod assist;+2 physical assistance         General transfer comment: Pt was able to stand from the bed with min to mod assist with steadyijg assist needed as pt keeps knees flexed slightly and is unsteady on her feet.  Initially pt was holding onto PT hands but could not progress walking therefore gave her RW and she tends to prop on elbows when given the RW.      Balance Overall balance assessment: Needs assistance;History of Falls Sitting-balance support: Feet supported;Bilateral upper extremity supported Sitting balance-Leahy Scale: Poor Sitting balance - Comments: relies on UE support    Standing balance support: Bilateral upper extremity supported;During functional activity Standing balance-Leahy Scale: Poor Standing balance comment: relies on UE suport and external support of 2 people .                           ADL either performed or assessed with clinical judgement   ADL Overall ADL's : Needs assistance/impaired Eating/Feeding: Minimal assistance;Bed level   Grooming: Minimal assistance;Wash/dry hands;Wash/dry face   Upper Body Bathing: Maximal assistance;Bed level   Lower Body Bathing: Total assistance;Bed level   Upper Body Dressing : Maximal assistance;Bed level   Lower Body Dressing: Maximal assistance;Bed level   Toilet Transfer: +2 for physical assistance Toilet  Transfer Details (indicate cue type and reason): Suspect Mod A+ 2 based on mobility assessed. Pt mostly incontinent Toileting- Clothing Manipulation and Hygiene: Total assistance;Bed level       Functional mobility during ADLs: +2 for physical  assistance General ADL Comments: Pt's mother assists with ADLs extensively at baseline. However, mother reports pt is requiring more physical assistance with therapy today     Vision         Perception     Praxis      Pertinent Vitals/Pain Pain Assessment: No/denies pain     Hand Dominance Right   Extremity/Trunk Assessment Upper Extremity Assessment Upper Extremity Assessment: Generalized weakness           Communication Communication Communication: Other (comment)(pt non verbal)   Cognition Arousal/Alertness: Awake/alert Behavior During Therapy: Restless;Flat affect;Anxious Overall Cognitive Status: History of cognitive impairments - at baseline                                     General Comments  Pt on 1 L O2 at rest, but required 3 L O2 with activity, desat to mid 80s    Exercises Exercises: Other exercises Other Exercises Other Exercises: Pursed lip breathing, pt unable to follow instructions for this   Shoulder Instructions      Home Living Family/patient expects to be discharged to:: Private residence Living Arrangements: Parent Available Help at Discharge: Family;Available 24 hours/day Type of Home: House Home Access: Ramped entrance     Home Layout: One level     Bathroom Shower/Tub: Occupational psychologist: Standard Bathroom Accessibility: Yes   Home Equipment: Environmental consultant - 2 wheels;Hospital bed;Wheelchair - manual;Shower seat(hoyer lift)   Additional Comments: Michele [name] lift is used if pt falls or for when pt is weak, but normally transfers with mother's A       Prior Functioning/Environment Level of Independence: Needs assistance  Gait / Transfers Assistance Needed: Amb short household distances  ADL's / Homemaking Assistance Needed: Mom does all bathing and dressing. uses Depends for toileting            OT Problem List: Decreased strength;Decreased activity tolerance;Impaired balance (sitting and/or  standing);Decreased coordination;Decreased cognition;Decreased knowledge of use of DME or AE;Decreased safety awareness;Cardiopulmonary status limiting activity      OT Treatment/Interventions: Self-care/ADL training;Therapeutic exercise;Energy conservation;DME and/or AE instruction;Therapeutic activities;Patient/family education    OT Goals(Current goals can be found in the care plan section) Acute Rehab OT Goals Patient Stated Goal: to go home per mom OT Goal Formulation: With patient/family Time For Goal Achievement: 08/22/19 Potential to Achieve Goals: Fair ADL Goals Pt Will Perform Eating: with set-up;sitting Pt Will Perform Grooming: with supervision;sitting Pt Will Perform Upper Body Dressing: with supervision;sitting Additional ADL Goal #1: Pt to demonstrate ability to sit EOB with no more than Min A for 5 minutes in order to improve ADL participation. Additional ADL Goal #2: Pt with demonstrate ability to follow one-step commands 75% of the time in order to facilitate improved safety during ADLs and transfers.  OT Frequency: Min 3X/week   Barriers to D/C:            Co-evaluation PT/OT/SLP Co-Evaluation/Treatment: Yes Reason for Co-Treatment: Complexity of the patient's impairments (multi-system involvement);For patient/therapist safety   OT goals addressed during session: ADL's and self-care      AM-PAC OT "6 Clicks" Daily Activity     Outcome Measure Help from another person  eating meals?: A Little Help from another person taking care of personal grooming?: A Little Help from another person toileting, which includes using toliet, bedpan, or urinal?: Total Help from another person bathing (including washing, rinsing, drying)?: Total Help from another person to put on and taking off regular upper body clothing?: A Lot Help from another person to put on and taking off regular lower body clothing?: Total 6 Click Score: 11   End of Session Equipment Utilized During  Treatment: Gait belt;Rolling walker;Oxygen Nurse Communication: Mobility status  Activity Tolerance: Patient limited by fatigue Patient left: in bed;with family/visitor present  OT Visit Diagnosis: Unsteadiness on feet (R26.81);Muscle weakness (generalized) (M62.81)                Time: 4859-2763 OT Time Calculation (min): 30 min Charges:  OT General Charges $OT Visit: 1 Visit OT Evaluation $OT Eval Moderate Complexity: 1 Mod  Lorre Munroe, OTR/L  Lorre Munroe 08/08/2019, 2:11 PM

## 2019-08-08 NOTE — Progress Notes (Addendum)
@   2045:  Patient's blood pressure low 80s.  Paged family medicine intern Clent Ridges.  Clent Ridges ordered bolus.  @ 0100:  Pressure back over 90s.  Patient sleeping.

## 2019-08-08 NOTE — Progress Notes (Signed)
Family Medicine Teaching Service Daily Progress Note Intern Pager: [phone]  Patient name: Michele [name] Medical record number: [id number] Date of birth: 1985-09-16 Age: 34 y.o. Gender: female  Primary Care Provider: Lattie Haw, MD Consultants: None Code Status: Full  Pt Overview and Major Events to Date:  Michele [name] is a 34 y.o. female presenting with fever with and new oxygen requirement. PMH is significant for cerebral palsy, obesity, seizure disorder, lymphedema, nonverbal intellectual disability  Assessment and Plan: Michele [name] a 34 y.o.femalepresenting with fever with and new oxygen requirement. PMH is significant forcerebral palsy, obesity, seizure disorder, lymphedema, nonverbal intellectual disability  COVID-19 Pneumonia:    While I was in room with patient, spO2 99% on 3L [place], decreased to 2L and stable, decreased to 1L and SpO2 dropped to 89%. Patient will like require 1-3L O2 via [place] for several days, plan to get DME home O2 for discharge tomorrow. Overall patient much improved, eating, much brighter and sitting up/active. Patient afebrile. A.m. labs show WBC 7.6, CRP decreased to 1.9, down from 3.1 yesterday.  D-dimer stable from 0.33 yesterday to 0.38 today.  Patient resting comfortably in bed, non-verbal at baseline. CMP WNL. Tocilizumab dose x1 given 2/14. Completed remdesivir treatment 2/16, on day 8/10 dexamethasone.  - attempt to prone - Cont continuous pulse ox to keep O2 sats above 94% on room air - Cont vitals per floor - Cont Telemetry - BCx -no growth 5 days - Cont Tylenol scheduled - Remdesivir infusion complete (2/12-2/16)  - Cont Decadron (2/12-) for total of 10 days; day 7 of 10 - Cont to follow daily COVID labs (CRP, D-Dimer, and Ferrritin) - Daily CBC and CMP - Daily weights - PT/OT eval and treat  Hypernatremia Sodium level 148 2/17 and 149 today after 500 mL bolusx2 on 2/17, 2/18. Likely due to hypovolemia from poor PO intake for 7 days,  although seen eating regular Lays potato chips today. Patient improved to 120 mL PO yesterday, UOP not charted. Patient has lymphedema and home dose of lasix 20 mg, lasix currently held. Wt at admission was 222lbs, 206 lbs 2/18. Despite baseline pedal edema, likely patient is overall volume depleted and could stand another 500 mL NS bolus today. Suspect UOP greater than charted (none). Creatinine at baseline 0.98. All bladder scans with urine <137mL.  - Hold lasix today - 500 mL bolus over 4 hours - Daily BMP  MCV elevation without anemia  Euthryoid Sick Syndrome Patient had normal MCV on 07/09/19 at 97, since admission has been elevated to 105-112 since admission on 08/01/19. No anemia, hgb 12-13 WNL. Vit B12 WNL, TSH elevated to 5.118, FT4 WNL. Patient has history of hypothyroidism and previously was treated with synthroid, but no longer on it. Most likely diagnosis is euthyroid sick syndrome as patient had normal MCV and prior to COVID-19. - F/U CBC, thyroid function panel as outpatient  [place].  First noted on 2/13 by patient's mother and her nurse. Likely due to Decadron. Unable to be viewed due to lack of patient cooperation, mother helped show Dr. Owens Shark patient's mouth, which appeared clear.  Decadron to continue for 2 more days, plan to continue nystatin for duration of decadron. -Continue nystatin solution for duration of decadron (2 more days)  Lymphedema Chronic. Stable. On home lasix at 20mg  daily PRN. PO intake either very poor again today, or not charted. Currently patient on strict I/Os, will continue and request daily weights, qshift bladder scans. - Cont strict I/Os, daily weights,  qshift bladder scans - Hold home Lasix - See hypernatremia treatment above  Cerebral Palsy. Chronic, stable. - Cont to monitor for signs of discomfort and spacticity.  Seizure Disorder. Chronic, stable. Takes Depakote 1000mg  BID. Keppra 1200mg  BID. Topamax 150mg  BID, Carnitor 500 mg TID, Banzel susp  5mg  BID. Diastat prn rectal.  - continue home regimen  GERD Chronic, stable.  - Cont Protonix 40mg  daily  Obesity - Reg diet while in hospital - Recommend monitoring weight outpatient but challenging to get weight loss given patient's CP.  FEN/GI: Protonix PPx: Lovenox  Disposition: stable  Subjective:  Patient much more alert, moving about, eating chips today and jello, improved PO fluid intake  Objective: Temp:  [97.6 F (36.4 C)-98.2 F (36.8 C)] 98.2 F (36.8 C) (02/18 2023) Pulse Rate:  [60-77] 66 (02/19 0421) Resp:  [15-21] 18 (02/19 0421) BP: (88-99)/(60-70) 92/70 (02/19 0119) SpO2:  [90 %-98 %] 92 % (02/19 0421) Physical Exam: General: Lying in bed, not appearing in distress Heart: Regular rate and rhythm with no murmurs appreciated Lungs: Satting well on 2 L nasal cannula, no respiratory distress, diminished air flow at bilateral bases Abdomen: No abdominal pain on palpation Skin: Warm and dry Extremities: +1 pitting pedal edema  Laboratory: Recent Labs  Lab 08/06/19 0531 08/07/19 0500 08/08/19 0715  WBC 7.9 7.7 7.6  HGB 13.1 13.8 12.3  HCT 42.7 45.6 41.6  PLT 177 193 165   Recent Labs  Lab 08/06/19 0531 08/07/19 0500 08/08/19 0715  NA 148* 148* 149*  K 3.5 3.4* 3.8  CL 108 108 115*  CO2 30 29 24   BUN 22* 23* 21*  CREATININE 0.92 0.98 0.98  CALCIUM 9.1 9.1 8.3*  PROT 6.2* 6.6 5.0*  BILITOT 0.7 0.6 0.6  ALKPHOS 46 43 32*  ALT 23 29 34  AST 40 52* 73*  GLUCOSE 82 84 82   2/12 -   D-Dimer: 0.51>0.39>0.33>0.35>0.42  Ferritin: 61>69>88>107  CRP: 4.0>7.3>13.9>9.2>5.7>3.1  Infectious Work up: BCx: NG5D UCx: multiple species COVID-19: Positive   Imaging/Diagnostic Tests: 2/12 CXR  IMPRESSION: Patchy bilateral pneumonia.   08/08/19, MD 08/08/2019, 9:13 AM PGY-1, [facility] Family Medicine FPTS Intern pager: [phone], text pages welcome

## 2019-08-08 NOTE — Evaluation (Signed)
Physical Therapy Evaluation Patient Details Name: Michele [name] MRN: [id number] DOB: [birth year] Today's Date: 08/08/2019   History of Present Illness  Chiyo Fay Fordis a 34 y.o.femalepresenting with fever with and new oxygen requirement. PMH is significant forcerebral palsy, obesity, seizure disorder, lymphedema, nonverbal intellectual disability.  Positive for Covid PNA.   Clinical Impression  Pt admitted with above diagnosis. Pt was able to ambulate with RW versus bil HHA with mod assist of 2. Mom present and states that she is taking pt home no matter what as her daughter is a Engineer, civil (consulting) and helps her with pt as well.  Mom has all needed equipment and would benefit from HHPT and HHOT.   Pt currently with functional limitations due to the deficits listed below (see PT Problem List). Pt will benefit from skilled PT to increase their independence and safety with mobility to allow discharge to the venue listed below.      Follow Up Recommendations Home health PT;Supervision/Assistance - 24 hour    Equipment Recommendations  None recommended by PT    Recommendations for Other Services       Precautions / Restrictions Precautions Precautions: Fall;Other (comment)(Airborne) Restrictions Weight Bearing Restrictions: No      Mobility  Bed Mobility Overal bed mobility: Needs Assistance Bed Mobility: Rolling;Sidelying to Sit;Sit to Supine Rolling: Mod assist;+2 for physical assistance Sidelying to sit: Mod assist;+2 for physical assistance   Sit to supine: Max assist;+2 for physical assistance   General bed mobility comments: Pt was soiled heavily with BM on arrival.  Assisted mom in cleaning pt.  Then pt mod assist to come to EOB needing assist with LEs and with trunk elevation.  Also needed max assist to lie back down with assist for LEs and for trunk.    Transfers Overall transfer level: Needs assistance Equipment used: Rolling walker (2 wheeled);2 person hand held assist Transfers:  Sit to/from Stand Sit to Stand: Min assist;Mod assist;+2 physical assistance         General transfer comment: Pt was able to stand from the bed with min to mod assist with steadyijg assist needed as pt keeps knees flexed slightly and is unsteady on her feet.  Initially pt was holding onto PT hands but could not progress walking therefore gave her RW and she tends to prop on elbows when given the RW.    Ambulation/Gait Ambulation/Gait assistance: Mod assist;+2 physical assistance Gait Distance (Feet): 12 Feet Assistive device: Rolling walker (2 wheeled);2 person hand held assist Gait Pattern/deviations: Decreased stride length;Decreased step length - right;Decreased step length - left;Shuffle;Leaning posteriorly;Staggering left;Staggering right;Trunk flexed;Wide base of support   Gait velocity interpretation: <1.31 ft/sec, indicative of household ambulator General Gait Details: Pt initially using bil therapists hands for holding onto but could not advance feet.  then pt did start ambulating once RW in front of pt and cues to start walking. However needed constant cues and encouragement to keep going. Mom turned water on and pt did walk to sink.  Pt propped on sink and got a cup and spoon and was drinking from the spoon.  Pt appeared to be getting weak therefore asked pt to walk back.  Pt walked back with mod assist holding therapists hands and needing mod assist to steady pt overall.  Pt generally unsteady on her feet with flexed posture and flexed knees with incr flexion as she fatigued.  Was able to make it back to bed with mod assist of 2.    Stairs  Wheelchair Mobility    Modified Rankin (Stroke Patients Only)       Balance Overall balance assessment: Needs assistance;History of Falls Sitting-balance support: Feet supported;Bilateral upper extremity supported Sitting balance-Leahy Scale: Poor Sitting balance - Comments: relies on UE support    Standing balance  support: Bilateral upper extremity supported;During functional activity Standing balance-Leahy Scale: Poor Standing balance comment: relies on UE suport and external support of 2 people .                             Pertinent Vitals/Pain Pain Assessment: No/denies pain    Home Living Family/patient expects to be discharged to:: Private residence Living Arrangements: Parent Available Help at Discharge: Family;Available 24 hours/day Type of Home: House Home Access: Ramped entrance     Home Layout: One level Home Equipment: Walker - 2 wheels;Hospital bed;Wheelchair - manual;Shower seat(hoyer lift) Additional Comments: Harrel Lemon lift is used if pt falls or for when pt is weak, but normally transfers with mother's A     Prior Function Level of Independence: Needs assistance   Gait / Transfers Assistance Needed: Amb short household distances   ADL's / Homemaking Assistance Needed: Mom does all bathing and dressing. uses Depends for toileting        Hand Dominance   Dominant Hand: Right    Extremity/Trunk Assessment   Upper Extremity Assessment Upper Extremity Assessment: Generalized weakness    Lower Extremity Assessment Lower Extremity Assessment: RLE deficits/detail;LLE deficits/detail RLE Deficits / Details: spastic hemiplegia RLE Coordination: decreased gross motor LLE Deficits / Details: spastic hemiplegia LLE Coordination: decreased gross motor    Cervical / Trunk Assessment Cervical / Trunk Assessment: Kyphotic  Communication   Communication: Other (comment)(pt non verbal)  Cognition Arousal/Alertness: Awake/alert Behavior During Therapy: Restless;Flat affect;Anxious Overall Cognitive Status: History of cognitive impairments - at baseline                                        General Comments General comments (skin integrity, edema, etc.): VSS with pt needing 3L O2 with activity and was on 1L at rest.  Desat to mid 80's.       Exercises     Assessment/Plan    PT Assessment Patient needs continued PT services  PT Problem List Decreased activity tolerance;Decreased balance;Decreased mobility;Decreased knowledge of use of DME;Decreased safety awareness;Decreased coordination;Decreased strength;Decreased cognition;Decreased knowledge of precautions;Cardiopulmonary status limiting activity       PT Treatment Interventions DME instruction;Gait training;Functional mobility training;Therapeutic activities;Therapeutic exercise;Balance training;Patient/family education    PT Goals (Current goals can be found in the Care Plan section)  Acute Rehab PT Goals Patient Stated Goal: to go home per mom PT Goal Formulation: With family Time For Goal Achievement: 08/22/19 Potential to Achieve Goals: Good    Frequency Min 3X/week   Barriers to discharge        Co-evaluation PT/OT/SLP Co-Evaluation/Treatment: Yes(OT present during part of PT eval) Reason for Co-Treatment: Complexity of the patient's impairments (multi-system involvement);For patient/therapist safety PT goals addressed during session: Mobility/safety with mobility OT goals addressed during session: ADL's and self-care       AM-PAC PT "6 Clicks" Mobility  Outcome Measure Help needed turning from your back to your side while in a flat bed without using bedrails?: A Lot Help needed moving from lying on your back to sitting on the side of a flat  bed without using bedrails?: A Lot Help needed moving to and from a bed to a chair (including a wheelchair)?: A Lot Help needed standing up from a chair using your arms (e.g., wheelchair or bedside chair)?: A Lot Help needed to walk in hospital room?: A Lot Help needed climbing 3-5 steps with a railing? : Total 6 Click Score: 11    End of Session Equipment Utilized During Treatment: Gait belt;Oxygen Activity Tolerance: Patient limited by fatigue Patient left: with call bell/phone within reach;in bed;with  family/visitor present Nurse Communication: Mobility status;Need for lift equipment PT Visit Diagnosis: Unsteadiness on feet (R26.81);Muscle weakness (generalized) (M62.81)    Time: 1610-9604 PT Time Calculation (min) (ACUTE ONLY): 41 min   Charges:   PT Evaluation $PT Eval Moderate Complexity: 1 Mod PT Treatments $Therapeutic Activity: 8-22 mins        Texanna Hilburn W,PT Acute Rehabilitation Services Pager:  [phone]  Office:  [phone]    Berline Lopes 08/08/2019, 1:20 PM

## 2019-08-09 DIAGNOSIS — E870 Hyperosmolality and hypernatremia: Secondary | ICD-10-CM

## 2019-08-09 DIAGNOSIS — N179 Acute kidney failure, unspecified: Secondary | ICD-10-CM

## 2019-08-09 LAB — BASIC METABOLIC PANEL
Anion gap: 10 (ref 5–15)
Anion gap: 7 (ref 5–15)
BUN: 17 mg/dL (ref 6–20)
BUN: 15 mg/dL (ref 6–20)
CO2: 26 mmol/L (ref 22–32)
CO2: 27 mmol/L (ref 22–32)
Calcium: 8.6 mg/dL — ABNORMAL LOW (ref 8.9–10.3)
Calcium: 8.7 mg/dL — ABNORMAL LOW (ref 8.9–10.3)
Chloride: 115 mmol/L — ABNORMAL HIGH (ref 98–111)
Chloride: 113 mmol/L — ABNORMAL HIGH (ref 98–111)
Creatinine, Ser: 0.94 mg/dL (ref 0.44–1.00)
Creatinine, Ser: 1.05 mg/dL — ABNORMAL HIGH (ref 0.44–1.00)
GFR calc Af Amer: >60 mL/min (ref >60)
GFR calc Af Amer: >60 mL/min (ref >60)
GFR calc non Af Amer: >60 mL/min (ref >60)
GFR calc non Af Amer: >60 mL/min (ref >60)
Glucose, Bld: 97 mg/dL (ref 70–99)
Glucose, Bld: 133 mg/dL — ABNORMAL HIGH (ref 70–99)
Potassium: 3.4 mmol/L — ABNORMAL LOW (ref 3.5–5.1)
Potassium: 3.8 mmol/L (ref 3.5–5.1)
Sodium: 151 mmol/L — ABNORMAL HIGH (ref 135–145)
Sodium: 147 mmol/L — ABNORMAL HIGH (ref 135–145)

## 2019-08-09 LAB — HEPATIC FUNCTION PANEL
ALT: 36 U/L (ref 0–44)
AST: 54 U/L — ABNORMAL HIGH (ref 15–41)
Albumin: 2.2 g/dL — ABNORMAL LOW (ref 3.5–5.0)
Alkaline Phosphatase: 37 U/L — ABNORMAL LOW (ref 38–126)
Bilirubin, Direct: 0.1 mg/dL (ref 0.0–0.2)
Indirect Bilirubin: 0.3 mg/dL (ref 0.3–0.9)
Total Bilirubin: 0.4 mg/dL (ref 0.3–1.2)
Total Protein: 5.4 g/dL — ABNORMAL LOW (ref 6.5–8.1)

## 2019-08-09 MED ORDER — POTASSIUM CHLORIDE IN NACL 20-0.45 MEQ/L-% IV SOLN
Freq: Once | INTRAVENOUS | Status: AC
  Filled 2019-08-09: qty 1000

## 2019-08-09 NOTE — Progress Notes (Signed)
Family Medicine Teaching Service Daily Progress Note Intern Pager: [phone]  Patient name: Michele [name] Medical record number: [id number] Date of birth: [birth year] Age: 34 y.o. Gender: female  Primary Care Provider: Lattie Haw, MD Consultants: None Code Status: Full  Pt Overview and Major Events to Date:  Michele [name] is a 34 y.o. female presenting with fever with and new oxygen requirement. PMH is significant for cerebral palsy, obesity, seizure disorder, lymphedema, nonverbal intellectual disability  Assessment and Plan: Michele [name] with fever with and new oxygen requirement, found to be positive for COVID-19 infection. PMH is significant forcerebral palsy, obesity, seizure disorder, lymphedema, nonverbal intellectual disability  Acute hypoxic respiratory failure 2/2 COVID-19 Pneumonia: Improving. Stable on 2L.  Inflammatory markers downtrending.  Awaiting home oxygen to DC home, scheduled to arrive this afternoon.  -Decadron 6 mg daily, day 10/10 -Remdesivir completed, 2/12-2/16 -Prone as often as possible -Tylenol scheduled -Pulse ox, maintain O2 over 94%  Mild Hypernatremia, suspected hypovolemic: Improving Na 151>147 on 2/20 after 1L 1/2 NS bolus today.  Suspect in the setting of decreased free water consumption.  -Encourage p.o. intake -Follow-up CMP when available to be in clinic  Mild transaminitis: Stable. AST 67, ALT 51.  Suspect in the setting of recent remdesivir infusion, already completed course.  We will do a follow-up CMP outpatient.  Macrocytosis without anemia: Stable.  Normal prior to admission, MCV 112.  Likely related to reticulocytosis in the setting of acute illness, however could also be related to hypothyroidism.  Folate/B12 WNL. -Follow-up CBC outpatient periodically  Subclinical hypothyroidism: Stable. TSH 5.1, T4 WNL.  Patient with history of hyperthyroidism not currently on Synthroid, could be mild abnormality in  the setting of this vs euthyroid sick syndrome due to current infection. -Follow-up TSH in 4-6 weeks outpatient  Oral pharyngeal thrush: Improving.  In the setting of Decadron, last dose today. -Continue nystatin solution through today, 2/21   Lymphedema: Chronic, stable. Takes Lasix 20 mg as needed at home. -Holding home Lasix in the setting of hyponatremia as above, discussed with PCP on follow-up  Cerebral Palsy: Chronic, stable. - Cont to monitor for signs of discomfort and spacticity.  Seizure Disorder: Chronic, stable. Takes Depakote 1000mg  BID. Keppra 1200mg  BID. Topamax 150mg  BID, Carnitor 500 mg TID, Banzel susp 5mg  BID. Diastat prn rectal.  - continue home regimen  GERD: Chronic, stable.  - Cont Protonix 40mg  daily  Obesity - Reg diet while in hospital - Recommend monitoring weight outpatient but challenging to get weight loss given patient's CP.  FEN/GI: Protonix PPx: Lovenox  Disposition: Hopeful discharge home this afternoon pending receiving oxygen at home   Subjective:  No acute events overnight.  Doing much better this morning, mom feels like she is getting back to her regular self.  Still not eating at her normal, however encouraging her to take plenty of fluids.  Objective: Temp:  [96.7 F (35.9 C)-97.7 F (36.5 C)] 97.4 F (36.3 C) (02/20 2134) Pulse Rate:  [57-77] 57 (02/20 2134) Resp:  [14-24] 16 (02/20 2134) BP: (88-112)/(62-70) 112/64 (02/20 2134) SpO2:  [92 %-100 %] 92 % (02/20 2134) Physical Exam: General: Resting comfortably, NAD HEENT: NCAT, MMM Cardiac: RRR no m/g/r Lungs: Clear anteriorly with diminished bibasilarly, satting appropriately on 1.5L nasal cannula Abdomen: soft Msk: Moves all extremities spontaneously  Ext: Warm, dry, 1+ pitting pedal edema at baseline  Laboratory: Recent Labs  Lab 08/06/19 0531 08/06/19 0531 08/07/19 0500 08/07/19 1624 08/08/19 0715  WBC 7.9  --  7.7  --  7.6  HGB 13.1  --  13.8  --  12.3  HCT  42.7   < > 45.6 38.7 41.6  PLT 177  --  193  --  165   < > = values in this interval not displayed.   Recent Labs  Lab 08/07/19 0500 08/07/19 0500 08/08/19 0715 08/09/19 0650 08/09/19 1643  NA 148*   < > 149* 151* 147*  K 3.4*   < > 3.8 3.4* 3.8  CL 108   < > 115* 115* 113*  CO2 29   < > 24 26 27   BUN 23*   < > 21* 17 15  CREATININE 0.98   < > 0.98 0.94 1.05*  CALCIUM 9.1   < > 8.3* 8.6* 8.7*  PROT 6.6  --  5.0* 5.4*  --   BILITOT 0.6  --  0.6 0.4  --   ALKPHOS 43  --  32* 37*  --   ALT 29  --  34 36  --   AST 52*  --  73* 54*  --   GLUCOSE 84   < > 82 97 133*   < > = values in this interval not displayed.   2/12 -   D-Dimer: 0.51>0.39>0.33>0.35>0.42>0.38  Ferritin: 61>69>88>107>99  CRP: 4.0>7.3>13.9>9.2>5.7>3.1>1.1  Infectious Work up: BCx: NG5D UCx: multiple species COVID-19: Positive    Imaging/Diagnostic Tests: 2/12 CXR  IMPRESSION: Patchy bilateral pneumonia.  4/12, DO 08/09/2019, 9:56 PM PGY-2, [place] [facility] FPTS Intern pager: [phone], text pages welcome

## 2019-08-09 NOTE — Plan of Care (Signed)
  Problem: Clinical Measurements: Goal: Ability to maintain clinical measurements within normal limits will improve Outcome: Progressing Goal: Will remain free from infection Outcome: Progressing   

## 2019-08-09 NOTE — Progress Notes (Signed)
Family Medicine Teaching Service Daily Progress Note Intern Pager: [phone]  Patient name: Michele [name] Medical record number: [id number] Date of birth: Oct 22, 1985 Age: 34 y.o. Gender: female  Primary Care Provider: Towanda Octave, MD Consultants: None Code Status: Full  Pt Overview and Major Events to Date:  Michele [name] is a 34 y.o. female presenting with fever with and new oxygen requirement. PMH is significant for cerebral palsy, obesity, seizure disorder, lymphedema, nonverbal intellectual disability  Assessment and Plan: Sheyann Sulton Fordis a 34 y.o.femalepresenting with fever with and new oxygen requirement. PMH is significant forcerebral palsy, obesity, seizure disorder, lymphedema, nonverbal intellectual disability  COVID-19 Pneumonia:    O2 status stable mid to upper 90s on 1-2L [place]. Overall patient much improved, eating, much brighter and sitting up/active. Patient afebrile. All labs show overall downtrend and stability of WBC, CRP, D-dimer, ferritin. CMP with mildly elevated AST: AST 40>73>52 over last three days, can be followed outpatient.  Patient resting comfortably in bed, non-verbal at baseline. Tocilizumab dose x1 given 2/14. Completed remdesivir treatment 2/16, on day 9/10 dexamethasone. Plan to discharge home today with home oxygen and HH PT/OT. - attempt to prone - Cont continuous pulse ox to keep O2 sats above 94% on room air - Cont vitals per floor - BCx -no growth 5 days - Cont Tylenol scheduled - Remdesivir infusion complete (2/12-2/16)  - Cont Decadron (2/12-) for total of 10 days; day 9 of 10  Hypernatremia, Hyperkalemia Sodium level 151 today. Sodium level 148 2/17, 2/18 and 149 2/19 after 500 mL bolus NS x2 on 2/17, 2/18. 3rd bolus of 500 mL given 2/19. Plan for 1L 1/2NS today, recheck BMP and if >150 can discharge home and follow up outpatient. K at 3.4 today, will add to IVF since patient will not be able to take K-Dur. Have had difficulty tracking intake  and output despite strict I/O orders and repeated requests for charting. Suspect UOP appropriate based on mom's recollection, despite no values charted. Patient has lymphedema and home dose of lasix 20 mg, lasix currently held. Wt at admission was 222lbs, 206 lbs 2/18. Creatinine at baseline at 0.94. All bladder scans with urine <193mL. -1L 1/2NS + KCl bolus today  - Hold lasix  - Daily BMP  MCV elevation without anemia  Euthryoid Sick Syndrome Patient had normal MCV on 07/09/19 at 97, since admission has been elevated to 105-112 since admission on 08/01/19. No anemia, hgb 12-13 WNL. Vit B12 WNL, TSH elevated to 5.118, FT4 WNL. Patient has history of hypothyroidism and previously was treated with synthroid, but no longer on it. Most likely diagnosis is euthyroid sick syndrome as patient had normal MCV and prior to COVID-19. - F/U CBC, thyroid function panel as outpatient  Thrush.  First noted on 2/13 by patient's mother and her nurse. Likely due to Decadron. Unable to be viewed due to lack of patient cooperation, mother helped show Dr. Manson Passey patient's mouth on 2/16, which appeared clear.  Decadron to continue for 1 more days, plan to continue nystatin for duration of decadron. -Continue nystatin solution for duration of decadron (1 more days)  Lymphedema Chronic. Stable. On home lasix at 20mg  daily PRN. PO intake either very poor again today, or not charted. Currently patient on strict I/Os, will continue and request daily weights, qshift bladder scans. - Cont strict I/Os, daily weights, qshift bladder scans - Hold home Lasix, may take as needed at home with follow up with PCP - See hypernatremia treatment above  Cerebral Palsy. Chronic, stable. - Cont to monitor for signs of discomfort and spacticity.  Seizure Disorder. Chronic, stable. Takes Depakote 1000mg  BID. Keppra 1200mg  BID. Topamax 150mg  BID, Carnitor 500 mg TID, Banzel susp 5mg  BID. Diastat prn rectal.  - continue home  regimen  GERD Chronic, stable.  - Cont Protonix 40mg  daily  Obesity - Reg diet while in hospital - Recommend monitoring weight outpatient but challenging to get weight loss given patient's CP.  FEN/GI: Protonix PPx: Lovenox  Disposition: stable  Subjective:  Patient bright and active today, eating chips, interacting, clearly feeling better.  Objective: Temp:  [97.7 F (36.5 C)-98 F (36.7 C)] 97.7 F (36.5 C) (02/20 0045) Pulse Rate:  [51-87] 62 (02/20 0045) Resp:  [15-27] 20 (02/20 0052) BP: (88-112)/(62-70) 88/70 (02/20 0045) SpO2:  [91 %-100 %] 100 % (02/20 0045) Physical Exam: General: Lying in bed, not appearing in distress Heart: Regular rate and rhythm with no murmurs appreciated Lungs: Satting well on 1.5 L nasal cannula, no respiratory distress, diminished air flow at bilateral bases Abdomen: No abdominal pain on palpation Skin: Warm and dry Extremities: +1 pitting pedal edema  Laboratory: Recent Labs  Lab 08/06/19 0531 08/06/19 0531 08/07/19 0500 08/07/19 1624 08/08/19 0715  WBC 7.9  --  7.7  --  7.6  HGB 13.1  --  13.8  --  12.3  HCT 42.7   < > 45.6 38.7 41.6  PLT 177  --  193  --  165   < > = values in this interval not displayed.   Recent Labs  Lab 08/06/19 0531 08/07/19 0500 08/08/19 0715  NA 148* 148* 149*  K 3.5 3.4* 3.8  CL 108 108 115*  CO2 30 29 24   BUN 22* 23* 21*  CREATININE 0.92 0.98 0.98  CALCIUM 9.1 9.1 8.3*  PROT 6.2* 6.6 5.0*  BILITOT 0.7 0.6 0.6  ALKPHOS 46 43 32*  ALT 23 29 34  AST 40 52* 73*  GLUCOSE 82 84 82   2/12 -   D-Dimer: 0.51>0.39>0.33>0.35>0.42>0.38  Ferritin: 61>69>88>107>99  CRP: 4.0>7.3>13.9>9.2>5.7>3.1>1.1  Infectious Work up: BCx: NG5D UCx: multiple species COVID-19: Positive    Imaging/Diagnostic Tests: 2/12 CXR  IMPRESSION: Patchy bilateral pneumonia.  Gladys Damme, MD 08/09/2019, 7:40 AM PGY-1, [place] Intern pager: [phone], text pages welcome

## 2019-08-10 ENCOUNTER — Other Ambulatory Visit: Payer: Self-pay | Admitting: Family Medicine

## 2019-08-10 LAB — HEPATIC FUNCTION PANEL
ALT: 51 U/L — ABNORMAL HIGH (ref 0–44)
AST: 67 U/L — ABNORMAL HIGH (ref 15–41)
Albumin: 2.3 g/dL — ABNORMAL LOW (ref 3.5–5.0)
Alkaline Phosphatase: 36 U/L — ABNORMAL LOW (ref 38–126)
Bilirubin, Direct: <0.1 mg/dL (ref 0.0–0.2)
Total Bilirubin: 0.3 mg/dL (ref 0.3–1.2)
Total Protein: 5.2 g/dL — ABNORMAL LOW (ref 6.5–8.1)

## 2019-08-10 NOTE — TOC Initial Note (Addendum)
Transition of Care [facility]) - Initial/Assessment Note    Patient Details  Name: Michele [name] MRN: [id number] Date of Birth: 1985/07/14  [facility] CM/SW Contact:    Lawerance Sabal, RN Phone Number: 08/10/2019, 8:48 AM  Clinical Narrative:               8:48am- Spoke to patient's mom. Patient will need home oxygen and HH set up. She would like [facility], referral accepted. She would like oxygen delivered to the house after 1pm. Her pastor who lives across the street will be at the house (address verified from face sheet) to accept delivery, his cell is [phone].  Patient will need [facility] for transport per mom.  Will arrange [facility] after O2 delivery has been confirmed. Referral for O2 placed to [facility]   14:45 Reached out to patient's mom, she states that O2 has been delivered to the house. Notified nurse to let me know when they are ready for me call PTAR  !5:55 PTAR called per nurse request.         Barriers to Discharge: No Barriers Identified   Patient Goals and CMS Choice Patient states their goals for this hospitalization and ongoing recovery are:: to go home CMS Medicare.gov Compare Post Acute Care list provided to:: Other (Comment Required) Choice offered to / list presented to : Parent  Expected Discharge Plan and Services                           DME Arranged: Oxygen DME Agency: Patsy Lager Date DME Agency Contacted: 08/10/19 Time DME Agency Contacted: [phone] Representative spoke with at DME Agency: Verlon Au HH Arranged: PT, OT [facility] Agency: [facility] Date [facility] Agency Contacted: 08/10/19 Time HH Agency Contacted: [phone] Representative spoke with at [facility] Agency: Kandee Keen  Prior Living Arrangements/Services                       Activities of Daily Living      Permission Sought/Granted                  Emotional Assessment              Admission diagnosis:  Upper respiratory infection [J06.9] Hypoxia [R09.02] SIRS  (systemic inflammatory response syndrome) (HCC) [R65.10] COVID-19 [U07.1] Patient Active Problem List   Diagnosis Date Noted  . COVID-19   . AKI (acute kidney injury) (HCC) 08/03/2019  . Acute hypoxemic respiratory failure due to COVID-19 (HCC) 08/03/2019  . Pneumonia due to COVID-19 virus 08/01/2019  . Anasarca 07/09/2019  . Allergies 06/02/2019  . Upper respiratory symptom 06/02/2019  . Decreased urination 01/10/2019  . Lennox-Gastaut syndrome (HCC) 07/06/2017  . Lymphedema 01/22/2017  . Bilateral lower extremity edema 04/21/2016  . Unsteadiness on feet   . Health care maintenance 07/20/2014  . Hypernatremia 06/02/2014  . Amenorrhea 05/19/2014  . 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 Morbid obesity (HCC) 12/24/2012  . 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 URINARY INCONTINENCE 02/25/2010  . Full incontinence of feces 02/25/2010  . Hypothyroidism 01/29/2009  . 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 Cerebral palsy (HCC) 01/29/2009  . GASTROESOPHAGEAL REFLUX DISEASE 01/29/2009  . Osteoporosis 01/29/2009  . Seizure disorder (HCC) 01/29/2009   PCP:  Towanda Octave, MD Pharmacy:   [facility] [address] [place] - [address] [place] [address] Phone: [phone] Fax: [phone]  [facility]  [id number] [address] [place] [address] Phone: [phone] Fax: [phone]     Social Determinants of Health (SDOH) Interventions    Readmission Risk Interventions No flowsheet data found.

## 2019-08-10 NOTE — Discharge Planning (Addendum)
Nsg Discharge Note  Admit Date:  08/01/2019 Discharge date: 08/10/2019   Michele [name] to be D/C'd Home per MD order.  AVS completed.     Discharge Medication: Allergies as of 08/10/2019      Reactions   Carbamazepine Rash      Medication List    STOP taking these medications   clotrimazole 1 % cream Commonly known as: LOTRIMIN   loperamide 2 MG capsule Commonly known as: IMODIUM   ondansetron 4 MG disintegrating tablet Commonly known as: Zofran ODT     TAKE these medications   acetaminophen 650 MG suppository Commonly known as: TYLENOL Place 650 mg rectally every 8 (eight) hours as needed for mild pain. What changed: Another medication with the same name was removed. Continue taking this medication, and follow the directions you see here.   acetaminophen 500 MG tablet Commonly known as: TYLENOL Take 500 mg by mouth every 6 (six) hours as needed for fever. What changed: Another medication with the same name was removed. Continue taking this medication, and follow the directions you see here.   Banzel 40 MG/ML Susp Generic drug: Rufinamide Take 38ml in the morning and 47ml at night What changed:   how much to take  how to take this  when to take this   Calcarb 600/D 600-400 MG-UNIT tablet Generic drug: Calcium Carbonate-Vitamin D Take 1 tablet by mouth daily. Only gives once a week.   Depakote Sprinkles 125 MG capsule Generic drug: divalproex TAKE 8 CAPSULES BY MOUTH TWICE DAILY What changed:   how much to take  how to take this  when to take this   Diastat AcuDial 20 MG Gel Generic drug: diazepam DIAL TO 15 MG, GIVE RECTALLY FOR SEIZURES LASTING 2 MINUTES OR LONGER   feeding supplement (ENSURE ENLIVE) Liqd Take 237 mLs by mouth 3 (three) times daily between meals.   fluticasone 50 MCG/ACT nasal spray Commonly known as: FLONASE Place 2 sprays into both nostrils 2 (two) times daily. poonam patel MD What changed: when to take this   furosemide 20 MG  tablet Commonly known as: LASIX Take 1 tablet PO PRN for fluid retention What changed:   how much to take  how to take this  when to take this  additional instructions   hydrocortisone cream 0.5 % Apply 1 application topically 2 (two) times daily. What changed:   when to take this  reasons to take this   ibuprofen 200 MG tablet Commonly known as: ADVIL Take 400 mg by mouth as needed for moderate pain.   Keppra 100 MG/ML solution Generic drug: levETIRAcetam Take 72ml by mouth every morning and 23ml by mouth at night   lactulose 10 GM/15ML solution Commonly known as: CHRONULAC TAKE 30 ML BY MOUTH TWICE DAILY What changed:   how much to take  how to take this  when to take this   levOCARNitine 1 GM/10ML solution Commonly known as: CARNITOR TAKE 5 MLS BY MOUTH THREE TIMES DAILY What changed:   how much to take  how to take this  when to take this   liver oil-zinc oxide 40 % ointment Commonly known as: DESITIN Apply 1 application topically daily.   nystatin powder Commonly known as: nystatin APPLY TO AFFECTED DAIPER AREAS WITH EVERY DIAPER CHANGE UNTIL RASH CLEARS   pantoprazole sodium 40 mg/20 mL Pack Commonly known as: PROTONIX MIX 1 PACK AND TAKE DAILY AS DIRECTED What changed: See the new instructions.   Topamax Sprinkle 25 MG  capsule Generic drug: topiramate TAKE 6 CAPSULES BY MOUTH IN THE MORNING AND 6 CAPSULES BY MOUTH IN THE EVENING WITH FOOD What changed:   how much to take  how to take this  when to take this            Durable Medical Equipment  (From admission, onward)         Start     Ordered   08/10/19 0907  DME Oxygen  (Discharge Planning)  Once    Question Answer Comment  Length of Need 6 Months   Mode or (Route) Nasal cannula   Liters per Minute 2   Frequency Continuous (stationary and portable oxygen unit needed)   Oxygen delivery system Gas      08/10/19 0907          Discharge Assessment: Vitals:    08/10/19 1101 08/10/19 1236  BP:  97/66  Pulse: (!) 54 79  Resp: 17 (!) 24  Temp:  98 F (36.7 C)  SpO2: 99% 92%   Skin clean, dry and intact without evidence of skin break down, no evidence of skin tears noted. IV catheter discontinued intact. Site without signs and symptoms of complications - no redness or edema noted at insertion site, patient denies c/o pain - only slight tenderness at site.  Dressing with slight pressure applied.  D/c Instructions-Education: Discharge instructions given to patient/family with verbalized understanding. D/c education completed with patient/family including follow up instructions, medication list, d/c activities limitations if indicated, with other d/c instructions as indicated by MD - patient able to verbalize understanding, all questions fully answered. Patient instructed to return to ED, call 911, or call MD for any changes in condition.  Patient's home medications returned to mother at bedside prior to d/c.  Patient escorted via stretcher, and D/C home via [facility] ambulance services.   Hiram Comber, RN 08/10/2019 4:33 PM

## 2019-08-10 NOTE — Discharge Instructions (Signed)
Thank you so much for allowing [place] to be part of your care.  Michele [name] was admitted to the hospital due to COVID-19 pneumonia requiring oxygen.  While she was here she received remdesivir, steroids, and oxygen--fortunately with this she has improved substantially.  However, she is still needing a little bit of oxygen and we have sent her home with this.  She also was noted to have a little bit of a high sodium during this time, please make sure you are encouraging water and fluids when she is home to keep this at an appropriate level.  If she has any difficulty breathing, chest pain, or you have difficulty waking her up--please present back to the ED.  We have scheduled follow-up with her PCP virtually for this week, she will need to have repeated labs at some point when she is cleared from a quarantine standpoint.  Please make sure you quarantine her for a total of 21 days from date of initial Covid positive test/symptoms (2/12).

## 2019-08-10 NOTE — Progress Notes (Signed)
SATURATION QUALIFICATIONS: (This note is used to comply with regulatory documentation for home oxygen)  Patient Saturations on Room Air at Rest = 84%  Patient Saturations on Room Air while Ambulating = unable to assess; pt non-ambulatory  Patient Saturations on 2 Liters of oxygen while at rest = 96%

## 2019-08-12 ENCOUNTER — Ambulatory Visit: Payer: Medicare Other

## 2019-08-12 ENCOUNTER — Telehealth: Payer: Self-pay

## 2019-08-12 NOTE — Telephone Encounter (Signed)
Received call from patient's mother asking for me to call [employer] to have them look at the portable oxygen tanks they left. She states that there is no adaptor to be able to hook up tubing. She ensured that the oxygen concentrator is working fine and the patient has a supply of oxygen. I LVM w Verlon Au at [facility] to call the mother back at [phone]

## 2019-08-13 ENCOUNTER — Other Ambulatory Visit: Payer: Self-pay

## 2019-08-13 ENCOUNTER — Encounter: Payer: Self-pay | Admitting: Family Medicine

## 2019-08-13 ENCOUNTER — Telehealth (INDEPENDENT_AMBULATORY_CARE_PROVIDER_SITE_OTHER): Payer: Medicare Other | Admitting: Family Medicine

## 2019-08-13 VITALS — BP 114/62 | Temp 97.6°F

## 2019-08-13 DIAGNOSIS — I890 Lymphedema, not elsewhere classified: Secondary | ICD-10-CM | POA: Diagnosis not present

## 2019-08-13 DIAGNOSIS — U071 COVID-19: Secondary | ICD-10-CM

## 2019-08-13 DIAGNOSIS — E870 Hyperosmolality and hypernatremia: Secondary | ICD-10-CM

## 2019-08-13 MED ORDER — LACTULOSE 10 GM/15ML PO SOLN: ORAL | 2 refills | Status: DC

## 2019-08-13 NOTE — Progress Notes (Signed)
[facility] Telemedicine Visit  Patient consented to have virtual visit. Method of visit: Telephone  Encounter participants: Patient: Michele [name] - located at home  Provider: Derrel Nip - located at clinic   Chief Complaint: Hospital follow up   HPI: Patient is nonverbal so patient's mother provides report.  Patient is reportedly doing well since discharge from the hospital.  She has been up and moving around.  She was up and moving around last night and her oxygen saturations dropped into the 80s but when she sat down and was placed on 1.5 L nasal cannula O2 her oxygen saturation went back up to 95.  Her oxygen saturation this morning was in the mid 90s on room air.  Her diet is improved since discharge and her mother reports that she is eating more than she was while she was in the hospital but is not back to her normal eating habits at this time.  Her blood pressures have been normal limits.  Patient does have issues with lymphedema and this is unchanged since discharge.  Patient's mother reports she has had a referral to a specialist for some time but has been unable to do the visit because of her COVID-19.  We will follow-up with the specialist when possible  Patient is also out of lactulose.  Patient's mother is unsure why it this was removed from her med list.  She reports that the patient takes 30 mL of lactulose twice daily.  We will refill the lactulose   ROS: per HPI  Pertinent PMHx: COVID-19 infection  Exam:  Respiratory: Patient is nonverbal and the patient's mother's camera was not working at the time so the visit was completely telephone.  So was unable to determine if patient was having any difficulty breathing.  Assessment/Plan:  Lymphedema Lymphedema is reportedly stable per the patient's mother. -Continue compression stockings -Patient has physiotherapy referral but has been unable to attend due to COVID-19 -Lasix as needed -We will  review when 1 to 2 months  COVID-19 Patient is reportedly doing better from her COVID-19 infection.  Oxygen saturations with ambulation's occasionally drop into the mid 80s but quickly recovered with 1.5 L nasal cannula O2 and rest.  Patient's O2 saturation at rest on room air is approximately 95%. -Continue to monitor for respiratory decompensation -COVID-19 at home monitoring protocol -Strict return precautions given    Time spent during visit with patient: 17 minutes

## 2019-08-13 NOTE — Assessment & Plan Note (Signed)
Lymphedema is reportedly stable per the patient's mother. -Continue compression stockings -Patient has physiotherapy referral but has been unable to attend due to COVID-19 -Lasix as needed -We will review when 1 to 2 months

## 2019-08-13 NOTE — Assessment & Plan Note (Signed)
Patient is reportedly doing better from her COVID-19 infection.  Oxygen saturations with ambulation's occasionally drop into the mid 80s but quickly recovered with 1.5 L nasal cannula O2 and rest.  Patient's O2 saturation at rest on room air is approximately 95%. -Continue to monitor for respiratory decompensation -COVID-19 at home monitoring protocol -Strict return precautions given

## 2019-08-15 ENCOUNTER — Other Ambulatory Visit: Payer: Medicare Other

## 2019-08-15 ENCOUNTER — Other Ambulatory Visit: Payer: Self-pay

## 2019-08-15 DIAGNOSIS — E870 Hyperosmolality and hypernatremia: Secondary | ICD-10-CM

## 2019-08-16 LAB — COMPREHENSIVE METABOLIC PANEL
ALT: 48 IU/L — ABNORMAL HIGH (ref 0–32)
AST: 34 IU/L (ref 0–40)
Albumin/Globulin Ratio: 1.2 (ref 1.2–2.2)
Albumin: 3.3 g/dL — ABNORMAL LOW (ref 3.8–4.8)
Alkaline Phosphatase: 56 IU/L (ref 39–117)
BUN/Creatinine Ratio: 15 (ref 9–23)
BUN: 14 mg/dL (ref 6–20)
Bilirubin Total: <0.2 mg/dL (ref 0.0–1.2)
CO2: 24 mmol/L (ref 20–29)
Calcium: 9.3 mg/dL (ref 8.7–10.2)
Chloride: 107 mmol/L — ABNORMAL HIGH (ref 96–106)
Creatinine, Ser: 0.94 mg/dL (ref 0.57–1.00)
GFR calc Af Amer: 92 mL/min/{1.73_m2} (ref >59)
GFR calc non Af Amer: 80 mL/min/{1.73_m2} (ref >59)
Globulin, Total: 2.8 g/dL (ref 1.5–4.5)
Glucose: 84 mg/dL (ref 65–99)
Potassium: 4.3 mmol/L (ref 3.5–5.2)
Sodium: 144 mmol/L (ref 134–144)
Total Protein: 6.1 g/dL (ref 6.0–8.5)

## 2019-08-20 NOTE — Chronic Care Management (AMB) (Signed)
Care Management   Initial Visit Note  08/20/2019 Name: Michele [name] MRN: [id number] DOB: [birth year]   Assessment: Michele [name] is a 34 y.o. year old female who sees Lattie Haw, MD for primary care. The care management team was consulted for assistance with care management and care coordination needs related to Disease Management Educational Needs.   Review of patient status, including review of consultants reports, relevant laboratory and other test results, and collaboration with appropriate care team members and the patient's provider was performed as part of comprehensive patient evaluation and provision of care management services.    SDOH (Social Determinants of Health) assessments performed: No See Care Plan activities for detailed interventions related to [facility])     Outpatient Encounter Medications as of 08/12/2019  Medication Sig Note  . acetaminophen (TYLENOL) 500 MG tablet Take 500 mg by mouth every 6 (six) hours as needed for fever.   Marland Kitchen BANZEL 40 MG/ML SUSP Take 20ml in the morning and 49ml at night (Patient taking differently: Take 200 mg by mouth in the morning and at bedtime. Take 20ml in the morning and 38ml at night)   . Calcium Carbonate-Vitamin D (CALCARB 600/D) 600-400 MG-UNIT tablet Take 1 tablet by mouth daily. Only gives once a week.   Marland Kitchen DEPAKOTE SPRINKLES 125 MG capsule TAKE 8 CAPSULES BY MOUTH TWICE DAILY (Patient taking differently: Take 1,000 mg by mouth 2 (two) times daily. TAKE 8 CAPSULES BY MOUTH TWICE DAILY)   . DIASTAT ACUDIAL 20 MG GEL DIAL TO 15 MG, GIVE RECTALLY FOR SEIZURES LASTING 2 MINUTES OR LONGER   . feeding supplement, ENSURE ENLIVE, (ENSURE ENLIVE) LIQD Take 237 mLs by mouth 3 (three) times daily between meals.   . fluticasone (FLONASE) 50 MCG/ACT nasal spray Place 2 sprays into both nostrils 2 (two) times daily. poonam patel MD (Patient taking differently: Place 2 sprays into both nostrils daily. poonam patel MD)   . furosemide (LASIX) 20 MG tablet Take  1 tablet PO PRN for fluid retention (Patient taking differently: Take 20 mg by mouth daily. )   . hydrocortisone cream 0.5 % Apply 1 application topically 2 (two) times daily. (Patient taking differently: Apply 1 application topically 2 (two) times daily as needed (irritation). ) 06/25/2019: PRN  . ibuprofen (ADVIL,MOTRIN) 200 MG tablet Take 400 mg by mouth as needed for moderate pain. 12/17/2017: PRN  . KEPPRA 100 MG/ML solution Take 61ml by mouth every morning and 71ml by mouth at night   . levOCARNitine (CARNITOR) 1 GM/10ML solution TAKE 5 MLS BY MOUTH THREE TIMES DAILY (Patient taking differently: Take 500 mg by mouth 3 (three) times daily. TAKE 5 MLS BY MOUTH THREE TIMES DAILY)   . liver oil-zinc oxide (DESITIN) 40 % ointment Apply 1 application topically daily.   Marland Kitchen nystatin (NYSTATIN) powder APPLY TO AFFECTED DAIPER AREAS WITH EVERY DIAPER CHANGE UNTIL RASH CLEARS   . pantoprazole sodium (PROTONIX) 40 mg/20 mL PACK MIX 1 PACK AND TAKE DAILY AS DIRECTED (Patient taking differently: Take 40 mg by mouth daily. MIX 1 PACK AND TAKE DAILY AS DIRECTED)   . TOPAMAX SPRINKLE 25 MG capsule TAKE 6 CAPSULES BY MOUTH IN THE MORNING AND 6 CAPSULES BY MOUTH IN THE EVENING WITH FOOD (Patient taking differently: Take 150 mg by mouth 2 (two) times daily. TAKE 6 CAPSULES BY MOUTH IN THE MORNING AND 6 CAPSULES BY MOUTH IN THE EVENING WITH FOOD)   . acetaminophen (TYLENOL) 650 MG suppository Place 650 mg rectally every 8 (eight) hours  as needed for mild pain. 12/17/2017: PRN  . [DISCONTINUED] lactulose (CHRONULAC) 10 GM/15ML solution TAKE 30 ML BY MOUTH TWICE DAILY (Patient not taking: Reported on 08/12/2019) 08/12/2019: Waiting for refill   No facility-administered encounter medications on file as of 08/12/2019.   Goals Addressed            This Visit's Progress   . "  I am not sure when Home Health is coming for Physical therapy" (pt-stated)       CARE PLAN ENTRY (see longtitudinal plan of care for additional care  plan information)  Current Barriers:  Marland Kitchen Knowledge Deficits related to post discharge instructions following IP admission on 08/01/19 event . Chronic Disease Management support and education needs related to to post discharge instructions,  including MD appointments and [facility] services.  Nurse Case Manager Clinical Goal(s):  Marland Kitchen Over the next 14 days, patient will verbalize understanding of plan for post discharge instructions  Interventions:  . Evaluation of current treatment plan and patient/mother's adherence to plan as established by provider. . Assessed patients breathing mother stated she is breathing ok, she is on 2 liters.and doing well so far.  Algis Downs patient/mother that I would follow up with [facility] and call her with information about start of care . Reviewed medications with patient /mother . Collaborated with Meghan at [facility] regarding Home health service and the start of care 2/24th relayed to mother . Discussed plans with patient for ongoing care management follow up and provided patient with direct contact information for care management team . Reviewed scheduled/upcoming provider appointments including: 2/23 Lymphedema evaluation, 2/24 victor Cresenzo  Patient Self Care Activities:  . Patient/mother verbalizes understanding of plan  . Calls pharmacy for medication refills . Calls provider office for new concerns or questions  Initial goal documentation          Follow up plan:  The care management team will reach out to the patient again over the next 14 days.  The patient has been provided with contact information for the care management team and has been advised to call with any health related questions or concerns.   Ms. Berquist was given information about Care Management services today including:  1. Care Management services include personalized support from designated clinical staff supervised by a physician, including individualized plan of care and coordination  with other care providers 2. 24/7 contact phone numbers for assistance for urgent and routine care needs. 3. The patient may stop Care Management services at any time (effective at the end of the month) by phone call to the office staff.  Patient agreed to services and verbal consent obtained.  Juanell Fairly RN, BSN, [facility] Care Management Coordinator [facility] Phone: [phone] Fax: [phone]

## 2019-08-20 NOTE — Patient Instructions (Signed)
Visit Information  Goals Addressed            This Visit's Progress   . "  I am not sure when Home Health is coming for Physical therapy" (pt-stated)       CARE PLAN ENTRY (see longtitudinal plan of care for additional care plan information)  Current Barriers:  Marland Kitchen Knowledge Deficits related to post discharge instructions following IP admission on 08/01/19 event . Chronic Disease Management support and education needs related to to post discharge instructions,  including MD appointments and [facility] services.  Nurse Case Manager Clinical Goal(s):  Marland Kitchen Over the next 14 days, patient will verbalize understanding of plan for post discharge instructions  Interventions:  . Evaluation of current treatment plan and patient/mother's adherence to plan as established by provider. Algis Downs patient/mother that I would follow up with [facility] and call her with information about start of care . Reviewed medications with patient /mother . Collaborated with Meghan at [facility] regarding Home health service and the start of care 2/24th relayed to mother . Discussed plans with patient for ongoing care management follow up and provided patient with direct contact information for care management team . Reviewed scheduled/upcoming provider appointments including: 2/23 Lymphedema evaluation, 2/24 victor Cresenzo  Patient Self Care Activities:  . Patient/mother verbalizes understanding of plan  . Calls pharmacy for medication refills . Calls provider office for new concerns or questions  Initial goal documentation         Ms. Levandoski was given information about Care Management services today including:  1. Care Management services include personalized support from designated clinical staff supervised by her physician, including individualized plan of care and coordination with other care providers 2. 24/7 contact phone numbers for assistance for urgent and routine care needs. 3. The patient may stop CCM services at  any time (effective at the end of the month) by phone call to the office staff.  Patient agreed to services and verbal consent obtained.   The patient verbalized understanding of instructions provided today and declined a print copy of patient instruction materials.   The care management team will reach out to the patient again over the next 14 days.  The patient has been provided with contact information for the care management team and has been advised to call with any health related questions or concerns.   Juanell Fairly RN, BSN, [facility] Care Management Coordinator [facility] Phone: [phone] Fax: [phone]

## 2019-08-21 ENCOUNTER — Telehealth: Payer: Self-pay | Admitting: Family Medicine

## 2019-08-21 NOTE — Telephone Encounter (Signed)
Called and spoke with patient's caregiver regarding patient's lab results.  Informed her that her sodium was down to normal range and that her liver enzymes were returning to normal range.  She also had questions regarding when she can get the Covid vaccine.  Was able to look up current CDC recommendations stating that because she received monoclonal antibodies during her admission she would need to wait 90 days to get vaccinated.

## 2019-08-26 ENCOUNTER — Ambulatory Visit: Payer: Medicare Other

## 2019-08-26 ENCOUNTER — Other Ambulatory Visit: Payer: Self-pay

## 2019-08-26 NOTE — Patient Instructions (Addendum)
Visit Information  Goals Addressed            This Visit's Progress   . "  I am not sure when Home Health is coming for Physical therapy" (pt-stated)       CARE PLAN ENTRY (see longtitudinal plan of care for additional care plan information)  Current Barriers:  Marland Kitchen Knowledge Deficits related to post discharge instructions following IP admission on 08/01/19 event . Chronic Disease Management support and education needs related to to post discharge instructions,  including MD appointments and [facility] services.  Nurse Case Manager Clinical Goal(s):  Marland Kitchen Over the next 14 days, patient will verbalize understanding of plan for post discharge instructions  Interventions:  . Evaluation of current treatment plan and patient/mother's adherence to plan as established by provider. . Assessed patients breathing mother stated she is breathing ok, she is on 2 liters.and doing well so far.  Algis Downs patient/mother that I would follow up with [facility] and call her with information about start of care . Reviewed medications with patient /mother . Collaborated with Meghan at [facility] regarding Home health service and the start of care 2/24th relayed to mother . Discussed plans with patient for ongoing care management follow up and provided patient with direct contact information for care management team . Reviewed scheduled/upcoming provider appointments including: 2/23 Lymphedema evaluation, 2/24 victor Cresenzo  08/26/19 o Followed up with [name] and she states that Venisa is doing fine. o PT and OT are coming to the home.  o She has no other needs from me currently o She has my phone number is she has any questions or concerns   Patient Self Care Activities:  . Patient/mother verbalizes understanding of plan  . Calls pharmacy for medication refills . Calls provider office for new concerns or questions  Please see past updates related to this goal by clicking on the "Past Updates" button in the selected  goal         [name] thank you for speaking with me and allowing me to serve you.  The patient verbalized understanding of instructions provided today and declined a print copy of patient instruction materials.   No further follow up required:  Juanell Fairly RN, BSN, [facility] Care Management Coordinator [facility] Phone: [phone] Fax: [phone]

## 2019-08-26 NOTE — Chronic Care Management (AMB) (Signed)
  Care Management   Follow Up Note   08/26/2019 Name: Michele [name] MRN: [id number] DOB: Oct 30, 1985  Referred by: Michele Octave, MD Reason for referral : Care Coordination (Care Management RNCM Home Health)   Michele [name] is a 34 y.o. year old female who is a primary care patient of Michele Octave, MD. The care management team was consulted for assistance with care management and care coordination needs.    Review of patient status, including review of consultants reports, relevant laboratory and other test results, and collaboration with appropriate care team members and the patient's provider was performed as part of comprehensive patient evaluation and provision of chronic care management services.    SDOH (Social Determinants of Health) assessments performed: No See Care Plan activities for detailed interventions related to Michele [name])     Advanced Directives: See Care Plan and Vynca application for related entries.   Goals Addressed            This Visit's Progress   . "  I am not sure when Home Health is coming for Physical therapy" (pt-stated)       CARE PLAN ENTRY (see longtitudinal plan of care for additional care plan information)  Current Barriers:  Michele [name] Kitchen Knowledge Deficits related to post discharge instructions following IP admission on 08/01/19 event . Chronic Disease Management support and education needs related to to post discharge instructions,  including MD appointments and [name] services.  Nurse Case Manager Clinical Goal(s):  Michele [name] Kitchen Over the next 14 days, patient will verbalize understanding of plan for post discharge instructions  Interventions:  . Evaluation of current treatment plan and patient/mother's adherence to plan as established by provider. . Assessed patients breathing mother stated she is breathing ok, she is on 2 liters.and doing well so far.  Michele [name] patient/mother that I would follow up with Michele [name] and call her with information about start of care . Reviewed  medications with patient /mother . Collaborated with Michele [name] at Michele [name] regarding Home health service and the start of care 2/24th relayed to mother . Discussed plans with patient for ongoing care management follow up and provided patient with direct contact information for care management team . Reviewed scheduled/upcoming provider appointments including: 2/23 Lymphedema evaluation, 2/24 Michele [name]  08/26/19 o Followed up with Michele [name] and she states that Michele [name] is doing fine. o PT and OT are coming to the home.  o She has no other needs from me currently o She has my phone number is she has any questions or concerns   Patient Self Care Activities:  . Patient/mother verbalizes understanding of plan  . Calls pharmacy for medication refills . Calls provider office for new concerns or questions  Please see past updates related to this goal by clicking on the "Past Updates" button in the selected goal          Michele [name] thank you for speaking with me and allowing me to serve you.    No further follow up required:   Michele Fairly RN, BSN, [facility] Care Management Coordinator [facility] Phone: [phone] Fax: [phone]

## 2019-10-10 ENCOUNTER — Other Ambulatory Visit: Payer: Self-pay | Admitting: Student in an Organized Health Care Education/Training Program

## 2019-10-10 ENCOUNTER — Telehealth (INDEPENDENT_AMBULATORY_CARE_PROVIDER_SITE_OTHER): Payer: Self-pay | Admitting: Family

## 2019-10-10 NOTE — Telephone Encounter (Signed)
  Who's calling (name and relationship to patient) : Vivien Presto - Mom   Best contact number: [phone]  Provider they see: Elveria Rising   Reason for call: Mom stopped by to have [name] write a letter for [employer]. Stating Meryle's state or diagnosis and her mother being her legal guardian, etc. Scanned release in the chart for permission to fax the letter to Ara Kussmaul at the social security office, the person handling the case. Her fax #[phone] phone #[phone] Mom would also like to have an extra copy of this letter printed so she can pick this up and have a copy for herself on hand. Please advise mother when this is complete.    PRESCRIPTION REFILL ONLY  Name of prescription:  Pharmacy:

## 2019-10-13 NOTE — Telephone Encounter (Signed)
Mother came to office this AM. She is requesting a letter be written stating patient's condition is still the same. Please call mother once this letter is complete. She can be reached at [phone]. Rufina Falco

## 2019-10-13 NOTE — Telephone Encounter (Signed)
I spoke to mother who is aware the letter is ready for pick up. Rufina Falco

## 2019-10-13 NOTE — Telephone Encounter (Signed)
Please let Mom know that the letter has been written and is at the front desk for her to pickup. Thanks, [name]

## 2019-10-15 ENCOUNTER — Telehealth: Payer: Self-pay | Admitting: Family Medicine

## 2019-10-15 NOTE — Telephone Encounter (Signed)
Mother needs a letter updated for Michele [name], it is for social security. I have put a copy of the previous letter in the doctors box. She also like to speak to the nurse about one of [name]'s medications. Please call her . jw

## 2019-10-16 ENCOUNTER — Other Ambulatory Visit: Payer: Self-pay

## 2019-10-16 DIAGNOSIS — K219 Gastro-esophageal reflux disease without esophagitis: Secondary | ICD-10-CM

## 2019-10-16 NOTE — Telephone Encounter (Signed)
PA needed for acid reflux medication, I received the fax this am. Will work on it.

## 2019-10-17 MED ORDER — PANTOPRAZOLE SODIUM 40 MG PO PACK: 40.0000 mg | PACK | Freq: Every day | ORAL | 0 refills | Status: DC

## 2019-10-20 NOTE — Telephone Encounter (Signed)
Mother is calling back to check the status of the letter she needs for Michele [name]. Please call mother and update her. She needs this for social security jw

## 2019-10-21 ENCOUNTER — Other Ambulatory Visit: Payer: Self-pay | Admitting: Family Medicine

## 2019-10-21 DIAGNOSIS — I890 Lymphedema, not elsewhere classified: Secondary | ICD-10-CM

## 2019-10-21 NOTE — Telephone Encounter (Signed)
Called [name]'s mom who says she does not need the form completed as it has been provided by her pediatric neurologist.

## 2019-10-29 ENCOUNTER — Other Ambulatory Visit: Payer: Self-pay | Admitting: Family Medicine

## 2019-10-29 DIAGNOSIS — R600 Localized edema: Secondary | ICD-10-CM

## 2019-11-14 ENCOUNTER — Other Ambulatory Visit: Payer: Self-pay | Admitting: Family Medicine

## 2019-11-14 DIAGNOSIS — K219 Gastro-esophageal reflux disease without esophagitis: Secondary | ICD-10-CM

## 2019-12-04 ENCOUNTER — Other Ambulatory Visit: Payer: Self-pay

## 2019-12-04 ENCOUNTER — Ambulatory Visit: Payer: Medicare Other | Attending: Family Medicine

## 2019-12-04 DIAGNOSIS — I890 Lymphedema, not elsewhere classified: Secondary | ICD-10-CM

## 2019-12-04 NOTE — Therapy (Addendum)
[place], [place], [address] Phone: [phone]   Fax:  [phone]  Physical Therapy Evaluation  Patient Details  Name: Michele [name] MRN: [id number] Date of Birth: 08/31/1985 No data recorded  Encounter Date: 12/04/2019   PT End of Session - 12/04/19 1431    Visit Number 1    Number of Visits 1    PT Start Time 1350    PT Stop Time 1425    PT Time Calculation (min) 35 min    Activity Tolerance Patient tolerated treatment well    Behavior During Therapy [facility] for tasks assessed/performed           Past Medical History:  Diagnosis Date  . Cerebral palsy ([place])   . Hypothyroidism   . 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 Osteoporosis   . Pneumonia   . Profound mental retardation   . Reflux   . Rickets   . Seizures ([place])     Past Surgical History:  Procedure Laterality Date  . FOOT MASS EXCISION Right   . Gastrocutaneous fistual repair  2012  . PEG TUBE PLACEMENT    . PEG TUBE REMOVAL  2012 ?    There were no vitals filed for this visit.    Subjective Assessment - 12/04/19 1349    Subjective Pt mother states that patient has had swelling in her legs since about 10 years ago, she has been wearing compression stockings since that time. She elevates them in the hospital bed and has been participating in exercises a school during the day. Pt continues with fluctuations and her mother has noted edema in the bil thighs. Currently she has been wearing the same stockings since 2019 and her mother has noticed they have lost a lot of the compression ability.    Patient Stated Goals Get measured for new garments.    Currently in Pain? Other (Comment)   Pt demonstrates no grimacing, yelling out in pain though she does vocalize, wincing or excessively touching any particular area of her body demonstrating no signs symptoms of pain.             [facility] PT Assessment - 12/04/19 0001      Assessment    Medical Diagnosis Mild-moderate, BLE, stage II, lymphedema- - onset unsure      Precautions   Precautions Fall   LE Precautions: HYPOTHYROID, LE skin precautions     Restrictions   Weight Bearing Restrictions No      Prior Function   Level of Independence Needs assistance with transfers;Needs assistance with gait;Needs assistance with ADLs;Requires assistive device for independence    Vocation On disability;Other (comment)   day program            LYMPHEDEMA/ONCOLOGY QUESTIONNAIRE - 12/04/19 0001      Lymphedema Assessments   Lymphedema Assessments Lower extremities      Right Lower Extremity Lymphedema   10 cm Proximal to Suprapatella 74.2 cm    At Midpatella/Popliteal Crease 46.5 cm    30 cm Proximal to Floor at Lateral Plantar Foot 50 cm    20 cm Proximal to Floor at Lateral Plantar Foot 38.2 1    10  cm Proximal to Floor at Lateral Malleoli 30.2 cm    5 cm Proximal to 1st MTP Joint 24.4 cm    Across MTP Joint 22.3 cm    Around Proximal Great Toe 9.2 cm    Other 30 cm heel to below the knee  Other 30 cm ankle to the knee       Left Lower Extremity Lymphedema   10 cm Proximal to Suprapatella 71 cm    At Midpatella/Popliteal Crease 41 cm    30 cm Proximal to Floor at Lateral Plantar Foot 49 cm    20 cm Proximal to Floor at Lateral Plantar Foot 38 cm    10 cm Proximal to Floor at Lateral Malleoli 30.2 cm    5 cm Proximal to 1st MTP Joint 24.1 cm    Across MTP Joint 22.8 cm    Around Proximal Great Toe 9.5 cm    Other 32 cm heel to below the knee     Other 29 cm from ankle to knee l                   Objective measurements completed on examination: See above findings.               PT Education - 12/04/19 1455    Education Details Pt and mother were educated on the importance of compressing up to the level of edema in order to adequately move fluid. Discussed use of vasopneuamtic compression device to help move fluid from the Bil LE.     Person(s) Educated Patient    Methods Explanation    Comprehension Verbalized understanding               PT Long Term Goals - 12/04/19 1446      PT LONG TERM GOAL #1   Title Pt will be measured for new flat knit compression garment.    Baseline Pt was measured for flat knit compression    Time 1    Period Days    Status Achieved      PT LONG TERM GOAL #2   Title Pt will be assessed for need of vasopneumatic compression pump to move fluid out of the abdomen and thighs.    Baseline Pt was assessed and will benefit from vasopneumatic compression pump    Time 1    Period Days    Status Achieved                  Plan - 12/04/19 1434    Clinical Impression Statement Pt presents to physical therapy with her mother/caregiver. She has chronic lymphedema in her bil LE from the dorsum of the bil feet to groin with R>L. 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 Pt has been performing compression, elevation and exercise and attending physical therapy off and on since February of 2019. She continues with lymphedema in the bil LE that fluctuates. Pt currently has a garment on that she received back in 2019. Due to difficulty with transportation pt and mother are here to be fitted for compressoin garment this session. Discussed with patient and mother that she will also benefit from vasopneumatic compression garment to the abdominal level due to pt has edema up to her groin and has not seen significant progress with traditional complete decongestive therapy. Pt will have one visit and return as needed. Flexitouch pneumatic compression device is medically necessary for long term in home treatment   Personal Factors and Comorbidities Comorbidity 3+    Comorbidities Cerebral palsy, osteoporosis and hypothyroidism     Stability/Clinical Decision Making Stable/Uncomplicated    Clinical Decision Making  Low    Rehab Potential Good    PT Frequency One time visit    PT Treatment/Interventions Therapeutic activities;Therapeutic exercise;Neuromuscular re-education;Manual techniques    PT Next Visit Plan this is a one time visit    Recommended Other Services New flat knit garments, vasopneumatic compression device.    Consulted and Agree with Plan of Care Patient;Family member/caregiver           Patient will benefit from skilled therapeutic intervention in order to improve the following deficits and impairments:  Increased edema  Visit Diagnosis: Lymphedema, not elsewhere classified     Problem List Patient Active Problem List   Diagnosis Date Noted  . COVID-19   . AKI (acute kidney injury) (HCC) 08/03/2019  . Acute hypoxemic respiratory failure due to COVID-19 (HCC) 08/03/2019  . Pneumonia due to COVID-19 virus 08/01/2019  . Anasarca 07/09/2019  . Allergies 06/02/2019  . Upper respiratory symptom 06/02/2019  . Decreased urination 01/10/2019  . Lennox-Gastaut syndrome (HCC) 07/06/2017  . Lymphedema 01/22/2017  . Bilateral lower extremity edema 04/21/2016  . Unsteadiness on feet   . Health care maintenance 07/20/2014  . Hypernatremia 06/02/2014  . Amenorrhea 05/19/2014  . 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 Morbid obesity (HCC) 12/24/2012  . 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 URINARY INCONTINENCE 02/25/2010  . Full incontinence of feces 02/25/2010  . Hypothyroidism 01/29/2009  . 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 Cerebral palsy (HCC) 01/29/2009  . GASTROESOPHAGEAL REFLUX DISEASE 01/29/2009  . Osteoporosis 01/29/2009  . Seizure disorder (HCC) 01/29/2009    Claudia Desanctis, PT 12/04/2019, 2:56 PM  [facility] [address] [place], [place], [address] Phone:  [phone]   Fax:  [phone]  Name: ARLEAN THIES MRN: [id number] Date of Birth: 1985/12/15

## 2019-12-09 ENCOUNTER — Other Ambulatory Visit: Payer: Self-pay

## 2019-12-09 ENCOUNTER — Ambulatory Visit (INDEPENDENT_AMBULATORY_CARE_PROVIDER_SITE_OTHER): Payer: Medicare Other | Admitting: Family

## 2019-12-09 ENCOUNTER — Encounter (INDEPENDENT_AMBULATORY_CARE_PROVIDER_SITE_OTHER): Payer: Self-pay | Admitting: Family

## 2019-12-09 VITALS — BP 100/72 | HR 68 | Ht 59.0 in | Wt 221.2 lb

## 2019-12-09 DIAGNOSIS — G40814 Lennox-Gastaut syndrome, intractable, without status epilepticus: Secondary | ICD-10-CM

## 2019-12-09 DIAGNOSIS — G808 Other cerebral palsy: Secondary | ICD-10-CM

## 2019-12-09 DIAGNOSIS — G40909 Epilepsy, unspecified, not intractable, without status epilepticus: Secondary | ICD-10-CM | POA: Diagnosis not present

## 2019-12-09 DIAGNOSIS — F73 Profound intellectual disabilities: Secondary | ICD-10-CM

## 2019-12-09 DIAGNOSIS — I890 Lymphedema, not elsewhere classified: Secondary | ICD-10-CM

## 2019-12-09 DIAGNOSIS — R600 Localized edema: Secondary | ICD-10-CM

## 2019-12-09 DIAGNOSIS — G40319 Generalized idiopathic epilepsy and epileptic syndromes, intractable, without status epilepticus: Secondary | ICD-10-CM | POA: Diagnosis not present

## 2019-12-09 MED ORDER — BANZEL 40 MG/ML PO SUSP: ORAL | 5 refills | Status: DC

## 2019-12-09 MED ORDER — TOPAMAX SPRINKLE 25 MG PO CPSP: ORAL_CAPSULE | ORAL | 5 refills | Status: DC

## 2019-12-09 MED ORDER — DEPAKOTE SPRINKLES 125 MG PO CSDR: DELAYED_RELEASE_CAPSULE | ORAL | 5 refills | Status: DC

## 2019-12-09 MED ORDER — LEVOCARNITINE 1 GM/10ML PO SOLN: ORAL | 5 refills | Status: DC

## 2019-12-09 MED ORDER — DIASTAT ACUDIAL 20 MG RE GEL: RECTAL | 5 refills | Status: DC

## 2019-12-09 MED ORDER — KEPPRA 100 MG/ML PO SOLN: ORAL | 5 refills | Status: DC

## 2019-12-09 NOTE — Progress Notes (Signed)
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 She is taking and tolerating brand Depakote, Keppra, Topamax and Banzel for her seizure disorder. Devra typically turns blue and requires blow by oxygen when seizures occur. She is completely dependent upon others for all activities of daily living. She is cared for at home by her mother.  Today's concerns:  Mom reports today that Rashi has had 3 seizures since her last visit, all associated with illness. She was hospitalized for 9 days in February 2021 with Covid-19 infection. Mom said that Floetta did well after discharge and has been vaccinated for Covid-19. She plans to return to the day program on July 1st.   Shatana has been otherwise generally healthy since she was last seen. Mom is compliant with giving Simmone medications and says that she usually sleeps well at night. Mom has no other health concerns for her today other than previously mentioned.   Review of systems: Please see HPI for neurologic and other pertinent review of systems. Otherwise all other systems were reviewed and were negative.  Problem List: Patient Active Problem List   Diagnosis Date Noted  . COVID-19   . AKI (acute kidney injury) (HCC) 08/03/2019  . Acute hypoxemic respiratory failure due to COVID-19 (HCC) 08/03/2019  . Pneumonia due to COVID-19 virus 08/01/2019  . Anasarca 07/09/2019  . Allergies 06/02/2019  . Upper respiratory symptom 06/02/2019  . Decreased urination 01/10/2019  . Lennox-Gastaut syndrome (HCC) 07/06/2017  . Lymphedema 01/22/2017  . Bilateral lower  extremity edema 04/21/2016  . Unsteadiness on feet   . Health care maintenance 07/20/2014  . Hypernatremia 06/02/2014  . Amenorrhea 05/19/2014  . 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 Morbid obesity (HCC) 12/24/2012  . 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 URINARY INCONTINENCE 02/25/2010  . Full incontinence of feces 02/25/2010  . Hypothyroidism 01/29/2009  . 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 Cerebral palsy (HCC) 01/29/2009  . GASTROESOPHAGEAL REFLUX DISEASE 01/29/2009  . Osteoporosis 01/29/2009  . Seizure disorder (HCC) 01/29/2009     Past Medical History:  Diagnosis Date  . Cerebral palsy (HCC)   . Hypothyroidism   . Lennox-Gastaut syndrome (HCC)   . Osteoporosis   . Pneumonia   . Profound mental retardation   . Reflux   . Rickets   . Seizures (HCC)     Past medical history comments: See HPI  Surgical history: Past Surgical History:  Procedure Laterality Date  . FOOT MASS EXCISION Right   . Gastrocutaneous fistual repair  2012  . PEG TUBE PLACEMENT    . PEG TUBE REMOVAL  2012 ?     Family history: family history includes Cancer in her maternal aunt; Diabetes in an other family member; Heart disease in her maternal grandmother and another family member; Hypertension in her mother and paternal grandmother; Kidney disease in an other family member; Stroke in her maternal grandfather.   Social history: Social History   Socioeconomic History  . Marital status: Single    Spouse name: Not on file  . Number of children: 0  . Years of education: Not on file  . Highest education  level: Not on file  Occupational History  . Occupation: Disabled   Tobacco Use  . Smoking status: Never Smoker  . Smokeless tobacco: Never Used  Vaping Use  . Vaping Use: Never used  Substance and Sexual Activity  . Alcohol use: No  . Drug use: No  . Sexual activity: Never  Other Topics Concern  . Not on file  Social History  Narrative   0 caffeine drinks    Lives with mother who helps ADLs.   Graduated from [employer] in 2010. Now attends day program at [employer].    Enjoys playing in water, ripping paper, watching TV, music and going out.   Social Determinants of Health   Financial Resource Strain:   . Difficulty of Paying Living Expenses:   Food Insecurity:   . Worried About Charity fundraiser in the Last Year:   . Arboriculturist in the Last Year:   Transportation Needs:   . Film/video editor (Medical):   Marland Kitchen Lack of Transportation (Non-Medical):   Physical Activity:   . Days of Exercise per Week:   . Minutes of Exercise per Session:   Stress:   . Feeling of Stress :   Social Connections:   . Frequency of Communication with Friends and Family:   . Frequency of Social Gatherings with Friends and Family:   . Attends Religious Services:   . Active Member of Clubs or Organizations:   . Attends Archivist Meetings:   Marland Kitchen Marital Status:   Intimate Partner Violence:   . Fear of Current or Ex-Partner:   . Emotionally Abused:   Marland Kitchen Physically Abused:   . Sexually Abused:     Past/failed meds: Carbamazepine - rash  Allergies: Allergies  Allergen Reactions  . Carbamazepine Rash     Immunizations: Immunization History  Administered Date(s) Administered  . Influenza Split 04/14/2011, 03/22/2012  . Influenza Whole 05/26/2009, 04/04/2010  . Influenza,inj,Quad PF,6+ Mos 04/03/2013, 04/04/2014, 03/08/2015, 03/08/2016, 03/26/2017, 04/12/2018, 03/03/2019  . Tdap 01/14/2014    Diagnostics/Screenings:   Physical Exam: BP 100/72   Pulse 68   Ht 4\' 11"  (1.499 m)   Wt 221 lb 3.2 oz (100.3 kg)   BMI 44.68 kg/m   General: well developed, well nourished obese woman, seated in wheelchair, in no evident distress; black hair, brown eyes, even handed Head: normocephalic and atraumatic. Oropharynx benign. No dysmorphic features. Neck: supple with no carotid bruits. Cardiovascular: regular rate  and rhythm, no murmurs. Respiratory: Clear to auscultation bilaterally Abdomen: Bowel sounds present all four quadrants, abdomen soft, non-tender, non-distended.  Musculoskeletal: No skeletal deformities or obvious scoliosis. Has increased tone in her legs. Skin: no rashes or neurocutaneous lesions  Neurologic Exam Mental Status: Awake and fully alert. Has no language.  Smiles responsively. Claps her hands eagerly through much of the visit. Tolerant of invasions into her space Cranial Nerves: Fundoscopic exam - red reflex present.  Unable to fully visualize fundus.  Pupils equal briskly reactive to light.  Turns to localize faces and objects in the periphery. Turns to localize sounds in the periphery. Facial movements are symmetric. Motor: Normal functional bulk, tone and strength in the arms and increased tone in the legs. Sensory: Withdrawal x 4 Coordination: Unable to adequately assess due to patient's inability to participate in examination. No dysmetria when reaching for objects. Gait and Station: Unable to stand and bear weight. Reflexes: Diminished and symmetric. Toes neutral. No clonus  Impression: 1. Lennox Gastaut encephalopathy 2. Generalized non-convulsive  and convulsive epilepsy 3. Increased tone in the legs 4. Significant developmental and intellectual delays  Recommendations for plan of care: The patient's previous [facility] records were reviewed. Tangie has neither had nor required imaging or lab studies since the last visit, other than what was performed at her hospitalization in February. She is a 34 year old woman with history of Lennox-Gastaut encephalopathy, generalized non-convulsive and convulsive epilepsy, increased tone in her legs, and significant developmental and intellectual delays. She is taking and tolerating Depakote, Keppra, Topamax and Banzel for her seizure disorder. Uniqua had some seizures associated with illness but has been otherwise doing well. Mom asked for a  note for Fawne to return to day program on July 1st, and I will provide that for her. I asked Mom to let me know if Aynslee has any seizures or if she has any concerns. I will see her back in follow up in 6 months or sooner if needed. Mom agreed with the plans made today.  The medication list was reviewed and reconciled. No changes were made in the prescribed medications today. A complete medication list was provided to the patient.  Allergies as of 12/09/2019      Reactions   Carbamazepine Rash      Medication List       Accurate as of December 09, 2019 11:59 PM. If you have any questions, ask your nurse or doctor.        acetaminophen 650 MG suppository Commonly known as: TYLENOL Place 650 mg rectally every 8 (eight) hours as needed for mild pain.   acetaminophen 500 MG tablet Commonly known as: TYLENOL Take 500 mg by mouth every 6 (six) hours as needed for fever.   Banzel 40 MG/ML Susp Generic drug: Rufinamide Take 2ml in the morning and 32ml at night What changed:   how much to take  how to take this  when to take this   Calcarb 600/D 600-400 MG-UNIT tablet Generic drug: Calcium Carbonate-Vitamin D Take 1 tablet by mouth daily. Only gives once a week.   Depakote Sprinkles 125 MG capsule Generic drug: divalproex TAKE 8 CAPSULES BY MOUTH TWICE DAILY What changed:   how much to take  how to take this  when to take this   Diastat AcuDial 20 MG Gel Generic drug: diazepam DIAL TO 15 MG, GIVE RECTALLY FOR SEIZURES LASTING 2 MINUTES OR LONGER   feeding supplement (ENSURE ENLIVE) Liqd Take 237 mLs by mouth 3 (three) times daily between meals.   fluticasone 50 MCG/ACT nasal spray Commonly known as: FLONASE Place 2 sprays into both nostrils 2 (two) times daily. poonam patel MD What changed: when to take this   furosemide 20 MG tablet Commonly known as: LASIX TAKE 1 TABLET BY MOUTH AS NEEDED FOR SWELLING   hydrocortisone cream 0.5 % Apply 1 application topically 2  (two) times daily. What changed:   when to take this  reasons to take this   ibuprofen 200 MG tablet Commonly known as: ADVIL Take 400 mg by mouth as needed for moderate pain.   Keppra 100 MG/ML solution Generic drug: levETIRAcetam Take 73ml by mouth every morning and 105ml by mouth at night   lactulose 10 GM/15ML solution Commonly known as: CHRONULAC Take 30 ml twice daily   levOCARNitine 1 GM/10ML solution Commonly known as: CARNITOR TAKE 5 MLS BY MOUTH THREE TIMES DAILY What changed:   how much to take  how to take this  when to take this   liver  oil-zinc oxide 40 % ointment Commonly known as: DESITIN Apply 1 application topically daily.   nystatin powder Commonly known as: nystatin APPLY TOPICALLY TO AFFECTED DIAPER AREAS WITH EVERY DIAPER CHANGE UNTIL RASH CLEARS   pantoprazole sodium 40 mg/20 mL Pack Commonly known as: PROTONIX MIX 1 PACK AND TAKE DAILY AS DIRECTED   Topamax Sprinkle 25 MG capsule Generic drug: topiramate TAKE 6 CAPSULES BY MOUTH IN THE MORNING AND 6 CAPSULES BY MOUTH IN THE EVENING WITH FOOD What changed:   how much to take  how to take this  when to take this       Total time spent with the patient was 20 minutes, of which 50% or more was spent in counseling and coordination of care.  Elveria Rising NP-C [facility] Ph. [phone] Fax [phone]

## 2019-12-13 ENCOUNTER — Encounter (INDEPENDENT_AMBULATORY_CARE_PROVIDER_SITE_OTHER): Payer: Self-pay | Admitting: Family

## 2019-12-13 NOTE — Patient Instructions (Signed)
Thank you for coming in today.   Instructions for you until your next appointment are as follows: 1. Continue giving Michele [name] medications as you have been giving them.  2. Let me know if she has any seizures or if you have any concerns 3. I have written a letter for her to return to the day program as of July 1st  4. Please sign up for MyChart if you have not done so 5. Please plan to return for follow up in 6 months or sooner if needed.

## 2020-01-05 ENCOUNTER — Other Ambulatory Visit: Payer: Self-pay | Admitting: Family Medicine

## 2020-01-06 ENCOUNTER — Other Ambulatory Visit: Payer: Self-pay

## 2020-01-06 ENCOUNTER — Ambulatory Visit: Payer: Medicare Other | Attending: Family Medicine | Admitting: Rehabilitation

## 2020-01-06 ENCOUNTER — Encounter: Payer: Self-pay | Admitting: Rehabilitation

## 2020-01-06 DIAGNOSIS — I890 Lymphedema, not elsewhere classified: Secondary | ICD-10-CM | POA: Insufficient documentation

## 2020-01-06 NOTE — Therapy (Signed)
[facility] [address] [place], [place], [address] Phone: [phone]   Fax:  [phone]  Physical Therapy Treatment  Patient Details  Name: Michele [name] MRN: [id number] Date of Birth: 25-Jul-1985 No data recorded  Encounter Date: 01/06/2020   PT End of Session - 01/06/20 0818    Visit Number 2    PT Start Time 0800    PT Stop Time 0815    PT Time Calculation (min) 15 min    Activity Tolerance Patient tolerated treatment well    Behavior During Therapy [facility] for tasks assessed/performed           Past Medical History:  Diagnosis Date  . Cerebral palsy (HCC)   . Hypothyroidism   . Lennox-Gastaut syndrome (HCC)   . Osteoporosis   . Pneumonia   . Profound mental retardation   . Reflux   . Rickets   . Seizures (HCC)     Past Surgical History:  Procedure Laterality Date  . FOOT MASS EXCISION Right   . Gastrocutaneous fistual repair  2012  . PEG TUBE PLACEMENT    . PEG TUBE REMOVAL  2012 ?    There were no vitals filed for this visit.   Subjective Assessment - 01/06/20 0801    Subjective No new complaints    Patient Stated Goals Get measured for new garments.    Currently in Pain? No/denies                 LYMPHEDEMA/ONCOLOGY QUESTIONNAIRE - 01/06/20 0001      Right Lower Extremity Lymphedema   10 cm Proximal to Suprapatella 74.2 cm    At Midpatella/Popliteal Crease 52.3 cm    30 cm Proximal to Floor at Lateral Plantar Foot 49.5 cm    20 cm Proximal to Floor at Lateral Plantar Foot 40.5 1    10  cm Proximal to Floor at Lateral Malleoli 30.2 cm    5 cm Proximal to 1st MTP Joint 24 cm    Across MTP Joint 22.4 cm    Around Proximal Great Toe 9.2 cm      Left Lower Extremity Lymphedema   10 cm Proximal to Suprapatella 75 cm    At Midpatella/Popliteal Crease 42 cm    30 cm Proximal to Floor at Lateral Plantar Foot 48 cm    20 cm Proximal to Floor at Lateral Plantar Foot 40.2 cm    10 cm Proximal  to Floor at Lateral Malleoli 29.8 cm    5 cm Proximal to 1st MTP Joint 23.9 cm    Across MTP Joint 22.5 cm    Around Proximal Great Toe 8.9 cm                      [facility] Adult PT Treatment/Exercise - 01/06/20 0001      Manual Therapy   Manual Therapy Edema management    Edema Management remeasured for flexitouch update.  Gave pt new rosidal soft with ordering information for more as needed.  Will be in contact with evaluating PT regarding garment ordering.                        PT Long Term Goals - 01/06/20 0258      PT LONG TERM GOAL #1   Title Pt will be measured for new flat knit compression garment.    Status Achieved      PT LONG TERM GOAL #2  Title Pt will be assessed for need of vasopneumatic compression pump to move fluid out of the abdomen and thighs.    Status Achieved                 Plan - 01/06/20 0818    Clinical Impression Statement Pt returns 4 weeks post initial evaluation without significant changes to the circumferential measurements with home care with aide.  Pt continues with chronic lymphedema bil LEs from feet to groin with skin thickening, hyperplasia, and risk for skin breakdown due to lymphedema and Wheelchair status.  Pt will benefit from Pneumatic compression device for home treatment.    Personal Factors and Comorbidities Comorbidity 3+    Comorbidities Cerebral palsy, osteoporosis and hypothyroidism    PT Frequency One time visit    PT Treatment/Interventions Therapeutic activities;Therapeutic exercise;Neuromuscular re-education;Manual techniques    Consulted and Agree with Plan of Care Patient;Family member/caregiver           Patient will benefit from skilled therapeutic intervention in order to improve the following deficits and impairments:     Visit Diagnosis: Lymphedema, not elsewhere classified     Problem List Patient Active Problem List   Diagnosis Date Noted  . COVID-19   . AKI (acute kidney  injury) (HCC) 08/03/2019  . Acute hypoxemic respiratory failure due to COVID-19 (HCC) 08/03/2019  . Pneumonia due to COVID-19 virus 08/01/2019  . Anasarca 07/09/2019  . Allergies 06/02/2019  . Upper respiratory symptom 06/02/2019  . Decreased urination 01/10/2019  . Lennox-Gastaut syndrome (HCC) 07/06/2017  . Lymphedema 01/22/2017  . Bilateral lower extremity edema 04/21/2016  . Unsteadiness on feet   . Health care maintenance 07/20/2014  . Hypernatremia 06/02/2014  . Amenorrhea 05/19/2014  . 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 Morbid obesity (HCC) 12/24/2012  . 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 URINARY INCONTINENCE 02/25/2010  . Full incontinence of feces 02/25/2010  . Hypothyroidism 01/29/2009  . 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 Cerebral palsy (HCC) 01/29/2009  . GASTROESOPHAGEAL REFLUX DISEASE 01/29/2009  . Osteoporosis 01/29/2009  . Seizure disorder [facility]) 01/29/2009   Gwenevere Abbot, PT  01/06/2020, 8:24 AM  [facility] [address] [place], [place], [address] Phone: [phone]   Fax:  [phone]  Name: RYLEY TEATER MRN: [id number] Date of Birth: [birth year]

## 2020-01-07 ENCOUNTER — Other Ambulatory Visit (INDEPENDENT_AMBULATORY_CARE_PROVIDER_SITE_OTHER): Payer: Self-pay | Admitting: Family

## 2020-01-07 DIAGNOSIS — G40319 Generalized idiopathic epilepsy and epileptic syndromes, intractable, without status epilepticus: Secondary | ICD-10-CM

## 2020-01-07 NOTE — Telephone Encounter (Signed)
Please send to the pharmacy °

## 2020-02-06 ENCOUNTER — Other Ambulatory Visit: Payer: Self-pay | Admitting: Family Medicine

## 2020-02-06 DIAGNOSIS — R600 Localized edema: Secondary | ICD-10-CM

## 2020-02-11 ENCOUNTER — Other Ambulatory Visit: Payer: Self-pay

## 2020-02-11 ENCOUNTER — Ambulatory Visit (INDEPENDENT_AMBULATORY_CARE_PROVIDER_SITE_OTHER): Payer: Medicare Other | Admitting: Family Medicine

## 2020-02-11 VITALS — BP 122/76 | HR 78 | Ht 59.0 in | Wt 221.6 lb

## 2020-02-11 DIAGNOSIS — T148XXA Other injury of unspecified body region, initial encounter: Secondary | ICD-10-CM

## 2020-02-11 NOTE — Patient Instructions (Signed)
Thank you for coming to see me today. It was a pleasure.   Will do some blood work today.  Do not take any Ibuprofen or NSAIDS  Please follow-up with PCP if symptoms worsen  If you have any questions or concerns, please do not hesitate to call the office at [phone].  Best,   Dana Allan, MD Family Medicine Residency

## 2020-02-11 NOTE — Progress Notes (Signed)
Initial O2 saturation 93%. Patient has cold finger tips and had difficulty picking up reading. Dr. Clent Ridges made aware and requested additional Sp02 monitoring.  Applied pediatric probe to left ear and obtained 95% SpO2 on Room Air. Patient is breathing normally and is showing no signs of respiratory distress.

## 2020-02-11 NOTE — Progress Notes (Signed)
° ° °  SUBJECTIVE:   CHIEF COMPLAINT / HPI:  bruising  History obtained by mom  Mom reports an increase in bruising over the past week.  She reports that Chelcea has started to become more active at daycare, walking more.  She has also been playing with her nieces who have since went back home.  Denies any recent trauma, seizures or obvious signs of bleeding.  She is otherwise doing fine.  Mom was concerned since Roibin had been hospitalized with COVID last year and was wondering if the bruising was from this.  Mom also reports that she has had bruising in then past but has resolved.    PERTINENT  PMH / PSH:  Seizures Cognitive impairment  OBJECTIVE:   BP 122/76    Pulse 78    Ht 4\' 11"  (1.499 m)    Wt 221 lb 9.6 oz (100.5 kg)    SpO2 95%    BMI 44.76 kg/m    General: Alert and oriented, no apparent distress  Cardiovascular: RRR with no murmurs noted Respiratory: CTA bilaterally  Derm: two areas of ecchymosis noted approx .64mm, one on left and one on right forearm, small faint bruising area on left knee     ASSESSMENT/PLAN:   Bruise Three small areas noted on exam all faint and resolving.  Likely secondary to increase in activity and anti seizure medication could be contributing factor.  Less likely bleeding disorder given not family history and no signs of increased bleeding.  Last coag studies in 02/21 all wnl -CBC today wnl -Advise to monitor for any increasing in bruising and not resolving  -Follow up with PCP as needed      3/21, MD [facility]

## 2020-02-12 LAB — CBC WITH DIFFERENTIAL/PLATELET
Basophils Absolute: 0 10*3/uL (ref 0.0–0.2)
Basos: 0 %
EOS (ABSOLUTE): 0.1 10*3/uL (ref 0.0–0.4)
Eos: 1 %
Hematocrit: 41 % (ref 34.0–46.6)
Hemoglobin: 13.4 g/dL (ref 11.1–15.9)
Immature Grans (Abs): 0.1 10*3/uL (ref 0.0–0.1)
Immature Granulocytes: 1 %
Lymphocytes Absolute: 4.8 10*3/uL — ABNORMAL HIGH (ref 0.7–3.1)
Lymphs: 45 %
MCH: 31.2 pg (ref 26.6–33.0)
MCHC: 32.7 g/dL (ref 31.5–35.7)
MCV: 96 fL (ref 79–97)
Monocytes Absolute: 1.1 10*3/uL — ABNORMAL HIGH (ref 0.1–0.9)
Monocytes: 10 %
Neutrophils Absolute: 4.5 10*3/uL (ref 1.4–7.0)
Neutrophils: 43 %
Platelets: 188 10*3/uL (ref 150–450)
RBC: 4.29 x10E6/uL (ref 3.77–5.28)
RDW: 14.6 % (ref 11.7–15.4)
WBC: 10.6 10*3/uL (ref 3.4–10.8)

## 2020-02-15 ENCOUNTER — Encounter: Payer: Self-pay | Admitting: Family Medicine

## 2020-02-15 DIAGNOSIS — T148XXA Other injury of unspecified body region, initial encounter: Secondary | ICD-10-CM | POA: Insufficient documentation

## 2020-02-15 NOTE — Assessment & Plan Note (Signed)
Three small areas noted on exam all faint and resolving.  Likely secondary to increase in activity and anti seizure medication could be contributing factor.  Less likely bleeding disorder given not family history and no signs of increased bleeding.  Last coag studies in 02/21 all wnl -CBC today wnl -Advise to monitor for any increasing in bruising and not resolving  -Follow up with PCP as needed

## 2020-03-23 ENCOUNTER — Ambulatory Visit: Payer: Medicare Other | Attending: Family Medicine | Admitting: Rehabilitation

## 2020-03-23 ENCOUNTER — Other Ambulatory Visit: Payer: Self-pay

## 2020-03-23 ENCOUNTER — Encounter: Payer: Self-pay | Admitting: Rehabilitation

## 2020-03-23 DIAGNOSIS — I890 Lymphedema, not elsewhere classified: Secondary | ICD-10-CM | POA: Insufficient documentation

## 2020-03-23 NOTE — Therapy (Signed)
[facility] [address] [place], [place], [address] Phone: [phone]   Fax:  [phone]  Physical Therapy Treatment  Patient Details  Name: Michele [name] MRN: [id number] Date of Birth: 07/22/1985 No data recorded  Encounter Date: 03/23/2020   PT End of Session - 03/23/20 0821    Visit Number 3    PT Start Time 0800    PT Stop Time 0818    PT Time Calculation (min) 18 min    Activity Tolerance Patient tolerated treatment well    Behavior During Therapy [facility] for tasks assessed/performed           Past Medical History:  Diagnosis Date  . Cerebral palsy (HCC)   . Hypothyroidism   . Lennox-Gastaut syndrome (HCC)   . Osteoporosis   . Pneumonia   . Profound mental retardation   . Reflux   . Rickets   . Seizures (HCC)     Past Surgical History:  Procedure Laterality Date  . FOOT MASS EXCISION Right   . Gastrocutaneous fistual repair  2012  . PEG TUBE PLACEMENT    . PEG TUBE REMOVAL  2012 ?    There were no vitals filed for this visit.   Subjective Assessment - 03/23/20 0819    Subjective Pt arrives with new garments to check fit    Currently in Pain? No/denies   per mother                            [facility] Adult PT Treatment/Exercise - 03/23/20 0001      Manual Therapy   Manual Therapy Edema management    Edema Management assessed fit of new jobst relax garments and class 3 knee high garments which all fit very well.  pt also received some sigvaris compreshorts which mom will try on when they get dressed next time.  Contacted Christopher at [employer] about ordering a second daytime garment for each leg.                        PT Long Term Goals - 03/23/20 8242      PT LONG TERM GOAL #1   Title Pt will be measured for new flat knit compression garment.    Status Achieved      PT LONG TERM GOAL #2   Title Pt will be assessed for need of vasopneumatic compression pump to move  fluid out of the abdomen and thighs.    Status Achieved                 Plan - 03/23/20 3536    Clinical Impression Statement Pt has finally received garments after trouble getting them ordered through [facility] orthotics.  Garments seems to fit well and a second daytime garment will be ordered.    PT Frequency --   check in as needed   PT Duration --   6 months   PT Treatment/Interventions ADLs/Self Care Home Management;Patient/family education;Orthotic Fit/Training    PT Next Visit Plan recheck as needed    Consulted and Agree with Plan of Care Patient;Family member/caregiver           Patient will benefit from skilled therapeutic intervention in order to improve the following deficits and impairments:     Visit Diagnosis: Lymphedema, not elsewhere classified     Problem List Patient Active Problem List   Diagnosis Date Noted  . Bruise 02/15/2020  .  COVID-19   . AKI (acute kidney injury) (HCC) 08/03/2019  . Acute hypoxemic respiratory failure due to COVID-19 (HCC) 08/03/2019  . Pneumonia due to COVID-19 virus 08/01/2019  . Anasarca 07/09/2019  . Allergies 06/02/2019  . Upper respiratory symptom 06/02/2019  . Decreased urination 01/10/2019  . Lennox-Gastaut syndrome (HCC) 07/06/2017  . Lymphedema 01/22/2017  . Bilateral lower extremity edema 04/21/2016  . Unsteadiness on feet   . Health care maintenance 07/20/2014  . Hypernatremia 06/02/2014  . Amenorrhea 05/19/2014  . 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 Morbid obesity (HCC) 12/24/2012  . 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 URINARY INCONTINENCE 02/25/2010  . Full incontinence of feces 02/25/2010  . Hypothyroidism 01/29/2009  . 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 Cerebral palsy (HCC) 01/29/2009  . GASTROESOPHAGEAL REFLUX DISEASE 01/29/2009  . Osteoporosis 01/29/2009  . Seizure disorder (HCC) 01/29/2009    Idamae Lusher 03/23/2020, 8:23  AM  [facility] [address] [place], [place], [address] Phone: [phone]   Fax:  [phone]  Name: Michele [name] MRN: [id number] Date of Birth: 03/27/1986

## 2020-03-24 ENCOUNTER — Telehealth: Payer: Self-pay | Admitting: Rehabilitation

## 2020-03-24 NOTE — Progress Notes (Signed)
    SUBJECTIVE:   CHIEF COMPLAINT / HPI:   Michele [name] is a 34 yr old female who presents with her mother for general follow-up and flu shot  Lymphadema Patient takes 20mg  Lasix PRN 3 times a week which helps with lymphedema.  Her mom she also uses 3 different types of compression stockings and shorts to help with the edema.  Denies chest pain, dyspnea, cough or fevers.    Post Covid  Patient had Covid approximately 8 months ago.  She was discharged with home oxygen. Per mom her oxygen levels dropped to the 80s sometimes and she uses 1.5 L of oxygen 2-3 times a week.  Overall she is doing better.  Health maintenance COVID vaccine: Received Pfizer vaccine in May and June 2021 Flu vaccine: Will receive today Pap smear: Never been done, patient has never been sexually active  PERTINENT  PMH / PSH: Lymphedema, hypothyroidism, cerebral palsy, lennox-gastaut syndrome   OBJECTIVE:   BP 113/70   Pulse 74   LMP 03/13/2020 (Approximate)   SpO2 91%    General: Alert, pleasant, clapping, no acute distress Cardio: Normal S1 and S2, RRR Pulm: Clear to auscultation bilaterally, no crackles, wheezing, or diminished breath sounds. Normal respiratory effort Abdomen: Bowel sounds normal. Abdomen soft and non-tender.  Extremities: Mild peripheral edema, warm/ well perfused.   Neuro: Cranial nerves grossly intact  ASSESSMENT/PLAN:   Lymphedema Lymphedema is stable.Continue compression stockings, elevation of lower extremities and Lasix 20 mg as needed is helping.  Follow-up in 2 to 3 months.  Health care maintenance Received flu shot today.    Cervical cancer screening Discussed with mom the pros and cons of getting pap smear. Patient has never been sexually active and has never had a Pap smear in the past. It would be traumatizing for the patient to have a pap smear and would require hoisting onto the examination table (our clinic does not offer this service) and would require pt to be still.  Overall trauma related to obtaining pap smear outweighs the benefits so will not proceed with pap. Discussed this with mom who is happy with the plan and in agreement.     03/15/2020, MD PGY 2 [facility]

## 2020-03-26 ENCOUNTER — Encounter: Payer: Self-pay | Admitting: Family Medicine

## 2020-03-26 ENCOUNTER — Other Ambulatory Visit: Payer: Self-pay

## 2020-03-26 ENCOUNTER — Ambulatory Visit (INDEPENDENT_AMBULATORY_CARE_PROVIDER_SITE_OTHER): Payer: Medicare Other | Admitting: Family Medicine

## 2020-03-26 VITALS — BP 113/70 | HR 74

## 2020-03-26 DIAGNOSIS — Z0000 Encounter for general adult medical examination without abnormal findings: Secondary | ICD-10-CM | POA: Diagnosis not present

## 2020-03-26 DIAGNOSIS — Z23 Encounter for immunization: Secondary | ICD-10-CM | POA: Diagnosis not present

## 2020-03-26 DIAGNOSIS — Z124 Encounter for screening for malignant neoplasm of cervix: Secondary | ICD-10-CM | POA: Insufficient documentation

## 2020-03-26 DIAGNOSIS — I890 Lymphedema, not elsewhere classified: Secondary | ICD-10-CM

## 2020-03-26 MED ORDER — NYSTATIN 100000 UNIT/GM EX POWD: CUTANEOUS | 0 refills | Status: DC

## 2020-03-26 NOTE — Assessment & Plan Note (Signed)
Lymphedema is stable.Continue compression stockings, elevation of lower extremities and Lasix 20 mg as needed is helping.  Follow-up in 2 to 3 months.

## 2020-03-26 NOTE — Patient Instructions (Signed)
  Michele [name], it was great to see you today!!  I am glad to see that you are doing well.  I did not make any changes to medications today I have refilled nystatin powder to prevent fungal infections in the groin.  He received a flu shot today.  You can take Tylenol and ibuprofen if you get fevers > 100.5.  If you develop vomiting, drowsiness, shortness of breath etc. please go to the ER.  I explained to mom to keep an eye on the oxygen saturations at night and if they go up then she should bring you back to the clinic.  You do not need to have a Pap smear as I discussed with your mom in clinic today.   See you soon for a follow-up  Best wishes Dr. Allena Katz

## 2020-03-26 NOTE — Assessment & Plan Note (Addendum)
Discussed with mom the pros and cons of getting pap smear. Patient has never been sexually active and has never had a Pap smear in the past. It would be traumatizing for the patient to have a pap smear and would require hoisting onto the examination table (our clinic does not offer this service) and would require pt to be still. Overall trauma related to obtaining pap smear outweighs the benefits so will not proceed with pap. Discussed this with mom who is happy with the plan and in agreement.

## 2020-03-26 NOTE — Assessment & Plan Note (Addendum)
Received flu shot today. 

## 2020-04-26 ENCOUNTER — Telehealth: Payer: Self-pay | Admitting: *Deleted

## 2020-04-26 ENCOUNTER — Telehealth: Payer: Self-pay | Admitting: Family Medicine

## 2020-04-26 NOTE — Telephone Encounter (Signed)
Called mom to let her know I have not received paperwork for ensure but I will check my inbox again today. They can refax the paperwork and I am happy to fill it out.  Towanda Octave MD PGY-2, [facility] Family Medicine

## 2020-04-26 NOTE — Telephone Encounter (Signed)
I checked my inbox on Friday but I do not recall seeing that form. I can check again today. I happy to do it if they can fax it again.

## 2020-04-26 NOTE — Telephone Encounter (Signed)
Mother called referral line inquiring about a form that was faxed from patient's case worker for her ensure.  I checked provider's box and it was not in there as of today.  Will send to MD to see if she has already completed the forms and faxed them back.  Please call mother with any questions.  Adedamola Seto,CMA

## 2020-04-27 ENCOUNTER — Telehealth: Payer: Self-pay | Admitting: Family Medicine

## 2020-04-27 NOTE — Telephone Encounter (Signed)
Michele [name]) walked in requesting correction to refill of:  Name of Medication(s):  Lactulose 10  Solution Last date of OV: 03/26/20   Michele [name] stated the wrong amount was prescribed and it needs to be a 30 day supply.  Will route refill request to Clinic RN.  Discussed with patient policy to call pharmacy for future refills.  Also, discussed refills may take up to 48 hours to approve or deny.  Michele [name]  Care giver is also inquiring about the Ensure referral.   Stated will contact DSS if concerns are not corrected

## 2020-04-28 ENCOUNTER — Other Ambulatory Visit: Payer: Self-pay | Admitting: Family Medicine

## 2020-04-28 MED ORDER — LACTULOSE 10 GM/15ML PO SOLN: 20.0000 g | Freq: Two times a day (BID) | ORAL | 3 refills | Status: DC

## 2020-04-28 NOTE — Telephone Encounter (Signed)
Refilled lactulose which is a 30 day supply with 3 refills. I have not received a form for refill for ensure shakes. I am happy to prescribe this once the form comes in. Please inform patient's mom, thank you.

## 2020-04-29 NOTE — Telephone Encounter (Signed)
Mom informed. Michele [name], CMA  

## 2020-04-29 NOTE — Telephone Encounter (Signed)
Mom Art gallery manager. Sunday Spillers, CMA

## 2020-05-03 ENCOUNTER — Telehealth: Payer: Self-pay | Admitting: Family Medicine

## 2020-05-03 NOTE — Telephone Encounter (Signed)
Certificate of Medical Necessity form dropped off for at front desk for completion.  Verified that patient section of form has been completed.  Last DOS/WCC with PCP was 03/26/20.  Placed form in team folder to be completed by clinical staff.  Michele [name]

## 2020-05-06 ENCOUNTER — Other Ambulatory Visit: Payer: Self-pay | Admitting: Family Medicine

## 2020-05-06 DIAGNOSIS — R600 Localized edema: Secondary | ICD-10-CM

## 2020-05-07 ENCOUNTER — Telehealth: Payer: Self-pay | Admitting: Family Medicine

## 2020-05-07 NOTE — Telephone Encounter (Signed)
Mother walked in to inquire about the Ensure referral for the pt.   Pls call [phone]

## 2020-05-07 NOTE — Telephone Encounter (Signed)
Clinical info completed on Medical Necessity form.  Place form in Dr Eliane Decree box for completion.  Sunday Spillers, CMA

## 2020-05-10 NOTE — Telephone Encounter (Signed)
Form has been put up front for pick up. Mom has been notified. Sunday Spillers, CMA

## 2020-06-01 DIAGNOSIS — Z23 Encounter for immunization: Secondary | ICD-10-CM | POA: Diagnosis not present

## 2020-06-06 ENCOUNTER — Other Ambulatory Visit: Payer: Self-pay

## 2020-06-06 ENCOUNTER — Encounter (HOSPITAL_COMMUNITY): Payer: Self-pay

## 2020-06-06 ENCOUNTER — Ambulatory Visit (HOSPITAL_COMMUNITY)
Admission: EM | Admit: 2020-06-06 | Discharge: 2020-06-06 | Disposition: A | Discharge status: Home / Self Care | Payer: Medicare Other | Attending: Student | Admitting: Student

## 2020-06-06 DIAGNOSIS — Z20822 Contact with and (suspected) exposure to covid-19: Secondary | ICD-10-CM | POA: Diagnosis not present

## 2020-06-06 DIAGNOSIS — J302 Other seasonal allergic rhinitis: Secondary | ICD-10-CM | POA: Insufficient documentation

## 2020-06-06 LAB — RESP PANEL BY RT-PCR (FLU A&B, COVID) ARPGX2
Influenza A by PCR: NEGATIVE
Influenza B by PCR: NEGATIVE
SARS Coronavirus 2 by RT PCR: NEGATIVE

## 2020-06-06 NOTE — ED Provider Notes (Signed)
[facility]    CSN: [id number] Arrival date & time: 06/06/20  1033      History   Chief Complaint Chief Complaint  Patient presents with  . congestion    HPI Michele [name] is a 34 y.o. female presenting for congestion for 3 days, as well as bags under her eyes. Pt is nonverbal and is here with caregiver today. Caregiver states that patient has had slight clear nasal congestion for 2-3 days, as well as her eyes have had dark circles under them. Pt has seasonal allergies, which caregiver thinks are contributing. They have been using Flonase with minimal improvement in symptoms. Pt is fully vaccinated for covid-19 and has also had covid-19 in the past requiring hospitalization. Denies fevers/chills, n/v/d, shortness of breath, chest pain, cough, facial pain, teeth pain, headaches, sore throat, loss of taste/smell, swollen lymph nodes, ear pain.    HPI  Past Medical History:  Diagnosis Date  . Cerebral palsy (HCC)   . Hypothyroidism   . Lennox-Gastaut syndrome (HCC)   . Osteoporosis   . Pneumonia   . Profound mental retardation   . Reflux   . Rickets   . Seizures [facility])     Patient Active Problem List   Diagnosis Date Noted  . Cervical cancer screening 03/26/2020  . Bruise 02/15/2020  . COVID-19   . AKI (acute kidney injury) (HCC) 08/03/2019  . Acute hypoxemic respiratory failure due to COVID-19 (HCC) 08/03/2019  . Pneumonia due to COVID-19 virus 08/01/2019  . Anasarca 07/09/2019  . Allergies 06/02/2019  . Upper respiratory symptom 06/02/2019  . Decreased urination 01/10/2019  . Lennox-Gastaut syndrome (HCC) 07/06/2017  . Lymphedema 01/22/2017  . Bilateral lower extremity edema 04/21/2016  . Unsteadiness on feet   . Health care maintenance 07/20/2014  . Hypernatremia 06/02/2014  . Amenorrhea 05/19/2014  . 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 Morbid obesity (HCC) 12/24/2012  . Congenital quadriplegia (HCC)  12/24/2012  . URINARY INCONTINENCE 02/25/2010  . Full incontinence of feces 02/25/2010  . Hypothyroidism 01/29/2009  . 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 Cerebral palsy (HCC) 01/29/2009  . GASTROESOPHAGEAL REFLUX DISEASE 01/29/2009  . Osteoporosis 01/29/2009  . Seizure disorder (HCC) 01/29/2009    Past Surgical History:  Procedure Laterality Date  . FOOT MASS EXCISION Right   . Gastrocutaneous fistual repair  2012  . PEG TUBE PLACEMENT    . PEG TUBE REMOVAL  2012 ?    OB History   No obstetric history on file.      Home Medications    Prior to Admission medications   Medication Sig Start Date End Date Taking? Authorizing Provider  acetaminophen (TYLENOL) 500 MG tablet Take 500 mg by mouth every 6 (six) hours as needed for fever.    [provider]  acetaminophen (TYLENOL) 650 MG suppository Place 650 mg rectally every 8 (eight) hours as needed for mild pain.    [provider]  Calcium Carbonate-Vitamin D (CALCARB 600/D) 600-400 MG-UNIT tablet Take 1 tablet by mouth daily. Only gives once a week.    [provider]  DEPAKOTE SPRINKLES 125 MG capsule TAKE 8 CAPSULES BY MOUTH TWICE DAILY 12/09/19   Elveria Rising, NP  DIASTAT ACUDIAL 20 MG GEL DIAL TO 15 MG, GIVE RECTALLY FOR SEIZURES LASTING 2 MINUTES OR LONGER 12/09/19   Elveria Rising, NP  feeding supplement, ENSURE ENLIVE, (ENSURE ENLIVE) LIQD Take 237 mLs by mouth 3 (three) times daily between meals. 05/30/19   Allena Katz,  Poonam, MD  furosemide (LASIX) 20 MG tablet TAKE 1 TABLET BY MOUTH EVERY DAY AS NEEDED FOR SWELLING 05/07/20   Welborn, Ryan, DO  KEPPRA 100 MG/ML solution TAKE 12 MLS BY MOUTH EVERY MORNING AND 12 MLS AT NIGHT 01/07/20   Elveria Rising, NP  lactulose (CHRONULAC) 10 GM/15ML solution Take 30 mLs (20 g total) by mouth 2 (two) times daily. Take 30 ml twice daily 04/28/20 10/25/20  Towanda Octave, MD  levOCARNitine (CARNITOR) 1 GM/10ML solution TAKE 5 MLS BY MOUTH THREE  TIMES DAILY 12/09/19   Elveria Rising, NP  liver oil-zinc oxide (DESITIN) 40 % ointment Apply 1 application topically daily.    [provider]  nystatin (NYSTATIN) powder APPLY TOPICALLY TO AFFECTED DIAPER AREAS WITH EVERY DIAPER CHANGE UNTIL RASH CLEARS 03/26/20   Towanda Octave, MD  pantoprazole sodium (PROTONIX) 40 mg/20 mL PACK MIX 1 PACK AND TAKE DAILY AS DIRECTED 11/19/19   Towanda Octave, MD  TOPAMAX SPRINKLE 25 MG capsule TAKE 6 CAPSULES BY MOUTH EVERY MORNING AND 6 CAPSULES IN THE EVENING WITH FOOD 01/07/20   Elveria Rising, NP  Mariane Duval 40 MG/ML SUSP Take 79ml in the morning and 61ml at night 12/09/19 06/06/20  Elveria Rising, NP  fluticasone (FLONASE) 50 MCG/ACT nasal spray Place 2 sprays into both nostrils 2 (two) times daily. poonam patel MD Patient taking differently: Place 2 sprays into both nostrils daily. poonam patel MD 05/30/19 06/06/20  Towanda Octave, MD    Family History Family History  Problem Relation Age of Onset  . Hypertension Mother   . Heart disease Maternal Grandmother        Died at 16  . Stroke Maternal Grandfather        died at 71  . Hypertension Paternal Grandmother   . Diabetes Other        MGGM  . Heart disease Other        MGGF  . Kidney disease Other        Maternal Great Aunt   . Cancer Maternal Aunt        lung  . Colon cancer Neg Hx     Social History Social History   Tobacco Use  . Smoking status: Never Smoker  . Smokeless tobacco: Never Used  Vaping Use  . Vaping Use: Never used  Substance Use Topics  . Alcohol use: No  . Drug use: No     Allergies   Carbamazepine   Review of Systems Review of Systems  HENT: Positive for congestion.        Dark circles under eyes  All other systems reviewed and are negative.    Physical Exam Triage Vital Signs ED Triage Vitals [06/06/20 1120]  Enc Vitals Group     BP 120/85     Pulse Rate 89     Resp 20     Temp 97.9 F (36.6 C)     Temp Source Oral     SpO2 97 %      Weight      Height      Head Circumference      Peak Flow      Pain Score      Pain Loc      Pain Edu?      Excl. in GC?    No data found.  Updated Vital Signs BP 120/85 (BP Location: Left Arm)   Pulse 89   Temp 97.9 F (36.6 C) (Oral)   Resp 20   LMP 05/05/2020 (  Approximate)   SpO2 97%   Visual Acuity Right Eye Distance:   Left Eye Distance:   Bilateral Distance:    Right Eye Near:   Left Eye Near:    Bilateral Near:     Physical Exam Vitals reviewed.  Constitutional:      General: She is not in acute distress.    Appearance: She is not ill-appearing.  HENT:     Head: Normocephalic and atraumatic.     Right Ear: Hearing, tympanic membrane, ear canal and external ear normal. No drainage, swelling or tenderness. No middle ear effusion. There is no impacted cerumen. No mastoid tenderness. Tympanic membrane is not perforated, erythematous, retracted or bulging.     Left Ear: Hearing, tympanic membrane, ear canal and external ear normal. No drainage, swelling or tenderness.  No middle ear effusion. There is no impacted cerumen. No mastoid tenderness. Tympanic membrane is not perforated, erythematous, retracted or bulging.     Nose: Nose normal.     Right Sinus: No maxillary sinus tenderness or frontal sinus tenderness.     Left Sinus: No maxillary sinus tenderness or frontal sinus tenderness.     Mouth/Throat:     Mouth: Mucous membranes are moist.     Pharynx: Uvula midline. No oropharyngeal exudate, posterior oropharyngeal erythema or uvula swelling.     Tonsils: No tonsillar exudate.  Eyes:     General: Allergic shiner present.     Comments: Allergic shiners bilaterally   Cardiovascular:     Rate and Rhythm: Normal rate and regular rhythm.     Heart sounds: Normal heart sounds.  Pulmonary:     Effort: Pulmonary effort is normal.     Breath sounds: Normal breath sounds and air entry.  Abdominal:     General: Bowel sounds are normal.     Palpations: Abdomen is  soft.     Tenderness: There is no abdominal tenderness. There is no guarding or rebound. Negative signs include Murphy's sign and McBurney's sign.  Lymphadenopathy:     Cervical: No cervical adenopathy.  Psychiatric:        Behavior: Behavior is cooperative.      UC Treatments / Results  Labs (all labs ordered are listed, but only abnormal results are displayed) Labs Reviewed  RESP PANEL BY RT-PCR (FLU A&B, COVID) [id number]    EKG   Radiology No results found.  Procedures Procedures (including critical care time)  Medications Ordered in UC Medications - No data to display  Initial Impression / Assessment and Plan / UC Course  I have reviewed the triage vital signs and the nursing notes.  Pertinent labs & imaging results that were available during my care of the patient were reviewed by me and considered in my medical decision making (see chart for details).     Labs collected: covid and influenza. Isolation precautions until test result. patinet is fully vaccinated for covid-19. Rec trial of antihistamine like Zyrtec, Claritin, etc. Continue Flonase.  Return precautions- new/worsening symptoms including: fevers/chills, shortness of breath, chest pain, abd pain, etc. Caregiver verbalizes understanding and agreement. Continue to follow-up as scheduled with specialists next week.    Final Clinical Impressions(s) / UC Diagnoses   Final diagnoses:  Seasonal allergies     Discharge Instructions     For allergies, try an antihistamine. You can use Zyrtec, Claritin, Benedryl, or another antihistamine of your choice. Continue Flonase. Follow-up with your other specialists as scheduled.     ED Prescriptions    None  PDMP not reviewed this encounter.   Rhys Martini, PA-C 06/06/20 1308

## 2020-06-06 NOTE — ED Triage Notes (Signed)
Pt caregiver in with c/o pt having eye swelling on Friday, also c/o pt having chest congestion.  Pt has not had medication for sxs  Denies cough, runny nose

## 2020-06-06 NOTE — Discharge Instructions (Addendum)
For allergies, try an antihistamine. You can use Zyrtec, Claritin, Benedryl, or another antihistamine of your choice. Continue Flonase. Follow-up with your other specialists as scheduled.

## 2020-06-07 ENCOUNTER — Ambulatory Visit (INDEPENDENT_AMBULATORY_CARE_PROVIDER_SITE_OTHER): Payer: Medicare Other | Admitting: Family Medicine

## 2020-06-07 ENCOUNTER — Other Ambulatory Visit: Payer: Self-pay

## 2020-06-07 VITALS — BP 94/80 | HR 79

## 2020-06-07 DIAGNOSIS — Z8669 Personal history of other diseases of the nervous system and sense organs: Secondary | ICD-10-CM | POA: Diagnosis not present

## 2020-06-07 DIAGNOSIS — Z889 Allergy status to unspecified drugs, medicaments and biological substances status: Secondary | ICD-10-CM

## 2020-06-07 NOTE — Patient Instructions (Signed)
It was wonderful to see you today.  Please bring ALL of your medications with you to every visit.   Today we talked about:  Continue allergy medication and watching for any change or developing symptoms such as cough, congestion, fever.   Please call the clinic at [phone] if your symptoms worsen or you have any concerns. It was our pleasure to serve you.  Dr. Salvadore Dom

## 2020-06-07 NOTE — Progress Notes (Signed)
    SUBJECTIVE:   CHIEF COMPLAINT / HPI:   Michele [name] is a 34 yo F who presents with her mother/caregiver for the issue below.   Follow up  Allergies Seen in urgent care yesterday for 3 days of congestion. Tested for COVID and flu, tests are still pending. Mother initially concerned because patient was touching her chest. Patient is non verbal at baseline so she was concerned that patient was trying to communicate that something was wrong with her chest. She does not have cough, fever, SOB, or other sick symptoms. She is eating and drinking like normal. No decrease in urination.   Of note, last seizure was 1 week prior. Mother did not have to use Diastat. Has neurology appointment tomorrow. Denies seizure activity since the last.   PERTINENT  PMH / PSH: Hx of cerebral palsy, lyphedema (prior normal echo), Hx of COVID-19 (10 months prior)  OBJECTIVE:   BP 94/80   Pulse 79   SpO2 100%   General: Appears well, no acute distress. Appears younger than stated age. HEENT: Normocephalic. Unremarkable TM. No lymphadenopathy. No nasal flaring or rhinorrhea appreciated. Oropharynx not visualized due to lack of cooperation from patient  Cardiac: RRR, normal heart sounds, no murmurs Respiratory: CTAB, normal effort Extremities: Significant BLE.  Neuro: alert, moves upper body extremities with purpose Psych: normal affect  ASSESSMENT/PLAN:    H/O seasonal allergies Patient appears to be in usual state of health. Refilled certizine last week. Continued on flonase.  -F/u if symptoms return or new symptoms arise.   Hx of seizures -Continue current medications -Follow up with Neurology tomorrow   Lavonda Jumbo, DO [place] [facility]

## 2020-06-08 ENCOUNTER — Encounter (INDEPENDENT_AMBULATORY_CARE_PROVIDER_SITE_OTHER): Payer: Self-pay | Admitting: Family

## 2020-06-08 ENCOUNTER — Other Ambulatory Visit: Payer: Self-pay

## 2020-06-08 ENCOUNTER — Ambulatory Visit (INDEPENDENT_AMBULATORY_CARE_PROVIDER_SITE_OTHER): Payer: Medicare Other | Admitting: Family

## 2020-06-08 VITALS — BP 108/64 | HR 100 | Ht 59.0 in | Wt 222.6 lb

## 2020-06-08 DIAGNOSIS — G40319 Generalized idiopathic epilepsy and epileptic syndromes, intractable, without status epilepticus: Secondary | ICD-10-CM

## 2020-06-08 DIAGNOSIS — G40814 Lennox-Gastaut syndrome, intractable, without status epilepticus: Secondary | ICD-10-CM

## 2020-06-08 DIAGNOSIS — I890 Lymphedema, not elsewhere classified: Secondary | ICD-10-CM

## 2020-06-08 DIAGNOSIS — F73 Profound intellectual disabilities: Secondary | ICD-10-CM | POA: Diagnosis not present

## 2020-06-08 MED ORDER — DEPAKOTE SPRINKLES 125 MG PO CSDR
DELAYED_RELEASE_CAPSULE | ORAL | 5 refills | Status: DC
Start: 2020-06-08 — End: 2020-07-13

## 2020-06-08 MED ORDER — DIASTAT ACUDIAL 20 MG RE GEL: RECTAL | 5 refills | Status: DC

## 2020-06-08 MED ORDER — LEVOCARNITINE 1 GM/10ML PO SOLN: ORAL | 5 refills | Status: DC

## 2020-06-08 MED ORDER — TOPAMAX SPRINKLE 25 MG PO CPSP: ORAL_CAPSULE | ORAL | 5 refills | Status: DC

## 2020-06-08 MED ORDER — KEPPRA 100 MG/ML PO SOLN: ORAL | 5 refills | Status: DC

## 2020-06-08 MED ORDER — BANZEL 40 MG/ML PO SUSP
ORAL | 5 refills | Status: DC
Start: 2020-06-08 — End: 2020-11-03

## 2020-06-08 NOTE — Progress Notes (Signed)
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 She is taking and tolerating brand Depakote, Keppra, Topamax and Banzel for her seizure disorder. Michele [name] typically turns blue and requires blow by oxygen when seizures occur. She is completely dependent upon others for all activities of daily living.She is cared for at home by her mother.  Today's concerns: Mom reports today that Michele [name] has had one brief seizure since she was last seen. She is enrolled in a day program and enjoys the activities there. Michele [name] has had some congestion from seasonal allergies recently but has weathered that well. She has been otherwise generally healthy since she was last seen. Mom has no other health concerns for Michele [name] today other than previously mentioned.  Review of systems: Please see HPI for neurologic and other pertinent review of systems. Otherwise all other systems were reviewed and were negative.  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   Lymphedema 01/22/2017   Bilateral lower extremity edema 04/21/2016   Unsteadiness on feet    Health care maintenance  07/20/2014   Hypernatremia 06/02/2014   Amenorrhea 05/19/2014   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   Morbid obesity (HCC) 12/24/2012   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   URINARY INCONTINENCE 02/25/2010   Full incontinence of feces 02/25/2010   Hypothyroidism 01/29/2009   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     Family history: family history includes Cancer in her maternal aunt; Diabetes in an other family member; Heart disease in her maternal grandmother and another family member; Hypertension in her mother and paternal grandmother; Kidney disease in an other family member; Stroke in her maternal grandfather.   Social history: Social History   Socioeconomic History   Marital status: Single    Spouse name: Not on file   Number of children: 0   Years of education: Not on file   Highest education level: Not on file  Occupational History   Occupation: Disabled   Tobacco Use   Smoking status: Never  Smoker   Smokeless tobacco: Never Used  Vaping Use   Vaping Use: Never used  Substance and Sexual Activity   Alcohol use: No   Drug use: No   Sexual activity: Never  Other Topics Concern   Not on file  Social History Narrative   0 caffeine drinks    Lives with mother who helps ADLs.    Graduated from [employer] in 2010. Now attends day program at [employer].    Enjoys playing in water, ripping paper, watching TV, music and going out.   Social Determinants of Health   Financial Resource Strain: Not on file  Food Insecurity: Not on file  Transportation Needs: Not on file  Physical Activity: Not on file  Stress: Not on file  Social Connections: Not on file  Intimate Partner Violence: Not on file    Past/failed meds: Copied from previous record: Carbamazepine - rash  Allergies: Allergies  Allergen Reactions   Carbamazepine Rash     Immunizations: Immunization History  Administered Date(s) Administered   Influenza Split 04/14/2011, 03/22/2012   Influenza Whole 05/26/2009, 04/04/2010   Influenza,inj,Quad PF,6+ Mos 04/03/2013, 04/04/2014, 03/08/2015, 03/08/2016, 03/26/2017, 04/12/2018, 03/03/2019, 03/26/2020   PFIZER SARS-COV-2 Vaccination 11/10/2019, 12/01/2019, 05/28/2020   Tdap 01/14/2014     Diagnostics/Screenings:  Physical Exam: BP 108/64    Pulse 100    Ht 4\' 11"  (1.499 m)    Wt 222 lb 9.6 oz (101 kg)    BMI 44.96 kg/m   General: well developed, well nourished, obese woman, seated in wheelchair, in no evident distress; black hair, brown eyes, even handed Head: normocephalic and atraumatic. Oropharynx benign. No dysmorphic features. Neck: supple Cardiovascular: regular rate and rhythm, no murmurs.  Respiratory: clear to auscultation bilaterally Abdomen: bowel sounds present all four quadrants, abdomen soft, non-tender, non-distended.  Musculoskeletal: no skeletal deformities or obvious scoliosis. Has increased tone in her legs. Has prominent lymphedema in her legs. Skin: no rashes or neurocutaneous lesions  Neurologic Exam Mental Status: awake and fully alert. Has no language.  Smiles responsively. Claps her hands frequently, and takes my hands to clap together. Tolerant of invasions into her space Cranial Nerves: fundoscopic exam - red reflex  present.  Unable to fully visualize fundus.  Pupils equal briskly reactive to light.  Turns to localize faces and objects in the periphery. Turns to localize sounds in the periphery. Facial movements are symmetric, has lower facial weakness with drooling. Motor: normal bulk, tone and strength in the upper extremities, and increased tone in the lower extremities Sensory: withdrawal x 4 Coordination: unable to adequately assess due to patient's inability to participate in examination. No dysmetria when reaching for objects. Gait and Station: unable to stand and bear weight.  Reflexes: diminished and symmetric. Toes neutral. No clonus  Impression: 1. Lennox Gastaut encephalopathy 2. Generalized non-convulsive and convulsive epilepsy 3. Increased tone in the legs 4. Significant developmental and intellectual delays 5. Bilateral lymphedema  Recommendations for plan of care: The patient's previous [facility] records were reviewed. Michele [name] has neither had nor required imaging or lab studies since the last visit. She is a 34 year old woman with history of Lennox Gastaut encephalopathy, generalized convulsive and non-convulsive epilepsy, increased tone in the legs, significant developmental and intellectual delays; and bilateral lymphedema in her legs. She is taking and tolerating Depakote, Keppra, Topamax, and Banzel for her seizure disorder. She has occasional breakthrough seizures that are typically brief. Michele [name] is attending a day program and doing well in that setting. I asked Mom  to let me know if Michele [name]'s seizures become more frequent or more severe. I will otherwise see her back in follow up in 6 months or sooner if needed. Mom agreed with the plans made today.   The medication list was reviewed and reconciled. No changes were made in the prescribed medications today. A complete medication list was provided to the patient.  Allergies as of 06/08/2020      Reactions   Carbamazepine Rash      Medication  List       Accurate as of June 08, 2020 11:59 PM. If you have any questions, ask your nurse or doctor.        acetaminophen 650 MG suppository Commonly known as: TYLENOL Place 650 mg rectally every 8 (eight) hours as needed for mild pain.   acetaminophen 500 MG tablet Commonly known as: TYLENOL Take 500 mg by mouth every 6 (six) hours as needed for fever.   Banzel 40 MG/ML Susp Generic drug: Rufinamide Take 25ml in the morning and 29ml at night Started by: Elveria Rising, NP   Calcarb 600/D 600-400 MG-UNIT Tabs Generic drug: Calcium Carbonate w/Vitamin D Take 1 tablet by mouth daily. Only gives once a week.   cetirizine HCl 1 MG/ML solution Commonly known as: ZYRTEC Take 10 mg by mouth daily.   Depakote Sprinkles 125 MG capsule Generic drug: divalproex TAKE 8 CAPSULES BY MOUTH TWICE DAILY   Diastat AcuDial 20 MG Gel Generic drug: diazepam DIAL TO 15 MG, GIVE RECTALLY FOR SEIZURES LASTING 2 MINUTES OR LONGER   feeding supplement Liqd Take 237 mLs by mouth 3 (three) times daily between meals.   furosemide 20 MG tablet Commonly known as: LASIX TAKE 1 TABLET BY MOUTH EVERY DAY AS NEEDED FOR SWELLING   Keppra 100 MG/ML solution Generic drug: levETIRAcetam TAKE 12 MLS BY MOUTH EVERY MORNING AND 12 MLS AT NIGHT   lactulose 10 GM/15ML solution Commonly known as: CHRONULAC Take 30 mLs (20 g total) by mouth 2 (two) times daily. Take 30 ml twice daily   levOCARNitine 1 GM/10ML solution Commonly known as: CARNITOR TAKE 5 MLS BY MOUTH THREE TIMES DAILY   liver oil-zinc oxide 40 % ointment Commonly known as: DESITIN Apply 1 application topically daily.   nystatin powder Commonly known as: nystatin APPLY TOPICALLY TO AFFECTED DIAPER AREAS WITH EVERY DIAPER CHANGE UNTIL RASH CLEARS   pantoprazole sodium 40 mg/20 mL Pack Commonly known as: PROTONIX MIX 1 PACK AND TAKE DAILY AS DIRECTED   Topamax Sprinkle 25 MG capsule Generic drug: topiramate TAKE 6 CAPSULES BY  MOUTH EVERY MORNING AND 6 CAPSULES IN THE EVENING WITH FOOD       Total time spent with the patient was 20 minutes, of which 50% or more was spent in counseling and coordination of care.  Elveria Rising NP-C [facility] Ph. [phone] Fax [phone]

## 2020-06-09 ENCOUNTER — Telehealth: Payer: Self-pay | Admitting: Family Medicine

## 2020-06-09 ENCOUNTER — Other Ambulatory Visit: Payer: Self-pay | Admitting: Family Medicine

## 2020-06-09 DIAGNOSIS — I890 Lymphedema, not elsewhere classified: Secondary | ICD-10-CM

## 2020-06-09 NOTE — Telephone Encounter (Signed)
Called mom to  discuss referral. Tried to call number provided but line busy. Will try again later.

## 2020-06-09 NOTE — Telephone Encounter (Signed)
I have placed correct referral for lymphedema clinic. I have also contacted Jesselyn's mom to let her know. Thank you.

## 2020-06-09 NOTE — Telephone Encounter (Signed)
Mom stopped in stating daughter needs referral [facility] [phone]. Referral is for [facility].  Mom states person who referral needs to go to is [place]. Have Dr Allena Katz to call Mom on cell P#[phone]

## 2020-06-09 NOTE — Telephone Encounter (Signed)
Erroneous

## 2020-06-13 ENCOUNTER — Encounter (INDEPENDENT_AMBULATORY_CARE_PROVIDER_SITE_OTHER): Payer: Self-pay | Admitting: Family

## 2020-06-13 NOTE — Patient Instructions (Signed)
Thank you for coming in today.   Instructions for you until your next appointment are as follows: 1. Continue giving Michele [name]'s medications as prescribed.  2. Let me know if her seizures become more frequent or more severe 3. Please plan to return for follow up in 6 months or sooner if needed.

## 2020-06-15 ENCOUNTER — Ambulatory Visit (INDEPENDENT_AMBULATORY_CARE_PROVIDER_SITE_OTHER): Payer: Medicare Other | Admitting: Family

## 2020-06-23 ENCOUNTER — Other Ambulatory Visit: Payer: Self-pay

## 2020-06-23 ENCOUNTER — Ambulatory Visit: Payer: Medicare Other | Attending: Family Medicine | Admitting: Occupational Therapy

## 2020-06-23 ENCOUNTER — Encounter: Payer: Self-pay | Admitting: Occupational Therapy

## 2020-06-23 DIAGNOSIS — I890 Lymphedema, not elsewhere classified: Secondary | ICD-10-CM | POA: Insufficient documentation

## 2020-06-23 NOTE — Patient Instructions (Signed)

## 2020-06-23 NOTE — Therapy (Signed)
[place] MAIN [facility] SERVICES [place], [place], [address] Phone: [phone]   Fax:  [phone]  Occupational Therapy Evaluation: Lymphedema Care  Patient Details  Name: Michele [name] MRN: [id number] Date of Birth: [birth year] Referring Provider (OT): Zenia Resides, MD   Encounter Date: 06/23/2020   OT End of Session - 06/23/20 0926    Visit Number 1    Number of Visits 6    Date for OT Re-Evaluation 09/21/20    OT Start Time 0900    OT Stop Time 1005    OT Time Calculation (min) 65 min    Activity Tolerance Patient tolerated treatment well;No increased pain           Past Medical History:  Diagnosis Date  . Cerebral palsy ([place])   . Hypothyroidism   . Lennox-Gastaut syndrome ([place])   . Osteoporosis   . Pneumonia   . Profound mental retardation   . Reflux   . Rickets   . Seizures ([place])     Past Surgical History:  Procedure Laterality Date  . FOOT MASS EXCISION Right   . Gastrocutaneous fistual repair  2012  . PEG TUBE PLACEMENT    . PEG TUBE REMOVAL  2012 ?    There were no vitals filed for this visit.   Subjective Assessment - 06/23/20 8938    Subjective  Michele [name] is referred to Occupational Therapy for evaluation and treatment of BLE lymphedema by Zenia Resides, MD. Kenzey is accompanied by her mother and primary caregiver, who states the purpose of their visit is to obtain new compression garments. Michele [name] saw a lymphedema therapist in June in [place] and was fitted with new garments at that time. Her mother tells me that these garments are too tight at the bese of toes and ankles, causing redness and skin irritation. Upon assessment of replacement garments fitted in June, they provide same ccl 3 flat knit compression, but differ infabric selection. Custom compression stockings provided in the past by this therapist are Jobst Elvarex SOFT fabric. Replacements are made of a courser, denser material called  Elvarex Classic.    Patient is accompanied by: Family member   Mother and primary CG   Pertinent History BLE lymphedema, profound developmental delay , CP, Lennox-Gastaut syndrome, seisure disorder, hypothyroidism, osteoporosis, rickets, non-verbal    Limitations paraplegia, impaired balance, postural impairment,developmental and intellectual delays,increased muscle tone BLE, hx seizures, incontinence. completely dependent for all ADLs    Repetition Increases Symptoms    Special Tests +Stemmer bilaterally    Patient Stated Goals replace existing compression garments fitted in June because they irritate skin    Currently in Pain? No/denies             [facility] OT Assessment - 06/23/20 0001      Assessment   Medical Diagnosis mild, stage II, BLE Lymphedema    Referring Provider (OT) Zenia Resides, MD    Onset Date/Surgical Date --   childhood   Prior Therapy Intensive and management Phase CDT, custom  flat knit compression knee highs      Precautions   Precautions --   LE skin precautions in keeping w cellulitis precautions     Home  Environment   Family/patient expects to be discharged to: Private residence    Living Arrangements Parent    Type of Home House      Prior Function   Level of Independence Needs assistance with transfers;Needs assistance with gait;Needs  assistance with ADLs    Vocation On disability      ADL   Eating/Feeding + 1  Total assistance    Grooming + 1 Total assistance    Upper Body Bathing + 1 Total asssestance    Lower Body Bathing + 1 Total assistance    Upper Body Dressing + 1 Total assistance    Lower Body Dressing +1 Total aassistance    Toilet Transfer Other (comment)   dependent   Tub/Shower Transfer Other (comment)   dependent     IADL   Prior Level of Function Shopping D    Shopping Completely unable to shop    Prior Level of Function Light Housekeeping D    Light Housekeeping Does not participate in any housekeeping tasks    Prior Level  of Function Meal Prep D    Meal Prep Needs to have meals prepared and served    Prior Level of Function Community Mobility D    Community Mobility Does not travel at all      Mobility   Mobility Status History of falls;Needs assist      Cognition   Overall Cognitive Status History of cognitive impairments - at baseline      Observation/Other Assessments   Observations BLE appears very well managed. Skin is well hydrated, soft, easily mobilized    Focus on Therapeutic Outcomes (FOTO)  Initial FOTO: 30/100    Outcome Measures no volumetrics performed due to time constraints      Posture/Postural Control   Posture/Postural Control Postural limitations      Sensation   Light Touch Not tested                    OT Treatments/Exercises (OP) - 06/23/20 0001      Transfers   Transfers Sit to Stand;Stand to Sit    Sit to Stand From chair/3-in-1;With armrests;With upper extremity assist;2: Max assist    Stand to Sit 2: Max assist;With upper extremity assist;With armrests;To bed;Uncontrolled descent      ADLs   Overall ADLs dependent      Manual Therapy   Manual Therapy Edema management    Edema Management completed anatomical measurements for BLE replacements for custom, ccl 3, flat knit, Elvarex SOFT knee highs and Jobst RELAX devices for HOS. Faxed to DME vendor                 OT Education - 06/23/20 [phone]    Education Details Reviewed custom compression medicaid replacement rules. Reviewed measurement and DME process. Reviewed fitting and assessment  processes    Person(s) Educated Parent(s)    Methods Explanation;Demonstration;Handout    Comprehension Verbalized understanding;Returned demonstration               OT Long Term Goals - 06/23/20 1631      OT LONG TERM GOAL #1   Title Pt to follow Lymphedema precautions and prevention strategies with maximum caregiver assistance to limit LE progression and further functional decline.    Baseline  Dependent    Time 12    Period Weeks    Status New    Target Date 09/21/20      OT LONG TERM GOAL #2   Title Lymphedema (LE) management/ self-care: Pt able to apply knee length, replacement, custom, flat knit compression knee highs using assistive devices and max CG assistance  with max caregiver assist x 1 to consistent garment wear essential for optimal LE management over time.  Baseline Dependent      Time 12    Period Weeks    Status New    Target Date 09/21/20      OT LONG TERM GOAL #4   Title Lymphedema (LE) management/ self-care:  Pt to tolerate daily compression wraps, compression garments and/ or HOS devices in keeping w/ prescribed wear regimes within 1 week of issue date of each to progress and retain clinical and functional gains and to limit LE progression.    Baseline dependent    Time 12    Period Weeks    Status New      OT LONG TERM GOAL #5   Title Lymphedema (LE) management/ self-care:  During Management Phase CDT Pt to sustain current limb volumes within 3%, and all other clinical gains achieved during OT treatment iwith maximum caregiver assistance  (to control limb swelling and associated pain/ discomfort, to  limit LE progression, to decrease infection risk and to limit further functional decline.    Baseline dependent    Time 6    Period Months    Status On-going    Target Date 12/20/20                 Plan - 06/23/20 1613    Clinical Impression Statement Ynez Eugenio is a 35 yo female presenting with mild, stage II, BLE lymphedema of unknown etiology. Pt's mother is managing lymphedema very well with self care home program  skills learned during prior OT course of [facility] [employer] therapy. In comparing most recent custom compression garments (Knee length, custom,flat knit ccl 3 (34-46 mmHg) Jobst, Elvarex CLASSIC) fitted by LE therapist in [place], these garments are constructed of stiffer, courser fabric than compression garments previously fitted by  this OT (Jobst, custom , flat knit ccl 3, Elvarex SOFT knee highs). Courser fabricof new replacements are tight and cause redness around base of toes. These garments also slide down from top edge resulting in bunching at ankles , resulting in mild, reddened skin abrasions and discomfort. This OT completed repeat anatomical measurements for softer replacement garments and submitted the order to the DME vendor. That vendor informed me later today that Pt had already obtained full quota of garments reimbursd by Medicaid this year. I have not yet heard back from DME vendor when Pt's benefit year starts over, but our plan is to hold order until Pt becomes eligible for new replacements. In the meantime I advoised her mother to [employer] custom garments in best condition and avoid course garments ccausing skin problems.We'll see Ajanay back to fit new custom garment replacements ASAP.    OT Occupational Profile and History Comprehensive Assessment- Review of records and extensive additional review of physical, cognitive, psychosocial history related to current functional performance    Occupational performance deficits (Please refer to evaluation for details): ADL's;IADL's;Rest and Sleep;Work;Leisure;Play;Social Participation    Body Structure / Function / Physical Skills ADL;Decreased knowledge of use of DME;Gait;Obesity;Strength;Balance;Dexterity;Pain;Tone;Edema;UE functional use;ROM;IADL;Endurance;Continence;Coordination;Flexibility;Mobility;Sensation;Skin integrity;Decreased knowledge of precautions    Rehab Potential Good    Clinical Decision Making Multiple treatment options, significant modification of task necessary    Comorbidities Affecting Occupational Performance: May have comorbidities impacting occupational performance    Modification or Assistance to Complete Evaluation  Max significant modification of tasks or assist is necessary to complete    OT Frequency Other (comment)   Retrn for custom  compression garment fitting ASAP. This may require multiple visits if garments need adjustment and remakes. Estimate 3 return visits to cover  possible remeasurments and refitting   OT Treatment/Interventions Self-care/ADL training;DME and/or AE instruction;Therapeutic activities   anatomical measurement, specifications,and  fitting for custom compression garments and devices   Plan fit replacements for custom daytime compression garments, including BLE, custom, knee length, Jobst, Elvarex SOFT, ccl 3 ( 34-46 mmHg) ; open toe, t heel, 2.5 SB on tope, oblique top edge    Consulted and Agree with Plan of Care Family member/caregiver           Patient will benefit from skilled therapeutic intervention in order to improve the following deficits and impairments:   Body Structure / Function / Physical Skills: ADL,Decreased knowledge of use of DME,Gait,Obesity,Strength,Balance,Dexterity,Pain,Tone,Edema,UE functional use,ROM,IADL,Endurance,Continence,Coordination,Flexibility,Mobility,Sensation,Skin integrity,Decreased knowledge of precautions       Visit Diagnosis: Lymphedema, not elsewhere classified - Plan: Ot plan of care cert/re-cert    Problem List Patient Active Problem List   Diagnosis Date Noted  . Cervical cancer screening 03/26/2020  . Bruise 02/15/2020  . COVID-19   . AKI (acute kidney injury) (HCC) 08/03/2019  . Acute hypoxemic respiratory failure due to COVID-19 (HCC) 08/03/2019  . Pneumonia due to COVID-19 virus 08/01/2019  . Anasarca 07/09/2019  . Allergies 06/02/2019  . Upper respiratory symptom 06/02/2019  . Decreased urination 01/10/2019  . Lennox-Gastaut syndrome (HCC) 07/06/2017  . Lymphedema 01/22/2017  . Bilateral lower extremity edema 04/21/2016  . Unsteadiness on feet   . Health care maintenance 07/20/2014  . Hypernatremia 06/02/2014  . Amenorrhea 05/19/2014  . 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 Morbid obesity (HCC)  12/24/2012  . 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 URINARY INCONTINENCE 02/25/2010  . Full incontinence of feces 02/25/2010  . Hypothyroidism 01/29/2009  . 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 Cerebral palsy (HCC) 01/29/2009  . GASTROESOPHAGEAL REFLUX DISEASE 01/29/2009  . Osteoporosis 01/29/2009  . Seizure disorder (HCC) 01/29/2009    Loel Dubonnet, MS, OTR/L, [facility] 06/23/20 4:38 PM  [facility] MAIN [facility] SERVICES [address] [place], [place], [address] Phone: [phone]   Fax:  [phone]  Name: GIMENA BUICK MRN: [id number] Date of Birth: 1986/05/21

## 2020-06-26 NOTE — Progress Notes (Addendum)
     SUBJECTIVE:   CHIEF COMPLAINT / HPI:   Michele [name] is a 35 y.o. female presents for follow up for lymphedema  Lymphedema Mom reports patient is doing well with her lymphedema.  She wrapped her legs every day at night and during the day she wears stockings.  She recently started back to physical therapy however will not go back till June later this year due to COVID.  Takes Lasix 20mg  PRN.  Mom has been concerned about green spots appearing on the legs.  Mom would like refill on Protonix  PERTINENT  PMH / PSH: Lymphedema, Lennox Gestalt syndrome, quadriplegia, cerebral palsy, hypothyroidism  OBJECTIVE:   BP 105/65   Pulse 88   Ht 4\' 11"  (1.499 m)   LMP 06/07/2020 (Approximate)   SpO2 95%   BMI 44.96 kg/m    General: Alert, no acute distress, pleasant, wheelchair bound Cardio: Normal S1 and S2, RRR, no r/m/g Pulm: CTAB, normal work of breathing Abdomen: Bowel sounds normal. Abdomen soft and non-tender.  Extremities: Lymphedema present bilaterally. superficial veins visible. Compression stockings present. Neuro: Cranial nerves grossly intact   ASSESSMENT/PLAN:   Lymphedema Lymphedema continues to be stable.  Continue compression stockings, elevation of lower limbs and Lasix as needed.  Reassured mom that green spots on her legs look like superficial veins.  Recommended that she keep an eye on it and if she notices more than she should come in for follow-up.  Encounter for hepatitis C screening test for low risk patient Obtained hepatitis c antibody today.     , MD PGY-2 [facility]

## 2020-06-30 ENCOUNTER — Encounter: Payer: Self-pay | Admitting: Family Medicine

## 2020-06-30 ENCOUNTER — Other Ambulatory Visit: Payer: Self-pay

## 2020-06-30 ENCOUNTER — Encounter: Payer: Medicare Other | Admitting: Occupational Therapy

## 2020-06-30 ENCOUNTER — Ambulatory Visit (INDEPENDENT_AMBULATORY_CARE_PROVIDER_SITE_OTHER): Payer: Medicare Other | Admitting: Family Medicine

## 2020-06-30 VITALS — BP 105/65 | HR 88 | Ht 59.0 in

## 2020-06-30 DIAGNOSIS — K219 Gastro-esophageal reflux disease without esophagitis: Secondary | ICD-10-CM | POA: Diagnosis not present

## 2020-06-30 DIAGNOSIS — Z1159 Encounter for screening for other viral diseases: Secondary | ICD-10-CM | POA: Diagnosis not present

## 2020-06-30 DIAGNOSIS — I890 Lymphedema, not elsewhere classified: Secondary | ICD-10-CM

## 2020-06-30 MED ORDER — PANTOPRAZOLE SODIUM 40 MG PO PACK: PACK | ORAL | 3 refills | Status: DC

## 2020-06-30 NOTE — Patient Instructions (Signed)
Thank you for coming to see me today. It was a pleasure. You are doing great, keep up the great work!  Please follow-up as and when you need.  If you have any questions or concerns, please do not hesitate to call the office at [phone].  If you develop fevers>100.5, shortness of breath, chest pain, palpitations, dizziness, abdominal pain, nausea, vomiting, diarrhea or cannot eat or drink then please go to the ER immediately.  Best wishes,   Dr Allena Katz

## 2020-06-30 NOTE — Assessment & Plan Note (Signed)
Obtained hepatitis c antibody today.

## 2020-06-30 NOTE — Assessment & Plan Note (Signed)
Lymphedema continues to be stable.  Continue compression stockings, elevation of lower limbs and Lasix as needed.  Reassured mom that green spots on her legs look like superficial veins.  Recommended that she keep an eye on it and if she notices more than she should come in for follow-up.

## 2020-07-01 LAB — HEPATITIS C ANTIBODY: Hep C Virus Ab: <0.1 s/co ratio (ref 0.0–0.9)

## 2020-07-08 ENCOUNTER — Encounter: Payer: Medicare Other | Admitting: Occupational Therapy

## 2020-07-09 ENCOUNTER — Telehealth: Payer: Self-pay

## 2020-07-09 NOTE — Telephone Encounter (Signed)
Patients mother calls nurse line stating Pantoprazole needs a prior auth.  Received fax from pharmacy, PA needed on Pantoprazole. Clinical questions submitted via Cover My Meds. Waiting on response, could take up to 72 hours.  Cover My Meds info: Key: [id number]

## 2020-07-13 ENCOUNTER — Other Ambulatory Visit (INDEPENDENT_AMBULATORY_CARE_PROVIDER_SITE_OTHER): Payer: Self-pay

## 2020-07-13 DIAGNOSIS — G40319 Generalized idiopathic epilepsy and epileptic syndromes, intractable, without status epilepticus: Secondary | ICD-10-CM

## 2020-07-13 MED ORDER — DEPAKOTE SPRINKLES 125 MG PO CSDR: DELAYED_RELEASE_CAPSULE | ORAL | 5 refills | Status: DC

## 2020-07-13 NOTE — Telephone Encounter (Signed)
Medication approved and the pharmacy has been updated. Per pharmacist, they will have to order the medication, the order will be placed today.

## 2020-07-13 NOTE — Telephone Encounter (Signed)
Please send to the pharmacy °

## 2020-07-14 ENCOUNTER — Encounter: Payer: Medicare Other | Admitting: Occupational Therapy

## 2020-07-26 ENCOUNTER — Other Ambulatory Visit: Payer: Self-pay | Admitting: Family Medicine

## 2020-07-31 ENCOUNTER — Other Ambulatory Visit: Payer: Self-pay | Admitting: Family Medicine

## 2020-08-04 ENCOUNTER — Other Ambulatory Visit: Payer: Self-pay | Admitting: Family Medicine

## 2020-08-04 DIAGNOSIS — R600 Localized edema: Secondary | ICD-10-CM

## 2020-09-06 ENCOUNTER — Other Ambulatory Visit: Payer: Self-pay | Admitting: Family Medicine

## 2020-09-16 ENCOUNTER — Encounter (HOSPITAL_COMMUNITY): Payer: Self-pay | Admitting: Emergency Medicine

## 2020-09-16 ENCOUNTER — Emergency Department (HOSPITAL_COMMUNITY)
Admission: EM | Admit: 2020-09-16 | Discharge: 2020-09-17 | Disposition: A | Discharge status: Home / Self Care | Payer: Medicare Other | Attending: Emergency Medicine | Admitting: Emergency Medicine

## 2020-09-16 ENCOUNTER — Other Ambulatory Visit: Payer: Self-pay

## 2020-09-16 ENCOUNTER — Emergency Department (HOSPITAL_COMMUNITY): Payer: Medicare Other

## 2020-09-16 DIAGNOSIS — Z20822 Contact with and (suspected) exposure to covid-19: Secondary | ICD-10-CM | POA: Diagnosis not present

## 2020-09-16 DIAGNOSIS — R509 Fever, unspecified: Secondary | ICD-10-CM | POA: Diagnosis not present

## 2020-09-16 DIAGNOSIS — J80 Acute respiratory distress syndrome: Secondary | ICD-10-CM | POA: Diagnosis not present

## 2020-09-16 DIAGNOSIS — J069 Acute upper respiratory infection, unspecified: Secondary | ICD-10-CM

## 2020-09-16 DIAGNOSIS — J811 Chronic pulmonary edema: Secondary | ICD-10-CM | POA: Diagnosis not present

## 2020-09-16 DIAGNOSIS — R067 Sneezing: Secondary | ICD-10-CM | POA: Insufficient documentation

## 2020-09-16 DIAGNOSIS — I517 Cardiomegaly: Secondary | ICD-10-CM | POA: Diagnosis not present

## 2020-09-16 DIAGNOSIS — E039 Hypothyroidism, unspecified: Secondary | ICD-10-CM | POA: Insufficient documentation

## 2020-09-16 DIAGNOSIS — R0981 Nasal congestion: Secondary | ICD-10-CM | POA: Insufficient documentation

## 2020-09-16 DIAGNOSIS — J3489 Other specified disorders of nose and nasal sinuses: Secondary | ICD-10-CM | POA: Diagnosis not present

## 2020-09-16 DIAGNOSIS — R059 Cough, unspecified: Secondary | ICD-10-CM | POA: Insufficient documentation

## 2020-09-16 DIAGNOSIS — Z8616 Personal history of COVID-19: Secondary | ICD-10-CM | POA: Diagnosis not present

## 2020-09-16 DIAGNOSIS — B9789 Other viral agents as the cause of diseases classified elsewhere: Secondary | ICD-10-CM | POA: Diagnosis not present

## 2020-09-16 NOTE — ED Triage Notes (Signed)
Pt arrives with caregiver who reports cough and fever today. Also reports pt has had a decreased appetite and fluid build up on her legs. Denies nausea/vomiting/diarrhea. NAD noted at this time.

## 2020-09-17 DIAGNOSIS — R059 Cough, unspecified: Secondary | ICD-10-CM | POA: Diagnosis not present

## 2020-09-17 DIAGNOSIS — R0981 Nasal congestion: Secondary | ICD-10-CM | POA: Diagnosis not present

## 2020-09-17 DIAGNOSIS — B9789 Other viral agents as the cause of diseases classified elsewhere: Secondary | ICD-10-CM | POA: Diagnosis not present

## 2020-09-17 LAB — RESP PANEL BY RT-PCR (FLU A&B, COVID) ARPGX2
Influenza A by PCR: NEGATIVE
Influenza B by PCR: NEGATIVE
SARS Coronavirus 2 by RT PCR: NEGATIVE

## 2020-09-17 NOTE — ED Provider Notes (Signed)
[facility] EMERGENCY DEPARTMENT Provider Note   CSN: [id number] Arrival date & time: 09/16/20  2057     History Chief Complaint  Patient presents with  . Cough    Michele [name] is a 35 y.o. female.  Patient with nasal congestion, occasional non prod cough, occasional sneezing, and t 99 at home. Parent/caregiver indicates heard there a 'viruses' going around, so wanted to get her checked. No specific known ill contacts. Is vaccinated. Has been breathing comfortably (room air pulse ox in ED 96-100%). Pt with hx environmental/seasonal allergies. Caregiver indicates also mild bilateral foot/ankle swelling, which is not unusual for patient - states uses compression stockings and prn lasix for same. Normal urine output. Is eating/drinking. No vomiting. Mental status/alertness at baseline. No recent seizures.   The history is provided by the patient, a parent and a caregiver. The history is limited by the condition of the patient.  Cough Associated symptoms: rhinorrhea        Past Medical History:  Diagnosis Date  . Cerebral palsy (HCC)   . Hypothyroidism   . Lennox-Gastaut syndrome (HCC)   . Osteoporosis   . Pneumonia   . Profound mental retardation   . Reflux   . Rickets   . Seizures [facility])     Patient Active Problem List   Diagnosis Date Noted  . Cervical cancer screening 03/26/2020  . Bruise 02/15/2020  . COVID-19   . AKI (acute kidney injury) (HCC) 08/03/2019  . Acute hypoxemic respiratory failure due to COVID-19 (HCC) 08/03/2019  . Pneumonia due to COVID-19 virus 08/01/2019  . Anasarca 07/09/2019  . Allergies 06/02/2019  . Upper respiratory symptom 06/02/2019  . Decreased urination 01/10/2019  . Lennox-Gastaut syndrome (HCC) 07/06/2017  . Lymphedema 01/22/2017  . Bilateral lower extremity edema 04/21/2016  . Unsteadiness on feet   . Encounter for hepatitis C screening test for low risk patient 07/20/2014  . Hypernatremia 06/02/2014  . Amenorrhea  05/19/2014  . 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 Morbid obesity (HCC) 12/24/2012  . 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 URINARY INCONTINENCE 02/25/2010  . Full incontinence of feces 02/25/2010  . Hypothyroidism 01/29/2009  . 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 Cerebral palsy (HCC) 01/29/2009  . GASTROESOPHAGEAL REFLUX DISEASE 01/29/2009  . Osteoporosis 01/29/2009  . Seizure disorder (HCC) 01/29/2009    Past Surgical History:  Procedure Laterality Date  . FOOT MASS EXCISION Right   . Gastrocutaneous fistual repair  2012  . PEG TUBE PLACEMENT    . PEG TUBE REMOVAL  2012 ?     OB History   No obstetric history on file.     Family History  Problem Relation Age of Onset  . Hypertension Mother   . Heart disease Maternal Grandmother        Died at 66  . Stroke Maternal Grandfather        died at 42  . Hypertension Paternal Grandmother   . Diabetes Other        MGGM  . Heart disease Other        MGGF  . Kidney disease Other        Maternal Great Aunt   . Cancer Maternal Aunt        lung  . Colon cancer Neg Hx     Social History   Tobacco Use  . Smoking status: Never Smoker  . Smokeless tobacco: Never Used  Vaping Use  . Vaping Use: Never used  Substance Use Topics  . Alcohol use: No  . Drug use: No    Home Medications Prior to Admission medications   Medication Sig Start Date End Date Taking? Authorizing Provider  fluticasone (FLONASE) 50 MCG/ACT nasal spray PLACE 2 SPRAYS INTO BOTH NOSTRILS TWICE DAILY 08/02/20   Towanda OctavePatel, Poonam, MD  acetaminophen (TYLENOL) 500 MG tablet Take 500 mg by mouth every 6 (six) hours as needed for fever.    [provider]  acetaminophen (TYLENOL) 650 MG suppository Place 650 mg rectally every 8 (eight) hours as needed for mild pain.    [provider]  Mariane DuvalBANZEL 40 MG/ML SUSP Take 5ml in the morning and 5ml at night 06/08/20   Elveria RisingGoodpasture, [name], NP   Calcium Carbonate-Vitamin D (CALCARB 600/D) 600-400 MG-UNIT tablet Take 1 tablet by mouth daily. Only gives once a week.    [provider]  cetirizine HCl (ZYRTEC) 1 MG/ML solution Take 10 mg by mouth daily. 05/31/20   [provider]  DEPAKOTE SPRINKLES 125 MG capsule TAKE 8 CAPSULES BY MOUTH TWICE DAILY 07/13/20   Elveria RisingGoodpasture, [name], NP  DIASTAT ACUDIAL 20 MG GEL DIAL TO 15 MG, GIVE RECTALLY FOR SEIZURES LASTING 2 MINUTES OR LONGER 06/08/20   Elveria RisingGoodpasture, [name], NP  feeding supplement, ENSURE ENLIVE, (ENSURE ENLIVE) LIQD Take 237 mLs by mouth 3 (three) times daily between meals. 05/30/19   Towanda OctavePatel, Poonam, MD  furosemide (LASIX) 20 MG tablet TAKE 1 TABLET BY MOUTH EVERY DAY AS NEEDED FOR SWELLING 08/04/20   Towanda OctavePatel, Poonam, MD  KEPPRA 100 MG/ML solution TAKE 12 MLS BY MOUTH EVERY MORNING AND 12 MLS AT NIGHT 06/08/20   Elveria RisingGoodpasture, [name], NP  lactulose (CHRONULAC) 10 GM/15ML solution Take 30 mLs (20 g total) by mouth 2 (two) times daily. Take 30 ml twice daily 04/28/20 10/25/20  Towanda OctavePatel, Poonam, MD  levOCARNitine (CARNITOR) 1 GM/10ML solution TAKE 5 MLS BY MOUTH THREE TIMES DAILY 06/08/20   Elveria RisingGoodpasture, [name], NP  liver oil-zinc oxide (DESITIN) 40 % ointment Apply 1 application topically daily.    [provider]  nystatin (NYSTATIN) powder APPLY TOPICALLY TO THE AFFECTED AREA WITH EACH DIAPER CHANGE UNTIL RASH IS GONE 09/06/20   Towanda OctavePatel, Poonam, MD  pantoprazole sodium (PROTONIX) 40 mg/20 mL PACK MIX 1 PACK AND TAKE DAILY AS DIRECTED 06/30/20   Towanda OctavePatel, Poonam, MD  TOPAMAX SPRINKLE 25 MG capsule TAKE 6 CAPSULES BY MOUTH EVERY MORNING AND 6 CAPSULES IN THE EVENING WITH FOOD 06/08/20   Elveria RisingGoodpasture, [name], NP    Allergies    Carbamazepine  Review of Systems   Review of Systems  Unable to perform ROS: Patient nonverbal  HENT: Positive for congestion, rhinorrhea and sneezing.   Respiratory: Positive for cough.   Gastrointestinal: Negative for vomiting.  pt not verbally responsive -  level 5 caveat.   tmax 99.6 at home.    Physical Exam Updated Vital Signs BP (!) 110/54   Pulse 100   Temp 98.9 F (37.2 C) (Axillary)   Resp 20   SpO2 100%   Physical Exam Vitals and nursing note reviewed.  Constitutional:      Appearance: Normal appearance. She is well-developed.  HENT:     Head: Atraumatic.     Right Ear: Tympanic membrane normal.     Left Ear: Tympanic membrane normal.     Nose: Rhinorrhea present.     Mouth/Throat:     Mouth: Mucous membranes are moist.     Pharynx: Oropharynx is clear.  Eyes:  General: No scleral icterus.    Conjunctiva/sclera: Conjunctivae normal.     Pupils: Pupils are equal, round, and reactive to light.  Neck:     Trachea: No tracheal deviation.     Comments: No stiffness or rigidity.  Cardiovascular:     Rate and Rhythm: Normal rate and regular rhythm.     Pulses: Normal pulses.     Heart sounds: Normal heart sounds. No murmur heard. No friction rub. No gallop.   Pulmonary:     Effort: Pulmonary effort is normal. No respiratory distress.     Breath sounds: Normal breath sounds. No stridor. No wheezing or rales.  Abdominal:     General: Bowel sounds are normal. There is no distension.     Palpations: Abdomen is soft.     Tenderness: There is no abdominal tenderness.  Genitourinary:    Comments: No cva tenderness.  Musculoskeletal:     Cervical back: Normal range of motion and neck supple. No rigidity. No muscular tenderness.     Comments: Mild bilateral foot/ankle swelling.   Skin:    General: Skin is warm and dry.     Findings: No rash.  Neurological:     Mental Status: She is alert.     Comments: Alert, content, smiling. Mental status and functional ability reported as c/w baseline.   Psychiatric:        Mood and Affect: Mood normal.     ED Results / Procedures / Treatments   Labs (all labs ordered are listed, but only abnormal results are displayed) Labs Reviewed  RESP PANEL BY RT-PCR (FLU A&B, COVID)  ARPGX2    EKG None  Radiology DG Chest 2 View  Result Date: 09/16/2020 CLINICAL DATA:  Cough, fever, decreased appetite fluid buildup in the legs EXAM: CHEST - 2 VIEW COMPARISON:  Radiograph 08/03/2019, CT 04/05/2016 FINDINGS: Low lung volumes. Lungs appear significantly cleared from prior examination though some mild pulmonary vascular congestion remains. Some hazy opacity towards the mid to lower lungs, could reflect early edema or atelectasis. Prominent cardiomediastinal contour compatible with cardiomegaly seen on comparison priors. Exaggerated thoracic kyphosis. No other acute osseous or soft tissue abnormality. IMPRESSION: Cardiomegaly and mild pulmonary vascular congestion with hazy opacity towards the mid to lower lungs, which could reflect early edema or atelectasis. Electronically Signed   By: Kreg Shropshire M.D.   On: 09/16/2020 21:45    Procedures Procedures   Medications Ordered in ED Medications - No data to display  ED Course  I have reviewed the triage vital signs and the nursing notes.  Pertinent labs & imaging results that were available during my care of the patient were reviewed by me and considered in my medical decision making (see chart for details).    MDM Rules/Calculators/A&P                         CXR. Labs.  Reviewed nursing notes and prior charts for additional history.   CXR reviewed/interpreted by me - no pna. ?mild vascular congestion.   COVID and flu swab remain pending.   Michele [name] was evaluated in Emergency Department on 09/17/2020 for the symptoms described in the history of present illness. She was evaluated in the context of the global COVID-19 pandemic, which necessitated consideration that the patient might be at risk for infection with the SARS-CoV-2 virus that causes COVID-19. Institutional protocols and algorithms that pertain to the evaluation of patients at risk for COVID-19 are in  a state of rapid change based on information released by  regulatory bodies including the CDC and federal and state organizations. These policies and algorithms were followed during the patient's care in the ED.  Pt currently breathing comfortably, in no apparent pain, no resp distress or increased wob. Pt appears stable for d/c. Suspect either viral uri vs seasonal allergies.   Rec pcp f/u.  Return precautions provided.    Final Clinical Impression(s) / ED Diagnoses Final diagnoses:  None    Rx / DC Orders ED Discharge Orders    None       Cathren Laine, MD 09/17/20 [phone]

## 2020-09-17 NOTE — Discharge Instructions (Addendum)
It was our pleasure to provide your ER care today - we hope that you feel better.  May try claritin-d or zyrtec-d as need if seasonal allergies/runny nose/congestion (available over the counter).  You may give your lasix as need, elevate feet, compression stockings, limit extra salt intake.   Your covid and flu tests are pending and should be resulted by tomorrow AM - you may check MyChart or call for results.   Follow up with primary care doctor in the next 1-2 weeks.  Return to ER if worse, new symptoms, high fevers, increased trouble breathing, or other concern.

## 2020-09-17 NOTE — ED Notes (Signed)
DC instructions reviewed with pt. Pt verbalized instructions.  Pt DC. 

## 2020-09-27 ENCOUNTER — Other Ambulatory Visit: Payer: Self-pay

## 2020-09-27 ENCOUNTER — Ambulatory Visit (INDEPENDENT_AMBULATORY_CARE_PROVIDER_SITE_OTHER): Payer: Medicare Other | Admitting: Family Medicine

## 2020-09-27 DIAGNOSIS — J302 Other seasonal allergic rhinitis: Secondary | ICD-10-CM

## 2020-09-27 MED ORDER — CETIRIZINE HCL 1 MG/ML PO SOLN: 10.0000 mg | Freq: Every day | ORAL | 6 refills | Status: DC

## 2020-09-27 MED ORDER — KETOTIFEN FUMARATE 0.025 % OP SOLN
1.0000 [drp] | Freq: Two times a day (BID) | OPHTHALMIC | 0 refills | Status: AC
Start: 2020-09-27 — End: 2020-10-27

## 2020-09-27 NOTE — Progress Notes (Signed)
     SUBJECTIVE:   CHIEF COMPLAINT / HPI:   Michele [name] is a 35 y.o. female presents for follow up from ER  Seasonal allergies  Patient seen in ER on 4/1 for nasal congestion, non productive cough, occasional sneezing, and t 99 at home, thought to have URTI. COVID negative. Recommended Zyrtec for 7 days. She dose flonase once day. Discharged on 4/1. Presents today for follow up. Reports improvement in symptoms. She still has some itchy eye symptoms.   Flowsheet Row Office Visit from 06/30/2020 in [facility]  PHQ-9 Total Score 0       PERTINENT  PMH / PSH: Cerebral palsy, osteoporosis, seizure disorder, Lennox-Gastaut   OBJECTIVE:   BP 116/61   Pulse 99   SpO2 97%    General: Alert, no acute distress, playful, well appearing  Cardio: Normal S1 and S2, RRR, no r/m/g Pulm: CTAB, normal work of breathing Extremities: bilateral chronic lymphedema    ASSESSMENT/PLAN:   Seasonal allergies Stable, improved on Zyrtec. Refilled Zyrtec. Recommended Claritin eye drops for persistent eye symptoms and to continue nasal flonase.      Towanda Octave, MD PGY-2 [facility]

## 2020-09-27 NOTE — Assessment & Plan Note (Signed)
Stable, improved on Zyrtec. Refilled Zyrtec. Recommended Claritin eye drops for persistent eye symptoms and to continue nasal flonase.

## 2020-09-27 NOTE — Patient Instructions (Signed)
Thank you for coming to see me today. It was a pleasure. Today we discussed her allergies. I recommend continuing 10mg  zyrtec daily, claritin eye drops and flonase nasal spray.  Please follow-up with me as and when needed.   If you have any questions or concerns, please do not hesitate to call the office at [phone].  Best wishes,   Dr [phone]

## 2020-10-24 ENCOUNTER — Encounter (INDEPENDENT_AMBULATORY_CARE_PROVIDER_SITE_OTHER): Payer: Self-pay

## 2020-11-02 ENCOUNTER — Other Ambulatory Visit: Payer: Self-pay | Admitting: Family Medicine

## 2020-11-02 DIAGNOSIS — R600 Localized edema: Secondary | ICD-10-CM

## 2020-11-03 ENCOUNTER — Other Ambulatory Visit (INDEPENDENT_AMBULATORY_CARE_PROVIDER_SITE_OTHER): Payer: Self-pay | Admitting: Family

## 2020-11-03 DIAGNOSIS — G40319 Generalized idiopathic epilepsy and epileptic syndromes, intractable, without status epilepticus: Secondary | ICD-10-CM

## 2020-11-03 NOTE — Telephone Encounter (Signed)
I have a pop up saying banzel needs to be changed please advise

## 2020-11-27 ENCOUNTER — Other Ambulatory Visit: Payer: Self-pay | Admitting: Family Medicine

## 2020-11-27 DIAGNOSIS — K219 Gastro-esophageal reflux disease without esophagitis: Secondary | ICD-10-CM

## 2020-12-07 ENCOUNTER — Ambulatory Visit (INDEPENDENT_AMBULATORY_CARE_PROVIDER_SITE_OTHER): Payer: Medicare Other | Admitting: Family

## 2020-12-07 ENCOUNTER — Other Ambulatory Visit: Payer: Self-pay

## 2020-12-07 ENCOUNTER — Encounter (INDEPENDENT_AMBULATORY_CARE_PROVIDER_SITE_OTHER): Payer: Self-pay | Admitting: Family

## 2020-12-07 VITALS — BP 122/70 | HR 80 | Ht 59.0 in | Wt 218.8 lb

## 2020-12-07 DIAGNOSIS — I890 Lymphedema, not elsewhere classified: Secondary | ICD-10-CM

## 2020-12-07 DIAGNOSIS — G40814 Lennox-Gastaut syndrome, intractable, without status epilepticus: Secondary | ICD-10-CM

## 2020-12-07 DIAGNOSIS — G40319 Generalized idiopathic epilepsy and epileptic syndromes, intractable, without status epilepticus: Secondary | ICD-10-CM

## 2020-12-07 DIAGNOSIS — G808 Other cerebral palsy: Secondary | ICD-10-CM

## 2020-12-07 DIAGNOSIS — F73 Profound intellectual disabilities: Secondary | ICD-10-CM | POA: Diagnosis not present

## 2020-12-07 MED ORDER — TOPAMAX SPRINKLE 25 MG PO CPSP
ORAL_CAPSULE | ORAL | 5 refills | Status: DC
Start: 2020-12-07 — End: 2021-01-25

## 2020-12-07 MED ORDER — BANZEL 40 MG/ML PO SUSP: ORAL | 5 refills | Status: DC

## 2020-12-07 MED ORDER — KEPPRA 100 MG/ML PO SOLN
ORAL | 5 refills | Status: DC
Start: 2020-12-07 — End: 2021-06-08

## 2020-12-07 MED ORDER — DEPAKOTE SPRINKLES 125 MG PO CSDR: DELAYED_RELEASE_CAPSULE | ORAL | 5 refills | Status: DC

## 2020-12-07 MED ORDER — DIASTAT ACUDIAL 20 MG RE GEL
RECTAL | 5 refills | Status: DC
Start: 2020-12-07 — End: 2021-06-08

## 2020-12-07 MED ORDER — LEVOCARNITINE 1 GM/10ML PO SOLN: ORAL | 5 refills | Status: DC

## 2020-12-07 NOTE — Progress Notes (Signed)
Michele [name]   MRN:  [id number]  19-Apr-1986   Provider: Elveria Rising NP-C Location of Care: [facility]rology  Visit type: Routine visit  Last visit: 06/08/2020  Referral source: Asher Muir, MD History from: mom and chcn chart  Brief history:  Copied from previous record: History of Lennox Gastaut encephalopathy with intractable seizures of mixed type; development and intellectual delays, lymphedema and increased tone in her legs. She is taking and tolerating brand Depakote, Keppra, Topamax and Banzel for her seizure disorder. Michele [name] typically turns blue and requires blow by oxygen when seizures occur. She is completely dependent upon others for all activities of daily living. She is cared for at home by her mother.   Today's concerns: Mom reports today that Michele [name] continues to experience one or two brief seizures per month. She also has occasional jerking episodes in her eyes that have not proven to be seizures. Michele [name] has ongoing problems with lymphedema and Mom notes that she has been measured for new elastic hosiery for that.   Michele [name] has been otherwise generally healthy since she was last seen. Mom has no other health concerns for Michele [name] today other than previously mentioned.  Review of systems: Please see HPI for neurologic and other pertinent review of systems. Otherwise all other systems were reviewed and were negative.  Problem List: Patient Active Problem List   Diagnosis Date Noted   Seasonal allergies 09/27/2020   Cervical cancer screening 03/26/2020   Bruise 02/15/2020   COVID-19    AKI (acute kidney injury) (HCC) 08/03/2019   Acute hypoxemic respiratory failure due to COVID-19 [facility]) 08/03/2019   Pneumonia due to COVID-19 virus 08/01/2019   Anasarca 07/09/2019   Allergies 06/02/2019   Upper respiratory symptom 06/02/2019   Decreased urination 01/10/2019   Lennox-Gastaut syndrome (HCC) 07/06/2017   Lymphedema 01/22/2017   Bilateral lower  extremity edema 04/21/2016   Unsteadiness on feet    Encounter for hepatitis C screening test for low risk patient 07/20/2014   Hypernatremia 06/02/2014   Amenorrhea 05/19/2014   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   Morbid obesity (HCC) 12/24/2012   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   URINARY INCONTINENCE 02/25/2010   Full incontinence of feces 02/25/2010   Hypothyroidism 01/29/2009   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     Family history: family history includes Cancer in her maternal aunt; Diabetes in an other family member; Heart disease in her maternal grandmother and another family member; Hypertension in her mother and paternal grandmother; Kidney disease in an other family member; Stroke in her maternal grandfather.   Social history: Social History   Socioeconomic History   Marital status: Single    Spouse name: Not on file   Number of children: 0   Years of education:  Not on file   Highest education level: Not on file  Occupational History   Occupation: Disabled   Tobacco Use   Smoking status: Never   Smokeless tobacco: Never  Vaping Use   Vaping Use: Never used  Substance and Sexual Activity   Alcohol use: No   Drug use: No   Sexual activity: Never  Other Topics Concern   Not on file  Social History Narrative    0 caffeine drinks    Lives with mother who helps ADLs.   Graduated from [employer] in 2010. Now attends day program at [employer].    Enjoys playing in water, ripping paper, watching TV, music and going out.   Social Determinants of Health   Financial Resource Strain: Not on file  Food Insecurity: Not on file  Transportation Needs: Not on file  Physical Activity: Not on file  Stress: Not on file  Social Connections: Not on file  Intimate Partner Violence: Not on file   Past/failed meds: Copied from previous record: Carbamazepine - rash  Allergies: Allergies  Allergen Reactions   Carbamazepine Rash    Immunizations: Immunization History  Administered Date(s) Administered   Influenza Split 04/14/2011, 03/22/2012   Influenza Whole 05/26/2009, 04/04/2010   Influenza,inj,Quad PF,6+ Mos 04/03/2013, 04/04/2014, 03/08/2015, 03/08/2016, 03/26/2017, 04/12/2018, 03/03/2019, 03/26/2020   PFIZER(Purple Top)SARS-COV-2 Vaccination 11/10/2019, 12/01/2019, 05/28/2020   Tdap 01/14/2014    Diagnostics/Screenings:  Physical Exam: BP 122/70   Pulse 80   Ht 4\' 11"  (1.499 m)   Wt 218 lb 12.8 oz (99.2 kg)   BMI 44.19 kg/m   General: obese well developed, well nourished woman, seated in wheelchair, in no evident distress; black hair, brown eyes, even handed Head: normocephalic and atraumatic. Oropharynx benign. No dysmorphic features. Neck: supple Cardiovascular: regular rate and rhythm, no murmurs. Respiratory: clear to auscultation bilaterally Abdomen: bowel sounds present all four quadrants, abdomen soft, non-tender, non-distended. Musculoskeletal: no skeletal deformities or obvious scoliosis. Has increased tone in her legs as well as prominent lymphedema Skin: no rashes or neurocutaneous lesions  Neurologic Exam Mental Status: awake and fully alert. Has no language.  Smiles responsively. Claps her hands frequently and takes my hands to clap together. Tolerant of invasions in to her  space Cranial Nerves: fundoscopic exam - red reflex present.  Unable to fully visualize fundus.  Pupils equal briskly reactive to light.  Turns to localize faces and objects in the periphery. Turns to localize sounds in the periphery. Facial movements are symmetric. Motor: normal functional bulk, tone and strength in the upper extremities, with increased tone in the lower extremities Sensory: withdrawal x 4 Coordination: unable to adequately assess due to patient's inability to participate in examination. No dysmetria when reaching for objects. Gait and Station: unable to stand and bear weight. 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 Bettyjane has neither had nor required imaging or lab studies since the last visit. She is a 35 year old woman with intractable Lennox-Gastaut syndrome with generalized convulsive epilepsy, intellectual disability and lymphedema. She is taking and tolerating  Depakote, Keppra, Topamax, and Banzel for her seizure disorder and will continue on these medications for the foreseeable future. Satoria has one or two brief seizures each month. I asked Mom to let me know if her seizure frequency increased or if the seizures become more severe. I will see Cybele back in follow up in 6 months or sooner if needed. Mom agreed with the plans made today.   The medication list was reviewed and reconciled. No changes were made in the  prescribed medications today. A complete medication list was provided to the patient.  Return in about 6 months (around 06/08/2021).   Allergies as of 12/07/2020       Reactions   Carbamazepine Rash        Medication List        Accurate as of December 07, 2020 11:59 PM. If you have any questions, ask your nurse or doctor.          acetaminophen 650 MG suppository Commonly known as: TYLENOL Place 650 mg rectally every 8 (eight) hours as needed for mild pain.   acetaminophen 500 MG tablet Commonly known as: TYLENOL Take 500 mg by mouth every 6 (six) hours as needed for fever.   Banzel 40 MG/ML Susp Generic drug: Rufinamide TAKE EVERY MORNING AND EVERY NIGHT AT BEDTIME   Calcarb 600/D 600-400 MG-UNIT tablet Generic drug: Calcium Carbonate-Vitamin D Take 1 tablet by mouth daily. Only gives once a week.   cetirizine HCl 1 MG/ML solution Commonly known as: ZYRTEC Take 10 mLs (10 mg total) by mouth daily.   Depakote Sprinkles 125 MG capsule Generic drug: divalproex TAKE 8 CAPSULES BY MOUTH TWICE DAILY   Diastat AcuDial 20 MG Gel Generic drug: diazepam DIAL TO 15 MG, GIVE RECTALLY FOR SEIZURES LASTING 2 MINUTES OR LONGER   feeding supplement Liqd Take 237 mLs by mouth 3 (three) times daily between meals.   fluticasone 50 MCG/ACT nasal spray Commonly known as: FLONASE PLACE 2 SPRAYS INTO BOTH NOSTRILS TWICE DAILY   furosemide 20 MG tablet Commonly known as: LASIX TAKE 1 TABLET BY MOUTH EVERY DAY AS NEEDED FOR SWELLING   Keppra 100 MG/ML solution Generic drug: levETIRAcetam TAKE 12 MLS BY MOUTH EVERY MORNING AND 12 MLS AT NIGHT   lactulose 10 GM/15ML solution Commonly known as: CHRONULAC TAKE 30 MLS BY MOUTH 2 TIMES DAILY.   levOCARNitine 1 GM/10ML solution Commonly known as: CARNITOR TAKE 5 MLS BY MOUTH THREE TIMES DAILY   liver oil-zinc oxide 40 % ointment Commonly known as: DESITIN Apply 1 application topically daily.   nystatin  powder Commonly known as: nystatin APPLY TOPICALLY TO THE AFFECTED AREA WITH EACH DIAPER CHANGE UNTIL RASH IS GONE   pantoprazole sodium 40 mg/20 mL Pack Commonly known as: PROTONIX MIX 1 PACK AND TAKE DAILY AS DIRECTED   Topamax Sprinkle 25 MG capsule Generic drug: topiramate TAKE 6 CAPSULES BY MOUTH EVERY MORNING AND 6 CAPSULES IN THE EVENING WITH FOOD       Total time spent with the patient was 20 minutes, of which 50% or more was spent in counseling and coordination of care.  Elveria Rising NP-C [facility] Ph. [phone] Fax [phone]

## 2020-12-11 ENCOUNTER — Encounter (INDEPENDENT_AMBULATORY_CARE_PROVIDER_SITE_OTHER): Payer: Self-pay | Admitting: Family

## 2020-12-11 NOTE — Patient Instructions (Signed)
Thank you for coming in today.   Instructions for you until your next appointment are as follows: Continue giving Rakhi the seizure medications as prescribed Let me know if the seizures become more frequent or more severe Please sign up for MyChart if you have not done so. Please plan to return for follow up in 6 months or sooner if needed.  At [facility], we are committed to providing exceptional care. You will receive a patient satisfaction survey through text or email regarding your visit today. Your opinion is important to me. Comments are appreciated.

## 2020-12-13 ENCOUNTER — Other Ambulatory Visit: Payer: Self-pay | Admitting: Family Medicine

## 2020-12-17 DIAGNOSIS — Z20822 Contact with and (suspected) exposure to covid-19: Secondary | ICD-10-CM | POA: Diagnosis not present

## 2020-12-22 ENCOUNTER — Ambulatory Visit: Payer: Medicare Other | Attending: Physician Assistant | Admitting: Occupational Therapy

## 2020-12-22 ENCOUNTER — Other Ambulatory Visit: Payer: Self-pay

## 2020-12-22 DIAGNOSIS — I890 Lymphedema, not elsewhere classified: Secondary | ICD-10-CM | POA: Diagnosis not present

## 2020-12-22 NOTE — Therapy (Signed)
[facility] MAIN [facility] SERVICES [address] [place], [place], [address] Phone: [phone]   Fax:  [phone]  Occupational Therapy Treatment  Patient Details  Name: Michele [name] MRN: [id number] Date of Birth: Oct 24, 1985 Referring Provider (OT): Moses Manners, MD   Encounter Date: 12/22/2020   OT End of Session - 12/22/20 0920     Visit Number 2    Number of Visits 6    Date for OT Re-Evaluation 03/22/21    OT Start Time 0903    OT Stop Time 1003    OT Time Calculation (min) 60 min    Activity Tolerance Patient tolerated treatment well;No increased pain             Past Medical History:  Diagnosis Date   Cerebral palsy (HCC)    Hypothyroidism    Lennox-Gastaut syndrome (HCC)    Osteoporosis    Pneumonia    Profound mental retardation    Reflux    Rickets    Seizures (HCC)     Past Surgical History:  Procedure Laterality Date   FOOT MASS EXCISION Right    Gastrocutaneous fistual repair  2012   PEG TUBE PLACEMENT     PEG TUBE REMOVAL  2012 ?    There were no vitals filed for this visit.   Subjective Assessment - 12/22/20 1259     Subjective  Michele [name] returns today follow along and updated compression garment measurements. We completed these in January of this year, but when DME vendor notified Michele [name] that Michele [name] was not yet eligible for Medicaid funded replacements until the 1 yr aniversary of her initial custom knee highs.    Patient is accompanied by: Family member   [name] and primary CG   Pertinent History BLE lymphedema, profound developmental delay , CP, Lennox-Gastaut syndrome, seisure disorder, hypothyroidism, osteoporosis, rickets, non-verbal    Limitations paraplegia, impaired balance, postural impairment,developmental and intellectual delays,increased muscle tone BLE, hx seizures, incontinence. completely dependent for all ADLs    Repetition Increases Symptoms    Special Tests +Stemmer bilaterally     Patient Stated Goals replace existing compression garments fitted in June because they irritate skin                          OT Treatments/Exercises (OP) - 12/22/20 1620       Transfers   Transfers Sit to Stand;Stand to Sit    Sit to Stand From chair/3-in-1;With armrests;With upper extremity assist;2: Max assist    Stand to Sit 2: Max assist;With upper extremity assist;With armrests;To bed;Uncontrolled descent      ADLs   ADL Education Given Yes      Manual Therapy   Manual Therapy Edema management    Edema Management completed anatomical measurements for BLE replacements for custom, ccl 3, flat knit, Elvarex SOFT knee highs and Jobst RELAX devices for HOS. Faxed to DME vendor                    OT Education - 12/22/20 1615     Education Details Reviewed custom compression medicaid replacement rules. Reviewed measurement and DME process. Reviewed fitting and assessment  processes    Person(s) Educated Parent(s)    Methods Explanation;Demonstration;Handout    Comprehension Verbalized understanding;Returned demonstration                 OT Long Term Goals - 06/23/20 1631  OT LONG TERM GOAL #1   Title Pt to follow Lymphedema precautions and prevention strategies with maximum caregiver assistance to limit LE progression and further functional decline.    Baseline Dependent    Time 12    Period Weeks    Status New    Target Date 09/21/20      OT LONG TERM GOAL #2   Title Lymphedema (LE) management/ self-care: Pt able to apply knee length, replacement, custom, flat knit compression knee highs using assistive devices and max CG assistance  with max caregiver assist x 1 to consistent garment wear essential for optimal LE management over time.    Baseline Dependent      Time 12    Period Weeks    Status New    Target Date 09/21/20      OT LONG TERM GOAL #4   Title Lymphedema (LE) management/ self-care:  Pt to tolerate daily compression  wraps, compression garments and/ or HOS devices in keeping w/ prescribed wear regimes within 1 week of issue date of each to progress and retain clinical and functional gains and to limit LE progression.    Baseline dependent    Time 12    Period Weeks    Status New      OT LONG TERM GOAL #5   Title Lymphedema (LE) management/ self-care:  During Management Phase CDT Pt to sustain current limb volumes within 3%, and all other clinical gains achieved during OT treatment iwith maximum caregiver assistance  (to control limb swelling and associated pain/ discomfort, to  limit LE progression, to decrease infection risk and to limit further functional decline.    Baseline dependent    Time 6    Period Months    Status On-going    Target Date 12/20/20                   Plan - 12/22/20 1617     Clinical Impression Statement Completed BLE anatomical measurements for replacement custom compression stockings and for HOS devices to replace worn out like garments. DME vendor will ship to clinic and we will fit garments to ensure effectiveness and comfort. Cont as per POC.    OT Occupational Profile and History Comprehensive Assessment- Review of records and extensive additional review of physical, cognitive, psychosocial history related to current functional performance    Occupational performance deficits (Please refer to evaluation for details): ADL's;IADL's;Rest and Sleep;Work;Leisure;Play;Social Participation    Body Structure / Function / Physical Skills ADL;Decreased knowledge of use of DME;Gait;Obesity;Strength;Balance;Dexterity;Pain;Tone;Edema;UE functional use;ROM;IADL;Endurance;Continence;Coordination;Flexibility;Mobility;Sensation;Skin integrity;Decreased knowledge of precautions    Rehab Potential Good    Clinical Decision Making Multiple treatment options, significant modification of task necessary    Comorbidities Affecting Occupational Performance: May have comorbidities impacting  occupational performance    Modification or Assistance to Complete Evaluation  Max significant modification of tasks or assist is necessary to complete    OT Frequency Other (comment)   Retrn for custom compression garment fitting ASAP. This may require multiple visits if garments need adjustment and remakes. Estimate 3 return visits to cover possible remeasurments and refitting   OT Treatment/Interventions Self-care/ADL training;DME and/or AE instruction;Therapeutic activities   anatomical measurement, specifications,and  fitting for custom compression garments and devices   Plan fit replacements for custom daytime compression garments, including BLE, custom, knee length, Jobst, Elvarex SOFT, ccl 3 ( 34-46 mmHg) ; open toe, t heel, 2.5 SB on tope, oblique top edge    Consulted and Agree with Plan of  Care Family member/caregiver             Patient will benefit from skilled therapeutic intervention in order to improve the following deficits and impairments:   Body Structure / Function / Physical Skills: ADL, Decreased knowledge of use of DME, Gait, Obesity, Strength, Balance, Dexterity, Pain, Tone, Edema, UE functional use, ROM, IADL, Endurance, Continence, Coordination, Flexibility, Mobility, Sensation, Skin integrity, Decreased knowledge of precautions       Visit Diagnosis: Lymphedema, not elsewhere classified    Problem List Patient Active Problem List   Diagnosis Date Noted   Seasonal allergies 09/27/2020   Cervical cancer screening 03/26/2020   Bruise 02/15/2020   COVID-19    AKI (acute kidney injury) (HCC) 08/03/2019   Acute hypoxemic respiratory failure due to COVID-19 [facility]) 08/03/2019   Pneumonia due to COVID-19 virus 08/01/2019   Anasarca 07/09/2019   Allergies 06/02/2019   Upper respiratory symptom 06/02/2019   Decreased urination 01/10/2019   Lennox-Gastaut syndrome (HCC) 07/06/2017   Lymphedema 01/22/2017   Bilateral lower extremity edema 04/21/2016    Unsteadiness on feet    Encounter for hepatitis C screening test for low risk patient 07/20/2014   Hypernatremia 06/02/2014   Amenorrhea 05/19/2014   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   Morbid obesity (HCC) 12/24/2012   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   URINARY INCONTINENCE 02/25/2010   Full incontinence of feces 02/25/2010   Hypothyroidism 01/29/2009   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   Cerebral palsy (HCC) 01/29/2009   GASTROESOPHAGEAL REFLUX DISEASE 01/29/2009   Osteoporosis 01/29/2009   Seizure disorder (HCC) 01/29/2009    Loel Dubonnet, MS, OTR/L, CLT-LANA 12/22/20 4:21 PM  [place] [facility] MAIN [facility] SERVICES [address] [place], [place], [address] Phone: [phone]   Fax:  [phone]  Name: Michele [name] MRN: [id number] Date of Birth: 1986/01/18

## 2021-01-10 ENCOUNTER — Telehealth: Payer: Self-pay | Admitting: Family Medicine

## 2021-01-10 NOTE — Telephone Encounter (Signed)
[employer] is calling to check on the status of the medicaid prior approval for patients compression garmets. They have attempted to send it twice and it doesn't look like we have received it. I did not see it in Dr. Jill Alexanders box or in previously faxed. I confirmed fax number and asked them to resend.

## 2021-01-11 NOTE — Telephone Encounter (Signed)
I haven't seen anything like this.  It would likely go directly to the provider to complete.  Karaline Buresh,CMA

## 2021-01-11 NOTE — Telephone Encounter (Signed)
Dr Allena Katz or Nolon Stalls, have either one of you seen the prior approval for this pt?  Sunday Spillers, CMA

## 2021-01-12 NOTE — Telephone Encounter (Signed)
I have it. Currently on nights. Will get it back to you asap

## 2021-01-13 NOTE — Telephone Encounter (Signed)
Placed in fax pile with 2 sets of clinic notes attached.

## 2021-01-22 ENCOUNTER — Other Ambulatory Visit: Payer: Self-pay | Admitting: Family Medicine

## 2021-01-25 ENCOUNTER — Other Ambulatory Visit (INDEPENDENT_AMBULATORY_CARE_PROVIDER_SITE_OTHER): Payer: Self-pay | Admitting: Family

## 2021-01-25 DIAGNOSIS — G40319 Generalized idiopathic epilepsy and epileptic syndromes, intractable, without status epilepticus: Secondary | ICD-10-CM

## 2021-01-25 MED ORDER — TOPAMAX SPRINKLE 25 MG PO CPSP: ORAL_CAPSULE | ORAL | 5 refills | Status: DC

## 2021-01-31 ENCOUNTER — Other Ambulatory Visit: Payer: Self-pay | Admitting: Family Medicine

## 2021-01-31 DIAGNOSIS — R600 Localized edema: Secondary | ICD-10-CM

## 2021-02-18 ENCOUNTER — Other Ambulatory Visit: Payer: Self-pay

## 2021-02-18 ENCOUNTER — Ambulatory Visit (INDEPENDENT_AMBULATORY_CARE_PROVIDER_SITE_OTHER): Payer: Medicare Other | Admitting: Family Medicine

## 2021-02-18 ENCOUNTER — Encounter: Payer: Self-pay | Admitting: Family Medicine

## 2021-02-18 VITALS — BP 102/70 | HR 74

## 2021-02-18 DIAGNOSIS — Z0000 Encounter for general adult medical examination without abnormal findings: Secondary | ICD-10-CM

## 2021-02-18 DIAGNOSIS — R630 Anorexia: Secondary | ICD-10-CM

## 2021-02-18 DIAGNOSIS — Z23 Encounter for immunization: Secondary | ICD-10-CM | POA: Diagnosis not present

## 2021-02-18 NOTE — Progress Notes (Signed)
     SUBJECTIVE:   CHIEF COMPLAINT / HPI:   Michele [name] is a 35 y.o. female presents for follow up  Brought in by her mother.  Low appetite  For a few days,mom thinks its because of the weather. She is taking ensure. Drinks fluids as normal, pedialyte. Passing urine 3-4 times. Denies fevers, dyspnea, chest pain, abdominal pain, rectal bleeding, melena, hematuria etc, lower abdominal pain, dysuria, frequency, urgency or hematuria.  Negative home covid test.  Flowsheet Row Office Visit from 06/30/2020 in [facility]  PHQ-9 Total Score 0       Presents today for flu shot  PERTINENT  PMH / PSH: Lymphedema, seizure disorder, hypothyrodism, Lennox-Gastaut syndrome   OBJECTIVE:   BP 102/70   Pulse 74   SpO2 98%    General: Alert, no acute distress, pleasant, clapping Cardio: Normal S1 and S2, RRR, no r/m/g Pulm: CTAB, normal work of breathing Extremities: chronic lymphedema present bilaterally Neuro: Cranial nerves grossly intact   ASSESSMENT/PLAN:   Poor appetite Few day history of poor appetite likely 2/2 normal fluctuations in appetite/hot weather. Low suspicion for infection. Additionally pt is fully vaccinate against covid and home covid test was negative. She has been eating smaller meals/snacks compared to her usual. She is still tolerating PO fluids well with normal urine output for her. She is well appearing in clinic today, stable physical exam from previous, moist MM. Recommended mom keeps an eye on her hydration status and urine output, recommended f/u in clinic if she continues to have decreased appetite and is unable to tolerate fluids. Also recommended holding Lasix dose if this dose happens as it will dehydrate her further. Mom is an excellent caregiver for Michele [name] and expressing understanding.  Healthcare maintenance Pt received flu vaccine today.    Towanda Octave, MD PGY-3 [facility]

## 2021-02-18 NOTE — Patient Instructions (Signed)
Thank you for coming to see me today. It was a pleasure. Today we discussed her poor appetite. I do not think it is because of an infection, it might just be an off week for her and because of the hot weather. I recommend monitoring how much urine she is passing and how much she is drinking. As long she has 3-4 wet diapers a day she is fine. If less then this bring her for a follow up and stop the lasix as this will dehydrate her. Give small snacks often.   Please follow-up as needed.   If you have any questions or concerns, please do not hesitate to call the office at [phone].  Best wishes,   Dr Allena Katz

## 2021-02-21 DIAGNOSIS — R630 Anorexia: Secondary | ICD-10-CM

## 2021-02-21 DIAGNOSIS — Z0000 Encounter for general adult medical examination without abnormal findings: Secondary | ICD-10-CM | POA: Insufficient documentation

## 2021-02-21 HISTORY — DX: Anorexia: R63.0

## 2021-02-21 NOTE — Assessment & Plan Note (Signed)
Pt received flu vaccine today. 

## 2021-02-21 NOTE — Assessment & Plan Note (Signed)
Few day history of poor appetite likely 2/2 normal fluctuations in appetite/hot weather. Low suspicion for infection. Additionally pt is fully vaccinate against covid and home covid test was negative. She has been eating smaller meals/snacks compared to her usual. She is still tolerating PO fluids well with normal urine output for her. She is well appearing in clinic today, stable physical exam from previous, moist MM. Recommended mom keeps an eye on her hydration status and urine output, recommended f/u in clinic if she continues to have decreased appetite and is unable to tolerate fluids. Also recommended holding Lasix dose if this dose happens as it will dehydrate her further. Mom is an excellent caregiver for Michele [name] and expressing understanding.

## 2021-02-22 ENCOUNTER — Other Ambulatory Visit (INDEPENDENT_AMBULATORY_CARE_PROVIDER_SITE_OTHER): Payer: Self-pay | Admitting: Family

## 2021-02-22 ENCOUNTER — Other Ambulatory Visit: Payer: Self-pay

## 2021-02-22 ENCOUNTER — Ambulatory Visit: Payer: Medicare Other | Attending: Physician Assistant | Admitting: Occupational Therapy

## 2021-02-22 DIAGNOSIS — G40319 Generalized idiopathic epilepsy and epileptic syndromes, intractable, without status epilepticus: Secondary | ICD-10-CM

## 2021-02-22 DIAGNOSIS — I890 Lymphedema, not elsewhere classified: Secondary | ICD-10-CM | POA: Insufficient documentation

## 2021-02-22 NOTE — Therapy (Signed)
[facility] [place] [facility] MAIN [facility] SERVICES [address] [place], [place], [address] Phone: [phone]   Fax:  [phone]  Occupational Therapy Treatment  Patient Details  Name: Michele [name] MRN: [id number] Date of Birth: 12-Jun-1986 Referring Provider (OT): Moses Manners, MD   Encounter Date: 02/22/2021   OT End of Session - 02/22/21 0809     Visit Number 3    Date for OT Re-Evaluation 03/22/21    OT Start Time 0803    OT Stop Time 0845    OT Time Calculation (min) 42 min    Activity Tolerance Patient tolerated treatment well;No increased pain;Other (comment)   intellectual delay, non verbal   Behavior During Therapy WFL for tasks assessed/performed             Past Medical History:  Diagnosis Date   Cerebral palsy (HCC)    Hypothyroidism    Lennox-Gastaut syndrome (HCC)    Osteoporosis    Pneumonia    Profound mental retardation    Reflux    Rickets    Seizures (HCC)     Past Surgical History:  Procedure Laterality Date   FOOT MASS EXCISION Right    Gastrocutaneous fistual repair  2012   PEG TUBE PLACEMENT     PEG TUBE REMOVAL  2012 ?    There were no vitals filed for this visit.   Subjective Assessment - 02/22/21 0809     Subjective  [name] returns today for initial   fitting of BLE custom compression garment and HOS replacements measured for on 12/22/2020. [name] is unable to verbalize her pain level, but using her mother's knowledge of Lennan and our observations this morning Miles does not appear to be in pain, or other distress.    Patient is accompanied by: Family member   Mother and primary CG   Pertinent History BLE lymphedema, profound developmental delay , CP, Lennox-Gastaut syndrome, seisure disorder, hypothyroidism, osteoporosis, rickets, non-verbal    Limitations paraplegia, impaired balance, postural impairment,developmental and intellectual delays,increased muscle tone BLE, hx seizures, incontinence. completely  dependent for all ADLs    Repetition Increases Symptoms    Special Tests +Stemmer bilaterally    Patient Stated Goals replace existing compression garments fitted in June because they irritate skin                          OT Treatments/Exercises (OP) - 02/22/21 1100       ADLs   ADL Education Given Yes      Manual Therapy   Manual Therapy Edema management    Edema Management initial fitting for replacement custom knee length Elvarex SOFT , ccl 3, AD and BLE Jobst RELAX ccl 2                    OT Education - 02/22/21 1102     Education Details Reviewed custom compression medicaid replacement rules. Reviewed measurement and DME process. Reviewed fitting and assessment  processes    Person(s) Educated Parent(s)    Methods Explanation;Demonstration;Handout    Comprehension Verbalized understanding;Returned demonstration                 OT Long Term Goals - 06/23/20 1631       OT LONG TERM GOAL #1   Title Pt to follow Lymphedema precautions and prevention strategies with maximum caregiver assistance to limit LE progression and further functional decline.    Baseline Dependent  Time 12    Period Weeks    Status New    Target Date 09/21/20      OT LONG TERM GOAL #2   Title Lymphedema (LE) management/ self-care: Pt able to apply knee length, replacement, custom, flat knit compression knee highs using assistive devices and max CG assistance  with max caregiver assist x 1 to consistent garment wear essential for optimal LE management over time.    Baseline Dependent      Time 12    Period Weeks    Status New    Target Date 09/21/20      OT LONG TERM GOAL #4   Title Lymphedema (LE) management/ self-care:  Pt to tolerate daily compression wraps, compression garments and/ or HOS devices in keeping w/ prescribed wear regimes within 1 week of issue date of each to progress and retain clinical and functional gains and to limit LE progression.     Baseline dependent    Time 12    Period Weeks    Status New      OT LONG TERM GOAL #5   Title Lymphedema (LE) management/ self-care:  During Management Phase CDT Pt to sustain current limb volumes within 3%, and all other clinical gains achieved during OT treatment iwith maximum caregiver assistance  (to control limb swelling and associated pain/ discomfort, to  limit LE progression, to decrease infection risk and to limit further functional decline.    Baseline dependent    Time 6    Period Months    Status On-going    Target Date 12/20/20                   Plan - 02/22/21 1040     Clinical Impression Statement Completed initial fitting for BLE custom compression stockings and  HOS devices.  LLE Jobst RELAX fit is AOK. All other garments and device needs remakes to correct fit and omitted options. RLE Relax is too long at foot portion and upper edge.  Need Remakes.  Both R and L Elvarex Soft stocking are missing 2.5 cm top band,  and it's difficult to telll if either garment has oblique top edge at D. Need BLE Elvarex SOFT remakes .Requested DME vendor use new measurements taken today due to changes during long fitting interval. Garmet measurements were faxed initially on 7/6, but not fitted until this date, 2 months later. Ranie's mother took garments with her and agrees  with plan to return them PRN to  vendor. Will complete fitting when remakes arrive. No other OT Rx planned during interval;    OT Occupational Profile and History Comprehensive Assessment- Review of records and extensive additional review of physical, cognitive, psychosocial history related to current functional performance    Occupational performance deficits (Please refer to evaluation for details): ADL's;IADL's;Rest and Sleep;Work;Leisure;Play;Social Participation    Body Structure / Function / Physical Skills ADL;Decreased knowledge of use of DME;Gait;Obesity;Strength;Balance;Dexterity;Pain;Tone;Edema;UE  functional use;ROM;IADL;Endurance;Continence;Coordination;Flexibility;Mobility;Sensation;Skin integrity;Decreased knowledge of precautions    Rehab Potential Good    Clinical Decision Making Multiple treatment options, significant modification of task necessary    Comorbidities Affecting Occupational Performance: May have comorbidities impacting occupational performance    Modification or Assistance to Complete Evaluation  Max significant modification of tasks or assist is necessary to complete    OT Frequency Other (comment)   Retrn for custom compression garment fitting ASAP. This may require multiple visits if garments need adjustment and remakes. Estimate 3 return visits to cover possible remeasurments and refitting  OT Treatment/Interventions Self-care/ADL training;DME and/or AE instruction;Therapeutic activities   anatomical measurement, specifications,and  fitting for custom compression garments and devices   Plan fit replacements for custom daytime compression garments, including BLE, custom, knee length, Jobst, Elvarex SOFT, ccl 3 ( 34-46 mmHg) ; open toe, t heel, 2.5 SB on top, oblique top edge    Recommended Other Services mother    Consulted and Agree with Plan of Care Family member/caregiver;Patient             Patient will benefit from skilled therapeutic intervention in order to improve the following deficits and impairments:   Body Structure / Function / Physical Skills: ADL, Decreased knowledge of use of DME, Gait, Obesity, Strength, Balance, Dexterity, Pain, Tone, Edema, UE functional use, ROM, IADL, Endurance, Continence, Coordination, Flexibility, Mobility, Sensation, Skin integrity, Decreased knowledge of precautions       Visit Diagnosis: Lymphedema, not elsewhere classified    Problem List Patient Active Problem List   Diagnosis Date Noted   Poor appetite 02/21/2021   Healthcare maintenance 02/21/2021   Seasonal allergies 09/27/2020   Acute hypoxemic  respiratory failure due to COVID-19 [facility]) 08/03/2019   Upper respiratory symptom 06/02/2019   Lennox-Gastaut syndrome (HCC) 07/06/2017   Lymphedema 01/22/2017   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   Morbid obesity (HCC) 12/24/2012   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   URINARY INCONTINENCE 02/25/2010   Full incontinence of feces 02/25/2010   Hypothyroidism 01/29/2009   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   Cerebral palsy (HCC) 01/29/2009   Osteoporosis 01/29/2009   Seizure disorder (HCC) 01/29/2009    Loel Dubonnet, MS, OTR/L, CLT-LANA 02/22/21 11:07 AM   [place] [facility] MAIN [facility] SERVICES [address] [place], [place], [address] Phone: [phone]   Fax:  [phone]  Name: LAKENDRIA NICASTRO MRN: [id number] Date of Birth: 06-27-1985

## 2021-02-22 NOTE — Telephone Encounter (Signed)
Spoke with [facility] regarding refill request for Depakote Sprinkles 125 mg. Per technician there are 5 refills remaining on the rx sent over 12/07/20 and this refill request was sent in error by the pharmacy.

## 2021-03-21 ENCOUNTER — Other Ambulatory Visit: Payer: Self-pay

## 2021-03-21 ENCOUNTER — Ambulatory Visit: Payer: Medicare Other | Attending: Family Medicine | Admitting: Occupational Therapy

## 2021-03-21 DIAGNOSIS — I890 Lymphedema, not elsewhere classified: Secondary | ICD-10-CM | POA: Diagnosis not present

## 2021-03-21 NOTE — Therapy (Signed)
[facility] MAIN [facility] SERVICES [address] [place], [place], [address] Phone: [phone]   Fax:  [phone]  Occupational Therapy Treatment  Patient Details  Name: Michele [name] MRN: [id number] Date of Birth: 1985-07-31 Referring Provider (OT): Moses Manners, MD   Encounter Date: 03/21/2021   OT End of Session - 03/21/21 0915     Visit Number 4    Number of Visits 6    Date for OT Re-Evaluation 03/22/21    OT Start Time 0905    OT Stop Time 0935    OT Time Calculation (min) 30 min    Activity Tolerance Patient tolerated treatment well;No increased pain;Other (comment)   intellectual delay, non verbal   Behavior During Therapy WFL for tasks assessed/performed             Past Medical History:  Diagnosis Date   Cerebral palsy (HCC)    Hypothyroidism    Lennox-Gastaut syndrome (HCC)    Osteoporosis    Pneumonia    Profound mental retardation    Reflux    Rickets    Seizures (HCC)     Past Surgical History:  Procedure Laterality Date   FOOT MASS EXCISION Right    Gastrocutaneous fistual repair  2012   PEG TUBE PLACEMENT     PEG TUBE REMOVAL  2012 ?    There were no vitals filed for this visit.   Subjective Assessment - 03/21/21 1128     Subjective  Michele [name] returns today for 2nd fitting of REMAKES of BLE custom compression garments and RLE HOS device based on adjusted measurements taken at initial fitting. Michele [name] is unable to verbalize her pain level, but using her mother's knowledge of Michele [name] and our observations  Michele [name] does not appear to be in pain.    Patient is accompanied by: Family member   Mother and primary CG   Pertinent History BLE lymphedema, profound developmental delay , CP, Lennox-Gastaut syndrome, seisure disorder, hypothyroidism, osteoporosis, rickets, non-verbal    Limitations paraplegia, impaired balance, postural impairment,developmental and intellectual delays,increased muscle tone BLE, hx seizures,  incontinence. completely dependent for all ADLs    Repetition Increases Symptoms    Special Tests +Stemmer bilaterally    Patient Stated Goals replace existing compression garments fitted in June because they irritate skin                          OT Treatments/Exercises (OP) - 03/21/21 1130       ADLs   ADL Education Given Yes      Manual Therapy   Manual Therapy Edema management    Edema Management 2nd fitting for replacement custom knee length Elvarex SOFT , ccl 3, AD and RLE Jobst RELAX ccl 2. Excellent garment fit, but incorrect E. Classic fabric sent. Need remakes. RLE Jobst RELAX fit is Conservation officer, nature.                    OT Education - 03/21/21 1132     Education Details Reviewed custom compression garment fit and function, wear and garnment/device care . Reviwed fitting and remake proceses. Reviewed precautions.    Person(s) Educated Parent(s);Patient    Methods Explanation;Demonstration;Handout    Comprehension Verbalized understanding;Returned demonstration                 OT Long Term Goals - 06/23/20 1631       OT LONG TERM GOAL #1  Title Pt to follow Lymphedema precautions and prevention strategies with maximum caregiver assistance to limit LE progression and further functional decline.    Baseline Dependent    Time 12    Period Weeks    Status New    Target Date 09/21/20      OT LONG TERM GOAL #2   Title Lymphedema (LE) management/ self-care: Pt able to apply knee length, replacement, custom, flat knit compression knee highs using assistive devices and max CG assistance  with max caregiver assist x 1 to consistent garment wear essential for optimal LE management over time.    Baseline Dependent      Time 12    Period Weeks    Status New    Target Date 09/21/20      OT LONG TERM GOAL #4   Title Lymphedema (LE) management/ self-care:  Pt to tolerate daily compression wraps, compression garments and/ or HOS devices in keeping w/  prescribed wear regimes within 1 week of issue date of each to progress and retain clinical and functional gains and to limit LE progression.    Baseline dependent    Time 12    Period Weeks    Status New      OT LONG TERM GOAL #5   Title Lymphedema (LE) management/ self-care:  During Management Phase CDT Pt to sustain current limb volumes within 3%, and all other clinical gains achieved during OT treatment iwith maximum caregiver assistance  (to control limb swelling and associated pain/ discomfort, to  limit LE progression, to decrease infection risk and to limit further functional decline.    Baseline dependent    Time 6    Period Months    Status On-going    Target Date 12/20/20                   Plan - 03/21/21 1120     Clinical Impression Statement Completed 2nd fitting for REMAKES of BLE custom compression stockings and  for REMAKE of RLE HOS device ising new measurements taken at previous fitting due to 2 month wait for initial stockings. RLE Relax remake fit is Conservation officer, nature. No further action needed. R and L Elvarex Soft stocking are correct with added 2.5 cm top band and oblique top edges at D, but manufacturer sent Elvarex CLASSIC fabric instead of requested Elvarex SOFT. Need 2nd remake for custom knee length stockings using correct SOFT fabric. Fit of these stockings is AOK. Pt/cargiver edu for garment wear and care and reviewed replacement schedule. Requested DME vendor ship 2nd remakes directly to Pt's mother, if possible, since repeated trips to the clinic may pose a hardship for this   patient with severe developmental delays.    OT Occupational Profile and History Comprehensive Assessment- Review of records and extensive additional review of physical, cognitive, psychosocial history related to current functional performance    Occupational performance deficits (Please refer to evaluation for details): ADL's;IADL's;Rest and Sleep;Work;Leisure;Play;Social Participation    Body  Structure / Function / Physical Skills ADL;Decreased knowledge of use of DME;Gait;Obesity;Strength;Balance;Dexterity;Pain;Tone;Edema;UE functional use;ROM;IADL;Endurance;Continence;Coordination;Flexibility;Mobility;Sensation;Skin integrity;Decreased knowledge of precautions    Rehab Potential Good    Clinical Decision Making Multiple treatment options, significant modification of task necessary    Comorbidities Affecting Occupational Performance: May have comorbidities impacting occupational performance    Modification or Assistance to Complete Evaluation  Max significant modification of tasks or assist is necessary to complete    OT Frequency Other (comment)   Retrn for custom compression garment fitting ASAP.  This may require multiple visits if garments need adjustment and remakes. Estimate 3 return visits to cover possible remeasurments and refitting   OT Treatment/Interventions Self-care/ADL training;DME and/or AE instruction;Therapeutic activities   anatomical measurement, specifications,and  fitting for custom compression garments and devices   Plan fit replacements for custom daytime compression garments, including BLE, custom, knee length, Jobst, Elvarex SOFT, ccl 3 ( 34-46 mmHg) ; open toe, t heel, 2.5 SB on top, oblique top edge    Recommended Other Services mother    Consulted and Agree with Plan of Care Family member/caregiver;Patient             Patient will benefit from skilled therapeutic intervention in order to improve the following deficits and impairments:   Body Structure / Function / Physical Skills: ADL, Decreased knowledge of use of DME, Gait, Obesity, Strength, Balance, Dexterity, Pain, Tone, Edema, UE functional use, ROM, IADL, Endurance, Continence, Coordination, Flexibility, Mobility, Sensation, Skin integrity, Decreased knowledge of precautions       Visit Diagnosis: Lymphedema, not elsewhere classified    Problem List Patient Active Problem List   Diagnosis  Date Noted   Poor appetite 02/21/2021   Healthcare maintenance 02/21/2021   Seasonal allergies 09/27/2020   Acute hypoxemic respiratory failure due to COVID-19 [facility]) 08/03/2019   Upper respiratory symptom 06/02/2019   Lennox-Gastaut syndrome (HCC) 07/06/2017   Lymphedema 01/22/2017   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 [place], [place], [address] Phone: [phone]   Fax:  [phone]  Name: Michele [name] MRN: [id number] Date of Birth: January 20, 1986

## 2021-05-01 ENCOUNTER — Other Ambulatory Visit: Payer: Self-pay | Admitting: Family Medicine

## 2021-05-01 DIAGNOSIS — R600 Localized edema: Secondary | ICD-10-CM

## 2021-05-09 ENCOUNTER — Ambulatory Visit (INDEPENDENT_AMBULATORY_CARE_PROVIDER_SITE_OTHER): Payer: Medicare Other | Admitting: Family Medicine

## 2021-05-09 ENCOUNTER — Other Ambulatory Visit: Payer: Self-pay

## 2021-05-09 ENCOUNTER — Encounter (HOSPITAL_COMMUNITY): Payer: Self-pay | Admitting: Emergency Medicine

## 2021-05-09 ENCOUNTER — Emergency Department (HOSPITAL_COMMUNITY)
Admission: EM | Admit: 2021-05-09 | Discharge: 2021-05-09 | Disposition: A | Discharge status: Home / Self Care | Payer: Medicare Other | Attending: Emergency Medicine | Admitting: Emergency Medicine

## 2021-05-09 ENCOUNTER — Emergency Department (HOSPITAL_COMMUNITY): Payer: Medicare Other

## 2021-05-09 VITALS — BP 87/66 | HR 85 | Temp 98.6°F | Ht 59.0 in

## 2021-05-09 DIAGNOSIS — R062 Wheezing: Secondary | ICD-10-CM | POA: Diagnosis not present

## 2021-05-09 DIAGNOSIS — E039 Hypothyroidism, unspecified: Secondary | ICD-10-CM | POA: Insufficient documentation

## 2021-05-09 DIAGNOSIS — R0989 Other specified symptoms and signs involving the circulatory and respiratory systems: Secondary | ICD-10-CM | POA: Insufficient documentation

## 2021-05-09 DIAGNOSIS — R059 Cough, unspecified: Secondary | ICD-10-CM | POA: Diagnosis not present

## 2021-05-09 DIAGNOSIS — F79 Unspecified intellectual disabilities: Secondary | ICD-10-CM | POA: Insufficient documentation

## 2021-05-09 DIAGNOSIS — G809 Cerebral palsy, unspecified: Secondary | ICD-10-CM | POA: Diagnosis not present

## 2021-05-09 DIAGNOSIS — Z20822 Contact with and (suspected) exposure to covid-19: Secondary | ICD-10-CM | POA: Diagnosis not present

## 2021-05-09 DIAGNOSIS — J069 Acute upper respiratory infection, unspecified: Secondary | ICD-10-CM | POA: Diagnosis not present

## 2021-05-09 DIAGNOSIS — Z8616 Personal history of COVID-19: Secondary | ICD-10-CM | POA: Insufficient documentation

## 2021-05-09 DIAGNOSIS — R509 Fever, unspecified: Secondary | ICD-10-CM | POA: Diagnosis not present

## 2021-05-09 LAB — RESP PANEL BY RT-PCR (FLU A&B, COVID) ARPGX2
Influenza A by PCR: NEGATIVE
Influenza B by PCR: NEGATIVE
SARS Coronavirus 2 by RT PCR: NEGATIVE

## 2021-05-09 MED ORDER — IPRATROPIUM-ALBUTEROL 0.5-2.5 (3) MG/3ML IN SOLN
3.0000 mL | Freq: Once | RESPIRATORY_TRACT | Status: DC
  Filled 2021-05-09: qty 3

## 2021-05-09 MED ORDER — ALBUTEROL SULFATE (2.5 MG/3ML) 0.083% IN NEBU: 2.5000 mg | INHALATION_SOLUTION | Freq: Four times a day (QID) | RESPIRATORY_TRACT | 0 refills | Status: DC | PRN

## 2021-05-09 MED ORDER — IPRATROPIUM-ALBUTEROL 0.5-2.5 (3) MG/3ML IN SOLN
RESPIRATORY_TRACT | Status: AC
  Filled 2021-05-09: qty 3

## 2021-05-09 NOTE — ED Provider Notes (Signed)
[facility] EMERGENCY DEPARTMENT Provider Note   CSN: [id number] Arrival date & time: 05/09/21  0058     History Chief Complaint  Patient presents with   Fever / Cough / Chest Congestion     Michele [name] is a 35 y.o. female.  Patient brought to the emergency department by her mother with chief complaint of fever, cough, and congestion.  Onset of symptoms was yesterday.  Patient has history of cerebral palsy and mental retardation.  Mother reports that she has tried giving some Tylenol.  She is also given Robitussin.  She states that she has had her flu and COVID shots.  The history is provided by the patient. No language interpreter was used.      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     OB History   No obstetric history on file.     Family History  Problem Relation Age of Onset    Hypertension Mother    Heart disease Maternal Grandmother        Died at 56   Stroke Maternal Grandfather        died at 26   Hypertension Paternal Grandmother    Diabetes Other        MGGM   Heart disease Other        MGGF   Kidney disease Other        Maternal Great Aunt    Cancer Maternal Aunt        lung   Colon cancer Neg Hx     Social History   Tobacco Use   Smoking status: Never   Smokeless tobacco: Never  Vaping Use   Vaping Use: Never used  Substance Use Topics   Alcohol use: No   Drug use: No    Home Medications Prior to Admission medications   Medication Sig Start Date End Date Taking? Authorizing Provider  acetaminophen (TYLENOL) 500 MG tablet Take 500 mg by mouth every 6 (six) hours as needed for fever.    [provider]  acetaminophen (TYLENOL) 650 MG suppository Place 650 mg rectally every 8 (eight) hours as needed for mild pain.    [provider]  BANZEL 40 MG/ML SUSP TAKE EVERY MORNING AND EVERY NIGHT AT BEDTIME 12/07/20   Elveria Rising, NP  Calcium Carbonate-Vitamin D (CALCARB 600/D)  600-400 MG-UNIT tablet Take 1 tablet by mouth daily. Only gives once a week.    [provider]  cetirizine HCl (ZYRTEC) 1 MG/ML solution Take 10 mLs (10 mg total) by mouth daily. 09/27/20 10/27/20  Towanda Octave, MD  DEPAKOTE SPRINKLES 125 MG capsule TAKE 8 CAPSULES BY MOUTH TWICE DAILY 12/07/20   Elveria Rising, NP  DIASTAT ACUDIAL 20 MG GEL DIAL TO 15 MG, GIVE RECTALLY FOR SEIZURES LASTING 2 MINUTES OR LONGER 12/07/20   Elveria Rising, NP  feeding supplement, ENSURE ENLIVE, (ENSURE ENLIVE) LIQD Take 237 mLs by mouth 3 (three) times daily between meals. 05/30/19   Towanda Octave, MD  fluticasone (FLONASE) 50 MCG/ACT nasal spray PLACE 2 SPRAYS INTO BOTH NOSTRILS TWICE DAILY 08/02/20   Towanda Octave, MD  furosemide (LASIX) 20 MG tablet TAKE 1 TABLET BY MOUTH EVERY DAY AS NEEDED FOR SWELLING 05/02/21   Dana Allan, MD  KEPPRA 100  MG/ML solution TAKE 12 MLS BY MOUTH EVERY MORNING AND 12 MLS AT NIGHT 12/07/20   Elveria Rising, NP  lactulose (CHRONULAC) 10 GM/15ML solution TAKE 30 MLS BY MOUTH 2 TIMES DAILY. 12/02/20   Towanda Octave, MD  levOCARNitine (CARNITOR) 1 GM/10ML solution TAKE 5 MLS BY MOUTH THREE TIMES DAILY 12/07/20   Elveria Rising, NP  liver oil-zinc oxide (DESITIN) 40 % ointment Apply 1 application topically daily.    [provider]  nystatin powder APPLY TOPICALLY TO THE AFFECTED AREA WITH EACH DIAPER CHANGE UNTIL RASH IS GONE 01/24/21   Towanda Octave, MD  pantoprazole sodium (PROTONIX) 40 mg/20 mL PACK MIX 1 PACK AND TAKE DAILY AS DIRECTED 12/02/20   Towanda Octave, MD  TOPAMAX SPRINKLE 25 MG capsule TAKE 6 CAPSULES BY MOUTH EVERY MORNING AND 6 CAPSULES IN THE EVENING WITH FOOD 01/25/21   Elveria Rising, NP    Allergies    Carbamazepine  Review of Systems   Review of Systems  All other systems reviewed and are negative.  Physical Exam Updated Vital Signs BP 105/66 (BP Location: Left Arm)   Pulse 90   Temp 99.1 F (37.3 C) (Oral)   Resp 15   LMP 04/27/2021   SpO2 90%   Physical Exam Vitals and nursing note reviewed.  Constitutional:      General: She is not in acute distress.    Appearance: She is well-developed.     Comments: Wheelchair-bound  HENT:     Head: Normocephalic and atraumatic.  Eyes:     Conjunctiva/sclera: Conjunctivae normal.  Cardiovascular:     Rate and Rhythm: Normal rate and regular rhythm.     Heart sounds: No murmur heard. Pulmonary:     Effort: Pulmonary effort is normal. No respiratory distress.     Breath sounds: Normal breath sounds.     Comments: Some mild wheezes No respiratory distress or increased work of breathing Abdominal:     Palpations: Abdomen is soft.     Tenderness: There is no abdominal tenderness.  Musculoskeletal:        General: No swelling.     Cervical back: Neck supple.  Skin:    General: Skin is warm and dry.     Capillary  Refill: Capillary refill takes less than 2 seconds.  Neurological:     Mental Status: She is alert.  Psychiatric:        Mood and Affect: Mood normal.    ED Results / Procedures / Treatments   Labs (all labs ordered are listed, but only abnormal results are displayed) Labs  Reviewed - No data to display  EKG None  Radiology DG Chest 2 View  Result Date: 05/09/2021 CLINICAL DATA:  Cough and fever for 4 days EXAM: CHEST - 2 VIEW COMPARISON:  09/16/2020 FINDINGS: Cardiac shadow is enlarged but stable. Increased central bronchial markings are noted without focal confluent infiltrate. No effusion is seen. No bony abnormality is noted. IMPRESSION: Increased central bronchial markings which may be related to a viral process. Electronically Signed   By: Alcide Clever M.D.   On: 05/09/2021 01:45    Procedures Procedures   Medications Ordered in ED Medications - No data to display  ED Course  I have reviewed the triage vital signs and the nursing notes.  Pertinent labs & imaging results that were available during my care of the patient were reviewed by me and considered in my medical decision making (see chart for details).    MDM Rules/Calculators/A&P                           Pt CXR negative for acute infiltrate. Patients symptoms are consistent with URI, likely viral etiology. Discussed that antibiotics are not indicated for viral infections. Pt will be discharged with symptomatic treatment.  Verbalizes understanding and is agreeable with plan. Pt is hemodynamically stable & in NAD prior to dc.  Feeling much better after neb.  No further wheezing.  Final Clinical Impression(s) / ED Diagnoses Final diagnoses:  Upper respiratory tract infection, unspecified type    Rx / DC Orders ED Discharge Orders          Ordered    albuterol (PROVENTIL) (2.5 MG/3ML) 0.083% nebulizer solution  Every 6 hours PRN        05/09/21 0321             Roxy Horseman, PA-C 05/09/21  0324    Dione Booze, MD 05/09/21 [phone]

## 2021-05-09 NOTE — Progress Notes (Signed)
    SUBJECTIVE:   CHIEF COMPLAINT / HPI:   Ms. Mccarn is a 35 yo with CP, seizure disorder, congenital  quadriplegia, profound intellectual disability who presents with mom for URI symptoms. Was seen in the ED today for fever, cough, congestion. CXR showed increased central bronchial markings likely due to a viral infection.Marland Kitchen COVID and flu negative. Was given albuterol nebulizer for wheezing.  Discharge with symptomatic treatment for likely viral infection.  Mom reports that she told the ED patient was a family med patient and she was able to be seen very quickly. Today in clinic she had no further concerns. Was debating about getting the booster today but mom states she will wait and clear this infection first.    OBJECTIVE:   BP (!) 87/66   Pulse 85   Temp 98.6 F (37 C)   Ht 4\' 11"  (1.499 m)   LMP 04/27/2021   SpO2 100%   BMI 44.19 kg/m    Physical exam General: in wheel chair, NAD Cardiovascular: RRR, no murmurs Lungs: CTAB. Normal WOB Abdomen: soft, non-distended Skin: warm, dry. No edema  ASSESSMENT/PLAN:   No problem-specific Assessment & Plan notes found for this encounter.   URI symptoms  Was seen in ED earlier today for cough, fever, congestion, rhinorrhea. Tested negative for COVID and flu. CXR showed increased central bronchial markings likely due to a viral infection. Was given albuterol for some wheezing and recommended symptomatic management. Today on exam patient well appearing and lungs sound clear. Afebrile. Discussed symptomatic management and mom opted to wait for the COVID booster until after she clears her infection. Gave strict return precautions.   13/02/2021, DO [facility]

## 2021-05-09 NOTE — Patient Instructions (Addendum)
It was great seeing you today!  You were seen for cough, congestion likely due to a viral infection. I am glad to see you are doing ok and that you have the medications from the ED earlier today.  Continue symptomatic management with Tylenol for fevers, your breathing treatment as needed, and staying hydrated.  Return to the ED for persistent fevers above 102, inability to eat or drink, difficulty breathing.   Feel free to call with any questions or concerns at any time, at [phone].   Take care,  Dr. Cora Collum [place] [facility]    Viral Respiratory Infection A respiratory infection is an illness that affects part of the respiratory system, such as the lungs, nose, or throat. A respiratory infection that is caused by a virus is called a viral respiratory infection. Common types of viral respiratory infections include: A cold. The flu (influenza). A respiratory syncytial virus (RSV) infection. What are the causes? This condition is caused by a virus. The virus may spread through contact with droplets or direct contact with infected people or their mucus or secretions. The virus may spread from person to person (is contagious). What are the signs or symptoms? Symptoms of this condition include: A stuffy or runny nose. A sore throat or cough. Shortness of breath or difficulty breathing. Yellow or green mucus (sputum). Other symptoms may include: A fever. Sweating or chills. Fatigue. Achy muscles. A headache. How is this diagnosed? This condition may be diagnosed based on: Your symptoms. A physical exam. Testing of secretions from the nose or throat. Chest X-ray. How is this treated? This condition may be treated with medicines, such as: Antiviral medicine. This may shorten the length of time a person has symptoms. Expectorants. These make it easier to cough up mucus. Decongestant nasal sprays. Acetaminophen or NSAIDs, such as ibuprofen, to relieve  fever and pain. Antibiotic medicines are not prescribed for viral infections.This is because antibiotics are designed to kill bacteria. They do not kill viruses. Follow these instructions at home: Managing pain and congestion Take over-the-counter and prescription medicines only as told by your health care provider. If you have a sore throat, gargle with a mixture of salt and water 3-4 times a day or as needed. To make salt water, completely dissolve -1 tsp (3-6 g) of salt in 1 cup (237 mL) of warm water. Use nose drops made from salt water to ease congestion and soften raw skin around your nose. Take 2 tsp (10 mL) of honey at bedtime to lessen coughing at night. Do not give honey to children who are younger than 1 year. Drink enough fluid to keep your urine pale yellow. This helps prevent dehydration and helps loosen up mucus. General instructions  Rest as much as possible. Do not drink alcohol. Do not use any products that contain nicotine or tobacco. These products include cigarettes, chewing tobacco, and vaping devices, such as e-cigarettes. If you need help quitting, ask your health care provider. Keep all follow-up visits. This is important. How is this prevented?   Get an annual flu shot. You may get the flu shot in late summer, fall, or winter. Ask your health care provider when you should get your flu shot. Avoid spreading your infection to other people. If you are sick: Wash your hands with soap and water often, especially after you cough or sneeze. Wash for at least 20 seconds. If soap and water are not available, use alcohol-based hand sanitizer. Cover your mouth when  you cough. Cover your nose and mouth when you sneeze. Do not share cups or eating utensils. Clean commonly used objects often. Clean commonly touched surfaces. Stay home from work or school as told by your health care provider. Avoid contact with people who are sick during cold and flu season. This is generally  fall and winter. Contact a health care provider if: Your symptoms last for 10 days or longer. Your symptoms get worse over time. You have severe sinus pain in your face or forehead. The glands in your jaw or neck become very swollen. You have shortness of breath. Get help right away if you: Feel pain or pressure in your chest. Have trouble breathing. Faint or feel like you will faint. Have severe and persistent vomiting. Feel confused or disoriented. These symptoms may represent a serious problem that is an emergency. Do not wait to see if the symptoms will go away. Get medical help right away. Call your local emergency services (911 in the [place] Do not drive yourself to the hospital. Summary A respiratory infection is an illness that affects part of the respiratory system, such as the lungs, nose, or throat. A respiratory infection that is caused by a virus is called a viral respiratory infection. Common types of viral respiratory infections include a cold, influenza, and respiratory syncytial virus (RSV) infection. Symptoms of this condition include a stuffy or runny nose, cough, fatigue, achy muscles, sore throat, and fevers or chills. Antibiotic medicines are not prescribed for viral infections. This is because antibiotics are designed to kill bacteria. They are not effective against viruses. This information is not intended to replace advice given to you by your health care provider. Make sure you discuss any questions you have with your health care provider. Document Revised: 09/09/2020 Document Reviewed: 09/09/2020 Elsevier Patient Education  2022 [employer].

## 2021-05-09 NOTE — ED Triage Notes (Signed)
Patient's family reported fever yesterday and cough with chest congestion .

## 2021-05-11 ENCOUNTER — Ambulatory Visit: Payer: Medicare Other

## 2021-05-18 ENCOUNTER — Other Ambulatory Visit: Payer: Self-pay | Admitting: Family Medicine

## 2021-05-31 ENCOUNTER — Other Ambulatory Visit: Payer: Self-pay

## 2021-05-31 ENCOUNTER — Ambulatory Visit (INDEPENDENT_AMBULATORY_CARE_PROVIDER_SITE_OTHER): Payer: Medicare Other

## 2021-05-31 DIAGNOSIS — Z23 Encounter for immunization: Secondary | ICD-10-CM | POA: Diagnosis not present

## 2021-06-08 ENCOUNTER — Other Ambulatory Visit (INDEPENDENT_AMBULATORY_CARE_PROVIDER_SITE_OTHER): Payer: Self-pay | Admitting: Family

## 2021-06-08 DIAGNOSIS — G40319 Generalized idiopathic epilepsy and epileptic syndromes, intractable, without status epilepticus: Secondary | ICD-10-CM

## 2021-06-08 MED ORDER — DIASTAT ACUDIAL 20 MG RE GEL: RECTAL | 5 refills | Status: DC

## 2021-06-08 MED ORDER — KEPPRA 100 MG/ML PO SOLN
ORAL | 5 refills | Status: DC
Start: 2021-06-08 — End: 2021-12-23

## 2021-06-08 MED ORDER — BANZEL 40 MG/ML PO SUSP: ORAL | 5 refills | Status: DC

## 2021-06-08 MED ORDER — LEVOCARNITINE 1 GM/10ML PO SOLN: ORAL | 5 refills | Status: DC

## 2021-06-08 MED ORDER — DEPAKOTE SPRINKLES 125 MG PO CSDR: DELAYED_RELEASE_CAPSULE | ORAL | 5 refills | Status: DC

## 2021-06-08 MED ORDER — TOPAMAX SPRINKLE 25 MG PO CPSP: ORAL_CAPSULE | ORAL | 5 refills | Status: DC

## 2021-06-10 ENCOUNTER — Ambulatory Visit (INDEPENDENT_AMBULATORY_CARE_PROVIDER_SITE_OTHER): Payer: Medicare Other | Admitting: Family

## 2021-06-23 ENCOUNTER — Other Ambulatory Visit: Payer: Self-pay | Admitting: Family Medicine

## 2021-07-11 ENCOUNTER — Telehealth (INDEPENDENT_AMBULATORY_CARE_PROVIDER_SITE_OTHER): Payer: Self-pay | Admitting: Family

## 2021-07-11 NOTE — Telephone Encounter (Signed)
°  Who's calling (name and relationship to patient) : [name]   Best contact number: [phone]  Provider they see: Elveria Rising   Reason for call: Mom stop by office and stated that Michele [name] has [employer] now and need prior authorization for all of her medications. Scanned  the new insurance card. Mom asked if [name] can call to confirm. Dr. Evaristo Bury Call [phone] Intake : [phone]     PRESCRIPTION REFILL ONLY  Name of prescription:  Pharmacy: [facility] [address] [phone]

## 2021-07-18 ENCOUNTER — Telehealth: Payer: Self-pay

## 2021-07-18 ENCOUNTER — Other Ambulatory Visit: Payer: Self-pay | Admitting: Family Medicine

## 2021-07-18 NOTE — Telephone Encounter (Signed)
Patient's mother brings paperwork to front desk stating that [employer] needs prior authorization. Submitted PA through covermymeds. Received the following determination.   Denied today The drug you asked for is not listed in your preferred drug list (formulary). The preferred drug(s), you may not have tried are: famotidine oral suspension. Your provider needs to give [place] medical reasons why the preferred drug(s) would not work for you and/or would have bad side effects. Sometimes a preferred drug needs more review for approval. Additionally, some preferred drugs listed may be the same drugs with different strengths or forms. [employer] may only require one strength or form of that drug to be tried. This decision was from [employer] Non-Formulary Exceptions Coverage Policy.  Please advise.   Key: [id number]  Veronda Prude, RN

## 2021-07-19 NOTE — Telephone Encounter (Signed)
A PA was entered into Covermymeds for brand Banzel, Keppra and Topamax Sprinkles and is pending at this time. TG

## 2021-07-19 NOTE — Telephone Encounter (Signed)
Spoke with mom and let her know the PA's were submitted and authorized. MOm states understanding and ended the call.

## 2021-07-19 NOTE — Telephone Encounter (Signed)
[employer] approved Michele [name]'s Banzel, Keppra and Topamax Sprinkles. Please let Mom know. Thanks, TG

## 2021-07-20 ENCOUNTER — Other Ambulatory Visit: Payer: Self-pay | Admitting: Family Medicine

## 2021-07-20 NOTE — Progress Notes (Signed)
° ° °  SUBJECTIVE:   Chief compliant/HPI: annual examination  Michele DRAWDY is a 36 y.o. who presents today for an annual exam.    Mom present at visit who is primary care giver. No concerns from mom. Mom reports Clarabel has actually lost some weight as she has been more active playing with her great grand nieces and nephews.   Review of systems: negative  Updated history tabs and problem list .   [place] Office Visit from 06/30/2020 in [place]  PHQ-9 Total Score 0        OBJECTIVE:   BP 110/60    Ht _0  (1.499 m)    Wt 206 lb 8 oz (93.7 kg)    LMP 07/08/2021    BMI 41.71 kg/m    General: Alert, no acute distress, playful, clapping hands, wheelchair bound Cardio: well perfused, S1 and S2 present, RRR Pulm: normal work of breathing, CTAB Abdomen: Bowel sounds normal. Abdomen soft and non-tender.  Extremities: chronic lymphedema bilaterally  Neuro: Cranial nerves grossly intact   ASSESSMENT/PLAN:   No problem-specific Assessment & Plan notes found for this encounter.   Annual Examination  See AVS for age appropriate recommendations.   PHQ score 0, reviewed and discussed. Blood pressure reviewed and at goal.  Asked about intimate partner violence and patient reports n/a.  The patient currently does not for contraception. Folate recommended as appropriate, minimum of 400 mcg per day.  Advanced directives n/a  Considered the following items based upon USPSTF recommendations: HIV testing: ordered Hepatitis C: discussed Hepatitis B: order Syphilis if at high risk: discussed not ordered  GC/CT not at high risk and not ordered. Lipid panel (nonfasting or fasting) discussed based upon AHA recommendations and ordered.  Consider repeat every 4-6 years.  Also ordered A1c for diabetic screening given BMI 41. Reviewed risk factors for latent tuberculosis and not indicated  Discussed family history, BRCA testing not indicated. Tool used to risk  stratify was Pedigree Assessment tool  Cervical cancer screening:  not indicated for pt. She has never been sexually active  , risks outweigh benefits in doing pap smear. This has been discussed on previous clinic visits with mom. Immunizations n/a   Follow up in 1  year or sooner if indicated.   Lattie Haw, MD [place]

## 2021-07-21 ENCOUNTER — Other Ambulatory Visit: Payer: Self-pay

## 2021-07-21 ENCOUNTER — Encounter: Payer: Self-pay | Admitting: Family Medicine

## 2021-07-21 ENCOUNTER — Ambulatory Visit (INDEPENDENT_AMBULATORY_CARE_PROVIDER_SITE_OTHER): Payer: Medicare Other | Admitting: Family Medicine

## 2021-07-21 VITALS — BP 110/60 | Ht 59.0 in | Wt 206.5 lb

## 2021-07-21 DIAGNOSIS — Z131 Encounter for screening for diabetes mellitus: Secondary | ICD-10-CM

## 2021-07-21 DIAGNOSIS — Z114 Encounter for screening for human immunodeficiency virus [HIV]: Secondary | ICD-10-CM

## 2021-07-21 DIAGNOSIS — Z0000 Encounter for general adult medical examination without abnormal findings: Secondary | ICD-10-CM | POA: Diagnosis not present

## 2021-07-21 DIAGNOSIS — Z00121 Encounter for routine child health examination with abnormal findings: Secondary | ICD-10-CM

## 2021-07-21 DIAGNOSIS — R7309 Other abnormal glucose: Secondary | ICD-10-CM

## 2021-07-21 DIAGNOSIS — Z135 Encounter for screening for eye and ear disorders: Secondary | ICD-10-CM

## 2021-07-21 DIAGNOSIS — Z13220 Encounter for screening for lipoid disorders: Secondary | ICD-10-CM

## 2021-07-21 DIAGNOSIS — Z1159 Encounter for screening for other viral diseases: Secondary | ICD-10-CM

## 2021-07-21 NOTE — Patient Instructions (Signed)
Thank you for coming to see me today. It was a pleasure. Normal physical   We will get some labs today.  If they are abnormal or we need to do something about them, I will call you.  If they are normal, I will send you a message on MyChart (if it is active) or a letter in the mail.  If you don't hear from [place] in 2 weeks, please call the office at the number below.   Please follow-up with me as needed  If you have any questions or concerns, please do not hesitate to call the office at [phone].  Best wishes,   Dr Allena Katz

## 2021-07-25 DIAGNOSIS — Z20822 Contact with and (suspected) exposure to covid-19: Secondary | ICD-10-CM | POA: Diagnosis not present

## 2021-07-29 ENCOUNTER — Other Ambulatory Visit (HOSPITAL_COMMUNITY): Payer: Self-pay

## 2021-07-29 LAB — LIPID PANEL
Chol/HDL Ratio: 3.5 ratio (ref 0.0–4.4)
Cholesterol, Total: 179 mg/dL (ref 100–199)
HDL: 51 mg/dL (ref >39)
LDL Chol Calc (NIH): 100 mg/dL — ABNORMAL HIGH (ref 0–99)
Triglycerides: 164 mg/dL — ABNORMAL HIGH (ref 0–149)
VLDL Cholesterol Cal: 28 mg/dL (ref 5–40)

## 2021-07-29 LAB — HIV ANTIBODY (ROUTINE TESTING W REFLEX): HIV Screen 4th Generation wRfx: NONREACTIVE

## 2021-07-29 LAB — HEPATITIS B SURFACE ANTIBODY,QUALITATIVE

## 2021-07-29 NOTE — Telephone Encounter (Signed)
Michele [name]! Any idea how we can get her her protinix? Her insurance has recently changed. Thanks

## 2021-07-29 NOTE — Telephone Encounter (Signed)
It looks like we can submit an appeal through cover my meds, however, will need the following information.   Appeal Are you requesting an URGENT review? Will not receiving this drug seriously jeopardize the enrollee's life, health, or ability to regain maximum function?  YES, it will jeopardize, so I am requesting an EXPEDITED appeal  NO, it will not jeopardize, so I am requesting a STANDARD appeal Reason for appealing the denied drug Review your plan's reasons for denial in their determination letter. Please utilize that information to provide specific, detailed clinical information/rationale of your patient's health status to address their denial reasons.  Please advise.   Veronda Prude, RN

## 2021-08-01 ENCOUNTER — Other Ambulatory Visit: Payer: Self-pay | Admitting: *Deleted

## 2021-08-01 DIAGNOSIS — K219 Gastro-esophageal reflux disease without esophagitis: Secondary | ICD-10-CM

## 2021-08-02 ENCOUNTER — Other Ambulatory Visit: Payer: Self-pay

## 2021-08-02 ENCOUNTER — Encounter (INDEPENDENT_AMBULATORY_CARE_PROVIDER_SITE_OTHER): Payer: Self-pay | Admitting: Family

## 2021-08-02 ENCOUNTER — Ambulatory Visit (INDEPENDENT_AMBULATORY_CARE_PROVIDER_SITE_OTHER): Payer: Medicare Other | Admitting: Family

## 2021-08-02 VITALS — BP 116/64

## 2021-08-02 DIAGNOSIS — G40319 Generalized idiopathic epilepsy and epileptic syndromes, intractable, without status epilepticus: Secondary | ICD-10-CM | POA: Diagnosis not present

## 2021-08-02 DIAGNOSIS — G801 Spastic diplegic cerebral palsy: Secondary | ICD-10-CM

## 2021-08-02 DIAGNOSIS — F73 Profound intellectual disabilities: Secondary | ICD-10-CM | POA: Diagnosis not present

## 2021-08-02 DIAGNOSIS — G40814 Lennox-Gastaut syndrome, intractable, without status epilepticus: Secondary | ICD-10-CM | POA: Diagnosis not present

## 2021-08-02 MED ORDER — RUFINAMIDE 40 MG/ML PO SUSP: ORAL | 5 refills | Status: DC

## 2021-08-02 NOTE — Progress Notes (Signed)
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 She is taking and tolerating brand Depakote, Keppra, Topamax and Banzel for her seizure disorder. Jadea typically turns blue and requires blow by oxygen when seizures occur. She is completely dependent upon others for all activities of daily living. She is cared for at home by her mother.  Today's concerns: Mom reports today that [facility] has remained seizure free since her last visit. Her insurance has changed and will no longer cover brand Banzel. I talked with Mom about that and explained that I will send in the generic formulation.   Camyla wears elastic stockings for lymphedema and Mom works with her to position and exercise her legs. She has been otherwise generally healthy since she was last seen. Mom has no other health concerns for [name]  today other than previously mentioned.  Review of systems: Please see HPI for neurologic and other pertinent review of systems. Otherwise all other systems were reviewed and were negative.  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    URINARY INCONTINENCE 02/25/2010   Full incontinence of feces 02/25/2010   Hypothyroidism 01/29/2009   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     Family history: family history includes Cancer in her maternal aunt; Diabetes in an other family member; Heart disease in her maternal grandmother and another family member; Hypertension in her mother and paternal grandmother; Kidney disease in an other family member; Stroke in her maternal grandfather.   Social history: Social History   Socioeconomic History   Marital status: Single    Spouse name: Not on file   Number of children: 0   Years of education: Not on file   Highest education level: Not on file  Occupational History   Occupation: Disabled   Tobacco Use   Smoking status: Never    Passive exposure: Never   Smokeless tobacco: Never  Vaping Use   Vaping Use: Never used  Substance and Sexual Activity   Alcohol use: No   Drug use: No   Sexual  activity: Never  Other Topics Concern   Not on file  Social History Narrative   0 caffeine drinks    Lives with mother who helps ADLs.   Graduated from [employer] in 2010. Now attends day program at [employer].    Enjoys playing in water, ripping paper, watching TV, music and going out.   Social Determinants of Health   Financial Resource Strain: Not on file  Food Insecurity: Not on file  Transportation Needs: Not on file   Physical Activity: Not on file  Stress: Not on file  Social Connections: Not on file  Intimate Partner Violence: Not on file    Past/failed meds: Copied from previous record: Carbamazepine - rash  Allergies: Allergies  Allergen Reactions   Carbamazepine Rash    Immunizations: Immunization History  Administered Date(s) Administered   Influenza Split 04/14/2011, 03/22/2012   Influenza Whole 05/26/2009, 04/04/2010   Influenza,inj,Quad PF,6+ Mos 04/03/2013, 04/04/2014, 03/08/2015, 03/08/2016, 03/26/2017, 04/12/2018, 03/03/2019, 03/26/2020, 02/18/2021   PFIZER(Purple Top)SARS-COV-2 Vaccination 11/10/2019, 12/01/2019, 05/28/2020   Pfizer Covid-19 Vaccine Bivalent Booster 52yrs & up 05/31/2021   Tdap 01/14/2014    Diagnostics/Screenings:  Physical Exam: BP 116/64    LMP 07/08/2021   General: well developed, well nourished obese woman, seated in wheelchair, in no evident distress Head: normocephalic and atraumatic. Oropharynx benign. No dysmorphic features. Neck: supple Cardiovascular: regular rate and rhythm, no murmurs. Respiratory: clear to auscultation bilaterally Abdomen: bowel sounds present all four quadrants, abdomen soft, non-tender, non-distended.  Musculoskeletal: no skeletal deformities or obvious scoliosis. Has increased tone and lymphedema in her legs Skin: no rashes or neurocutaneous lesions  Neurologic Exam Mental Status: awake and fully alert. Has no language.  Smiles responsively. Claps her hands frequently. Tolerant of invasions in to her space Cranial Nerves: fundoscopic exam - red reflex present.  Unable to fully visualize fundus.  Pupils equal briskly reactive to light.  Turns to localize faces and objects in the periphery. Turns to localize sounds in the periphery. Facial movements are symmetric, has lower facial weakness with drooling.  Motor: normal bulk, tone and strength in the upper extremities, increased tone in the lower extremities Sensory: withdrawal  x 4 Coordination: unable to adequately assess due to patient's inability to participate in examination. No dysmetria when reaching for objects. Gait and Station: unable to stand and bear weight.  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 Vennessa has neither had nor required imaging or lab studies since the last visit. She is a 36 year old woman with history of spastic diplegia, intellectual disability and Lennox Gastaut encephalopathy. She is taking and tolerating brand Depakote, Keppra and Topamax for her seizure disorder. She was taking brand Banzel but her insurance will not cover it and therefore will be changed to generic Rufinamide. I asked Mom to let me know if Johna has seizures or other side effects from the change to generic. I will otherwise see Sherin back in follow up in 6 months or sooner if needed.   The medication list was reviewed and reconciled. I reviewed changes that were made in the prescribed medications today. A complete medication list was provided to the patient.  Return in about  6 months (around 01/30/2022).   Allergies as of 08/02/2021       Reactions   Carbamazepine Rash        Medication List        Accurate as of August 02, 2021 10:17 AM. If you have any questions, ask your nurse or doctor.          acetaminophen 650 MG suppository Commonly known as: TYLENOL Place 650 mg rectally every 8 (eight) hours as needed for mild pain.   acetaminophen 500 MG tablet Commonly known as: TYLENOL Take 500 mg by mouth every 6 (six) hours as needed for  fever.   albuterol (2.5 MG/3ML) 0.083% nebulizer solution Commonly known as: PROVENTIL Take 3 mLs (2.5 mg total) by nebulization every 6 (six) hours as needed for wheezing or shortness of breath.   Calcarb 600/D 600-10 MG-MCG Tabs Generic drug: Calcium Carbonate-Vitamin D Take 1 tablet by mouth once a week.   cetirizine HCl 1 MG/ML solution Commonly known as: ZYRTEC TAKE 10 ML(10 MG) BY MOUTH DAILY   Depakote Sprinkles 125 MG capsule Generic drug: divalproex TAKE 8 CAPSULES BY MOUTH TWICE DAILY   Diastat AcuDial 20 MG Gel Generic drug: diazepam DIAL TO 15 MG, GIVE RECTALLY FOR SEIZURES LASTING 2 MINUTES OR LONGER   feeding supplement Liqd Take 237 mLs by mouth 3 (three) times daily between meals.   fluticasone 50 MCG/ACT nasal spray Commonly known as: FLONASE SHAKE LIQUID AND USE 2 SPRAYS IN EACH NOSTRIL TWICE DAILY   furosemide 20 MG tablet Commonly known as: LASIX TAKE 1 TABLET BY MOUTH EVERY DAY AS NEEDED FOR SWELLING   Keppra 100 MG/ML solution Generic drug: levETIRAcetam TAKE 12 MLS BY MOUTH EVERY MORNING AND 12 MLS AT NIGHT   lactulose 10 GM/15ML solution Commonly known as: CHRONULAC TAKE 30 MLS BY MOUTH 2 TIMES DAILY.   levOCARNitine 1 GM/10ML solution Commonly known as: CARNITOR TAKE 5 MLS BY MOUTH THREE TIMES DAILY   liver oil-zinc oxide 40 % ointment Commonly known as: DESITIN Apply 1 application topically daily.   nystatin powder Commonly known as: nystatin APPLY TO THE AFFECTED AREA WITH EACH DIAPER CHANGE UNTIL RASH IS GONE   pantoprazole sodium 40 mg Commonly known as: PROTONIX MIX 1 PACK AND TAKE DAILY AS DIRECTED   Rufinamide 40 MG/ML Susp Commonly known as: Banzel TAKE EVERY MORNING AND EVERY NIGHT AT BEDTIME   Topamax Sprinkle 25 MG capsule Generic drug: topiramate TAKE 6 CAPSULES BY MOUTH EVERY MORNING AND 6 CAPSULES IN THE EVENING WITH FOOD      Total time spent with the patient was 20 minutes, of which 50% or more was  spent in counseling and coordination of care.  Elveria Rising NP-C [facility] Ph. [phone] Fax [phone]

## 2021-08-02 NOTE — Patient Instructions (Signed)
It was a pleasure to see you today!  Instructions for you until your next appointment are as follows: We will change Michele [name]'s Banzel to generic Rufinamide. Let me know if she has any seizures or any other side effects after she takes the generic medication.  Please sign up for MyChart if you have not done so. Please plan to return for follow up in 6 months or sooner if needed.    Feel free to contact our office during normal business hours at [phone] with questions or concerns. If there is no answer or the call is outside business hours, please leave a message and our clinic staff will call you back within the next business day.  If you have an urgent concern, please stay on the line for our after-hours answering service and ask for the on-call neurologist.     I also encourage you to use MyChart to communicate with me more directly. If you have not yet signed up for MyChart within [facility], the front desk staff can help you. However, please note that this inbox is NOT monitored on nights or weekends, and response can take up to 2 business days.  Urgent matters should be discussed with the on-call pediatric neurologist.   At [facility], we are committed to providing exceptional care. You will receive a patient satisfaction survey through text or email regarding your visit today. Your opinion is important to me. Comments are appreciated.

## 2021-08-05 MED ORDER — PANTOPRAZOLE SODIUM 40 MG PO PACK: 40.0000 mg | PACK | Freq: Every day | ORAL | 3 refills | Status: DC

## 2021-09-16 DIAGNOSIS — Z20822 Contact with and (suspected) exposure to covid-19: Secondary | ICD-10-CM | POA: Diagnosis not present

## 2021-09-19 ENCOUNTER — Other Ambulatory Visit (INDEPENDENT_AMBULATORY_CARE_PROVIDER_SITE_OTHER): Payer: Self-pay | Admitting: Family

## 2021-09-19 DIAGNOSIS — G40319 Generalized idiopathic epilepsy and epileptic syndromes, intractable, without status epilepticus: Secondary | ICD-10-CM

## 2021-09-19 MED ORDER — DIAZEPAM 20 MG RE GEL: RECTAL | 5 refills | Status: DC

## 2021-09-29 DIAGNOSIS — Z20822 Contact with and (suspected) exposure to covid-19: Secondary | ICD-10-CM | POA: Diagnosis not present

## 2021-09-30 ENCOUNTER — Telehealth (INDEPENDENT_AMBULATORY_CARE_PROVIDER_SITE_OTHER): Payer: Self-pay | Admitting: Family

## 2021-09-30 DIAGNOSIS — G40319 Generalized idiopathic epilepsy and epileptic syndromes, intractable, without status epilepticus: Secondary | ICD-10-CM

## 2021-09-30 MED ORDER — BANZEL 40 MG/ML PO SUSP: ORAL | 5 refills | Status: DC

## 2021-09-30 NOTE — Addendum Note (Signed)
Addended by: Princella Ion on: 09/30/2021 03:34 PM ? ? Modules accepted: Orders ? ?

## 2021-09-30 NOTE — Telephone Encounter (Signed)
I called and spoke to Mom. She said that Michele [name] was having increasing number of seizures since changing from brand Banzel to generic Rufinamide. I told Mom that I will try to get the brand covered again. I sent the Rx to the pharmacy. Mom agreed with this plan. TG ?

## 2021-09-30 NOTE — Telephone Encounter (Signed)
The PA request has been submitted. TG ?

## 2021-09-30 NOTE — Telephone Encounter (Signed)
Attempted to call mo to get more information regarding her daughters seizures. No answer. No confirmed vm ?

## 2021-09-30 NOTE — Telephone Encounter (Signed)
?  Name of who is calling: ? ?Caller's Relationship to Patient: ? ?Best contact number: ? ?Provider they see: ? ?Reason for call: The insurance will not pay for the brand name  for the banzel unless there is PA in place ? ? ? ? ?PRESCRIPTION REFILL ONLY ? ?Name of prescription: ? ?Pharmacy: ? ? ?

## 2021-09-30 NOTE — Telephone Encounter (Signed)
?  Name of who is calling: ?[name] ?Caller's Relationship to Patient: ?Mom ?Best contact number: ?[phone] ?Provider they see: ?Elveria Rising ?Reason for call: ? ?Mom is calling stating that since the change in medication patient is having more seizures please have tina contact ASAP ? ? ?PRESCRIPTION REFILL ONLY ? ?Name of prescription: ? ?Pharmacy: ? ? ?

## 2021-10-03 NOTE — Telephone Encounter (Signed)
The insurance approved brand Banzel. I faxed the pharmacy to let them know. TG ?

## 2021-11-08 ENCOUNTER — Ambulatory Visit
Admission: RE | Admit: 2021-11-08 | Discharge: 2021-11-08 | Disposition: A | Discharge status: Home / Self Care | Payer: Medicare Other | Source: Ambulatory Visit | Attending: Family Medicine | Admitting: Family Medicine

## 2021-11-08 ENCOUNTER — Other Ambulatory Visit: Payer: Self-pay | Admitting: Family Medicine

## 2021-11-08 ENCOUNTER — Ambulatory Visit (INDEPENDENT_AMBULATORY_CARE_PROVIDER_SITE_OTHER): Payer: Medicare Other | Admitting: Family Medicine

## 2021-11-08 ENCOUNTER — Telehealth: Payer: Self-pay | Admitting: Family Medicine

## 2021-11-08 VITALS — BP 100/70 | HR 81

## 2021-11-08 DIAGNOSIS — R059 Cough, unspecified: Secondary | ICD-10-CM | POA: Diagnosis not present

## 2021-11-08 DIAGNOSIS — J811 Chronic pulmonary edema: Secondary | ICD-10-CM | POA: Diagnosis not present

## 2021-11-08 DIAGNOSIS — R051 Acute cough: Secondary | ICD-10-CM

## 2021-11-08 NOTE — Patient Instructions (Addendum)
It was great to meet you!  Basia's symptoms are most likely from a virus (common cold).  However, we will get a chest x-ray to make sure there is no pneumonia.  You can go to [facility] when you leave here to have this done. No appointment needed. They have 2 locations-[address] and [facility] [address]. I will call you with the results.  In the meantime, continue using Tylenol suppository and Motrin as needed for fever. You can also continue over-the-counter cough medications and breathing treatments as needed.  Take care, Dr. Anner Crete

## 2021-11-08 NOTE — Progress Notes (Signed)
    SUBJECTIVE:   CHIEF COMPLAINT / HPI:   Cough Symptoms started 2 days ago Nonproductive Did not sleep well last night and hasn't been as energetic as usual No fever. Did have temp of 99.8 at home yesterday so Mom gave Tylenol suppository and Motrin (Mom reports low grade fever such as 99 can trigger her seizures). Taking Tussin cough medication which is slightly helpful Also used an albuterol neb which helped briefly Possible sick contact- 35-year-old family member came to visit over the weekend and was sneezing a lot (they thought it was allergies at the time) No increased work of breathing O2 sats have been in the high 80s at home which is typical for her She has oxygen at home if her SpO2 drops into the 70s, but has not needed this at all recently She is eating/drinking at baseline and urinating same amount  PERTINENT  PMH / PSH: cerebral palsy, congenital quadriplegia, Lennox-Gastaut syndrome, seizure disorder, incontinence  OBJECTIVE:   BP 100/70   Pulse 81   LMP 10/19/2021   SpO2 (!) 87%   Gen: alert, NAD HEENT: normal sclera/conjunctiva, oropharynx without erythema or edema, nares patent bilaterally, TM normal bilaterally Neck: no cervical lymphadenopathy CV: RRR, normal S1/S2 without m/r/g Resp: normal work of breathing on room air, lungs CTAB  ASSESSMENT/PLAN:   Acute cough Suspect viral URI as symptoms have only been present 2-3 days, she is well appearing on exam with clear lungs and no red flags. However, will obtain CXR given her complex PMHx and prior history of pneumonia. Continue supportive care in the meantime. ED/return precautions reviewed with Mom.     Alcus Dad, MD [place]

## 2021-11-08 NOTE — Telephone Encounter (Signed)
Called Mom and informed of normal CXR results. She was appreciative.

## 2021-11-08 NOTE — Assessment & Plan Note (Signed)
Suspect viral URI as symptoms have only been present 2-3 days, she is well appearing on exam with clear lungs and no red flags. However, will obtain CXR given her complex PMHx and prior history of pneumonia. Continue supportive care in the meantime. ED/return precautions reviewed with Mom.

## 2021-11-21 ENCOUNTER — Other Ambulatory Visit: Payer: Self-pay | Admitting: *Deleted

## 2021-11-21 DIAGNOSIS — K219 Gastro-esophageal reflux disease without esophagitis: Secondary | ICD-10-CM

## 2021-11-21 MED ORDER — PANTOPRAZOLE SODIUM 40 MG PO PACK: 40.0000 mg | PACK | Freq: Every day | ORAL | 0 refills | Status: DC

## 2021-12-05 ENCOUNTER — Telehealth (INDEPENDENT_AMBULATORY_CARE_PROVIDER_SITE_OTHER): Payer: Self-pay | Admitting: Family

## 2021-12-05 DIAGNOSIS — G40319 Generalized idiopathic epilepsy and epileptic syndromes, intractable, without status epilepticus: Secondary | ICD-10-CM

## 2021-12-05 MED ORDER — DEPAKOTE SPRINKLES 125 MG PO CSDR: DELAYED_RELEASE_CAPSULE | ORAL | 5 refills | Status: DC

## 2021-12-05 NOTE — Telephone Encounter (Signed)
  Name of who is calling:[name]   Caller's Relationship to Patient:Mother   Best contact number:[phone]  Provider they ZJI:RCVE Goodpasture   Reason for call:Medication refill. Mom stated that the pharmacy asked her to call the office for refills      PRESCRIPTION REFILL ONLY  Name of prescription:Depakote   Pharmacy:[facility] [address]

## 2021-12-05 NOTE — Telephone Encounter (Signed)
Patient needs refill of Depakote sprinkles 125mg  Last visit 08/02/2021 Last filled 06/08/2021 with 5 refills 6 month follow up is scheduled for Aug 02/02/2022

## 2021-12-05 NOTE — Telephone Encounter (Signed)
Rx faxed as requested. TG

## 2021-12-18 ENCOUNTER — Other Ambulatory Visit: Payer: Self-pay | Admitting: Family Medicine

## 2021-12-18 DIAGNOSIS — K219 Gastro-esophageal reflux disease without esophagitis: Secondary | ICD-10-CM

## 2021-12-20 ENCOUNTER — Other Ambulatory Visit: Payer: Self-pay | Admitting: Family Medicine

## 2021-12-20 DIAGNOSIS — K219 Gastro-esophageal reflux disease without esophagitis: Secondary | ICD-10-CM

## 2021-12-23 ENCOUNTER — Telehealth (INDEPENDENT_AMBULATORY_CARE_PROVIDER_SITE_OTHER): Payer: Self-pay | Admitting: Family

## 2021-12-23 DIAGNOSIS — G40319 Generalized idiopathic epilepsy and epileptic syndromes, intractable, without status epilepticus: Secondary | ICD-10-CM

## 2021-12-23 MED ORDER — KEPPRA 100 MG/ML PO SOLN: ORAL | 5 refills | Status: DC

## 2021-12-23 MED ORDER — BANZEL 40 MG/ML PO SUSP: ORAL | 5 refills | Status: DC

## 2021-12-23 MED ORDER — TOPAMAX SPRINKLE 25 MG PO CPSP: ORAL_CAPSULE | ORAL | 5 refills | Status: DC

## 2021-12-23 MED ORDER — DEPAKOTE SPRINKLES 125 MG PO CSDR
DELAYED_RELEASE_CAPSULE | ORAL | 5 refills | Status: DC
Start: 2021-12-23 — End: 2022-08-24

## 2021-12-23 MED ORDER — LEVOCARNITINE 1 GM/10ML PO SOLN
ORAL | 5 refills | Status: DC
Start: 2021-12-23 — End: 2022-06-05

## 2021-12-23 NOTE — Telephone Encounter (Signed)
Please let Mom know that I sent in refills as requested. Thanks, [name]

## 2021-12-23 NOTE — Telephone Encounter (Signed)
Who's calling (name and relationship to patient) : Vivien Presto; mom  Best contact number: [phone]  Provider they see: Goodpasture  Reason for call: Mom has called in for a refill/new on prescription Keppra, topamax, banzel. She stated that she is at the pharmacy and its not there.  Call ID:      PRESCRIPTION REFILL ONLY  Name of prescription:  Pharmacy:

## 2021-12-27 ENCOUNTER — Other Ambulatory Visit: Payer: Self-pay | Admitting: Family Medicine

## 2022-01-09 ENCOUNTER — Telehealth (INDEPENDENT_AMBULATORY_CARE_PROVIDER_SITE_OTHER): Payer: Self-pay | Admitting: Family

## 2022-01-09 NOTE — Telephone Encounter (Signed)
[employer] approved brand Depakote through 06/18/22 on 07/22/2021. A prescription was faxed to the pharmacy with Brand Medically Necessary on 12/23/2021.  Sarah, please call the pharmacy to see why they aren't filling it as brand. Thanks, [name]

## 2022-01-09 NOTE — Telephone Encounter (Signed)
Call to [facility]- they report it is too soon for her to pick up a new rx  she just picked it up 7/8 496 capsules brand name. She cannot pick it up again until 01/12/22.   Call to mom Michele [name]- She reports they told her the insurance wants her to use a different medication. RN advised that her insurance approved the medication through 07/2022. The insurance does try to get providers to change to a different medication but the provider gets it approved and they do not have to switch. Mom reports she has plenty of the med not sure why they said she was trying to get it refilled she just didn't understand why they were saying she had to change.

## 2022-01-09 NOTE — Telephone Encounter (Signed)
  Name of who is calling:[name]   Caller's Relationship to Patient:Mother   Best contact number:[phone]   Provider they ZOX:WRUE Goodpasture   Reason for call:Mom came the office to speak with [name] about the pharmacy needing a letter stating that the Depakote needs to be name brand and not generic. Insurance is through [place].      PRESCRIPTION REFILL ONLY  Name of prescription:DEPAKOTE 125 MG sprinkles   Pharmacy:[facility] on [address], [place], [place]

## 2022-01-25 ENCOUNTER — Telehealth: Payer: Self-pay | Admitting: Family Medicine

## 2022-01-25 DIAGNOSIS — G808 Other cerebral palsy: Secondary | ICD-10-CM

## 2022-01-25 DIAGNOSIS — G40814 Lennox-Gastaut syndrome, intractable, without status epilepticus: Secondary | ICD-10-CM

## 2022-01-25 DIAGNOSIS — I890 Lymphedema, not elsewhere classified: Secondary | ICD-10-CM

## 2022-01-25 DIAGNOSIS — G800 Spastic quadriplegic cerebral palsy: Secondary | ICD-10-CM

## 2022-01-25 NOTE — Telephone Encounter (Signed)
Patient's mother came in stating that she needs to get a referral for [facility] again.

## 2022-01-26 NOTE — Telephone Encounter (Signed)
Referral sent in again to [facility] per request.  Fayette Pho, MD

## 2022-02-02 ENCOUNTER — Encounter (INDEPENDENT_AMBULATORY_CARE_PROVIDER_SITE_OTHER): Payer: Self-pay | Admitting: Family

## 2022-02-02 ENCOUNTER — Ambulatory Visit (INDEPENDENT_AMBULATORY_CARE_PROVIDER_SITE_OTHER): Payer: Medicare Other | Admitting: Family

## 2022-02-02 VITALS — BP 114/72 | HR 88 | Wt 216.8 lb

## 2022-02-02 DIAGNOSIS — F73 Profound intellectual disabilities: Secondary | ICD-10-CM | POA: Diagnosis not present

## 2022-02-02 DIAGNOSIS — G801 Spastic diplegic cerebral palsy: Secondary | ICD-10-CM

## 2022-02-02 DIAGNOSIS — Z6841 Body Mass Index (BMI) 40.0 and over, adult: Secondary | ICD-10-CM | POA: Diagnosis not present

## 2022-02-02 DIAGNOSIS — G40319 Generalized idiopathic epilepsy and epileptic syndromes, intractable, without status epilepticus: Secondary | ICD-10-CM | POA: Diagnosis not present

## 2022-02-02 DIAGNOSIS — I890 Lymphedema, not elsewhere classified: Secondary | ICD-10-CM

## 2022-02-02 DIAGNOSIS — G40812 Lennox-Gastaut syndrome, not intractable, without status epilepticus: Secondary | ICD-10-CM

## 2022-02-02 MED ORDER — VALTOCO 20 MG DOSE 10 MG/0.1ML NA LQPK: NASAL | 5 refills | Status: DC

## 2022-02-02 NOTE — Progress Notes (Signed)
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 She is taking and tolerating brand Depakote, Keppra, Topamax and Banzel for her seizure disorder. Juletta typically turns blue and requires blow by oxygen when seizures occur. She is completely dependent upon others for all activities of daily living. She is cared for at home by her mother.  Due to her medical condition, she is indefinitely incontinent of stool and urine.  It is medically necessary for her to use diapers, underpads, and gloves to assist with hygiene and skin integrity.     Today's concerns: Mom reports today that Michele [name] has had occasional brief seizure but has not lengthy seizures or required rescue treatment with Diastat.  She has been doing well with no illnesses or ER visits. She has been otherwise generally healthy since she was last seen. Mom has no other health concerns for Michele [name] today other than previously mentioned.  Review of systems: Please see HPI for neurologic and other pertinent review of systems. Otherwise all other systems were reviewed and were negative.  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) 12/24/2012   Morbid obesity (HCC) 12/24/2012   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   URINARY INCONTINENCE 02/25/2010   Full incontinence of feces 02/25/2010   Hypothyroidism 01/29/2009   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   Cerebral palsy (HCC) 01/29/2009   Osteoporosis 01/29/2009   Seizure disorder (HCC) 01/29/2009     Past Medical History:  Diagnosis Date   Cerebral palsy (HCC)    Hypothyroidism    Lennox-Gastaut syndrome (HCC)    Osteoporosis    Pneumonia    Profound mental retardation    Reflux    Rickets    Seizures (HCC)     Past medical history comments: See HPI  Surgical history: Past Surgical History:  Procedure Laterality Date   FOOT MASS EXCISION Right    Gastrocutaneous fistual repair  2012   PEG TUBE PLACEMENT     PEG TUBE REMOVAL  2012 ?     Family history: family history includes Cancer in her maternal aunt; Diabetes in an other family member; Heart disease in her maternal grandmother and another family member; Hypertension in her mother and paternal grandmother; Kidney disease in an other family member; Stroke in her maternal grandfather.   Social history: Social History   Socioeconomic History   Marital status: Single    Spouse name: Not on file   Number of children: 0   Years of education: Not on file   Highest education level: Not on file  Occupational History   Occupation: Disabled   Tobacco Use   Smoking status: Never    Passive exposure: Never   Smokeless tobacco: Never  Vaping Use   Vaping Use:  Never used  Substance and Sexual Activity   Alcohol use: No   Drug use: No   Sexual activity: Never  Other Topics Concern   Not on file  Social History Narrative   0 caffeine drinks    Lives with mother who helps ADLs.   Graduated from [employer] in 2010. Now attends day program at [employer].    Enjoys playing in water, ripping paper, watching TV, music and going out.   Social Determinants of  Health   Financial Resource Strain: Not on file  Food Insecurity: Not on file  Transportation Needs: Not on file  Physical Activity: Not on file  Stress: Not on file  Social Connections: Not on file  Intimate Partner Violence: Not on file    Past/failed meds: Copied from previous record: Carbamazepine - rash  Allergies: Allergies  Allergen Reactions   Carbamazepine Rash    Immunizations: Immunization History  Administered Date(s) Administered   Influenza Split 04/14/2011, 03/22/2012   Influenza Whole 05/26/2009, 04/04/2010   Influenza,inj,Quad PF,6+ Mos 04/03/2013, 04/04/2014, 03/08/2015, 03/08/2016, 03/26/2017, 04/12/2018, 03/03/2019, 03/26/2020, 02/18/2021   PFIZER(Purple Top)SARS-COV-2 Vaccination 11/10/2019, 12/01/2019, 05/28/2020   Pfizer Covid-19 Vaccine Bivalent Booster 93yrs & up 05/31/2021   Tdap 01/14/2014    Diagnostics/Screenings:  Physical Exam: BP 114/72 (BP Location: Right Wrist, Patient Position: Sitting)   Pulse 88   Wt 216 lb 12.8 oz (98.3 kg) Comment: 263.8 (total weight) minus 47 (wheelchair weight)=  BMI 43.79 kg/m   General: well developed, well nourished obese woman, seated in a wheelchair, in no evident distress Head: microcephalic and atraumatic. Oropharynx benign. No dysmorphic features. Neck: supple Cardiovascular: regular rate and rhythm, no murmurs. Respiratory: clear to auscultation bilaterally Abdomen: bowel sounds present all four quadrants, abdomen soft, non-tender, non-distended.  Musculoskeletal: no skeletal deformities or obvious scoliosis. Has increased tone and lymphedema in her legs. Skin: no rashes or neurocutaneous lesions  Neurologic Exam Mental Status: awake and fully alert. Has no language.  Smiles responsively. Claps her hands frequently. Tolerant of invasions into her space Cranial Nerves: fundoscopic exam - red reflex present.  Unable to fully visualize fundus.  Pupils equal briskly reactive to light.  Turns to localize  faces and objects in the periphery. Turns to localize sounds in the periphery. Facial movements are symmetric. Motor: normal functional bulk, tone and strength in the upper extremities, increased tone in the lower extremities Sensory: withdrawal x 4 Coordination: unable to adequately assess due to patient's inability to participate in examination. No dysmetria when reaching for objects. Gait and Station: unable to stand and bear weight. .  Reflexes: diminished and symmetric. Toes neutral. 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 Gelila has neither had nor required imaging or lab studies since the last visit. She continues to have brief intermittent seizures. She has Diastat for seizures that last longer, but I talked with her Mom today about switching to Valtoco nasal spray as it is difficult to get Naylene out of her wheelchair when seizures occur. I demonstrated how to administer the Valtoco and asked Mom to let me know how it works for her. I will otherwise see Michele [name] back in follow up in  6 months or sooner if needed. Mom agreed with theses plans.   The medication list was reviewed and reconciled. I reviewed the changes that were made in the prescribed medications today. A complete medication list was provided to the patient.  Return in about 6 months (around 08/05/2022).   Allergies as of 02/02/2022       Reactions   Carbamazepine Rash        Medication List        Accurate as of February 02, 2022 11:59 PM. If you have any questions, ask your nurse or doctor.          acetaminophen 650 MG  suppository Commonly known as: TYLENOL Place 650 mg rectally every 8 (eight) hours as needed for mild pain.   acetaminophen 500 MG tablet Commonly known as: TYLENOL Take 500 mg by mouth every 6 (six) hours as needed for fever.   albuterol (2.5 MG/3ML) 0.083% nebulizer solution Commonly known as: PROVENTIL Take 3 mLs (2.5 mg total) by nebulization every 6 (six) hours as needed for wheezing or shortness of breath.   Banzel 40 MG/ML Susp Generic drug: Rufinamide TAKE EVERY MORNING AND EVERY NIGHT AT BEDTIME   Calcarb 600/D 600-10 MG-MCG Tabs Generic drug: Calcium Carbonate-Vitamin D Take 1 tablet by mouth once a week.   cetirizine HCl 1 MG/ML solution Commonly known as: ZYRTEC TAKE 10 ML(10 MG) BY MOUTH DAILY   Depakote Sprinkles 125 MG capsule Generic drug: divalproex TAKE 8 CAPSULES BY MOUTH TWICE DAILY   diazepam 20 MG Gel Commonly known as: Diastat AcuDial DIAL TO 15 MG, GIVE RECTALLY FOR SEIZURES LASTING 2 MINUTES OR LONGER What changed: Another medication with the same name was added. Make sure you understand how and when to take each. Changed by: Elveria Rising, NP   Valtoco 20 MG Dose 2 x 10 MG/0.1ML Lqpk Generic drug: diazePAM (20 MG Dose) Give 1 spray in each nostril for seizures lasting 2 minutes or longer. What changed: You were already taking a medication with the same name, and this prescription was added. Make sure you understand how and when to take each. Changed by: Elveria Rising, NP   feeding supplement Liqd Take 237 mLs by mouth 3 (three) times daily between meals.   fluticasone 50 MCG/ACT nasal spray Commonly known as: FLONASE SHAKE LIQUID AND USE 2 SPRAYS IN EACH NOSTRIL TWICE DAILY   furosemide 20 MG tablet Commonly known as: LASIX TAKE 1 TABLET BY MOUTH EVERY DAY AS NEEDED FOR SWELLING   Keppra 100 MG/ML solution Generic drug: levETIRAcetam TAKE 12 MLS BY MOUTH EVERY MORNING AND 12 MLS AT NIGHT   ketotifen 0.025 % ophthalmic  solution Commonly known as: ZADITOR Apply 1 drop to eye 2 (two) times daily.   lactulose 10 GM/15ML solution Commonly known as: CHRONULAC TAKE 30 ML BY MOUTH TWICE DAILY   levOCARNitine 1 GM/10ML solution Commonly known as: CARNITOR TAKE 5 MLS BY MOUTH THREE TIMES DAILY   liver oil-zinc oxide 40 % ointment Commonly known as: DESITIN Apply 1 application topically daily.   nystatin powder Commonly known as: nystatin APPLY TO THE AFFECTED AREA WITH EACH DIAPER CHANGE UNTIL RASH IS GONE   pantoprazole sodium 40 mg Commonly known as: PROTONIX MIX 1 PACK AND TAKE DAILY AS DIRECTED   Topamax Sprinkle 25 MG capsule Generic drug: topiramate TAKE 6 CAPSULES BY MOUTH EVERY MORNING AND 6 CAPSULES IN THE EVENING WITH FOOD   VITAMIN D PO Take by mouth.  Total time spent with the patient was 20 minutes, of which 50% or more was spent in counseling and coordination of care.  Rockwell Germany NP-C [facility] Ph. [phone] Fax [phone]

## 2022-02-02 NOTE — Patient Instructions (Signed)
It was a pleasure to see you today!  Instructions for you until your next appointment are as follows: I changed Jadon's Diastat to Valtoco nasal spray as we discussed today.  Continue her other medications as prescribed Please sign up for MyChart if you have not done so. Please plan to return for follow up in 6 months or sooner if needed.  Feel free to contact our office during normal business hours at [phone] with questions or concerns. If there is no answer or the call is outside business hours, please leave a message and our clinic staff will call you back within the next business day.  If you have an urgent concern, please stay on the line for our after-hours answering service and ask for the on-call neurologist.     I also encourage you to use MyChart to communicate with me more directly. If you have not yet signed up for MyChart within [facility], the front desk staff can help you. However, please note that this inbox is NOT monitored on nights or weekends, and response can take up to 2 business days.  Urgent matters should be discussed with the on-call pediatric neurologist.   At [facility], we are committed to providing exceptional care. You will receive a patient satisfaction survey through text or email regarding your visit today. Your opinion is important to me. Comments are appreciated.

## 2022-02-05 ENCOUNTER — Encounter (INDEPENDENT_AMBULATORY_CARE_PROVIDER_SITE_OTHER): Payer: Self-pay | Admitting: Family

## 2022-02-06 ENCOUNTER — Encounter: Payer: Self-pay | Admitting: Occupational Therapy

## 2022-02-06 ENCOUNTER — Ambulatory Visit: Payer: Medicare Other | Attending: Family Medicine | Admitting: Occupational Therapy

## 2022-02-06 DIAGNOSIS — I890 Lymphedema, not elsewhere classified: Secondary | ICD-10-CM | POA: Diagnosis not present

## 2022-02-06 NOTE — Therapy (Signed)
OUTPATIENT OCCUPATIONAL THERAPY LYMPHEDEMA RE-EVALUATION and TREATMENT NOTE   Patient Name: Michele [name] MRN: [id number] DOB:1985/12/09, 36 y.o., female Today's Date: 02/06/2022  PCP: Billey Co, MD REFERRING PROVIDER: Billey Co, MD    OT End of Session - 02/06/22 0858     Visit Number 1   Number of Visits 6   Date for OT Re-Evaluation 05/07/22    OT Start Time 0803    OT Stop Time 0900    OT Time Calculation (min) 57 min    Activity Tolerance Patient tolerated treatment well;No increased pain;Other (comment)   intellectual delay, non verbal; very supportive mother, Michele [name]   Behavior During Therapy WFL for tasks assessed/performed             Past Medical History:  Diagnosis Date   Cerebral palsy (HCC)    Hypothyroidism    Lennox-Gastaut syndrome (HCC)    Osteoporosis    Pneumonia    Profound mental retardation    Reflux    Rickets    Seizures (HCC)    Past Surgical History:  Procedure Laterality Date   FOOT MASS EXCISION Right    Gastrocutaneous fistual repair  2012   PEG TUBE PLACEMENT     PEG TUBE REMOVAL  2012 ?   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   Morbid obesity (HCC) 12/24/2012   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   URINARY INCONTINENCE 02/25/2010   Full incontinence of feces 02/25/2010   Hypothyroidism 01/29/2009   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   Cerebral palsy (HCC) 01/29/2009   Osteoporosis 01/29/2009   Seizure disorder (HCC) 01/29/2009    ONSET  DATE: 07/23/2017  REFERRING DIAG: I89.0  THERAPY DIAG:  Lymphedema, not elsewhere classified  Rationale for Evaluation and Treatment Rehabilitation  PERTINENT HISTORY: BLE lymphedema, profound developmental delay , CP, Lennox-Gastaut syndrome, seisure disorder, hypothyroidism, osteoporosis, rickets, non-verbal   LIMITATIONS: paraplegia, impaired balance, postural impairment,developmental and intellectual delays,increased muscle tone BLE, hx seizures, incontinence. completely dependent for all ADLs   PRECAUTIONS: lymphedema  SUBJECTIVE: Michele [name] is referred to Occupational Therapy by Billey Co, MD, for lymphedema treatment, expressly assistance with replacement compression garments. Michele [name] is accompanied to OT by her mother, Michele [name], who states the purpose of their visit is assistance with obtaining replacement compression stockings for daytime. She states Michele [name]'s nighttime devices are still in good shape, but they are not available in clinic for assessment. She reportedly uses them nightly as directed. Michele [name] wears stockings daily as directed, and they are worn out and in need of replacement. Michele [name]'s mother is familiar with this process.  PAIN:  Are you having pain?  Pt is non-verbal. Does not appear to be in any pain  OBJECTIVE:   TODAY'S TREATMENT:  Reviewed recent medical history and discussed Mother's LE-related concerns Completed anatomical measurements for BLE custom , flat knit compression knee highs Pt/ family edu   PATIENT EDUCATION: Education details: Intro level re pneumatic compression devices,, how an advanced debvice, aka the Flexitouch, could assist with long term LE management at homer over time. Discussed trial process and need  for trial in clinic to determine if Michele [name] can tolerate sitting for 1 hour with one full limb in the pump for a complete cycle. She is interested in exploring the device and scheduling a trial. Handouts provided for additional online  info. Person educated: Parent Education method: Solicitor, and Handouts Education comprehension: verbalized understanding, returned demonstration, and needs further education   LYMPHEDEMA HOME PROGRAM: Pt dependent on CG to assist w all Full time waking hours , BLE Jobst ELVAREX, SOFT custom, flat knit knee high compression stockings, ccl 3 ( 34-40 mmHg). Mother endorses 100% compliance BLE Jobst RELAX convoluted knee length HOS devices designed to reduce fibrosis formation and facilitate improved lymphatic circulation during HOS Mother endorses 100% compliance Consistent daily skin care to BLE to limit infection risk Mother endorses 100% compliance Simple self-MLD to BLE. Non-compliant due to parent's time constraints and high burden of care     OT Long Term Goals - 02/06/22      OT LONG TERM GOAL #1   Title Pt to continue to follow Lymphedema precautions and prevention strategies with maximum caregiver assistance to limit LE progression and further functional decline.    Baseline Dependent    Time 6   Period months   Status Ongoing   Target Date 05/07/22     OT LONG TERM GOAL #2   Title Pt will continue to be able to apply knee length, custom, flat knit compression knee highs using assistive devices and max CG assistance to control BLE leg swelling and limit progression.   Baseline Dependent      Time 12    Period Weeks    Status Ongoing   Target Date 05/07/22     OT LONG TERM GOAL #4   Title Pt will continue to replace custom compression garments and devices within 3-6 months recommended time frame, and PRN, for optimal ongoing LE management over time.    Baseline dependent    Time 6    Period months   Status New 02/06/22        Target Date 05/07/22                                 Plan - 03/09/22      Clinical Impression Statement Michele [name] is doing well today in regard to lymphedema management at home. Her mother diligently assists her with  performing all home program components daily,  except for simple self MLD , due to time constraints and high burden of care. Pt will benefit from Flexitouch advanced sequential compression device, or pump, for optimal lymphedema self-care    between OT visits at home over time. After completing assessment of current compression stockings , it is determined that they are worn out and need replacement ASAP. Completed BLE anatomical measurements and specifications, which I faxed to DME vendor after visit. Pt will return for fitting as soon as they are available.     OT Occupational Profile and History Comprehensive Assessment- Review of records and extensive additional review of physical, cognitive, psychosocial history related to current functional performance     Occupational performance deficits (Please refer to evaluation for details): ADL's;IADL's;Rest and Sleep;Work;Leisure;Play;Social Participation     Body Structure / Function / Physical Skills ADL;Decreased knowledge of use of DME;Gait;Obesity;Strength;Balance;Dexterity;Pain;Tone;Edema;UE functional use;ROM;IADL;Endurance;Continence;Coordination;Flexibility;Mobility;Sensation;Skin integrity;Decreased knowledge of precautions     Rehab Potential Good     Clinical Decision Making Multiple treatment options, significant modification of task  necessary     Comorbidities Affecting Occupational Performance: May have comorbidities impacting occupational performance     Modification or Assistance to Complete Evaluation  Max significant modification of tasks or assist is necessary to complete     OT Frequency Other (comment)   Retrn for custom compression garment fitting ASAP. This may require multiple visits if garments need adjustment and remakes. Estimate 3 return visits to cover possible remeasurments and refitting    OT Treatment/Interventions Self-care/ADL training;DME and/or AE instruction;Therapeutic activities   anatomical measurement,  specifications,and  fitting for custom compression garments and devices    Plan fit replacements for custom daytime compression garments, including BLE, custom, knee length, Jobst, Elvarex SOFT, ccl 3 ( 34-46 mmHg) ; open toe, t heel, 2.5 SB on top, oblique top edge     Recommended Other Services Trial with Flexitouch advanced sequential pneumatic compression device, or "pump, in clinic to see if Michele [name] is able to tolerate 1 hour cycle with device on one full lower extremity and abdomen to be able to undergo full cycle of treatment.   Although more expensive than the basic" sequential "pump", the advanced Flexitouch device uses 32 sequential chambers and follows lymphatic anatomy to decongest lymphatic fluid and protein from proximal to distal, then above the inguinal lymph nodes towards deeper abdominal lymphatics,and  the deep thoracic duct. The basic device does not follow lymphatic anatomy. It provides sequential pressure from distal to proximal in a retrograde sequence against back pressure from lymphatic congestion in limb segments above, and uses only 4-8 chambers. Basic pumps cannot be calibrated to ensure correct compression.     Consulted and Agree with Plan of Care Family member/caregiver;Patient                   Patient will benefit from skilled therapeutic intervention in order to improve the following deficits and impairments:   Body Structure / Function / Physical Skills: ADL, Decreased knowledge of use of DME, Gait, Obesity, Strength, Balance, Dexterity, Pain, Tone, Edema, UE functional use, ROM, IADL, Endurance, Continence, Coordination, Flexibility, Mobility, Sensation, Skin integrity, Decreased knowledge of precautions     Visit Diagnosis: Lymphedema, not elsewhere classified  Andrey Spearman, MS, OTR/L, CLT-LANA 02/06/22 11:59 AM

## 2022-02-13 ENCOUNTER — Telehealth: Payer: Self-pay | Admitting: Family Medicine

## 2022-02-13 NOTE — Telephone Encounter (Signed)
Patient's mother dropped off request for prior approval form to be completed. Last DOS was 11/08/21. Placed in [employer].

## 2022-02-13 NOTE — Telephone Encounter (Signed)
Clinical info completed on incontinence supplies form.  Placed form in Dr Jerolyn Center box for completion.    When form is completed, please route note to "RN Team" and place in wall pocket in front office.   Sunday Spillers, CMA

## 2022-02-21 ENCOUNTER — Other Ambulatory Visit: Payer: Self-pay | Admitting: *Deleted

## 2022-02-21 MED ORDER — CETIRIZINE HCL 1 MG/ML PO SOLN: ORAL | 6 refills | Status: DC

## 2022-02-21 NOTE — Telephone Encounter (Signed)
Patient's mother called and informed that forms are ready for pick up. Copy made and placed in batch scanning. Original placed at front desk for pick up.  ° °Andric Kerce C Jadira Nierman, RN ° ° °

## 2022-03-13 ENCOUNTER — Telehealth: Payer: Self-pay | Admitting: Family Medicine

## 2022-03-13 NOTE — Telephone Encounter (Signed)
Patient needs a prescription for Ensure plus not in the carton because cartons will no longer be available. Ensure will now be issued in the bottle.  Original drink is on back order. What the the pharmacy has available is Boost Plus; Boost very high calorie; Boost Control, and PepsiCo.  Need a script for either to placed in her file.  Fax # [phone] Pls call mother [phone]

## 2022-03-14 MED ORDER — BOOST PLUS PO LIQD: 237.0000 mL | Freq: Three times a day (TID) | ORAL | 0 refills | Status: AC

## 2022-03-14 NOTE — Telephone Encounter (Signed)
Mom informed. Vennie Waymire T Finlee Milo, CMA  

## 2022-03-14 NOTE — Telephone Encounter (Signed)
Boost Plus 30 day supply sent in

## 2022-03-20 ENCOUNTER — Ambulatory Visit: Payer: Medicare Other | Attending: Family Medicine | Admitting: Occupational Therapy

## 2022-03-20 DIAGNOSIS — I890 Lymphedema, not elsewhere classified: Secondary | ICD-10-CM | POA: Diagnosis not present

## 2022-03-20 NOTE — Therapy (Signed)
OUTPATIENT OCCUPATIONAL THERAPY LYMPHEDEMA RE-EVALUATION and TREATMENT NOTE   Patient Name: Michele [name] MRN: [id number] DOB:1985/12/30, 36 y.o., female Today's Date: 03/20/2022  PCP: Lenoria Chime, MD REFERRING PROVIDER: Lenoria Chime, MD    OT End of Session - 02/06/22 0858     Visit Number 2   Number of Visits 6   Date for OT Re-Evaluation 05/07/22    OT Start Time 0805   OT Stop Time 0835   OT Time Calculation (min) 30 min    Activity Tolerance Patient tolerated treatment well;No increased pain;Other (comment)   intellectual delay, non verbal; very supportive mother, [name]   Behavior During Therapy WFL for tasks assessed/performed             Past Medical History:  Diagnosis Date   Cerebral palsy ([place])    Hypothyroidism    Lennox-Gastaut syndrome ([place])    Osteoporosis    Pneumonia    Profound mental retardation    Reflux    Rickets    Seizures ([place])    Past Surgical History:  Procedure Laterality Date   FOOT MASS EXCISION Right    Gastrocutaneous fistual repair  2012   PEG TUBE PLACEMENT     PEG TUBE REMOVAL  2012 ?   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[place]) 12/24/2012   Morbid obesity ([place]) 12/24/2012   Congenital quadriplegia ([place]) 12/24/2012   URINARY INCONTINENCE 02/25/2010   Full incontinence of feces 02/25/2010   Hypothyroidism 01/29/2009   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   Cerebral palsy ([place]) 01/29/2009   Osteoporosis 01/29/2009   Seizure disorder ([place]) 01/29/2009    ONSET  DATE: 07/23/2017  REFERRING DIAG: I89.0  THERAPY DIAG:  Lymphedema, not elsewhere classified  Rationale for Evaluation and Treatment Rehabilitation  PERTINENT HISTORY: BLE lymphedema, profound developmental delay , CP, Lennox-Gastaut syndrome, seisure disorder, hypothyroidism, osteoporosis, rickets, non-verbal   LIMITATIONS: paraplegia, impaired balance, postural impairment,developmental and intellectual delays,increased muscle tone BLE, hx seizures, incontinence. completely dependent for all ADLs   PRECAUTIONS: lymphedema  SUBJECTIVE: Michele [name] returns to OT today for initial fitting of BLE custom knee length compression garments. These are replacements for her existing garments, which are worn out. Baby is accompanied by her mother this morning. We had originally planned a trial of the Flexitouch for Jessicca this morning, but the manufacturer's rep had to reschedule. A new date for that trial is pending.  PAIN:  Are you having pain? Pt is non-verbal. Does not demonstrate any signs/symptoms of pain  OBJECTIVE:   TODAY'S TREATMENT:  Reviewed recent medical history and discussed Mother's LE-related concerns Completed initial CLE  custom compression garment fitting- replacements for existing Pt/ family edu   PATIENT EDUCATION: Pt / family edu for donning and doffing compression garments and devices using appropriate assistive devices, and garment/ device wear and care regimes and precautions. Handouts provided for additional online info. Person educated: Parent Education method: Consulting civil engineer, Demonstration, and Handouts Education comprehension: verbalized understanding, returned demonstration, and needs further education   LYMPHEDEMA HOME PROGRAM: Pt dependent on CG to assist w all Full time waking hours , BLE  Jobst ELVAREX, SOFT custom, flat knit knee high compression stockings, ccl 3 ( 34-40 mmHg). Mother endorses 100% compliance BLE Jobst RELAX convoluted knee length HOS devices  designed to reduce fibrosis formation and facilitate improved lymphatic circulation during HOS Mother endorses 100% compliance Consistent daily skin care to BLE to limit infection risk Mother endorses 100% compliance Simple self-MLD to BLE. Non-compliant due to parent's time constraints and high burden of care     OT Long Term Goals - 02/06/22      OT LONG TERM GOAL #1   Title Pt to continue to follow Lymphedema precautions and prevention strategies with maximum caregiver assistance to limit LE progression and further functional decline.    Baseline Dependent    Time 6   Period months   Status Achieved 03/19/22   Target Date 05/07/22     OT LONG TERM GOAL #2   Title Pt will continue to be able to apply knee length, custom, flat knit compression knee highs using assistive devices and max CG assistance to control BLE leg swelling and limit progression.   Baseline Dependent      Time 12    Period Weeks    Status Achieved 03/19/22   Target Date 05/07/22     OT LONG TERM GOAL #4   Title Pt will continue to replace custom compression garments and devices within 3-6 months recommended time frame, and PRN, for optimal ongoing LE management over time.    Baseline dependent    Time 6    Period months   Status New 02/06/22 Achieved 03/19/22        Target Date 05/07/22                                 Plan - 03/19/22      Clinical Impression Statement Reviewed progress towards doals. All goals met this date. Leg swelling is well managed today. Skin is mildly dry and flaking superficially at ankles. Fatty fibrosis persists, but remains mild. Pt remains 100% compliant with LE sel;f care home program with her mother's max assistance.Completed initial fitting for new BLE custom compression knee highs. Garments fit perfectly. No remakes needed. Mother will report any issues that may arise within 10 day window for remakes allowed by manufacturer. We'll coordinate with Pt's mother and [employer] rep and schedule Flexitouch trial  of this advanced sequential pneumatic compression device, or "pump" ASAP, to assist with self management of chronic , progressive LE at home. Cont as per POC.     OT Occupational Profile and History Comprehensive Assessment- Review of records and extensive additional review of physical, cognitive, psychosocial history related to current functional performance     Occupational performance deficits (Please refer to evaluation for details): ADL's;IADL's;Rest and Sleep;Work;Leisure;Play;Social Participation     Body Structure / Function / Physical Skills ADL;Decreased knowledge of use of DME;Gait;Obesity;Strength;Balance;Dexterity;Pain;Tone;Edema;UE functional use;ROM;IADL;Endurance;Continence;Coordination;Flexibility;Mobility;Sensation;Skin integrity;Decreased knowledge of precautions     Rehab Potential Good     Clinical Decision Making Multiple treatment options, significant modification of task necessary     Comorbidities Affecting Occupational Performance: May have comorbidities impacting occupational performance     Modification or Assistance to Complete Evaluation  Max significant modification of tasks or assist is necessary to complete     OT Frequency Other (comment)   Retrn for custom compression garment fitting ASAP. This may require multiple visits if garments need adjustment and remakes. Estimate 3 return visits  to cover possible remeasurments and refitting    OT Treatment/Interventions Self-care/ADL training;DME and/or AE instruction;Therapeutic activities   anatomical measurement, specifications,and  fitting for custom compression garments and devices    Plan fit replacements for custom daytime compression garments, including BLE, custom, knee length, Jobst, Elvarex SOFT, ccl 3 ( 34-46 mmHg) ; open toe, t heel, 2.5 SB on top, oblique top edge     Recommended Other Services Trial with Flexitouch advanced sequential pneumatic compression device, or  "pump, in clinic to see if Shanique is able to tolerate 1 hour cycle with device on one full lower extremity and abdomen to be able to undergo full cycle of treatment.   Although more expensive than the basic" sequential "pump", the advanced Flexitouch device uses 32 sequential chambers and follows lymphatic anatomy to decongest lymphatic fluid and protein from proximal to distal, then above the inguinal lymph nodes towards deeper abdominal lymphatics,and  the deep thoracic duct. The basic device does not follow lymphatic anatomy. It provides sequential pressure from distal to proximal in a retrograde sequence against back pressure from lymphatic congestion in limb segments above, and uses only 4-8 chambers. Basic pumps cannot be calibrated to ensure correct compression.     Consulted and Agree with Plan of Care Family member/caregiver;Patient                   Patient will benefit from skilled therapeutic intervention in order to improve the following deficits and impairments:   Body Structure / Function / Physical Skills: ADL, Decreased knowledge of use of DME, Gait, Obesity, Strength, Balance, Dexterity, Pain, Tone, Edema, UE functional use, ROM, IADL, Endurance, Continence, Coordination, Flexibility, Mobility, Sensation, Skin integrity, Decreased knowledge of precautions     Visit Diagnosis: Lymphedema, not elsewhere classified  Andrey Spearman, MS, OTR/L, CLT-LANA 03/20/22 11:30 AM

## 2022-04-18 ENCOUNTER — Ambulatory Visit: Payer: Medicare Other | Admitting: Occupational Therapy

## 2022-04-26 ENCOUNTER — Ambulatory Visit: Payer: Medicare Other | Attending: Family Medicine | Admitting: Occupational Therapy

## 2022-04-26 DIAGNOSIS — I890 Lymphedema, not elsewhere classified: Secondary | ICD-10-CM | POA: Insufficient documentation

## 2022-04-27 NOTE — Therapy (Addendum)
OUTPATIENT OCCUPATIONAL THERAPY LYMPHEDEMA TREATMENT NOTE   Patient Name: Michele [name] MRN: [id number] DOB:March 24, 1986, 36 y.o., female Today's Date: 04/27/2022  PCP: Lenoria Chime, MD REFERRING PROVIDER: Lenoria Chime, MD    OT End of Session - 02/06/22 0858     Visit Number 3   Number of Visits 6   Date for OT Re-Evaluation 05/07/22    OT Start Time 67   OT Stop Time 910   OT Time Calculation (min) 38mn    Activity Tolerance Patient tolerated treatment well;No increased pain;Other (comment)   intellectual delay, non verbal; very supportive mother, [name]   Behavior During Therapy WFL for tasks assessed/performed             Past Medical History:  Diagnosis Date   [place]    Hypothyroidism    [place]astaut [place]    Osteoporosis    Pneumonia    Profound mental retardation    Reflux    Rickets    Seizures ([place]    Past Surgical History:  Procedure Laterality Date   FOOT MASS EXCISION Right    Gastrocutaneous fistual repair  2012   PEG TUBE PLACEMENT     PEG TUBE REMOVAL  2012 ?   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 ([place] 07/06/2017   Lymphedema 01/22/2017   Acute cough 04/03/2016   Generalized convulsive epilepsy with intractable epilepsy ([place] 12/24/2012   Morbid obesity ([place] 12/24/2012   Congenital quadriplegia ([place] 12/24/2012   URINARY INCONTINENCE 02/25/2010   Full incontinence of feces 02/25/2010   Hypothyroidism 01/29/2009   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   Cerebral palsy ([place] 01/29/2009   Osteoporosis 01/29/2009   Seizure disorder ([place] 01/29/2009    ONSET DATE:  07/23/2017  REFERRING DIAG: I89.0  THERAPY DIAG:  Lymphedema, not elsewhere classified  Rationale for Evaluation and Treatment Rehabilitation  PERTINENT HISTORY: BLE lymphedema, CP, intractable Lennox-Gastaut syndrome, seisure disorder, hypothyroidism, osteoporosis, rickets, non-verbal   LIMITATIONS: paraplegia, impaired balance, postural impairment,developmental and intellectual delays,increased muscle tone BLE, hx seizures, urinary and fecal incontinence, non-verbal, dependent for all ADLs   PRECAUTIONS: lymphedema, falls  SUBJECTIVE: RKyree Fedorkopresents to OT to address BLE, Primary Lymphedema Praecox. Manufacturer's rep for [employer], MLuan Pulling is here today to assist w/ a trial of the basic ELyndal Pulley sequential pneumatic compression device ((U4403 needed to assist Pt and caregiver with long-term self-management of chronic, progressive lymphedema at home.  PAIN:  Are you having pain? Pt is non-verbal. Does not demonstrate any signs/symptoms of pain. She is unable to rate pain numerically , or with symbol scale.  OBJECTIVE:   TODAY'S TREATMENT:  Completed trial of [employer] basic ELyndal Pulley pneumatic compression "pump" Pt/ family edu   PATIENT EDUCATION: Pt/ family education for basic and advanced sequential pneumatic compression devices, or "pump", including clinical indications, how thy work, how the basic pneumatic "pump" differs from the advanced Flexitouch, indications, contraindications and precautions and care and use routines for pneumatic compression devices. Patient's questions were answered throughout trial and handouts and Internet resources given for reference. Handouts provided for additional online info. Person educated: Parent Education method: Explanation, DMedia planner and Handouts Education comprehension: Parent / primary caregiver verbalized understanding, returned demonstration, and  needs further education   LYMPHEDEMA HOME PROGRAM: Pt dependent on  CG to assist w all basic and instrumental ADLs Full time waking hours , BLE Jobst ELVAREX, SOFT custom, flat knit knee high compression stockings, ccl 3 ( 34-40 mmHg). Mother endorses 100% compliance BLE Jobst RELAX convoluted knee length HOS devices designed to reduce fibrosis formation and facilitate improved lymphatic circulation during HOS Mother endorses 100% compliance Consistent daily skin care to BLE to limit infection risk Mother endorses 100% compliance Simple self-MLD to BLE. Non-compliant due to parent's time constraints and high burden of care for this dependent patient    OT Long Term Goals - 04/27/22 0918       OT LONG TERM GOAL #1   Title Pt to follow Lymphedema precautions and prevention strategies with maximum caregiver assistance to limit LE progression and further functional decline.    Baseline Dependent    Time 12    Period Weeks    Status Achieved      OT LONG TERM GOAL #2   Title Lymphedema (LE) management/ self-care: Pt able to apply knee length, replacement, custom, flat knit compression knee highs using assistive devices and max CG assistance  with max caregiver assist x 1 to consistent garment wear essential for optimal LE management over time.    Baseline Dependent      Time 12    Period Weeks    Status Achieved      OT LONG TERM GOAL #3   Title Lymphedema (LE) management/ self-care:  Pt to achieve at least 10%  BLE limb volume reductions  during Intensive Phase CDT to limit LE progression, to reduce infection risk, and to improve functional mobility and transfers essential for optimal ADLs performance.    Baseline Dependent . Goal partially met. Limb volumes continue to fluctuate with weight gain. Last volumetric measurements revealed 12.10% volume reduction in RLE below the knee, and 7.2% volume increase in LLE below the knee. Goal discharged 04/27/22 as Pt is in self management phase of CDT vs Intensive, or active treatment phase.    Time 12    Period Weeks     Status Partially Met      OT LONG TERM GOAL #4   Title Lymphedema (LE) management/ self-care:  Pt to tolerate daily compression wraps, compression garments and/ or HOS devices in keeping w/ prescribed wear regimes within 1 week of issue date of each to progress and retain clinical and functional gains and to limit LE progression.    Baseline dependent    Time 12    Period Weeks    Status Achieved      OT LONG TERM GOAL #5   Title Lymphedema (LE) management/ self-care:  During Management Phase CDT Pt to sustain current limb volumes within 3%, and all other clinical gains achieved during OT treatment iwith maximum caregiver assistance  (to control limb swelling and associated pain/ discomfort, to  limit LE progression, to decrease infection risk and to limit further functional decline.    Baseline dependent    Time 6    Period Months    Status Not Met      Long Term Additional Goals   Additional Long Term Goals Yes      OT LONG TERM GOAL #6   Title Pt will be able to don, doff and utilize the advanced Flexitouch sequential pneumatic compression device several days weekly with maximum caregiver assistance utilizing 30-50 mmHg for a one hour cycle on alternating lower extremities  within 2 weeks of issue date and training by manufacturer in the home.    Baseline dependent    Time 2    Period Weeks    Status New    Target Date --   2 weeks s/p issue date.             Plan - 03/19/22      Clinical Impression Statement Lauralye Kinn, a 74 y o female with moderate, stage II, BLE, primary, lymphedema praecox, failed trial of the [employer] basic Lyndal Pulley' (E0651) sequential pneumatic compression "pump" today utilizing 30 mmHg for 40 minutes due to increases in swelling in the proximal R thigh and abdomen. Patient is well known to this OT having completed Intensive Phase Complete Decongestive Therapy (CDT) between 07/23/17 and 01/07/19. She returned for garment updates from 03/21/21 through  06/23/20, and recently returned for follow along care on 02/06/22. She has been 100% compliant with daily compression garments, elevation and skin care with maximum assistance from her mother, who is her primary caregiver. Regan is non-verbal, unable to perform lymphatic pumping therex and unable to perform self-MLD. Significant symptoms of bilateral lower extremity and truncal edema persist, including lower extremity swelling and associated pain, and chronic swelling of the abdomen and buttocks. Pt was able to tolerate the trial , long sitting on treatment bed, without increased pain or agitation. Incorrectly the basic compression pump has the capability to treat the patient's legs only and lacks an abdominal garment for truncal decongestion. Lymphatic swelling and high tissue protein are visible and palpable in the legs, thighs, abdomen and buttocks region as evidenced by pre and post-trial measurements The Flexitouch Plus [phone] Advanced Pneumatic Compression Device is medically necessary to treat Marguarite's symptoms of bilateral lower extremity and bilateral lower quadrant  lymphedema. Because it incorporates garments for trunk and lower extremity for decongestion. Wew will conduct a trial of the Flexitouch device next week.     OT Occupational Profile and History Comprehensive Assessment- Review of records and extensive additional review of physical, cognitive, psychosocial history related to current functional performance     Occupational performance deficits (Please refer to evaluation for details): ADL's;IADL's;Rest and Sleep;Work;Leisure;Play;Social Participation     Body Structure / Function / Physical Skills ADL;Decreased knowledge of use of DME;Gait;Obesity;Strength;Balance;Dexterity;Pain;Tone;Edema;UE functional use;ROM;IADL;Endurance;Continence;Coordination;Flexibility;Mobility;Sensation;Skin integrity;Decreased knowledge of precautions     Rehab Potential Good     Clinical Decision Making Multiple  treatment options, significant modification of task necessary     Comorbidities Affecting Occupational Performance: May have comorbidities impacting occupational performance     Modification or Assistance to Complete Evaluation  Max significant modification of tasks or assist is necessary to complete     OT Frequency Other (comment)   Retrn for custom compression garment fitting ASAP. This may require multiple visits if garments need adjustment and remakes. Estimate 3 return visits to cover possible remeasurments and refitting    OT Treatment/Interventions Self-care/ADL training;DME and/or AE instruction;Therapeutic activities   anatomical measurement, specifications,and  fitting for custom compression garments and devices    Plan fit replacements for custom daytime compression garments, including BLE, custom, knee length, Jobst, Elvarex SOFT, ccl 3 ( 34-46 mmHg) ; open toe, t heel, 2.5 SB on top, oblique top edge     Recommended Other Services Trial with Flexitouch advanced sequential pneumatic compression device, or "pump, in clinic to see if Emmogene is able to tolerate 1 hour cycle with device on one full lower extremity and abdomen to be able to undergo full cycle  of treatment.   Although more expensive than the basic" sequential "pump", the advanced Flexitouch device uses 32 sequential chambers and follows lymphatic anatomy to decongest lymphatic fluid and protein from proximal to distal, then above the inguinal lymph nodes towards deeper abdominal lymphatics,and  the deep thoracic duct. The basic device does not follow lymphatic anatomy. It provides sequential pressure from distal to proximal in a retrograde sequence against back pressure from lymphatic congestion in limb segments above, and uses only 4-8 chambers. Basic pumps cannot be calibrated to ensure correct compression.     Consulted and Agree with Plan of Care Family member/caregiver;Patient                   Patient will benefit from  skilled therapeutic intervention in order to improve the following deficits and impairments:   Body Structure / Function / Physical Skills: ADL, Decreased knowledge of use of DME, Gait, Obesity, Strength, Balance, Dexterity, Pain, Tone, Edema, UE functional use, ROM, IADL, Endurance, Continence, Coordination, Flexibility, Mobility, Sensation, Skin integrity, Decreased knowledge of precautions     Visit Diagnosis: Lymphedema, not elsewhere classified Andrey Spearman, MS, OTR/L, CLT-LANA 04/27/22 10:03 AM

## 2022-05-03 ENCOUNTER — Encounter: Payer: Self-pay | Admitting: Occupational Therapy

## 2022-05-03 ENCOUNTER — Ambulatory Visit: Payer: Medicare Other | Admitting: Occupational Therapy

## 2022-05-03 DIAGNOSIS — I890 Lymphedema, not elsewhere classified: Secondary | ICD-10-CM

## 2022-05-03 NOTE — Therapy (Signed)
OUTPATIENT OCCUPATIONAL THERAPY LYMPHEDEMA TREATMENT NOTE   Patient Name: Michele [name] MRN: [id number] DOB:31-Dec-1985, 36 y.o., female Today's Date: 05/03/2022  PCP: Lenoria Chime, MD REFERRING PROVIDER: Lenoria Chime, MD    OT End of Session - 02/06/22 0858     Visit Number 4   Number of Visits 6   Date for OT Re-Evaluation 05/07/22    OT Start Time 800   OT Stop Time 900   OT Time Calculation (min) 60 min    Activity Tolerance Patient tolerated treatment well;No increased pain;Other (comment)   intellectual delay, non verbal; very supportive mother, [name]   Behavior During Therapy WFL for tasks assessed/performed             Past Medical History:  Diagnosis Date   Cerebral palsy ([place])    Hypothyroidism    Lennox-Gastaut syndrome ([place])    Osteoporosis    Pneumonia    Profound mental retardation    Reflux    Rickets    Seizures ([employer])    Past Surgical History:  Procedure Laterality Date   FOOT MASS EXCISION Right    Gastrocutaneous fistual repair  2012   PEG TUBE PLACEMENT     PEG TUBE REMOVAL  2012 ?   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[place]) 12/24/2012   URINARY INCONTINENCE 02/25/2010   Full incontinence of feces 02/25/2010   Hypothyroidism 01/29/2009   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   Cerebral palsy ([place]) 01/29/2009   Osteoporosis 01/29/2009   Seizure disorder ([place]) 01/29/2009    ONSET DATE:  07/23/2017  REFERRING DIAG: I89.0  THERAPY DIAG:  Lymphedema, not elsewhere classified  Rationale for Evaluation and Treatment Rehabilitation  PERTINENT HISTORY: BLE lymphedema, CP, intractable Lennox-Gastaut syndrome, seizure disorder, hypothyroidism, osteoporosis, rickets, non-verbal   LIMITATIONS: paraplegia, impaired balance, postural impairment,developmental and intellectual delays,increased muscle tone BLE, hx seizures, urinary and fecal incontinence, non-verbal, dependent for all ADLs   PRECAUTIONS: lymphedema, falls  SUBJECTIVE: Michele [name] presents to OT to address BLE, Primary Lymphedema Praecox. Manufacturer's rep for [employer], Luan Pulling, is here today to assist w/ a trial of the advanced Flexitouch sequential pneumatic compression device (N2355) needed to assist Pt and caregiver with long-term self-management of chronic, progressive lymphedema at home.  PAIN: Are you having pain? Pt is non-verbal. Does not demonstrate any signs/symptoms of pain. She is unable to rate pain numerically , or with symbol scale.  OBJECTIVE:   TODAY'S TREATMENT:  Completed trial of [employer] basic Lyndal Pulley' pneumatic compression "pump" Pt/ family edu   PATIENT EDUCATION: Pt/ family education for basic and advanced sequential pneumatic compression devices, or "pump", including clinical indications, how thy work, how the basic pneumatic "pump" differs from the advanced Flexitouch, indications, contraindications and precautions and care and use routines for pneumatic compression devices. Patient's questions were answered throughout trial and handouts and Internet resources given for reference. Handouts provided for additional online info. Person educated: Parent Education method: Explanation, Media planner, and Handouts Education comprehension: Parent / primary caregiver verbalized understanding, returned demonstration, and  needs further education   LYMPHEDEMA HOME PROGRAM: Pt  dependent on CG to assist w all basic and instrumental ADLs Full time waking hours , BLE Jobst ELVAREX, SOFT custom, flat knit knee high compression stockings, ccl 3 ( 34-40 mmHg). Mother endorses 100% compliance BLE Jobst RELAX convoluted knee length HOS devices designed to reduce fibrosis formation and facilitate improved lymphatic circulation during HOS Mother endorses 100% compliance Consistent daily skin care to BLE to limit infection risk Mother endorses 100% compliance Simple self-MLD to BLE. Non-compliant due to parent's time constraints and high burden of care for this dependent patient    OT Long Term Goals - 04/27/22 0918       OT LONG TERM GOAL #1   Title Pt to follow Lymphedema precautions and prevention strategies with maximum caregiver assistance to limit LE progression and further functional decline.    Baseline Dependent    Time 12    Period Weeks    Status Achieved      OT LONG TERM GOAL #2   Title Lymphedema (LE) management/ self-care: Pt able to apply knee length, replacement, custom, flat knit compression knee highs using assistive devices and max CG assistance  with max caregiver assist x 1 to consistent garment wear essential for optimal LE management over time.    Baseline Dependent      Time 12    Period Weeks    Status Achieved      OT LONG TERM GOAL #3   Title Lymphedema (LE) management/ self-care:  Pt to achieve at least 10%  BLE limb volume reductions  during Intensive Phase CDT to limit LE progression, to reduce infection risk, and to improve functional mobility and transfers essential for optimal ADLs performance.    Baseline Dependent . Goal partially met. Limb volumes continue to fluctuate with weight gain. Last volumetric measurements revealed 12.10% volume reduction in RLE below the knee, and 7.2% volume increase in LLE below the knee. Goal discharged 04/27/22 as Pt is in self management phase of CDT vs Intensive, or active treatment phase.    Time 12     Period Weeks    Status Partially Met      OT LONG TERM GOAL #4   Title Lymphedema (LE) management/ self-care:  Pt to tolerate daily compression wraps, compression garments and/ or HOS devices in keeping w/ prescribed wear regimes within 1 week of issue date of each to progress and retain clinical and functional gains and to limit LE progression.    Baseline dependent    Time 12    Period Weeks    Status Achieved      OT LONG TERM GOAL #5   Title Lymphedema (LE) management/ self-care:  During Management Phase CDT Pt to sustain current limb volumes within 3%, and all other clinical gains achieved during OT treatment iwith maximum caregiver assistance  (to control limb swelling and associated pain/ discomfort, to  limit LE progression, to decrease infection risk and to limit further functional decline.    Baseline dependent    Time 6    Period Months    Status Not Met      Long Term Additional Goals   Additional Long Term Goals Yes      OT LONG TERM GOAL #6   Title Pt will be able to don, doff and utilize the advanced Flexitouch sequential pneumatic compression device several days weekly with maximum caregiver assistance utilizing 30-50 mmHg for a one hour cycle on alternating lower extremities  within 2 weeks of issue date and training by manufacturer in the home.    Baseline dependent    Time 2    Period Weeks    Status New    Target Date --   2 weeks s/p issue date.             Plan - 03/19/22      Clinical Impression Statement Mackinzee Roszak, a 18 y o female with moderate, stage II, BLE, primary, lymphedema praecox, failed trial of the [employer] basic Lyndal Pulley' (E0651) sequential pneumatic compression pump last week, utilizing 30-40 mmHg for 50 minutes due to increases in swelling in the proximal R thigh and abdomen. Today Pt is trialing advanced Flexitouch device (L0786) for 1 hour at 30-50 mmHg. Trial of the basic and advanced devices were split into 2 sessions to ensure Michele [name]'s  tolerance due to her profound intellectual disabilities.  Patient is well known to this OT having completed Intensive Phase Complete Decongestive Therapy (CDT) between 07/23/17 and 01/07/19. She returned for garment updates from 03/21/21 through 06/23/20, and recently returned for follow along care on 02/06/22. She has been 100% compliant with daily compression garments, elevation and skin care with maximum assistance from her mother, who is her primary caregiver. Michele [name] is non-verbal, unable to perform lymphatic pumping therex and unable to perform self-MLD. Significant symptoms of bilateral lower extremity and truncal edema persist, including lower extremity swelling and associated pain, and chronic swelling of the abdomen and buttocks. Pt was able to tolerate the 50 minute Entre' and hour long Flexitouch trials while long sitting on treatment bed without increased pain or agitation.   The basic Lyndal Pulley' device treats the patient's legs only and lacks the essential abdominal garment for truncal decongestion. Lymphatic swelling and high tissue protein remained visible and palpable in the legs, thighs, abdomen and buttocks region after the [place]' trial. (Refer to note dated 04/27/22.) The Flexitouch Plus E0652 Advanced Pneumatic Compression Device is medically necessary to treat Deztinee's symptoms of bilateral lower extremity and bilateral lower quadrant  lymphedema to limit lymphedema progression and infection risk over time. Because it incorporates garments for trunk and lower extremity decongestion.      OT Occupational Profile and History Comprehensive Assessment- Review of records and extensive additional review of physical, cognitive, psychosocial history related to current functional performance     Occupational performance deficits (Please refer to evaluation for details): ADL's;IADL's;Rest and Sleep;Work;Leisure;Play;Social Participation     Body Structure / Function / Physical Skills ADL;Decreased knowledge of  use of DME;Gait;Obesity;Strength;Balance;Dexterity;Pain;Tone;Edema;UE functional use;ROM;IADL;Endurance;Continence;Coordination;Flexibility;Mobility;Sensation;Skin integrity;Decreased knowledge of precautions     Rehab Potential Good     Clinical Decision Making Multiple treatment options, significant modification of task necessary     Co morbidities Affecting Occupational Performance: May have co morbidities impacting occupational performance     Modification or Assistance to Complete Evaluation  Max significant modification of tasks or assist is necessary to complete     OT Frequency Other (comment)   Return for custom compression garment fitting ASAP. This may require multiple visits if garments need adjustment and remakes. Estimate 3 return visits to cover possible remeasurments and refitting    OT Treatment/Interventions Self-care/ADL training;DME and/or AE instruction;Therapeutic activities   anatomical measurement, specifications,and  fitting for custom compression garments and devices    Plan fit replacements for custom daytime compression garments, including BLE, custom, knee length, Jobst, Elvarex SOFT, ccl 3 ( 34-46 mmHg) ; open toe, t heel, 2.5 SB on top, oblique top edge  Recommended Other Services Trial with Flexitouch advanced sequential pneumatic compression device, or "pump, in clinic to see if [place] is able to tolerate 1 hour cycle with device on one full lower extremity and abdomen to be able to undergo full cycle of treatment.   Although more expensive than the basic" sequential "pump", the advanced Flexitouch device uses 32 sequential chambers and follows lymphatic anatomy to decongest lymphatic fluid and protein from proximal to distal, then above the inguinal lymph nodes towards deeper abdominal lymphatics,and  the deep thoracic duct. The basic device does not follow lymphatic anatomy. It provides sequential pressure from distal to proximal in a retrograde sequence against back  pressure from lymphatic congestion in limb segments above, and uses only 4-8 chambers. Basic pumps cannot be calibrated to ensure correct compression.     Consulted and Agree with Plan of Care Family member/caregiver;Patient                   Patient will benefit from skilled therapeutic intervention in order to improve the following deficits and impairments:   Body Structure / Function / Physical Skills: ADL, Decreased knowledge of use of DME, Gait, Obesity, Strength, Balance, Dexterity, Pain, Tone, Edema, UE functional use, ROM, IADL, Endurance, Continence, Coordination, Flexibility, Mobility, Sensation, Skin integrity, Decreased knowledge of precautions     Visit Diagnosis: Lymphedema, not elsewhere classified   Andrey Spearman, MS, OTR/L, CLT-LANA 05/03/22 9:37 AM

## 2022-05-03 NOTE — Patient Instructions (Signed)

## 2022-05-22 ENCOUNTER — Other Ambulatory Visit: Payer: Self-pay | Admitting: Family Medicine

## 2022-05-30 ENCOUNTER — Telehealth: Payer: Self-pay | Admitting: Family Medicine

## 2022-05-30 NOTE — Telephone Encounter (Signed)
Patient's mother dropped off certification of medical necessity form to be completed. Last DOS was 11/08/21. Placed in [employer].

## 2022-05-30 NOTE — Telephone Encounter (Signed)
Clinical info completed on Medical Necessity form.  Placed form in Dr Jerolyn Center box for completion.    When form is completed, please route note to "RN Team" and place in wall pocket in front office.   Sunday Spillers, CMA

## 2022-05-31 NOTE — Telephone Encounter (Signed)
Patient's mother called and informed that forms are ready for pick up. Copy made and placed in batch scanning. Original placed at front desk for pick up.  ° °Maigan Bittinger C Reka Wist, RN ° ° °

## 2022-06-01 ENCOUNTER — Other Ambulatory Visit (INDEPENDENT_AMBULATORY_CARE_PROVIDER_SITE_OTHER): Payer: Self-pay | Admitting: Family

## 2022-06-01 DIAGNOSIS — G40319 Generalized idiopathic epilepsy and epileptic syndromes, intractable, without status epilepticus: Secondary | ICD-10-CM

## 2022-06-01 NOTE — Telephone Encounter (Signed)
Seen 02/02/22 f/u 08/15/22 last rf 12/23/21 with 5 rf

## 2022-06-05 ENCOUNTER — Other Ambulatory Visit (INDEPENDENT_AMBULATORY_CARE_PROVIDER_SITE_OTHER): Payer: Self-pay | Admitting: Family

## 2022-06-05 DIAGNOSIS — G40319 Generalized idiopathic epilepsy and epileptic syndromes, intractable, without status epilepticus: Secondary | ICD-10-CM

## 2022-06-26 ENCOUNTER — Other Ambulatory Visit (INDEPENDENT_AMBULATORY_CARE_PROVIDER_SITE_OTHER): Payer: Self-pay | Admitting: Family

## 2022-06-26 DIAGNOSIS — G40319 Generalized idiopathic epilepsy and epileptic syndromes, intractable, without status epilepticus: Secondary | ICD-10-CM

## 2022-06-27 ENCOUNTER — Telehealth (INDEPENDENT_AMBULATORY_CARE_PROVIDER_SITE_OTHER): Payer: Self-pay | Admitting: Family

## 2022-06-27 NOTE — Telephone Encounter (Signed)
Who's calling (name and relationship to patient) : Michele [name]: mom   Best contact number: [phone]  Provider they see: Con Memos  Reason for call: Mom called in wanting to speak with Otila Kluver she stated the [employer] is requesting Prior Authorization for Depakote, she stated that the seizure medicine is medically necessary.   Call ID:      PRESCRIPTION REFILL ONLY  Name of prescription:  Pharmacy:

## 2022-06-27 NOTE — Telephone Encounter (Signed)
Call to pharmacy. They did get med to go through for 30 d but received message will require PA for next refill per [facility]. RN advised will ask Otila Kluver NP RN is not sure how to complete one with Medicare.

## 2022-06-28 NOTE — Telephone Encounter (Signed)
PA for Depakote Sprinkles was initiated through CoverMyMeds. TG

## 2022-06-28 NOTE — Telephone Encounter (Signed)
Toamax Rx (25 mg capsules) Printed/Faxed (per Dr. Jordan Hawks).  B. Roten CMA

## 2022-07-06 ENCOUNTER — Telehealth (INDEPENDENT_AMBULATORY_CARE_PROVIDER_SITE_OTHER): Payer: Self-pay | Admitting: Family

## 2022-07-06 NOTE — Telephone Encounter (Signed)
The PA has been initiated and is pending. TG

## 2022-07-06 NOTE — Telephone Encounter (Signed)
  Name of who is calling: [name]  Caller's Relationship to Patient: mom   Best contact number: [phone]  Provider they see: Rockwell [place]  Reason for call: [employer] reached out to mom. A PA is needed for her topamax.

## 2022-07-21 ENCOUNTER — Ambulatory Visit (INDEPENDENT_AMBULATORY_CARE_PROVIDER_SITE_OTHER): Payer: Medicare Other | Admitting: Family Medicine

## 2022-07-21 ENCOUNTER — Other Ambulatory Visit: Payer: Self-pay | Admitting: Family Medicine

## 2022-07-21 ENCOUNTER — Encounter: Payer: Self-pay | Admitting: Family Medicine

## 2022-07-21 VITALS — BP 123/76 | HR 80 | Temp 99.7°F | Resp 20

## 2022-07-21 DIAGNOSIS — R7989 Other specified abnormal findings of blood chemistry: Secondary | ICD-10-CM

## 2022-07-21 DIAGNOSIS — E038 Other specified hypothyroidism: Secondary | ICD-10-CM

## 2022-07-21 DIAGNOSIS — E663 Overweight: Secondary | ICD-10-CM

## 2022-07-21 DIAGNOSIS — Z0000 Encounter for general adult medical examination without abnormal findings: Secondary | ICD-10-CM | POA: Diagnosis not present

## 2022-07-21 DIAGNOSIS — R7309 Other abnormal glucose: Secondary | ICD-10-CM | POA: Diagnosis not present

## 2022-07-21 DIAGNOSIS — Z23 Encounter for immunization: Secondary | ICD-10-CM

## 2022-07-21 DIAGNOSIS — E7800 Pure hypercholesterolemia, unspecified: Secondary | ICD-10-CM

## 2022-07-21 MED ORDER — ALBUTEROL SULFATE (2.5 MG/3ML) 0.083% IN NEBU: 2.5000 mg | INHALATION_SOLUTION | Freq: Four times a day (QID) | RESPIRATORY_TRACT | 3 refills | Status: DC | PRN

## 2022-07-21 MED ORDER — NEBULIZER MASK ADULT MISC: 6 refills | Status: AC

## 2022-07-21 NOTE — Assessment & Plan Note (Signed)
Mother interested in lab work today.  Will obtain CBC, CMP, A1c, lipids for routine surveillance.  TSH for history of hypothyroidism, not currently on medication.  B12 and folate given seizure medications.  DME orders placed for nebulizer mask set up, new hospital bed, and Hoyer lift.  Message sent to RN pool to have this order sent to [employer].  Company will call patient's mother to coordinate delivery.  Due for Medicare annual wellness visit.  Message sent to front desk staff to schedule.  Flu vaccine administered, tolerated well without adverse side effects. Cannot administer COVID booster as we are currently out.  Encouraged mom to either go to pharmacy or return to our clinic in about a week or so.

## 2022-07-21 NOTE — Patient Instructions (Signed)
It was wonderful to see you today. Thank you for allowing me to be a part of your care. Below is a short summary of what we discussed at your visit today:  Physical exam Today we obtained blood work to look at your cholesterol, average blood sugar (A1c), thyroid, blood electrolytes, kidney and liver function, and red blood cell counts. If the results are normal, I will send you a letter or MyChart message. If the results are abnormal, I will give you a call.    Medications and supplies needed I have sent the prescription for the albuterol nebulizer solution to your pharmacy.  Please pick this up at your convenience.  I have ordered new nebulizer mask, hospital bed, and Hoyer lift for you.  Our nurses here at clinic will send the script straight to a medical supply company who will call you to arrange delivery.   Vaccines Today you received the annual flu vaccine. You may experience some residual soreness at the injection site.  Gentle stretches and regular use of that arm will help speed up your recovery.  As the vaccines are giving your immune system a "practice run" against specific infections, you may feel a little under the weather for the next several days.  We recommend rest as needed and hydrating.  COVID booster: We unfortunately are out of the COVID booster at this time. You could either go to the pharmacy to have this given or come back to our clinic in about a week for a nurse only vaccine appointment (call ahead to ensure availability).  TDAP: You are not due for your next TDAP booster until 2028. You are covered until then!   Medicare Annual Wellness Exam Your chart indicates that you are due for your Medicare annual wellness exam.  Your insurance likes [place] to do one of these every year.  This is a nurse only visit that takes about 30 minutes to an hour.  It can be done either in person or virtually.  This is an in-depth visit that focuses on preventative care and keeping you healthy.   Somebody from our clinic will be calling you soon to get this scheduled.   Please bring all of your medications to every appointment!  If you have any questions or concerns, please do not hesitate to contact us via phone or MyChart message.   Ezequiel Essex, MD

## 2022-07-21 NOTE — Progress Notes (Signed)
    SUBJECTIVE:   CHIEF COMPLAINT / HPI:   Annual physical  Michele [name] is a pleasant 37 y.o. woman who presents for her annual physical exam.  Medications and allergies updated.  Vaccines due: COVID and flu booster Screenings due: None Pap: Not done, not sexually active  Needs:  New hospital bed New Hoyer lift Nebulizer masking Albuterol nebulizer med refill  Mother also notes new bilateral resting hand tremors, noticed in the last couple of months.  She is wonders if it is from a new med from neurology.  She reports a return to neurology in about a month, I encouraged her to discuss this with them.  Does not appear painful or spastic.  She still has her normal hand function.  PERTINENT  PMH / PSH: Cerebral palsy, congenital quadriplegia, intractable Lennox-Gastaut syndrome, seizure disorder, osteoporosis, lymphedema, obesity  OBJECTIVE:   BP 123/76 (BP Location: Left Arm, Patient Position: Sitting, Cuff Size: Large)   Pulse 80   Temp 99.7 F (37.6 C) (Axillary)   Resp 20   SpO2 98%    General: Awake, alert, NAD, nonverbal in room HEENT: Sclera anicteric, lips appear well-hydrated Cardiac: Regular rate and rhythm, no murmur Respiratory: CTAB anterior and posterior, exam limited by patient cooperation Abdomen: Bowel sounds present, abdomen soft and nontender Extremities: Hands cool to touch, normal radial pulse on left  ASSESSMENT/PLAN:   Healthcare maintenance Mother interested in lab work today.  Will obtain CBC, CMP, A1c, lipids for routine surveillance.  TSH for history of hypothyroidism, not currently on medication.  B12 and folate given seizure medications.  DME orders placed for nebulizer mask set up, new hospital bed, and Hoyer lift.  Message sent to RN pool to have this order sent to [employer].  Company will call patient's mother to coordinate delivery.  Due for Medicare annual wellness visit.  Message sent to front desk staff to schedule.  Flu vaccine  administered, tolerated well without adverse side effects. Cannot administer COVID booster as we are currently out.  Encouraged mom to either go to pharmacy or return to our clinic in about a week or so.     Ezequiel Essex, MD [place]

## 2022-07-23 LAB — COMPREHENSIVE METABOLIC PANEL
ALT: 8 IU/L (ref 0–32)
AST: 19 IU/L (ref 0–40)
Albumin/Globulin Ratio: 1 — ABNORMAL LOW (ref 1.2–2.2)
Albumin: 3.3 g/dL — ABNORMAL LOW (ref 3.9–4.9)
Alkaline Phosphatase: 80 IU/L (ref 44–121)
BUN/Creatinine Ratio: 10 (ref 9–23)
BUN: 10 mg/dL (ref 6–20)
Bilirubin Total: <0.2 mg/dL (ref 0.0–1.2)
CO2: 23 mmol/L (ref 20–29)
Calcium: 9.3 mg/dL (ref 8.7–10.2)
Chloride: 105 mmol/L (ref 96–106)
Creatinine, Ser: 0.97 mg/dL (ref 0.57–1.00)
Globulin, Total: 3.4 g/dL (ref 1.5–4.5)
Glucose: 97 mg/dL (ref 70–99)
Potassium: 3.9 mmol/L (ref 3.5–5.2)
Sodium: 143 mmol/L (ref 134–144)
Total Protein: 6.7 g/dL (ref 6.0–8.5)
eGFR: 78 mL/min/{1.73_m2} (ref >59)

## 2022-07-23 LAB — CBC
Hematocrit: 43 % (ref 34.0–46.6)
Hemoglobin: 14.3 g/dL (ref 11.1–15.9)
MCH: 34 pg — ABNORMAL HIGH (ref 26.6–33.0)
MCHC: 33.3 g/dL (ref 31.5–35.7)
MCV: 102 fL — ABNORMAL HIGH (ref 79–97)
Platelets: 159 10*3/uL (ref 150–450)
RBC: 4.2 x10E6/uL (ref 3.77–5.28)
RDW: 13.2 % (ref 11.7–15.4)
WBC: 11.4 10*3/uL — ABNORMAL HIGH (ref 3.4–10.8)

## 2022-07-23 LAB — HEMOGLOBIN A1C
Est. average glucose Bld gHb Est-mCnc: 117 mg/dL
Hgb A1c MFr Bld: 5.7 % — ABNORMAL HIGH (ref 4.8–5.6)

## 2022-07-23 LAB — FOLATE: Folate: 6.4 ng/mL (ref >3.0)

## 2022-07-23 LAB — LIPID PANEL
Chol/HDL Ratio: 3.2 ratio (ref 0.0–4.4)
Cholesterol, Total: 188 mg/dL (ref 100–199)
HDL: 59 mg/dL (ref >39)
LDL Chol Calc (NIH): 106 mg/dL — ABNORMAL HIGH (ref 0–99)
Triglycerides: 129 mg/dL (ref 0–149)
VLDL Cholesterol Cal: 23 mg/dL (ref 5–40)

## 2022-07-23 LAB — VITAMIN B12: Vitamin B-12: 516 pg/mL (ref 232–1245)

## 2022-07-23 LAB — TSH: TSH: 3.91 u[IU]/mL (ref 0.450–4.500)

## 2022-07-24 ENCOUNTER — Other Ambulatory Visit (INDEPENDENT_AMBULATORY_CARE_PROVIDER_SITE_OTHER): Payer: Self-pay

## 2022-07-24 DIAGNOSIS — G40319 Generalized idiopathic epilepsy and epileptic syndromes, intractable, without status epilepticus: Secondary | ICD-10-CM

## 2022-07-26 ENCOUNTER — Telehealth: Payer: Self-pay | Admitting: Family Medicine

## 2022-07-26 DIAGNOSIS — G800 Spastic quadriplegic cerebral palsy: Secondary | ICD-10-CM

## 2022-07-26 DIAGNOSIS — R062 Wheezing: Secondary | ICD-10-CM

## 2022-07-26 DIAGNOSIS — G808 Other cerebral palsy: Secondary | ICD-10-CM

## 2022-07-26 DIAGNOSIS — G40814 Lennox-Gastaut syndrome, intractable, without status epilepticus: Secondary | ICD-10-CM

## 2022-07-26 DIAGNOSIS — F73 Profound intellectual disabilities: Secondary | ICD-10-CM

## 2022-07-26 DIAGNOSIS — G40319 Generalized idiopathic epilepsy and epileptic syndromes, intractable, without status epilepticus: Secondary | ICD-10-CM

## 2022-07-26 MED ORDER — TOPAMAX SPRINKLE 25 MG PO CPSP: ORAL_CAPSULE | ORAL | 5 refills | Status: DC

## 2022-07-26 NOTE — Telephone Encounter (Signed)
Community message sent to [employer] for hospital bed. No orders found for hoyer lift. Nebulizer mask was ordered as medication, this will need to be updated to DME.  Will forward to PCP.   Talbot Grumbling, RN

## 2022-07-26 NOTE — Telephone Encounter (Signed)
Called mother to discuss lab work. Only significant is A1c now in pre-diabetic range.   Normal B12 and folate. TSH and lipids normal. Albumin slightly low, but improved from previous. CBC with slight leukocytosis, nonsignificant. A1c now pre-diabetic.  Discussed diet changes with mother.   Ezequiel Essex, MD

## 2022-07-26 NOTE — Telephone Encounter (Signed)
Patient's mother came in stating that when they were seen on Friday 07/21/22 orders were placed for a new bed and other supplies. She says they have not received anything yet, or heard from anyone. She would like someone to give her a call please to let her know when she can expect them, or to give her the number of the company the order was placed with please. [phone]

## 2022-07-28 NOTE — Telephone Encounter (Signed)
Community message sent to [employer] for processing.

## 2022-08-03 NOTE — Telephone Encounter (Signed)
Received follow up message from [employer] regarding DME orders.   So she does not yet qualify [facility] every 5 y[facility]. Pt Lift and Gel overlay we can process through insurance but need the [facility] narratives in the F2F notes in order to do so. Also have her primary DX on the order as "annual physical exam"- we need a DX- there is no other in notes.   Thanks!    Forwarding to PCP.   Talbot Grumbling, RN

## 2022-08-07 ENCOUNTER — Encounter (HOSPITAL_COMMUNITY): Payer: Self-pay

## 2022-08-07 ENCOUNTER — Ambulatory Visit (HOSPITAL_COMMUNITY)
Admission: EM | Admit: 2022-08-07 | Discharge: 2022-08-07 | Disposition: A | Discharge status: Home / Self Care | Payer: Medicare Other | Attending: Family Medicine | Admitting: Family Medicine

## 2022-08-07 DIAGNOSIS — J069 Acute upper respiratory infection, unspecified: Secondary | ICD-10-CM | POA: Insufficient documentation

## 2022-08-07 DIAGNOSIS — U071 COVID-19: Secondary | ICD-10-CM | POA: Diagnosis not present

## 2022-08-07 MED ORDER — PROMETHAZINE-DM 6.25-15 MG/5ML PO SYRP: 5.0000 mL | ORAL_SOLUTION | Freq: Four times a day (QID) | ORAL | 0 refills | Status: DC | PRN

## 2022-08-07 NOTE — ED Triage Notes (Signed)
Patient's mother  reports that the patient has had a fever and a non productive cough x 3 days.  Patient's mother reports giving the patient Tussin DMMax and a Tylenol suppository for her fever. The alst dose was 1200 today.

## 2022-08-07 NOTE — Discharge Instructions (Addendum)
Take Phenergan with dextromethorphan syrup--5 mL or 1 teaspoon every 6 hours as needed for cough   You have been swabbed for COVID, and the test will result in the next 24 hours. Our staff will call you if positive. If the COVID test is positive, you should quarantine for 5 days from the start of your symptoms.  On days 6-10 from the start of your illness, you should wear a mask if out in public.  If she starts having trouble breathing, or her color is not good, or she cannot keep fluids down, then please proceed to the emergency room for further evaluation

## 2022-08-07 NOTE — ED Provider Notes (Signed)
[place]    CSN: [id number] Arrival date & time: 08/07/22  1322      History   Chief Complaint Chief Complaint  Patient presents with   Fever   Cough    HPI Michele [name] is a 37 y.o. female.    Fever Associated symptoms: cough   Cough Associated symptoms: fever    Here for cough and nasal congestion.  Symptoms began on February 16.  Mom notes she has had subjective fever and Tylenol suppositories have helped reduce the fever to normal.  Last time she thought she had fever was this morning.  No nausea or vomiting.  She has diarrhea every day Because she takes lactulose.  Appetite is reduced, but she has been taking in fluids.  Exposed to a relative recently that was ill.  Mom states she came here and got "shots".  She does not know if she tested positive for anything  Patient just got a flu shot on February 2.  She did not get a COVID-vaccine then because the supply was out at that time.  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   Lymphedema 01/22/2017   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    OB History   No obstetric history on file.      Home Medications    Prior to Admission medications   Medication Sig Start Date End Date Taking? Authorizing Provider  promethazine-dextromethorphan (PROMETHAZINE-DM) 6.25-15 MG/5ML syrup Take 5 mLs by mouth 4 (four) times daily as needed for cough. 08/07/22  Yes Barrett Henle, MD  acetaminophen (TYLENOL) 500 MG tablet Take 500 mg by mouth every 6 (six) hours as needed for fever.    [provider]  acetaminophen (TYLENOL) 650 MG suppository Place 650 mg rectally every 8 (eight) hours as needed for mild pain.    [provider]  albuterol (PROVENTIL) (2.5 MG/3ML) 0.083% nebulizer solution Take 3 mLs (2.5 mg total) by nebulization every 6 (six) hours as needed for wheezing or shortness of breath. 07/21/22   Ezequiel Essex, MD  BANZEL 40 MG/ML SUSP TAKE 5MLS EVERY MORNING AND 5MLS EVERY NIGHT AT BEDTIME 12/23/21   Rockwell Germany, NP  Calcium Carbonate-Vitamin D (CALCARB 600/D) 600-400 MG-UNIT tablet Take 1 tablet by mouth once a week.    [provider]  cetirizine HCl (ZYRTEC) 1 MG/ML solution TAKE 10 ML(10 MG) BY MOUTH DAILY 07/21/22   Ezequiel Essex, MD  DEPAKOTE SPRINKLES 125 MG capsule TAKE 8 CAPSULES BY MOUTH TWICE DAILY 12/23/21   Rockwell Germany, NP  diazepam (DIASTAT ACUDIAL) 20 MG GEL DIAL TO 15 MG, GIVE RECTALLY FOR SEIZURES LASTING 2 MINUTES OR LONGER 09/19/21   Rockwell Germany, NP  fluticasone (FLONASE) 50 MCG/ACT nasal spray SHAKE LIQUID AND USE 2 SPRAYS IN EACH NOSTRIL TWICE DAILY 05/22/22   Ezequiel Essex, MD  furosemide (LASIX) 20 MG tablet TAKE 1 TABLET BY MOUTH EVERY DAY AS NEEDED FOR SWELLING 05/02/21   Carollee Leitz, MD  KEPPRA 100 MG/ML solution TAKE 12 MLS BY MOUTH MORNING EVERY MORNING AND AT NIGHT 06/01/22   Rockwell Germany, NP  ketotifen (ZADITOR) 0.025 % ophthalmic solution Apply 1 drop to eye 2 (two) times daily. 08/22/21   [provider]  lactose free nutrition (BOOST PLUS) LIQD Take 237 mLs by mouth 3 (three) times daily with meals. 03/14/22   Zola Button, MD  lactulose (CHRONULAC) 10 GM/15ML solution TAKE 30 ML BY MOUTH TWICE DAILY 12/28/21   Ezequiel Essex, MD  levOCARNitine (CARNITOR) 1 GM/10ML solution TAKE 5 ML BY MOUTH THREE TIMES DAILY 06/05/22   Rockwell Germany, NP  liver oil-zinc oxide (DESITIN) 40 % ointment Apply 1 application topically daily.    [provider]  nystatin powder APPLY TO THE AFFECTED AREA WITH EACH DIAPER CHANGE UNTIL RASH IS GONE 06/27/21   Lattie Haw, MD  pantoprazole sodium (PROTONIX) 40 mg MIX 1 PACK AND TAKE DAILY AS DIRECTED 12/21/21   Ezequiel Essex, MD  Respiratory Therapy Supplies (NEBULIZER MASK ADULT) MISC Use with nebulizer machine when needed 07/21/22   Ezequiel Essex, MD  TOPAMAX SPRINKLE 25 MG capsule TAKE 6 CAPSULES BY MOUTH EVERY MORNING AND IN THE EVENING WITH FOOD 07/26/22   Rockwell Germany, NP  VALTOCO 20 MG DOSE 10 MG/0.1ML LQPK Give 1 spray in each nostril for seizures lasting 2 minutes or longer. 02/02/22   Rockwell Germany, NP  VITAMIN D PO Take by mouth.    [provider]    Family History Family History  Problem Relation Age of Onset   Hypertension Mother    Cancer Maternal Aunt        lung   Heart disease Maternal Grandmother        Died at 19   Stroke Maternal Grandfather        died at 55   Hypertension Paternal Grandmother    Diabetes Other        MGGM   Heart disease Other        MGGF   Kidney disease Other        Maternal Great Aunt    Colon cancer Neg Hx     Social History Social History   Tobacco Use   Smoking status: Never    Passive exposure: Never   Smokeless tobacco: Never   Vaping Use   Vaping Use: Never used  Substance Use Topics   Alcohol use: No   Drug use: No     Allergies   Carbamazepine   Review of Systems Review of Systems  Constitutional:  Positive for fever.  Respiratory:  Positive for cough.      Physical Exam Triage Vital Signs ED Triage Vitals [08/07/22 1554]  Enc Vitals Group     BP      Pulse Rate 91     Resp 20  Temp 98.2 F (36.8 C)     Temp Source Oral     SpO2 98 %     Weight      Height      Head Circumference      Peak Flow      Pain Score      Pain Loc      Pain Edu?      Excl. in [place]?    No data found.  Updated Vital Signs Pulse 91   Temp 98.2 F (36.8 C) (Oral)   Resp 20   LMP 08/07/2022   SpO2 98%   Visual Acuity Right Eye Distance:   Left Eye Distance:   Bilateral Distance:    Right Eye Near:   Left Eye Near:    Bilateral Near:     Physical Exam Vitals reviewed.  Constitutional:      General: She is not in acute distress.    Appearance: She is not ill-appearing, toxic-appearing or diaphoretic.  HENT:     Nose: Congestion present.     Mouth/Throat:     Mouth: Mucous membranes are moist.  Eyes:     Extraocular Movements: Extraocular movements intact.     Conjunctiva/sclera: Conjunctivae normal.     Pupils: Pupils are equal, round, and reactive to light.  Cardiovascular:     Rate and Rhythm: Normal rate and regular rhythm.     Heart sounds: No murmur heard. Pulmonary:     Effort: No respiratory distress.     Breath sounds: No stridor. No wheezing, rhonchi or rales.  Musculoskeletal:     Cervical back: Neck supple.  Lymphadenopathy:     Cervical: No cervical adenopathy.  Skin:    Capillary Refill: Capillary refill takes less than 2 seconds.     Coloration: Skin is not jaundiced or pale.  Neurological:     General: No focal deficit present.     Mental Status: She is alert and oriented to person, place, and time.  Psychiatric:        Behavior: Behavior normal.      UC  Treatments / Results  Labs (all labs ordered are listed, but only abnormal results are displayed) Labs Reviewed  SARS CORONAVIRUS 2 (TAT 6-24 HRS)    EKG   Radiology No results found.  Procedures Procedures (including critical care time)  Medications Ordered in UC Medications - No data to display  Initial Impression / Assessment and Plan / UC Course  I have reviewed the triage vital signs and the nursing notes.  Pertinent labs & imaging results that were available during my care of the patient were reviewed by me and considered in my medical decision making (see chart for details).        Vital signs that are done are reassuring, and her lung exam is normal.  I am not going to do a flu swab today, as she is at 72 hours of illness.  COVID swab is done today and if positive, she is a candidate for Paxlovid.  Her last EGFR was 78 earlier this month.  If there are interactions with her maintenance medications and the Paxlovid, then she should be prescribed molnupiravir.  Very given dextromethorphan is sent for the cough. Final Clinical Impressions(s) / UC Diagnoses   Final diagnoses:  Viral upper respiratory tract infection     Discharge Instructions      Take Phenergan with dextromethorphan syrup--5 mL or 1 teaspoon every 6 hours as needed for cough  You have been swabbed for COVID, and the test will result in the next 24 hours. Our staff will call you if positive. If the COVID test is positive, you should quarantine for 5 days from the start of your symptoms.  On days 6-10 from the start of your illness, you should wear a mask if out in public.  If she starts having trouble breathing, or her color is not good, or she cannot keep fluids down, then please proceed to the emergency room for further evaluation      ED Prescriptions     Medication Sig Dispense Auth. Provider   promethazine-dextromethorphan (PROMETHAZINE-DM) 6.25-15 MG/5ML syrup Take 5 mLs by mouth 4  (four) times daily as needed for cough. 118 mL Barrett Henle, MD      PDMP not reviewed this encounter.   Barrett Henle, MD 08/07/22 [phone]

## 2022-08-08 ENCOUNTER — Ambulatory Visit: Payer: Medicare Other | Admitting: Student

## 2022-08-08 ENCOUNTER — Telehealth (HOSPITAL_COMMUNITY): Payer: Self-pay | Admitting: Emergency Medicine

## 2022-08-08 LAB — SARS CORONAVIRUS 2 (TAT 6-24 HRS): SARS Coronavirus 2: POSITIVE — AB

## 2022-08-08 MED ORDER — NIRMATRELVIR/RITONAVIR (PAXLOVID)TABLET: 3.0000 | ORAL_TABLET | Freq: Two times a day (BID) | ORAL | 0 refills | Status: AC

## 2022-08-09 ENCOUNTER — Telehealth: Payer: Self-pay | Admitting: Family Medicine

## 2022-08-09 NOTE — Telephone Encounter (Signed)
Clinical info completed on Handicap Plac[employer] form.  Placed form in Dr Arby Barrette box for completion.    When form is completed, please route note to "RN Team" and place in wall pocket in front office.   Ottis Stain, CMA

## 2022-08-09 NOTE — Telephone Encounter (Signed)
Patient's mother dropped off DMV form to be completed. Last DOS was 07/21/22. Placed in [employer]

## 2022-08-10 ENCOUNTER — Telehealth (INDEPENDENT_AMBULATORY_CARE_PROVIDER_SITE_OTHER): Payer: Self-pay | Admitting: Family

## 2022-08-10 DIAGNOSIS — G40812 Lennox-Gastaut syndrome, not intractable, without status epilepticus: Secondary | ICD-10-CM

## 2022-08-10 DIAGNOSIS — G40319 Generalized idiopathic epilepsy and epileptic syndromes, intractable, without status epilepticus: Secondary | ICD-10-CM

## 2022-08-10 MED ORDER — LEVOCARNITINE 1 GM/10ML PO SOLN: ORAL | 5 refills | Status: DC

## 2022-08-10 MED ORDER — BANZEL 40 MG/ML PO SUSP: ORAL | 5 refills | Status: DC

## 2022-08-10 MED ORDER — TOPAMAX SPRINKLE 25 MG PO CPSP: ORAL_CAPSULE | ORAL | 5 refills | Status: DC

## 2022-08-10 MED ORDER — VALTOCO 20 MG DOSE 10 MG/0.1ML NA LQPK: NASAL | 5 refills | Status: DC

## 2022-08-10 MED ORDER — KEPPRA 100 MG/ML PO SOLN: ORAL | 5 refills | Status: DC

## 2022-08-10 NOTE — Telephone Encounter (Signed)
Attempted to contact patients mother to inquire about which medication that needs a refill.  Unable to be reached.  SS, CCMA

## 2022-08-10 NOTE — Telephone Encounter (Signed)
I called and spoke with Mom. The appointment was changed to March 12th. I will send in refills for her. TG

## 2022-08-10 NOTE — Telephone Encounter (Signed)
  Name of who is calling: [name]  Caller's Relationship to Patient: Mom  Best contact number: [phone]   Provider they see: Rockwell Germany  Reason for call: Mom is calling to see if she should bring Michele [name] to appt on Tuesday 08/15/22? [name] tested positive for COVID on Monday 08/07/22. Mom also states that she'll be out of medication on this Saturday 08/12/22 and need refill on all of her medication.     PRESCRIPTION REFILL ONLY  Name of prescription:  Pharmacy:

## 2022-08-11 ENCOUNTER — Telehealth: Payer: Self-pay | Admitting: Family Medicine

## 2022-08-11 NOTE — Telephone Encounter (Signed)
Pt needs a new order for the mattress with the overlay and Hoyerlift; Contact person is Jayna p# [phone]. Need a prescription faxed to [phone]  for the nebulizer kit; Huey Romans is the contact

## 2022-08-13 NOTE — Addendum Note (Signed)
Addended by: Renard Hamper on: 08/13/2022 04:25 PM   Modules accepted: Orders

## 2022-08-15 ENCOUNTER — Other Ambulatory Visit: Payer: Self-pay | Admitting: Family Medicine

## 2022-08-15 ENCOUNTER — Ambulatory Visit (INDEPENDENT_AMBULATORY_CARE_PROVIDER_SITE_OTHER): Payer: Medicare Other | Admitting: Family

## 2022-08-15 ENCOUNTER — Encounter: Payer: Self-pay | Admitting: Family Medicine

## 2022-08-15 DIAGNOSIS — Z993 Dependence on wheelchair: Secondary | ICD-10-CM

## 2022-08-15 DIAGNOSIS — J45909 Unspecified asthma, uncomplicated: Secondary | ICD-10-CM | POA: Insufficient documentation

## 2022-08-15 NOTE — Telephone Encounter (Signed)
DMV form placed up front for pick up.   Copy made for batch scanning.   Attempted to inform patient, however no answer or option for VM.

## 2022-08-15 NOTE — Progress Notes (Signed)
Letter of need so [employer] can provide DME.  Ezequiel Essex, MD

## 2022-08-15 NOTE — Telephone Encounter (Signed)
Community message sent to [employer]. 

## 2022-08-18 ENCOUNTER — Telehealth: Payer: Self-pay | Admitting: Family Medicine

## 2022-08-18 NOTE — Telephone Encounter (Signed)
The equipment was not delivered because more documentation is need. Need to be in a format and not a letter.  The contact number is [phone] for [employer].  Also contact Calla Kicks [phone] (legal guardian)

## 2022-08-21 ENCOUNTER — Telehealth: Payer: Self-pay | Admitting: Student

## 2022-08-21 NOTE — Telephone Encounter (Signed)
Patient's mother calls after-hours line reporting that Michele [name] recently completed course of Paxlovid for COVID infection diagnosed at urgent care but is still having persistent cough. She is eating and drinking, but perhaps less than normal. She has been afebrile. Mom gave her a nebulizer treatment due to the cough but has not noted any increase in her work of breathing. Mom is wondering if the patient should come to the ED. Note that patient is already scheduled for an office visit in the am with Dr. Jeani Hawking. Advised mom that as long as she is free of respiratory distress, it would be appropriate to wait and come to her visit in the morning. Reviewed ER precautions for respiratory distress or sudden change in status. Mom voices understanding and looks forward to her visit with Dr. Jeani Hawking in the morning.    Marnee Guarneri, MD

## 2022-08-22 ENCOUNTER — Ambulatory Visit (INDEPENDENT_AMBULATORY_CARE_PROVIDER_SITE_OTHER): Payer: Medicare Other | Admitting: Family Medicine

## 2022-08-22 ENCOUNTER — Encounter: Payer: Self-pay | Admitting: Family Medicine

## 2022-08-22 VITALS — BP 126/82 | HR 76

## 2022-08-22 DIAGNOSIS — G808 Other cerebral palsy: Secondary | ICD-10-CM | POA: Diagnosis not present

## 2022-08-22 DIAGNOSIS — G800 Spastic quadriplegic cerebral palsy: Secondary | ICD-10-CM | POA: Diagnosis not present

## 2022-08-22 DIAGNOSIS — J4520 Mild intermittent asthma, uncomplicated: Secondary | ICD-10-CM | POA: Diagnosis not present

## 2022-08-22 DIAGNOSIS — F73 Profound intellectual disabilities: Secondary | ICD-10-CM

## 2022-08-22 DIAGNOSIS — G40814 Lennox-Gastaut syndrome, intractable, without status epilepticus: Secondary | ICD-10-CM | POA: Diagnosis not present

## 2022-08-22 DIAGNOSIS — Z993 Dependence on wheelchair: Secondary | ICD-10-CM

## 2022-08-22 DIAGNOSIS — J189 Pneumonia, unspecified organism: Secondary | ICD-10-CM

## 2022-08-22 DIAGNOSIS — Z0000 Encounter for general adult medical examination without abnormal findings: Secondary | ICD-10-CM | POA: Diagnosis not present

## 2022-08-22 MED ORDER — PREDNISONE 20 MG PO TABS: 20.0000 mg | ORAL_TABLET | Freq: Every day | ORAL | 0 refills | Status: AC

## 2022-08-22 MED ORDER — FLUCONAZOLE 150 MG PO TABS: 150.0000 mg | ORAL_TABLET | Freq: Once | ORAL | 0 refills | Status: AC

## 2022-08-22 MED ORDER — AZITHROMYCIN 250 MG PO TABS: ORAL_TABLET | ORAL | 0 refills | Status: AC

## 2022-08-22 MED ORDER — NEBULIZER/PEDIATRIC MASK KIT: PACK | 11 refills | Status: AC

## 2022-08-22 MED ORDER — ALBUTEROL SULFATE (2.5 MG/3ML) 0.083% IN NEBU: 2.5000 mg | INHALATION_SOLUTION | Freq: Four times a day (QID) | RESPIRATORY_TRACT | 3 refills | Status: DC | PRN

## 2022-08-22 NOTE — Patient Instructions (Signed)
It was wonderful to see you today. Thank you for allowing me to be a part of your care. Below is a short summary of what we discussed at your visit today:  COVID upper respiratory illness follow-up Because Michele [name] has intermittent wheezing and coughing this far after COVID, I am going to treat her with an antibiotic and a steroid just to be safe.  Azithromycin: Take 2 tablets on day 1, then 1 tablet every day until the prescription is finished (5 days total) Prednisone: Take 1 tablet every morning with breakfast for 5 days  Return for reevaluation if she develops high fevers, worsening cough, or cannot breathe well  DME I will re-submit the DME prescriptions to [facility] today around lunchtime.  We will keep working on this until you get what you need.   If you have any questions or concerns, please do not hesitate to contact us via phone or MyChart message.   Ezequiel Essex, MD

## 2022-08-22 NOTE — Assessment & Plan Note (Signed)
Concern for possible infection given worsening wheezing and coughing about 2 weeks after COVID diagnosis.  Will prescribe prednisone and azithromycin for empiric treatment of secondary pneumonia.  Return precautions given, see AVS for more.

## 2022-08-22 NOTE — Progress Notes (Signed)
SUBJECTIVE:   CHIEF COMPLAINT / HPI:   COVID follow up Patient presents with mom to discuss continued shortness of breath, coughing, and wheezing 2 weeks after initial COVID diagnosis.  Had improved initially after positive COVID test 2/19, now with intermittent symptoms.  Did need to use albuterol nebulizer last night, as mom was very concerned about patient coughing and wheezing.  No measurable fevers since 2/19.  Eating and drinking normally.  Normal urine output.  DME needed Relevant medical diagnoses with associated ICD 10 codes:  Congenital quadriplegia G80.8 Cerebral palsy G80.9 Intractable Lennox-Gastaut syndrome G40.814 Generalized convulsive epilepsy G40.319 Profound intellectual disabilities F73 Urinary incontinence R32 Fecal incontinence R15.9 Reactive airway disease J45.909 Wheelchair dependence Z99.3  Equipment needed Nebulizer compressor Nebulizer kit Gel overlay Hospital bed mattress replacement Hoyer lift   Replacement hospital bed mattress [place] requires repositioning of the body in ways that cannot be achieved with an ordinary bed or wedge pillow to limit pain, reduce pressure, and prevent bedsores.  She is quadriplegic (G80.8) and is wheelchair-bound (Z99.3).  She also has profound intellectual disabilities (F73) due to her cerebral palsy (G80.9) and intractable Lennox-Gastaut syndrome (G40.814).   Hoyer lift Please see attached office visit note from 07/21/2022, conducted by myself, which mentions need for [facility] lift.  Due to the above-mentioned conditions as well as Bruce's adult size, her caretakers require a Hoyer lift in order to safely care for her.   Gel overlay Due to the above-mentioned conditions, Dannyelle has both limited mobility and incontinence.  Her incontinence includes urinary incontinence (R32) and fecal incontinence (R15.9).   Nebulizer and associated supplies Leina deals with reactive airway disease with wheezing (J45.909).  She frequently  needs nebulizer treatments especially when she becomes ill with an upper respiratory illness.  She has never before been formally diagnosed with asthma, as her profound intellectual disabilities (F73) preclude our ability to successfully perform pulmonary function testing.  Her cerebral palsy (G80.9) and congenital quadriplegia (G80.8) make it difficult to administer albuterol via inhaler, thus necessitating nebulizer supplies.    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   Morbid obesity ([place]) 12/24/2012   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   URINARY INCONTINENCE 02/25/2010   Full incontinence of feces 02/25/2010   Hypothyroidism 01/29/2009   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   Cerebral palsy ([place]) 01/29/2009   Osteoporosis 01/29/2009   Seizure disorder ([place]) 01/29/2009    OBJECTIVE:   BP 126/82   Pulse 76   LMP 08/07/2022   SpO2 91%    General: Awake, alert, NAD HEENT: Sclera anicteric and without injection, mild crusting at corners of eyes, TMs pearly pink and flat bilaterally, moderate cerumen burden of left canal, oral mucosa moist, white coating on tongue Cardiac: Regular rate and rhythm, no murmur, brisk cap refill, normal skin turgor Respiratory: CTAB (although limited by inability to follow directions for deep inspiration) Abdomen: Bowel sounds present, soft,  nontender  ASSESSMENT/PLAN:   Secondary pneumonia Concern for possible infection given worsening  wheezing and coughing about 2 weeks after COVID diagnosis.  Will prescribe prednisone and azithromycin for empiric treatment of secondary pneumonia.  Return precautions given, see AVS for more.  Healthcare maintenance DME needed for home, including nebulizer machine, nebulizer mask and tubing, hospital bed mattress replacement, gel mattress overlay, and Hoyer lift.  I have printed out scripts for all of these, mother signed ROI to release office records.  We will send all these materials to [facility], as our prior attempts to provide documentation were not exactly what they needed.     Ezequiel Essex, MD [place]

## 2022-08-22 NOTE — Addendum Note (Signed)
Addended by: Renard Hamper on: 08/22/2022 01:15 PM   Modules accepted: Orders

## 2022-08-22 NOTE — Assessment & Plan Note (Signed)
DME needed for home, including nebulizer machine, nebulizer mask and tubing, hospital bed mattress replacement, gel mattress overlay, and Hoyer lift.  I have printed out scripts for all of these, mother signed ROI to release office records.  We will send all these materials to [facility], as our prior attempts to provide documentation were not exactly what they needed.

## 2022-08-24 ENCOUNTER — Telehealth: Payer: Self-pay | Admitting: Family Medicine

## 2022-08-24 ENCOUNTER — Other Ambulatory Visit (INDEPENDENT_AMBULATORY_CARE_PROVIDER_SITE_OTHER): Payer: Self-pay | Admitting: Family

## 2022-08-24 DIAGNOSIS — G40319 Generalized idiopathic epilepsy and epileptic syndromes, intractable, without status epilepticus: Secondary | ICD-10-CM

## 2022-08-24 MED ORDER — DEPAKOTE SPRINKLES 125 MG PO CSDR: DELAYED_RELEASE_CAPSULE | ORAL | 5 refills | Status: DC

## 2022-08-24 NOTE — Telephone Encounter (Signed)
  Name of who is calling: Zenon Mayo   Caller's Relationship to Patient: Mother  Best contact number: [phone]  Provider they see: Cloretta Ned  Reason for call: Calling in for prescription refill.      PRESCRIPTION REFILL ONLY  Name of prescription: Depakote Sprinkles 125 MG  Pharmacy:

## 2022-08-24 NOTE — Telephone Encounter (Signed)
Patient's mother came in stating that she spoke with [employer] and was told that they received the paperwork from [place] but it was not put together correctly. Asks that we call [employer] and speak with [place], [phone]. Please call mom if there are any questions.

## 2022-08-29 ENCOUNTER — Ambulatory Visit (INDEPENDENT_AMBULATORY_CARE_PROVIDER_SITE_OTHER): Payer: Medicare Other | Admitting: Family

## 2022-08-29 ENCOUNTER — Encounter (INDEPENDENT_AMBULATORY_CARE_PROVIDER_SITE_OTHER): Payer: Self-pay | Admitting: Family

## 2022-08-29 VITALS — HR 80 | Resp 20

## 2022-08-29 DIAGNOSIS — G40319 Generalized idiopathic epilepsy and epileptic syndromes, intractable, without status epilepticus: Secondary | ICD-10-CM | POA: Diagnosis not present

## 2022-08-29 DIAGNOSIS — G40814 Lennox-Gastaut syndrome, intractable, without status epilepticus: Secondary | ICD-10-CM

## 2022-08-29 DIAGNOSIS — G801 Spastic diplegic cerebral palsy: Secondary | ICD-10-CM

## 2022-08-29 DIAGNOSIS — I890 Lymphedema, not elsewhere classified: Secondary | ICD-10-CM

## 2022-08-29 DIAGNOSIS — R251 Tremor, unspecified: Secondary | ICD-10-CM

## 2022-08-29 DIAGNOSIS — F73 Profound intellectual disabilities: Secondary | ICD-10-CM | POA: Diagnosis not present

## 2022-08-29 DIAGNOSIS — Z993 Dependence on wheelchair: Secondary | ICD-10-CM

## 2022-08-29 NOTE — Progress Notes (Signed)
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 She is taking and tolerating brand Depakote, Keppra, Topamax and Banzel for her seizure disorder. Michele [name] typically turns blue and requires blow by oxygen when seizures occur. She is completely dependent upon others for all activities of daily living. She is cared for at home by her mother.   Due to her medical condition, she is indefinitely incontinent of stool and urine.  It is medically necessary for her to use diapers, underpads, and gloves to assist with hygiene and skin integrity.     Today's concerns: Has been sick recently with Covid-19 infection with slow recovery. Has been weaker since the infection Had 2 seizures during illness Has tremor of both hands. Mom believes it is worse since the infection. Has new wheelchair Michele [name] has been otherwise generally healthy since she was last seen. No health concerns today other than previously mentioned.  Review of systems: Please see HPI for neurologic and other pertinent review of systems. Otherwise all other systems were reviewed and were negative.  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[place]) 12/24/2012   URINARY INCONTINENCE 02/25/2010   Full incontinence of feces 02/25/2010   Hypothyroidism 01/29/2009   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   Cerebral palsy ([place]) 01/29/2009   Osteoporosis 01/29/2009   Seizure disorder ([place]) 01/29/2009     Past Medical History:  Diagnosis Date   Acute hypoxemic respiratory failure due to COVID-19 ([place]) 08/03/2019   Cerebral palsy ([place])    Hypothyroidism    Lennox-Gastaut syndrome ([place])    Osteoporosis    Pneumonia    Poor appetite 02/21/2021   Profound mental retardation    Reflux    Rickets    Seizures ([place])    Upper respiratory symptom 06/02/2019    Past medical history comments: See HPI  Surgical history: Past Surgical History:  Procedure Laterality Date   FOOT MASS EXCISION Right    Gastrocutaneous fistual repair  2012   PEG TUBE PLACEMENT     PEG TUBE REMOVAL  2012 ?     Family history: family history includes Cancer in her maternal aunt; Diabetes in an other family member; Heart disease in her maternal grandmother and another family member; Hypertension in her mother and paternal grandmother; Kidney disease in an other family member; Stroke in her maternal grandfather.   Social history: Social History   Socioeconomic History   Marital status: Single    Spouse name: Not on file   Number of children: 0   Years of education: Not on file   Highest education level: Not on file  Occupational History   Occupation: Disabled   Tobacco Use  Smoking status: Never    Passive exposure: Never   Smokeless tobacco: Never  Vaping Use   Vaping Use: Never used  Substance and Sexual Activity   Alcohol use: No   Drug use: No   Sexual activity: Never  Other Topics Concern   Not on file  Social History Narrative   0 caffeine drinks    Lives with mother who helps ADLs.    Graduated from [employer] in 2010. Now attends day program at [employer].    Enjoys playing in water, ripping paper, watching TV, music and going out.   Social Determinants of Health   Financial Resource Strain: Not on file  Food Insecurity: Not on file  Transportation Needs: Not on file  Physical Activity: Not on file  Stress: Not on file  Social Connections: Not on file  Intimate Partner Violence: Not on file    Past/failed meds: Copied from previous record: Carbamazepine - rash   Allergies: Allergies  Allergen Reactions   Carbamazepine Rash    Immunizations: Immunization History  Administered Date(s) Administered   Influenza Split 04/14/2011, 03/22/2012   Influenza Whole 05/26/2009, 04/04/2010   Influenza,inj,Quad PF,6+ Mos 04/03/2013, 04/04/2014, 03/08/2015, 03/08/2016, 03/26/2017, 04/12/2018, 03/03/2019, 03/26/2020, 02/18/2021, 07/21/2022   PFIZER(Purple Top)SARS-COV-2 Vaccination 11/10/2019, 12/01/2019, 05/28/2020   Pfizer Covid-19 Vaccine Bivalent Booster 41yr & up 05/31/2021   Tdap 01/14/2014    Diagnostics/Screenings:  Physical Exam: Pulse 80   Resp 20   LMP 08/07/2022   General: Well developed, well nourished obese woman, seated in wheelchair, in no evident distress Head: Head normocephalic and atraumatic.  Oropharynx difficult to examine but appears benign. Neck: Supple Cardiovascular: Regular rate and rhythm, no murmurs. Has edema in her hands, lower legs and feet. Respiratory: Breath sounds clear to auscultation Musculoskeletal: No obvious deformities or scoliosis. Has increased tone and lymphedema in her legs Skin: No rashes or neurocutaneous lesions  Neurologic Exam Mental Status: Awake and fully alert.  Has no language. Claps her hands frequently. Tolerant of invasions into her space Cranial Nerves: Fundoscopic exam reveals sharp disc margins.  Pupils equal, briskly reactive to light. Turns to localize faces, objects and sounds in the periphery. Facial  movements are symmetric. Motor: Normal functional bulk, tone and strength in the upper extremities. Increased tone in the lower extremities. Has outstretched hand tremor Sensory: Withdrawal x 4 Coordination: Unable to adequately assess due to her inability to participate in examination. Mild dysmetria when reaching for objects.  Gait and Station: Unable to stand and bear weight.  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 No recent diagnostic studies to be reviewed with the patient. She has had 2 seizures recently in the setting of illness. I recommended no change to her treatment plan at this time.  Plan until next visit: Continue medications as prescribed Call for questions or concerns Return in about 6 months (around 03/01/2023).  The medication list was reviewed and reconciled. No changes were made in the prescribed medications today. A complete medication list was provided to the patient.   Allergies as of 08/29/2022       Reactions   Carbamazepine Rash        Medication List        Accurate as of August 29, 2022  8:02 AM. If you have any questions, ask your nurse or doctor.          acetaminophen 650 MG suppository Commonly known as: TYLENOL Place 650 mg rectally every 8 (eight) hours as needed for mild pain.   acetaminophen 500 MG tablet Commonly known as: TYLENOL Take 500 mg by mouth every 6 (six) hours as needed for fever.   albuterol (2.5 MG/3ML) 0.083% nebulizer solution Commonly known as: PROVENTIL Take 3 mLs (2.5 mg total) by nebulization every 6 (six) hours as needed for wheezing or shortness of breath.   Banzel 40 MG/ML  Susp Generic drug: Rufinamide TAKE 5MLS EVERY MORNING AND 5MLS EVERY NIGHT AT BEDTIME   Calcarb 600/D 600-10 MG-MCG Tabs Generic drug: Calcium Carbonate-Vitamin D Take 1 tablet by mouth once a week.   cetirizine HCl 1 MG/ML solution Commonly known as: ZYRTEC TAKE 10 ML(10 MG) BY MOUTH DAILY   Depakote Sprinkles 125 MG capsule Generic drug: divalproex TAKE 8 CAPSULES BY MOUTH TWICE DAILY   diazepam 20 MG Gel Commonly known as: Diastat AcuDial DIAL TO 15 MG, GIVE RECTALLY FOR SEIZURES LASTING 2 MINUTES OR LONGER   Valtoco 20 MG Dose 2 x 10 MG/0.1ML Lqpk Generic drug: diazePAM (20 MG Dose) Give 1 spray in each nostril for seizures lasting 2 minutes or longer.   fluticasone 50 MCG/ACT nasal spray Commonly known as: FLONASE SHAKE LIQUID AND USE 2 SPRAYS IN EACH NOSTRIL TWICE DAILY   furosemide 20 MG tablet Commonly known as: LASIX TAKE 1 TABLET BY MOUTH EVERY DAY AS NEEDED FOR SWELLING   Keppra 100 MG/ML solution Generic drug: levETIRAcetam TAKE 12 MLS BY MOUTH MORNING EVERY MORNING AND AT NIGHT   ketotifen 0.025 % ophthalmic solution Commonly known as: ZADITOR Apply 1 drop to eye 2 (two) times daily.   lactose free nutrition Liqd Take 237 mLs by mouth 3 (three) times daily with meals.   lactulose 10 GM/15ML solution Commonly known as: CHRONULAC TAKE 30 ML BY MOUTH TWICE DAILY   levOCARNitine 1 GM/10ML solution Commonly known as: CARNITOR TAKE 5 ML BY MOUTH THREE TIMES DAILY   liver oil-zinc oxide 40 % ointment Commonly known as: DESITIN Apply 1 application topically daily.   Nebulizer Mask Adult Misc Use with nebulizer machine when needed   Nebulizer/Pediatric Mask Kit Use with nebulizer machine as needed for intermittent wheezing and shortness of breath.   nystatin powder Commonly known as: nystatin APPLY TO THE AFFECTED AREA WITH EACH DIAPER CHANGE UNTIL RASH IS GONE   pantoprazole sodium 40 mg Commonly known as: PROTONIX MIX 1 PACK AND TAKE DAILY AS  DIRECTED   promethazine-dextromethorphan 6.25-15 MG/5ML syrup Commonly known as: PROMETHAZINE-DM Take 5 mLs by mouth 4 (four) times daily as needed for cough.   Topamax Sprinkle 25 MG capsule Generic drug: topiramate TAKE 6 CAPSULES BY MOUTH EVERY MORNING AND IN THE EVENING WITH FOOD   VITAMIN D PO Take by mouth.      Total time spent with the patient was 20 minutes, of which 50% or more was spent in counseling and coordination of care.  Rockwell Germany NP-C [facility] [id number], [place], [place] [address] Ph. [phone] Fax [phone]

## 2022-08-29 NOTE — Patient Instructions (Signed)
It was a pleasure to see you today!  Instructions for you until your next appointment are as follows: Continue Catalyna's medications as prescribed Call if she has seizures or if you have other concerns Please sign up for MyChart if you have not done so. Please plan to return for follow up in 6 months or sooner if needed.   Feel free to contact our office during normal business hours at [phone] with questions or concerns. If there is no answer or the call is outside business hours, please leave a message and our clinic staff will call you back within the next business day.  If you have an urgent concern, please stay on the line for our after-hours answering service and ask for the on-call neurologist.     I also encourage you to use MyChart to communicate with me more directly. If you have not yet signed up for MyChart within [facility], the front desk staff can help you. However, please note that this inbox is NOT monitored on nights or weekends, and response can take up to 2 business days.  Urgent matters should be discussed with the on-call pediatric neurologist.   At [facility], we are committed to providing exceptional care. You will receive a patient satisfaction survey through text or email regarding your visit today. Your opinion is important to me. Comments are appreciated.

## 2022-09-01 NOTE — Telephone Encounter (Signed)
Spoke to pt mom. Equipment has been delivered.Ottis Stain, CMA

## 2022-09-11 ENCOUNTER — Telehealth (INDEPENDENT_AMBULATORY_CARE_PROVIDER_SITE_OTHER): Payer: Self-pay | Admitting: Family

## 2022-09-11 DIAGNOSIS — G40319 Generalized idiopathic epilepsy and epileptic syndromes, intractable, without status epilepticus: Secondary | ICD-10-CM

## 2022-09-11 DIAGNOSIS — Z79899 Other long term (current) drug therapy: Secondary | ICD-10-CM

## 2022-09-11 DIAGNOSIS — R251 Tremor, unspecified: Secondary | ICD-10-CM

## 2022-09-11 NOTE — Telephone Encounter (Signed)
Mom came in to report that Michele [name]'s tremors are worse and that she wants to check drug levels. I sent orders to [facility] to be done tomorrow. TG

## 2022-09-12 ENCOUNTER — Emergency Department (HOSPITAL_COMMUNITY): Payer: Medicare Other

## 2022-09-12 ENCOUNTER — Emergency Department (HOSPITAL_COMMUNITY)
Admission: EM | Admit: 2022-09-12 | Discharge: 2022-09-12 | Disposition: A | Discharge status: Home / Self Care | Payer: Medicare Other | Attending: Emergency Medicine | Admitting: Emergency Medicine

## 2022-09-12 ENCOUNTER — Other Ambulatory Visit: Payer: Self-pay

## 2022-09-12 DIAGNOSIS — R600 Localized edema: Secondary | ICD-10-CM | POA: Insufficient documentation

## 2022-09-12 DIAGNOSIS — Z1152 Encounter for screening for COVID-19: Secondary | ICD-10-CM | POA: Diagnosis not present

## 2022-09-12 DIAGNOSIS — G251 Drug-induced tremor: Secondary | ICD-10-CM | POA: Diagnosis not present

## 2022-09-12 DIAGNOSIS — R0602 Shortness of breath: Secondary | ICD-10-CM | POA: Diagnosis not present

## 2022-09-12 DIAGNOSIS — R251 Tremor, unspecified: Secondary | ICD-10-CM | POA: Diagnosis present

## 2022-09-12 DIAGNOSIS — T426X5A Adverse effect of other antiepileptic and sedative-hypnotic drugs, initial encounter: Secondary | ICD-10-CM | POA: Diagnosis not present

## 2022-09-12 LAB — CBC WITH DIFFERENTIAL/PLATELET
Abs Immature Granulocytes: 0.46 10*3/uL — ABNORMAL HIGH (ref 0.00–0.07)
Basophils Absolute: 0.1 10*3/uL (ref 0.0–0.1)
Basophils Relative: 1 %
Eosinophils Absolute: 0.1 10*3/uL (ref 0.0–0.5)
Eosinophils Relative: 1 %
HCT: 48.4 % — ABNORMAL HIGH (ref 36.0–46.0)
Hemoglobin: 15 g/dL (ref 12.0–15.0)
Immature Granulocytes: 4 %
Lymphocytes Relative: 35 %
Lymphs Abs: 4.4 10*3/uL — ABNORMAL HIGH (ref 0.7–4.0)
MCH: 34.6 pg — ABNORMAL HIGH (ref 26.0–34.0)
MCHC: 31 g/dL (ref 30.0–36.0)
MCV: 111.5 fL — ABNORMAL HIGH (ref 80.0–100.0)
Monocytes Absolute: 1.4 10*3/uL — ABNORMAL HIGH (ref 0.1–1.0)
Monocytes Relative: 12 %
Neutro Abs: 6 10*3/uL (ref 1.7–7.7)
Neutrophils Relative %: 47 %
Platelets: 134 10*3/uL — ABNORMAL LOW (ref 150–400)
RBC: 4.34 MIL/uL (ref 3.87–5.11)
RDW: 16.1 % — ABNORMAL HIGH (ref 11.5–15.5)
WBC: 12.5 10*3/uL — ABNORMAL HIGH (ref 4.0–10.5)
nRBC: 2.6 % — ABNORMAL HIGH (ref 0.0–0.2)

## 2022-09-12 LAB — COMPREHENSIVE METABOLIC PANEL
ALT: 11 U/L (ref 0–44)
AST: 21 U/L (ref 15–41)
Albumin: 2.6 g/dL — ABNORMAL LOW (ref 3.5–5.0)
Alkaline Phosphatase: 63 U/L (ref 38–126)
Anion gap: 9 (ref 5–15)
BUN: 12 mg/dL (ref 6–20)
CO2: 25 mmol/L (ref 22–32)
Calcium: 9.1 mg/dL (ref 8.9–10.3)
Chloride: 106 mmol/L (ref 98–111)
Creatinine, Ser: 0.98 mg/dL (ref 0.44–1.00)
GFR, Estimated: >60 mL/min (ref >60)
Glucose, Bld: 87 mg/dL (ref 70–99)
Potassium: 3.7 mmol/L (ref 3.5–5.1)
Sodium: 140 mmol/L (ref 135–145)
Total Bilirubin: 0.4 mg/dL (ref 0.3–1.2)
Total Protein: 6.5 g/dL (ref 6.5–8.1)

## 2022-09-12 LAB — MAGNESIUM: Magnesium: 2.5 mg/dL — ABNORMAL HIGH (ref 1.7–2.4)

## 2022-09-12 LAB — RESP PANEL BY RT-PCR (RSV, FLU A&B, COVID)  RVPGX2
Influenza A by PCR: NEGATIVE
Influenza B by PCR: NEGATIVE
Resp Syncytial Virus by PCR: NEGATIVE
SARS Coronavirus 2 by RT PCR: NEGATIVE

## 2022-09-12 LAB — AMMONIA: Ammonia: 35 umol/L (ref 9–35)

## 2022-09-12 LAB — VALPROIC ACID LEVEL: Valproic Acid Lvl: 122 ug/mL — ABNORMAL HIGH (ref 50.0–100.0)

## 2022-09-12 MED ORDER — AMOXICILLIN-POT CLAVULANATE 875-125 MG PO TABS: 1.0000 | ORAL_TABLET | Freq: Two times a day (BID) | ORAL | 0 refills | Status: DC

## 2022-09-12 NOTE — ED Triage Notes (Signed)
Pt with shob and O2 sats in the high 80s even after nebs at home with mom.  Mom states pt has had increase in tremors and was due to have levels checked for seizure meds tomorrow but d/t her increasing shob she felt she should bring her in tonight.  Pt is non verbal.  Mom reports covid diagnosis in February and is concerned either the pt has "fluid" or either had pneumonia.

## 2022-09-12 NOTE — ED Provider Notes (Signed)
[facility] Provider Note   CSN: [id number] Arrival date & time: 09/12/22  0031     History  Chief Complaint  Patient presents with   Shortness of Breath    Michele [name] is a 37 y.o. female.  The history is provided by a parent and medical records.  Shortness of Breath Michele [name] is a 37 y.o. female who presents to the Emergency Department complaining of difficulty breathing.  She presents to the emergency department accompanied by her mother for evaluation of difficulty breathing that started about 3 days ago.  Mother reports increased nasal drainage, gurgling respirations and coughing but she cannot clear her cough.  Mother also incidentally reports that she has been twitching for several weeks and has been seen by neurology with plan to have her seizure medicines checked tomorrow on outpatient labs.  She is nonverbal at baseline and has 2 L.  Oxygen available at home.  She has a history of CP, lymphedema.  No history of cardiac disease, diabetes, DVT/PE.     Home Medications Prior to Admission medications   Medication Sig Start Date End Date Taking? Authorizing Provider  amoxicillin-clavulanate (AUGMENTIN) 875-125 MG tablet Take 1 tablet by mouth every 12 (twelve) hours. 09/12/22  Yes Quintella Reichert, MD  acetaminophen (TYLENOL) 500 MG tablet Take 500 mg by mouth every 6 (six) hours as needed for fever. Patient not taking: Reported on 08/29/2022    [provider]  acetaminophen (TYLENOL) 650 MG suppository Place 650 mg rectally every 8 (eight) hours as needed for mild pain.    [provider]  albuterol (PROVENTIL) (2.5 MG/3ML) 0.083% nebulizer solution Take 3 mLs (2.5 mg total) by nebulization every 6 (six) hours as needed for wheezing or shortness of breath. 08/22/22   Ezequiel Essex, MD  BANZEL 40 MG/ML SUSP TAKE 5MLS EVERY MORNING AND 5MLS EVERY NIGHT AT BEDTIME 08/10/22   Rockwell Germany, NP  Calcium  Carbonate-Vitamin D (CALCARB 600/D) 600-400 MG-UNIT tablet Take 1 tablet by mouth once a week.    [provider]  cetirizine HCl (ZYRTEC) 1 MG/ML solution TAKE 10 ML(10 MG) BY MOUTH DAILY 07/21/22   Ezequiel Essex, MD  DEPAKOTE SPRINKLES 125 MG capsule TAKE 8 CAPSULES BY MOUTH TWICE DAILY 08/24/22   Rockwell Germany, NP  diazepam (DIASTAT ACUDIAL) 20 MG GEL DIAL TO 15 MG, GIVE RECTALLY FOR SEIZURES LASTING 2 MINUTES OR LONGER 09/19/21   Rockwell Germany, NP  fluconazole (DIFLUCAN) 150 MG tablet Take by mouth. Patient not taking: Reported on 08/29/2022 08/22/22   [provider]  fluticasone (FLONASE) 50 MCG/ACT nasal spray SHAKE LIQUID AND USE 2 SPRAYS IN EACH NOSTRIL TWICE DAILY 05/22/22   Ezequiel Essex, MD  furosemide (LASIX) 20 MG tablet TAKE 1 TABLET BY MOUTH EVERY DAY AS NEEDED FOR SWELLING 05/02/21   Carollee Leitz, MD  KEPPRA 100 MG/ML solution TAKE 12 MLS BY MOUTH MORNING EVERY MORNING AND AT NIGHT 08/10/22   Rockwell Germany, NP  ketotifen (ZADITOR) 0.025 % ophthalmic solution Apply 1 drop to eye 2 (two) times daily. 08/22/21   [provider]  lactose free nutrition (BOOST PLUS) LIQD Take 237 mLs by mouth 3 (three) times daily with meals. 03/14/22   Zola Button, MD  lactulose [facility] 10 GM/15ML solution TAKE 30 ML BY MOUTH TWICE DAILY 12/28/21   Ezequiel Essex, MD  levOCARNitine (CARNITOR) 1 GM/10ML solution TAKE 5 ML BY MOUTH THREE TIMES DAILY 08/10/22   Rockwell Germany, NP  liver oil-zinc oxide (DESITIN) 40 % ointment Apply 1 application topically daily.    [provider]  nystatin powder APPLY TO THE AFFECTED AREA WITH EACH DIAPER CHANGE UNTIL RASH IS GONE 06/27/21   Sheela Stack, MD  pantoprazole sodium (PROTONIX) 40 mg MIX 1 PACK AND TAKE DAILY AS DIRECTED 12/21/21   Ezequiel Essex, MD  promethazine-dextromethorphan (PROMETHAZINE-DM) 6.25-15 MG/5ML syrup Take 5 mLs by mouth 4 (four) times daily as needed for cough. Patient not taking: Reported on  08/29/2022 08/07/22   Barrett Henle, MD  Respiratory Therapy Supplies (NEBULIZER MASK ADULT) MISC Use with nebulizer machine when needed 07/21/22   Ezequiel Essex, MD  Respiratory Therapy Supplies (NEBULIZER/PEDIATRIC MASK) KIT Use with nebulizer machine as needed for intermittent wheezing and shortness of breath. 08/22/22   Ezequiel Essex, MD  TOPAMAX SPRINKLE 25 MG capsule TAKE 6 CAPSULES BY MOUTH EVERY MORNING AND IN THE EVENING WITH FOOD 08/10/22   Rockwell Germany, NP  VALTOCO 20 MG DOSE 10 MG/0.1ML LQPK Give 1 spray in each nostril for seizures lasting 2 minutes or longer. 08/10/22   Rockwell Germany, NP  VITAMIN D PO Take by mouth.    [provider]      Allergies    Carbamazepine    Review of Systems   Review of Systems  Respiratory:  Positive for shortness of breath.   All other systems reviewed and are negative.   Physical Exam Updated Vital Signs BP 118/79 (BP Location: Left Arm)   Pulse (!) 104   Temp 98.9 F (37.2 C) (Temporal)   Resp 18   SpO2 92%  Physical Exam Vitals and nursing note reviewed.  Constitutional:      Appearance: She is well-developed.  HENT:     Head: Atraumatic.     Comments: Bilateral rhinorrhea Cardiovascular:     Rate and Rhythm: Regular rhythm. Tachycardia present.     Heart sounds: No murmur heard. Pulmonary:     Effort: Pulmonary effort is normal. No respiratory distress.     Breath sounds: Normal breath sounds.  Abdominal:     Palpations: Abdomen is soft.     Tenderness: There is no abdominal tenderness. There is no guarding or rebound.  Musculoskeletal:        General: No tenderness.     Comments: Nonpitting edema to bilateral lower extremities  Skin:    General: Skin is warm and dry.  Neurological:     Mental Status: She is alert.     Comments: Nonverbal.  Looks about the room.  She does have a tremor at times to bilateral upper extremities.  She has paralysis of bilateral lower extremities.  Equal and strong grip  strength bilaterally.  Psychiatric:     Comments: Unable to assess     ED Results / Procedures / Treatments   Labs (all labs ordered are listed, but only abnormal results are displayed) Labs Reviewed  CBC WITH DIFFERENTIAL/PLATELET - Abnormal; Notable for the following components:      Result Value   WBC 12.5 (*)    HCT 48.4 (*)    MCV 111.5 (*)    MCH 34.6 (*)    RDW 16.1 (*)    Platelets 134 (*)    nRBC 2.6 (*)    Lymphs Abs 4.4 (*)    Monocytes Absolute 1.4 (*)    Abs Immature Granulocytes 0.46 (*)    All other components within normal limits  VALPROIC ACID LEVEL - Abnormal; Notable for the following  components:   Valproic Acid Lvl 122 (*)    All other components within normal limits  COMPREHENSIVE METABOLIC PANEL - Abnormal; Notable for the following components:   Albumin 2.6 (*)    All other components within normal limits  MAGNESIUM - Abnormal; Notable for the following components:   Magnesium 2.5 (*)    All other components within normal limits  RESP PANEL BY RT-PCR (RSV, FLU A&B, COVID)  RVPGX2  AMMONIA  TOPIRAMATE LEVEL    EKG EKG Interpretation  Date/Time:  Tuesday September 12 2022 01:07:39 EDT Ventricular Rate:  80 PR Interval:  112 QRS Duration: 74 QT Interval:  354 QTC Calculation: 408 R Axis:   35 Text Interpretation: Normal sinus rhythm Possible Lateral infarct , age undetermined Inferior infarct , age undetermined Abnormal ECG No significant change since last tracing Confirmed by Quintella Reichert [phone]) on 09/12/2022 3:04:36 AM  Radiology DG Chest 2 View  Result Date: 09/12/2022 CLINICAL DATA:  Shortness of breath and cough.  Recent COVID. EXAM: CHEST - 2 VIEW COMPARISON:  11/08/2021 FINDINGS: Shallow inspiration. Cardiac enlargement. No vascular congestion or edema. Lungs are clear. No pleural effusions. No pneumothorax. Mediastinal contours appear intact. IMPRESSION: Cardiac enlargement.  No evidence of active pulmonary disease. Electronically Signed    By: Lucienne Capers M.D.   On: 09/12/2022 02:11    Procedures Procedures    Medications Ordered in ED Medications - No data to display  ED Course/ Medical Decision Making/ A&P                             Medical Decision Making Amount and/or Complexity of Data Reviewed Labs: ordered.  Risk Prescription drug management.   Patient with history of CP, seizure disorder here for evaluation of increased shortness of breath for the last 3 days with cough, nasal congestion.  She did have sats of 88% on room air at time of ED arrival but she is on as needed oxygen at home.  She has no respiratory distress on examination.  Lungs are clear.  Chest x-ray is without infiltrate.  CBC does have mild leukocytosis.  Will treat with Augmentin for possible early pneumonia.  Mother also comments on the patient's tremor, this has been present for several weeks.  She is on multiple seizure medications including Depakote.  Depakote level did return elevated in the emergency department.  Ammonia is within normal limits.  Discussed with on-call neurologist-recommendation to hold 1 dose of Depakote and return to 6 capsules twice daily pending further recommendations from her primary neurologist.  Feel Emera is stable at this point for discharge home with close outpatient follow-up and return precautions.  Current clinical picture is not consistent with PE, sepsis, acute CHF.        Final Clinical Impression(s) / ED Diagnoses Final diagnoses:  SOB (shortness of breath)  Valproic acid-induced tremor    Rx / DC Orders ED Discharge Orders          Ordered    amoxicillin-clavulanate (AUGMENTIN) 875-125 MG tablet  Every 12 hours        09/12/22 0610              Quintella Reichert, MD 09/12/22 [phone]

## 2022-09-12 NOTE — ED Notes (Signed)
Patient was transferred from wheel chair to bed with help of mother and tech. Patient is non verbal wheel chair bound oxygen dependent and has been ill for several days. MOM brought her in for worsening sob and cold symptoms. Pt has visible thick cream colored nasal drainage present in both nostrils a low grade fever and tachycardia. Labs drawn and sent along with nasal swab to r/o covid rsv and flu. Mom very supportive at bedside. Pt pending md evaluation and diagnostic results

## 2022-09-12 NOTE — ED Notes (Signed)
Still unable to get pt to allow vs be taken discussed with MD she is ok with leaving pt alone

## 2022-09-12 NOTE — ED Provider Triage Note (Signed)
Emergency Medicine Provider Triage Evaluation Note  Michele [name] , a 37 y.o. female  was evaluated in triage.  Pt complains of shortness of breath. Patient with developmental delay, history provided by mom at bedside. States she has O2 PRN at home, does not normally need it. 3 days ago started satting in the 80s and mom placed on oxygen. She has also been more tremulous and mom called her neurologist to set her up to have her levels of seizure meds checked tomorrow.  Mom is concerned for pneumonia as patient has history of same.  She states that the patient has been "gurgling" and that is normally what happens when she catches pneumonia. Patient currently on 2L O2  Review of Systems  Positive:  Negative:   Physical Exam  BP 113/80   Pulse (!) 114   Temp 99.1 F (37.3 C) (Axillary)   Resp 17   SpO2 96%  Gen:   Awake, no distress   Resp:  Normal effort  MSK:   Moves extremities without difficulty Other:    Medical Decision Making  Medically screening exam initiated at 1:38 AM.  Appropriate orders placed.  Michele [name] was informed that the remainder of the evaluation will be completed by another provider, this initial triage assessment does not replace that evaluation, and the importance of remaining in the ED until their evaluation is complete.     Bud Face, PA-C 09/12/22 0140

## 2022-09-12 NOTE — ED Notes (Signed)
Have made multiple attempts to get vital signs on pt she is not tolerating it will attempt again later

## 2022-09-13 LAB — TOPIRAMATE LEVEL: Topiramate Lvl: 21.5 ug/mL (ref 2.0–25.0)

## 2022-09-15 LAB — CBC WITH DIFFERENTIAL/PLATELET
Absolute Monocytes: 1194 cells/uL — ABNORMAL HIGH (ref 200–950)
Basophils Absolute: 114 cells/uL (ref 0–200)
Basophils Relative: 0.9 %
Eosinophils Absolute: 76 cells/uL (ref 15–500)
Eosinophils Relative: 0.6 %
HCT: 44.1 % (ref 35.0–45.0)
Hemoglobin: 14.3 g/dL (ref 11.7–15.5)
Lymphs Abs: 4966 cells/uL — ABNORMAL HIGH (ref 850–3900)
MCH: 33.4 pg — ABNORMAL HIGH (ref 27.0–33.0)
MCHC: 32.4 g/dL (ref 32.0–36.0)
MCV: 103 fL — ABNORMAL HIGH (ref 80.0–100.0)
MPV: 11.4 fL (ref 7.5–12.5)
Monocytes Relative: 9.4 %
Neutro Abs: 6350 cells/uL (ref 1500–7800)
Neutrophils Relative %: 50 %
Platelets: 139 10*3/uL — ABNORMAL LOW (ref 140–400)
RBC: 4.28 10*6/uL (ref 3.80–5.10)
RDW: 14.2 % (ref 11.0–15.0)
Total Lymphocyte: 39.1 %
WBC: 12.7 10*3/uL — ABNORMAL HIGH (ref 3.8–10.8)

## 2022-09-15 LAB — COMPREHENSIVE METABOLIC PANEL
AG Ratio: 0.9 (calc) — ABNORMAL LOW (ref 1.0–2.5)
ALT: 8 U/L (ref 6–29)
AST: 17 U/L (ref 10–30)
Albumin: 3.4 g/dL — ABNORMAL LOW (ref 3.6–5.1)
Alkaline phosphatase (APISO): 71 U/L (ref 31–125)
BUN: 16 mg/dL (ref 7–25)
CO2: 30 mmol/L (ref 20–32)
Calcium: 9.9 mg/dL (ref 8.6–10.2)
Chloride: 107 mmol/L (ref 98–110)
Creat: 0.93 mg/dL (ref 0.50–0.97)
Globulin: 3.8 g/dL (calc) — ABNORMAL HIGH (ref 1.9–3.7)
Glucose, Bld: 83 mg/dL (ref 65–99)
Potassium: 3.9 mmol/L (ref 3.5–5.3)
Sodium: 147 mmol/L — ABNORMAL HIGH (ref 135–146)
Total Bilirubin: 0.3 mg/dL (ref 0.2–1.2)
Total Protein: 7.2 g/dL (ref 6.1–8.1)

## 2022-09-15 LAB — VALPROIC ACID LEVEL: Valproic Acid Lvl: 85 mg/L (ref 50.0–100.0)

## 2022-09-15 LAB — TOPIRAMATE LEVEL: Topiramate Lvl: 18.4 ug/mL

## 2022-09-19 ENCOUNTER — Encounter (INDEPENDENT_AMBULATORY_CARE_PROVIDER_SITE_OTHER): Payer: Self-pay | Admitting: Family

## 2022-09-19 ENCOUNTER — Other Ambulatory Visit (INDEPENDENT_AMBULATORY_CARE_PROVIDER_SITE_OTHER): Payer: Self-pay

## 2022-09-19 ENCOUNTER — Ambulatory Visit (INDEPENDENT_AMBULATORY_CARE_PROVIDER_SITE_OTHER): Payer: Medicare Other | Admitting: Family

## 2022-09-19 VITALS — BP 126/76 | Ht 59.0 in | Wt 210.0 lb

## 2022-09-19 DIAGNOSIS — I890 Lymphedema, not elsewhere classified: Secondary | ICD-10-CM

## 2022-09-19 DIAGNOSIS — D72829 Elevated white blood cell count, unspecified: Secondary | ICD-10-CM | POA: Insufficient documentation

## 2022-09-19 DIAGNOSIS — G40814 Lennox-Gastaut syndrome, intractable, without status epilepticus: Secondary | ICD-10-CM

## 2022-09-19 DIAGNOSIS — G40319 Generalized idiopathic epilepsy and epileptic syndromes, intractable, without status epilepticus: Secondary | ICD-10-CM | POA: Diagnosis not present

## 2022-09-19 DIAGNOSIS — Z79899 Other long term (current) drug therapy: Secondary | ICD-10-CM | POA: Insufficient documentation

## 2022-09-19 DIAGNOSIS — E8809 Other disorders of plasma-protein metabolism, not elsewhere classified: Secondary | ICD-10-CM

## 2022-09-19 DIAGNOSIS — D72828 Other elevated white blood cell count: Secondary | ICD-10-CM

## 2022-09-19 DIAGNOSIS — G40909 Epilepsy, unspecified, not intractable, without status epilepticus: Secondary | ICD-10-CM

## 2022-09-19 DIAGNOSIS — F73 Profound intellectual disabilities: Secondary | ICD-10-CM

## 2022-09-19 DIAGNOSIS — R251 Tremor, unspecified: Secondary | ICD-10-CM

## 2022-09-19 MED ORDER — DEPAKOTE SPRINKLES 125 MG PO CSDR: DELAYED_RELEASE_CAPSULE | ORAL | 5 refills | Status: DC

## 2022-09-19 NOTE — Progress Notes (Signed)
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 She is taking and tolerating brand Depakote, Keppra, Topamax and Banzel for her seizure disorder. Emalynn typically turns blue and requires blow by oxygen when seizures occur. She is completely dependent upon others for all activities of daily living. She is cared for at home by her mother.   Due to her medical condition, she is indefinitely incontinent of stool and urine.  It is medically necessary for her to use diapers, underpads, and gloves to assist with hygiene and skin integrity.      Today's concerns: Michele [name] is seen today because her mother has reported that she has been having tremors. She reports that she had mild tremors in the past but that they worsened recently and "looked like Parkinson's disease". Mom notes that the tremors have lessened over the last few days but are still present.  Was recently seen in the ED on 09/12/2022 for respiratory infection and a non-trough Depakote level was drawn. The level was elevated and Mom was given instructions to reduce the dose. She was concerned about seizure breakthrough and I gave her instructions to give 875mg  twice per day. Mom reports today that Michele [name] has tolerated the dose and has not had breakthrough seizures. Her last seizure occurred in March.  A Topiramate level was collected a few days after the ED visit and was therapeutic. A Depakote level drawn at that time was also therapeutic, however the Depakote dose had been reduced for only 24 hours at  the time of the lab draw.  When labs were collected at the ED, the WBC was elevated. The albumin was slightly low. Mom reports that Michele [name] has been coughing some since the ED visit but that it is from allergy drainage.  Mom reports that Michele [name] consumes a varied diet of table foods. She also give her Boost supplements at times.  Minica has been otherwise generally healthy since she was last seen. No health concerns today other than previously mentioned.  Review of systems: Please see HPI for neurologic and other pertinent review of systems. Otherwise all other systems were reviewed and were negative.  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[place]) 12/24/2012   Morbid obesity ([place]) 12/24/2012   Congenital quadriplegia 12/24/2012   URINARY INCONTINENCE 02/25/2010   Full incontinence of feces 02/25/2010   Hypothyroidism 01/29/2009   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   Cerebral palsy 01/29/2009   Osteoporosis 01/29/2009   Seizure disorder 01/29/2009     Past Medical History:  Diagnosis Date   Acute hypoxemic respiratory failure due to COVID-19 08/03/2019   Cerebral palsy    Hypothyroidism    Lennox-Gastaut syndrome    Osteoporosis    Pneumonia  Poor appetite 02/21/2021   Profound mental retardation    Reflux    Rickets    Seizures    Upper respiratory symptom 06/02/2019    Past medical history comments: See HPI  Surgical history: Past Surgical History:  Procedure Laterality Date   FOOT MASS EXCISION Right    Gastrocutaneous fistual  repair  2012   PEG TUBE PLACEMENT     PEG TUBE REMOVAL  2012 ?     Family history: family history includes Cancer in her maternal aunt; Diabetes in an other family member; Heart disease in her maternal grandmother and another family member; Hypertension in her mother and paternal grandmother; Kidney disease in an other family member; Stroke in her maternal grandfather.   Social history: Social History   Socioeconomic History   Marital status: Single    Spouse name: Not on file   Number of children: 0   Years of education: Not on file   Highest education level: Not on file  Occupational History   Occupation: Disabled   Tobacco Use   Smoking status: Never    Passive exposure: Never   Smokeless tobacco: Never  Vaping Use   Vaping Use: Never used  Substance and Sexual Activity   Alcohol use: No   Drug use: No   Sexual activity: Never  Other Topics Concern   Not on file  Social History Narrative   0 caffeine drinks    Lives with mother who helps ADLs.   Graduated from [employer] in 2010. Now attends day program at [employer].    Enjoys playing in water, ripping paper, watching TV, music and going out.   Social Determinants of Health   Financial Resource Strain: Not on file  Food Insecurity: Not on file  Transportation Needs: Not on file  Physical Activity: Not on file  Stress: Not on file  Social Connections: Not on file  Intimate Partner Violence: Not on file    Past/failed meds: Copied from previous record: Carbamazepine - rash  Allergies: Allergies  Allergen Reactions   Carbamazepine Rash    Immunizations: Immunization History  Administered Date(s) Administered   Influenza Split 04/14/2011, 03/22/2012   Influenza Whole 05/26/2009, 04/04/2010   Influenza,inj,Quad PF,6+ Mos 04/03/2013, 04/04/2014, 03/08/2015, 03/08/2016, 03/26/2017, 04/12/2018, 03/03/2019, 03/26/2020, 02/18/2021, 07/21/2022   PFIZER(Purple Top)SARS-COV-2 Vaccination 11/10/2019, 12/01/2019,  05/28/2020   Pfizer Covid-19 Vaccine Bivalent Booster 25yrs & up 05/31/2021   Tdap 01/14/2014    Diagnostics/Screenings: Copied from previous record:   Physical Exam: BP 126/76 (BP Location: Right Arm, Patient Position: Sitting, Cuff Size: Large)   Ht 4\' 11"  (1.499 m)   Wt 210 lb (95.3 kg)   LMP 08/19/2022 (Approximate)   BMI 42.41 kg/m   General: well developed, well nourished woman, seated in wheelchair, in no evident distress Head: normocephalic and atraumatic. Oropharynx difficult to examine or appears benign. No dysmorphic features. Neck: supple Cardiovascular: regular rate and rhythm, no murmurs. Respiratory: clear to auscultation bilaterally Abdomen: bowel sounds present all four quadrants, abdomen soft, non-tender, non-distended.  Musculoskeletal: no skeletal deformities or obvious scoliosis. Has increased tone and lymphedema in her lower extremities Skin: no rashes or neurocutaneous lesions  Neurologic Exam Mental Status: awake and fully alert. Has no language.  Smiles responsively. Claps her hands frequently. Unable to follow instructions or participate in examination Cranial Nerves: fundoscopic exam - red reflex present.  Unable to fully visualize fundus.  Pupils equal briskly reactive to light.  Turns to localize faces and objects in the periphery. Turns to  localize sounds in the periphery. Facial movements are symmetric. Motor: spastic diplegia Sensory: withdrawal x 4 Coordination: unable to adequately assess due to patient's inability to participate in examination. No dysmetria when reaching for objects. Gait and Station: unable to independently stand and bear weight. Able to stand with assistance but needs constant support. Able to pivot but not take steps. Mom uses a [employer] fast streamer at home.  Reflexes: diminished and symmetric. Toes neutral. 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[place]) - Plan: EEG Child, Valproic acid level, CBC with Differential/Platelet, Comprehensive metabolic panel, Comprehensive metabolic panel, CBC with Differential/Platelet, Valproic acid level, DEPAKOTE SPRINKLES 125 MG capsule  Lymphedema - Plan: EEG Child, Valproic acid level, CBC with Differential/Platelet, Comprehensive metabolic panel, Comprehensive metabolic panel, CBC with Differential/Platelet, Valproic acid level  Profound intellectual disabilities - Plan: EEG Child, Valproic acid level, CBC with Differential/Platelet, Comprehensive metabolic panel, Comprehensive metabolic panel, CBC with Differential/Platelet, Valproic acid level  Tremor of both hands - Plan: EEG Child, Valproic acid level, CBC with Differential/Platelet, Comprehensive metabolic panel, Comprehensive metabolic panel, CBC with Differential/Platelet, Valproic acid level  Hypoalbuminemia - Plan: EEG Child, Valproic acid level, CBC with Differential/Platelet, Comprehensive metabolic panel, Comprehensive metabolic panel, CBC with Differential/Platelet, Valproic acid level  Encounter for long-term (current) use of high-risk medication - Plan: EEG Child, Valproic acid level, CBC with Differential/Platelet, Comprehensive metabolic panel, Comprehensive metabolic panel, CBC with Differential/Platelet, Valproic acid level  Other elevated white blood cell (WBC) count - Plan: EEG Child, Valproic acid level, CBC with Differential/Platelet, Comprehensive metabolic panel, Comprehensive metabolic panel, CBC with Differential/Platelet, Valproic  acid level   Recommendations for plan of care: The patient's previous Epic records were reviewed. I reviewed the recent lab studies with the patient's mother. I am concerned about the Depakote being an etiology for the tremors and talked with Mom about gradually reducing the Depakote dose to see if tremors improve or resolve. We may need to add another medication to her regimen if seizures are problematic.   Plan until next visit: EEG ordered as it has been many years since an EEG was performed. This will help with choosing another medication for Arantza if the Depakote is tapered and discontinued. Labs ordered to recheck CBC, CMP and Depakote level. I may consider reducing the Depakote dose after the level has been obtained. If the dose is reduced further, I will recheck it every 2 weeks until the tremors improve or the medication is discontinued Continue other medications as prescribed for now Call if seizures occur or if tremors worsen Remini is scheduled for a revisit in September, but may need to be seen earlier if changes are made in her treatment plan.   The medication list was reviewed and reconciled. No changes were made in the prescribed medications today. A complete medication list was provided to the patient.  Orders Placed This Encounter  Procedures   Valproic acid level    Must be drawn in the early morning before his first dose of medication of the day    Standing Status:   Future    Number of Occurrences:   1    Standing Expiration Date:   09/19/2023  CBC with Differential/Platelet    Standing Status:   Future    Number of Occurrences:   1    Standing Expiration Date:   09/19/2023   Comprehensive metabolic panel    Standing Status:   Future    Number of Occurrences:   1    Standing Expiration Date:   09/19/2023    Order Specific Question:   Has the patient fasted?    Answer:   Yes   EEG Child    Standing Status:   Future    Standing Expiration Date:   09/19/2023    Scheduling  Instructions:     Will need extra time and 2 techs    Order Specific Question:   Where should this test be performed?    Answer:   [facility]    Order Specific Question:   Reason for exam    Answer:   Other (see comment)    Order Specific Question:   Comment    Answer:   History of Edmonia Lynch; having side effects to medication; need to make changes in treatment plan     Allergies as of 09/19/2022       Reactions   Carbamazepine Rash        Medication List        Accurate as of September 19, 2022 10:53 AM. If you have any questions, ask your nurse or doctor.          acetaminophen 650 MG suppository Commonly known as: TYLENOL Place 650 mg rectally every 8 (eight) hours as needed for mild pain.   acetaminophen 500 MG tablet Commonly known as: TYLENOL Take 500 mg by mouth every 6 (six) hours as needed for fever.   albuterol (2.5 MG/3ML) 0.083% nebulizer solution Commonly known as: PROVENTIL Take 3 mLs (2.5 mg total) by nebulization every 6 (six) hours as needed for wheezing or shortness of breath.   amoxicillin-clavulanate 875-125 MG tablet Commonly known as: AUGMENTIN Take 1 tablet by mouth every 12 (twelve) hours.   Banzel 40 MG/ML Susp Generic drug: Rufinamide TAKE 5MLS EVERY MORNING AND 5MLS EVERY NIGHT AT BEDTIME   Calcarb 600/D 600-10 MG-MCG Tabs Generic drug: Calcium Carbonate-Vitamin D Take 1 tablet by mouth once a week.   cetirizine HCl 1 MG/ML solution Commonly known as: ZYRTEC TAKE 10 ML(10 MG) BY MOUTH DAILY   Depakote Sprinkles 125 MG capsule Generic drug: divalproex TAKE 8 CAPSULES BY MOUTH TWICE DAILY   diazepam 20 MG Gel Commonly known as: Diastat AcuDial DIAL TO 15 MG, GIVE RECTALLY FOR SEIZURES LASTING 2 MINUTES OR LONGER   Valtoco 20 MG Dose 2 x 10 MG/0.1ML Lqpk Generic drug: diazePAM (20 MG Dose) Give 1 spray in each nostril for seizures lasting 2 minutes or longer.   fluconazole 150 MG tablet Commonly known as: DIFLUCAN Take by  mouth.   fluticasone 50 MCG/ACT nasal spray Commonly known as: FLONASE SHAKE LIQUID AND USE 2 SPRAYS IN EACH NOSTRIL TWICE DAILY   furosemide 20 MG tablet Commonly known as: LASIX TAKE 1 TABLET BY MOUTH EVERY DAY AS NEEDED FOR SWELLING   Keppra 100 MG/ML solution Generic drug: levETIRAcetam TAKE 12 MLS BY MOUTH MORNING EVERY MORNING AND AT NIGHT   ketotifen 0.025 % ophthalmic solution Commonly known as: ZADITOR Apply 1 drop to eye 2 (two) times daily.   lactose free nutrition Liqd Take 237 mLs by mouth 3 (three) times daily with meals.   lactulose 10 GM/15ML solution Commonly known as: [place]  30 ML BY MOUTH TWICE DAILY   levOCARNitine 1 GM/10ML solution Commonly known as: CARNITOR TAKE 5 ML BY MOUTH THREE TIMES DAILY   liver oil-zinc oxide 40 % ointment Commonly known as: DESITIN Apply 1 application topically daily.   Nebulizer Mask Adult Misc Use with nebulizer machine when needed   Nebulizer/Pediatric Mask Kit Use with nebulizer machine as needed for intermittent wheezing and shortness of breath.   nystatin powder Commonly known as: nystatin APPLY TO THE AFFECTED AREA WITH EACH DIAPER CHANGE UNTIL RASH IS GONE   pantoprazole sodium 40 mg Commonly known as: PROTONIX MIX 1 PACK AND TAKE DAILY AS DIRECTED   promethazine-dextromethorphan 6.25-15 MG/5ML syrup Commonly known as: PROMETHAZINE-DM Take 5 mLs by mouth 4 (four) times daily as needed for cough.   Topamax Sprinkle 25 MG capsule Generic drug: topiramate TAKE 6 CAPSULES BY MOUTH EVERY MORNING AND IN THE EVENING WITH FOOD   VITAMIN D PO Take by mouth.      I discussed this patient's care with Dr Rogers Blocker to develop this assessment and plan.  Total time spent with the patient was 30 minutes, of which 50% or more was spent in counseling and coordination of care.  Rockwell Germany NP-C [place] [facility] [id number], [place], [place] [address] Ph.  [phone] Fax [phone]

## 2022-09-19 NOTE — Patient Instructions (Signed)
It was a pleasure to see you today!  Instructions for you until your next appointment are as follows: Continue giving Depakote Sprinkles 7 capsules twice per day Return on Thursday 09/21/2022 for blood tests I will call you when I receive the lab results.  Monitor the tremors and let me know if they worsen Continue the other medications as prescribed for now I have also ordered an EEG to be done at [facility] as we discussed today I will call you when I receive the EEG report.  Please sign up for MyChart if you have not done so. Please plan to return for follow up in September as scheduled or sooner if needed.  Feel free to contact our office during normal business hours at [phone] with questions or concerns. If there is no answer or the call is outside business hours, please leave a message and our clinic staff will call you back within the next business day.  If you have an urgent concern, please stay on the line for our after-hours answering service and ask for the on-call neurologist.     I also encourage you to use MyChart to communicate with me more directly. If you have not yet signed up for MyChart within [facility], the front desk staff can help you. However, please note that this inbox is NOT monitored on nights or weekends, and response can take up to 2 business days.  Urgent matters should be discussed with the on-call pediatric neurologist.   At [facility], we are committed to providing exceptional care. You will receive a patient satisfaction survey through text or email regarding your visit today. Your opinion is important to me. Comments are appreciated.

## 2022-09-21 ENCOUNTER — Other Ambulatory Visit (INDEPENDENT_AMBULATORY_CARE_PROVIDER_SITE_OTHER): Payer: Self-pay | Admitting: Family

## 2022-09-21 DIAGNOSIS — Z79899 Other long term (current) drug therapy: Secondary | ICD-10-CM

## 2022-09-21 DIAGNOSIS — E8809 Other disorders of plasma-protein metabolism, not elsewhere classified: Secondary | ICD-10-CM

## 2022-09-21 DIAGNOSIS — G40814 Lennox-Gastaut syndrome, intractable, without status epilepticus: Secondary | ICD-10-CM

## 2022-09-21 DIAGNOSIS — D72828 Other elevated white blood cell count: Secondary | ICD-10-CM

## 2022-09-21 DIAGNOSIS — G40319 Generalized idiopathic epilepsy and epileptic syndromes, intractable, without status epilepticus: Secondary | ICD-10-CM

## 2022-09-22 ENCOUNTER — Telehealth (INDEPENDENT_AMBULATORY_CARE_PROVIDER_SITE_OTHER): Payer: Self-pay | Admitting: Family

## 2022-09-22 DIAGNOSIS — G40814 Lennox-Gastaut syndrome, intractable, without status epilepticus: Secondary | ICD-10-CM

## 2022-09-22 DIAGNOSIS — Z79899 Other long term (current) drug therapy: Secondary | ICD-10-CM

## 2022-09-22 DIAGNOSIS — I890 Lymphedema, not elsewhere classified: Secondary | ICD-10-CM

## 2022-09-22 DIAGNOSIS — D72828 Other elevated white blood cell count: Secondary | ICD-10-CM

## 2022-09-22 DIAGNOSIS — E8809 Other disorders of plasma-protein metabolism, not elsewhere classified: Secondary | ICD-10-CM

## 2022-09-22 DIAGNOSIS — R251 Tremor, unspecified: Secondary | ICD-10-CM

## 2022-09-22 LAB — CBC WITH DIFFERENTIAL/PLATELET
Absolute Monocytes: 740 cells/uL (ref 200–950)
Basophils Absolute: 34 cells/uL (ref 0–200)
Basophils Relative: 0.4 %
Eosinophils Absolute: 43 cells/uL (ref 15–500)
Eosinophils Relative: 0.5 %
HCT: 42.8 % (ref 35.0–45.0)
Hemoglobin: 14.7 g/dL (ref 11.7–15.5)
Lymphs Abs: 4105.5 cells/uL — ABNORMAL HIGH (ref 850–3900)
MCH: 34.7 pg — ABNORMAL HIGH (ref 27.0–33.0)
MCHC: 34.3 g/dL (ref 32.0–36.0)
MCV: 100.9 fL — ABNORMAL HIGH (ref 80.0–100.0)
MPV: 10.5 fL (ref 7.5–12.5)
Monocytes Relative: 8.7 %
Neutro Abs: 3579 cells/uL (ref 1500–7800)
Neutrophils Relative %: 42.1 %
Platelets: 213 10*3/uL (ref 140–400)
RBC: 4.24 10*6/uL (ref 3.80–5.10)
RDW: 15.1 % — ABNORMAL HIGH (ref 11.0–15.0)
Total Lymphocyte: 48.3 %
WBC: 8.5 10*3/uL (ref 3.8–10.8)

## 2022-09-22 LAB — COMPREHENSIVE METABOLIC PANEL
AG Ratio: 1 (calc) (ref 1.0–2.5)
ALT: 12 U/L (ref 6–29)
AST: 20 U/L (ref 10–30)
Albumin: 3.3 g/dL — ABNORMAL LOW (ref 3.6–5.1)
Alkaline phosphatase (APISO): 64 U/L (ref 31–125)
BUN/Creatinine Ratio: 7 (calc) (ref 6–22)
BUN: 7 mg/dL (ref 7–25)
CO2: 30 mmol/L (ref 20–32)
Calcium: 9.3 mg/dL (ref 8.6–10.2)
Chloride: 100 mmol/L (ref 98–110)
Creat: 1 mg/dL — ABNORMAL HIGH (ref 0.50–0.97)
Globulin: 3.2 g/dL (calc) (ref 1.9–3.7)
Glucose, Bld: 86 mg/dL (ref 65–99)
Potassium: 3.1 mmol/L — ABNORMAL LOW (ref 3.5–5.3)
Sodium: 143 mmol/L (ref 135–146)
Total Bilirubin: 0.3 mg/dL (ref 0.2–1.2)
Total Protein: 6.5 g/dL (ref 6.1–8.1)

## 2022-09-22 LAB — VALPROIC ACID LEVEL: Valproic Acid Lvl: 97.8 mg/L (ref 50.0–100.0)

## 2022-09-22 NOTE — Telephone Encounter (Signed)
I called lab results to Mom. She said that Michele [name] had been doing well. She has an appointment for EEG on Monday. I told Mom that we should repeat the lab studies in 2 weeks. She agreed with this plan. TG

## 2022-09-25 ENCOUNTER — Ambulatory Visit (HOSPITAL_COMMUNITY)
Admission: RE | Admit: 2022-09-25 | Discharge: 2022-09-25 | Disposition: A | Discharge status: Home / Self Care | Payer: Medicare Other | Source: Ambulatory Visit | Attending: Family | Admitting: Family

## 2022-09-25 DIAGNOSIS — R251 Tremor, unspecified: Secondary | ICD-10-CM | POA: Diagnosis not present

## 2022-09-25 DIAGNOSIS — G40814 Lennox-Gastaut syndrome, intractable, without status epilepticus: Secondary | ICD-10-CM | POA: Diagnosis not present

## 2022-09-25 DIAGNOSIS — E8809 Other disorders of plasma-protein metabolism, not elsewhere classified: Secondary | ICD-10-CM

## 2022-09-25 DIAGNOSIS — G40812 Lennox-Gastaut syndrome, not intractable, without status epilepticus: Secondary | ICD-10-CM | POA: Insufficient documentation

## 2022-09-25 DIAGNOSIS — G40909 Epilepsy, unspecified, not intractable, without status epilepticus: Secondary | ICD-10-CM

## 2022-09-25 DIAGNOSIS — G808 Other cerebral palsy: Secondary | ICD-10-CM | POA: Diagnosis not present

## 2022-09-25 DIAGNOSIS — I890 Lymphedema, not elsewhere classified: Secondary | ICD-10-CM

## 2022-09-25 DIAGNOSIS — F73 Profound intellectual disabilities: Secondary | ICD-10-CM

## 2022-09-25 DIAGNOSIS — D72828 Other elevated white blood cell count: Secondary | ICD-10-CM

## 2022-09-25 DIAGNOSIS — G40319 Generalized idiopathic epilepsy and epileptic syndromes, intractable, without status epilepticus: Secondary | ICD-10-CM

## 2022-09-25 DIAGNOSIS — Z79899 Other long term (current) drug therapy: Secondary | ICD-10-CM

## 2022-09-25 NOTE — Procedures (Signed)
Patient: SAYLA KALEN MRN: [id number] Sex: female DOB: 1986/01/17  Clinical History: Kahlaya is a 37 y.o. with congenital quadriplegia and intractable Lennox gastaut syndrome who has been having worsening tremor in setting of depakote use.  EEG to assess epilepsy status, last EEG not recorded in EPIC.   Medications: Depakote, Keppra, Topamax, Banzel.   Procedure: The tracing is carried out on a 32-channel digital Natus recorder, reformatted into 16-channel montages with 1 devoted to EKG.  The patient was awake and during the recording.  The international 10/20 system lead placement used.  Recording time 34 minutes.  Recording was done simultaneous with continuous video throughout the entire record.   Description of Findings: Background rhythm is low amplitude throughout with no posterior dominant rhythm present.   Drowsiness and sleep were not seen during this recording.    There were occasional muscle and blinking artifacts noted.  Hyperventilation and photic stimulation were not completed during this recording.   Throughout the recording there were no focal or generalized epileptiform activities in the form of spikes or sharps noted. There were no transient rhythmic activities or electrographic seizures noted.  One lead EKG rhythm strip was not connected during this recording.   Impression: This is a abnormal record with the patient in awake states due to global low amplitude activity.  No evidence of epileptic activity throughout the recording. Clinical correlation advised.    Lorenz Coaster MD MPH

## 2022-09-25 NOTE — Progress Notes (Addendum)
EEG complete - results pending 

## 2022-10-06 LAB — CBC WITH DIFFERENTIAL/PLATELET
Absolute Monocytes: 612 cells/uL (ref 200–950)
Basophils Absolute: 20 cells/uL (ref 0–200)
Basophils Relative: 0.3 %
Eosinophils Absolute: 27 cells/uL (ref 15–500)
Eosinophils Relative: 0.4 %
HCT: 44.6 % (ref 35.0–45.0)
Hemoglobin: 14.8 g/dL (ref 11.7–15.5)
Lymphs Abs: 3964 cells/uL — ABNORMAL HIGH (ref 850–3900)
MCH: 33.9 pg — ABNORMAL HIGH (ref 27.0–33.0)
MCHC: 33.2 g/dL (ref 32.0–36.0)
MCV: 102.1 fL — ABNORMAL HIGH (ref 80.0–100.0)
MPV: 12.2 fL (ref 7.5–12.5)
Monocytes Relative: 9 %
Neutro Abs: 2176 cells/uL (ref 1500–7800)
Neutrophils Relative %: 32 %
Platelets: 138 10*3/uL — ABNORMAL LOW (ref 140–400)
RBC: 4.37 10*6/uL (ref 3.80–5.10)
RDW: 15.2 % — ABNORMAL HIGH (ref 11.0–15.0)
Total Lymphocyte: 58.3 %
WBC: 6.8 10*3/uL (ref 3.8–10.8)

## 2022-10-06 LAB — COMPREHENSIVE METABOLIC PANEL
AG Ratio: 1.1 (calc) (ref 1.0–2.5)
ALT: 9 U/L (ref 6–29)
AST: 17 U/L (ref 10–30)
Albumin: 3.4 g/dL — ABNORMAL LOW (ref 3.6–5.1)
Alkaline phosphatase (APISO): 73 U/L (ref 31–125)
BUN/Creatinine Ratio: 14 (calc) (ref 6–22)
BUN: 15 mg/dL (ref 7–25)
CO2: 28 mmol/L (ref 20–32)
Calcium: 9.2 mg/dL (ref 8.6–10.2)
Chloride: 106 mmol/L (ref 98–110)
Creat: 1.08 mg/dL — ABNORMAL HIGH (ref 0.50–0.97)
Globulin: 3.1 g/dL (calc) (ref 1.9–3.7)
Glucose, Bld: 76 mg/dL (ref 65–99)
Potassium: 4.1 mmol/L (ref 3.5–5.3)
Sodium: 145 mmol/L (ref 135–146)
Total Bilirubin: 0.3 mg/dL (ref 0.2–1.2)
Total Protein: 6.5 g/dL (ref 6.1–8.1)

## 2022-10-06 LAB — VALPROIC ACID LEVEL: Valproic Acid Lvl: 76.1 mg/L (ref 50.0–100.0)

## 2022-10-09 ENCOUNTER — Telehealth (INDEPENDENT_AMBULATORY_CARE_PROVIDER_SITE_OTHER): Payer: Self-pay | Admitting: Family

## 2022-10-09 DIAGNOSIS — G40319 Generalized idiopathic epilepsy and epileptic syndromes, intractable, without status epilepticus: Secondary | ICD-10-CM

## 2022-10-09 MED ORDER — DEPAKOTE SPRINKLES 125 MG PO CSDR: DELAYED_RELEASE_CAPSULE | ORAL | 5 refills | Status: DC

## 2022-10-09 NOTE — Telephone Encounter (Signed)
I called Mom and reviewed recent lab results with her. She said that Michele [name] has not had seizures but continues to have tremor. I instructed her to reduce the Depakote dose to 6 capsules twice per day. I will follow up with her in 2 weeks to see how she is doing. Mom agreed with this plan. TG

## 2022-10-13 ENCOUNTER — Telehealth: Payer: Self-pay | Admitting: Family Medicine

## 2022-10-13 NOTE — Telephone Encounter (Signed)
Contacted Michele [name] to schedule their annual wellness visit. Patient declined to schedule AWV at this time.  Patient wants to check with office before scheduling appointment.   Thank you,  [facility] Support [facility] Direct dial  [phone]

## 2022-10-23 MED ORDER — DEPAKOTE SPRINKLES 125 MG PO CSDR: DELAYED_RELEASE_CAPSULE | ORAL | 5 refills | Status: DC

## 2022-10-23 NOTE — Addendum Note (Signed)
Addended by: Princella Ion on: 10/23/2022 05:24 PM   Modules accepted: Orders

## 2022-10-23 NOTE — Telephone Encounter (Signed)
I called Mom to check on Michele [name]. She said that the tremors were still occurring but are less forceful than in the past. She has had 2 brief staring seizures. I recommended decreasing the Depakote dose to 5 capsules twice per day and will follow up with her in 2 weeks. Mom agreed with this plan. TG

## 2022-10-23 NOTE — Telephone Encounter (Signed)
Called patient to schedule Medicare Annual Wellness Visit (AWV). No voicemail available to leave a message.  Patient's mother spoke w/ Fabian November and would like to schedule AWV.  Last date of AWV: AWVI eligible as of 09/17/2016   Please schedule an AWVI appointment at any time with [facility] VISIT.  If any questions, please contact me at [phone].    Thank you,  [facility] Support [facility] Direct dial  [phone]

## 2022-10-24 ENCOUNTER — Telehealth: Payer: Self-pay | Admitting: Family Medicine

## 2022-10-24 NOTE — Telephone Encounter (Signed)
Contacted Michele [name] to schedule their annual wellness visit. Appointment made for 10/26/2022.  Thank you,  [facility] Support [facility] Direct dial  [phone]

## 2022-10-25 NOTE — Patient Instructions (Signed)

## 2022-10-25 NOTE — Progress Notes (Unsigned)
I connected with  Michele [name] and mother Michele [name] on 10/26/2022 by a audio enabled telemedicine application and verified that I am speaking with the correct person using two identifiers. The patient is nonverbal, all communication is with caregiver her mother Michele [name].  Patient Location: Home  Provider Location: Home Office  I discussed the limitations of evaluation and management by telemedicine. The patient expressed understanding and agreed to proceed.   Subjective:   Michele [name] is a 37 y.o. female who presents for an Initial Medicare Annual Wellness Visit.  Review of Systems    Per HPI unless specifically indicated below.         Objective:       09/19/2022    8:06 AM 09/12/2022    6:27 AM 09/12/2022    1:02 AM  Vitals with BMI  Height 4\' 11"     Weight 210 lbs    BMI 42.39    Systolic 126 118 130  Diastolic 76 79 80  Pulse  104 865    There were no vitals filed for this visit. There is no height or weight on file to calculate BMI.     09/12/2022    1:16 AM 11/08/2021    9:27 AM 07/21/2021    8:55 AM 02/18/2021    2:55 PM 06/23/2020    9:25 AM 12/04/2019    2:19 PM 08/26/2018   11:19 AM  Advanced Directives  Does Patient Have a Medical Advance Directive? No No No No Yes Yes No  Type of Advance Directive     Healthcare Power of Attorney    Does patient want to make changes to medical advance directive?     No - Guardian declined    Would patient like information on creating a medical advance directive?  No - Guardian declined No - Patient declined No - Guardian declined   No - Patient declined    Current Medications (verified) Outpatient Encounter Medications as of 10/26/2022  Medication Sig   acetaminophen (TYLENOL) 500 MG tablet Take 500 mg by mouth every 6 (six) hours as needed for fever. (Patient not taking: Reported on 08/29/2022)   acetaminophen (TYLENOL) 650 MG suppository Place 650 mg rectally every 8 (eight) hours as needed for mild pain.    albuterol (PROVENTIL) (2.5 MG/3ML) 0.083% nebulizer solution Take 3 mLs (2.5 mg total) by nebulization every 6 (six) hours as needed for wheezing or shortness of breath.   amoxicillin-clavulanate (AUGMENTIN) 875-125 MG tablet Take 1 tablet by mouth every 12 (twelve) hours.   BANZEL 40 MG/ML SUSP TAKE EVERY MORNING AND EVERY NIGHT AT BEDTIME   Calcium Carbonate-Vitamin D (CALCARB 600/D) 600-400 MG-UNIT tablet Take 1 tablet by mouth once a week.   cetirizine HCl (ZYRTEC) 1 MG/ML solution TAKE 10 ML(10 MG) BY MOUTH DAILY   DEPAKOTE SPRINKLES 125 MG capsule TAKE 5 CAPSULES BY MOUTH TWICE DAILY   diazepam (DIASTAT ACUDIAL) 20 MG GEL DIAL TO 15 MG, GIVE RECTALLY FOR SEIZURES LASTING 2 MINUTES OR LONGER   fluconazole (DIFLUCAN) 150 MG tablet Take by mouth.   fluticasone (FLONASE) 50 MCG/ACT nasal spray SHAKE LIQUID AND USE 2 SPRAYS IN EACH NOSTRIL TWICE DAILY   furosemide (LASIX) 20 MG tablet TAKE 1 TABLET BY MOUTH EVERY DAY AS NEEDED FOR SWELLING   KEPPRA 100 MG/ML solution TAKE 12 MLS BY MOUTH MORNING EVERY MORNING AND AT NIGHT   ketotifen (ZADITOR) 0.025 % ophthalmic solution Apply 1 drop to eye 2 (two)  times daily.   lactose free nutrition (BOOST PLUS) LIQD Take 237 mLs by mouth 3 (three) times daily with meals.   lactulose (CHRONULAC) 10 GM/15ML solution TAKE 30 ML BY MOUTH TWICE DAILY   levOCARNitine (CARNITOR) 1 GM/10ML solution TAKE 5 ML BY MOUTH THREE TIMES DAILY   liver oil-zinc oxide (DESITIN) 40 % ointment Apply 1 application topically daily.   nystatin powder APPLY TO THE AFFECTED AREA WITH EACH DIAPER CHANGE UNTIL RASH IS GONE   pantoprazole sodium (PROTONIX) 40 mg MIX 1 PACK AND TAKE DAILY AS DIRECTED   promethazine-dextromethorphan (PROMETHAZINE-DM) 6.25-15 MG/5ML syrup Take 5 mLs by mouth 4 (four) times daily as needed for cough.   Respiratory Therapy Supplies (NEBULIZER MASK ADULT) MISC Use with nebulizer machine when needed   Respiratory Therapy Supplies (NEBULIZER/PEDIATRIC  MASK) KIT Use with nebulizer machine as needed for intermittent wheezing and shortness of breath.   TOPAMAX SPRINKLE 25 MG capsule TAKE 6 CAPSULES BY MOUTH EVERY MORNING AND IN THE EVENING WITH FOOD   VALTOCO 20 MG DOSE 10 MG/0.1ML LQPK Give 1 spray in each nostril for seizures lasting 2 minutes or longer.   VITAMIN D PO Take by mouth.   No facility-administered encounter medications on file as of 10/26/2022.    Allergies (verified) Carbamazepine   History: Past Medical History:  Diagnosis Date   Acute hypoxemic respiratory failure due to COVID-19 (HCC) 08/03/2019   Cerebral palsy (HCC)    Hypothyroidism    Lennox-Gastaut syndrome (HCC)    Osteoporosis    Pneumonia    Poor appetite 02/21/2021   Profound mental retardation    Reflux    Rickets    Seizures (HCC)    Upper respiratory symptom 06/02/2019   Past Surgical History:  Procedure Laterality Date   FOOT MASS EXCISION Right    Gastrocutaneous fistual repair  2012   PEG TUBE PLACEMENT     PEG TUBE REMOVAL  2012 ?   Family History  Problem Relation Age of Onset   Hypertension Mother    Cancer Maternal Aunt        lung   Heart disease Maternal Grandmother        Died at 14   Stroke Maternal Grandfather        died at 35   Hypertension Paternal Grandmother    Diabetes Other        MGGM   Heart disease Other        MGGF   Kidney disease Other        Maternal Great Aunt    Colon cancer Neg Hx    Social History   Socioeconomic History   Marital status: Single    Spouse name: Not on file   Number of children: 0   Years of education: Not on file   Highest education level: Not on file  Occupational History   Occupation: Disabled   Tobacco Use   Smoking status: Never    Passive exposure: Never   Smokeless tobacco: Never  Vaping Use   Vaping Use: Never used  Substance and Sexual Activity   Alcohol use: No   Drug use: No   Sexual activity: Never  Other Topics Concern   Not on file  Social History  Narrative   0 caffeine drinks    Lives with mother who helps ADLs.   Graduated from [employer] in 2010. Now attends day program at [employer].    Enjoys playing in water, ripping paper, watching TV, music and going  out.   Social Determinants of Health   Financial Resource Strain: Not on file  Food Insecurity: Not on file  Transportation Needs: Not on file  Physical Activity: Not on file  Stress: Not on file  Social Connections: Not on file    Tobacco Counseling Counseling given: Not Answered   Clinical Intake:                 Diabetic?***         Activities of Daily Living     No data to display          Patient Care Team: Fayette Pho, MD as PCP - General (Family Medicine) Elveria Rising, NP as Nurse Practitioner (Neurology)  Indicate any recent Medical Services you may have received from other than [facility] providers in the past year (date may be approximate).     Assessment:   This is a routine wellness examination for Michele [name].  Hearing/Vision screen No results found.  Dietary issues and exercise activities discussed:  Quadriplegia    Goals Addressed   None    Depression Screen    08/22/2022    9:15 AM 07/21/2022    8:37 AM 11/08/2021    9:26 AM 02/18/2021    2:55 PM 06/30/2020    1:39 PM 06/07/2020    9:29 AM 03/26/2020    3:35 PM  PHQ 2/9 Scores  PHQ - 2 Score 2 0   0 0 0  PHQ- 9 Score 5 3   0 1   Exception Documentation Other- indicate reason in comment box  Other- indicate reason in comment box Medical reason     Not completed Pt non verbal, mother filled out form  pt is non verbal        Fall Risk    01/09/2019    2:14 PM 05/23/2018    2:00 PM 05/23/2018    1:50 PM 11/21/2017    9:32 AM 12/07/2016   10:29 AM  Fall Risk   Falls in the past year? 0 0 0 No No    FALL RISK PREVENTION PERTAINING TO THE HOME:  Any stairs in or around the home? No  If so, are there any without handrails? No  Home free of loose throw rugs in walkways,  pet beds, electrical cords, etc? Yes  Adequate lighting in your home to reduce risk of falls? Yes   ASSISTIVE DEVICES UTILIZED TO PREVENT FALLS:  Life alert? {YES/NO:21197} Use of a cane, walker or w/c? Yes , wheelchair dependance  Grab bars in the bathroom? {YES/NO:21197} Shower chair or bench in shower? {YES/NO:21197} Elevated toilet seat or a handicapped toilet? 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     Immunizations Immunization History  Administered Date(s) Administered   Influenza Split 04/14/2011, 03/22/2012   Influenza Whole 05/26/2009, 04/04/2010   Influenza,inj,Quad PF,6+ Mos 04/03/2013, 04/04/2014, 03/08/2015, 03/08/2016, 03/26/2017, 04/12/2018, 03/03/2019, 03/26/2020, 02/18/2021, 07/21/2022   PFIZER(Purple Top)SARS-COV-2 Vaccination 11/10/2019, 12/01/2019, 05/28/2020   Pfizer Covid-19 Vaccine Bivalent Booster 68yrs & up 05/31/2021   Tdap 01/14/2014    TDAP status: Up to date  Flu Vaccine status: Up to date  Pneumococcal vaccine status: Up to date  Covid-19 vaccine status: Information provided on how to obtain vaccines.   Qualifies for Shingles Vaccine? No    Screening Tests Health Maintenance  Topic Date Due   PAP SMEAR-Modifier  06/05/2014   COVID-19 Vaccine (5 - 2023-24 season) 02/17/2022   INFLUENZA VACCINE  01/18/2023   Medicare Annual Wellness (AWV)  10/26/2023   DTaP/Tdap/Td (2 - Td or Tdap) 01/15/2024   Hepatitis C Screening  Completed   HIV Screening  Completed   HPV VACCINES  Aged Out    Health Maintenance  Health Maintenance Due  Topic Date Due   PAP SMEAR-Modifier  06/05/2014   COVID-19 Vaccine (5 - 2023-24 season) 02/17/2022    Colorectal Cancer Screening: not applicable   Mammogram: not applicable    DEXA Scan: not applicable   Lung Cancer Screening: (Low Dose CT Chest recommended if Age 40-80 years, 30 pack-year currently smoking OR have quit w/in 15years.) does not qualify.   Lung Cancer Screening Referral: ***  Additional Screening:  Hepatitis C Screening: does qualify; Completed 06/30/2020  Vision Screening: Recommended annual ophthalmology exams for early detection of glaucoma and other disorders of the eye. Is the patient up to date with their annual eye exam?  Yes  Who is the provider or what is the name of the office in which the patient attends annual eye exams? *** If pt is not established with a provider, would they like to be referred to a provider to establish care? No .   Dental Screening: Recommended annual dental exams for proper oral hygiene  Community Resource Referral / Chronic Care Management: CRR required this visit?  No   CCM required this visit?  No      Plan:     I have personally reviewed and noted the following in the patient's chart:   Medical and social history Use of alcohol, tobacco or illicit drugs  Current medications and supplements including opioid prescriptions. Patient is not currently taking opioid prescriptions. Functional ability and status Nutritional status Physical activity Advanced directives List of other physicians Hospitalizations, surgeries, and ER visits in previous 12 months Vitals Screenings to include cognitive, depression, and falls Referrals and appointments  In addition, I have reviewed and discussed with patient certain preventive protocols, quality metrics, and best practice recommendations. A written personalized care plan for preventive services as well as general preventive health recommendations were provided to patient.     Lonna Cobb, CMA   10/26/2022  Nurse Notes: Approximately 30 minute Non-Face -To-Face Medicare Wellness Visit

## 2022-10-26 ENCOUNTER — Ambulatory Visit: Payer: Medicare Other

## 2022-10-26 ENCOUNTER — Ambulatory Visit (INDEPENDENT_AMBULATORY_CARE_PROVIDER_SITE_OTHER): Payer: Medicare Other

## 2022-10-26 DIAGNOSIS — Z0000 Encounter for general adult medical examination without abnormal findings: Secondary | ICD-10-CM

## 2022-11-09 ENCOUNTER — Other Ambulatory Visit: Payer: Self-pay

## 2022-11-09 MED ORDER — NYSTATIN 100000 UNIT/GM EX POWD: CUTANEOUS | 3 refills | Status: DC

## 2022-11-10 ENCOUNTER — Other Ambulatory Visit (HOSPITAL_COMMUNITY): Payer: Self-pay

## 2022-11-10 ENCOUNTER — Telehealth: Payer: Self-pay

## 2022-11-10 DIAGNOSIS — L22 Diaper dermatitis: Secondary | ICD-10-CM

## 2022-11-10 NOTE — Telephone Encounter (Signed)
A Prior Authorization was initiated for this patients [place]STATIN POWDER through CoverMyMeds.   Key: [id number]

## 2022-11-13 NOTE — Telephone Encounter (Signed)
Prior Auth for patients medication NYSTATIN POWDER denied by [facility] via CoverMyMeds.   Reason: The Medicare rule in the Prescription Drug Manual says bulk powders (i.e., Active Pharmaceutical Ingredients for compounding) do not satisfy the definition of a Part D drug and are not covered by Part D. [employer] follows Medicare rules. Your drug is a bulk powder and per Medicare rules is not covered.  CoverMyMeds Key: [id number] [place] Case ID: [id number]

## 2022-11-14 MED ORDER — NYSTATIN 100000 UNIT/GM EX OINT: 1.0000 | TOPICAL_OINTMENT | Freq: Two times a day (BID) | CUTANEOUS | 11 refills | Status: DC

## 2022-11-14 NOTE — Addendum Note (Signed)
Addended by: Valetta Close on: 11/14/2022 06:00 PM   Modules accepted: Orders

## 2022-11-14 NOTE — Telephone Encounter (Signed)
Called pharmacy. Apparently powder is not covered but ointment is. Will send in order for nystatin ointment, 30 g tube. Use cream instead per insurance.   Script sent.   Fayette Pho, MD

## 2023-01-08 ENCOUNTER — Other Ambulatory Visit (INDEPENDENT_AMBULATORY_CARE_PROVIDER_SITE_OTHER): Payer: Self-pay | Admitting: Family

## 2023-01-08 ENCOUNTER — Other Ambulatory Visit: Payer: Self-pay

## 2023-01-08 DIAGNOSIS — G40319 Generalized idiopathic epilepsy and epileptic syndromes, intractable, without status epilepticus: Secondary | ICD-10-CM

## 2023-01-08 DIAGNOSIS — L22 Diaper dermatitis: Secondary | ICD-10-CM

## 2023-01-08 MED ORDER — NYSTATIN 100000 UNIT/GM EX OINT: 1.0000 | TOPICAL_OINTMENT | Freq: Two times a day (BID) | CUTANEOUS | 11 refills | Status: DC

## 2023-02-04 ENCOUNTER — Other Ambulatory Visit: Payer: Self-pay | Admitting: Family Medicine

## 2023-02-09 ENCOUNTER — Telehealth: Payer: Self-pay | Admitting: Family Medicine

## 2023-02-09 NOTE — Telephone Encounter (Signed)
Form is in PCP box and needs be signed off by [place].

## 2023-02-09 NOTE — Telephone Encounter (Signed)
Patient's mother came in stating that [facility] has been trying to get in touch with our office to refill patient's incontinence supplies. Can reach out to Gavin Pound at [facility], [phone]. Fax # [phone]. She is almost out of supplies.

## 2023-02-14 ENCOUNTER — Other Ambulatory Visit (INDEPENDENT_AMBULATORY_CARE_PROVIDER_SITE_OTHER): Payer: Self-pay

## 2023-02-14 DIAGNOSIS — G40319 Generalized idiopathic epilepsy and epileptic syndromes, intractable, without status epilepticus: Secondary | ICD-10-CM

## 2023-02-14 MED ORDER — KEPPRA 100 MG/ML PO SOLN: ORAL | 0 refills | Status: DC

## 2023-02-15 NOTE — Telephone Encounter (Signed)
Form completed and placed in fax pile. 

## 2023-03-01 ENCOUNTER — Ambulatory Visit (INDEPENDENT_AMBULATORY_CARE_PROVIDER_SITE_OTHER): Payer: Medicare Other | Admitting: Family

## 2023-03-01 ENCOUNTER — Encounter (INDEPENDENT_AMBULATORY_CARE_PROVIDER_SITE_OTHER): Payer: Self-pay | Admitting: Family

## 2023-03-01 VITALS — BP 124/74 | HR 64

## 2023-03-01 DIAGNOSIS — G40812 Lennox-Gastaut syndrome, not intractable, without status epilepticus: Secondary | ICD-10-CM

## 2023-03-01 DIAGNOSIS — G40814 Lennox-Gastaut syndrome, intractable, without status epilepticus: Secondary | ICD-10-CM

## 2023-03-01 DIAGNOSIS — R251 Tremor, unspecified: Secondary | ICD-10-CM

## 2023-03-01 DIAGNOSIS — Z993 Dependence on wheelchair: Secondary | ICD-10-CM

## 2023-03-01 DIAGNOSIS — G40909 Epilepsy, unspecified, not intractable, without status epilepticus: Secondary | ICD-10-CM | POA: Diagnosis not present

## 2023-03-01 DIAGNOSIS — F73 Profound intellectual disabilities: Secondary | ICD-10-CM

## 2023-03-01 DIAGNOSIS — G40319 Generalized idiopathic epilepsy and epileptic syndromes, intractable, without status epilepticus: Secondary | ICD-10-CM

## 2023-03-01 DIAGNOSIS — I890 Lymphedema, not elsewhere classified: Secondary | ICD-10-CM

## 2023-03-01 DIAGNOSIS — G801 Spastic diplegic cerebral palsy: Secondary | ICD-10-CM

## 2023-03-01 MED ORDER — LEVOCARNITINE 1 GM/10ML PO SOLN: ORAL | 5 refills | Status: DC

## 2023-03-01 MED ORDER — VALTOCO 20 MG DOSE 10 MG/0.1ML NA LQPK: NASAL | 5 refills | Status: DC

## 2023-03-01 MED ORDER — KEPPRA 100 MG/ML PO SOLN: ORAL | 5 refills | Status: DC

## 2023-03-01 MED ORDER — BANZEL 40 MG/ML PO SUSP: ORAL | 5 refills | Status: DC

## 2023-03-01 MED ORDER — TOPIRAMATE 25 MG PO CPSP: ORAL_CAPSULE | ORAL | 5 refills | Status: DC

## 2023-03-01 MED ORDER — DEPAKOTE SPRINKLES 125 MG PO CSDR: DELAYED_RELEASE_CAPSULE | ORAL | 5 refills | Status: DC

## 2023-03-01 NOTE — Progress Notes (Signed)
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 She is taking and tolerating brand Depakote, Keppra, Topamax and Banzel for her seizure disorder. Michele [name] typically turns blue and requires blow by oxygen when seizures occur. She is completely dependent upon others for all activities of daily living. She is cared for at home by her mother.   Due to her medical condition, she is indefinitely incontinent of stool and urine.  It is medically necessary for her to use diapers, underpads, and gloves to assist with hygiene and skin integrity.     Today's concerns: She had 1 seizure about a month ago, while sitting outside. Mom heard her make a sound and found that she was having a convulsive seizure that lasted more than 2 minutes. Mom gave her Valtoco and the seizure stopped.  Shalamar continues to have tremors in her hands. Mom tapered the Depakote as instructed at her last visit and is down to 5 capsules BID.  Mom had abdominal surgery recently. Michele [name] was cared for by her sister while Mom recovered. She has a Nurse, adult and wheelchair. Mom denies any other equipment needs at this time. Received Covid vaccine recently Michele [name] has been otherwise generally healthy since she was last seen. No health concerns today other than previously mentioned.  Review of systems: Please see HPI for neurologic and other pertinent review of systems. Otherwise all other systems were reviewed and were negative.  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   Morbid obesity (HCC) 12/24/2012   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   URINARY INCONTINENCE 02/25/2010   Full incontinence of feces 02/25/2010   Hypothyroidism 01/29/2009   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   Cerebral palsy (HCC) 01/29/2009   Osteoporosis 01/29/2009   Seizure disorder (HCC) 01/29/2009     Past Medical History:  Diagnosis Date   Acute hypoxemic respiratory failure due to COVID-19 (HCC) 08/03/2019   Cerebral palsy (HCC)    Hypothyroidism    Lennox-Gastaut syndrome (HCC)    Osteoporosis    Pneumonia    Poor appetite 02/21/2021   Profound mental retardation    Reflux    Rickets    Seizures (HCC)    Upper respiratory symptom 06/02/2019    Past medical history comments: See HPI  Surgical history: Past Surgical History:  Procedure Laterality Date   FOOT MASS EXCISION Right    Gastrocutaneous fistual repair  2012   PEG TUBE PLACEMENT     PEG TUBE REMOVAL  2012 ?     Family history: family history includes Cancer in her maternal aunt; Diabetes in  an other family member; Heart disease in her maternal grandmother and another family member; Hypertension in her mother and paternal grandmother; Kidney disease in an other family member; Stroke in her maternal grandfather.   Social history: Social History   Socioeconomic History   Marital status: Single    Spouse name: Not on file    Number of children: 0   Years of education: Not on file   Highest education level: Not on file  Occupational History   Occupation: Disabled   Tobacco Use   Smoking status: Never    Passive exposure: Never   Smokeless tobacco: Never  Vaping Use   Vaping status: Never Used  Substance and Sexual Activity   Alcohol use: No   Drug use: No   Sexual activity: Never  Other Topics Concern   Not on file  Social History Narrative   0 caffeine drinks    Lives with mother who helps ADLs.   Graduated from [employer] in 2010. Now attends day program at [employer].    Enjoys playing in water, ripping paper, watching TV, music and going out.   Social Determinants of Health   Financial Resource Strain: Low Risk  (10/26/2022)   Overall Financial Resource Strain (CARDIA)    Difficulty of Paying Living Expenses: Not hard at all  Food Insecurity: No Food Insecurity (10/26/2022)   Hunger Vital Sign    Worried About Running Out of Food in the Last Year: Never true    Ran Out of Food in the Last Year: Never true  Transportation Needs: No Transportation Needs (10/26/2022)   PRAPARE - Administrator, Civil Service (Medical): No    Lack of Transportation (Non-Medical): No  Physical Activity: Not on file  Stress: Not on file  Social Connections: Not on file  Intimate Partner Violence: Not on file    Past/failed meds: Copied from previous record: Carbamazepine - rash  Allergies: Allergies  Allergen Reactions   Carbamazepine Rash   Immunizations: Immunization History  Administered Date(s) Administered   Influenza Split 04/14/2011, 03/22/2012   Influenza Whole 05/26/2009, 04/04/2010   Influenza,inj,Quad PF,6+ Mos 04/03/2013, 04/04/2014, 03/08/2015, 03/08/2016, 03/26/2017, 04/12/2018, 03/03/2019, 03/26/2020, 02/18/2021, 07/21/2022   PFIZER(Purple Top)SARS-COV-2 Vaccination 11/10/2019, 12/01/2019, 05/28/2020   Pfizer Covid-19 Vaccine Bivalent Booster 38yrs & up 05/31/2021   Tdap  01/14/2014      Diagnostics/Screenings: Copied from previous record: 09/25/2022 - rEEG - This is a abnormal record with the patient in awake states due to global low amplitude activity.  No evidence of epileptic activity throughout the recording. Clinical correlation advised.  Lorenz Coaster MD MPH  Physical Exam: BP 124/74   Pulse 64   General: well developed, well nourished obese woman, seated in wheelchair, in no evident distress Head: normocephalic and atraumatic. Oropharynx difficult to examine but appears benign. No dysmorphic features. Neck: supple Cardiovascular: regular rate and rhythm, no murmurs. Respiratory: clear to auscultation bilaterally Abdomen: bowel sounds present all four quadrants, abdomen soft, non-tender, non-distended.  Musculoskeletal: no skeletal deformities or obvious scoliosis. Has increased tone and lymphedema in her lower extremities Skin: no rashes or neurocutaneous lesions  Neurologic Exam Mental Status: awake and fully alert. Has no language.  Smiles responsively. Claps her hands frequently. Unable to follow instructions or participate in examination Cranial Nerves: fundoscopic exam - red reflex present.  Unable to fully visualize fundus.  Pupils equal briskly reactive to light.  Turns to localize faces and objects in the periphery. Turns to localize sounds in the periphery. Facial  movements are symmetric Motor: normal functional bulk, tone and strength in upper extremities, increased tone in lower extremities Sensory: withdrawal x 4 Coordination: unable to adequately assess due to patient's inability to participate in examination. No dysmetria when reaching for objects. Gait and Station: unable to stand and bear weight.  Reflexes: diminished and symmetric. Toes neutral. 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 No recent diagnostic studies to be reviewed with the patient.  Plan until next visit: Reduce Depakote to 4 capsules BID. Plan to continue to taper and discontinue the dose as tolerated Continue other medications as prescribed. Refills sent to the pharmacy. Call for questions or concerns Return in about 6 months (around 08/29/2023).  The medication list was reviewed and reconciled. I reviewed the changes that were made in the prescribed medications today. A complete medication list was provided to the patient.  Allergies as of 03/01/2023       Reactions   Carbamazepine Rash        Medication List        Accurate as of March 01, 2023  8:54 AM. If you have any questions, ask your nurse or doctor.          acetaminophen 650 MG suppository Commonly known as: TYLENOL Place 650 mg rectally every 8 (eight) hours as needed for mild pain.   acetaminophen 500 MG tablet Commonly known as: TYLENOL Take 500 mg by mouth every 6 (six) hours as needed for fever.   albuterol (2.5 MG/3ML) 0.083% nebulizer solution Commonly known as: PROVENTIL Take 3 mLs (2.5 mg total) by nebulization every 6 (six) hours as needed for wheezing or shortness of breath.   Banzel 40 MG/ML Susp Generic drug: Rufinamide TAKE EVERY MORNING AND EVERY NIGHT AT BEDTIME   Calcarb 600/D 600-400 MG-UNIT Tabs Generic drug: Calcium Carbonate-Vitamin  D Take 1 tablet by mouth once a week.   cetirizine HCl 1 MG/ML solution Commonly known as: ZYRTEC TAKE 10 ML(10 MG) BY MOUTH DAILY   Depakote Sprinkles 125 MG capsule Generic drug: divalproex TAKE 4 CAPSULES BY MOUTH TWICE DAILY What changed: additional instructions Changed by: Elveria Rising   diazepam 20 MG Gel Commonly known as: Diastat AcuDial DIAL TO 15 MG, GIVE RECTALLY FOR SEIZURES LASTING 2 MINUTES OR LONGER   Valtoco 20 MG Dose 10 MG/0.1ML Lqpk Generic drug: diazePAM (20 MG Dose) Give 1 spray in each nostril for seizures lasting 2 minutes or longer.   fluconazole 150 MG tablet Commonly known as: DIFLUCAN Take by mouth.   fluticasone 50 MCG/ACT nasal spray Commonly known as: FLONASE SHAKE LIQUID AND USE 2 SPRAYS IN EACH NOSTRIL TWICE DAILY   furosemide 20 MG tablet Commonly known as: LASIX TAKE 1 TABLET BY MOUTH EVERY DAY AS NEEDED FOR SWELLING   Keppra 100 MG/ML solution Generic drug: levETIRAcetam TAKE 12 MLS BY MOUTH MORNING EVERY MORNING AND AT NIGHT   ketotifen  0.025 % ophthalmic solution Commonly known as: ZADITOR Apply 1 drop to eye 2 (two) times daily.   lactose free nutrition Liqd Take 237 mLs by mouth 3 (three) times daily with meals.   lactulose 10 GM/15ML solution Commonly known as: CHRONULAC TAKE 30 ML BY MOUTH TWICE DAILY   levOCARNitine 1 GM/10ML solution Commonly known as: CARNITOR TAKE 5 ML BY MOUTH THREE TIMES DAILY   liver oil-zinc oxide 40 % ointment Commonly known as: DESITIN Apply 1 application topically daily.   Nebulizer Mask Adult Misc Use with nebulizer machine when needed   Nebulizer/Pediatric Mask Kit Use with nebulizer machine as needed for intermittent wheezing and shortness of breath.   nystatin ointment Commonly known as: MYCOSTATIN Apply 1 Application topically 2 (two) times daily. Use until diaper rash gone.   pantoprazole sodium 40 mg Commonly known as: PROTONIX MIX 1 PACK AND TAKE DAILY AS DIRECTED    promethazine-dextromethorphan 6.25-15 MG/5ML syrup Commonly known as: PROMETHAZINE-DM Take 5 mLs by mouth 4 (four) times daily as needed for cough.   topiramate 25 MG capsule Commonly known as: TOPAMAX TAKE 6 CAPSULES BY MOUTH EVERY MORNING AND 6 CAPSULES BY MOUTH IN THE EVENING WITH FOOD   VITAMIN D PO Take by mouth.      Total time spent with the patient was 20 minutes, of which 50% or more was spent in counseling and coordination of care.  Elveria Rising NP-C [facility] [address], [address] [place], [place] [address] Ph. [phone] Fax [phone]

## 2023-03-01 NOTE — Patient Instructions (Addendum)
It was a pleasure to see you today!  Instructions for you until your next appointment are as follows: Reduce Michele [name]'s Depakote to 4 capsules in the morning and at night. We may continue to slowly taper this medication depending on how she tolerates the lower dose.  Let me know if she has seizures. Refills were sent to the pharmacy. Please sign up for MyChart if you have not done so. Please plan to return for follow up in 6 months or sooner if needed.  Feel free to contact our office during normal business hours at [phone] with questions or concerns. If there is no answer or the call is outside business hours, please leave a message and our clinic staff will call you back within the next business day.  If you have an urgent concern, please stay on the line for our after-hours answering service and ask for the on-call neurologist.     I also encourage you to use MyChart to communicate with me more directly. If you have not yet signed up for MyChart within [facility], the front desk staff can help you. However, please note that this inbox is NOT monitored on nights or weekends, and response can take up to 2 business days.  Urgent matters should be discussed with the on-call pediatric neurologist.   At [facility], we are committed to providing exceptional care. You will receive a patient satisfaction survey through text or email regarding your visit today. Your opinion is important to me. Comments are appreciated.

## 2023-04-20 ENCOUNTER — Telehealth: Payer: Self-pay | Admitting: Family Medicine

## 2023-04-20 NOTE — Telephone Encounter (Signed)
Legal guardian walked in stating patient needs a new referral for lymphedema with her therapist in [place].  Fax #[phone]  Please call mother [phone]

## 2023-04-21 ENCOUNTER — Other Ambulatory Visit: Payer: Self-pay | Admitting: Family Medicine

## 2023-04-21 DIAGNOSIS — I890 Lymphedema, not elsewhere classified: Secondary | ICD-10-CM

## 2023-04-21 NOTE — Progress Notes (Signed)
Called mom who is Michele [name]'s caretaker to verify what referral she needed. She discussed and verified with me that she has been seeing OT for lymphedema care Loel Dubonnet) and required [place] to send in another referral. Otherwise she has no concerns or issues that she wanted to discuss regarding her daughter.   [name], DO [facility], PGY-1 04/21/23 1:24 PM

## 2023-05-07 ENCOUNTER — Ambulatory Visit: Payer: Medicare Other | Admitting: Occupational Therapy

## 2023-05-08 ENCOUNTER — Emergency Department (HOSPITAL_COMMUNITY)
Admission: EM | Admit: 2023-05-08 | Discharge: 2023-05-09 | Disposition: A | Discharge status: Home / Self Care | Payer: Medicare Other | Attending: Emergency Medicine | Admitting: Emergency Medicine

## 2023-05-08 ENCOUNTER — Ambulatory Visit: Payer: Medicare Other

## 2023-05-08 VITALS — BP 92/65 | HR 81 | Temp 98.2°F

## 2023-05-08 DIAGNOSIS — R600 Localized edema: Secondary | ICD-10-CM

## 2023-05-08 DIAGNOSIS — B349 Viral infection, unspecified: Secondary | ICD-10-CM

## 2023-05-08 DIAGNOSIS — S82222A Displaced transverse fracture of shaft of left tibia, initial encounter for closed fracture: Secondary | ICD-10-CM | POA: Diagnosis not present

## 2023-05-08 DIAGNOSIS — Z23 Encounter for immunization: Secondary | ICD-10-CM

## 2023-05-08 DIAGNOSIS — M79672 Pain in left foot: Secondary | ICD-10-CM | POA: Diagnosis present

## 2023-05-08 MED ORDER — FUROSEMIDE 20 MG PO TABS: ORAL_TABLET | ORAL | 0 refills | Status: DC

## 2023-05-08 NOTE — Patient Instructions (Addendum)
It was great to see you today! Thank you for choosing [facility] for your primary care.  Today we addressed: I suspect your symptoms are related to a viral illness.  Thank you for getting your vaccination today.  You may continue to use Tylenol/ibuprofen as needed, this should improve with time.  Should her fever start to be more persistent, please return. I have refilled the Lasix and sent this to your pharmacy.  If you haven't already, sign up for My Chart to have easy access to your labs results, and communication with your primary care physician.  Return if symptoms worsen or fail to improve. Please arrive 15 minutes before your appointment to ensure smooth check in process.  We appreciate your efforts in making this happen.  Thank you for allowing me to participate in your care, Shelby Mattocks, DO 05/08/2023, 9:28 AM PGY-3, [facility]

## 2023-05-08 NOTE — Progress Notes (Signed)
  SUBJECTIVE:   CHIEF COMPLAINT / HPI:   Presents today with mother who acts as historian with 2 concerns.  1.  Nasal congestion/runny nose and a fever of 101 which started on Saturday.  He responded to Tylenol well and she has not had any further fever.  However the congestion in her nose has transition to being runny.  Denies cough.  Her appetite has remained appropriate.  She has not had any increased seizure activity since the fever.  2.  Requesting refill of Lasix as needed medication for lymphedema.  PERTINENT  PMH / PSH: Cerebral palsy, Lennox-Gastaut syndrome, congenital quadriplegia, hypothyroidism, osteoporosis, lymphedema  OBJECTIVE:  BP 92/65   Pulse 81   Temp 98.2 F (36.8 C)   SpO2 93%  General: Well-appearing, NAD HEENT: Clear TMs, no cervical lymphadenopathy, nasopharynx clear with clear drainage, unable to evaluate oropharynx given inability to follow commands CV: RRR, no murmurs auscultated Pulm: CTAB, normal WOB  ASSESSMENT/PLAN:   Assessment & Plan Viral illness One-time fever with nasal congestion, otherwise well-appearing.  Suspect viral illness, mother was amenable to flu and COVID vaccination today.  Recommend continued conservative management with Tylenol/ibuprofen.  It is additionally reassuring that seizure activity has not worsened in the setting of fever. Edema of extremities Reviewed documentation, continues to be stable with seldom use of Lasix as needed and compression stockings.  Will refill 90-day supply to use as needed. Return if symptoms worsen or fail to improve. Shelby Mattocks, DO 05/08/2023, 9:37 AM PGY-3, [facility]

## 2023-05-09 ENCOUNTER — Emergency Department (HOSPITAL_COMMUNITY): Payer: Medicare Other

## 2023-05-09 ENCOUNTER — Other Ambulatory Visit: Payer: Self-pay

## 2023-05-09 ENCOUNTER — Encounter (HOSPITAL_COMMUNITY): Payer: Self-pay

## 2023-05-09 ENCOUNTER — Encounter: Payer: Self-pay | Admitting: Student

## 2023-05-09 DIAGNOSIS — S82222A Displaced transverse fracture of shaft of left tibia, initial encounter for closed fracture: Secondary | ICD-10-CM | POA: Diagnosis not present

## 2023-05-09 MED ORDER — OXYCODONE HCL 5 MG PO TABS: 5.0000 mg | ORAL_TABLET | ORAL | 0 refills | Status: DC | PRN

## 2023-05-09 NOTE — ED Notes (Signed)
Pt with caregiver. Requesting to stay in wheelchair for comfort at this time.

## 2023-05-09 NOTE — ED Triage Notes (Signed)
Pt nonverbal. Hx obtained from mother.   Pt slid down from roller walker and mother suspects she injured left ankle.

## 2023-05-09 NOTE — ED Notes (Signed)
Patient discharged by this RN. Discharge instructions reviewed by this RN with no additional questions. In wheelchair to lobby with mother (legal guardian).

## 2023-05-09 NOTE — ED Notes (Signed)
Patient transported to X-ray 

## 2023-05-09 NOTE — Discharge Instructions (Addendum)
Please follow up with the ortho tumor team with [facility]. I have attached their phone number. Please call today.  I sent a few tablets of oxycodone to your pharmacy for severe pain.  Otherwise recommend Tylenol and ibuprofen.

## 2023-05-09 NOTE — Progress Notes (Signed)
Orthopedic Tech Progress Note Patient Details:  SHELANA COCKCROFT 1985/11/27 [id number]  Ortho Devices Type of Ortho Device: Long leg splint Ortho Device/Splint Location: LLE Ortho Device/Splint Interventions: Ordered, Application, Adjustment   Post Interventions Patient Tolerated: Well Instructions Provided: Care of device  [place] A Gerilyn Pilgrim 05/09/2023, 4:56 AM

## 2023-05-09 NOTE — ED Notes (Signed)
PAL line called and spoke with and setup a call for orthopedic oncology to call [place] back at [phone]

## 2023-05-09 NOTE — ED Provider Notes (Signed)
Accepted handoff at shift change from [facility] [place]. Please see prior provider note for more detail.   Briefly: Patient is 37 y.o. presenting for sliding out of her roller walker into the floor.  DDX: concern for fracture, dislocation, soft tissue injury, others   Plan: Follow-up on the left CT tib-fib.  Plan to discharge with Ortho follow-up at [facility].   Physical Exam  BP 103/69 (BP Location: Left Arm)   Pulse (!) 101   Temp 98.9 F (37.2 C)   Resp 16   SpO2 100%   Physical Exam  Procedures  Procedures  ED Course / MDM   Clinical Course as of 05/09/23 0942  Wed May 09, 2023  0631 CT read pending. Discharge with follow up with Ortho Tumor at [facility] [JR]    Clinical Course User Index [JR] Gareth Eagle, PA-C   Medical Decision Making Amount and/or Complexity of Data Reviewed Radiology: ordered.    CT findings suggestive of a malignant versus benign tumor or cyst.  Recommendations from radiologist or to follow-up with orthopedics.  Advised patient to follow-up with Ortho tomorrow at [facility] as was planned.  Discussed return precautions.  Sent a few tablets of oxycodone to her pharmacy.      Ileen, Storts, PA-C 05/09/23 1003    Terrilee Files, MD 05/09/23 [phone]

## 2023-05-09 NOTE — ED Provider Notes (Signed)
[place] EMERGENCY DEPARTMENT AT [facility] Provider Note   CSN: [id number] Arrival date & time: 05/08/23  2340     History  Chief Complaint  Patient presents with   Foot Pain    Michele [name] is a 37 y.o. female. Patient nonverbal at baseline, with history of congenital quadriplegia, cerebral palsy, osteoporosis, seizure disorder presents emergency department after sliding out of her roller walker into the floor.  Patient's mother states patient seems to be indicating that there is pain in the left foot or lower leg.   Foot Pain       Home Medications Prior to Admission medications   Medication Sig Start Date End Date Taking? Authorizing Provider  acetaminophen (TYLENOL) 500 MG tablet Take 500 mg by mouth every 6 (six) hours as needed for fever.    [provider]  acetaminophen (TYLENOL) 650 MG suppository Place 650 mg rectally every 8 (eight) hours as needed for mild pain.    [provider]  albuterol (PROVENTIL) (2.5 MG/3ML) 0.083% nebulizer solution Take 3 mLs (2.5 mg total) by nebulization every 6 (six) hours as needed for wheezing or shortness of breath. 08/22/22   Valetta Close, MD  BANZEL 40 MG/ML SUSP TAKE EVERY MORNING AND EVERY NIGHT AT BEDTIME 03/01/23   Elveria Rising, NP  Calcium Carbonate-Vitamin D (CALCARB 600/D) 600-400 MG-UNIT tablet Take 1 tablet by mouth once a week.    [provider]  cetirizine HCl (ZYRTEC) 1 MG/ML solution TAKE 10 ML(10 MG) BY MOUTH DAILY 07/21/22   Valetta Close, MD  DEPAKOTE SPRINKLES 125 MG capsule TAKE 4 CAPSULES BY MOUTH TWICE DAILY 03/01/23   Elveria Rising, NP  diazepam (DIASTAT ACUDIAL) 20 MG GEL DIAL TO 15 MG, GIVE RECTALLY FOR SEIZURES LASTING 2 MINUTES OR LONGER 09/19/21   Elveria Rising, NP  fluconazole (DIFLUCAN) 150 MG tablet Take by mouth. 08/22/22   [provider]  fluticasone (FLONASE) 50 MCG/ACT nasal spray SHAKE LIQUID AND USE 2 SPRAYS IN EACH NOSTRIL  TWICE DAILY 05/22/22   Valetta Close, MD  furosemide (LASIX) 20 MG tablet TAKE 1 TABLET BY MOUTH EVERY DAY AS NEEDED FOR SWELLING 05/08/23   Shelby Mattocks, DO  KEPPRA 100 MG/ML solution TAKE 12 MLS BY MOUTH MORNING EVERY MORNING AND AT NIGHT 03/01/23   Elveria Rising, NP  ketotifen (ZADITOR) 0.025 % ophthalmic solution Apply 1 drop to eye 2 (two) times daily. 08/22/21   [provider]  lactose free nutrition (BOOST PLUS) LIQD Take 237 mLs by mouth 3 (three) times daily with meals. 03/14/22   Littie Deeds, MD  lactulose (CHRONULAC) 10 GM/15ML solution TAKE 30 ML BY MOUTH TWICE DAILY 02/05/23   [name], DO  levOCARNitine (CARNITOR) 1 GM/10ML solution TAKE 5 ML BY MOUTH THREE TIMES DAILY 03/01/23   Elveria Rising, NP  liver oil-zinc oxide (DESITIN) 40 % ointment Apply 1 application topically daily.    [provider]  nystatin ointment (MYCOSTATIN) Apply 1 Application topically 2 (two) times daily. Use until diaper rash gone. 01/08/23   [name], DO  pantoprazole sodium (PROTONIX) 40 mg MIX 1 PACK AND TAKE DAILY AS DIRECTED 12/21/21   Valetta Close, MD  promethazine-dextromethorphan (PROMETHAZINE-DM) 6.25-15 MG/5ML syrup Take 5 mLs by mouth 4 (four) times daily as needed for cough. Patient not taking: Reported on 03/01/2023 08/07/22   Zenia Resides, MD  Respiratory Therapy Supplies (NEBULIZER MASK ADULT) MISC Use with nebulizer machine when needed 07/21/22  Valetta Close, MD  Respiratory Therapy Supplies (NEBULIZER/PEDIATRIC MASK) KIT Use with nebulizer machine as needed for intermittent wheezing and shortness of breath. 08/22/22   Valetta Close, MD  topiramate (TOPAMAX) 25 MG capsule TAKE 6 CAPSULES BY MOUTH EVERY MORNING AND 6 CAPSULES BY MOUTH IN THE EVENING WITH FOOD 03/01/23   Elveria Rising, NP  VALTOCO 20 MG DOSE 10 MG/0.1ML LQPK Give 1 spray in each nostril for seizures lasting 2 minutes or longer. 03/01/23   Elveria Rising, NP  VITAMIN D PO Take  by mouth.    [provider]      Allergies    Carbamazepine    Review of Systems   Review of Systems  Physical Exam Updated Vital Signs BP 101/69 (BP Location: Right Arm)   Pulse (!) 126   Temp 98.9 F (37.2 C)   Resp 18   SpO2 91%  Physical Exam Vitals and nursing note reviewed.  HENT:     Head: Normocephalic and atraumatic.  Eyes:     Conjunctiva/sclera: Conjunctivae normal.  Cardiovascular:     Rate and Rhythm: Normal rate.  Pulmonary:     Effort: Pulmonary effort is normal. No respiratory distress.  Musculoskeletal:        General: Tenderness and signs of injury present. No swelling or deformity.     Cervical back: Normal range of motion.  Skin:    General: Skin is dry.  Neurological:     General: No focal deficit present.     Mental Status: She is alert.  Psychiatric:        Speech: Speech normal.     ED Results / Procedures / Treatments   Labs (all labs ordered are listed, but only abnormal results are displayed) Labs Reviewed - No data to display  EKG None  Radiology DG Foot Complete Left  Result Date: 05/09/2023 CLINICAL DATA:  Injury, pain EXAM: LEFT FOOT - COMPLETE 3+ VIEW COMPARISON:  02/06/2018.  Today's tib fib series. FINDINGS: Cystic lesion with pathologic fracture in the distal tibial shaft noted. See further discussion on tib fib series. No acute bony abnormality within the foot. No foot fracture, subluxation or dislocation. IMPRESSION: No additional acute bony abnormality within the foot. Electronically Signed   By: Charlett Nose M.D.   On: 05/09/2023 02:04   DG Tibia/Fibula Left  Result Date: 05/09/2023 CLINICAL DATA:  Injury, pain EXAM: LEFT TIBIA AND FIBULA - 2 VIEW COMPARISON:  04/21/2016 FINDINGS: There is a lucent lesion within the distal left tibial shaft with associated fracture. Minimal displacement. No fibular abnormality. Diffuse soft tissue swelling. IMPRESSION: Lucent cystic lesion within the distal left tibial shaft  with resulting oblique minimally displaced fracture through the lesion. The lesion has benign characteristics. Recommend orthopedic referral. Electronically Signed   By: Charlett Nose M.D.   On: 05/09/2023 02:03    Procedures .Ortho Injury Treatment  Date/Time: 05/09/2023 6:26 AM  Performed by: Darrick Grinder, PA-C Authorized by: Darrick Grinder, PA-C   Consent:    Consent obtained:  Verbal   Consent given by:  Parent   Risks discussed:  Fracture, nerve damage, stiffness, vascular damage, restricted joint movement, recurrent dislocation and irreducible dislocationInjury location: lower leg Location details: left lower leg Injury type: fracture-dislocation Pre-procedure neurovascular assessment: neurovascularly intact Manipulation performed: no Immobilization: splint Splint type: long leg Splint Applied by: Milon Dikes Post-procedure neurovascular assessment: post-procedure neurovascularly intact       Medications Ordered in ED Medications - No data to display  ED Course/ Medical Decision Making/ A&P Clinical Course as of 05/09/23 2130  Wed May 09, 2023  0631 CT read pending. Discharge with follow up with Ortho Tumor at [facility] [JR]    Clinical Course User Index [JR] Gareth Eagle, PA-C                                 Medical Decision Making Amount and/or Complexity of Data Reviewed Radiology: ordered.   This patient presents to the ED for concern of left leg pain, this involves an extensive number of treatment options, and is a complaint that carries with it a high risk of complications and morbidity.  The differential diagnosis includes fracture, dislocation, soft tissue injury, others   Co morbidities that complicate the patient evaluation  Nonverbal, congenital quadriplegia   Additional history obtained:  Additional history obtained from patient's mother   Imaging Studies ordered:  I ordered imaging studies including plain films of the left  tibia-fibula, left foot, and CT scan of the left tibia fibula I independently visualized and interpreted imaging which showed  Lucent cystic lesion within the distal left tibial shaft with  resulting oblique minimally displaced fracture through the lesion.  The lesion has benign characteristics. Recommend orthopedic  referral.  CT scan shows: I agree with the radiologist interpretation    Consultations Obtained:  I requested consultation with the on-call orthopedic surgeon, Dr.Marchwiani,  and discussed lab and imaging findings as well as pertinent plan - they recommend: Long-leg splint, CT scan post splint, patient to follow-up with orthopedic oncology I requested consultation from the orthopedic oncology department at [facility] through the [facility] line. Dr. Kizzie Bane, pediatric orthopedist, who recommends follow up as an outpatient with ortho tumor service through [facility]   Social Determinants of Health:  Patient with Medicare for her primary health insurance type, cared for at home by her mother, nonverbal   Test / Admission - Considered:  Patient with fracture of the left tibia. Patient placed in long leg splint. CT ordered post splint at recommendation of orthopedics. Patient care transferred to Riki Sheer, PA-C at shift handoff. Plan to discharge home with recommendation from our orthopedic team and from [facility] orthopedic team for follow up with ortho tumor.          Final Clinical Impression(s) / ED Diagnoses Final diagnoses:  Closed displaced transverse fracture of shaft of left tibia, initial encounter    Rx / DC Orders ED Discharge Orders     None         Pamala Duffel 05/09/23 [id number]    Palumbo, April, MD 05/09/23 [phone]

## 2023-05-09 NOTE — ED Notes (Signed)
Patient provided snack bag and drink by this RN.

## 2023-05-21 ENCOUNTER — Telehealth: Payer: Self-pay | Admitting: Family Medicine

## 2023-05-21 ENCOUNTER — Ambulatory Visit: Payer: Medicare Other | Admitting: Occupational Therapy

## 2023-05-21 NOTE — Telephone Encounter (Signed)
Reviewed patient's certification for medical necessity form.  Placed in PCP's box.  Drusilla Kanner, CMA

## 2023-05-21 NOTE — Telephone Encounter (Signed)
Patient's mother dropped off certification for medical necessity form to be completed. Last DOS was 05/08/23/ Placed in [employer].

## 2023-05-22 ENCOUNTER — Ambulatory Visit (INDEPENDENT_AMBULATORY_CARE_PROVIDER_SITE_OTHER): Payer: Medicare Other | Admitting: Orthopaedic Surgery

## 2023-05-22 ENCOUNTER — Encounter: Payer: Self-pay | Admitting: Orthopaedic Surgery

## 2023-05-22 VITALS — BP 92/60 | HR 72

## 2023-05-22 DIAGNOSIS — M84662A Pathological fracture in other disease, left tibia, initial encounter for fracture: Secondary | ICD-10-CM | POA: Diagnosis not present

## 2023-05-22 DIAGNOSIS — M84462A Pathological fracture, left tibia, initial encounter for fracture: Secondary | ICD-10-CM | POA: Insufficient documentation

## 2023-05-22 DIAGNOSIS — M85669 Other cyst of bone, unspecified lower leg: Secondary | ICD-10-CM | POA: Insufficient documentation

## 2023-05-22 MED ORDER — OXYCODONE HCL 5 MG PO TABS: 5.0000 mg | ORAL_TABLET | ORAL | 0 refills | Status: DC | PRN

## 2023-05-22 NOTE — Addendum Note (Signed)
Addended by: Rogers Seeds on: 05/22/2023 09:05 AM   Modules accepted: Orders

## 2023-05-22 NOTE — Addendum Note (Signed)
Addended by: Eldred Manges on: 05/22/2023 08:55 AM   Modules accepted: Orders

## 2023-05-22 NOTE — Progress Notes (Addendum)
Office Visit Note   Patient: Michele [name]           Date of Birth: [birth year]           MRN: [id number] Visit Date: 05/22/2023              Requested by: [name], DO [address] [place],  [place] [address] PCP: [name], DO   Assessment & Plan: Visit Diagnoses:  1. Pathological fracture of left tibia due to other disease, initial encounter   2. Bone cyst of tibia     Plan: Will place her in a cam boot.  She will have to have the cam boot removed when she gets her MRI scan with and without contrast.  Lesion appears to have some septation.  There is some cortical thinning with slight expansion.  Previous radiographs for foot injury 2017 did not show that the lesion at the time.  No extra medullary component.  Will proceed with MRI with and without contrast.  Discussed with parents that with new fracture may be more difficult to determine exactly what the lesion is but it is have radiographic features that are more consistent with a benign lesion.  Office follow-up after MRI. GLOBAL  Follow-Up Instructions: No follow-ups on file.   Orders:  No orders of the defined types were placed in this encounter.  Meds ordered this encounter  Medications   oxyCODONE (ROXICODONE) 5 MG immediate release tablet    Sig: Take 1 tablet (5 mg total) by mouth every 4 (four) hours as needed for severe pain (pain score 7-10).    Dispense:  8 tablet    Refill:  0      Procedures: No procedures performed   Clinical Data: No additional findings.   Subjective: Chief Complaint  Patient presents with   Left Leg - Fracture    DOI 05/09/2023    HPI 37 year old female with cerebral palsy spastic diplegia household ambulator most of the time with a walker with Lennox-Gastaut syndrome.  Patient fell 2 weeks ago with left leg pain and x-rays demonstrated pathologic fracture through assist intramedullary in the tibia mid to distal third.  CT scan was performed.  Parents are with her today  and plain radiographs and CT scan was reviewed.  Patient been in a splint now for last 2 weeks which is getting loose.  Review of Systems   Objective: Vital Signs: BP 92/60   Pulse 72   Physical Exam Constitutional:      Appearance: She is well-developed.     Comments: Non conversant.  Repetitive clapping with some vocalization.  HENT:     Head: Normocephalic.     Right Ear: External ear normal.     Left Ear: External ear normal. There is no impacted cerumen.  Eyes:     Pupils: Pupils are equal, round, and reactive to light.  Neck:     Thyroid: No thyromegaly.     Trachea: No tracheal deviation.  Cardiovascular:     Rate and Rhythm: Normal rate.  Pulmonary:     Effort: Pulmonary effort is normal.  Abdominal:     Palpations: Abdomen is soft.  Musculoskeletal:     Cervical back: No rigidity.  Skin:    General: Skin is warm and dry.  Neurological:     Mental Status: She is alert and oriented to person, place, and time.     Ortho Exam left short leg splint with some loosening.  Bilateral lymphedema.  Specialty Comments:  No specialty comments available.  Imaging: Narrative & Impression  CLINICAL DATA:  Injury, pain.   EXAM: CT OF THE LOWER LEFT EXTREMITY WITHOUT CONTRAST   TECHNIQUE: Multidetector CT imaging of the lower left extremity was performed according to the standard protocol.   RADIATION DOSE REDUCTION: This exam was performed according to the departmental dose-optimization program which includes automated exposure control, adjustment of the mA and/or kV according to patient size and/or use of iterative reconstruction technique.   COMPARISON:  Same day radiographs of the left tibia and fibula at 12:47 a.m. Left ankle radiograph dated April 21, 2016.   FINDINGS: Bones/Joint/Cartilage   There is an ovoid relatively well-defined marrow replacing lesion within the mid to distal tibial diaphysis, measuring 8 cm in craniocaudal extent (series 6,  image 41) and 3 x 2.6 cm in axial dimension (series 4, image 168 and series 7, image 59). There is an associated oblique fracture extending through this lesion with approximately 4 mm anterior and 5 mm lateral displacement of the distal segment. This lesion demonstrates irregular endosteal scalloping and cortical thinning, most pronounced medially. Additional regions of severe cortical thinning are noted medially (series 4, image 176 and 180). No definite periostitis. Internal density measures approximately 56 Hounsfield units with areas of heterogenous increased density, likely representing blood products. Possible thin internal septation (series 6, image 44). No definite extraosseous soft tissue component is identified. This lesion is new since the prior exam dated November 2017. No additional osseous lesion identified. The remainder of the visualized bones are intact. The knee joint is anatomically aligned. Ankle joint is anatomically aligned   Ligaments   Ligaments are suboptimally evaluated by CT.   Muscles and Tendons No intramuscular fluid collection or hematoma. No discrete intramuscular mass.   Soft tissue Soft tissue swelling of the left lower extremity. Overlying soft tissue hematoma surrounding the mid to distal tibial diaphyseal fracture margin. No discrete soft tissue mass identified.   IMPRESSION: Mildly displaced oblique pathologic fracture involving a relatively well-defined marrow replacing lesion in the mid to distal tibial diaphysis. In addition to the associated pathologic fracture, this lesion demonstrates mildly expansile morphology, irregular endosteal scalloping, cortical thinning, and possible thin internal septation, with heterogenous internal density suggestive of blood products and possible underlying soft tissue component. No definite extraosseous soft tissue component identified. Differential considerations include, but are not limited to, fibrous  dysplasia, adamantinoma, aneurysmal bone cyst, or a simple bone cyst. Orthopedic referral is recommended.     Electronically Signed   By: Hart Robinsons M.D.   On: 05/09/2023 09:39   Narrative & Impression  CLINICAL DATA:  Injury, pain   EXAM: LEFT TIBIA AND FIBULA - 2 VIEW   COMPARISON:  04/21/2016   FINDINGS: There is a lucent lesion within the distal left tibial shaft with associated fracture. Minimal displacement. No fibular abnormality. Diffuse soft tissue swelling.   IMPRESSION: Lucent cystic lesion within the distal left tibial shaft with resulting oblique minimally displaced fracture through the lesion. The lesion has benign characteristics. Recommend orthopedic referral.     Electronically Signed   By: Charlett Nose M.D.   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) 07/06/2017   Lymphedema 01/22/2017   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   Morbid obesity (HCC) 12/24/2012   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   URINARY INCONTINENCE 02/25/2010   Full incontinence of feces 02/25/2010   Hypothyroidism 01/29/2009   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   Cerebral palsy (HCC) 01/29/2009   Osteoporosis 01/29/2009   Seizure disorder (HCC) 01/29/2009   Past Medical History:  Diagnosis Date   Acute hypoxemic respiratory failure  due to COVID-19 (HCC) 08/03/2019   Cerebral palsy (HCC)    Hypothyroidism    Lennox-Gastaut syndrome (HCC)    Osteoporosis    Pneumonia    Poor appetite 02/21/2021   Profound mental retardation    Reflux    Rickets    Seizures (HCC)    Upper respiratory symptom 06/02/2019    Family History  Problem Relation Age of Onset   Hypertension Mother    Cancer Maternal Aunt        lung   Heart disease Maternal Grandmother        Died at 6   Stroke Maternal Grandfather        died at 61   Hypertension Paternal Grandmother    Diabetes Other        MGGM   Heart disease Other        MGGF   Kidney disease Other        Maternal Great Aunt    Colon cancer Neg Hx     Past Surgical History:  Procedure Laterality Date   FOOT MASS EXCISION Right    Gastrocutaneous fistual repair  2012   PEG TUBE PLACEMENT     PEG TUBE REMOVAL  2012 ?   Social History   Occupational History   Occupation: Disabled   Tobacco Use   Smoking status: Never    Passive exposure: Never   Smokeless tobacco: Never  Vaping Use   Vaping status: Never Used  Substance and Sexual Activity   Alcohol use: No   Drug use: No   Sexual activity: Never

## 2023-05-24 NOTE — Telephone Encounter (Signed)
Completed form, but asked RN team to call patient to verify how many supplies are needed as it wasn't completed on the form. I'm fine with the patient getting as many supplies as they need, just wasn't documented on the form like it was on certain parts.   [name], DO [facility], PGY-1 05/24/23 6:30 PM

## 2023-05-25 ENCOUNTER — Other Ambulatory Visit: Payer: Self-pay | Admitting: Radiology

## 2023-05-25 ENCOUNTER — Ambulatory Visit (HOSPITAL_COMMUNITY)
Admission: RE | Admit: 2023-05-25 | Discharge: 2023-05-25 | Disposition: A | Discharge status: Home / Self Care | Payer: Medicare Other | Source: Ambulatory Visit | Attending: Orthopaedic Surgery | Admitting: Orthopaedic Surgery

## 2023-05-25 DIAGNOSIS — M85669 Other cyst of bone, unspecified lower leg: Secondary | ICD-10-CM | POA: Insufficient documentation

## 2023-05-25 DIAGNOSIS — M84662A Pathological fracture in other disease, left tibia, initial encounter for fracture: Secondary | ICD-10-CM | POA: Diagnosis present

## 2023-05-25 MED ORDER — GADOBUTROL 1 MMOL/ML IV SOLN
6.0000 mL | Freq: Once | INTRAVENOUS | Status: AC | PRN
  Administered 2023-05-25: 6 mL via INTRAVENOUS

## 2023-05-25 NOTE — Telephone Encounter (Signed)
Form was able to be completed per most recent order from [facility].   Called mother and advised of completion. Copy made and placed in batch scanning.   Veronda Prude, RN

## 2023-06-19 ENCOUNTER — Ambulatory Visit (INDEPENDENT_AMBULATORY_CARE_PROVIDER_SITE_OTHER): Payer: Medicare Other | Admitting: Orthopaedic Surgery

## 2023-06-19 ENCOUNTER — Encounter: Payer: Self-pay | Admitting: Orthopaedic Surgery

## 2023-06-19 DIAGNOSIS — M84662A Pathological fracture in other disease, left tibia, initial encounter for fracture: Secondary | ICD-10-CM

## 2023-06-19 DIAGNOSIS — M85669 Other cyst of bone, unspecified lower leg: Secondary | ICD-10-CM

## 2023-06-19 NOTE — Progress Notes (Signed)
 Office Visit Note   Patient: Michele [name]           Date of Birth: Apr 17, 1986           MRN: [id number] Visit Date: 06/19/2023              Requested by: Janna Ferrier, DO [address] [place],  [place] [address] PCP: Janna Ferrier, DO   Assessment & Plan: Visit Diagnoses:  1. Pathological fracture of left tibia due to other disease, initial encounter   2. Bone cyst of tibia     Plan: Will make orthopedic oncology referral to review imaging studies.  This has benign  appearance and there was stress fracture right tibia 2018.  Continue cam boot and patient can follow-up with me in 4 weeks.  Follow-Up Instructions: Return in about 4 weeks (around 07/17/2023).   Orders:  Orders Placed This Encounter  Procedures   Ambulatory referral to Orthopedic Surgery   No orders of the defined types were placed in this encounter.     Procedures: No procedures performed   Clinical Data: No additional findings.   Subjective: Chief Complaint  Patient presents with   Left Leg - Follow-up    MRI review    HPI 37 year old female CP spastic diplegia household ambulator with pathologic fracture through distal tibial intramedullary expansile cyst.  Patient has diagnosis of Lennox-Gastaut syndrome.  Fall date was 05/09/2023.  CT scan has been done as well as MRI scan.  Patient had similar type distal tibia intramedullary cystic lesion in the opposite right leg.  Review of Systems unchanged   Objective: Vital Signs: There were no vitals taken for this visit.  Physical Exam Constitutional:      Appearance: She is well-developed.     Comments: Non conversant.  Repetitive clapping with some vocalization.  HENT:     Head: Normocephalic.     Right Ear: External ear normal.     Left Ear: External ear normal. There is no impacted cerumen.  Eyes:     Pupils: Pupils are equal, round, and reactive to light.  Neck:     Thyroid : No thyromegaly.     Trachea: No tracheal deviation.   Cardiovascular:     Rate and Rhythm: Normal rate.  Pulmonary:     Effort: Pulmonary effort is normal.  Abdominal:     Palpations: Abdomen is soft.  Musculoskeletal:     Cervical back: No rigidity.  Skin:    General: Skin is warm and dry.  Neurological:     Mental Status: She is alert and oriented to person, place, and time.     Ortho Exam CAM boot.  No skin problems.  Specialty Comments:  No specialty comments available.  Imaging: CLINICAL DATA:  Injury, pain.   EXAM: CT OF THE LOWER LEFT EXTREMITY WITHOUT CONTRAST   TECHNIQUE: Multidetector CT imaging of the lower left extremity was performed according to the standard protocol.   RADIATION DOSE REDUCTION: This exam was performed according to the departmental dose-optimization program which includes automated exposure control, adjustment of the mA and/or kV according to patient size and/or use of iterative reconstruction technique.   COMPARISON:  Same day radiographs of the left tibia and fibula at 12:47 a.m. Left ankle radiograph dated April 21, 2016.   FINDINGS: Bones/Joint/Cartilage   There is an ovoid relatively well-defined marrow replacing lesion within the mid to distal tibial diaphysis, measuring 8 cm in craniocaudal extent (series 6, image 41) and 3 x 2.6  cm in axial dimension (series 4, image 168 and series 7, image 59). There is an associated oblique fracture extending through this lesion with approximately 4 mm anterior and 5 mm lateral displacement of the distal segment. This lesion demonstrates irregular endosteal scalloping and cortical thinning, most pronounced medially. Additional regions of severe cortical thinning are noted medially (series 4, image 176 and 180). No definite periostitis. Internal density measures approximately 56 Hounsfield units with areas of heterogenous increased density, likely representing blood products. Possible thin internal septation (series 6, image 44). No  definite extraosseous soft tissue component is identified. This lesion is new since the prior exam dated November 2017. No additional osseous lesion identified. The remainder of the visualized bones are intact. The knee joint is anatomically aligned. Ankle joint is anatomically aligned   Ligaments   Ligaments are suboptimally evaluated by CT.   Muscles and Tendons No intramuscular fluid collection or hematoma. No discrete intramuscular mass.   Soft tissue Soft tissue swelling of the left lower extremity. Overlying soft tissue hematoma surrounding the mid to distal tibial diaphyseal fracture margin. No discrete soft tissue mass identified.   IMPRESSION: Mildly displaced oblique pathologic fracture involving a relatively well-defined marrow replacing lesion in the mid to distal tibial diaphysis. In addition to the associated pathologic fracture, this lesion demonstrates mildly expansile morphology, irregular endosteal scalloping, cortical thinning, and possible thin internal septation, with heterogenous internal density suggestive of blood products and possible underlying soft tissue component. No definite extraosseous soft tissue component identified. Differential considerations include, but are not limited to, fibrous dysplasia, adamantinoma, aneurysmal bone cyst, or a simple bone cyst. Orthopedic referral is recommended.     Electronically Signed   By: Harrietta Sherry M.D.   On: 05/09/2023 09:39 Narrative & Impression  CLINICAL DATA:  Pathologic fracture of the tibia.   EXAM: MRI OF LOWER LEFT EXTREMITY WITHOUT AND WITH CONTRAST   TECHNIQUE: Multiplanar, multisequence MR imaging of the left lower extremity was performed both before and after administration of intravenous contrast.   CONTRAST:  6mL GADAVIST  GADOBUTROL  1 MMOL/ML IV SOLN   COMPARISON:  CT scan 05/09/2023   FINDINGS: As demonstrated on the recent CT scan there is a pathologic fracture through an  underlying bone lesion involving the distal 1/3 of the tibial shaft. There is also a similar lesion involving the right tibia. Both of these lesions are lytic expansive bone lesions with mixed T1 and T2 signal intensity. The right tibial lesion demonstrates mild cortical thickening. There are prior radiographs of the right tibia from 2018 which demonstrate marked cortical thickening of the tibia and fibula but no discrete bone lesion.   This would be a very unusual location for metastatic disease. Findings could be due to benign bone cysts, aneurysmal bone CIS or fibrous dysplasia.   Subperiosteal hematoma and inflammation/edema surrounding the pathologic fracture.   There is a small lesion in the right calcaneus also.   Recommend referral to orthopedic oncologist.   IMPRESSION: 1. Pathologic fracture through an underlying bone lesion involving the distal 1/3 of the left tibial shaft. There is also a similar lesion involving the right tibia. There are prior radiographs of the right tibia from 2018 which demonstrate marked cortical thickening of the tibia and fibula but no discrete bone lesion. This would be a very unusual location for metastatic disease. Findings could be due to benign bone cysts, aneurysmal bone cysts or fibrous dysplasia. Recommend referral to orthopedic oncologist. 2. Small lesion in the right calcaneus also.  Electronically Signed   By: MYRTIS Stammer M.D.   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) 07/06/2017   Lymphedema 01/22/2017   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   Morbid obesity (HCC) 12/24/2012   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   URINARY INCONTINENCE 02/25/2010   Full incontinence of feces 02/25/2010   Hypothyroidism 01/29/2009   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   Cerebral palsy (HCC) 01/29/2009   Osteoporosis 01/29/2009   Seizure disorder (HCC) 01/29/2009   Past Medical History:  Diagnosis Date   Acute hypoxemic respiratory failure due to COVID-19 (HCC) 08/03/2019   Cerebral palsy (HCC)    Hypothyroidism    Lennox-Gastaut syndrome (HCC)    Osteoporosis    Pneumonia    Poor appetite 02/21/2021   Profound mental retardation    Reflux    Rickets    Seizures (HCC)    Upper respiratory symptom 06/02/2019    Family History  Problem Relation Age of Onset   Hypertension Mother    Cancer Maternal Aunt        lung   Heart disease Maternal Grandmother        Died at 72   Stroke Maternal Grandfather        died at 67   Hypertension Paternal Grandmother    Diabetes Other        MGGM   Heart disease Other        MGGF   Kidney disease Other        Maternal Great Aunt    Colon cancer Neg Hx     Past Surgical History:  Procedure Laterality Date   FOOT MASS EXCISION Right    Gastrocutaneous fistual repair  2012   PEG TUBE PLACEMENT     PEG TUBE REMOVAL  2012 ?   Social History   Occupational History   Occupation: Disabled   Tobacco Use   Smoking status: Never    Passive exposure: Never   Smokeless tobacco: Never  Vaping Use   Vaping status: Never Used  Substance and Sexual Activity   Alcohol use: No   Drug use: No   Sexual activity: Never

## 2023-07-04 ENCOUNTER — Telehealth (INDEPENDENT_AMBULATORY_CARE_PROVIDER_SITE_OTHER): Payer: Self-pay | Admitting: Family

## 2023-07-04 NOTE — Telephone Encounter (Signed)
 Form received from front desk.  PA needed for Depakote .   Will work on PA.   SS, CCMA

## 2023-07-04 NOTE — Telephone Encounter (Signed)
 Mom dropped off annual forms from [place] for Michele [name]. She states this is for all medications and should be faxed to [facility] once completed. Forms has been place in [employer].

## 2023-07-07 ENCOUNTER — Encounter: Payer: Self-pay | Admitting: Family Medicine

## 2023-07-17 ENCOUNTER — Ambulatory Visit: Payer: Medicare Other | Admitting: Orthopaedic Surgery

## 2023-07-22 ENCOUNTER — Other Ambulatory Visit: Payer: Self-pay | Admitting: Family Medicine

## 2023-07-27 ENCOUNTER — Ambulatory Visit (INDEPENDENT_AMBULATORY_CARE_PROVIDER_SITE_OTHER): Payer: Medicare Other | Admitting: Orthopaedic Surgery

## 2023-07-27 DIAGNOSIS — M84662A Pathological fracture in other disease, left tibia, initial encounter for fracture: Secondary | ICD-10-CM

## 2023-07-27 NOTE — Progress Notes (Signed)
 38 year old female returns with her parents she has orthopedic oncology appointment 07/31/2023.  Patient is continuing a cam boot patient's been trying to stand in the cam boot.  Intramedullary lobulated cystic lesion inside the tibia.  She can follow-up with me in a month.  Disc were burned today for plain radiographs.Hx of opposite tibia lesion years ago.

## 2023-08-16 ENCOUNTER — Other Ambulatory Visit: Payer: Self-pay | Admitting: Student

## 2023-08-16 DIAGNOSIS — R600 Localized edema: Secondary | ICD-10-CM

## 2023-08-21 ENCOUNTER — Other Ambulatory Visit (INDEPENDENT_AMBULATORY_CARE_PROVIDER_SITE_OTHER): Payer: Self-pay

## 2023-08-21 ENCOUNTER — Encounter: Payer: Self-pay | Admitting: Orthopaedic Surgery

## 2023-08-21 ENCOUNTER — Ambulatory Visit (INDEPENDENT_AMBULATORY_CARE_PROVIDER_SITE_OTHER): Payer: Medicare Other | Admitting: Orthopaedic Surgery

## 2023-08-21 DIAGNOSIS — M84662A Pathological fracture in other disease, left tibia, initial encounter for fracture: Secondary | ICD-10-CM

## 2023-08-21 NOTE — Progress Notes (Signed)
 Office Visit Note   Patient: Michele [name]           Date of Birth: [birth year]           MRN: [id number] Visit Date: 08/21/2023              Requested by: [name], DO [address] [place],  [place] [address] PCP: [name], DO   Assessment & Plan: Visit Diagnoses: No diagnosis found.  Plan: Patient has follow-up visit in a month or 2 with [facility] Dr. Josefine Class.  I will release her from care she can discontinue the boot it has been 3 months and she will use her tennis shoe weightbearing as tolerated.  Follow-Up Instructions: No follow-ups on file.   Orders:  No orders of the defined types were placed in this encounter.  No orders of the defined types were placed in this encounter.     Procedures: No procedures performed   Clinical Data: No additional findings.   Subjective: No chief complaint on file.   HPI 38 year old female with Lennox-Gastaut syndrome spastic diplegia.  Fibrous lesion left tibia intramedullary with oblique fracture distal third of the tibia.  She has had CT scan, MRI and referral to see Dr. Josefine Class at [facility].  He felt this was benign lesion possible fibrous dysplasia with fracture.  She has been wearing the boot initially had a splint with a heel ulcer which is healed up.  Review of Systems all systems noncontributory.   Objective: Vital Signs: There were no vitals taken for this visit.  Physical Exam Constitutional:      Appearance: She is well-developed.  HENT:     Head: Normocephalic.     Right Ear: External ear normal.     Left Ear: External ear normal. There is no impacted cerumen.  Eyes:     Pupils: Pupils are equal, round, and reactive to light.  Neck:     Thyroid: No thyromegaly.     Trachea: No tracheal deviation.  Cardiovascular:     Rate and Rhythm: Normal rate.  Pulmonary:     Effort: Pulmonary effort is normal.  Abdominal:     Palpations: Abdomen is soft.   Musculoskeletal:     Cervical back: No rigidity.  Skin:    General: Skin is warm and dry.  Neurological:     Mental Status: She is alert and oriented to person, place, and time.  Psychiatric:        Behavior: Behavior normal.     Ortho Exam palpable callus fracture stable no motion.  No tenderness with palpation.  Healed small ulcer posterior aspect of the heel  Specialty Comments:  No specialty comments available.  Imaging: No results found.   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) 07/06/2017   Lymphedema 01/22/2017   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   Morbid obesity (HCC) 12/24/2012   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   URINARY  INCONTINENCE 02/25/2010   Full incontinence of feces 02/25/2010   Hypothyroidism 01/29/2009   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   Cerebral palsy (HCC) 01/29/2009   Osteoporosis 01/29/2009   Seizure disorder (HCC) 01/29/2009   Past Medical History:  Diagnosis Date   Acute hypoxemic respiratory failure due to COVID-19 (HCC) 08/03/2019   Cerebral palsy (HCC)    Hypothyroidism    Lennox-Gastaut syndrome (HCC)    Osteoporosis    Pneumonia    Poor appetite 02/21/2021   Profound mental retardation    Reflux    Rickets    Seizures (HCC)    Upper respiratory symptom 06/02/2019    Family History  Problem Relation Age of  Onset   Hypertension Mother    Cancer Maternal Aunt        lung   Heart disease Maternal Grandmother        Died at 31   Stroke Maternal Grandfather        died at 38   Hypertension Paternal Grandmother    Diabetes Other        MGGM   Heart disease Other        MGGF   Kidney disease Other        Maternal Great Aunt    Colon cancer Neg Hx     Past Surgical History:  Procedure Laterality Date   FOOT MASS EXCISION Right    Gastrocutaneous fistual repair  2012   PEG TUBE PLACEMENT     PEG TUBE REMOVAL  2012 ?   Social History   Occupational History   Occupation: Disabled   Tobacco Use   Smoking status: Never    Passive exposure: Never   Smokeless tobacco: Never  Vaping Use   Vaping status: Never Used  Substance and Sexual Activity   Alcohol use: No   Drug use: No   Sexual activity: Never

## 2023-08-22 ENCOUNTER — Other Ambulatory Visit (INDEPENDENT_AMBULATORY_CARE_PROVIDER_SITE_OTHER): Payer: Self-pay

## 2023-08-22 DIAGNOSIS — G40319 Generalized idiopathic epilepsy and epileptic syndromes, intractable, without status epilepticus: Secondary | ICD-10-CM

## 2023-08-22 MED ORDER — KEPPRA 100 MG/ML PO SOLN: ORAL | 5 refills | Status: DC

## 2023-08-23 ENCOUNTER — Other Ambulatory Visit (INDEPENDENT_AMBULATORY_CARE_PROVIDER_SITE_OTHER): Payer: Self-pay | Admitting: Family

## 2023-08-23 DIAGNOSIS — G40319 Generalized idiopathic epilepsy and epileptic syndromes, intractable, without status epilepticus: Secondary | ICD-10-CM

## 2023-08-30 ENCOUNTER — Encounter (INDEPENDENT_AMBULATORY_CARE_PROVIDER_SITE_OTHER): Payer: Self-pay | Admitting: Family

## 2023-08-30 ENCOUNTER — Ambulatory Visit (INDEPENDENT_AMBULATORY_CARE_PROVIDER_SITE_OTHER): Payer: Self-pay | Admitting: Family

## 2023-08-30 VITALS — HR 74 | Resp 18

## 2023-08-30 DIAGNOSIS — G40814 Lennox-Gastaut syndrome, intractable, without status epilepticus: Secondary | ICD-10-CM | POA: Diagnosis not present

## 2023-08-30 DIAGNOSIS — G801 Spastic diplegic cerebral palsy: Secondary | ICD-10-CM

## 2023-08-30 DIAGNOSIS — G40319 Generalized idiopathic epilepsy and epileptic syndromes, intractable, without status epilepticus: Secondary | ICD-10-CM

## 2023-08-30 DIAGNOSIS — F73 Profound intellectual disabilities: Secondary | ICD-10-CM

## 2023-08-30 DIAGNOSIS — Z993 Dependence on wheelchair: Secondary | ICD-10-CM

## 2023-08-30 DIAGNOSIS — N3942 Incontinence without sensory awareness: Secondary | ICD-10-CM

## 2023-08-30 DIAGNOSIS — I890 Lymphedema, not elsewhere classified: Secondary | ICD-10-CM | POA: Diagnosis not present

## 2023-08-30 NOTE — Patient Instructions (Signed)
 It was a pleasure to see you today!  Instructions for you until your next appointment are as follows: Continue Wrigley's medications as prescribed Let me know if you have any questions or concerns Please sign up for MyChart if you have not done so. Please plan to return for follow up in  or sooner if needed.  Feel free to contact our office during normal business hours at [phone] with questions or concerns. If there is no answer or the call is outside business hours, please leave a message and our clinic staff will call you back within the next business day.  If you have an urgent concern, please stay on the line for our after-hours answering service and ask for the on-call neurologist.     I also encourage you to use MyChart to communicate with me more directly. If you have not yet signed up for MyChart within [facility], the front desk staff can help you. However, please note that this inbox is NOT monitored on nights or weekends, and response can take up to 2 business days.  Urgent matters should be discussed with the on-call pediatric neurologist.   At [facility], we are committed to providing exceptional care. You will receive a patient satisfaction survey through text or email regarding your visit today. Your opinion is important to me. Comments are appreciated.

## 2023-08-30 NOTE — Progress Notes (Unsigned)
 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 She is taking and tolerating brand Depakote, Keppra, Topamax and Banzel for her seizure disorder. Michele [name] typically turns blue and requires blow by oxygen when seizures occur. She is completely dependent upon others for all activities of daily living. She is cared for at home by her mother.   Due to her medical condition, she is indefinitely incontinent of stool and urine.  It is medically necessary for her to use diapers, underpads, and gloves to assist with hygiene and skin integrity.     Today's concerns: Mom reports that Michele [name] fell at home on 05/09/2023 and has a fracture in the left tibia. She is followed by Dr Ophelia Charter for this problem. Mom reports that Michele [name] has had no convulsive seizures since her last visit. She has had occasion eye twitching and staring that is very brief and does not require intervention.  Mom denies any equipment needs today. Walter has been otherwise generally healthy since she was last seen. No health concerns today other than previously mentioned.  Review of systems: Please see HPI for neurologic and other pertinent review of systems. Otherwise all other systems were reviewed and were negative.  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) 07/06/2017   Lymphedema 01/22/2017   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   Morbid obesity (HCC) 12/24/2012   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   URINARY INCONTINENCE 02/25/2010   Full incontinence of feces 02/25/2010   Hypothyroidism 01/29/2009   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   Cerebral palsy (HCC) 01/29/2009   Osteoporosis 01/29/2009   Seizure disorder (HCC) 01/29/2009     Past Medical History:  Diagnosis Date   Acute hypoxemic respiratory failure due to COVID-19 (HCC) 08/03/2019   Cerebral palsy (HCC)    Hypothyroidism    Lennox-Gastaut syndrome (HCC)    Osteoporosis    Pneumonia    Poor appetite 02/21/2021   Profound mental retardation    Reflux    Rickets    Seizures (HCC)    Upper respiratory symptom 06/02/2019    Past medical history comments: See HPI  Surgical history: Past Surgical History:  Procedure Laterality Date   FOOT MASS EXCISION Right    Gastrocutaneous fistual repair  2012   PEG TUBE PLACEMENT     PEG TUBE REMOVAL  2012 ?    Family history: family history includes Cancer in her maternal aunt; Diabetes in an other family member; Heart disease in her maternal grandmother and another family member; Hypertension in her mother and paternal grandmother; Kidney disease in an other  family member; Stroke in her maternal grandfather.   Social history: Social History   Socioeconomic History   Marital status: Single    Spouse name: Not on file   Number of children: 0   Years of education: Not on file   Highest education level: Not on file  Occupational History    Occupation: Disabled   Tobacco Use   Smoking status: Never    Passive exposure: Never   Smokeless tobacco: Never  Vaping Use   Vaping status: Never Used  Substance and Sexual Activity   Alcohol use: No   Drug use: No   Sexual activity: Never  Other Topics Concern   Not on file  Social History Narrative   0 caffeine drinks    Lives with mother who helps ADLs.   Graduated from [employer] in 2010. Now attends day program at [employer].    Enjoys playing in water, ripping paper, watching TV, music and going out.   Social Drivers of Corporate investment banker Strain: Low Risk  (10/26/2022)   Overall Financial Resource Strain (CARDIA)    Difficulty of Paying Living Expenses: Not hard at all  Food Insecurity: No Food Insecurity (10/26/2022)   Hunger Vital Sign    Worried About Running Out of Food in the Last Year: Never true    Ran Out of Food in the Last Year: Never true  Transportation Needs: No Transportation Needs (10/26/2022)   PRAPARE - Administrator, Civil Service (Medical): No    Lack of Transportation (Non-Medical): No  Physical Activity: Not on file  Stress: Not on file  Social Connections: Not on file  Intimate Partner Violence: Not on file    Past/failed meds: Copied from previous record: Carbamazepine - rash   Allergies: Allergies  Allergen Reactions   Carbamazepine Rash    Immunizations: Immunization History  Administered Date(s) Administered   Influenza Split 04/14/2011, 03/22/2012   Influenza Whole 05/26/2009, 04/04/2010   Influenza, Seasonal, Injecte, Preservative Fre 05/08/2023   Influenza,inj,Quad PF,6+ Mos 04/03/2013, 04/04/2014, 03/08/2015, 03/08/2016, 03/26/2017, 04/12/2018, 03/03/2019, 03/26/2020, 02/18/2021, 07/21/2022   PFIZER(Purple Top)SARS-COV-2 Vaccination 11/10/2019, 12/01/2019, 05/28/2020   Pfizer Covid-19 Vaccine Bivalent Booster 21yrs & up 05/31/2021   Pfizer(Comirnaty)Fall Seasonal Vaccine 12 years and older 05/08/2023   Tdap  01/14/2014    Diagnostics/Screenings: Copied from previous record: 09/25/2022 - rEEG - This is a abnormal record with the patient in awake states due to global low amplitude activity.  No evidence of epileptic activity throughout the recording. Clinical correlation advised.  Lorenz Coaster MD MPH   Physical Exam: Pulse 74   Resp 18   General: well developed, well nourished obese woman, seated in wheelchair, in no evident distress Head: normocephalic and atraumatic. Oropharynx difficult to examine but appears benign. No dysmorphic features. Neck: supple Cardiovascular: regular rate and rhythm, no murmurs. Respiratory: clear to auscultation bilaterally Abdomen: bowel sounds present all four quadrants, abdomen soft, non-tender, non-distended. No hepatosplenomegaly or masses palpated.Gastrostomy tube in place size  Fr cm AMT MiniOne balloon button, site clean and dry Musculoskeletal: no skeletal deformities or obvious scoliosis. Has increased tone and lymphedema in the lower extremities. Wearing cam walker on left lower extremity Skin: no rashes or neurocutaneous lesions  Neurologic Exam Mental Status: awake and fully alert. Has no language.  Smiles responsively. Claps her hands frequently. Unable to follow instructions or participate in examination Cranial Nerves: fundoscopic exam - red reflex present.  Unable to fully visualize fundus.  Pupils equal briskly reactive to  light.  Turns to localize faces and objects in the periphery. Turns to localize sounds in the periphery. Facial movements are asymmetric, has lower facial weakness with drooling.  Motor: normal functional bulk, tone and strength in the upper extremities. Increased tone in the lower extremities Sensory: withdrawal x 4 Coordination: unable to adequately assess due to patient's inability to participate in examination. No dysmetria with reach for objects. Gait and Station: unable to stand and bear weight.    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 No recent diagnostic studies to be reviewed with the patient. Michele [name] is doing well at this time. Plan until next visit: Continue medications as prescribed  Call for questions or concerns Return in about 6 months (around 03/01/2024).  The medication list was reviewed and reconciled. No changes were made in the prescribed medications today. A complete medication list was provided to the patient.  Allergies as of 08/30/2023       Reactions   Carbamazepine Rash        Medication List        Accurate as of August 30, 2023 11:59 PM. If you have any questions, ask your nurse or doctor.          STOP taking these medications    acetaminophen 500 MG tablet Commonly known as: TYLENOL Stopped by: Elveria Rising   acetaminophen 650 MG suppository Commonly known as: TYLENOL Stopped by: Elveria Rising   oxyCODONE 5 MG immediate release tablet Commonly known as: Roxicodone Stopped by: Elveria Rising       TAKE these medications    albuterol (2.5 MG/3ML) 0.083% nebulizer solution Commonly known as: PROVENTIL Take 3 mLs (2.5 mg total) by nebulization every 6 (six) hours as needed for wheezing or shortness of breath.   Banzel 40 MG/ML Susp Generic drug: Rufinamide TAKE EVERY MORNING AND EVERY NIGHT AT BEDTIME   Calcarb 600/D 600-400 MG-UNIT Tabs Generic drug: Calcium  Carbonate-Vitamin D Take 1 tablet by mouth daily.   cetirizine HCl 1 MG/ML solution Commonly known as: ZYRTEC TAKE 10 ML(10 MG) BY MOUTH DAILY What changed: See the new instructions.   Depakote Sprinkles 125 MG capsule Generic drug: divalproex TAKE 4 CAPSULES BY MOUTH TWICE DAILY   esomeprazole 40 MG capsule Commonly known as: NEXIUM Take 40 mg by mouth daily as needed (acid reflux).   fluticasone 50 MCG/ACT nasal spray Commonly known as: FLONASE SHAKE LIQUID AND USE 2 SPRAYS IN EACH NOSTRIL TWICE DAILY What changed: See the new instructions.   furosemide 20 MG tablet Commonly known as: LASIX TAKE 1 TABLET BY MOUTH EVERY DAY AS NEEDED FOR SWELLING   Keppra 100 MG/ML solution Generic drug: levETIRAcetam TAKE 12 MLS BY MOUTH MORNING EVERY MORNING AND AT NIGHT   ketotifen 0.025 % ophthalmic solution Commonly known as: ZADITOR Place 1 drop into both eyes 2 (two) times daily as needed (eye irritation).   lactose free nutrition Liqd Take 237 mLs by mouth 3 (three) times daily with meals.   lactulose 10 GM/15ML solution Commonly known as: CHRONULAC TAKE 30 ML BY MOUTH TWICE  DAILY   levOCARNitine 1 GM/10ML solution Commonly known as: CARNITOR TAKE 5 ML BY MOUTH THREE TIMES DAILY   liver oil-zinc oxide 40 % ointment Commonly known as: DESITIN Apply 1 application  topically daily as needed for irritation.   Nebulizer Mask Adult Misc Use with nebulizer machine when needed   Nebulizer/Pediatric Mask Kit Use with nebulizer machine as needed for intermittent wheezing and shortness of breath.   nystatin ointment Commonly known as: MYCOSTATIN Apply 1 Application topically 2 (two) times daily. Use until diaper rash gone.   topiramate 25 MG capsule Commonly known as: Topamax Sprinkle TAKE 6 CAPSULES BY MOUTH EVERY MORNING AND TAKE 6 CAPSULES EVERY EVENING WITH FOOD   Valtoco 20 MG Dose 10 MG/0.1ML Lqpk Generic drug: diazePAM (20 MG Dose) Give 1 spray in each nostril for  seizures lasting 2 minutes or longer.   VITAMIN D PO Take 1 tablet by mouth daily.      Total time spent with the patient was 30 minutes, of which 50% or more was spent in counseling and coordination of care.  Elveria Rising NP-C [facility] [address] [place], [place] [address] Ph. [phone] Fax [phone]

## 2023-08-31 ENCOUNTER — Encounter (INDEPENDENT_AMBULATORY_CARE_PROVIDER_SITE_OTHER): Payer: Self-pay | Admitting: Family

## 2023-08-31 MED ORDER — KEPPRA 100 MG/ML PO SOLN: ORAL | 5 refills | Status: DC

## 2023-08-31 MED ORDER — BANZEL 40 MG/ML PO SUSP: ORAL | 5 refills | Status: DC

## 2023-08-31 MED ORDER — TOPIRAMATE 25 MG PO CPSP: ORAL_CAPSULE | ORAL | 5 refills | Status: DC

## 2023-08-31 MED ORDER — LEVOCARNITINE 1 GM/10ML PO SOLN: ORAL | 5 refills | Status: DC

## 2023-08-31 MED ORDER — DEPAKOTE SPRINKLES 125 MG PO CSDR: DELAYED_RELEASE_CAPSULE | ORAL | 5 refills | Status: DC

## 2023-09-06 ENCOUNTER — Ambulatory Visit: Payer: Medicare Other | Admitting: Family Medicine

## 2023-09-07 NOTE — Progress Notes (Signed)
    SUBJECTIVE:   Chief compliant/HPI: annual examination  Michele [name] is a 38 y.o. who presents today for an annual exam.   Review of systems unremarkable.  Updated history tabs and problem list.   OBJECTIVE:   BP 113/84   Pulse (!) 56   SpO2 96%   General: Awake and Alert in NAD HEENT: NCAT. Sclera anicteric. No rhinorrhea. Cardiovascular: RRR. No M/R/G Respiratory: CTAB, normal WOB on RA. No wheezing, crackles, rhonchi, or diminished breath sounds. Abdomen: Soft, non-tender, non-distended. Bowel sounds normoactive Extremities: Able to move all extremities. No BLE edema, no deformities or significant joint findings. Skin: Warm and dry. No abrasions or rashes noted. Neuro: No focal neurological deficits.  ASSESSMENT/PLAN:   Assessment & Plan Annual physical exam Doing well, no concerns today. Has been following up with specialist as needed.  Prediabetes A1c 5.7 in 07/2022. - A1c today Hypothyroidism, unspecified type Previously had hypothyroidism and TSH of 5.118 which improved without medications. Will reassess today. 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  - CBC, CMP, Vit B12, Folate Elevated LDL cholesterol level Elevated LDL to 106 in 07/2022.  - Lipid panel Abnormal CBC Previously had mild leukocytosis on labs - CBC    Annual Examination  Considered the following items based upon USPSTF recommendations: Lipid panel (nonfasting or fasting) discussed based upon AHA recommendations and ordered.  Consider repeat every 4-6 years.   Cervical cancer screening: not sexually active, discussed and declined.  Immunizations: none  Follow up in 1 year or sooner if indicated for annual physical.  [name], DO [facility]

## 2023-09-13 ENCOUNTER — Ambulatory Visit: Admitting: Family Medicine

## 2023-09-13 VITALS — BP 113/84 | HR 56

## 2023-09-13 DIAGNOSIS — E039 Hypothyroidism, unspecified: Secondary | ICD-10-CM

## 2023-09-13 DIAGNOSIS — G40814 Lennox-Gastaut syndrome, intractable, without status epilepticus: Secondary | ICD-10-CM | POA: Diagnosis not present

## 2023-09-13 DIAGNOSIS — E7800 Pure hypercholesterolemia, unspecified: Secondary | ICD-10-CM | POA: Diagnosis not present

## 2023-09-13 DIAGNOSIS — R7303 Prediabetes: Secondary | ICD-10-CM | POA: Diagnosis not present

## 2023-09-13 DIAGNOSIS — Z0000 Encounter for general adult medical examination without abnormal findings: Secondary | ICD-10-CM | POA: Diagnosis present

## 2023-09-13 DIAGNOSIS — R7989 Other specified abnormal findings of blood chemistry: Secondary | ICD-10-CM | POA: Diagnosis not present

## 2023-09-13 LAB — POCT GLYCOSYLATED HEMOGLOBIN (HGB A1C): Hemoglobin A1C: 5.5 % (ref 4.0–5.6)

## 2023-09-13 MED ORDER — NYSTATIN 100000 UNIT/GM EX POWD: 1.0000 | Freq: Three times a day (TID) | CUTANEOUS | 3 refills | Status: DC

## 2023-09-13 NOTE — Assessment & Plan Note (Signed)
 On numerous antiepileptic medications.  - CBC, CMP, Vit B12, Folate

## 2023-09-13 NOTE — Addendum Note (Signed)
 Addended by: Jennette Bill on: 09/13/2023 03:05 PM   Modules accepted: Orders

## 2023-09-13 NOTE — Patient Instructions (Addendum)
 It was great to see you today! Thank you for choosing [facility] for your primary care. Michele [name] was seen for annual physical.  Today we addressed: Lab work Please schedule Medicare annual wellness visit in May.   If you haven't already, sign up for My Chart to have easy access to your labs results, and communication with your primary care physician.  We are checking some labs today. If they are abnormal, I will call you. If they are normal, I will send you a MyChart message (if it is active) or a letter in the mail. If you do not hear about your labs in the next 2 weeks, please call the office.  You should return to our clinic Return in about 1 year (around 09/12/2024) for phsycial. Please arrive 15 minutes before your appointment to ensure smooth check in process.  We appreciate your efforts in making this happen.  Thank you for allowing me to participate in your care, [name], DO 09/13/2023, 2:37 PM PGY-1, [facility]

## 2023-09-13 NOTE — Assessment & Plan Note (Signed)
 Previously had hypothyroidism and TSH of 5.118 which improved without medications. Will reassess today. - TSH

## 2023-09-14 LAB — LIPID PANEL
Chol/HDL Ratio: 3.3 ratio (ref 0.0–4.4)
Cholesterol, Total: 189 mg/dL (ref 100–199)
HDL: 57 mg/dL (ref >39)
LDL Chol Calc (NIH): 111 mg/dL — ABNORMAL HIGH (ref 0–99)
Triglycerides: 121 mg/dL (ref 0–149)
VLDL Cholesterol Cal: 21 mg/dL (ref 5–40)

## 2023-09-14 LAB — COMPREHENSIVE METABOLIC PANEL WITH GFR
ALT: 11 IU/L (ref 0–32)
AST: 16 IU/L (ref 0–40)
Albumin: 3.7 g/dL — ABNORMAL LOW (ref 3.9–4.9)
Alkaline Phosphatase: 103 IU/L (ref 44–121)
BUN/Creatinine Ratio: 15 (ref 9–23)
BUN: 14 mg/dL (ref 6–20)
Bilirubin Total: <0.2 mg/dL (ref 0.0–1.2)
CO2: 24 mmol/L (ref 20–29)
Calcium: 9.7 mg/dL (ref 8.7–10.2)
Chloride: 106 mmol/L (ref 96–106)
Creatinine, Ser: 0.94 mg/dL (ref 0.57–1.00)
Globulin, Total: 3 g/dL (ref 1.5–4.5)
Glucose: 91 mg/dL (ref 70–99)
Potassium: 4.1 mmol/L (ref 3.5–5.2)
Sodium: 145 mmol/L — ABNORMAL HIGH (ref 134–144)
Total Protein: 6.7 g/dL (ref 6.0–8.5)
eGFR: 80 mL/min/{1.73_m2} (ref >59)

## 2023-09-14 LAB — CBC
Hematocrit: 44.1 % (ref 34.0–46.6)
Hemoglobin: 14.5 g/dL (ref 11.1–15.9)
MCH: 31.2 pg (ref 26.6–33.0)
MCHC: 32.9 g/dL (ref 31.5–35.7)
MCV: 95 fL (ref 79–97)
Platelets: 185 10*3/uL (ref 150–450)
RBC: 4.65 x10E6/uL (ref 3.77–5.28)
RDW: 14.4 % (ref 11.7–15.4)
WBC: 10.5 10*3/uL (ref 3.4–10.8)

## 2023-09-14 LAB — VITAMIN B12: Vitamin B-12: 1269 pg/mL — ABNORMAL HIGH (ref 232–1245)

## 2023-09-14 LAB — TSH: TSH: 3.92 u[IU]/mL (ref 0.450–4.500)

## 2023-09-14 LAB — FOLATE: Folate: 6.9 ng/mL (ref >3.0)

## 2023-09-15 ENCOUNTER — Other Ambulatory Visit: Payer: Self-pay | Admitting: Family Medicine

## 2023-09-15 DIAGNOSIS — R600 Localized edema: Secondary | ICD-10-CM

## 2023-09-23 ENCOUNTER — Other Ambulatory Visit: Payer: Self-pay | Admitting: Family Medicine

## 2023-09-23 DIAGNOSIS — G40319 Generalized idiopathic epilepsy and epileptic syndromes, intractable, without status epilepticus: Secondary | ICD-10-CM

## 2023-09-23 NOTE — Progress Notes (Signed)
 Called Michele [name]'s mom and relayed her lab results. Discussed dietary modifications for her elevated cholesterol.   Confirmed that Michele [name] is taking Vit B12 500 mcg over the counter. Advised her to discontinue the B12 supplement for now since her B12 is high, she understood and agreed. Advised to follow up in 3 month to make a lab appt to get B12 redrawn to check level.  [name], DO 09/23/23 1:00 PM

## 2023-10-16 ENCOUNTER — Ambulatory Visit

## 2023-10-16 ENCOUNTER — Ambulatory Visit
Admission: RE | Admit: 2023-10-16 | Discharge: 2023-10-16 | Disposition: A | Discharge status: Home / Self Care | Source: Ambulatory Visit | Attending: Family Medicine | Admitting: Family Medicine

## 2023-10-16 ENCOUNTER — Other Ambulatory Visit: Payer: Self-pay | Admitting: Family Medicine

## 2023-10-16 VITALS — BP 108/76 | HR 87 | Temp 98.4°F | Ht 59.0 in

## 2023-10-16 DIAGNOSIS — R051 Acute cough: Secondary | ICD-10-CM

## 2023-10-16 MED ORDER — CETIRIZINE HCL 1 MG/ML PO SOLN: ORAL | 6 refills | Status: DC

## 2023-10-16 NOTE — Progress Notes (Signed)
    SUBJECTIVE:   CHIEF COMPLAINT / HPI:   Cough:  - has RAD but no formal dx of asthma  - The patient presents with a week's history of cough and nasal congestion.  Congestion and cough have persisted for a week despite the use of Tisseel DM, Flonase , and a humidifier. Fever began on Monday, managed with a 25 mg suppository and Motrin, and has since resolved. The highest temperature was 100.14F, with the last low-grade fever recorded at 23F yesterday. No current fever is present.  She regularly uses Flonase  for allergies and albuterol  as needed for reactive airway symptoms. There is no shortness of breath or wheezing.  A COVID-19 swab performed at home was negative. She has not received a COVID-19 vaccination. Her mother is concerned about potential exposure to illness from visiting great nieces over the weekend.  PERTINENT  PMH / PSH: Lennox Gastaut Congenital quadriplegia  Wheelchair dependence   OBJECTIVE:   BP 108/76   Pulse 87   Temp 98.4 F (36.9 C) (Axillary)   Ht 4\' 11"  (1.499 m)   SpO2 93%   BMI 42.41 kg/m   General: Alert in no apparent distress, in wheelchair, clapping hands, non toxic Heart: Regular rate and rhythm with no murmurs appreciated Lungs: CTA bilaterally, no wheezing Abdomen: no abdominal pain, no grimacing with palpation Skin: Warm and dry   ASSESSMENT/PLAN:   Assessment & Plan Acute cough Likely viral etiology of symptoms. Persistent nasal congestion despite treatment. Differential includes viral infection and possible pneumonia. Chest x-ray indicated due to symptom duration and exposure risk. - Order chest x-ray at [facility] to rule out pneumonia (will obtain today) - Refill cetirizine  for nasal congestion and mucus clearance. - Strict ED return precautions     Ernestina Headland, MD [facility]

## 2023-10-16 NOTE — Patient Instructions (Signed)
 It was great to see you today! Thank you for choosing [facility] for your primary care.  Today we addressed: Go get chest xray today at [address]  Zyrtec  refill Return in 1 week  If you haven't already, sign up for My Chart to have easy access to your labs results, and communication with your primary care physician.   Please arrive 15 minutes before your appointment to ensure smooth check in process.  We appreciate your efforts in making this happen.  Thank you for allowing me to participate in your care, Ernestina Headland, MD 10/16/2023, 9:21 AM PGY-3, [facility]

## 2023-10-17 ENCOUNTER — Other Ambulatory Visit: Payer: Self-pay | Admitting: Student

## 2023-10-17 MED ORDER — CLOTRIMAZOLE 1 % VA CREA: 1.0000 | TOPICAL_CREAM | Freq: Every day | VAGINAL | 0 refills | Status: AC

## 2023-10-17 MED ORDER — AMOXICILLIN-POT CLAVULANATE 250-62.5 MG/5ML PO SUSR: 500.0000 mg | Freq: Three times a day (TID) | ORAL | 0 refills | Status: AC

## 2023-10-18 LAB — COVID-19, FLU A+B AND RSV
Influenza A, NAA: NOT DETECTED
Influenza B, NAA: NOT DETECTED
RSV, NAA: NOT DETECTED
SARS-CoV-2, NAA: NOT DETECTED

## 2023-10-18 LAB — SPECIMEN STATUS REPORT

## 2023-10-19 ENCOUNTER — Encounter: Payer: Self-pay | Admitting: Student

## 2023-10-23 ENCOUNTER — Ambulatory Visit: Admitting: Student

## 2023-10-23 ENCOUNTER — Encounter: Payer: Self-pay | Admitting: Student

## 2023-10-23 VITALS — BP 98/79 | HR 75 | Ht 59.0 in

## 2023-10-23 DIAGNOSIS — J189 Pneumonia, unspecified organism: Secondary | ICD-10-CM

## 2023-10-23 NOTE — Patient Instructions (Addendum)
 It was great to see you today! Thank you for choosing [facility] for your primary care.  Today we addressed: Finish antibiotic Monitor symptoms and call if there is any worsening  Keep treating for allergies as needed  If you haven't already, sign up for My Chart to have easy access to your labs results, and communication with your primary care physician.   Please arrive 15 minutes before your appointment to ensure smooth check in process.  We appreciate your efforts in making this happen.  Thank you for allowing me to participate in your care, Ernestina Headland, MD 10/23/2023, 9:26 AM PGY-3, [facility]

## 2023-10-23 NOTE — Progress Notes (Signed)
    SUBJECTIVE:   CHIEF COMPLAINT / HPI:   Cough  PNA: - Follow up: started on Augmentin  and given fluconazole  for yeast infection on last visit as CXR showed PNA  - She is on the last day of her antibiotic treatment and mom reports feeling better. Her breathing is stable, and she has not experienced any fevers during the course of treatment.  - Her blood pressure is slightly low, consistent with her baseline readings at home, typically in the 'ninety something' range.  PERTINENT  PMH / PSH:  Lennox Gastaut Congenital quadriplegia  Wheelchair dependence   OBJECTIVE:   BP 98/79   Pulse 75   Ht 4\' 11"  (1.499 m)   LMP  (LMP Unknown)   SpO2 97%   BMI 42.41 kg/m   General: Alert and oriented in no apparent distress Heart: Regular rate and rhythm with no murmurs appreciated Lungs: CTA bilaterally, no wheezing Abdomen:  no abdominal pain Skin: Warm and dry   ASSESSMENT/PLAN:   Assessment & Plan Pneumonia due to infectious organism, unspecified laterality, unspecified part of lung Finish Augmentin  course, much improved. Return in 1-2 months or earlier if showing any recurrent symptoms. Strict return precautions.      Ernestina Headland, MD [facility]

## 2023-11-13 ENCOUNTER — Other Ambulatory Visit: Payer: Self-pay | Admitting: Student

## 2023-11-13 DIAGNOSIS — Z0000 Encounter for general adult medical examination without abnormal findings: Secondary | ICD-10-CM

## 2023-11-15 ENCOUNTER — Ambulatory Visit

## 2023-11-15 VITALS — Ht 59.0 in

## 2023-11-15 DIAGNOSIS — Z0000 Encounter for general adult medical examination without abnormal findings: Secondary | ICD-10-CM

## 2023-11-15 NOTE — Patient Instructions (Signed)
 Michele [name] , Thank you for taking time out of your busy schedule to complete your Annual Wellness Visit with me. I enjoyed our conversation and look forward to speaking with you again next year. I, as well as your care team,  appreciate your ongoing commitment to your health goals. Please review the following plan we discussed and let me know if I can assist you in the future. Your Game plan/ To Do List    Referrals: If you haven't heard from the office you've been referred to, please reach out to them at the phone provided.   Follow up Visits: Next Medicare AWV with our clinical staff: 11/17/2024 at 9:10 a.m. phone visit with Nurse Health Advisor   Have you seen your provider in the last 6 months (3 months if uncontrolled diabetes)? Yes Next Office Visit with your provider: Patient's mother will schedule when due for follow-up  Clinician Recommendations:  Aim for 30 minutes of exercise or brisk walking, 6-8 glasses of water, and 5 servings of fruits and vegetables each day.       This is a list of the screening recommended for you and due dates:  Health Maintenance  Topic Date Due   Pneumococcal Vaccination (1 of 2 - PCV) Never done   Pap with HPV screening  Never done   COVID-19 Vaccine (6 - Pfizer risk 2024-25 season) 11/05/2023   DTaP/Tdap/Td vaccine (2 - Td or Tdap) 01/15/2024   Flu Shot  01/18/2024   Medicare Annual Wellness Visit  11/14/2024   Hepatitis C Screening  Completed   HIV Screening  Completed   HPV Vaccine  Aged Out   Meningitis B Vaccine  Aged Out    Advanced directives: (Copy Requested) Please bring a copy of your health care power of attorney and living will to the office to be added to your chart at your convenience. You can mail to [facility] [address] [place], [place] [address] or email to [email or web address] Advance Care Planning is important because it:  [x]  Makes sure you receive the medical care that is consistent with your values,  goals, and preferences  [x]  It provides guidance to your family and loved ones and reduces their decisional burden about whether or not they are making the right decisions based on your wishes.  Follow the link provided in your after visit summary or read over the paperwork we have mailed to you to help you started getting your Advance Directives in place. If you need assistance in completing these, please reach out to us  so that we can help you!  See attachments for Preventive Care and Fall Prevention Tips.

## 2023-11-15 NOTE — Progress Notes (Signed)
 Because this visit was a virtual/telehealth visit,  certain criteria was not obtained, such a blood pressure, CBG if applicable, and timed get up and go. Any medications not marked as "taking" were not mentioned during the medication reconciliation part of the visit. Any vitals not documented were not able to be obtained due to this being a telehealth visit or patient was unable to self-report a recent blood pressure reading due to a lack of equipment at home via telehealth. Vitals that have been documented are verbally provided by the patient.   Subjective:   Michele [name] is a 38 y.o. who presents for a Medicare Wellness preventive visit.  As a reminder, Annual Wellness Visits don't include a physical exam, and some assessments may be limited, especially if this visit is performed virtually. We may recommend an in-person follow-up visit with your provider if needed.  Visit Complete: Virtual I connected with  Michele [name] on 11/15/23 by a audio enabled telemedicine application and verified that I am speaking with the correct person using two identifiers.  Patient Location: Home  Provider Location: Office/Clinic  I discussed the limitations of evaluation and management by telemedicine. The patient expressed understanding and agreed to proceed.  Vital Signs: Because this visit was a virtual/telehealth visit, some criteria may be missing or patient reported. Any vitals not documented were not able to be obtained and vitals that have been documented are patient reported.  VideoDeclined- This patient declined Librarian, academic. Therefore the visit was completed with audio only.  Persons Participating in Visit: Mother and patient was present during visit.  AWV Questionnaire: No: Patient Medicare AWV questionnaire was not completed prior to this visit.  Cardiac Risk Factors include: sedentary lifestyle;family history of premature cardiovascular disease      Objective:     Today's Vitals   11/15/23 0835  Height: 4\' 11"  (1.499 m)  PainSc: 0-No pain   Body mass index is 42.41 kg/m.     11/15/2023    8:36 AM 10/23/2023    9:02 AM 10/16/2023    9:09 AM 05/09/2023   12:25 AM 09/12/2022    1:16 AM 11/08/2021    9:27 AM 07/21/2021    8:55 AM  Advanced Directives  Does Patient Have a Medical Advance Directive? Yes No No No No No No  Type of Estate agent of [place];Living will        Copy of Healthcare Power of Attorney in Chart? No - copy requested        Would patient like information on creating a medical advance directive? No - Patient declined No - Patient declined No - Patient declined No - Patient declined  No - Guardian declined No - Patient declined    Current Medications (verified) Outpatient Encounter Medications as of 11/15/2023  Medication Sig   albuterol  (PROVENTIL ) (2.5 MG/3ML) 0.083% nebulizer solution USE 1 VIAL VIA NEBULIZER EVERY 6 HOURS AS NEEDED FOR WHEEZING OR SHORTNESS OF BREATH   BANZEL  40 MG/ML SUSP TAKE EVERY MORNING AND 5MLS EVERY NIGHT AT BEDTIME   Calcium  Carbonate-Vitamin D  (CALCARB 600/D) 600-400 MG-UNIT tablet Take 1 tablet by mouth daily.   cetirizine  HCl (ZYRTEC ) 1 MG/ML solution TAKE 10 ML(10 MG) BY MOUTH DAILY   clotrimazole  (GYNE-LOTRIMIN ) 1 % vaginal cream Place 1 Applicatorful vaginally at bedtime.   DEPAKOTE  SPRINKLES 125 MG capsule TAKE 4 CAPSULES BY MOUTH TWICE DAILY   esomeprazole  (NEXIUM ) 40 MG capsule Take 40 mg by mouth daily as  needed (acid reflux).   fluticasone  (FLONASE ) 50 MCG/ACT nasal spray SHAKE LIQUID AND USE 2 SPRAYS IN EACH NOSTRIL TWICE DAILY   furosemide  (LASIX ) 20 MG tablet TAKE 1 TABLET BY MOUTH EVERY DAY AS NEEDED FOR SWELLING   KEPPRA  100 MG/ML solution TAKE 12 MLS BY MOUTH MORNING EVERY MORNING AND AT NIGHT   ketotifen  (ZADITOR ) 0.025 % ophthalmic solution Place 1 drop into both eyes 2 (two) times daily as needed (eye irritation).   lactose free nutrition  (BOOST PLUS) LIQD Take 237 mLs by mouth 3 (three) times daily with meals.   lactulose  (CHRONULAC ) 10 GM/15ML solution TAKE 30 ML BY MOUTH TWICE DAILY   levOCARNitine  (CARNITOR ) 1 GM/10ML solution TAKE 5 ML BY MOUTH THREE TIMES DAILY   liver oil-zinc  oxide (DESITIN) 40 % ointment Apply 1 application  topically daily as needed for irritation.   nystatin  powder Apply 1 Application topically 3 (three) times daily.   Respiratory Therapy Supplies (NEBULIZER MASK ADULT) MISC Use with nebulizer machine when needed   Respiratory Therapy Supplies (NEBULIZER/PEDIATRIC MASK) KIT Use with nebulizer machine as needed for intermittent wheezing and shortness of breath.   topiramate  (TOPAMAX  SPRINKLE) 25 MG capsule TAKE 6 CAPSULES BY MOUTH EVERY MORNING AND TAKE 6 CAPSULES EVERY EVENING WITH FOOD   VALTOCO  20 MG DOSE 10 MG/0.1ML LQPK Give 1 spray in each nostril for seizures lasting 2 minutes or longer.   VITAMIN D  PO Take 1 tablet by mouth daily.   No facility-administered encounter medications on file as of 11/15/2023.    Allergies (verified) Carbamazepine    History: Past Medical History:  Diagnosis Date   Acute hypoxemic respiratory failure due to COVID-19 (HCC) 08/03/2019   Cerebral palsy (HCC)    Hypothyroidism    Lennox-Gastaut syndrome (HCC)    Osteoporosis    Pneumonia    Poor appetite 02/21/2021   Profound mental retardation    Reflux    Rickets    Seizures (HCC)    Upper respiratory symptom 06/02/2019   Past Surgical History:  Procedure Laterality Date   FOOT MASS EXCISION Right    Gastrocutaneous fistual repair  2012   PEG TUBE PLACEMENT     PEG TUBE REMOVAL  2012 ?   Family History  Problem Relation Age of Onset   Hypertension Mother    Cancer Maternal Aunt        lung   Heart disease Maternal Grandmother        Died at 62   Stroke Maternal Grandfather        died at 33   Hypertension Paternal Grandmother    Diabetes Other        MGGM   Heart disease Other        MGGF    Kidney disease Other        Maternal Great Aunt    Colon cancer Neg Hx    Social History   Socioeconomic History   Marital status: Single    Spouse name: Not on file   Number of children: 0   Years of education: Not on file   Highest education level: Not on file  Occupational History   Occupation: Disabled   Tobacco Use   Smoking status: Never    Passive exposure: Never   Smokeless tobacco: Never  Vaping Use   Vaping status: Never Used  Substance and Sexual Activity   Alcohol use: No   Drug use: No   Sexual activity: Never  Other Topics Concern   Not  on file  Social History Narrative   0 caffeine drinks    Lives with mother who helps ADLs.   Graduated from [employer] in 2010. Now attends day program at [employer].    Enjoys playing in water, ripping paper, watching TV, music and going out.   Social Drivers of Corporate investment banker Strain: Low Risk  (11/15/2023)   Overall Financial Resource Strain (CARDIA)    Difficulty of Paying Living Expenses: Not hard at all  Food Insecurity: No Food Insecurity (11/15/2023)   Hunger Vital Sign    Worried About Running Out of Food in the Last Year: Never true    Ran Out of Food in the Last Year: Never true  Transportation Needs: No Transportation Needs (11/15/2023)   PRAPARE - Administrator, Civil Service (Medical): No    Lack of Transportation (Non-Medical): No  Physical Activity: Inactive (11/15/2023)   Exercise Vital Sign    Days of Exercise per Week: 0 days    Minutes of Exercise per Session: 0 min  Stress: Patient Unable To Answer (11/15/2023)   [employer] of Occupational Health - Occupational Stress Questionnaire    Feeling of Stress : Patient unable to answer  Social Connections: Patient Unable To Answer (11/15/2023)   Social Connection and Isolation Panel [NHANES]    Frequency of Communication with Friends and Family: Patient unable to answer    Frequency of Social Gatherings with Friends and Family:  Patient unable to answer    Attends Religious Services: Patient unable to answer    Active Member of Clubs or Organizations: Patient unable to answer    Attends Banker Meetings: Patient unable to answer    Marital Status: Patient unable to answer    Tobacco Counseling Counseling given: Not Answered    Clinical Intake:  Pre-visit preparation completed: Yes  Pain : No/denies pain Pain Score: 0-No pain     Nutritional Risks: None Diabetes: No  Lab Results  Component Value Date   HGBA1C 5.5 09/13/2023   HGBA1C 5.7 (H) 07/21/2022   HGBA1C 5.3 03/15/2012     How often do you need to have someone help you when you read instructions, pamphlets, or other written materials from your doctor or pharmacy?: 1 - Never  Interpreter Needed?: No      Activities of Daily Living     11/15/2023    8:41 AM  In your present state of health, do you have any difficulty performing the following activities:  Hearing? 1  Vision? 1  Difficulty concentrating or making decisions? 1  Walking or climbing stairs? 1  Dressing or bathing? 1  Doing errands, shopping? 1  Preparing Food and eating ? Y  Using the Toilet? Y  In the past six months, have you accidently leaked urine? Y  Do you have problems with loss of bowel control? Y  Managing your Medications? Y  Managing your Finances? Y  Housekeeping or managing your Housekeeping? Y    Patient Care Team: Clyda Dark, DO as PCP - General Lyndol Santee, NP as Nurse Practitioner (Neurology)  Indicate any recent Medical Services you may have received from other than [facility] providers in the past year (date may be approximate).     Assessment:    This is a routine wellness examination for Michele [name].  Hearing/Vision screen Hearing Screening - Comments:: No hearing issues. Vision Screening - Comments:: No eyeglasses.   Goals Addressed  This Visit's Progress    Mother understands the importance of follow-up  with providers by attending scheduled visits.         Depression Screen     11/15/2023    8:42 AM 10/23/2023    9:01 AM 10/23/2023    8:57 AM 10/16/2023    9:09 AM 09/13/2023    2:08 PM 08/22/2022    9:15 AM 07/21/2022    8:37 AM  PHQ 2/9 Scores  PHQ - 2 Score    0 0 2 0  PHQ- 9 Score    2 0 5 3  Exception Documentation Other- indicate reason in comment box Patient refusal Patient refusal   Other- indicate reason in comment box   Not completed Patient not able to answer due to cognitive impairment.     Pt non verbal, mother filled out form     Fall Risk     11/15/2023    8:40 AM 10/26/2022    8:49 AM 01/09/2019    2:14 PM 05/23/2018    2:00 PM 05/23/2018    1:50 PM  Fall Risk   Falls in the past year? 1 0 0 0 0  Number falls in past yr: 0 0     Injury with Fall? 1 0     Comment fRACTURED LEG; HX OF OSTEOPORSIS      Risk for fall due to : Impaired balance/gait;Mental status change No Fall Risks     Follow up Falls evaluation completed;Education provided Falls evaluation completed       MEDICARE RISK AT HOME:  Medicare Risk at Home Any stairs in or around the home?: Yes (USES RAMP) If so, are there any without handrails?: No Home free of loose throw rugs in walkways, pet beds, electrical cords, etc?: Yes Adequate lighting in your home to reduce risk of falls?: Yes Life alert?: No Use of a cane, walker or w/c?: Yes Grab bars in the bathroom?: Yes Shower chair or bench in shower?: Yes Elevated toilet seat or a handicapped toilet?: Yes  TIMED UP AND GO:  Was the test performed?  No  Cognitive Function: Impaired: Patient has current diagnosis of cognitive impairment.    11/15/2023    8:55 AM  MMSE - Mini Mental State Exam  Not completed: Unable to complete        Immunizations Immunization History  Administered Date(s) Administered   Influenza Split 04/14/2011, 03/22/2012   Influenza Whole 05/26/2009, 04/04/2010   Influenza, Seasonal, Injecte, Preservative Fre 05/08/2023    Influenza,inj,Quad PF,6+ Mos 04/03/2013, 04/04/2014, 03/08/2015, 03/08/2016, 03/26/2017, 04/12/2018, 03/03/2019, 03/26/2020, 02/18/2021, 07/21/2022   PFIZER(Purple Top)SARS-COV-2 Vaccination 11/10/2019, 12/01/2019, 05/28/2020   Pfizer Covid-19 Vaccine Bivalent Booster 86yrs & up 05/31/2021   Pfizer(Comirnaty)Fall Seasonal Vaccine 12 years and older 05/08/2023   Tdap 01/14/2014    Screening Tests Health Maintenance  Topic Date Due   Pneumococcal Vaccine 10-86 Years old (1 of 2 - PCV) Never done   Cervical Cancer Screening (HPV/Pap Cotest)  Never done   COVID-19 Vaccine (6 - Pfizer risk 2024-25 season) 11/05/2023   DTaP/Tdap/Td (2 - Td or Tdap) 01/15/2024   INFLUENZA VACCINE  01/18/2024   Medicare Annual Wellness (AWV)  11/14/2024   Hepatitis C Screening  Completed   HIV Screening  Completed   HPV VACCINES  Aged Out   Meningococcal B Vaccine  Aged Out    Health Maintenance  Health Maintenance Due  Topic Date Due   Pneumococcal Vaccine 44-90 Years old (1 of 2 - PCV)  Never done   Cervical Cancer Screening (HPV/Pap Cotest)  Never done   COVID-19 Vaccine (6 - Pfizer risk 2024-25 season) 11/05/2023   Health Maintenance Items Addressed: Yes Patient's mother aware of current care gaps.  [facility]IR was verified.  Additional Screening:  Vision Screening: Recommended annual ophthalmology exams for early detection of glaucoma and other disorders of the eye.  Dental Screening: Recommended annual dental exams for proper oral hygiene  Community Resource Referral / Chronic Care Management: CRR required this visit?  No   CCM required this visit?  No   Plan:    I have personally reviewed and noted the following in the patient's chart:   Medical and social history Use of alcohol, tobacco or illicit drugs  Current medications and supplements including opioid prescriptions. Patient is not currently taking opioid prescriptions. Functional ability and status Nutritional status Physical  activity Advanced directives List of other physicians Hospitalizations, surgeries, and ER visits in previous 12 months Vitals Screenings to include cognitive, depression, and falls Referrals and appointments  In addition, I have reviewed and discussed with patient certain preventive protocols, quality metrics, and best practice recommendations. A written personalized care plan for preventive services as well as general preventive health recommendations were provided to patient.   Michele Sheldon, LPN   1/61/0960   After Visit Summary: (Declined) Due to this being a telephonic visit, with patients personalized plan was offered to patient but patient Declined AVS at this time   Notes: Patient's mother aware of current care gaps.  NCIR was verified.

## 2024-01-07 ENCOUNTER — Other Ambulatory Visit: Payer: Self-pay

## 2024-01-07 MED ORDER — FLUTICASONE PROPIONATE 50 MCG/ACT NA SUSP: 2.0000 | Freq: Every day | NASAL | 5 refills | Status: AC

## 2024-02-04 ENCOUNTER — Telehealth: Payer: Self-pay | Admitting: Family Medicine

## 2024-02-04 ENCOUNTER — Other Ambulatory Visit (INDEPENDENT_AMBULATORY_CARE_PROVIDER_SITE_OTHER): Payer: Self-pay | Admitting: Family

## 2024-02-04 DIAGNOSIS — G40319 Generalized idiopathic epilepsy and epileptic syndromes, intractable, without status epilepticus: Secondary | ICD-10-CM

## 2024-02-04 DIAGNOSIS — G40814 Lennox-Gastaut syndrome, intractable, without status epilepticus: Secondary | ICD-10-CM

## 2024-02-04 DIAGNOSIS — G808 Other cerebral palsy: Secondary | ICD-10-CM

## 2024-02-04 DIAGNOSIS — F73 Profound intellectual disabilities: Secondary | ICD-10-CM

## 2024-02-04 DIAGNOSIS — Z993 Dependence on wheelchair: Secondary | ICD-10-CM

## 2024-02-04 DIAGNOSIS — G800 Spastic quadriplegic cerebral palsy: Secondary | ICD-10-CM

## 2024-02-04 DIAGNOSIS — G40812 Lennox-Gastaut syndrome, not intractable, without status epilepticus: Secondary | ICD-10-CM

## 2024-02-04 NOTE — Telephone Encounter (Signed)
 Patients mother came in requesting a order for a new hospital bed

## 2024-02-14 ENCOUNTER — Telehealth: Payer: Self-pay

## 2024-02-14 ENCOUNTER — Telehealth: Payer: Self-pay | Admitting: Family Medicine

## 2024-02-14 NOTE — Telephone Encounter (Signed)
 Debra with [facility] LVM on nurse line in regards to medical records.  She reports in order to fulfill her supply order we will need to send the most recent OV note detailing the need of supplies.   Per chart review, the patient will need an apt to detail the need of supplies.   Patient scheduled for 9/3.  Will fax OV notes to [facility] once completed.

## 2024-02-14 NOTE — Telephone Encounter (Signed)
 I have Michele [name] mother here. She is stating that she needs documentation of Michele [name] having incontinence  and that it needs to be sent to Debra at [facility].   Phone Number- [phone] Fax: [phone]

## 2024-02-14 NOTE — Telephone Encounter (Signed)
 Appointment for patient has been made to update the need for supplies.  Michele [name], CMA

## 2024-02-20 ENCOUNTER — Ambulatory Visit (INDEPENDENT_AMBULATORY_CARE_PROVIDER_SITE_OTHER): Admitting: Student

## 2024-02-20 ENCOUNTER — Encounter: Payer: Self-pay | Admitting: Student

## 2024-02-20 VITALS — BP 103/69 | HR 79

## 2024-02-20 DIAGNOSIS — Z0000 Encounter for general adult medical examination without abnormal findings: Secondary | ICD-10-CM | POA: Diagnosis not present

## 2024-02-20 DIAGNOSIS — Z993 Dependence on wheelchair: Secondary | ICD-10-CM | POA: Diagnosis not present

## 2024-02-20 DIAGNOSIS — G40814 Lennox-Gastaut syndrome, intractable, without status epilepticus: Secondary | ICD-10-CM

## 2024-02-20 DIAGNOSIS — Z23 Encounter for immunization: Secondary | ICD-10-CM

## 2024-02-20 DIAGNOSIS — N3942 Incontinence without sensory awareness: Secondary | ICD-10-CM | POA: Diagnosis not present

## 2024-02-20 DIAGNOSIS — G808 Other cerebral palsy: Secondary | ICD-10-CM | POA: Diagnosis present

## 2024-02-20 NOTE — Patient Instructions (Signed)
 It was great to see you! Thank you for allowing me to participate in your care!   I recommend that you always bring your medications to each appointment as this makes it easy to ensure we are on the correct medications and helps us  not miss when refills are needed.  Our plans for today:  - We will forward our note today to your DME company for incontinence supplies  Take care and seek immediate care sooner if you develop any concerns. Please remember to show up 15 minutes before your scheduled appointment time!  Gladis Church, DO [facility]

## 2024-02-20 NOTE — Assessment & Plan Note (Addendum)
-   DME incontinence supplies - Will route to our nursing team

## 2024-02-20 NOTE — Assessment & Plan Note (Signed)
-   DME incontinence supplies - Will route to our nursing team

## 2024-02-20 NOTE — Progress Notes (Signed)
    SUBJECTIVE:   CHIEF COMPLAINT / HPI:   Cerebral palsy  congenital quadriplegia  generalized convulsive epilepsy Lennox-Gastaut syndrome  urinary incontinence Patient presents today for refill of her incontinence supplies.  Patient is wheelchair-bound, with severe developmental delay and the comorbidities as listed above.  She requires incontinence supplies. No acute concerns today.  OBJECTIVE:   BP 103/69   Pulse 79   SpO2 97%    General: NAD, pleasant Cardio: RRR, no MRG. Cap Refill <2s. Respiratory: CTAB, normal wob on RA GI: Abdomen is soft, not tender, not distended. 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

## 2024-02-22 ENCOUNTER — Telehealth: Payer: Self-pay

## 2024-02-22 NOTE — Telephone Encounter (Signed)
 Received call from [facility] and patient's mother regarding documentation for incontinence supplies.   [facility] is needing documentation from OV on 02/20/24 that states medical need for supplies.   It appears ROI is on file for [facility].   Will forward to Admin team for faxing of medical records.   Thanks.   Michele JAYSON English, RN

## 2024-03-05 ENCOUNTER — Other Ambulatory Visit: Payer: Self-pay | Admitting: Family Medicine

## 2024-03-06 ENCOUNTER — Encounter (INDEPENDENT_AMBULATORY_CARE_PROVIDER_SITE_OTHER): Payer: Self-pay | Admitting: Family

## 2024-03-06 ENCOUNTER — Ambulatory Visit (INDEPENDENT_AMBULATORY_CARE_PROVIDER_SITE_OTHER): Admitting: Family

## 2024-03-06 VITALS — BP 114/72 | HR 80

## 2024-03-06 DIAGNOSIS — F73 Profound intellectual disabilities: Secondary | ICD-10-CM | POA: Diagnosis not present

## 2024-03-06 DIAGNOSIS — Z993 Dependence on wheelchair: Secondary | ICD-10-CM

## 2024-03-06 DIAGNOSIS — I890 Lymphedema, not elsewhere classified: Secondary | ICD-10-CM | POA: Diagnosis not present

## 2024-03-06 DIAGNOSIS — N3942 Incontinence without sensory awareness: Secondary | ICD-10-CM

## 2024-03-06 DIAGNOSIS — G40814 Lennox-Gastaut syndrome, intractable, without status epilepticus: Secondary | ICD-10-CM | POA: Diagnosis not present

## 2024-03-06 DIAGNOSIS — G801 Spastic diplegic cerebral palsy: Secondary | ICD-10-CM

## 2024-03-06 DIAGNOSIS — G40319 Generalized idiopathic epilepsy and epileptic syndromes, intractable, without status epilepticus: Secondary | ICD-10-CM | POA: Diagnosis not present

## 2024-03-06 DIAGNOSIS — R251 Tremor, unspecified: Secondary | ICD-10-CM

## 2024-03-06 MED ORDER — LEVOCARNITINE 1 GM/10ML PO SOLN: ORAL | 5 refills | Status: AC

## 2024-03-06 MED ORDER — BANZEL 40 MG/ML PO SUSP: ORAL | 5 refills | Status: AC

## 2024-03-06 MED ORDER — KEPPRA 100 MG/ML PO SOLN: ORAL | 5 refills | Status: DC

## 2024-03-06 MED ORDER — DEPAKOTE SPRINKLES 125 MG PO CSDR
DELAYED_RELEASE_CAPSULE | ORAL | 5 refills | Status: AC
Start: 2024-03-06 — End: ?

## 2024-03-06 MED ORDER — TOPAMAX SPRINKLE 25 MG PO CPSP: ORAL_CAPSULE | ORAL | 5 refills | Status: AC

## 2024-03-06 NOTE — Progress Notes (Signed)
 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 She is taking and tolerating brand Depakote , Keppra , Topamax  and Banzel  for her seizure disorder. Noralyn typically turns blue and requires blow by oxygen  when seizures occur. She is completely dependent upon others for all activities of daily living. She is cared for at home by her mother.   Due to her medical condition, she is indefinitely incontinent of stool and urine.  It is medically necessary for her to use diapers, underpads, and gloves to assist with hygiene and skin integrity.     Today's concerns: She has remained seizure free since her last visit other than some occasional eye twitches and staring. These behaviors are brief and do not require intervention. Mom denies any equipment needs today [place] has been otherwise generally healthy since she was last seen. No health concerns today other than previously mentioned.  Review of systems: Please see HPI for neurologic and other pertinent review of systems. Otherwise all other systems were reviewed and were negative.  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) 07/06/2017   Lymphedema 01/22/2017   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   Morbid obesity (HCC) 12/24/2012   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   URINARY INCONTINENCE 02/25/2010   Full incontinence of feces 02/25/2010   Hypothyroidism 01/29/2009   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   Cerebral palsy (HCC) 01/29/2009   Osteoporosis 01/29/2009   Seizure disorder (HCC) 01/29/2009     Past Medical History:  Diagnosis Date   Acute hypoxemic respiratory failure due to COVID-19 (HCC) 08/03/2019   Cerebral palsy (HCC)    Hypothyroidism    Lennox-Gastaut syndrome (HCC)    Osteoporosis    Pneumonia    Poor appetite 02/21/2021   Profound mental retardation    Reflux    Rickets    Seizures (HCC)    Upper respiratory symptom 06/02/2019    Past medical history comments: See HPI  Surgical history: Past Surgical History:  Procedure Laterality Date   FOOT MASS EXCISION Right    Gastrocutaneous fistual repair  2012   PEG TUBE PLACEMENT     PEG TUBE REMOVAL  2012 ?    Family history: family history includes Cancer in her maternal aunt; Diabetes in an other family member; Heart disease in her maternal grandmother and another family member; Hypertension in her mother and paternal grandmother; Kidney disease in an other family member; Stroke in her maternal grandfather.   Social history: Social History   Socioeconomic History   Marital status: Single    Spouse name: Not on file  Number of children: 0   Years of education: Not on file   Highest education level: Not on file  Occupational History   Occupation: Disabled   Tobacco Use   Smoking status: Never    Passive exposure: Never   Smokeless tobacco: Never  Vaping Use   Vaping status: Never Used   Substance and Sexual Activity   Alcohol use: No   Drug use: No   Sexual activity: Never  Other Topics Concern   Not on file  Social History Narrative   0 caffeine drinks    Lives with mother who helps ADLs.   Graduated from [employer] in 2010. Now attends day program at [employer].    Enjoys playing in water, ripping paper, watching TV, music and going out.   Social Drivers of Corporate investment banker Strain: Low Risk  (11/15/2023)   Overall Financial Resource Strain (CARDIA)    Difficulty of Paying Living Expenses: Not hard at all  Food Insecurity: No Food Insecurity (11/15/2023)   Hunger Vital Sign    Worried About Running Out of Food in the Last Year: Never true    Ran Out of Food in the Last Year: Never true  Transportation Needs: No Transportation Needs (11/15/2023)   PRAPARE - Administrator, Civil Service (Medical): No    Lack of Transportation (Non-Medical): No  Physical Activity: Inactive (11/15/2023)   Exercise Vital Sign    Days of Exercise per Week: 0 days    Minutes of Exercise per Session: 0 min  Stress: Patient Unable To Answer (11/15/2023)   [employer] of Occupational Health - Occupational Stress Questionnaire    Feeling of Stress : Patient unable to answer  Social Connections: Patient Unable To Answer (11/15/2023)   Social Connection and Isolation Panel    Frequency of Communication with Friends and Family: Patient unable to answer    Frequency of Social Gatherings with Friends and Family: Patient unable to answer    Attends Religious Services: Patient unable to answer    Active Member of Clubs or Organizations: Patient unable to answer    Attends Banker Meetings: Patient unable to answer    Marital Status: Patient unable to answer  Intimate Partner Violence: Not At Risk (11/15/2023)   Humiliation, Afraid, Rape, and Kick questionnaire    Fear of Current or Ex-Partner: No    Emotionally Abused: No    Physically Abused: No     Sexually Abused: No    Past/failed meds: Copied from previous record: Carbamazepine  - rash   Allergies: Allergies  Allergen Reactions   Carbamazepine  Rash    Immunizations: Immunization History  Administered Date(s) Administered   Hep A / Hep B 07/03/1994, 09/01/1994, 12/25/1994   Influenza Split 04/14/2011, 03/22/2012   Influenza Whole 05/26/2009, 04/04/2010   Influenza, Seasonal, Injecte, Preservative Fre 05/08/2023, 02/20/2024   Influenza,inj,Quad PF,6+ Mos 04/03/2013, 04/04/2014, 03/08/2015, 03/08/2016, 03/26/2017, 04/12/2018, 03/03/2019, 03/26/2020, 02/18/2021, 07/21/2022   PFIZER(Purple Top)SARS-COV-2 Vaccination 11/10/2019, 12/01/2019, 05/28/2020   Pfizer Covid-19 Vaccine Bivalent Booster 2yrs & up 05/31/2021   Pfizer(Comirnaty)Fall Seasonal Vaccine 12 years and older 05/08/2023   Tdap 01/14/2014    Diagnostics/Screenings: Copied from previous record: 09/25/2022 - rEEG - This is a abnormal record with the patient in awake states due to global low amplitude activity.  No evidence of epileptic activity throughout the recording. Clinical correlation advised.  Corean Geralds MD MPH   Physical Exam: BP 114/72   Pulse 80   General: well developed, well  nourished obese woman, seated in wheelchair, in no evident distress Head: normocephalic and atraumatic. Oropharynx difficult to examine but appears benign. No dysmorphic features. Neck: supple Cardiovascular: regular rate and rhythm, no murmurs.  Respiratory: clear to auscultation bilaterally Abdomen: bowel sounds present all four quadrants, abdomen soft, non-tender, non-distended.  Musculoskeletal: no skeletal deformities or obvious scoliosis. Has increased tone in the lower extremities and bilateral lymphedema in her lower extremities Skin: no rashes or neurocutaneous lesions  Neurologic Exam Mental Status: awake and fully alert. Has no language.  Smiles responsively. Unable to follow instructions or participate in  examination. Claps her hands frequently Cranial Nerves: fundoscopic exam - red reflex present.  Unable to fully visualize fundus.  Pupils equal briskly reactive to light.  Turns to localize faces and objects in the periphery. Turns to localize sounds in the periphery. Facial movements are symmetric Motor: fairly normal tone in the upper extremities, has increased tone in the lower extremities Sensory: withdrawal x 4 Coordination: unable to adequately assess due to patient's inability to participate in examination. No dysmetria with reach for objects. Gait and Station: unable to stand and bear weight.   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 No recent diagnostic studies to be reviewed with the patient.  Plan until next visit: Continue medications as prescribed  Call for questions or concerns Return in about 6 months (around 09/03/2024).  The medication list was reviewed and reconciled. No changes were made in the prescribed medications today. A complete medication list was provided to the patient.  Allergies as of 03/06/2024       Reactions   Carbamazepine  Rash        Medication List        Accurate as of March 06, 2024 11:59 PM. If you have any questions, ask your nurse or doctor.          STOP taking these medications    topiramate  25 MG capsule Commonly known as: Topamax  Sprinkle Replaced by: Topamax  Sprinkle 25 MG  capsule Stopped by: Ellouise Bollman       TAKE these medications    albuterol  (2.5 MG/3ML) 0.083% nebulizer solution Commonly known as: PROVENTIL  USE 1 VIAL VIA NEBULIZER EVERY 6 HOURS AS NEEDED FOR WHEEZING OR SHORTNESS OF BREATH   Banzel  40 MG/ML Susp Generic drug: Rufinamide  TAKE EVERY MORNING AND 5MLS EVERY NIGHT AT BEDTIME   Calcarb 600/D 600-400 MG-UNIT Tabs Generic drug: Calcium  Carbonate-Vitamin D  Take 1 tablet by mouth daily.   cetirizine  HCl 1 MG/ML solution Commonly known as: ZYRTEC  TAKE 10 ML(10 MG) BY MOUTH DAILY   clotrimazole  1 % vaginal cream Commonly known as: GYNE-LOTRIMIN  Place 1 Applicatorful vaginally at bedtime.   Depakote  Sprinkles 125 MG capsule Generic drug: divalproex  TAKE 4 CAPSULES BY MOUTH TWICE DAILY   esomeprazole  40 MG capsule Commonly known as: NEXIUM  Take 40 mg by mouth daily as needed (acid reflux).   fluticasone  50 MCG/ACT nasal spray Commonly known as: FLONASE  Place 2 sprays into both nostrils daily.   furosemide  20 MG tablet Commonly known as: LASIX   TAKE 1 TABLET BY MOUTH EVERY DAY AS NEEDED FOR SWELLING   Keppra  100 MG/ML solution Generic drug: levETIRAcetam  TAKE 12 MLS BY MOUTH MORNING EVERY MORNING AND AT NIGHT   ketotifen  0.025 % ophthalmic solution Commonly known as: ZADITOR  Place 1 drop into both eyes 2 (two) times daily as needed (eye irritation).   lactose free nutrition Liqd Take 237 mLs by mouth 3 (three) times daily with meals.   lactulose  10 GM/15ML solution Commonly known as: CHRONULAC  TAKE 30 ML BY MOUTH TWICE DAILY   levOCARNitine  1 GM/10ML solution Commonly known as: CARNITOR  TAKE 5 ML BY MOUTH THREE TIMES DAILY   liver oil-zinc  oxide 40 % ointment Commonly known as: DESITIN Apply 1 application  topically daily as needed for irritation.   Nebulizer Mask Adult Misc Use with nebulizer machine when needed   Nebulizer/Pediatric Mask Kit Use with nebulizer machine as needed for intermittent  wheezing and shortness of breath.   nystatin  powder Commonly known as: nystatin  Apply 1 Application topically 3 (three) times daily.   Topamax  Sprinkle 25 MG capsule Generic drug: topiramate  TAKE 6 CAPSULES BY MOUTH EVERY MORNING AND TAKE 6 CAPSULES EVERY EVENING WITH FOOD Replaces: topiramate  25 MG capsule Started by: Ellouise Bollman   Valtoco  20 MG Dose 2 x 10 MG/0.1ML Lqpk Generic drug: diazePAM  (20 MG Dose) GIVE 1 SPRAY IN EACH NOSTRIL FOR SEIZURES LASTING 2 MINUTES OR LONGER   VITAMIN D  PO Take 1 tablet by mouth daily.      Total time spent with the patient was 25 minutes, of which 50% or more was spent in counseling and coordination of care.  Ellouise Bollman NP-C [facility] [address], [address] [place], [place] [address] Ph. [phone] Fax [phone]

## 2024-03-06 NOTE — Patient Instructions (Signed)
 It was a pleasure to see you today!  Instructions for you until your next appointment are as follows: Continue giving Novie's medications as prescribed Let me know if she has seizures or if you have any concerns Please sign up for MyChart if you have not done so. Please plan to return for follow up in 6 months or sooner if needed.  Feel free to contact our office during normal business hours at [phone] with questions or concerns. If there is no answer or the call is outside business hours, please leave a message and our clinic staff will call you back within the next business day.  If you have an urgent concern, please stay on the line for our after-hours answering service and ask for the on-call neurologist.     I also encourage you to use MyChart to communicate with me more directly. If you have not yet signed up for MyChart within [facility], the front desk staff can help you. However, please note that this inbox is NOT monitored on nights or weekends, and response can take up to 2 business days.  Urgent matters should be discussed with the on-call pediatric neurologist.   At [facility], we are committed to providing exceptional care. You will receive a patient satisfaction survey through text or email regarding your visit today. Your opinion is important to me. Comments are appreciated.

## 2024-03-07 ENCOUNTER — Other Ambulatory Visit: Payer: Self-pay

## 2024-03-07 ENCOUNTER — Other Ambulatory Visit (HOSPITAL_COMMUNITY): Payer: Self-pay

## 2024-03-07 MED ORDER — KEPPRA 100 MG/ML PO SOLN
1200.0000 mg | Freq: Two times a day (BID) | ORAL | 5 refills | Status: DC
  Filled 2024-03-07: qty 473, 20d supply, fill #0
  Filled 2024-03-07: qty 247, 10d supply, fill #0

## 2024-03-08 ENCOUNTER — Encounter (INDEPENDENT_AMBULATORY_CARE_PROVIDER_SITE_OTHER): Payer: Self-pay | Admitting: Family

## 2024-03-10 ENCOUNTER — Other Ambulatory Visit (HOSPITAL_COMMUNITY): Payer: Self-pay

## 2024-03-10 ENCOUNTER — Other Ambulatory Visit: Payer: Self-pay

## 2024-03-11 ENCOUNTER — Other Ambulatory Visit (HOSPITAL_COMMUNITY): Payer: Self-pay

## 2024-03-12 ENCOUNTER — Other Ambulatory Visit (HOSPITAL_COMMUNITY): Payer: Self-pay

## 2024-03-31 ENCOUNTER — Other Ambulatory Visit (INDEPENDENT_AMBULATORY_CARE_PROVIDER_SITE_OTHER): Payer: Self-pay | Admitting: Family

## 2024-03-31 ENCOUNTER — Other Ambulatory Visit (HOSPITAL_COMMUNITY): Payer: Self-pay

## 2024-05-19 NOTE — Progress Notes (Unsigned)
    SUBJECTIVE:   CHIEF COMPLAINT / HPI:   Lymphedema - Needs a referral to OT  Cerebral palsy  Lennox-Gastaut syndrome  Generalized Convulsive Epilepsy  Urinary Incontinence - DME: Diapers, wipes, pads, gloves, oxygen , concentrator, hospital bed, Hoyer lift, and walk-in shower - DME has helped her immensely to manage all her conditions  PERTINENT  PMH / PSH: Reviewed  OBJECTIVE:   BP 99/74   Pulse 79   SpO2 95%   General: Awake and Alert in NAD HEENT: NCAT. Sclera anicteric. No rhinorrhea. Cardiovascular: RRR. No M/R/G Respiratory: CTAB, normal WOB on RA. No wheezing, crackles, rhonchi, or diminished breath sounds. Abdomen: Soft, non-tender, non-distended. Bowel sounds normoactive Extremities: Wheelchair bound Skin: Warm and dry.  ASSESSMENT/PLAN:   Assessment & Plan Lymphedema Referral placed for OT with Verneita Dec who she has previously seen, OT had told her that she needs a repeat referral placed for the upcoming year. Elevated vitamin B12 level Vitamin B12 1,269. Was previously on Vitamin B12, which was discontinued.  - Recheck B12 today   [name], DO [facility]

## 2024-05-20 ENCOUNTER — Encounter: Payer: Self-pay | Admitting: Family Medicine

## 2024-05-20 ENCOUNTER — Ambulatory Visit: Admitting: Family Medicine

## 2024-05-20 VITALS — BP 99/74 | HR 79

## 2024-05-20 DIAGNOSIS — I890 Lymphedema, not elsewhere classified: Secondary | ICD-10-CM | POA: Diagnosis present

## 2024-05-20 DIAGNOSIS — R7989 Other specified abnormal findings of blood chemistry: Secondary | ICD-10-CM | POA: Diagnosis not present

## 2024-05-20 MED ORDER — NYSTATIN 100000 UNIT/GM EX POWD: 1.0000 | Freq: Three times a day (TID) | CUTANEOUS | 3 refills | Status: DC

## 2024-05-20 MED ORDER — ESOMEPRAZOLE MAGNESIUM 40 MG PO CPDR: 40.0000 mg | DELAYED_RELEASE_CAPSULE | Freq: Every day | ORAL | 3 refills | Status: AC | PRN

## 2024-05-20 NOTE — Patient Instructions (Signed)
 It was great to see you today! Thank you for choosing [facility] for your primary care. Michele [name] was seen for lymphedema referral.  Today we addressed: Referral to OT placed Recheck Vit B12  If you haven't already, sign up for My Chart to have easy access to your labs results, and communication with your primary care physician.  We are checking some labs today. If they are abnormal, I will call you. If they are normal, I will send you a MyChart message (if it is active) or a letter in the mail. If you do not hear about your labs in the next 2 weeks, please call the office.  You should return to our clinic No follow-ups on file. Please arrive 15 minutes before your appointment to ensure smooth check in process.  We appreciate your efforts in making this happen.  Thank you for allowing me to participate in your care, [name], DO 05/20/2024, 9:55 AM PGY-2, [facility]

## 2024-05-20 NOTE — Assessment & Plan Note (Signed)
 Referral placed for OT with Verneita Dec who she has previously seen, OT had told her that she needs a repeat referral placed for the upcoming year.

## 2024-05-21 ENCOUNTER — Ambulatory Visit: Payer: Self-pay | Admitting: Family Medicine

## 2024-05-21 LAB — VITAMIN B12: Vitamin B-12: 880 pg/mL (ref 232–1245)

## 2024-05-27 ENCOUNTER — Other Ambulatory Visit (HOSPITAL_COMMUNITY): Payer: Self-pay

## 2024-05-27 ENCOUNTER — Telehealth (INDEPENDENT_AMBULATORY_CARE_PROVIDER_SITE_OTHER): Payer: Self-pay

## 2024-05-27 DIAGNOSIS — G40319 Generalized idiopathic epilepsy and epileptic syndromes, intractable, without status epilepticus: Secondary | ICD-10-CM

## 2024-05-27 MED ORDER — LEVETIRACETAM 100 MG/ML PO SOLN: ORAL | 5 refills | Status: AC

## 2024-05-27 NOTE — Telephone Encounter (Signed)
 Received a call from [facility] who informed us  that they are unable to get the Brand Name of Keppra .   The pharmacist stated that she has been trying to find a pharmacy that would be able to fill Brand Name and has been unsuccessful.   She has tried other Therapist, Occupational within a 50 Mile radius and [facility]. Neither of the two pharmacy have been able to fill the full prescription. Mom informed the pharmacist of Michele [name] having a seizure due to two missed doses.   I thanked the pharmacist for this information and ended the call.   I contacted [facility] to see if they would be able to fill the brand name and they are unable to do so. The brand name of this medication is on back order.   This call was discussed verbally with the provider who stated that she will call Sole mother to discuss other options.   SS, CCMA

## 2024-05-27 NOTE — Telephone Encounter (Signed)
 I called Mom to discuss the Keppra  being unavailable. I explained that we will need to fill the prescription with generic Levetiracetam  for now. Mom was reluctant as she says that Michele [name] has had seizures in the past on the generic version. Mom notes that Michele [name] had a seizure this morning in the setting of missed medication. I explained that she needs to start the Levetiracetam  today and increase the dose to 13ml BID. Mom agreed with this plan

## 2024-06-25 ENCOUNTER — Ambulatory Visit: Attending: Family Medicine | Admitting: Occupational Therapy

## 2024-06-25 ENCOUNTER — Encounter: Payer: Self-pay | Admitting: Occupational Therapy

## 2024-06-25 DIAGNOSIS — I890 Lymphedema, not elsewhere classified: Secondary | ICD-10-CM | POA: Insufficient documentation

## 2024-06-25 NOTE — Therapy (Signed)
 " OUTPATIENT OCCUPATIONAL THERAPY LYMPHEDEMA RE-EVALUATION   BLE LYMPHEDEMA   Patient Name: Michele [name] MRN: [id number] DOB:1985/08/21, 39 y.o., female Today's Date: 06/25/2024  PCP: [name], DO REFERRING PROVIDER: Suzann Daring, MD   OT End of Session - 06/25/24 [phone]     Visit Number 1    Number of Visits 4    Date for Recertification  09/23/24    OT Start Time 0800    OT Stop Time 0848    OT Time Calculation (min) 48 min    Activity Tolerance Patient tolerated treatment well;No increased pain;Other (comment)   intellectual delay, non verbal   Behavior During Therapy WFL for tasks assessed/performed           Past Medical History:  Diagnosis Date   Acute hypoxemic respiratory failure due to COVID-19 (HCC) 08/03/2019   Cerebral palsy (HCC)    Hypothyroidism    Lennox-Gastaut syndrome (HCC)    Osteoporosis    Pneumonia    Poor appetite 02/21/2021   Profound mental retardation    Reflux    Rickets    Seizures (HCC)    Upper respiratory symptom 06/02/2019   Past Surgical History:  Procedure Laterality Date   FOOT MASS EXCISION Right    Gastrocutaneous fistual repair  2012   PEG TUBE PLACEMENT     PEG TUBE REMOVAL  2012 ?   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) 07/06/2017   Lymphedema 01/22/2017   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   Morbid obesity (HCC) 12/24/2012    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   URINARY INCONTINENCE 02/25/2010   Full incontinence of feces 02/25/2010   Hypothyroidism 01/29/2009   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   Cerebral palsy (HCC) 01/29/2009   Osteoporosis 01/29/2009   Seizure disorder (HCC) 01/29/2009    ONSET DATE: 07/23/2017  REFERRING DIAG: I89.0  THERAPY DIAG:  Lymphedema, not elsewhere classified  Rationale for Evaluation and Treatment Rehabilitation  PERTINENT HISTORY: BLE lymphedema, profound developmental delay , CP, Lennox-Gastaut syndrome, seizure disorder, hypothyroidism, osteoporosis, rickets, non-verbal   LIMITATIONS: paraplegia, impaired balance, postural impairment,developmental and intellectual delays,increased muscle tone BLE, hx seizures, incontinence. completely dependent for all ADLs   PRECAUTIONS: lymphedema  SUBJECTIVE: Michele [name] returns to OT today for assessment of BLE custom knee length compression garments and re measurement for replacement PRN. Since last seen nearly one year ago Michele [name] suffered a pathological L tibial fracture from a fall at home. Swelling increased during healing course, but is returned to pre-fracture level and managed without pain in custom compression stockings.    PAIN:  Are you having pain? Pt is non-verbal. Does not demonstrate any signs/symptoms of pain  OBJECTIVE:   TODAY'S TREATMENT:  Reviewed recent medical history and discussed Michele [name]'s LE-related concerns Completed initial CLE  custom compression garment assessment and anatomical measurements Pt/ family edu   PATIENT EDUCATION: Pt / family edu for replacing custom compression stockings, insurance issues, DME vendor's role, and fitting Handouts provided for additional  online info. Person educated: Parent Education method: Solicitor, and Handouts Education comprehension: verbalized understanding, returned demonstration, and needs further education   LYMPHEDEMA  HOME PROGRAM: Pt dependent on CG to assist w all Full time waking hours , BLE Jobst ELVAREX, SOFT custom, flat knit knee high compression stockings, ccl 3 ( 34-40 mmHg). Michele [name] endorses 100% compliance BLE Jobst RELAX convoluted knee length HOS devices designed to reduce fibrosis formation and facilitate improved lymphatic circulation during HOS Michele [name] endorses 100% compliance Consistent daily skin care to BLE to limit infection risk Michele [name] endorses 100% compliance Simple self-MLD to BLE. Non-compliant due to parent's time constraints and high burden of care   Plan -06/25/24      Clinical Impression Statement  Leg swelling is well managed today. Skin is mildly dry and flaking superficially at ankles. Fatty fibrosis persists, but remains mild. Pt remains 100% compliant with LE self;f care home program with her Michele [name]'s max assistance.Completed anatomical measurements for new replacements of BLE custom compression knee highs and submitted to DME vendor after session by fax. Pt will return for garment fitting and assessment as soon as they are available from manufacturer.   3 pair of Custom, bilateral lower extremity, knee length Jobst, ELVAREX, SOFT,  flat knit compression stockings, ccl 3 ( 34-40 mmHg) are medically necessary to control chronic , BLE lymphedema and to limit progression and further functional decline.    Custom-made gradient compression garments and HOS devices are medically necessary because they are uniquely sized and shaped to fit the exact dimensions of the affected extremities, and to provide appropriate medical grade, graduated compression essential for optimally managing chronic, progressive lymphedema. Multiple custom compression garments are needed to ensure proper hygiene to limit infection risk. Custom compression garments should be replaced q 3-6 months When worn consistently for optimal lipo-lymphedema self-management over time. HOS devices, medically necessary to limit  fibrosis buildup in tissue, should be replaced q 2 years and PRN when worn out.       Cont as per POC.     OT Occupational Profile and History Comprehensive Assessment- Review of records and extensive additional review of physical, cognitive, psychosocial history related to current functional performance     Occupational performance deficits (Please refer to evaluation for details): ADL's;IADL's;Rest and Sleep;Work;Leisure;Play;Social Participation     Body Structure / Function / Physical Skills ADL;Decreased knowledge of use of DME;Gait;Obesity;Strength;Balance;Dexterity;Pain;Tone;Edema;UE functional use;ROM;IADL;Endurance;Continence;Coordination;Flexibility;Mobility;Sensation;Skin integrity;Decreased knowledge of precautions     Rehab Potential Good     Clinical Decision Making Multiple treatment options, significant modification of task necessary     Co morbidities Affecting Occupational Performance: May have co morbidities impacting occupational performance     Modification or Assistance to Complete Evaluation  Max significant modification of tasks or assist is necessary to complete     OT Frequency Other (comment)   Return for custom compression garment fitting ASAP. This may require multiple visits if garments need adjustment and remakes. Estimate 3 return visits to cover possible remeasurments and refitting    OT Treatment/Interventions Self-care/ADL training;DME and/or AE instruction;Therapeutic activities   anatomical measurement, specifications,and  fitting for custom compression garments and devices    Plan fit replacements for custom daytime compression garments, including BLE, custom, knee length, Jobst, Elvarex SOFT, ccl 3 ( 34-46 mmHg) ; open toe, t heel, 2.5 SB on top, oblique top edge     Recommended Other Services Trial with Flexitouch advanced sequential pneumatic compression device, or pump, in clinic to see if [place] is able to tolerate  1 hour cycle with device on one full lower  extremity and abdomen to be able to undergo full cycle of treatment.   Although more expensive than the basic sequential pump, the advanced Flexitouch device uses 32 sequential chambers and follows lymphatic anatomy to decongest lymphatic fluid and protein from proximal to distal, then above the inguinal lymph nodes towards deeper abdominal lymphatics,and  the deep thoracic duct. The basic device does not follow lymphatic anatomy. It provides sequential pressure from distal to proximal in a retrograde sequence against back pressure from lymphatic congestion in limb segments above, and uses only 4-8 chambers. Basic pumps cannot be calibrated to ensure correct compression.     Consulted and Agree with Plan of Care Family member/caregiver;Patient                   Patient will benefit from skilled therapeutic intervention in order to improve the following deficits and impairments:   Body Structure / Function / Physical Skills: ADL, Decreased knowledge of use of DME, Gait, Obesity, Strength, Balance, Dexterity, Pain, Tone, Edema, UE functional use, ROM, IADL, Endurance, Continence, Coordination, Flexibility, Mobility, Sensation, Skin integrity, Decreased knowledge of precautions     Visit Diagnosis: Lymphedema, not elsewhere classified  Zebedee Dec, MS, OTR/L, CLT-LANA 06/25/2024 8:59 AM            "

## 2024-07-07 ENCOUNTER — Ambulatory Visit: Admitting: Student

## 2024-07-07 VITALS — BP 103/63 | HR 86 | Temp 98.2°F | Ht 59.0 in

## 2024-07-07 DIAGNOSIS — J309 Allergic rhinitis, unspecified: Secondary | ICD-10-CM | POA: Diagnosis present

## 2024-07-07 DIAGNOSIS — N39498 Other specified urinary incontinence: Secondary | ICD-10-CM

## 2024-07-07 MED ORDER — NYSTATIN 100000 UNIT/GM EX POWD: 1.0000 | Freq: Three times a day (TID) | CUTANEOUS | 3 refills | Status: AC

## 2024-07-07 MED ORDER — CETIRIZINE HCL 1 MG/ML PO SOLN: ORAL | 6 refills | Status: AC

## 2024-07-07 NOTE — Patient Instructions (Signed)
 It was great to see you! Thank you for allowing me to participate in your care!   I recommend that you always bring your medications to each appointment as this makes it easy to ensure we are on the correct medications and helps us  not miss when refills are needed.   VISIT SUMMARY: Today we discussed your respiratory symptoms, which are likely related to allergies.  YOUR PLAN: ALLERGIC RHINITIS: You have intermittent grunting and nasal congestion likely due to allergic rhinitis. -Refilled Zyrtec  for allergy management. -Continue using Flonase  nasal spray. -Use saline nasal spray as needed. -Utilize a humidifier at home.  URINARY INCONTINENCE: You have chronic urinary incontinence without sensory awareness and use of briefs which can lead to rash -Continue managing urinary incontinence as discussed. - I refilled your nystatin  cream  Take care and seek immediate care sooner if you develop any concerns. Please remember to show up 15 minutes before your scheduled appointment time!  Gladis Church, DO [facility]

## 2024-07-07 NOTE — Progress Notes (Signed)
" ° ° °  SUBJECTIVE:   CHIEF COMPLAINT / HPI:   Discussed the use of AI scribe software for clinical note transcription with the patient, who gave verbal consent to proceed.  History of Present Illness Michele [name] is a 39 year old female with pertinent medical history of Lennox-Gastaut Syndrome, seizure disorder, spastic diplegia, cerebral palsy who presents with respiratory symptoms. She is accompanied by her mother who provides history  Respiratory symptoms - Grunting with normal work of breathing - Occasional cough - Oxygen  levels have been good - Eating well - No fever - No other cold symptoms  Allergic symptoms and management - History of allergies - Currently using Flonase  - No recent use of oral antihistamines, cough medicines, or inhaler treatments  - Oxygen  levels have been good - Eating well   OBJECTIVE:   BP 103/63   Pulse 86   Temp 98.2 F (36.8 C)   Ht 4' 11 (1.499 m)   SpO2 95%   BMI 42.41 kg/m    Physical Exam HEENT: Throat without ulcers or erythema.  Normal conjunctiva.  Pale/boggy nasal turbinates.  Normal external ear, canal, TMs bilaterally. CHEST: Lungs clear to auscultation, referred upper airway sounds heard. No wheezing, rales, or crackles. Cardiac: RRR, no murmurs  ASSESSMENT/PLAN:   Assessment & Plan Allergic rhinitis, unspecified seasonality, unspecified trigger Differential: Allergic rhinitis, viral URI.  Very low concern for LRI given exam, and vitals. - Continue Flonase , refill Zyrtec  - Other supportive care measures discussed - Follow-up as symptoms worsen or fail to improve Other urinary incontinence - Refill nystatin  cream - Continue current DME for urinary incontinence    Gladis Church, DO [facility]  "

## 2024-07-07 NOTE — Assessment & Plan Note (Signed)
-   Refill nystatin  cream - Continue current DME for urinary incontinence

## 2024-07-10 ENCOUNTER — Telehealth (INDEPENDENT_AMBULATORY_CARE_PROVIDER_SITE_OTHER): Payer: Self-pay

## 2024-07-10 NOTE — Telephone Encounter (Signed)
 Received a PA request for the following Medications.   - Banzel  40 MG/mL - Keppra  100 MG/mL - Topamax  25 MG  SS, CCMA

## 2024-07-11 ENCOUNTER — Telehealth (INDEPENDENT_AMBULATORY_CARE_PROVIDER_SITE_OTHER): Payer: Self-pay | Admitting: Pharmacy Technician

## 2024-07-11 ENCOUNTER — Other Ambulatory Visit (HOSPITAL_COMMUNITY): Payer: Self-pay

## 2024-07-11 ENCOUNTER — Encounter (HOSPITAL_COMMUNITY): Payer: Self-pay | Admitting: Emergency Medicine

## 2024-07-11 ENCOUNTER — Ambulatory Visit (HOSPITAL_COMMUNITY)
Admission: EM | Admit: 2024-07-11 | Discharge: 2024-07-11 | Disposition: A | Discharge status: Home / Self Care | Attending: Internal Medicine | Admitting: Internal Medicine

## 2024-07-11 DIAGNOSIS — L03115 Cellulitis of right lower limb: Secondary | ICD-10-CM

## 2024-07-11 MED ORDER — FLUCONAZOLE 150 MG PO TABS: 150.0000 mg | ORAL_TABLET | Freq: Every day | ORAL | 0 refills | Status: AC

## 2024-07-11 MED ORDER — AMOXICILLIN-POT CLAVULANATE 400-57 MG/5ML PO SUSR: 800.0000 mg | Freq: Two times a day (BID) | ORAL | 0 refills | Status: AC

## 2024-07-11 MED ORDER — MUPIROCIN 2 % EX OINT: 1.0000 | TOPICAL_OINTMENT | Freq: Two times a day (BID) | CUTANEOUS | 1 refills | Status: AC

## 2024-07-11 NOTE — ED Triage Notes (Addendum)
 Pt's mother st's pt hit her foot on a step ladder 2 days ago.  Pt has abrasion to right great toe and swelling to top of foot

## 2024-07-11 NOTE — Telephone Encounter (Signed)
 Pharmacy Patient Advocate Encounter  Received notification from [employer] that Prior Authorization for Banzel  40MG /ML suspension  has been APPROVED from 06/19/24 to 06/18/25. Unable to obtain price due to refill too soon rejection, last fill date 06/26/24 next available fill date02/16/26   PA #/Case ID/Reference #: [id number]

## 2024-07-11 NOTE — Telephone Encounter (Addendum)
 Pharmacy Patient Advocate Encounter   Received notification from Pt Calls Messages that prior authorization for levETIRAcetam  100MG /ML solution  is required/requested.   Insurance verification completed.   The patient is insured through [place].   Per test claim: PA required; PA submitted to above mentioned insurance via Latent Key/confirmation #/EOC [facility] Status is pending   **PA was sent for generic, may have to resubmit for brand once the determination comes back.**

## 2024-07-11 NOTE — Telephone Encounter (Signed)
 Pharmacy Patient Advocate Encounter   Received notification from Pt Calls Messages that prior authorization for Topamax  Sprinkle 25MG  sprinkle capsules  is required/requested.   Insurance verification completed.   The patient is insured through [place].   Per test claim: PA required; PA submitted to above mentioned insurance via Latent Key/confirmation #/EOC [id number] Status is pending

## 2024-07-11 NOTE — Telephone Encounter (Signed)
 Pas have to be sent, but I may have to redo the Keppra  one. I submitted it as generic because the Rx didn't have DAW1 on it. I ended up seeing it afterwards in the dispense history and in the notes.

## 2024-07-11 NOTE — Telephone Encounter (Signed)
 Pharmacy Patient Advocate Encounter  Received notification from [employer] that Prior Authorization for KEPPRA  100MG /ML solution  has been DENIED.  **No PA needed. Previous PA doesn't expired until 06/18/25**    PA #/Case ID/Reference #: [id number]

## 2024-07-11 NOTE — Telephone Encounter (Signed)
 Pharmacy Patient Advocate Encounter   Received notification from Pt Calls Messages that prior authorization for Banzel  40MG /ML suspension  is required/requested.   Insurance verification completed.   The patient is insured through [place].   Per test claim: PA required; PA submitted to above mentioned insurance via Latent Key/confirmation #/EOC [facility] Status is pending

## 2024-07-11 NOTE — Telephone Encounter (Signed)
 Clinical questions have been answered and PA submitted. PA currently Pending. Please be advised that most companies allow up to 30 days to make a decision. We will advise when a determination has been made, or follow up in 1 week.   Please reach out to our team, Rx Prior Auth Pool, if you haven't heard back in a week.

## 2024-07-11 NOTE — ED Provider Notes (Signed)
 " [facility]    CSN: [id number] Arrival date & time: 07/11/24  0803      History   Chief Complaint Chief Complaint  Patient presents with   Foot Injury    HPI Michele [name] is a 39 y.o. female.   39 year old female who is brought to urgent care by her caregiver secondary to swelling on the right foot and abrasion.  She reports that 2 days ago she hit her foot on a stepladder.  This area has now become swollen and painful.  She has not had any fevers.  No nausea or vomiting.  She has otherwise been in her normal state of health.   Foot Injury Associated symptoms: no back pain and no fever     Past Medical History:  Diagnosis Date   Acute hypoxemic respiratory failure due to COVID-19 (HCC) 08/03/2019   Cerebral palsy (HCC)    Hypothyroidism    Lennox-Gastaut syndrome (HCC)    Osteoporosis    Pneumonia    Poor appetite 02/21/2021   Profound mental retardation    Reflux    Rickets    Seizures (HCC)    Upper respiratory symptom 06/02/2019    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) 07/06/2017   Lymphedema 01/22/2017   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    OB History   No obstetric history on file.      Home Medications    Prior to Admission medications  Medication Sig Start Date End Date Taking? Authorizing Provider  amoxicillin -clavulanate (AUGMENTIN ) 400-57 MG/5ML suspension Take 10 mLs (800 mg total) by mouth 2 (two) times daily for 7 days. 07/11/24 07/18/24 Yes Nakeyia Menden A, PA-C  fluconazole  (DIFLUCAN ) 150 MG tablet Take 1 tablet (150 mg total) by mouth daily for 2 days. take 1 tablet on 2nd day of antibiotics and then repeat in 3 days 07/11/24 07/13/24 Yes Kerron Sedano A, PA-C  mupirocin ointment (BACTROBAN) 2 % Apply 1 Application topically 2 (two) times daily. 07/11/24  Yes Gaynell Eggleton A, PA-C  albuterol  (PROVENTIL ) (2.5 MG/3ML) 0.083% nebulizer solution USE 1 VIAL VIA NEBULIZER EVERY 6 HOURS AS NEEDED FOR WHEEZING OR SHORTNESS OF BREATH 11/14/23   Janna Ferrier, DO  BANZEL  40 MG/ML SUSP TAKE EVERY MORNING AND 5MLS EVERY NIGHT AT BEDTIME 03/06/24   Marianna City, NP  Calcium  Carbonate-Vitamin D  (CALCARB 600/D) 600-400 MG-UNIT tablet Take 1 tablet by mouth daily.    [provider]  cetirizine  HCl (ZYRTEC ) 1 MG/ML solution TAKE 10 ML(10 MG) BY MOUTH DAILY 07/07/24   Howell Lunger, DO  clotrimazole  (GYNE-LOTRIMIN ) 1 % vaginal cream Place 1 Applicatorful vaginally at bedtime. Patient taking differently: Place 1 Applicatorful vaginally at bedtime. PRN with antibiotics for yeast infection 10/17/23   Bryan Bianchi, MD  DEPAKOTE  SPRINKLES 125 MG capsule TAKE 4 CAPSULES BY MOUTH TWICE DAILY 03/06/24   Marianna City, NP  esomeprazole  (NEXIUM ) 40 MG capsule Take 1 capsule (40 mg total) by mouth daily as needed (acid reflux).  05/20/24   Gomes, Adriana, DO  fluticasone  (FLONASE ) 50 MCG/ACT nasal spray Place 2 sprays into both nostrils daily. 01/07/24   Gomes, Adriana, DO  furosemide  (LASIX ) 20 MG tablet TAKE 1 TABLET BY MOUTH EVERY DAY AS NEEDED FOR SWELLING 09/17/23   Janna Ferrier, DO  ketotifen  (ZADITOR ) 0.025 % ophthalmic solution Place 1 drop into both eyes 2 (two) times daily as needed (eye irritation). 08/22/21   [provider]  lactose free nutrition (BOOST PLUS) LIQD Take 237 mLs by mouth 3 (three) times daily with meals. 03/14/22   Austin Ade, MD  lactulose  (CHRONULAC ) 10 GM/15ML solution TAKE 30 ML BY MOUTH TWICE DAILY 03/05/24   Janna Ferrier, DO  levETIRAcetam  (KEPPRA ) 100 MG/ML solution TAKE 13 MLS BY MOUTH MORNING EVERY MORNING AND AT NIGHT 05/27/24   Marianna City, NP  levOCARNitine  (CARNITOR ) 1 GM/10ML solution TAKE 5 ML BY MOUTH THREE TIMES DAILY 03/06/24   Marianna City, NP  liver oil-zinc  oxide (DESITIN) 40 % ointment Apply 1 application  topically daily as needed for irritation.    [provider]  nystatin  powder Apply 1 Application topically 3 (three) times daily. 07/07/24   Howell Lunger, DO  Respiratory Therapy Supplies (NEBULIZER MASK ADULT) MISC Use with nebulizer machine when needed 07/21/22   Macario Dorothyann HERO, MD  Respiratory Therapy Supplies (NEBULIZER/PEDIATRIC MASK) KIT Use with nebulizer machine as needed for intermittent wheezing and shortness of breath. 08/22/22   Macario Dorothyann HERO, MD  TOPAMAX  SPRINKLE 25 MG capsule TAKE 6 CAPSULES BY MOUTH EVERY MORNING AND TAKE 6 CAPSULES EVERY EVENING WITH FOOD 03/06/24   Marianna City, NP  VALTOCO  20 MG DOSE 2 x 10 MG/0.1ML LQPK GIVE 1 SPRAY IN EACH NOSTRIL FOR SEIZURES LASTING 2 MINUTES OR LONGER 02/04/24   Marianna City, NP  VITAMIN D  PO Take 1 tablet by mouth daily.    [provider]    Family History Family History  Problem Relation Age of Onset   Hypertension Mother    Cancer Maternal Aunt        lung    Heart disease Maternal Grandmother        Died at 84   Stroke Maternal Grandfather        died at 38   Hypertension Paternal Grandmother    Diabetes Other        MGGM   Heart disease Other        MGGF   Kidney disease Other        Maternal Great Aunt    Colon cancer Neg Hx     Social History Social History[1]   Allergies   Carbamazepine    Review of Systems Review of Systems  Constitutional:  Negative for chills and fever.  HENT:  Negative for ear pain and sore throat.   Eyes:  Negative for pain and visual disturbance.  Respiratory:  Negative for cough and shortness of breath.   Cardiovascular:  Negative for chest pain and  palpitations.  Gastrointestinal:  Negative for abdominal pain and vomiting.  Genitourinary:  Negative for dysuria and hematuria.  Musculoskeletal:  Negative for arthralgias and back pain.  Skin:  Positive for wound. Negative for color change and rash.  Neurological:  Negative for seizures and syncope.  All other systems reviewed and are negative.    Physical Exam Triage Vital Signs ED Triage Vitals  Encounter Vitals Group     BP --      Girls Systolic BP Percentile --      Girls Diastolic BP Percentile --      Boys Systolic BP Percentile --      Boys Diastolic BP Percentile --      Pulse --      Resp --      Temp 07/11/24 0820 97.6 F (36.4 C)     Temp Source 07/11/24 0820 Axillary     SpO2 --      Weight --      Height --      Head Circumference --      Peak Flow --      Pain Score 07/11/24 0817 0     Pain Loc --      Pain Education --      Exclude from Growth Chart --    No data found.  Updated Vital Signs Temp 97.6 F (36.4 C) (Axillary)   Visual Acuity Right Eye Distance:   Left Eye Distance:   Bilateral Distance:    Right Eye Near:   Left Eye Near:    Bilateral Near:     Physical Exam Vitals and nursing note reviewed.  Constitutional:      General: She is not in acute distress.    Appearance: She is  well-developed.  HENT:     Head: Normocephalic and atraumatic.  Eyes:     Conjunctiva/sclera: Conjunctivae normal.  Cardiovascular:     Rate and Rhythm: Normal rate.     Heart sounds: No murmur heard. Pulmonary:     Effort: Pulmonary effort is normal. No respiratory distress.  Abdominal:     Palpations: Abdomen is soft.     Tenderness: There is no abdominal tenderness.  Musculoskeletal:        General: No swelling.       Feet:  Skin:    General: Skin is warm and dry.     Capillary Refill: Capillary refill takes less than 2 seconds.  Neurological:     Mental Status: She is alert.  Psychiatric:        Mood and Affect: Mood normal.      UC Treatments / Results  Labs (all labs ordered are listed, but only abnormal results are displayed) Labs Reviewed - No data to display  EKG   Radiology No results found.  Procedures Procedures (including critical care time)  Medications Ordered in UC Medications - No data to display  Initial Impression / Assessment and Plan / UC Course  I have reviewed the triage vital signs and the nursing notes.  Pertinent labs & imaging results that were available during my care of the patient were reviewed by me and considered in my medical decision making (see chart for details).     Cellulitis of right foot   Symptoms and physical exam findings are consistent with cellulitis of the right foot with a small abrasions as well. We will treat with both topical and antibiotics by mouth. We recommend the following:    Augmentin  10 mls twice  daily for 7 days.  This is an antibiotic.  Take this with food.  Diflucan  150 mg take 1 tablet on 2nd day of antibiotics and then repeat in 3 days.  Mupirocin ointment twice daily to the affected area until healed. Can wear post-op shoe to protect the area until healed Make sure to stay hydrated by drinking plenty of water. Return to urgent care or PCP if symptoms worsen or fail to resolve.    Final  Clinical Impressions(s) / UC Diagnoses   Final diagnoses:  Cellulitis of right foot     Discharge Instructions      Symptoms and physical exam findings are consistent with cellulitis of the right foot with a small abrasions as well. We will treat with both topical and antibiotics by mouth. We recommend the following:    Augmentin  10 mls twice daily for 7 days.  This is an antibiotic.  Take this with food.  Diflucan  150 mg take 1 tablet on 2nd day of antibiotics and then repeat in 3 days.  Mupirocin ointment twice daily to the affected area until healed. Can wear post-op shoe to protect the area until healed Make sure to stay hydrated by drinking plenty of water. Return to urgent care or PCP if symptoms worsen or fail to resolve.      ED Prescriptions     Medication Sig Dispense Auth. Provider   amoxicillin -clavulanate (AUGMENTIN ) 400-57 MG/5ML suspension Take 10 mLs (800 mg total) by mouth 2 (two) times daily for 7 days. 140 mL Alexandro Line A, PA-C   fluconazole  (DIFLUCAN ) 150 MG tablet Take 1 tablet (150 mg total) by mouth daily for 2 days. take 1 tablet on 2nd day of antibiotics and then repeat in 3 days 2 tablet Nivan Melendrez A, PA-C   mupirocin ointment (BACTROBAN) 2 % Apply 1 Application topically 2 (two) times daily. 22 g Teresa Almarie LABOR, [place]      PDMP not reviewed this encounter.    [1]  Social History Tobacco Use   Smoking status: Never    Passive exposure: Never   Smokeless tobacco: Never  Vaping Use   Vaping status: Never Used  Substance Use Topics   Alcohol use: No   Drug use: No     Teresa Almarie LABOR, PA-C 07/11/24 [id number]  "

## 2024-07-11 NOTE — Telephone Encounter (Signed)
 Pharmacy Patient Advocate Encounter  Received notification from [employer] that Prior Authorization for Topamax  Sprinkle 25MG  sprinkle capsules  has been APPROVED from 06/19/24 to 06/18/25. Unable to obtain price due to refill too soon rejection, last fill date 06/25/24 next available fill date 07/18/24   PA #/Case ID/Reference #: [id number]

## 2024-07-11 NOTE — Discharge Instructions (Addendum)
 Symptoms and physical exam findings are consistent with cellulitis of the right foot with a small abrasions as well. We will treat with both topical and antibiotics by mouth. We recommend the following:    Augmentin  10 mls twice daily for 7 days.  This is an antibiotic.  Take this with food.  Diflucan  150 mg take 1 tablet on 2nd day of antibiotics and then repeat in 3 days.  Mupirocin ointment twice daily to the affected area until healed. Can wear post-op shoe to protect the area until healed Make sure to stay hydrated by drinking plenty of water. Return to urgent care or PCP if symptoms worsen or fail to resolve.

## 2024-07-11 NOTE — Telephone Encounter (Signed)
 New PA started for [facility] name Keppra .   Per test claim: PA required; PA started via CoverMyMeds. KEY [id number] . Waiting for clinical questions to populate.

## 2024-07-14 ENCOUNTER — Ambulatory Visit: Admitting: Occupational Therapy

## 2024-07-16 ENCOUNTER — Other Ambulatory Visit: Payer: Self-pay | Admitting: Family Medicine

## 2024-07-16 DIAGNOSIS — R600 Localized edema: Secondary | ICD-10-CM

## 2024-07-21 ENCOUNTER — Ambulatory Visit: Admitting: Occupational Therapy

## 2024-07-24 ENCOUNTER — Encounter: Admitting: Orthopedic Surgery

## 2024-09-04 ENCOUNTER — Ambulatory Visit (INDEPENDENT_AMBULATORY_CARE_PROVIDER_SITE_OTHER): Admitting: Family

## 2024-11-17 ENCOUNTER — Encounter
